# Patient Record
Sex: Male | Born: 1955 | Race: White | Hispanic: No | Marital: Married | Smoking: Former smoker
Health system: Southern US, Community
[De-identification: ages and names within clinical notes are randomized; demographics above are authoritative.]

## PROBLEM LIST (undated history)

## (undated) DIAGNOSIS — I878 Other specified disorders of veins: Secondary | ICD-10-CM

## (undated) DIAGNOSIS — I639 Cerebral infarction, unspecified: Secondary | ICD-10-CM

## (undated) DIAGNOSIS — K5792 Diverticulitis of intestine, part unspecified, without perforation or abscess without bleeding: Secondary | ICD-10-CM

## (undated) DIAGNOSIS — G8250 Quadriplegia, unspecified: Secondary | ICD-10-CM

## (undated) DIAGNOSIS — N189 Chronic kidney disease, unspecified: Secondary | ICD-10-CM

## (undated) DIAGNOSIS — K651 Peritoneal abscess: Secondary | ICD-10-CM

## (undated) DIAGNOSIS — G709 Myoneural disorder, unspecified: Secondary | ICD-10-CM

## (undated) DIAGNOSIS — C801 Malignant (primary) neoplasm, unspecified: Secondary | ICD-10-CM

## (undated) DIAGNOSIS — R112 Nausea with vomiting, unspecified: Secondary | ICD-10-CM

## (undated) DIAGNOSIS — Z9889 Other specified postprocedural states: Secondary | ICD-10-CM

## (undated) DIAGNOSIS — A0472 Enterocolitis due to Clostridium difficile, not specified as recurrent: Secondary | ICD-10-CM

## (undated) DIAGNOSIS — Z860100 Personal history of colon polyps, unspecified: Secondary | ICD-10-CM

## (undated) DIAGNOSIS — IMO0002 Reserved for concepts with insufficient information to code with codable children: Secondary | ICD-10-CM

## (undated) DIAGNOSIS — J189 Pneumonia, unspecified organism: Secondary | ICD-10-CM

## (undated) DIAGNOSIS — R7303 Prediabetes: Secondary | ICD-10-CM

## (undated) DIAGNOSIS — D509 Iron deficiency anemia, unspecified: Secondary | ICD-10-CM

## (undated) DIAGNOSIS — K9403 Colostomy malfunction: Secondary | ICD-10-CM

## (undated) DIAGNOSIS — K922 Gastrointestinal hemorrhage, unspecified: Secondary | ICD-10-CM

## (undated) DIAGNOSIS — K269 Duodenal ulcer, unspecified as acute or chronic, without hemorrhage or perforation: Secondary | ICD-10-CM

## (undated) DIAGNOSIS — I10 Essential (primary) hypertension: Secondary | ICD-10-CM

## (undated) DIAGNOSIS — T8131XA Disruption of external operation (surgical) wound, not elsewhere classified, initial encounter: Secondary | ICD-10-CM

## (undated) DIAGNOSIS — D352 Benign neoplasm of pituitary gland: Secondary | ICD-10-CM

## (undated) HISTORY — DX: Diverticulitis of intestine, part unspecified, without perforation or abscess without bleeding: K57.92

## (undated) HISTORY — DX: Benign neoplasm of pituitary gland: D35.2

## (undated) HISTORY — DX: Reserved for concepts with insufficient information to code with codable children: IMO0002

## (undated) HISTORY — DX: Cerebral infarction, unspecified: I63.9

## (undated) HISTORY — DX: Other specified disorders of veins: I87.8

## (undated) HISTORY — PX: CHOLECYSTECTOMY: SHX55

## (undated) HISTORY — DX: Iron deficiency anemia, unspecified: D50.9

## (undated) HISTORY — DX: Essential (primary) hypertension: I10

## (undated) HISTORY — DX: Personal history of colon polyps, unspecified: Z86.0100

## (undated) HISTORY — DX: Personal history of colonic polyps: Z86.010

## (undated) HISTORY — DX: Colostomy malfunction: K94.03

## (undated) HISTORY — PX: OTHER SURGICAL HISTORY: SHX169

---

## 1982-08-11 HISTORY — PX: VASECTOMY: SHX75

## 1992-08-11 DIAGNOSIS — I639 Cerebral infarction, unspecified: Secondary | ICD-10-CM

## 1992-08-11 HISTORY — DX: Cerebral infarction, unspecified: I63.9

## 1993-02-08 HISTORY — PX: TRACHEOSTOMY TUBE PLACEMENT: SHX814

## 1993-02-08 HISTORY — PX: OTHER SURGICAL HISTORY: SHX169

## 2005-08-11 HISTORY — PX: PITUITARY SURGERY: SHX203

## 2005-08-11 LAB — HM COLONOSCOPY: HM Colonoscopy: NORMAL

## 2007-12-09 ENCOUNTER — Encounter: Admission: RE | Admit: 2007-12-09 | Discharge: 2007-12-09 | Payer: Self-pay | Admitting: Family Medicine

## 2008-05-29 ENCOUNTER — Encounter: Payer: Self-pay | Admitting: Family Medicine

## 2008-10-16 ENCOUNTER — Ambulatory Visit: Payer: Self-pay | Admitting: Family Medicine

## 2008-10-16 DIAGNOSIS — I639 Cerebral infarction, unspecified: Secondary | ICD-10-CM

## 2008-10-16 DIAGNOSIS — L218 Other seborrheic dermatitis: Secondary | ICD-10-CM

## 2008-10-16 DIAGNOSIS — N319 Neuromuscular dysfunction of bladder, unspecified: Secondary | ICD-10-CM | POA: Insufficient documentation

## 2008-10-16 DIAGNOSIS — I10 Essential (primary) hypertension: Secondary | ICD-10-CM

## 2008-10-16 DIAGNOSIS — Z860100 Personal history of colon polyps, unspecified: Secondary | ICD-10-CM | POA: Insufficient documentation

## 2008-10-16 HISTORY — DX: Cerebral infarction, unspecified: I63.9

## 2009-01-15 ENCOUNTER — Ambulatory Visit: Payer: Self-pay | Admitting: Family Medicine

## 2009-01-15 DIAGNOSIS — R609 Edema, unspecified: Secondary | ICD-10-CM

## 2009-01-15 DIAGNOSIS — D497 Neoplasm of unspecified behavior of endocrine glands and other parts of nervous system: Secondary | ICD-10-CM

## 2009-01-16 LAB — CONVERTED CEMR LAB
BUN: 12 mg/dL (ref 6–23)
CO2: 29 meq/L (ref 19–32)
Calcium: 9.3 mg/dL (ref 8.4–10.5)
Chloride: 108 meq/L (ref 96–112)
Creatinine, Ser: 0.7 mg/dL (ref 0.4–1.5)
GFR calc non Af Amer: 124.76 mL/min (ref >60)
Glucose, Bld: 144 mg/dL — ABNORMAL HIGH (ref 70–99)
Potassium: 4 meq/L (ref 3.5–5.1)
Sodium: 141 meq/L (ref 135–145)

## 2009-01-18 ENCOUNTER — Encounter: Payer: Self-pay | Admitting: Family Medicine

## 2009-01-19 ENCOUNTER — Encounter: Admission: RE | Admit: 2009-01-19 | Discharge: 2009-01-19 | Payer: Self-pay | Admitting: Family Medicine

## 2009-02-07 ENCOUNTER — Encounter: Payer: Self-pay | Admitting: Family Medicine

## 2009-03-02 ENCOUNTER — Encounter: Payer: Self-pay | Admitting: Family Medicine

## 2009-04-18 ENCOUNTER — Ambulatory Visit: Payer: Self-pay | Admitting: Family Medicine

## 2009-04-19 LAB — CONVERTED CEMR LAB: Glucose, Bld: 116 mg/dL — ABNORMAL HIGH (ref 70–99)

## 2009-05-14 ENCOUNTER — Ambulatory Visit: Payer: Self-pay | Admitting: Family Medicine

## 2009-05-16 ENCOUNTER — Encounter: Payer: Self-pay | Admitting: Family Medicine

## 2009-07-31 ENCOUNTER — Telehealth: Payer: Self-pay | Admitting: Family Medicine

## 2009-08-30 ENCOUNTER — Encounter: Payer: Self-pay | Admitting: Family Medicine

## 2009-11-12 ENCOUNTER — Ambulatory Visit: Payer: Self-pay | Admitting: Family Medicine

## 2009-11-15 LAB — CONVERTED CEMR LAB
Calcium: 9.2 mg/dL (ref 8.4–10.5)
GFR calc non Af Amer: 148.6 mL/min (ref >60)
Glucose, Bld: 120 mg/dL — ABNORMAL HIGH (ref 70–99)
Potassium: 3.7 meq/L (ref 3.5–5.1)
Sodium: 139 meq/L (ref 135–145)

## 2010-01-31 ENCOUNTER — Telehealth: Payer: Self-pay | Admitting: Family Medicine

## 2010-02-01 ENCOUNTER — Ambulatory Visit: Payer: Self-pay | Admitting: Family Medicine

## 2010-02-01 LAB — CONVERTED CEMR LAB
Bilirubin Urine: NEGATIVE
Blood in Urine, dipstick: NEGATIVE
Ketones, urine, test strip: NEGATIVE
Nitrite: NEGATIVE
Specific Gravity, Urine: 1.015
pH: 7

## 2010-02-28 ENCOUNTER — Telehealth: Payer: Self-pay | Admitting: Family Medicine

## 2010-03-01 ENCOUNTER — Ambulatory Visit: Payer: Self-pay | Admitting: Family Medicine

## 2010-03-08 ENCOUNTER — Ambulatory Visit: Payer: Self-pay | Admitting: Family Medicine

## 2010-03-08 LAB — CONVERTED CEMR LAB
BUN: 18 mg/dL (ref 6–23)
Chloride: 97 meq/L (ref 96–112)
GFR calc non Af Amer: 118.36 mL/min (ref >60)
Potassium: 3.8 meq/L (ref 3.5–5.1)
Sodium: 133 meq/L — ABNORMAL LOW (ref 135–145)

## 2010-04-03 ENCOUNTER — Ambulatory Visit: Payer: Self-pay | Admitting: Family Medicine

## 2010-04-03 DIAGNOSIS — R29810 Facial weakness: Secondary | ICD-10-CM

## 2010-04-03 DIAGNOSIS — R5383 Other fatigue: Secondary | ICD-10-CM

## 2010-04-03 DIAGNOSIS — R5381 Other malaise: Secondary | ICD-10-CM

## 2010-04-04 ENCOUNTER — Encounter: Admission: RE | Admit: 2010-04-04 | Discharge: 2010-04-04 | Payer: Self-pay | Admitting: Family Medicine

## 2010-04-04 ENCOUNTER — Encounter: Payer: Self-pay | Admitting: Family Medicine

## 2010-04-04 LAB — CONVERTED CEMR LAB
AST: 21 units/L (ref 0–37)
Albumin: 3.4 g/dL — ABNORMAL LOW (ref 3.5–5.2)
Alkaline Phosphatase: 81 units/L (ref 39–117)
Basophils Absolute: 0.1 10*3/uL (ref 0.0–0.1)
Basophils Relative: 0.5 % (ref 0.0–3.0)
CO2: 27 meq/L (ref 19–32)
Calcium: 9.3 mg/dL (ref 8.4–10.5)
Chloride: 101 meq/L (ref 96–112)
Eosinophils Absolute: 0.1 10*3/uL (ref 0.0–0.7)
Glucose, Bld: 123 mg/dL — ABNORMAL HIGH (ref 70–99)
HCT: 37.9 % — ABNORMAL LOW (ref 39.0–52.0)
Hemoglobin: 12.9 g/dL — ABNORMAL LOW (ref 13.0–17.0)
Lymphocytes Relative: 9.5 % — ABNORMAL LOW (ref 12.0–46.0)
Lymphs Abs: 1.2 10*3/uL (ref 0.7–4.0)
MCHC: 33.9 g/dL (ref 30.0–36.0)
MCV: 92.1 fL (ref 78.0–100.0)
Neutro Abs: 9.9 10*3/uL — ABNORMAL HIGH (ref 1.4–7.7)
Potassium: 3.7 meq/L (ref 3.5–5.1)
RBC: 4.12 M/uL — ABNORMAL LOW (ref 4.22–5.81)
RDW: 16.1 % — ABNORMAL HIGH (ref 11.5–14.6)
Sodium: 138 meq/L (ref 135–145)
TSH: 0.88 microintl units/mL (ref 0.35–5.50)
Total Protein: 6.6 g/dL (ref 6.0–8.3)
Vitamin B-12: 524 pg/mL (ref 211–911)

## 2010-04-10 ENCOUNTER — Encounter: Payer: Self-pay | Admitting: Family Medicine

## 2010-04-12 ENCOUNTER — Telehealth: Payer: Self-pay | Admitting: Family Medicine

## 2010-05-01 ENCOUNTER — Encounter: Payer: Self-pay | Admitting: Family Medicine

## 2010-05-11 HISTORY — PX: OTHER SURGICAL HISTORY: SHX169

## 2010-05-21 ENCOUNTER — Ambulatory Visit: Payer: Self-pay | Admitting: Family Medicine

## 2010-05-21 DIAGNOSIS — J209 Acute bronchitis, unspecified: Secondary | ICD-10-CM | POA: Insufficient documentation

## 2010-06-06 ENCOUNTER — Encounter: Payer: Self-pay | Admitting: Family Medicine

## 2010-06-21 ENCOUNTER — Ambulatory Visit: Payer: Self-pay | Admitting: Family Medicine

## 2010-06-21 DIAGNOSIS — R7989 Other specified abnormal findings of blood chemistry: Secondary | ICD-10-CM

## 2010-06-26 ENCOUNTER — Telehealth: Payer: Self-pay | Admitting: Family Medicine

## 2010-07-03 ENCOUNTER — Encounter: Payer: Self-pay | Admitting: Family Medicine

## 2010-07-22 ENCOUNTER — Ambulatory Visit: Payer: Self-pay | Admitting: Family Medicine

## 2010-08-23 ENCOUNTER — Ambulatory Visit
Admission: RE | Admit: 2010-08-23 | Discharge: 2010-08-23 | Payer: Self-pay | Source: Home / Self Care | Attending: Family Medicine | Admitting: Family Medicine

## 2010-09-01 ENCOUNTER — Encounter: Payer: Self-pay | Admitting: Family Medicine

## 2010-09-07 ENCOUNTER — Encounter: Payer: Self-pay | Admitting: Family Medicine

## 2010-09-07 ENCOUNTER — Telehealth: Payer: Self-pay | Admitting: *Deleted

## 2010-09-09 ENCOUNTER — Telehealth: Payer: Self-pay | Admitting: *Deleted

## 2010-09-09 NOTE — Telephone Encounter (Signed)
error 

## 2010-09-10 NOTE — Progress Notes (Signed)
Summary: Trazadone with Testosterone concerns  Phone Note Call from Patient   Caller: Spouse Call For: Evelena Peat MD Summary of Call: Insurance and pharmacy wanted to double with Dr Caryl Never re: taking Trazadone with the new Testosterone, possible drug interaction? [facility] would like a call back as well as wife Initial call taken by: Sid Falcon LPN,  June 26, 2010 3:33 PM  Follow-up for Phone Call        Spoke with pt's wife. I have done a search for interactions and do not see any signif contraindication to him using trazodone at this time with testosterone.  They will cont with both drugs. Follow-up by: Evelena Peat MD,  June 26, 2010 5:43 PM

## 2010-09-10 NOTE — Assessment & Plan Note (Signed)
Summary: blisters on legs    KIK   Vital Signs:  Patient profile:   55 year old male Temp:     98 degrees F oral BP sitting:   120 / 80  (left arm) Cuff size:   regular  Vitals Entered By: Sid Falcon LPN (March 01, 2010 2:07 PM) CC: blisters on legs, Hypertension Management   History of Present Illness: Patient seen with some blisters on his lower legs. Has history of dependent venous stasis edema. Recently noticed some weeping and small blisters on both legs. Nonpainful. Does not elevate legs very often. Has compression hose but not consistently using. Takes Lasix currently one daily. No dyspnea, orthopnea, or PND.  No dietary changes.  Hypertension treated with lisinopril HCTZ.  pt compliant with therapy.  Hypertension History:      He complains of peripheral edema, but denies headache, chest pain, palpitations, dyspnea with exertion, orthopnea, neurologic problems, syncope, and side effects from treatment.  He notes no problems with any antihypertensive medication side effects.        Positive major cardiovascular risk factors include male age 33 years old or older and hypertension.     Allergies: 1)  ! 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 Lungs:  Normal respiratory effort, chest expands symmetrically. Lungs are clear to auscultation, no crackles or wheezes. Heart:  normal rate and regular rhythm.   Extremities:  patient has 2+ pitting edema lower legs bilaterally. He has some small clear vesicles on both legs with some mild weeping. No ulcerations. Good capillary  refill Skin:  no suspicious lesions, no petechiae, and no ulcerations.   Psych:  normally interactive, good eye contact, not anxious appearing, and not depressed appearing.     Impression & Recommendations:  Problem # 1:  EDEMA (ICD-782.3) Assessment Deteriorated increase furosemide to 40 mg one b.i.d. Add low-dose potassium. Increase elevation and compression stockings. Reassess in one week His updated medication list for this problem includes:    Lisinopril-hydrochlorothiazide 10-12.5 Mg Tabs (Lisinopril-hydrochlorothiazide) ..... Once daily    Furosemide 40 Mg Tabs (Furosemide) ..... One to two tabs by mouth as needed  Problem # 2:  HYPERTENSION (ICD-401.9) Assessment: Unchanged  His updated medication list for this problem includes:    Lisinopril-hydrochlorothiazide 10-12.5 Mg Tabs (Lisinopril-hydrochlorothiazide) ..... Once daily    Furosemide 40 Mg Tabs (Furosemide) ..... One to two tabs by mouth as needed  Complete Medication List: 1)  Lexapro 10 Mg Tabs (Escitalopram oxalate) .... Once daily 2)  Nitrofurantoin Macrocrystal 100 Mg Caps (Nitrofurantoin macrocrystal) .... Once daily 3)  Oxybutynin Chloride 5 Mg Xr24h-tab (Oxybutynin chloride) .... One tab two times a day 4)  Dyflex-g 200-200 Mg Tabs (Dyphylline-guaifenesin) 5)  Trazodone Hcl 100 Mg Tabs (Trazodone hcl) .... Once daily 6)  Lisinopril-hydrochlorothiazide 10-12.5 Mg Tabs (Lisinopril-hydrochlorothiazide) .... Once daily 7)  Adprin B 325 Mg Tabs (Aspirin buf(cacarb-mgcarb-mgo)) .... Qd 8)  Centrum Silver Ultra Mens Tabs (Multiple vitamins-minerals) .... Once daily 9)  Furosemide 40 Mg Tabs (Furosemide) .... One to two tabs by mouth as needed 10)  Triamcinolone Acetonide 0.1 % Crea (Triamcinolone acetonide) .... Apply to affected area two times a day as needed  11)  Plavix 75 Mg Tabs (Clopidogrel bisulfate) .... One by mouth once daily 12)  Potassium Chloride Crys Cr 20 Meq Cr-tabs (Potassium chloride crys cr) .... One  by mouth once daily  Hypertension Assessment/Plan:      The patient's hypertensive risk group is category B: At least one risk factor (excluding diabetes) with no target organ damage.  Today's blood pressure is 120/80.    Patient Instructions: 1)  Schedule followup in one week 2)  Elevated legs and feet frequently 3)  Continue compression stockings 4)  Increase furosemide to 40 mg twice daily Prescriptions: POTASSIUM CHLORIDE CRYS CR 20 MEQ CR-TABS (POTASSIUM CHLORIDE CRYS CR) one by mouth once daily  #30 x 3   Entered and Authorized by:   Evelena Peat MD   Signed by:   Evelena Peat MD on 03/01/2010   Method used:   Electronically to        [facility]  [phone]* (retail)       [address]       [place], [place]  [address]       Ph: [phone] or [phone]       Fax: [phone]   RxID:   [id number]

## 2010-09-10 NOTE — Assessment & Plan Note (Signed)
Summary: 6 month ov//ccm RSC BMP OK PER WIFE/NJR----PTS WIFE RSC (BMP)...   Vital Signs:  Patient profile:   55 year old male Temp:     97.5 degrees F oral BP sitting:   120 / 82  (left arm) Cuff size:   regular  Vitals Entered By: Sid Falcon LPN (May 21, 2010 10:54 AM)  History of Present Illness: Cough prod of thick discolored sputum for few days. Some blood noted tinged in sputum.  No fever yet. No dyspnea.  Some wheezing.  nonsmoker.  Pituitary tumor and gamma knife surgery next week at [facility]. Wife reports his testosterone levels were very low around 49. He has some chronic fatigue.  They would like to address starting replacement after his  gamma knife surgery.  Allergies: 1)  ! Penicillin V Potassium (Penicillin V Potassium)  Past History:  Past Surgical History: Last updated: 01/15/2009 Trac 02/1993, removed 04/1993 Stomach peg 02/1993, removed 5-6 years later 2007 pituitary tumor  Family History: Last updated: 10/16/2008 Family History of Alcoholism/Addiction Family History of Arthritis Family History of Colon CA 1st degree relative <60  brother Family History High cholesterol Family History Hypertension Family History of Stroke F 1st degree relative <60 Throat Cancer father  Social History: Last updated: 10/16/2008 Retired, disabled 1994 Quadreplegic Married Alcohol use-yes, rarely  Past Medical History: Colonic polyps, hx of Hypertension Cerebrovascular accident, 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  Otoscopic examination reveals clear canals, tympanic membranes are intact  bilaterally without bulging, retraction, inflammation or discharge. Hearing is grossly normal bilaterally. Mouth:  Oral mucosa and oropharynx without lesions or exudates.  Teeth in good repair. Neck:  No deformities, masses, or tenderness noted. Lungs:  Normal respiratory effort, chest expands symmetrically. Lungs are clear to auscultation, no crackles or wheezes. Heart:  normal rate and regular rhythm.     Impression & Recommendations:  Problem # 1:  BRONCHITIS, ACUTE (ICD-466.0) Assessment New  The following medications were removed from the medication list:    Ciprofloxacin Hcl 500 Mg Tabs (Ciprofloxacin hcl) ..... One by mouth two times a day for 7 days His updated medication list for this problem includes:    Dyflex-g 200-200 Mg Tabs (Dyphylline-guaifenesin)    Azithromycin 250 Mg Tabs (Azithromycin) .Marland Kitchen... 2 by mouth today then one by mouth once daily ofr 4 days.  Problem # 2:  Preventive Health Care (ICD-V70.0) flu vaccine recommended and given.  Complete Medication List: 1)  Lexapro 10 Mg Tabs (Escitalopram oxalate) .... Once daily 2)  Nitrofurantoin Macrocrystal 100 Mg Caps (Nitrofurantoin macrocrystal) .... Once daily 3)  Oxybutynin Chloride 5 Mg Xr24h-tab (Oxybutynin chloride) .... One tab two times a day 4)  Dyflex-g 200-200 Mg Tabs (Dyphylline-guaifenesin) 5)  Trazodone Hcl 100 Mg Tabs (Trazodone hcl) .... Once daily 6)  Lisinopril-hydrochlorothiazide 10-12.5 Mg Tabs (Lisinopril-hydrochlorothiazide) .... Once daily 7)  Adprin B 325 Mg Tabs (Aspirin buf(cacarb-mgcarb-mgo)) .... Qd 8)  Centrum Silver Ultra Mens Tabs (Multiple vitamins-minerals) .... Once daily 9)  Furosemide 40 Mg Tabs (Furosemide) .... One to two tabs by mouth as needed 10)  Triamcinolone Acetonide 0.1 % Crea (Triamcinolone acetonide) .... Apply to affected area two times a day as needed 11)  Plavix 75 Mg Tabs (Clopidogrel bisulfate) .... One by mouth once daily 12)  Potassium Chloride Crys Cr 20 Meq  Cr-tabs (Potassium chloride crys cr) .... One by mouth once daily 13)  Azithromycin 250 Mg Tabs (Azithromycin) .... 2 by mouth today then one by mouth once daily ofr 4 days.  Other Orders: Flu Vaccine 41yrs + MEDICARE PATIENTS (Z6109) Administration Flu vaccine - MCR (U0454)  Patient Instructions: 1)  Acute Bronchitis symptoms for less then 10 days are not  helped by antibiotics. Take over the counter cough medications. Call if no improvement in 5-7 days, sooner if increasing cough, fever, or new symptoms ( shortness of breath, chest pain) .  2)  Please schedule a follow-up appointment in 1 month.  Prescriptions: AZITHROMYCIN 250 MG TABS (AZITHROMYCIN) 2 by mouth today then one by mouth once daily ofr 4 days.  #6 x 0   Entered and Authorized by:   Evelena Peat MD   Signed by:   Evelena Peat MD on 05/21/2010   Method used:   Electronically to        [facility]  [phone]* (retail)       [address]       [place], [place]  [address]       Ph: [phone] or [phone]       Fax: [phone]   RxID:   [id number]     Flu Vaccine Consent Questions     Do you have a history of severe allergic reactions to this vaccine? no    Any prior history of allergic reactions to egg and/or gelatin? no    Do you have a sensitivity to the preservative Thimersol? no    Do you have a past history of Guillan-Barre Syndrome? no    Do you currently have an acute febrile illness? no    Have you ever had a severe reaction to latex? no    Vaccine information given and explained to patient? yes    Are you currently pregnant? no    Lot Number:[id number]   Exp Date:02/08/2011   Site Given  Left Deltoid IMdflu

## 2010-09-10 NOTE — Assessment & Plan Note (Signed)
Summary: 1 MONTH FOLLOW UP/[facility]   Vital Signs:  Patient profile:   55 year old male Temp:     97.7 degrees F oral BP sitting:   120 / 78  (left arm) Cuff size:   regular  Vitals Entered By: Sid Falcon LPN (June 21, 2010 10:04 AM)  History of Present Illness: Hx hypogonadism.  Hx pituitary adenoma.  Testosterone level recently 51 at [facility]. Gamma knife went well.  Here to discuss replacement therapy at this time. Severe fatigue issues now for several months with normal TSH and B12 levels.  No progressive CNS symptoms.  No headache.   BP stable .Marland Kitchen   Compliant with meds.  No side effects.   Hypertension History:      He complains of peripheral edema, but denies headache, chest pain, palpitations, syncope, and side effects from treatment.        Positive major cardiovascular risk factors include male age 40 years old or older and hypertension.     Allergies: 1)  ! 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  The patient denies anorexia, fever, weight loss, vision loss, decreased hearing, chest pain, syncope, headaches, abdominal pain, and depression.         peripheral edema unchanged.  Physical Exam  General:  Well-developed,well-nourished,in no acute distress; alert,appropriate and cooperative throughout examination Head:  Normocephalic and atraumatic without obvious abnormalities. No apparent  alopecia or balding. Eyes:  Still has some R eyelid droop c/w L. Ears:  External ear exam shows no significant lesions or deformities.  Otoscopic examination reveals clear canals, tympanic membranes are intact bilaterally without bulging, retraction, inflammation or discharge. Hearing is grossly normal bilaterally. Mouth:  Oral mucosa and oropharynx without lesions or exudates.  Teeth in good repair. Neck:  No deformities, masses, or tenderness noted. Lungs:  Normal respiratory effort, chest expands symmetrically. Lungs are clear to auscultation, no crackles or wheezes. Heart:  Normal rate and regular rhythm. S1 and S2 normal without gallop, murmur, click, rub or other extra sounds.   Impression & Recommendations:  Problem # 1:  HYPOGONADISM (ICD-257.2) discussed options for replacemtn with pt.  He has profound fatigue and is very interested in replacement. Get PSA.  Get labs from [facility] (copy of).  AFter much discussion, will start Testosterone IM.  Also reviewed other options. Will plan repeat testosterone level in about 2 months. Orders: Venipuncture ([address]109) Specimen Handling (60454) Admin of patients own med IM/SQ (09811B) TLB-PSA (Prostate Specific Antigen) (84153-PSA)  Problem # 2:  PITUITARY NEOPLASM, HX OF (ICD-V10.88)  recent gamma knife surgery at [facility] and pt has been given hope for good prognosis following his treatment.  Problem # 3:  HYPERTENSION (ICD-401.9)  His updated medication list for this problem includes:    Lisinopril-hydrochlorothiazide 10-12.5 Mg Tabs (Lisinopril-hydrochlorothiazide) ..... Once daily    Furosemide 40 Mg Tabs (Furosemide) ..... One to two tabs by mouth as needed  Complete Medication List: 1)  Lexapro 10 Mg Tabs (Escitalopram oxalate) .... Once daily 2)  Nitrofurantoin Macrocrystal  100 Mg Caps (Nitrofurantoin macrocrystal) .... Once daily 3)  Oxybutynin Chloride 5 Mg Xr24h-tab (Oxybutynin chloride) .... One tab two times a day 4)   Trazodone Hcl 100 Mg Tabs (Trazodone hcl) .... Once daily 5)  Lisinopril-hydrochlorothiazide 10-12.5 Mg Tabs (Lisinopril-hydrochlorothiazide) .... Once daily 6)  Adprin B 325 Mg Tabs (Aspirin buf(cacarb-mgcarb-mgo)) .... Qd 7)  Centrum Silver Ultra Mens Tabs (Multiple vitamins-minerals) .... Once daily 8)  Furosemide 40 Mg Tabs (Furosemide) .... One to two tabs by mouth as needed 9)  Triamcinolone Acetonide 0.1 % Crea (Triamcinolone acetonide) .... Apply to affected area two times a day as needed 10)  Plavix 75 Mg Tabs (Clopidogrel bisulfate) .... One by mouth once daily 11)  Potassium Chloride Crys Cr 20 Meq Cr-tabs (Potassium chloride crys cr) .... One by mouth once daily 12)  Testosterone Cypionate 200 Mg/ml Oil (Testosterone cypionate) .Marland Kitchen.. 1 cc im once monthly.  Hypertension Assessment/Plan:      The patient's hypertensive risk group is category B: At least one risk factor (excluding diabetes) with no target organ damage.  Today's blood pressure is 120/78.   Prescriptions: TESTOSTERONE CYPIONATE 200 MG/ML OIL (TESTOSTERONE CYPIONATE) 1 cc IM once monthly.  #10 ml x 0   Entered and Authorized by:   Evelena Peat MD   Signed by:   Evelena Peat MD on 06/21/2010   Method used:   Print then Give to Patient   RxID:   [id number]    Medication Administration  Injection # 1:    Medication: Testosterone Cypionat 200mg  ing    Diagnosis: HYPOGONADISM (ICD-257.2)    Route: IM    Site: RUOQ gluteus    Exp Date: 12/10/2011    Lot #: [id number]    Mfr: Gaylyn Rong    Patient tolerated injection without complications    Given by: Sid Falcon LPN (June 21, 2010 3:26 PM)  Orders Added: 1)  Venipuncture [33295] 2)  Specimen Handling [99000] 3)  Admin of patients own med IM/SQ [96372M] 4)  TLB-PSA (Prostate Specific Antigen) [18841-YSA] 5)  Est. Patient Level IV [63016]

## 2010-09-10 NOTE — Medication Information (Signed)
Summary: Order for Medical Supplies  Order for Medical Supplies   Imported By: Maryln Gottron 08/31/2009 14:57:27  _____________________________________________________________________  External Attachment:    Type:   Image     Comment:   External Document

## 2010-09-10 NOTE — Progress Notes (Signed)
Summary: leg injury  Phone Note Call from Patient Call back at Home Phone [phone]   Caller: wife,gloria Summary of Call: Injured leg last week, ran into bed.   left calf all red & warm to touch,  redness 5-6 inches long, darker in center, no look of infection or any break - none.  Hurts constantly like a toothache.  Tylenol.  Takes ASA daily & Plavix.   Putting leg up on.   Antibiotic Cipro for bladder infection Today is 7th day.  She did get another round if you want him to take.   Urine still cloudy.  No other discomfort.  No temp that she is aware of.  Should she ice leg?  Any other advice.   So many appts/so much with all that he doesn't want to come in.   Initial call taken by: Rudy Jew, RN,  April 12, 2010 9:30 AM  Follow-up for Phone Call        My only concern with icing is not knowing whether he could have some cellulitis.  I would continue elevation and consider refilling Cipro.  Difficult to comment further without seeing leg.  If worsens needs to be seen. Follow-up by: Evelena Peat MD,  April 12, 2010 9:43 AM  Additional Follow-up for Phone Call Additional follow up Details #1::        Phone Call Completed Additional Follow-up by: Rudy Jew, RN,  April 12, 2010 10:12 AM

## 2010-09-10 NOTE — Assessment & Plan Note (Signed)
Summary: 1 week fup//ccm   Vital Signs:  Patient profile:   55 year old male Temp:     71 degrees F oral  Vitals Entered By: Sid Falcon LPN (March 08, 2010 9:19 AM) CC: 1 week follow-up   History of Present Illness: Follow up edema lower extremities likely due to venous stasis. Increased Lasix 40 mg b.i.d. Edema somewhat improved. Elevating feet frequently. Edema was early morning. Using support hose. No new blisters. Small ulcer posterior right leg.  patient has no dyspnea or orthopnea.  Allergies: 1)  ! Penicillin V Potassium (Penicillin V Potassium)  Past History:  Past Medical History: Last updated: 01/15/2009 Colonic polyps, hx of Hypertension Cerebrovascular accident, hx of brainstem stroke 1994 Hx pituitary adenoma  Review of Systems       The patient complains of peripheral edema.  The patient denies chest pain, syncope, and abdominal pain.    Physical Exam  General:  Well-developed,well-nourished,in no acute distress; alert,appropriate and cooperative throughout examination Neck:  No deformities, masses, or tenderness noted. Lungs:  Normal respiratory effort, chest expands symmetrically. Lungs are clear to auscultation, no crackles or wheezes. Heart:  Normal rate and regular rhythm. S1 and S2 normal without gallop, murmur, click, rub or other extra sounds. Extremities:  patient still has 2+ pitting edema lower extremities. Very superficial ulcer right posterior leg with no signs of secondary infection. No other ulcerations noted. No erythema or signs of secondary infection   Impression & Recommendations:  Problem # 1:  EDEMA (ICD-782.3)  minimally improved. Continue furosemide 40 mg b.i.d. Check basic metabolic panel His updated medication list for this problem includes:    Lisinopril-hydrochlorothiazide 10-12.5 Mg Tabs (Lisinopril-hydrochlorothiazide) ..... Once daily    Furosemide 40 Mg Tabs (Furosemide) ..... One to two tabs by mouth as  needed  Orders: TLB-BMP (Basic Metabolic Panel-BMET) (80048-METABOL) Venipuncture (16109) Specimen Handling (60454)  Complete Medication List: 1)  Lexapro 10 Mg Tabs (Escitalopram oxalate) .... Once daily 2)  Nitrofurantoin Macrocrystal 100 Mg Caps (Nitrofurantoin macrocrystal) .... Once daily 3)  Oxybutynin Chloride 5 Mg Xr24h-tab (Oxybutynin chloride) .... One tab two times a day 4)  Dyflex-g 200-200 Mg Tabs (Dyphylline-guaifenesin) 5)  Trazodone Hcl 100 Mg Tabs (Trazodone hcl) .... Once daily 6)  Lisinopril-hydrochlorothiazide 10-12.5 Mg Tabs (Lisinopril-hydrochlorothiazide) .... Once daily 7)  Adprin B 325 Mg Tabs (Aspirin buf(cacarb-mgcarb-mgo)) .... Qd 8)  Centrum Silver Ultra Mens Tabs (Multiple vitamins-minerals) .... Once daily 9)  Furosemide 40 Mg Tabs (Furosemide) .... One to two tabs by mouth as needed 10)  Triamcinolone Acetonide 0.1 % Crea (Triamcinolone acetonide) .... Apply to affected area two times a day as needed 11)  Plavix 75 Mg Tabs (Clopidogrel bisulfate) .... One by mouth once daily 12)  Potassium Chloride Crys Cr 20 Meq Cr-tabs (Potassium chloride crys cr) .... One by mouth once daily  Patient Instructions: 1)  continue frequent elevation of legs and feet 2)  Continued support hose 3)  Please schedule a follow-up appointment in 2 weeks.

## 2010-09-10 NOTE — Medication Information (Signed)
Summary: Order for [facility]  Order for [facility]   Imported By: Maryln Gottron 07/10/2010 14:07:11  _____________________________________________________________________  External Attachment:    Type:   Image     Comment:   External Document

## 2010-09-10 NOTE — Assessment & Plan Note (Signed)
Summary: 6 month rov/njr   Vital Signs:  Patient profile:   55 year old male Temp:     98.6 degrees F oral BP sitting:   130 / 90  (left arm) Cuff size:   regular  Vitals Entered By: Sid Falcon LPN (November 12, 1608 9:48 AM) CC: 6 month follow-up, med refills, Hypertension Management   History of Present Illness: Followup multiple medical problems. Patient has history of CVA, neurogenic bladder, prediabetes, benign pituitary neoplasm, chronic lower extremity edema, and hypertension. He is wheelchair-bound. Very supportive family.  Needs refills of several medications. History of eczema mostly on the face and has used triamcinolone cream and needs refills. Also history of recurrent tinea cruris treated with ketoconazole cream and requesting refills of that.  Chronic insomnia with sleeping well trazodone. Depression stable on Lexapro.  Neurogenic bladder. No recent urinary tract infection. Maintained on nitrofurantoin for prophylaxis.  Positive family history colon cancer Brother died age 32. Previous colonoscopy revealed adenomatous colon polyp. Most recent colonoscopy 2007 and normal. Needs repeat next year. Last tetanus unknown. Prior Pneumovax x2 date unknown.  Hypertension History:      He complains of peripheral edema, but denies headache, chest pain, palpitations, dyspnea with exertion, visual symptoms, syncope, and side effects from treatment.        Positive major cardiovascular risk factors include male age 58 years old or older and hypertension.     Allergies: 1)  ! Penicillin V Potassium (Penicillin V Potassium)  Past History:  Past Medical History: Last updated: 01/15/2009 Colonic polyps, hx of Hypertension Cerebrovascular accident, hx of brainstem stroke 1994 Hx pituitary adenoma  Family History: Last updated: 10/16/2008 Family History of Alcoholism/Addiction Family History of Arthritis Family History of Colon CA 1st degree relative <60  brother Family  History High cholesterol Family History Hypertension Family History of Stroke F 1st degree relative <60 Throat Cancer father  Social History: Last updated: 10/16/2008 Retired, disabled 1994 Quadreplegic Married Alcohol use-yes, rarely PMH-FH-SH reviewed for relevance  Review of Systems       The patient complains of peripheral edema.  The patient denies anorexia, fever, chest pain, syncope, prolonged cough, headaches, abdominal pain, melena, and hematochezia.    Physical Exam  General:  Well-developed,well-nourished,in no acute distress; alert,appropriate and cooperative throughout examination Head:  Normocephalic and atraumatic without obvious abnormalities. No apparent alopecia or balding. Mouth:  Oral mucosa and oropharynx without lesions or exudates.  Teeth in good repair. Neck:  No deformities, masses, or tenderness noted. Lungs:  Normal respiratory effort, chest expands symmetrically. Lungs are clear to auscultation, no crackles or wheezes. Heart:  normal rate and regular rhythm.   Extremities:  moderate pitting edema. Neurologic:  alert & oriented X3 and cranial nerves II-XII intact.   Skin:  few scattered seb keratoses.  Mild seborrea face. Cervical Nodes:  No lymphadenopathy noted Psych:  normally interactive, good eye contact, not anxious appearing, and not depressed appearing.     Impression & Recommendations:  Problem # 1:  PREDIABETES (ICD-790.29) work on weight  loss.  Problem # 2:  CEREBROVASCULAR ACCIDENT, HX OF (ICD-V12.50) refilled Plavix for 1 year.  Problem # 3:  HYPERTENSION (ICD-401.9)  His updated medication list for this problem includes:    Lisinopril-hydrochlorothiazide 10-12.5 Mg Tabs (Lisinopril-hydrochlorothiazide) ..... Once daily    Furosemide 40 Mg Tabs (Furosemide) ..... One to two tabs by mouth as needed  Orders: Venipuncture (96045) TLB-BMP (Basic Metabolic Panel-BMET) (80048-METABOL)  Problem # 4:  OTHER SEBORRHEIC DERMATITIS  (ICD-690.18)  triamcinolone cream.  Problem # 5:  aNEUROGENIC BLADDER (GNF-621.30)  Problem # 6:  Preventive Health Care (ICD-V70.0) tetanus given.  Complete Medication List: 1)  Lexapro 10 Mg Tabs (Escitalopram oxalate) .... Once daily 2)  Nitrofurantoin Macrocrystal 100 Mg Caps (Nitrofurantoin macrocrystal) .... Once daily 3)  Oxybutynin Chloride 5 Mg Xr24h-tab (Oxybutynin chloride) .... One tab two times a day 4)  Dyflex-g 200-200 Mg Tabs (Dyphylline-guaifenesin) 5)  Trazodone Hcl 100 Mg Tabs (Trazodone hcl) .... Once daily 6)  Lisinopril-hydrochlorothiazide 10-12.5 Mg Tabs (Lisinopril-hydrochlorothiazide) .... Once daily 7)  Adprin B 325 Mg Tabs (Aspirin buf(cacarb-mgcarb-mgo)) .... Qd 8)  Centrum Silver Ultra Mens Tabs (Multiple vitamins-minerals) .... Once daily 9)  Furosemide 40 Mg Tabs (Furosemide) .... One to two tabs by mouth as needed 10)  Triamcinolone Acetonide 0.1 % Crea (Triamcinolone acetonide) .... Apply to affected area two times a day as needed 11)  Ketoconazole 2 % Crea (Ketoconazole) .... Apply to affected area two times a day as needed 12)  Plavix 75 Mg Tabs (Clopidogrel bisulfate) .... One by mouth once daily  Other Orders: Tdap => 50yrs IM (86578) Admin 1st Vaccine (46962)  Hypertension Assessment/Plan:      The patient's hypertensive risk group is category B: At least one risk factor (excluding diabetes) with no target organ damage.  Today's blood pressure is 130/90.    Patient Instructions: 1)  Check your  Blood Pressure regularly . If it is above:140/90  you should make an appointment. 2)  Please schedule a follow-up appointment in 6 months .  Prescriptions: PLAVIX 75 MG TABS (CLOPIDOGREL BISULFATE) one by mouth once daily  #90 x 3   Entered and Authorized by:   Evelena Peat MD   Signed by:   Evelena Peat MD on 11/12/2009   Method used:   Electronically to        [facility]  [phone]* (retail)       [address]        [place], [place]  [address]       Ph: [phone] or [phone]       Fax: [phone]   RxID:   [id number] KETOCONAZOLE 2 % CREA (KETOCONAZOLE) apply to affected area two times a day as needed  #60 gms x 1   Entered and Authorized by:   Evelena Peat MD   Signed by:   Evelena Peat MD on 11/12/2009   Method used:   Electronically to        [facility]  [phone]* (retail)       [address]       [place], [place]  [address]       Ph: [phone] or [phone]       Fax: [phone]   RxID:   [id number] TRIAMCINOLONE ACETONIDE 0.1 % CREA (TRIAMCINOLONE ACETONIDE) apply to affected area two times a day as needed  #15 gm x 1   Entered and Authorized by:   Evelena Peat MD   Signed by:   Evelena Peat MD on 11/12/2009   Method used:   Electronically to        [facility]  [phone]* (retail)       [address]       [place], [place]  [address]       Ph: [phone] or [phone]  Fax: [phone]   RxID:   [id number]      Immunizations Administered:  Tetanus Vaccine:    Vaccine Type: Tdap    Site: right deltoid    Mfr: GlaxoSmithKline    Dose: 0.5 ml    Route: IM    Given by: Sid Falcon LPN    Exp. Date: 11/03/2011    Lot #: [id number]

## 2010-09-10 NOTE — Medication Information (Signed)
Summary: Order for Parts for Power Wheelchair  Order for Parts for Power Wheelchair   Imported By: Maryln Gottron 06/10/2010 12:49:32  _____________________________________________________________________  External Attachment:    Type:   Image     Comment:   External Document

## 2010-09-10 NOTE — Consult Note (Signed)
Summary:  [facility]uro[facility]  [facility]   Imported By: Maryln Gottron 05/01/2010 15:36:24  _____________________________________________________________________  External Attachment:    Type:   Image     Comment:   External Document

## 2010-09-10 NOTE — Progress Notes (Signed)
Summary: blisters on legs  Phone Note Call from Patient   Caller: Spouse Call For: Austin Peat MD Summary of Call: patient's wife is calling because he is having small blisters which pop on his legs.  should he come in for an office visit? Initial call taken by: Kern Reap CMA Duncan Dull),  February 28, 2010 4:02 PM  Follow-up for Phone Call        yes.  Would bring in to assess. Follow-up by: Austin Peat MD,  February 28, 2010 5:52 PM  Additional Follow-up for Phone Call Additional follow up Details #1::         spoke with wife - appt made to see today per Dr. Caryl Never. KIK Additional Follow-up by: Duard Brady LPN,  March 01, 2010 8:11 AM

## 2010-09-10 NOTE — Progress Notes (Signed)
Summary: Another bladder infection, UA ordered  Phone Note Call from Patient Call back at Home Phone [phone]   Caller: Otila Kluver Call For: Evelena Peat MD Summary of Call: VM from wife reporting pt has another bladder infection.  Does Dr Caryl Never want to have them bring by another urine speciman, or just call in another antibiotic, or does he need OV?  [facility] Initial call taken by: Sid Falcon LPN,  January 31, 2010 2:00 PM  Follow-up for Phone Call        We really need to culture since he does I/O caths sec to risk of resistent infection.  Can bring specimen if necessary but best to collect here if possible. Follow-up by: Evelena Peat MD,  January 31, 2010 5:33 PM  Additional Follow-up for Phone Call Additional follow up Details #1::        Wife has sterile bottles, she will clean catch first moring urine and bring to office, scheduled in lab. Additional Follow-up by: Sid Falcon LPN,  January 31, 2010 6:10 PM

## 2010-09-10 NOTE — Assessment & Plan Note (Signed)
Summary: weakness/dm   Vital Signs:  Patient profile:   55 year old male Temp:     97.8 degrees F BP sitting:   140 / 90  History of Present Illness: Patient is seen with complaints of weakness.  On questioning he has some generalized weakness which has been progressive possibly over several months. Also concern for some localized weakness with history reported right eyelid droop and some increased weakness of right upper extremity-over past couple fo weeks.  He has right-sided weakness at baseline related to prior brainstem stroke. Wife has noticed that he had decreased grip with things like spoons recently. Also question of some right facial droop. He is eating well with no dysphagia. Patient also complained of some very transient binocular diplopia.  No headaches.  Past history significant for pituitary adenoma with last MRI scan to monitor over one year ago. History of brain stem stroke in 1994.  Patient has chronic venous stasis edema. Stopped furosemide couple weeks ago wondering if this was making him more fatigued. Is using support hose. Elevating feet 3 frequently. Other medications reviewed. Recent electrolytes unremarkable. Has not had thyroid functions or other labs recently (other than BMP).  Allergies: 1)  ! Penicillin V Potassium (Penicillin V Potassium)  Past History:  Past Surgical History: Last updated: 01/15/2009 Trac 02/1993, removed 04/1993 Stomach peg 02/1993, removed 5-6 years later 2007 pituitary tumor  Family History: Last updated: 10/16/2008 Family History of Alcoholism/Addiction Family History of Arthritis Family History of Colon CA 1st degree relative <60  brother Family History High cholesterol Family History Hypertension Family History of Stroke F 1st degree relative <60 Throat Cancer father  Social History: Last updated: 10/16/2008 Retired, disabled 1994 Quadreplegic Married Alcohol use-yes, rarely  Past Medical History: Colonic polyps, hx  of Hypertension Cerebrovascular accident, hx of brainstem stroke 1994 Hx pituitary adenoma Urine Incontinence Venous Stasis Edema PMH-FH-SH reviewed for relevance  Review of Systems       The patient complains of peripheral edema and muscle weakness.  The patient denies anorexia, fever, weight loss, weight gain, vision loss, decreased hearing, chest pain, syncope, prolonged cough, headaches, hemoptysis, abdominal pain, melena, hematochezia, severe indigestion/heartburn, and depression.    Physical Exam  General:  pt sitting in wheelchair alert and cooperative. Head:  Normocephalic and atraumatic without obvious abnormalities. No apparent alopecia or balding. Eyes:  pupils equal, pupils round, and pupils reactive to light.   Ears:  External ear exam shows no significant lesions or deformities.  Otoscopic examination reveals clear canals, tympanic membranes are intact bilaterally without bulging, retraction, inflammation or discharge. Hearing is grossly normal bilaterally. Mouth:  Oral mucosa and oropharynx without lesions or exudates.  Teeth in good repair. Neck:  No deformities, masses, or tenderness noted. Lungs:  Normal respiratory effort, chest expands symmetrically. Lungs are clear to auscultation, no crackles or wheezes. Heart:  normal rate and regular rhythm.   Extremities:  bilateral support hose. He has 1+ to 2+ pitting edema. Neurologic:  patient has right eyelid droop. Also question of some mild right facial weakness. Other cranial nerves are intact with the exception of possibly diminished gag reflex. He has bilateral upper extremity weakness with 4+ over 5+ strength. Cervical Nodes:  No lymphadenopathy noted Psych:  normally interactive, good eye contact, not anxious appearing, and not depressed appearing.     Impression & Recommendations:  Problem # 1:  PITUITARY NEOPLASM, HX OF (ICD-V10.88) repeat MRI esp in light of recent new neuro findings. Orders: Radiology Referral  (Radiology)  Problem # 2:  FACIAL WEAKNESS (ICD-781.94) Assessment: New  New finding of oculomotor involvement right eye and possibly some right facial weakness as well. Needs MRI to further assess  Orders: Radiology Referral (Radiology)  Problem # 3:  WEAKNESS (ICD-780.79) Assessment: New labs as below. Orders: TLB-BMP (Basic Metabolic Panel-BMET) (80048-METABOL) TLB-CBC Platelet - w/Differential (85025-CBCD) TLB-Hepatic/Liver Function Pnl (80076-HEPATIC) TLB-TSH (Thyroid Stimulating Hormone) (84443-TSH) TLB-Sedimentation Rate (ESR) (85652-ESR) TLB-B12, Serum-Total ONLY (16109-U04) Venipuncture (54098) Specimen Handling (11914)  Complete Medication List: 1)  Lexapro 10 Mg Tabs (Escitalopram oxalate) .... Once daily 2)  Nitrofurantoin Macrocrystal 100 Mg Caps (Nitrofurantoin macrocrystal) .... Once daily 3)  Oxybutynin Chloride 5 Mg Xr24h-tab (Oxybutynin chloride) .... One tab two times a day 4)  Dyflex-g 200-200 Mg Tabs (Dyphylline-guaifenesin) 5)  Trazodone Hcl 100 Mg Tabs (Trazodone hcl) .... Once daily 6)  Lisinopril-hydrochlorothiazide 10-12.5 Mg Tabs (Lisinopril-hydrochlorothiazide) .... Once daily 7)  Adprin B 325 Mg Tabs (Aspirin buf(cacarb-mgcarb-mgo)) .... Qd 8)  Centrum Silver Ultra Mens Tabs (Multiple vitamins-minerals) .... Once daily 9)  Furosemide 40 Mg Tabs (Furosemide) .... One to two tabs by mouth as needed 10)  Triamcinolone Acetonide 0.1 % Crea (Triamcinolone acetonide) .... Apply to affected area two times a day as needed 11)  Plavix 75 Mg Tabs (Clopidogrel bisulfate) .... One by mouth once daily 12)  Potassium Chloride Crys Cr 20 Meq Cr-tabs (Potassium chloride crys cr) .... One by mouth once daily  Patient Instructions: 1)  We will call you regarding MRI scan. 2)  Follow up promptly if any difficulty swallowing. 3)  Eat slowly.  Appended Document: Orders Update    Clinical Lists Changes  Orders: Added new Service order of UA Dipstick w/o Micro  (automated)  (81003) - Signed Added new Test order of T-Culture, Urine ([id number]) - Signed Observations: Added new observation of COMMENTS: Wynona Canes, CMA  April 04, 2010 10:38 AM  (04/04/2010 10:36) Added new observation of PH URINE: 7.0  (04/04/2010 10:36) Added new observation of SPEC GR URIN: 1.010  (04/04/2010 10:36) Added new observation of APPEARANCE U: Clear  (04/04/2010 10:36) Added new observation of UA COLOR: yellow  (04/04/2010 10:36) Added new observation of WBC DIPSTK U: trace  (04/04/2010 10:36) Added new observation of NITRITE URN: negative  (04/04/2010 10:36) Added new observation of UROBILINOGEN: 0.2  (04/04/2010 10:36) Added new observation of PROTEIN, URN: negative  (04/04/2010 10:36) Added new observation of BLOOD UR DIP: negative  (04/04/2010 10:36) Added new observation of KETONES URN: negative  (04/04/2010 10:36) Added new observation of BILIRUBIN UR: negative  (04/04/2010 10:36) Added new observation of GLUCOSE, URN: negative  (04/04/2010 10:36)      Laboratory Results   Urine Tests  Date/Time Recieved: Wynona Canes, CMA  April 04, 2010 April 04, 2010 10:38 AM   Date/Time Reported: April 04, 2010 10:38 AM   Routine Urinalysis   Color: yellow Appearance: Clear Glucose: negative   (Normal Range: Negative) Bilirubin: negative   (Normal Range: Negative) Ketone: negative   (Normal Range: Negative) Spec. Gravity: 1.010   (Normal Range: 1.003-1.035) Blood: negative   (Normal Range: Negative) pH: 7.0   (Normal Range: 5.0-8.0) Protein: negative   (Normal Range: Negative) Urobilinogen: 0.2   (Normal Range: 0-1) Nitrite: negative   (Normal Range: Negative) Leukocyte Esterace: trace   (Normal Range: Negative)    Comments: Wynona Canes, CMA  April 04, 2010 10:38 AM

## 2010-09-12 NOTE — Assessment & Plan Note (Signed)
Summary: testosterone//alp  Nurse Visit   Allergies: 1)  ! Penicillin V Potassium (Penicillin V Potassium)  Medication Administration  Injection # 1:    Medication: Testosterone Cypionat 200mg  ing    Diagnosis: HYPOGONADISM (ICD-257.2)    Route: IM    Site: RUOQ gluteus    Exp Date: 12/10/2011    Lot #: [id number]    Mfr: Gaylyn Rong    Patient tolerated injection without complications    Given by: Sid Falcon LPN (August 23, 2010 12:28 PM)

## 2010-09-12 NOTE — Assessment & Plan Note (Signed)
Summary: testosterone//alp  Nurse Visit   Allergies: 1)  ! Penicillin V Potassium (Penicillin V Potassium)  Medication Administration  Injection # 1:    Medication: Testosterone Cypionat 200mg  ing    Diagnosis: HYPOGONADISM (ICD-257.2)    Route: IM    Site: LUOQ gluteus    Lot #: [id number]    Mfr: Paddock    Patient tolerated injection without complications    Given by: Sid Falcon LPN (July 22, 2010 12:50 PM)  Orders Added: 1)  Admin of patients own med IM/SQ [id number]

## 2010-09-23 ENCOUNTER — Other Ambulatory Visit: Payer: Self-pay | Admitting: Family Medicine

## 2010-09-23 DIAGNOSIS — Z85858 Personal history of malignant neoplasm of other endocrine glands: Secondary | ICD-10-CM

## 2010-09-24 ENCOUNTER — Ambulatory Visit (INDEPENDENT_AMBULATORY_CARE_PROVIDER_SITE_OTHER): Payer: Medicare PPO | Admitting: Family Medicine

## 2010-09-24 ENCOUNTER — Encounter: Payer: Self-pay | Admitting: Family Medicine

## 2010-09-24 VITALS — BP 120/78 | Temp 97.4°F | Wt 210.0 lb

## 2010-09-24 DIAGNOSIS — E291 Testicular hypofunction: Secondary | ICD-10-CM

## 2010-09-24 MED ORDER — TESTOSTERONE CYPIONATE 200 MG/ML IM SOLN
200.0000 mg | Freq: Once | INTRAMUSCULAR | Status: AC
  Administered 2010-09-24: 200 mg via INTRAMUSCULAR

## 2010-09-24 NOTE — Progress Notes (Signed)
  Subjective:    Patient ID: Austin [name], male    DOB: 01/28/1956, 55 y.o.   MRN: [id number]  HPI  Patient seen for followup hypo-testosteronism. He has history of pituitary neoplasm.   No evidence for pan hypopituitarism. Testosterone level 51 at [facility] several months ago. Initiated testosterone 200 mg IM monthly. He has noticed some improvement in overall well-being and energy and stamina. Does notice some drop off in symptoms after about 2-3 weeks. No followup yet level yet. Denies recent chest pains. PSA has been normal.   Review of Systems  patient denies any chest pains, fever, chills, chronic cough , or any dyspnea    Objective:   Physical Exam  patient is alert cooperative and in no distress.  Oropharynx is clear Neck supple to mass Chest clear to auscultation Heart regular rhythm and rate Extremities trace edema bilaterally       Assessment & Plan:   hypogonadism. Recheck a testosterone level today.

## 2010-09-25 ENCOUNTER — Telehealth: Payer: Self-pay | Admitting: *Deleted

## 2010-09-25 DIAGNOSIS — E291 Testicular hypofunction: Secondary | ICD-10-CM

## 2010-09-25 MED ORDER — TESTOSTERONE 20.25 MG/ACT (1.62%) TD GEL: 2.0000 | Freq: Every day | TRANSDERMAL | Status: DC

## 2010-09-25 NOTE — Telephone Encounter (Signed)
Austin [name] calling back requesting more information for topical testosterone be provided so they can check with their insurance.  May send to their pharmacy and pharmacist can help with insurance benefit questions.  She is currently scheduling an injection in 2 weeks

## 2010-09-25 NOTE — Telephone Encounter (Signed)
Androgel would be recommended topical 4 sprays per pump daily

## 2010-09-30 ENCOUNTER — Telehealth: Payer: Self-pay | Admitting: Family Medicine

## 2010-09-30 NOTE — Telephone Encounter (Signed)
Noted  

## 2010-09-30 NOTE — Telephone Encounter (Signed)
Pt is no longer interested in testosterone pump spray(androgel) due to cost. He will stick with testosterone inj for now.

## 2010-10-03 ENCOUNTER — Other Ambulatory Visit: Payer: Self-pay | Admitting: Family Medicine

## 2010-10-08 ENCOUNTER — Ambulatory Visit: Payer: Medicare PPO | Admitting: Family Medicine

## 2010-10-08 DIAGNOSIS — E291 Testicular hypofunction: Secondary | ICD-10-CM

## 2010-10-08 MED ORDER — TESTOSTERONE CYPIONATE 200 MG/ML IM SOLN
200.0000 mg | INTRAMUSCULAR | Status: DC
  Administered 2010-10-08: 200 mg via INTRAMUSCULAR

## 2010-10-10 ENCOUNTER — Other Ambulatory Visit: Payer: Self-pay | Admitting: Family Medicine

## 2010-10-11 NOTE — Telephone Encounter (Signed)
Please advise 

## 2010-10-11 NOTE — Telephone Encounter (Signed)
Ok to refill for 6 months 

## 2010-10-22 ENCOUNTER — Ambulatory Visit: Payer: Medicare PPO | Admitting: Family Medicine

## 2010-10-22 DIAGNOSIS — E291 Testicular hypofunction: Secondary | ICD-10-CM

## 2010-10-22 MED ORDER — TESTOSTERONE CYPIONATE 200 MG/ML IM SOLN
200.0000 mg | INTRAMUSCULAR | Status: DC
  Administered 2010-10-22: 200 mg via INTRAMUSCULAR

## 2010-11-05 ENCOUNTER — Ambulatory Visit: Payer: Medicare PPO | Admitting: Family Medicine

## 2010-11-05 DIAGNOSIS — E291 Testicular hypofunction: Secondary | ICD-10-CM

## 2010-11-05 MED ORDER — TESTOSTERONE CYPIONATE 200 MG/ML IM SOLN
200.0000 mg | Freq: Once | INTRAMUSCULAR | Status: AC
  Administered 2010-11-05: 200 mg via INTRAMUSCULAR

## 2010-11-06 ENCOUNTER — Other Ambulatory Visit: Payer: Self-pay | Admitting: Family Medicine

## 2010-11-26 ENCOUNTER — Ambulatory Visit (INDEPENDENT_AMBULATORY_CARE_PROVIDER_SITE_OTHER): Payer: Medicare PPO | Admitting: Family Medicine

## 2010-11-26 DIAGNOSIS — E291 Testicular hypofunction: Secondary | ICD-10-CM

## 2010-11-26 MED ORDER — TESTOSTERONE CYPIONATE 200 MG/ML IM SOLN
200.0000 mg | INTRAMUSCULAR | Status: DC
  Administered 2010-11-26: 200 mg via INTRAMUSCULAR

## 2010-12-05 ENCOUNTER — Other Ambulatory Visit: Payer: Self-pay | Admitting: Internal Medicine

## 2010-12-10 ENCOUNTER — Ambulatory Visit: Payer: Medicare PPO | Admitting: Family Medicine

## 2010-12-10 DIAGNOSIS — E291 Testicular hypofunction: Secondary | ICD-10-CM

## 2010-12-10 MED ORDER — TESTOSTERONE CYPIONATE 200 MG/ML IM SOLN
200.0000 mg | INTRAMUSCULAR | Status: DC
  Administered 2010-12-10: 200 mg via INTRAMUSCULAR

## 2010-12-24 ENCOUNTER — Ambulatory Visit: Payer: Medicare PPO | Admitting: Family Medicine

## 2010-12-24 DIAGNOSIS — E291 Testicular hypofunction: Secondary | ICD-10-CM

## 2010-12-24 MED ORDER — TESTOSTERONE CYPIONATE 200 MG/ML IM SOLN: 200.0000 mg | INTRAMUSCULAR | Status: DC

## 2010-12-24 MED ORDER — TESTOSTERONE CYPIONATE 200 MG/ML IM SOLN
200.0000 mg | INTRAMUSCULAR | Status: DC
  Administered 2010-12-24: 200 mg via INTRAMUSCULAR

## 2011-01-02 ENCOUNTER — Encounter: Payer: Self-pay | Admitting: Family Medicine

## 2011-01-02 ENCOUNTER — Ambulatory Visit (INDEPENDENT_AMBULATORY_CARE_PROVIDER_SITE_OTHER): Payer: Medicare PPO | Admitting: Family Medicine

## 2011-01-02 VITALS — BP 140/70 | Temp 98.0°F

## 2011-01-02 DIAGNOSIS — R300 Dysuria: Secondary | ICD-10-CM

## 2011-01-02 LAB — POCT URINALYSIS DIPSTICK
Bilirubin, UA: NEGATIVE
Glucose, UA: NEGATIVE
Ketones, UA: NEGATIVE
Spec Grav, UA: 1.005

## 2011-01-02 MED ORDER — CIPROFLOXACIN HCL 500 MG PO TABS: 500.0000 mg | ORAL_TABLET | Freq: Two times a day (BID) | ORAL | Status: AC

## 2011-01-02 NOTE — Patient Instructions (Signed)
Follow up promptly for any fever or other problems. 

## 2011-01-02 NOTE — Progress Notes (Signed)
  Subjective:    Patient ID: Austin [name], male    DOB: [birth year], 55 y.o.   MRN: [id number]  HPI Patient seen with some dysuria. History of neurogenic bladder. In out catheter twice daily. No fever or chills. Generalized malaise and body aches. Infrequent UTIs in the past.  Wife noted some cloudy urine 2 days ago with some increased odor.  He has history of hypogonadism, hypertension, neurogenic bladder, history of pituitary tumor, and history of CVA. He has followup in June and we plan to get thyroid functions, testosterone, and  cortisol level then. Has not seen much improvement in malaise since starting testosterone replacement yet.   Review of Systems  Constitutional: Positive for fatigue. Negative for fever and chills.  Respiratory: Negative for shortness of breath.   Gastrointestinal: Negative for abdominal pain.  Genitourinary: Positive for dysuria. Negative for urgency, hematuria, flank pain and decreased urine volume.       Objective:   Physical Exam  Constitutional: He is oriented to person, place, and time. He appears well-developed and well-nourished.  Cardiovascular: Normal rate, regular rhythm and normal heart sounds.   Pulmonary/Chest: Effort normal and breath sounds normal. No respiratory distress. He has no wheezes. He has no rales.  Musculoskeletal: He exhibits edema.       Patient has trace to 1+ edema lower legs and ankles bilaterally. This is chronic  Neurological: He is alert and oriented to person, place, and time.  Psychiatric: He has a normal mood and affect.          Assessment & Plan:   #1 dysuria. Increased risks for a UTI with in and out catheter. Send urine culture. Cipro 500 mg twice daily pending culture results and followup promptly for fever or worsening symptoms #2 history of pituitary neoplasm. Hypogonadism. Check TSH, T4, cortisol and testosterone level in June

## 2011-01-04 LAB — URINE CULTURE: Colony Count: >=100000

## 2011-01-07 ENCOUNTER — Ambulatory Visit: Payer: Medicare PPO | Admitting: Family Medicine

## 2011-01-07 DIAGNOSIS — E291 Testicular hypofunction: Secondary | ICD-10-CM

## 2011-01-07 MED ORDER — TESTOSTERONE CYPIONATE 200 MG/ML IM SOLN
200.0000 mg | INTRAMUSCULAR | Status: DC
  Administered 2011-01-07: 200 mg via INTRAMUSCULAR

## 2011-01-07 NOTE — Progress Notes (Signed)
Quick Note:  Pt wife informed ______ 

## 2011-01-08 ENCOUNTER — Other Ambulatory Visit: Payer: Self-pay | Admitting: Family Medicine

## 2011-01-09 ENCOUNTER — Other Ambulatory Visit: Payer: Self-pay | Admitting: *Deleted

## 2011-01-09 NOTE — Telephone Encounter (Signed)
Rx filled electronically  °

## 2011-01-09 NOTE — Telephone Encounter (Signed)
Refill for one year 

## 2011-01-09 NOTE — Telephone Encounter (Signed)
Fax request to refill Oxybutynin 5 mg, one tab bid.  #60 with 0 refills on 12/05/10 [facility]

## 2011-01-23 ENCOUNTER — Ambulatory Visit (INDEPENDENT_AMBULATORY_CARE_PROVIDER_SITE_OTHER): Payer: Medicare PPO | Admitting: Family Medicine

## 2011-01-23 ENCOUNTER — Encounter: Payer: Self-pay | Admitting: Family Medicine

## 2011-01-23 DIAGNOSIS — Z8679 Personal history of other diseases of the circulatory system: Secondary | ICD-10-CM

## 2011-01-23 DIAGNOSIS — E291 Testicular hypofunction: Secondary | ICD-10-CM

## 2011-01-23 DIAGNOSIS — Z85858 Personal history of malignant neoplasm of other endocrine glands: Secondary | ICD-10-CM

## 2011-01-23 DIAGNOSIS — R5381 Other malaise: Secondary | ICD-10-CM

## 2011-01-23 DIAGNOSIS — I10 Essential (primary) hypertension: Secondary | ICD-10-CM

## 2011-01-23 DIAGNOSIS — R5383 Other fatigue: Secondary | ICD-10-CM

## 2011-01-23 DIAGNOSIS — N319 Neuromuscular dysfunction of bladder, unspecified: Secondary | ICD-10-CM

## 2011-01-23 LAB — TSH: TSH: 1.99 u[IU]/mL (ref 0.35–5.50)

## 2011-01-23 LAB — TESTOSTERONE: Testosterone: 394.52 ng/dL (ref 350.00–890.00)

## 2011-01-23 MED ORDER — TESTOSTERONE CYPIONATE 200 MG/ML IM SOLN
200.0000 mg | INTRAMUSCULAR | Status: DC
  Administered 2011-01-23: 200 mg via INTRAMUSCULAR

## 2011-01-23 NOTE — Progress Notes (Signed)
  Subjective:    Patient ID: Austin [name], male    DOB: January 16, 1956, 55 y.o.   MRN: [id number]  HPI Patient has history of brain stem stroke, chronic lower extremity edema, hypertension, hypogonadism, neurogenic bladder, and history of pituitary neoplasm. Recently treated with gamma knife Surgery for his pituitary neoplasm. Testosterone replacement. Still has significant fatigue. Needs further pituitary assessment today including cortisol, thyroid function, and testosterone level.  Medications reviewed. Compliant with all. Recent UTI with no cloudy urine at this point. No fever or chills. In and out catheterizations.  Has some occasional dizziness which seems to be postural. Blood pressures have been stable when checked by wife.  Also has some diffuse arthralgias which they think are related to sedentary life. Unable to ambulate. No inflammatory changes. Question of rheumatoid arthritis in mother. Does get relief with Advil. Mostly involvement of shoulders and knees  Past Medical History  Diagnosis Date  . Hx of colonic polyps   . Hypertension   . Cerebrovascular accident 1994    hx of brainstem stroke  . Pituitary adenoma   . Urinary incontinence   . Venous stasis     edema  . 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 Hypogonadism    Past Surgical History  Procedure Date  . Tracheostomy tube placement 02/1993    removed 04/1993  . Stomach peg 02/1993    removed 5-6 years later    reports that he quit smoking about 18 years ago. His smoking use included Cigarettes. He has a 11 pack-year smoking history. He does not have any smokeless tobacco history on file. He reports that he drinks alcohol. His drug history not on file. family history includes Alcohol abuse in an unspecified family member; Arthritis in an unspecified family member; Colon cancer in his brother; Hyperlipidemia in an unspecified family member; Hypertension in an unspecified family member;  Stroke in an unspecified family member; and Throat cancer in his father. Allergies  Allergen Reactions  . Penicillins     REACTION: rash     Review of Systems  Constitutional: Positive for chills and fatigue. Negative for fever, activity change, appetite change and unexpected weight change.  Respiratory: Negative for shortness of breath.   Cardiovascular: Positive for leg swelling. Negative for chest pain and palpitations.  Gastrointestinal: Negative for abdominal pain and blood in stool.  Neurological: Positive for dizziness.       Objective:   Physical Exam  Constitutional: He is oriented to person, place, and time. He appears well-developed and well-nourished.  HENT:  Right Ear: External ear normal.  Left Ear: External ear normal.  Mouth/Throat: Oropharynx is clear and moist.  Neck: Neck supple.  Cardiovascular: Normal rate and regular rhythm.  Exam reveals no gallop.   Pulmonary/Chest: Effort normal and breath sounds normal. No respiratory distress. He has no wheezes. He has no rales.  Musculoskeletal: He exhibits edema.  Lymphadenopathy:    He has no cervical adenopathy.  Neurological: He is alert and oriented to person, place, and time.  Skin: No rash noted.  Psychiatric: He has a normal mood and affect.          Assessment & Plan:  #1 history of pituitary neoplasm. Needs reassessment of lab work above #2 low testosterone. Replaced today with injection and recheck levels #3 hypertension stable #4 recent UTI improved #5 profound fatigue-assess pituitary fxn as above

## 2011-01-24 NOTE — Progress Notes (Signed)
Quick Note:  Pt wife informed, per Dr Caryl Never, cont on inj every 2 weeks ______

## 2011-02-01 ENCOUNTER — Other Ambulatory Visit: Payer: Self-pay | Admitting: Family Medicine

## 2011-02-06 ENCOUNTER — Ambulatory Visit: Payer: Medicare PPO | Admitting: Family Medicine

## 2011-02-06 DIAGNOSIS — E349 Endocrine disorder, unspecified: Secondary | ICD-10-CM

## 2011-02-06 MED ORDER — TESTOSTERONE CYPIONATE 200 MG/ML IM SOLN
200.0000 mg | Freq: Once | INTRAMUSCULAR | Status: AC
  Administered 2011-02-06: 200 mg via INTRAMUSCULAR

## 2011-02-19 ENCOUNTER — Ambulatory Visit: Payer: Medicare PPO | Admitting: Family Medicine

## 2011-02-19 DIAGNOSIS — E291 Testicular hypofunction: Secondary | ICD-10-CM

## 2011-02-19 DIAGNOSIS — Z8639 Personal history of other endocrine, nutritional and metabolic disease: Secondary | ICD-10-CM

## 2011-02-19 MED ORDER — TESTOSTERONE CYPIONATE 200 MG/ML IM SOLN
200.0000 mg | INTRAMUSCULAR | Status: DC
  Administered 2011-02-19: 200 mg via INTRAMUSCULAR

## 2011-03-04 ENCOUNTER — Ambulatory Visit: Payer: Medicare PPO | Admitting: Family Medicine

## 2011-03-04 DIAGNOSIS — E291 Testicular hypofunction: Secondary | ICD-10-CM

## 2011-03-04 DIAGNOSIS — Z8639 Personal history of other endocrine, nutritional and metabolic disease: Secondary | ICD-10-CM

## 2011-03-04 MED ORDER — TESTOSTERONE CYPIONATE 200 MG/ML IM SOLN
200.0000 mg | INTRAMUSCULAR | Status: DC
  Administered 2011-03-04: 200 mg via INTRAMUSCULAR

## 2011-03-19 ENCOUNTER — Other Ambulatory Visit: Payer: Self-pay | Admitting: *Deleted

## 2011-03-19 MED ORDER — ESCITALOPRAM OXALATE 10 MG PO TABS: 10.0000 mg | ORAL_TABLET | Freq: Every day | ORAL | Status: DC

## 2011-03-20 ENCOUNTER — Ambulatory Visit: Payer: Medicare PPO | Admitting: Family Medicine

## 2011-03-20 DIAGNOSIS — Z8639 Personal history of other endocrine, nutritional and metabolic disease: Secondary | ICD-10-CM

## 2011-03-20 DIAGNOSIS — E291 Testicular hypofunction: Secondary | ICD-10-CM

## 2011-03-20 MED ORDER — TESTOSTERONE CYPIONATE 200 MG/ML IM SOLN
200.0000 mg | INTRAMUSCULAR | Status: DC
  Administered 2011-03-20: 200 mg via INTRAMUSCULAR

## 2011-04-03 ENCOUNTER — Ambulatory Visit (INDEPENDENT_AMBULATORY_CARE_PROVIDER_SITE_OTHER): Payer: Medicare PPO | Admitting: Family Medicine

## 2011-04-03 DIAGNOSIS — E291 Testicular hypofunction: Secondary | ICD-10-CM

## 2011-04-03 MED ORDER — TESTOSTERONE CYPIONATE 200 MG/ML IM SOLN
200.0000 mg | INTRAMUSCULAR | Status: DC
  Administered 2011-04-03: 200 mg via INTRAMUSCULAR

## 2011-04-10 ENCOUNTER — Other Ambulatory Visit: Payer: Self-pay | Admitting: Family Medicine

## 2011-04-18 ENCOUNTER — Ambulatory Visit (INDEPENDENT_AMBULATORY_CARE_PROVIDER_SITE_OTHER): Payer: Medicare PPO | Admitting: Family Medicine

## 2011-04-18 DIAGNOSIS — E291 Testicular hypofunction: Secondary | ICD-10-CM

## 2011-04-18 MED ORDER — TESTOSTERONE CYPIONATE 200 MG/ML IM SOLN
100.0000 mg | Freq: Once | INTRAMUSCULAR | Status: AC
  Administered 2011-04-18: 100 mg via INTRAMUSCULAR

## 2011-05-02 ENCOUNTER — Ambulatory Visit: Payer: Medicare PPO | Admitting: Family Medicine

## 2011-05-02 DIAGNOSIS — E291 Testicular hypofunction: Secondary | ICD-10-CM

## 2011-05-02 DIAGNOSIS — Z8639 Personal history of other endocrine, nutritional and metabolic disease: Secondary | ICD-10-CM

## 2011-05-02 MED ORDER — TESTOSTERONE CYPIONATE 200 MG/ML IM SOLN
200.0000 mg | INTRAMUSCULAR | Status: DC
  Administered 2011-05-02: 200 mg via INTRAMUSCULAR

## 2011-05-16 ENCOUNTER — Ambulatory Visit (INDEPENDENT_AMBULATORY_CARE_PROVIDER_SITE_OTHER): Payer: Medicare PPO | Admitting: Family Medicine

## 2011-05-16 DIAGNOSIS — E291 Testicular hypofunction: Secondary | ICD-10-CM

## 2011-05-16 DIAGNOSIS — E349 Endocrine disorder, unspecified: Secondary | ICD-10-CM

## 2011-05-16 MED ORDER — TESTOSTERONE CYPIONATE 200 MG/ML IM SOLN: 200.0000 mg | INTRAMUSCULAR | Status: DC

## 2011-05-16 MED ORDER — TESTOSTERONE CYPIONATE 200 MG/ML IM SOLN
200.0000 mg | INTRAMUSCULAR | Status: DC
  Administered 2011-05-16: 200 mg via INTRAMUSCULAR

## 2011-05-26 ENCOUNTER — Ambulatory Visit: Payer: Medicare PPO | Admitting: Family Medicine

## 2011-05-27 ENCOUNTER — Encounter: Payer: Self-pay | Admitting: Family Medicine

## 2011-05-27 ENCOUNTER — Ambulatory Visit (INDEPENDENT_AMBULATORY_CARE_PROVIDER_SITE_OTHER): Payer: Medicare PPO | Admitting: Family Medicine

## 2011-05-27 VITALS — BP 130/80 | Temp 97.8°F | Wt 180.0 lb

## 2011-05-27 DIAGNOSIS — I10 Essential (primary) hypertension: Secondary | ICD-10-CM

## 2011-05-27 DIAGNOSIS — R609 Edema, unspecified: Secondary | ICD-10-CM

## 2011-05-27 DIAGNOSIS — N319 Neuromuscular dysfunction of bladder, unspecified: Secondary | ICD-10-CM

## 2011-05-27 DIAGNOSIS — Z85858 Personal history of malignant neoplasm of other endocrine glands: Secondary | ICD-10-CM

## 2011-05-27 DIAGNOSIS — E291 Testicular hypofunction: Secondary | ICD-10-CM

## 2011-05-27 DIAGNOSIS — Z23 Encounter for immunization: Secondary | ICD-10-CM

## 2011-05-27 MED ORDER — TESTOSTERONE CYPIONATE 200 MG/ML IM SOLN
200.0000 mg | INTRAMUSCULAR | Status: DC
  Administered 2011-05-27: 200 mg via INTRAMUSCULAR

## 2011-05-27 NOTE — Progress Notes (Signed)
Subjective:    Patient ID: Austin [name], male    DOB: [birth year], 55 y.o.   MRN: [id number]  HPI Medical followup. History of pituitary tumor and prior history of brainstem stroke. Wheelchair bound. Recent gamma knife surgery for recurrent pituitary tumor. Doing well with no headaches. Recent TSH and cortisol normal. Low testosterone on replacement. Has not seen any improvement in fatigue since starting testosterone. Complains of diffuse muscle stiffness. Very inactive. More muscle aches than arthralgias.  Lower extremity edema has improved since starting testosterone replacement and they have noted reduction in central body fat. Takes Ditropan with history of neurogenic bladder. In and out bladder catheter twice daily. Requesting reduction in Ditropan.   occasional dry mouth. No recent infectious symptoms such as fever or chills. Takes Macrobid once daily for UTI prophylaxis and this seems to be working well.  Past Medical History  Diagnosis Date  . Hx of colonic polyps   . Hypertension   . Cerebrovascular accident 1994    hx of brainstem stroke  . Pituitary adenoma   . Urinary incontinence   . Venous stasis     edema  . 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 Hypogonadism    Past Surgical History  Procedure Date  . Tracheostomy tube placement 02/1993    removed 04/1993  . Stomach peg 02/1993    removed 5-6 years later    reports that he quit smoking about 18 years ago. His smoking use included Cigarettes. He has a 11 pack-year smoking history. He does not have any smokeless tobacco history on file. He reports that he drinks alcohol. His drug history not on file. family history includes Alcohol abuse in an unspecified family member; Arthritis in an unspecified family member; Colon cancer in his brother; Hyperlipidemia in an unspecified family member; Hypertension in an unspecified family member; Stroke in an unspecified family member; and Throat cancer  in his father. Allergies  Allergen Reactions  . Penicillins     REACTION: rash      Review of Systems  Constitutional: Positive for fatigue. Negative for fever, chills and unexpected weight change.  Respiratory: Negative for cough, shortness of breath and wheezing.   Cardiovascular: Negative for chest pain, palpitations and leg swelling.  Gastrointestinal: Negative for abdominal pain.  Neurological: Negative for dizziness, syncope and headaches.  Hematological: Negative for adenopathy.  Psychiatric/Behavioral: Negative for dysphoric mood.       Objective:   Physical Exam  Constitutional: He is oriented to person, place, and time. He appears well-developed and well-nourished.  HENT:  Mouth/Throat: Oropharynx is clear and moist.  Neck: Neck supple.  Cardiovascular: Normal rate and regular rhythm.   No murmur heard. Pulmonary/Chest: Effort normal and breath sounds normal. No respiratory distress. He has no wheezes. He has no rales.  Musculoskeletal: He exhibits no edema.  Lymphadenopathy:    He has no cervical adenopathy.  Neurological: He is alert and oriented to person, place, and time.          Assessment & Plan:  #1 hypogonadism. Testosterone injection today. Recent levels have improved with replacement  #2 muscle stiffness. Likely secondary to inactivity. Omega 3 supplement 2-3 g daily. Consider physical therapy and they would like to wait at this time #3 hypertension adequately controlled continue current medicatio  #4 peripheral edema improved after testosterone replacement #5 neurogenic bladder with history of recurrent UTI.  will try discontinuing daytime dose of Ditropan and continue night time .  No recent  UTI. #6 health maintenance. Flu vaccine given.

## 2011-05-27 NOTE — Patient Instructions (Signed)
Consider omega 3, 2-3 gram per day Consider physical therapy if your stiffness persists/worsens.

## 2011-06-10 ENCOUNTER — Ambulatory Visit (INDEPENDENT_AMBULATORY_CARE_PROVIDER_SITE_OTHER): Payer: Medicare PPO | Admitting: Family Medicine

## 2011-06-10 DIAGNOSIS — E291 Testicular hypofunction: Secondary | ICD-10-CM

## 2011-06-10 DIAGNOSIS — E349 Endocrine disorder, unspecified: Secondary | ICD-10-CM

## 2011-06-10 MED ORDER — TESTOSTERONE CYPIONATE 200 MG/ML IM SOLN
200.0000 mg | INTRAMUSCULAR | Status: DC
  Administered 2011-06-10: 200 mg via INTRAMUSCULAR

## 2011-06-24 ENCOUNTER — Ambulatory Visit: Payer: Medicare PPO | Admitting: *Deleted

## 2011-07-09 ENCOUNTER — Ambulatory Visit (INDEPENDENT_AMBULATORY_CARE_PROVIDER_SITE_OTHER): Payer: Medicare PPO | Admitting: Family Medicine

## 2011-07-09 DIAGNOSIS — E291 Testicular hypofunction: Secondary | ICD-10-CM

## 2011-07-09 DIAGNOSIS — E349 Endocrine disorder, unspecified: Secondary | ICD-10-CM

## 2011-07-09 MED ORDER — TESTOSTERONE CYPIONATE 200 MG/ML IM SOLN
200.0000 mg | INTRAMUSCULAR | Status: DC
  Administered 2011-07-09: 200 mg via INTRAMUSCULAR

## 2011-07-23 ENCOUNTER — Other Ambulatory Visit: Payer: Self-pay | Admitting: Family Medicine

## 2011-07-23 ENCOUNTER — Ambulatory Visit (INDEPENDENT_AMBULATORY_CARE_PROVIDER_SITE_OTHER): Payer: Medicare PPO | Admitting: Family Medicine

## 2011-07-23 DIAGNOSIS — Z8639 Personal history of other endocrine, nutritional and metabolic disease: Secondary | ICD-10-CM

## 2011-07-23 DIAGNOSIS — Z862 Personal history of diseases of the blood and blood-forming organs and certain disorders involving the immune mechanism: Secondary | ICD-10-CM

## 2011-07-23 MED ORDER — TESTOSTERONE CYPIONATE 200 MG/ML IM SOLN
200.0000 mg | INTRAMUSCULAR | Status: DC
  Administered 2011-07-23: 200 mg via INTRAMUSCULAR

## 2011-07-31 ENCOUNTER — Telehealth (INDEPENDENT_AMBULATORY_CARE_PROVIDER_SITE_OTHER): Payer: Self-pay | Admitting: Family Medicine

## 2011-07-31 DIAGNOSIS — R300 Dysuria: Secondary | ICD-10-CM

## 2011-07-31 NOTE — Telephone Encounter (Signed)
We really need to get a culture as he has increased risk of resistant infection with his of in and out caths.

## 2011-07-31 NOTE — Telephone Encounter (Signed)
Has a bladder infection coming on and would like to start treatment. Has lower back pain, cloudy foul smelling urine. 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 He is requesting something to be called to [facility]. Thanks.

## 2011-08-01 ENCOUNTER — Other Ambulatory Visit (INDEPENDENT_AMBULATORY_CARE_PROVIDER_SITE_OTHER): Payer: Medicare PPO

## 2011-08-01 DIAGNOSIS — R300 Dysuria: Secondary | ICD-10-CM

## 2011-08-01 LAB — POCT URINALYSIS DIPSTICK
Blood, UA: NEGATIVE
Ketones, UA: NEGATIVE
Protein, UA: NEGATIVE
Spec Grav, UA: 1.01
Urobilinogen, UA: 0.2
pH, UA: 6.5

## 2011-08-01 NOTE — Telephone Encounter (Signed)
I spoke with pt wife, she was asked to bring a urine sample for urinalysis and culture Please put pt on lab schedule around 2pm. Thanks

## 2011-08-04 ENCOUNTER — Other Ambulatory Visit: Payer: Self-pay | Admitting: Family Medicine

## 2011-08-04 LAB — URINE CULTURE

## 2011-08-04 MED ORDER — CIPROFLOXACIN HCL 500 MG PO TABS: 500.0000 mg | ORAL_TABLET | Freq: Two times a day (BID) | ORAL | Status: AC

## 2011-08-19 ENCOUNTER — Other Ambulatory Visit (INDEPENDENT_AMBULATORY_CARE_PROVIDER_SITE_OTHER): Payer: Medicare Other

## 2011-08-19 DIAGNOSIS — Z125 Encounter for screening for malignant neoplasm of prostate: Secondary | ICD-10-CM

## 2011-08-19 DIAGNOSIS — R6882 Decreased libido: Secondary | ICD-10-CM

## 2011-08-19 DIAGNOSIS — E785 Hyperlipidemia, unspecified: Secondary | ICD-10-CM

## 2011-08-19 DIAGNOSIS — Z0000 Encounter for general adult medical examination without abnormal findings: Secondary | ICD-10-CM

## 2011-08-19 DIAGNOSIS — Z8639 Personal history of other endocrine, nutritional and metabolic disease: Secondary | ICD-10-CM

## 2011-08-19 LAB — TSH: TSH: 1.42 u[IU]/mL (ref 0.35–5.50)

## 2011-08-19 LAB — BASIC METABOLIC PANEL
BUN: 12 mg/dL (ref 6–23)
CO2: 27 mEq/L (ref 19–32)
Chloride: 105 mEq/L (ref 96–112)
Creatinine, Ser: 0.8 mg/dL (ref 0.4–1.5)
Potassium: 4.3 mEq/L (ref 3.5–5.1)

## 2011-08-19 LAB — LDL CHOLESTEROL, DIRECT: Direct LDL: 157.4 mg/dL

## 2011-08-19 LAB — CBC WITH DIFFERENTIAL/PLATELET
Basophils Relative: 0.7 % (ref 0.0–3.0)
Eosinophils Absolute: 0.2 10*3/uL (ref 0.0–0.7)
Eosinophils Relative: 1.3 % (ref 0.0–5.0)
HCT: 44 % (ref 39.0–52.0)
Lymphs Abs: 1.2 10*3/uL (ref 0.7–4.0)
MCHC: 33.6 g/dL (ref 30.0–36.0)
MCV: 84.8 fl (ref 78.0–100.0)
Monocytes Absolute: 0.9 10*3/uL (ref 0.1–1.0)
Platelets: 268 10*3/uL (ref 150.0–400.0)
WBC: 12.5 10*3/uL — ABNORMAL HIGH (ref 4.5–10.5)

## 2011-08-19 LAB — HEPATIC FUNCTION PANEL
ALT: 24 U/L (ref 0–53)
Total Bilirubin: 0.7 mg/dL (ref 0.3–1.2)
Total Protein: 7.5 g/dL (ref 6.0–8.3)

## 2011-08-19 LAB — LIPID PANEL
Cholesterol: 234 mg/dL — ABNORMAL HIGH (ref 0–200)
Triglycerides: 202 mg/dL — ABNORMAL HIGH (ref 0.0–149.0)

## 2011-08-19 MED ORDER — TESTOSTERONE CYPIONATE 200 MG/ML IM SOLN
200.0000 mg | INTRAMUSCULAR | Status: DC
  Administered 2011-08-19: 200 mg via INTRAMUSCULAR

## 2011-08-27 ENCOUNTER — Ambulatory Visit (INDEPENDENT_AMBULATORY_CARE_PROVIDER_SITE_OTHER): Payer: Medicare Other | Admitting: Family Medicine

## 2011-08-27 ENCOUNTER — Encounter: Payer: Self-pay | Admitting: Family Medicine

## 2011-08-27 VITALS — BP 120/80 | HR 72 | Temp 97.8°F | Resp 12 | Ht 71.0 in | Wt 190.0 lb

## 2011-08-27 DIAGNOSIS — Z0000 Encounter for general adult medical examination without abnormal findings: Secondary | ICD-10-CM

## 2011-08-27 DIAGNOSIS — E785 Hyperlipidemia, unspecified: Secondary | ICD-10-CM

## 2011-08-27 LAB — POCT URINALYSIS DIPSTICK
Blood, UA: NEGATIVE
Glucose, UA: NEGATIVE
Nitrite, UA: NEGATIVE
Protein, UA: NEGATIVE
Spec Grav, UA: 1.015
Urobilinogen, UA: 0.2
pH, UA: 6.5

## 2011-08-27 MED ORDER — ATORVASTATIN CALCIUM 20 MG PO TABS: 20.0000 mg | ORAL_TABLET | Freq: Every day | ORAL | Status: DC

## 2011-08-27 NOTE — Progress Notes (Addendum)
Subjective:    Patient ID: Austin [name], male    DOB: [birth year], 56 y.o.   MRN: [id number]  HPI  Patient for complete physical. He has history of pituitary tumor, brainstem CVA, prediabetes, chronic peripheral edema, neurogenic bladder, hypertension, and hypogonadism. Immunizations are up to date. Had prior Pneumovax. Flu vaccine earlier this year. Colonoscopy 2007. Brother had colon cancer age 30 and patient is due for repeat at this time. He apparently had adenomatous polyps at one point but last colonoscopy reportedly clear.. We do not have those reports at this time.  He has ongoing problems with fatigue. On testosterone replacement every other week. This has helped slightly. Thyroid function and cortisol levels have been normal.  Past Medical History  Diagnosis Date  . Hx of colonic polyps   . Hypertension   . Cerebrovascular accident 1994    hx of brainstem stroke  . Pituitary adenoma   . Urinary incontinence   . Venous stasis     edema  . 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 Hypogonadism    Past Surgical History  Procedure Date  . Tracheostomy tube placement 02/1993    removed 04/1993  . Stomach peg 02/1993    removed 5-6 years later    reports that he quit smoking about 18 years ago. His smoking use included Cigarettes. He has a 11 pack-year smoking history. He does not have any smokeless tobacco history on file. He reports that he drinks alcohol. His drug history not on file. family history includes Alcohol abuse in his father and unspecified family member; Arthritis in an unspecified family member; Cancer (age of onset:44) in his brother; Cancer (age of onset:58) in his mother; Cancer (age of onset:60) in his father; Colon cancer in his brother; Hyperlipidemia in an unspecified family member; Hypertension in an unspecified family member; Stroke in an unspecified family member; and Throat cancer in his father. Allergies  Allergen Reactions   . Penicillins     REACTION: rash        Review of Systems  Constitutional: Positive for fatigue. Negative for fever, activity change, appetite change and unexpected weight change.  HENT: Negative for ear pain, congestion and trouble swallowing.   Eyes: Negative for pain and visual disturbance.  Respiratory: Negative for cough, shortness of breath and wheezing.   Cardiovascular: Negative for chest pain and palpitations.  Gastrointestinal: Negative for nausea, vomiting, abdominal pain, diarrhea, constipation, blood in stool, abdominal distention and rectal pain.  Genitourinary: Negative for dysuria, hematuria and testicular pain.  Musculoskeletal: Negative for joint swelling and arthralgias.  Skin: Negative for rash.  Neurological: Negative for dizziness, syncope and headaches.  Hematological: Negative for adenopathy.  Psychiatric/Behavioral: Negative for confusion and dysphoric mood.       Objective:   Physical Exam  Constitutional: He is oriented to person, place, and time. He appears well-developed and well-nourished.       Patient is wheelchair confined  HENT:  Right Ear: External ear normal.  Left Ear: External ear normal.  Mouth/Throat: Oropharynx is clear and moist.  Eyes: Pupils are equal, round, and reactive to light.  Neck: Neck supple. No thyromegaly present.  Cardiovascular: Normal rate and regular rhythm.   Pulmonary/Chest: Effort normal and breath sounds normal. No respiratory distress. He has no wheezes. He has no rales.  Musculoskeletal: He exhibits no edema.  Lymphadenopathy:    He has no cervical adenopathy.  Neurological: He is alert and oriented to person, place, and time.  Skin: No rash noted.  Psychiatric: He has a normal mood and affect.          Assessment & Plan:  #1 health maintenance. Patient needs repeat colonoscopy with family history brother dying age 69 of colon cancer. Last colonoscopy 2007. #2 intermittent palpitations. Only one  associated episode of dizziness. These are very transient and only last a few seconds. Holter monitor if these persist or worsen #3 hyperlipidemia. Start Lipitor 20 mg daily and repeat lipid and hepatic 6-8 weeks  #4 hypogonadism. Continue testosterone replacement #5 hypertension stable at goal  #6 prediabetes. Discussed weight loss and close monitoring #7 Hx pituitary adenoma followed at [facility].  Low testosterone as above but no panhypopituitarism.  On today's visit date of 08/27/2011, the patient was evaluated for power mobility needs. 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 This prevents him from accomplishing ADLs. This cannot be resolved by use of a fitted cane or walker. Patient does not have sufficient arm function to propel a wheelchair or perform mobility related ADLs. This patient would benefit from use of specifically configured power wheelchair and can safely transfer to and from a power wheelchair and operate the controls.

## 2011-09-03 ENCOUNTER — Ambulatory Visit (INDEPENDENT_AMBULATORY_CARE_PROVIDER_SITE_OTHER): Payer: Medicare Other | Admitting: Family Medicine

## 2011-09-03 DIAGNOSIS — Z8639 Personal history of other endocrine, nutritional and metabolic disease: Secondary | ICD-10-CM

## 2011-09-03 DIAGNOSIS — Z862 Personal history of diseases of the blood and blood-forming organs and certain disorders involving the immune mechanism: Secondary | ICD-10-CM

## 2011-09-03 MED ORDER — TESTOSTERONE CYPIONATE 200 MG/ML IM SOLN
200.0000 mg | INTRAMUSCULAR | Status: DC
  Administered 2011-09-03: 200 mg via INTRAMUSCULAR

## 2011-09-17 ENCOUNTER — Ambulatory Visit (INDEPENDENT_AMBULATORY_CARE_PROVIDER_SITE_OTHER): Payer: Medicare Other | Admitting: Family Medicine

## 2011-09-17 ENCOUNTER — Other Ambulatory Visit: Payer: Self-pay | Admitting: Family Medicine

## 2011-09-17 DIAGNOSIS — E291 Testicular hypofunction: Secondary | ICD-10-CM

## 2011-09-17 DIAGNOSIS — E349 Endocrine disorder, unspecified: Secondary | ICD-10-CM

## 2011-09-17 MED ORDER — TESTOSTERONE CYPIONATE 200 MG/ML IM SOLN
200.0000 mg | INTRAMUSCULAR | Status: DC
  Administered 2011-09-17: 200 mg via INTRAMUSCULAR

## 2011-09-30 ENCOUNTER — Ambulatory Visit (INDEPENDENT_AMBULATORY_CARE_PROVIDER_SITE_OTHER): Payer: Medicare Other | Admitting: Family Medicine

## 2011-09-30 ENCOUNTER — Encounter: Payer: Self-pay | Admitting: Internal Medicine

## 2011-09-30 DIAGNOSIS — Z8639 Personal history of other endocrine, nutritional and metabolic disease: Secondary | ICD-10-CM

## 2011-09-30 MED ORDER — TESTOSTERONE CYPIONATE 200 MG/ML IM SOLN
200.0000 mg | INTRAMUSCULAR | Status: DC
  Administered 2011-09-30: 200 mg via INTRAMUSCULAR

## 2011-09-30 MED ORDER — TESTOSTERONE CYPIONATE 200 MG/ML IM SOLN: 200.0000 mg | INTRAMUSCULAR | Status: DC

## 2011-10-03 ENCOUNTER — Telehealth: Payer: Self-pay | Admitting: *Deleted

## 2011-10-03 NOTE — Telephone Encounter (Signed)
Faxed 11 pages as instructed to [facility] for new power wheelchair.  Confirmation received.  Will scan to chart (not scanning OV notes)

## 2011-10-14 ENCOUNTER — Ambulatory Visit: Payer: Medicare Other | Admitting: Internal Medicine

## 2011-10-14 ENCOUNTER — Ambulatory Visit (INDEPENDENT_AMBULATORY_CARE_PROVIDER_SITE_OTHER): Payer: Medicare Other | Admitting: Family Medicine

## 2011-10-14 DIAGNOSIS — E291 Testicular hypofunction: Secondary | ICD-10-CM

## 2011-10-14 MED ORDER — TESTOSTERONE CYPIONATE 200 MG/ML IM SOLN
200.0000 mg | Freq: Once | INTRAMUSCULAR | Status: AC
  Administered 2011-10-14: 200 mg via INTRAMUSCULAR

## 2011-10-21 ENCOUNTER — Other Ambulatory Visit (INDEPENDENT_AMBULATORY_CARE_PROVIDER_SITE_OTHER): Payer: Medicare Other

## 2011-10-21 DIAGNOSIS — E785 Hyperlipidemia, unspecified: Secondary | ICD-10-CM

## 2011-10-21 LAB — LIPID PANEL
HDL: 41.7 mg/dL (ref >39.00)
VLDL: 41.8 mg/dL — ABNORMAL HIGH (ref 0.0–40.0)

## 2011-10-21 LAB — HEPATIC FUNCTION PANEL
Alkaline Phosphatase: 85 U/L (ref 39–117)
Bilirubin, Direct: 0 mg/dL (ref 0.0–0.3)
Total Bilirubin: 0.5 mg/dL (ref 0.3–1.2)
Total Protein: 7.7 g/dL (ref 6.0–8.3)

## 2011-10-22 NOTE — Progress Notes (Signed)
Quick Note:  Pt informed and wife ______

## 2011-10-28 ENCOUNTER — Ambulatory Visit (INDEPENDENT_AMBULATORY_CARE_PROVIDER_SITE_OTHER): Payer: Medicare Other | Admitting: Family Medicine

## 2011-10-28 DIAGNOSIS — E291 Testicular hypofunction: Secondary | ICD-10-CM

## 2011-10-28 DIAGNOSIS — E349 Endocrine disorder, unspecified: Secondary | ICD-10-CM

## 2011-10-28 MED ORDER — TESTOSTERONE CYPIONATE 200 MG/ML IM SOLN
200.0000 mg | INTRAMUSCULAR | Status: DC
  Administered 2011-10-28: 200 mg via INTRAMUSCULAR

## 2011-11-04 ENCOUNTER — Encounter: Payer: Self-pay | Admitting: Internal Medicine

## 2011-11-05 ENCOUNTER — Encounter: Payer: Self-pay | Admitting: Internal Medicine

## 2011-11-05 ENCOUNTER — Ambulatory Visit (INDEPENDENT_AMBULATORY_CARE_PROVIDER_SITE_OTHER): Payer: Medicare Other | Admitting: Internal Medicine

## 2011-11-05 VITALS — BP 140/80 | HR 80

## 2011-11-05 DIAGNOSIS — Z86010 Personal history of colonic polyps: Secondary | ICD-10-CM

## 2011-11-05 DIAGNOSIS — Z800 Family history of malignant neoplasm of digestive organs: Secondary | ICD-10-CM

## 2011-11-05 DIAGNOSIS — Z1211 Encounter for screening for malignant neoplasm of colon: Secondary | ICD-10-CM

## 2011-11-05 NOTE — Progress Notes (Signed)
Subjective:    Patient ID: Austin [name], male    DOB: [birth year], 56 y.o.   MRN: [id number]  HPI 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 Caryl Never for history of colon polyps and possible surveillance colonoscopy. The patient is without symptoms today, and he is accompanied by his wife and 65-month-old grandson. He reports his last colonoscopy was in 2006, in [place]. He reports a history of adenomatous colon polyps on his initial colonoscopy, but subsequent exams have been "normal". Currently no abdominal pain, nausea, vomiting. No rectal bleeding or melena. No change in appetite or weight loss. No fevers or chills. He does remain on clopidogrel which was started years ago given his history of neurovascular accident. He is followed by neurology, but reports that his PCP manages his clopidogrel at this point.  Review of Systems As per history of present illness, otherwise negative  Patient Active Problem List  Diagnoses  . HYPOGONADISM  . HYPERTENSION  . NEUROGENIC BLADDER  . OTHER SEBORRHEIC DERMATITIS  . WEAKNESS  . FACIAL WEAKNESS  . EDEMA  . PREDIABETES  . PITUITARY NEOPLASM, HX OF  . CEREBROVASCULAR ACCIDENT, HX OF  . COLONIC POLYPS, HX OF   Past Surgical History  Procedure Date  . Tracheostomy tube placement 02/1993    removed 04/1993  . Stomach peg 02/1993    removed 5-6 years later  . Cholecystectomy   . Pituitary surgery 2007  . Gamma knife radiation surgery 05/2010   Current Outpatient Prescriptions  Medication Sig Dispense Refill  . Aspirin Buf,CaCarb-MgCarb-MgO, (ADPRIN B) 325 MG TABS Take 325 mg by mouth daily.        Marland Kitchen atorvastatin (LIPITOR) 20 MG tablet Take 1 tablet (20 mg total) by mouth daily.   90 tablet  3  . escitalopram (LEXAPRO) 10 MG tablet Take 1 tablet (10 mg total) by mouth daily.  90 tablet  3  . lisinopril-hydrochlorothiazide (PRINZIDE,ZESTORETIC) 10-12.5 MG per tablet TAKE ONE TABLET BY MOUTH BY MOUTH EVERY DAY  90 tablet  3  . Multiple Vitamins-Minerals (CENTRUM SILVER ULTRA MENS) TABS Take by mouth daily.        . nitrofurantoin (MACRODANTIN) 100 MG capsule TAKE ONE CAPSULE BY MOUTH EVERY DAY  90 capsule  3  . Omega-3 Fatty Acids (FISH OIL) 1000 MG CAPS Take 1 capsule by mouth daily.      Marland Kitchen oxybutynin (DITROPAN) 5 MG tablet TAKE ONE TABLET BY MOUTH TWICE DAILY  60 tablet  11  . PLAVIX 75 MG tablet TAKE ONE TABLET BY MOUTH EVERY DAY  90 each  2  . testosterone cypionate (DEPO-TESTOSTERONE) 200 MG/ML injection Inject 1 mL (200 mg total) into the muscle every 14 (fourteen) days.  10 mL  0  . traZODone (DESYREL) 100 MG tablet TAKE ONE TABLET BY MOUTH EVERY DAY  30 tablet  11  . triamcinolone cream (KENALOG) 0.1 % APPLY  TO AFFECTED AREA TWICE DAILY AS NEEDED  15 g  5   Current Facility-Administered Medications  Medication Dose Route Frequency Provider Last Rate Last Dose  . testosterone cypionate (DEPOTESTOTERONE CYPIONATE) injection 200 mg  200 mg Intramuscular Q28 days Kristian Covey, MD   200 mg at 10/08/10 1453  . testosterone cypionate (DEPOTESTOTERONE CYPIONATE) injection 200 mg  200 mg Intramuscular Q14 Days Kristian Covey, MD   200 mg at 10/22/10 1224  . testosterone cypionate (DEPOTESTOTERONE CYPIONATE) injection 200 mg  200 mg Intramuscular Q28 days Kristian Covey, MD   200 mg at 11/26/10 1254  . testosterone cypionate (DEPOTESTOTERONE CYPIONATE) injection 200 mg  200 mg Intramuscular Q14 Days Kristian Covey, MD   200 mg at 12/10/10 1225  . testosterone cypionate (DEPOTESTOTERONE CYPIONATE) injection 200 mg  200 mg Intramuscular Q28 days Kristian Covey, MD   200 mg at 12/24/10 1244  . testosterone cypionate (DEPOTESTOTERONE CYPIONATE) injection 200 mg   200 mg Intramuscular Q14 Days Kristian Covey, MD   200 mg at 01/07/11 1249  . testosterone cypionate (DEPOTESTOTERONE CYPIONATE) injection 200 mg  200 mg Intramuscular Q14 Days Kristian Covey, MD   200 mg at 01/23/11 0915  . testosterone cypionate (DEPOTESTOTERONE CYPIONATE) injection 200 mg  200 mg Intramuscular Q28 days Kristian Covey, MD   200 mg at 02/19/11 1249  . testosterone cypionate (DEPOTESTOTERONE CYPIONATE) injection 200 mg  200 mg Intramuscular Q14 Days Kristian Covey, MD   200 mg at 03/04/11 1259  . testosterone cypionate (DEPOTESTOTERONE CYPIONATE) injection 200 mg  200 mg Intramuscular Q28 days Kristian Covey, MD   200 mg at 03/20/11 1341  . testosterone cypionate (DEPOTESTOTERONE CYPIONATE) injection 200 mg  200 mg Intramuscular Q28 days Kristian Covey, MD   200 mg at 04/03/11 1215  . testosterone cypionate (DEPOTESTOTERONE CYPIONATE) injection 200 mg  200 mg Intramuscular Q28 days Kristian Covey, MD   200 mg at 05/02/11 1211  . testosterone cypionate (DEPOTESTOTERONE CYPIONATE) injection 200 mg  200 mg Intramuscular Q14 Days Kristian Covey, MD   200 mg at 05/16/11 1211  . testosterone cypionate (DEPOTESTOTERONE CYPIONATE) injection 200 mg  200 mg Intramuscular Q28 days Kristian Covey, MD   200 mg at 05/27/11 1045  . testosterone cypionate (DEPOTESTOTERONE CYPIONATE) injection 200 mg  200 mg Intramuscular Q14 Days Kristian Covey, MD   200 mg at 06/10/11 1225  . testosterone cypionate (DEPOTESTOTERONE CYPIONATE) injection 200 mg  200 mg Intramuscular Q14 Days Kristian Covey, MD   200 mg at 07/09/11 1220  . testosterone cypionate (DEPOTESTOTERONE CYPIONATE) injection 200 mg  200 mg Intramuscular Q14 Days Kristian Covey, MD   200 mg at 07/23/11 1218  . testosterone cypionate (DEPOTESTOTERONE CYPIONATE) injection 200 mg  200 mg Intramuscular Q28 days Kristian Covey, MD   200 mg at 08/19/11 1026  . testosterone cypionate (DEPOTESTOTERONE CYPIONATE)  injection 200 mg  200 mg Intramuscular Q28 days Kristian Covey, MD   200 mg at 09/03/11 1233  . testosterone cypionate (DEPOTESTOTERONE CYPIONATE) injection 200 mg  200 mg Intramuscular Q14 Days Kristian Covey, MD   200 mg at 09/17/11 1216  . testosterone cypionate (DEPOTESTOTERONE CYPIONATE) injection 200 mg  200 mg Intramuscular Q28 days Kristian Covey, MD   200 mg at 09/30/11 1217  . testosterone cypionate (DEPOTESTOTERONE CYPIONATE) injection 200 mg  200 mg Intramuscular Q28 days Kristian Covey, MD   200 mg at 10/28/11 1229   Allergies  Allergen Reactions  . Penicillins     REACTION: rash   Family History  Problem Relation Age of Onset  . Alcohol abuse Father   . Arthritis Mother   . Colon cancer Brother   . Arthritis Maternal Grandmother   . Hyperlipidemia Mother   . Hypertension Brother   . Stroke Paternal  Grandmother   . Throat cancer Father   . Esophageal cancer Father 60  . Alcohol abuse Father   . Uterine cancer Mother 35   History   Social History  . Marital Status: Married    Spouse Name: N/A    Number of Children: 3  . Years of Education: N/A   Occupational History  . disabled    Social History Main Topics  . Smoking status: Former Smoker -- 0.5 packs/day for 22 years    Types: Cigarettes    Quit date: 09/24/1992  . Smokeless tobacco: None  . Alcohol Use: 0.0 oz/week     rarley  . Drug Use: None  . Sexually Active: None   Other Topics Concern  . 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 Oropharynx is clear and moist. No oropharyngeal exudate. Conjunctivae are normal. Pupils are equal round and reactive to light. No scleral icterus. Neck: Neck supple. Trachea midline. Prior tracheostomy scar Cardiovascular: Normal rate, regular rhythm and intact  distal pulses. No M/R/G Pulmonary/chest: Effort normal and breath sounds normal. No wheezing, rales or rhonchi. Abdominal: Soft, nontender, nondistended. Bowel sounds active throughout. There are no masses palpable. No hepatosplenomegaly. Extremities: no clubbing, cyanosis, or edema Skin: Skin is warm and dry. No rashes noted. Psychiatric: Normal mood and affect. Behavior is normal.  CBC    Component Value Date/Time   WBC 12.5* 08/19/2011 0933   RBC 5.19 08/19/2011 0933   HGB 14.8 08/19/2011 0933   HCT 44.0 08/19/2011 0933   PLT 268.0 08/19/2011 0933   MCV 84.8 08/19/2011 0933   MCHC 33.6 08/19/2011 0933   RDW 17.8* 08/19/2011 0933   LYMPHSABS 1.2 08/19/2011 0933   MONOABS 0.9 08/19/2011 0933   EOSABS 0.2 08/19/2011 0933   BASOSABS 0.1 08/19/2011 0933   CMP     Component Value Date/Time   NA 141 08/19/2011 0933   K 4.3 08/19/2011 0933   CL 105 08/19/2011 0933   CO2 27 08/19/2011 0933   GLUCOSE 109* 08/19/2011 0933   BUN 12 08/19/2011 0933   CREATININE 0.8 08/19/2011 0933   CALCIUM 9.3 08/19/2011 0933   PROT 7.7 10/21/2011 0931   ALBUMIN 3.6 10/21/2011 0931   AST 22 10/21/2011 0931   ALT 33 10/21/2011 0931   ALKPHOS 85 10/21/2011 0931   BILITOT 0.5 10/21/2011 0931   GFRNONAA 160.69 04/03/2010 1043   Previous endoscopy Colonoscopy, date 08/21/2004 -- exam to the cecum with an excellent prep. 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 Caryl Never for history of colon polyps and possible surveillance colonoscopy  1. History of colorectal polyps -- given the patient's personal history of colon polyps, and his strong family history of colon polyps/cancer, repeat  surveillance colonoscopy is indicated at this time. His last exam was in 2006, and has surveillance interval is recommended to be every 5 years. He is asymptomatic at present, and requests that this exam be performed in June or July 2013. Given his physical limitations, prepping  is difficult, and his wife has asked that this be performed in the summer as at this time they will not be helping to care for their grandchildren. She feels a summer procedure will allow her to be more involved in helping him prep, which is very reasonable. He'll be scheduled for colonoscopy to be performed in the hospital as an outpatient in July 2013. Should symptoms arise prior this, I have asked that he notify our office.

## 2011-11-05 NOTE — Patient Instructions (Signed)
You have been scheduled for a colonoscopy on 02/24/2012 @ 8:30am at [facility]. Please arrive at 7:00am at outpatient registration.   We have sent the following medications to your pharmacy for you to pick up at your convenience: moviprep, you were given instructions today at your office visit.

## 2011-11-10 ENCOUNTER — Telehealth: Payer: Self-pay | Admitting: Internal Medicine

## 2011-11-10 ENCOUNTER — Other Ambulatory Visit: Payer: Self-pay | Admitting: Gastroenterology

## 2011-11-10 DIAGNOSIS — Z85858 Personal history of malignant neoplasm of other endocrine glands: Secondary | ICD-10-CM

## 2011-11-10 MED ORDER — PEG-KCL-NACL-NASULF-NA ASC-C 100 G PO SOLR: 1.0000 | Freq: Once | ORAL | Status: DC

## 2011-11-10 NOTE — Telephone Encounter (Signed)
sent moviprep to pt's pharmacy. Pharmacy to notify pt.

## 2011-11-11 ENCOUNTER — Ambulatory Visit (INDEPENDENT_AMBULATORY_CARE_PROVIDER_SITE_OTHER): Payer: Medicare Other | Admitting: *Deleted

## 2011-11-11 DIAGNOSIS — E291 Testicular hypofunction: Secondary | ICD-10-CM

## 2011-11-11 DIAGNOSIS — E349 Endocrine disorder, unspecified: Secondary | ICD-10-CM

## 2011-11-11 MED ORDER — TESTOSTERONE CYPIONATE 200 MG/ML IM SOLN
200.0000 mg | INTRAMUSCULAR | Status: DC
  Administered 2011-11-11: 200 mg via INTRAMUSCULAR

## 2011-11-12 ENCOUNTER — Other Ambulatory Visit: Payer: Self-pay | Admitting: Family Medicine

## 2011-11-25 ENCOUNTER — Ambulatory Visit (INDEPENDENT_AMBULATORY_CARE_PROVIDER_SITE_OTHER): Payer: Medicare Other | Admitting: Family Medicine

## 2011-11-25 DIAGNOSIS — E291 Testicular hypofunction: Secondary | ICD-10-CM

## 2011-11-25 DIAGNOSIS — E349 Endocrine disorder, unspecified: Secondary | ICD-10-CM

## 2011-11-25 MED ORDER — "NEEDLE (DISP) 18G X 1-1/2"" MISC": Status: DC

## 2011-11-25 MED ORDER — "NEEDLE (DISP) 22G X 1-1/2"" MISC": Status: DC

## 2011-11-25 MED ORDER — TESTOSTERONE CYPIONATE 200 MG/ML IM SOLN
200.0000 mg | INTRAMUSCULAR | Status: DC
  Administered 2011-11-25 (×2): 200 mg via INTRAMUSCULAR

## 2011-12-09 ENCOUNTER — Telehealth: Payer: Self-pay | Admitting: *Deleted

## 2011-12-09 MED ORDER — SYRINGE (DISPOSABLE) 3 ML MISC: Status: DC

## 2011-12-09 NOTE — Telephone Encounter (Signed)
Pt would like to speak to [place] re: pt's syringes for his testosterone.  The size they are using is the wrong size.?

## 2012-01-01 ENCOUNTER — Telehealth: Payer: Self-pay | Admitting: Family Medicine

## 2012-01-01 DIAGNOSIS — N319 Neuromuscular dysfunction of bladder, unspecified: Secondary | ICD-10-CM

## 2012-01-01 MED ORDER — CIPROFLOXACIN HCL 500 MG PO TABS: 500.0000 mg | ORAL_TABLET | Freq: Two times a day (BID) | ORAL | Status: AC

## 2012-01-01 NOTE — Telephone Encounter (Signed)
Has high risk for resistant infection with history of in and out caths. We need to get urine sample to send for culture Pending culture, start Cipro 500 mg twice daily for 7 days

## 2012-01-01 NOTE — Telephone Encounter (Signed)
Pt wife is requesting abx for bladder inf for her husband.pharm [facility]. Pt wife will bring sample if needed.

## 2012-01-01 NOTE — Telephone Encounter (Signed)
Pt wife informed Rx sent and culture ordered, they will bring urine tomorrow

## 2012-01-01 NOTE — Telephone Encounter (Signed)
Please advise 

## 2012-01-02 ENCOUNTER — Other Ambulatory Visit (INDEPENDENT_AMBULATORY_CARE_PROVIDER_SITE_OTHER): Payer: Medicare Other

## 2012-01-02 DIAGNOSIS — N319 Neuromuscular dysfunction of bladder, unspecified: Secondary | ICD-10-CM

## 2012-01-02 LAB — POCT URINALYSIS DIPSTICK: Spec Grav, UA: 1.02

## 2012-01-05 LAB — URINE CULTURE

## 2012-01-08 ENCOUNTER — Other Ambulatory Visit: Payer: Self-pay | Admitting: Family Medicine

## 2012-01-14 ENCOUNTER — Other Ambulatory Visit: Payer: Self-pay | Admitting: Family Medicine

## 2012-01-14 ENCOUNTER — Other Ambulatory Visit: Payer: Self-pay | Admitting: *Deleted

## 2012-01-14 MED ORDER — NITROFURANTOIN MACROCRYSTAL 100 MG PO CAPS: 100.0000 mg | ORAL_CAPSULE | Freq: Every day | ORAL | Status: DC

## 2012-01-20 ENCOUNTER — Telehealth: Payer: Self-pay | Admitting: Family Medicine

## 2012-01-20 DIAGNOSIS — E291 Testicular hypofunction: Secondary | ICD-10-CM

## 2012-01-20 MED ORDER — TESTOSTERONE CYPIONATE 200 MG/ML IM SOLN: 200.0000 mg | INTRAMUSCULAR | Status: DC

## 2012-01-20 NOTE — Telephone Encounter (Signed)
Pharm is unable to get testosterone 1 ml they can get 10 ml. Pt needs call into [facility]

## 2012-01-20 NOTE — Telephone Encounter (Signed)
Rx called in 

## 2012-01-23 ENCOUNTER — Other Ambulatory Visit: Payer: Self-pay | Admitting: Family Medicine

## 2012-01-28 ENCOUNTER — Ambulatory Visit: Payer: Medicare Other | Admitting: Family Medicine

## 2012-02-02 ENCOUNTER — Encounter: Payer: Self-pay | Admitting: Family Medicine

## 2012-02-02 ENCOUNTER — Ambulatory Visit (INDEPENDENT_AMBULATORY_CARE_PROVIDER_SITE_OTHER): Payer: Medicare Other | Admitting: Family Medicine

## 2012-02-02 VITALS — BP 140/90 | Temp 98.0°F

## 2012-02-02 DIAGNOSIS — E291 Testicular hypofunction: Secondary | ICD-10-CM

## 2012-02-02 DIAGNOSIS — R609 Edema, unspecified: Secondary | ICD-10-CM

## 2012-02-02 DIAGNOSIS — I10 Essential (primary) hypertension: Secondary | ICD-10-CM

## 2012-02-02 DIAGNOSIS — R600 Localized edema: Secondary | ICD-10-CM

## 2012-02-02 LAB — TESTOSTERONE: Testosterone: 574.95 ng/dL (ref 350.00–890.00)

## 2012-02-02 NOTE — Progress Notes (Signed)
  Subjective:    Patient ID: Austin [name], male    DOB: [birth year], 56 y.o.   MRN: [id number]  HPI  Medical followup. Patient has history of pituitary tumor, CVA, neurogenic bladder, low testosterone, hypertension, chronic peripheral edema. Continues to complain of fatigue. Thyroid function and cortisol normal. Recent testosterone levels were low at 203. We increased his intramuscular testosterone that time. Sleeping generally fairly well. Increased peripheral edema recently. Uses support hose in wintertime. Takes lisinopril HCTZ. Has Lasix at home but rarely takes. Denies dyspnea. No chest pain.  Past Medical History  Diagnosis Date  . Hx of colonic polyps   . Hypertension   . Cerebrovascular accident 1994    hx of brainstem stroke  . Pituitary adenoma   . Urinary incontinence   . Venous stasis     edema  . 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 Hypogonadism    Past Surgical History  Procedure Date  . Tracheostomy tube placement 02/1993    removed 04/1993  . Stomach peg 02/1993    removed 5-6 years later  . Cholecystectomy   . Pituitary surgery 2007  . Gamma knife radiation surgery 05/2010    reports that he quit smoking about 19 years ago. His smoking use included Cigarettes. He has a 11 pack-year smoking history. He does not have any smokeless tobacco history on file. He reports that he drinks alcohol. His drug history not on file. family history includes Alcohol abuse in his fathers; Arthritis in his maternal grandmother and mother; Colon cancer in his brother; Esophageal cancer (age of onset:60) in his father; Hyperlipidemia in his mother; Hypertension in his brother; Stroke in his paternal grandmother; Throat cancer in his father; and Uterine cancer (age of onset:58) in his mother. Allergies  Allergen Reactions  . Penicillins     REACTION: rash      Review of Systems  Constitutional: Positive for fatigue. Negative for appetite change and  unexpected weight change.  Eyes: Negative for visual disturbance.  Respiratory: Negative for cough and shortness of breath.   Cardiovascular: Positive for leg swelling. Negative for chest pain and palpitations.  Gastrointestinal: Negative for abdominal pain.  Neurological: Negative for dizziness and headaches.  Hematological: Negative for adenopathy. Does not bruise/bleed easily.  Psychiatric/Behavioral: Negative for dysphoric mood.       Objective:   Physical Exam  Constitutional: He appears well-developed and well-nourished.  Neck: Neck supple. No thyromegaly present.  Cardiovascular: Normal rate and regular rhythm.   Pulmonary/Chest: Effort normal and breath sounds normal. No respiratory distress. He has no wheezes. He has no rales.  Musculoskeletal: He exhibits edema.       Trace pitting edema legs bilaterally          Assessment & Plan:  #1 low testosterone. Repeat testosterone level. #2 bilateral leg edema. Suspect largely venous stasis. Recent albumin normal. Increase furosemide 40 mg once daily for 3 days along with potassium tablet and then discontinue. Support hose as tolerated. Frequent leg elevation. #3 hypertension. Marginal control today. Continue close monitoring.

## 2012-02-02 NOTE — Patient Instructions (Addendum)
Start back Lasix (furosemide) once daily for 3 days and also take one potassium pill daily.

## 2012-02-03 NOTE — Progress Notes (Signed)
Quick Note:  Pt wife informed ______ 

## 2012-02-09 ENCOUNTER — Other Ambulatory Visit: Payer: Self-pay | Admitting: Internal Medicine

## 2012-02-09 NOTE — Telephone Encounter (Signed)
R. burchette pt

## 2012-02-16 ENCOUNTER — Other Ambulatory Visit: Payer: Self-pay | Admitting: Family Medicine

## 2012-02-19 ENCOUNTER — Other Ambulatory Visit: Payer: Self-pay | Admitting: *Deleted

## 2012-02-19 MED ORDER — CLOPIDOGREL BISULFATE 75 MG PO TABS: 75.0000 mg | ORAL_TABLET | Freq: Every day | ORAL | Status: DC

## 2012-02-19 MED ORDER — ATORVASTATIN CALCIUM 20 MG PO TABS: 20.0000 mg | ORAL_TABLET | Freq: Every day | ORAL | Status: DC

## 2012-02-19 MED ORDER — LISINOPRIL-HYDROCHLOROTHIAZIDE 10-12.5 MG PO TABS: 1.0000 | ORAL_TABLET | Freq: Every day | ORAL | Status: DC

## 2012-02-19 MED ORDER — OXYBUTYNIN CHLORIDE 5 MG PO TABS: 5.0000 mg | ORAL_TABLET | Freq: Two times a day (BID) | ORAL | Status: DC

## 2012-02-19 MED ORDER — TRAZODONE HCL 100 MG PO TABS: 100.0000 mg | ORAL_TABLET | Freq: Every day | ORAL | Status: DC

## 2012-02-19 MED ORDER — NITROFURANTOIN MACROCRYSTAL 100 MG PO CAPS: 100.0000 mg | ORAL_CAPSULE | Freq: Every day | ORAL | Status: DC

## 2012-02-19 MED ORDER — ESCITALOPRAM OXALATE 10 MG PO TABS: 10.0000 mg | ORAL_TABLET | Freq: Every day | ORAL | Status: DC

## 2012-02-23 ENCOUNTER — Telehealth (INDEPENDENT_AMBULATORY_CARE_PROVIDER_SITE_OTHER): Payer: Medicare Other | Admitting: Family Medicine

## 2012-02-23 ENCOUNTER — Encounter (HOSPITAL_COMMUNITY): Payer: Self-pay | Admitting: *Deleted

## 2012-02-23 DIAGNOSIS — N319 Neuromuscular dysfunction of bladder, unspecified: Secondary | ICD-10-CM

## 2012-02-23 LAB — POCT URINALYSIS DIPSTICK
Blood, UA: NEGATIVE
Ketones, UA: NEGATIVE
Protein, UA: NEGATIVE
Spec Grav, UA: 1.015
Urobilinogen, UA: 0.2
pH, UA: 7

## 2012-02-23 NOTE — Telephone Encounter (Signed)
Pt wife will drop off urine samples to check for bladder inf.

## 2012-02-23 NOTE — H&P (Signed)
Outpatient Procedure Note  SUBJECTIVE: HPI Patient seen in GI clinic in March 2013 for discussion of screening colon.  He has hx of colon polyps and is due for surveillance.  No complaints now.  No abd pain.  Tolerated prep.  Review of Systems  As per history of present illness, otherwise negative   Past Medical History  Diagnosis Date  . Hx of colonic polyps   . Hypertension   . Pituitary adenoma   . Urinary incontinence   . Venous stasis     edema  . 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 Hypogonadism   . Cerebrovascular accident 1994    hx of brainstem stroke, residual diminished lung capacity  . Neuromuscular disorder     quadraplegic    No current facility-administered medications for this encounter.   Current Outpatient Prescriptions  Medication Sig Dispense Refill  . Aspirin Buf,CaCarb-MgCarb-MgO, (ADPRIN B) 325 MG TABS Take 325 mg by mouth daily.       Marland Kitchen atorvastatin (LIPITOR) 20 MG tablet Take 1 tablet (20 mg total) by mouth daily.  90 tablet  3  . ciprofloxacin (CIPRO) 500 MG tablet       . clopidogrel (PLAVIX) 75 MG tablet Take 1 tablet (75 mg total) by mouth daily.  90 tablet  3  . escitalopram (LEXAPRO) 10 MG tablet Take 1 tablet (10 mg total) by mouth daily.  90 tablet  3  . ketoconazole (NIZORAL) 2 % cream APPLY  TO AFFECTED AREA TWICE DAILY AS NEEDED  60 g  0  . KLOR-CON M20 20 MEQ tablet TAKE ONE TABLET BY MOUTH EVERY DAY  30 each  11  . lisinopril-hydrochlorothiazide (PRINZIDE,ZESTORETIC) 10-12.5 MG per tablet Take 1 tablet by mouth daily.  90 tablet  3  . Multiple Vitamins-Minerals (CENTRUM SILVER ULTRA MENS) TABS Take by mouth daily.        Marland Kitchen NEEDLE, DISP, 18 G (B-D BLUNT FILL NEEDLE) 18G X 1-1/2" MISC Inject Testosterone IM as directed every 14 days  100 each  3  . NEEDLE, DISP, 22 G (B-D HYPODERMIC NEEDLE 22GX1.5") 22G X 1-1/2" MISC Inject testosterone IM every 14 days as directed  100 each  3  . nitrofurantoin  (MACRODANTIN) 100 MG capsule Take 1 capsule (100 mg total) by mouth daily.  90 capsule  3  . Omega-3 Fatty Acids (FISH OIL) 1000 MG CAPS Take 1 capsule by mouth 2 (two) times daily.       Marland Kitchen oxybutynin (DITROPAN) 5 MG tablet Take 1 tablet (5 mg total) by mouth 2 (two) times daily.  180 tablet  3  . peg 3350 powder (MOVIPREP) SOLR Take 1 kit (100 g total) by mouth once.  1 kit  0  . Syringe, Disposable, (B-D SYRINGE LUER-LOK 3CC) 3 ML MISC Inject 1 ml Testosterone every 14 days  100 each  3  . testosterone cypionate (DEPO-TESTOSTERONE) 200 MG/ML injection Inject 1 mL (200 mg total) into the muscle every 14 (fourteen) days.  10 mL  0  . traZODone (DESYREL) 100 MG tablet Take 1 tablet (100 mg total) by mouth daily.  90 tablet  3  . triamcinolone cream (KENALOG) 0.1 % APPLY  TO AFFECTED AREA TWICE DAILY AS NEEDED  15 g  5    Allergies  Allergen Reactions  . Penicillins     REACTION: rash    Family History  Problem Relation Age of Onset  . Alcohol abuse Father   .  Arthritis Mother   . Colon cancer Brother   . Arthritis Maternal Grandmother   . Hyperlipidemia Mother   . Hypertension Brother   . Stroke Paternal Grandmother   . Throat cancer Father   . Esophageal cancer Father 22  . Alcohol abuse Father   . Uterine cancer Mother 36    History  Substance Use Topics  . Smoking status: Former Smoker -- 0.5 packs/day for 22 years    Types: Cigarettes    Quit date: 09/24/1992  . Smokeless tobacco: Not on file  . Alcohol Use: 0.0 oz/week     rarley    OBJECTIVE: BP 148/99  Temp 98.3 F (36.8 C) (Oral)  Resp 19  Ht 6' (1.829 m)  Wt 180 lb (81.647 kg)  BMI 24.41 kg/m2  SpO2 95% Gen: awake, alert, NAD HEENT: anicteric, op clear, healed trach scar CV: RRR, no mrg Pulm: CTA b/l Abd: soft, NT, mild distended, +BS throughout Ext: no c/c/e    ASSESSMENT AND PLAN: 56 yo with hx of colon polyps presenting for surveillance colonoscopy.  1. Surveillance colon given hx of polyps --  .The nature of the procedure, as well as the risks, benefits, and alternatives were carefully and thoroughly reviewed with the patient. Ample time for discussion and questions allowed. The patient understood, was satisfied, and agreed to proceed.

## 2012-02-23 NOTE — Telephone Encounter (Signed)
FYI, I did say OK to this.

## 2012-02-24 ENCOUNTER — Ambulatory Visit (HOSPITAL_COMMUNITY)
Admission: RE | Admit: 2012-02-24 | Discharge: 2012-02-24 | Disposition: A | Discharge status: Home / Self Care | Payer: Medicare Other | Source: Ambulatory Visit | Attending: Internal Medicine | Admitting: Internal Medicine

## 2012-02-24 ENCOUNTER — Encounter (HOSPITAL_COMMUNITY)
Admission: RE | Disposition: A | Discharge status: Home / Self Care | Payer: Self-pay | Source: Ambulatory Visit | Attending: Internal Medicine

## 2012-02-24 ENCOUNTER — Encounter (HOSPITAL_COMMUNITY): Payer: Self-pay | Admitting: Anesthesiology

## 2012-02-24 ENCOUNTER — Ambulatory Visit (HOSPITAL_COMMUNITY): Payer: Medicare Other | Admitting: Anesthesiology

## 2012-02-24 ENCOUNTER — Encounter (HOSPITAL_COMMUNITY): Payer: Self-pay | Admitting: Internal Medicine

## 2012-02-24 DIAGNOSIS — Z79899 Other long term (current) drug therapy: Secondary | ICD-10-CM | POA: Insufficient documentation

## 2012-02-24 DIAGNOSIS — Z8673 Personal history of transient ischemic attack (TIA), and cerebral infarction without residual deficits: Secondary | ICD-10-CM | POA: Insufficient documentation

## 2012-02-24 DIAGNOSIS — D126 Benign neoplasm of colon, unspecified: Secondary | ICD-10-CM | POA: Insufficient documentation

## 2012-02-24 DIAGNOSIS — Z7982 Long term (current) use of aspirin: Secondary | ICD-10-CM | POA: Insufficient documentation

## 2012-02-24 DIAGNOSIS — I10 Essential (primary) hypertension: Secondary | ICD-10-CM | POA: Insufficient documentation

## 2012-02-24 DIAGNOSIS — Z86010 Personal history of colonic polyps: Secondary | ICD-10-CM

## 2012-02-24 DIAGNOSIS — Z1211 Encounter for screening for malignant neoplasm of colon: Secondary | ICD-10-CM

## 2012-02-24 HISTORY — DX: Myoneural disorder, unspecified: G70.9

## 2012-02-24 SURGERY — COLONOSCOPY WITH PROPOFOL
Anesthesia: Monitor Anesthesia Care

## 2012-02-24 MED ORDER — PROMETHAZINE HCL 25 MG/ML IJ SOLN: 6.2500 mg | INTRAMUSCULAR | Status: DC | PRN

## 2012-02-24 MED ORDER — LACTATED RINGERS IV SOLN
INTRAVENOUS | Status: DC
  Administered 2012-02-24: 08:00:00 via INTRAVENOUS

## 2012-02-24 MED ORDER — LACTATED RINGERS IV SOLN
INTRAVENOUS | Status: DC
  Administered 2012-02-24: 1000 mL via INTRAVENOUS

## 2012-02-24 MED ORDER — PROPOFOL 10 MG/ML IV EMUL
INTRAVENOUS | Status: DC | PRN
  Administered 2012-02-24: 75 ug/kg/min via INTRAVENOUS

## 2012-02-24 SURGICAL SUPPLY — 21 items

## 2012-02-24 NOTE — Transfer of Care (Signed)
Immediate Anesthesia Transfer of Care Note  Patient: Austin [name]  Procedure(s) Performed: Procedure(s) (LRB): COLONOSCOPY WITH PROPOFOL (N/A)  Patient Location: PACU  Anesthesia Type: MAC  Level of Consciousness: awake and oriented  Airway & Oxygen Therapy: Patient Spontanous Breathing  Post-op Assessment: Report given to PACU RN and Post -op Vital signs reviewed and stable  Post vital signs: Reviewed and stable  Complications: No apparent anesthesia complications

## 2012-02-24 NOTE — Op Note (Signed)
[facility] [address] [place], [place]  [address]  COLONOSCOPY PROCEDURE REPORT  PATIENT:  Austin [name], Austin [name]  MR#:  [id number] BIRTHDATE:  Jan 09, 1956, 56 yrs. old  GENDER:  male ENDOSCOPIST:  Carie Caddy. Aryia Delira, MD REF. BY:  Evelena Peat, M.D. PROCEDURE DATE:  02/24/2012 PROCEDURE:  Colonoscopy with snare polypectomy, Colon with cold biopsy polypectomy ASA CLASS:  Class III INDICATIONS:  surveillance and high-risk screening MEDICATIONS:   MAC sedation, administered by CRNA, See Anesthesia Report.  DESCRIPTION OF PROCEDURE:   After the risks benefits and alternatives of the procedure were thoroughly explained, informed consent was obtained.  Digital rectal exam was performed and revealed moderate external hemorrhoids.   The Pentax Colonoscope [id number] endoscope was introduced through the anus and advanced to the terminal ileum which was intubated for a short distance, without limitations.  The quality of the prep was good, using MoviPrep.  The instrument was then slowly withdrawn as the colon was fully examined. <<PROCEDUREIMAGES>>  FINDINGS:  The terminal ileum appeared normal.  Five sessile polyps measuring 2 - 5 mm were found in the ascending colon. Four polyps were removed using cold biopsy forceps. One polyp was snared without cautery. Retrieval was successful. Two polyps were found in the descending colon. The largest polyp was 15 mm and snared, then cauterized with monopolar cautery. Retrieval was successful, though polyp cut with cold snare after removal to facilitate retrieval.   There was a 2 mm sessile polyp removed with cold snare.  There were multiple polyps (9 in total), all sessile and measuring 2 - 6 mm  identified and removed in the rectum and distal sigmoid colon.  Polyps were removed with cold snare and retrieved.   Retroflexed views in the rectum revealed no abnormalities. The scope was then withdrawn from the cecum and the procedure  completed.  COMPLICATIONS:  None ENDOSCOPIC IMPRESSION: 1) Normal terminal ileum 2) Five polyps in the ascending and transverse colon.  Removed and sent to pathology. 3) Two polyps in the descending colon. Removed and sent to pathology. 4) Polyps, 9,  in the rectum and distal sigmoid colon.  Removed and sent to pathology.  RECOMMENDATIONS: 1) Avoid all NSAIDS for the next 2 weeks. 2) Await pathology results 3) Timing of repeat colonoscopy will be determined by pathology findings. 4) Can resume plavix in 3 days.  Carie Caddy. Rhea Belton, MD  CC:  The Patient Evelena Peat, MD  n. eSIGNEDCarie Caddy. Saylah Ketner at 02/24/2012 09:53 AM  Brandy Hale, [id number]

## 2012-02-24 NOTE — OR Nursing (Signed)
During colonoscopy procedure, pt received treatment with hot snare and grounding pad was applied to RT posterior thigh. When pad was removed post procedure, skin appeared to be WNL, and warm dry and intact.  Site was reassessed in recovery during repositioning, and a line of slightly red, raised, intact skin was present where back of pad had been, at posterior thigh next to a scar that wife verified was present before procedure.  Dr. Rhea Belton was notified and assessed site, and instructed wife that if it becomes more red, she can put a moisturizing lotion at the area, possibly cortisone if necessary. Wife was shown site, and both wife and patient spoke to RN and MD about site.  Verbalized understanding that area was fine, just to observe the site periodically for increase in redness.  When pt left endoscopy department, site was intact and warm to the touch, slightly red and raised in about a 1.5-2 inch line.  Angelique Blonder, RN

## 2012-02-24 NOTE — Anesthesia Postprocedure Evaluation (Signed)
  Anesthesia Post-op Note  Patient: Austin [name]  Procedure(s) Performed: Procedure(s) (LRB): COLONOSCOPY WITH PROPOFOL (N/A)  Patient Location: PACU  Anesthesia Type: MAC  Level of Consciousness: awake and alert   Airway and Oxygen Therapy: Patient Spontanous Breathing  Post-op Pain: mild  Post-op Assessment: Post-op Vital signs reviewed, Patient's Cardiovascular Status Stable, Respiratory Function Stable, Patent Airway and No signs of Nausea or vomiting  Post-op Vital Signs: stable  Complications: No apparent anesthesia complications

## 2012-02-24 NOTE — Anesthesia Preprocedure Evaluation (Signed)
Anesthesia Evaluation  Patient identified by MRN, date of birth, ID band Patient awake    Reviewed: Allergy & Precautions, H&P , NPO status , Patient's Chart, lab work & pertinent test results  Airway Mallampati: II TM Distance: <3 FB Neck ROM: Full    Dental No notable dental hx.    Pulmonary neg pulmonary ROS,  breath sounds clear to auscultation  Pulmonary exam normal       Cardiovascular hypertension, Pt. on medications Rhythm:Regular Rate:Normal     Neuro/Psych Quad CVA negative neurological ROS  negative psych ROS   GI/Hepatic negative GI ROS, Neg liver ROS,   Endo/Other  negative endocrine ROS  Renal/GU negative Renal ROS  negative genitourinary   Musculoskeletal negative musculoskeletal ROS (+)   Abdominal   Peds negative pediatric ROS (+)  Hematology negative hematology ROS (+)   Anesthesia Other Findings   Reproductive/Obstetrics negative OB ROS                           Anesthesia Physical Anesthesia Plan  ASA: III  Anesthesia Plan: MAC   Post-op Pain Management:    Induction: Intravenous  Airway Management Planned: Nasal Cannula  Additional Equipment:   Intra-op Plan:   Post-operative Plan:   Informed Consent: I have reviewed the patients History and Physical, chart, labs and discussed the procedure including the risks, benefits and alternatives for the proposed anesthesia with the patient or authorized representative who has indicated his/her understanding and acceptance.   Dental advisory given  Plan Discussed with: CRNA  Anesthesia Plan Comments:         Anesthesia Quick Evaluation

## 2012-02-25 ENCOUNTER — Encounter: Payer: Self-pay | Admitting: Internal Medicine

## 2012-02-25 ENCOUNTER — Telehealth: Payer: Self-pay | Admitting: Family Medicine

## 2012-02-25 ENCOUNTER — Other Ambulatory Visit: Payer: Medicare Other

## 2012-02-25 ENCOUNTER — Other Ambulatory Visit: Payer: Self-pay | Admitting: *Deleted

## 2012-02-25 DIAGNOSIS — N319 Neuromuscular dysfunction of bladder, unspecified: Secondary | ICD-10-CM

## 2012-02-25 NOTE — Telephone Encounter (Signed)
Please call and let her know if any meds will be called in

## 2012-02-25 NOTE — Patient Instructions (Signed)
, °

## 2012-02-26 MED ORDER — CIPROFLOXACIN HCL 500 MG PO TABS: 500.0000 mg | ORAL_TABLET | Freq: Two times a day (BID) | ORAL | Status: DC

## 2012-02-26 NOTE — Telephone Encounter (Signed)
May start Cipro 500 mg twice a day for 7 days pending culture results

## 2012-02-26 NOTE — Telephone Encounter (Signed)
Quick Note:  Pt wife did bring another Urine yesterday and it is being cultured ______

## 2012-02-26 NOTE — Telephone Encounter (Signed)
Wife dropped of 2nd UA to be cultured on Wed, 7/17, culture pending

## 2012-02-26 NOTE — Telephone Encounter (Signed)
Wife informed

## 2012-02-27 ENCOUNTER — Inpatient Hospital Stay (HOSPITAL_COMMUNITY): Payer: Medicare Other

## 2012-02-27 ENCOUNTER — Emergency Department (HOSPITAL_COMMUNITY): Payer: Medicare Other

## 2012-02-27 ENCOUNTER — Inpatient Hospital Stay (HOSPITAL_COMMUNITY)
Admission: EM | Admit: 2012-02-27 | Discharge: 2012-02-29 | DRG: 919 | Disposition: A | Discharge status: Home / Self Care | Payer: Medicare Other | Attending: Internal Medicine | Admitting: Internal Medicine

## 2012-02-27 ENCOUNTER — Encounter (HOSPITAL_COMMUNITY)
Admission: EM | Disposition: A | Discharge status: Home / Self Care | Payer: Self-pay | Source: Home / Self Care | Attending: Internal Medicine

## 2012-02-27 ENCOUNTER — Encounter (HOSPITAL_COMMUNITY): Payer: Self-pay

## 2012-02-27 DIAGNOSIS — Z8679 Personal history of other diseases of the circulatory system: Secondary | ICD-10-CM

## 2012-02-27 DIAGNOSIS — N319 Neuromuscular dysfunction of bladder, unspecified: Secondary | ICD-10-CM | POA: Diagnosis present

## 2012-02-27 DIAGNOSIS — D126 Benign neoplasm of colon, unspecified: Secondary | ICD-10-CM | POA: Diagnosis present

## 2012-02-27 DIAGNOSIS — D72829 Elevated white blood cell count, unspecified: Secondary | ICD-10-CM | POA: Diagnosis present

## 2012-02-27 DIAGNOSIS — I69965 Other paralytic syndrome following unspecified cerebrovascular disease, bilateral: Secondary | ICD-10-CM

## 2012-02-27 DIAGNOSIS — K922 Gastrointestinal hemorrhage, unspecified: Secondary | ICD-10-CM

## 2012-02-27 DIAGNOSIS — I10 Essential (primary) hypertension: Secondary | ICD-10-CM | POA: Diagnosis present

## 2012-02-27 DIAGNOSIS — E119 Type 2 diabetes mellitus without complications: Secondary | ICD-10-CM | POA: Diagnosis present

## 2012-02-27 DIAGNOSIS — K625 Hemorrhage of anus and rectum: Secondary | ICD-10-CM | POA: Diagnosis present

## 2012-02-27 DIAGNOSIS — C649 Malignant neoplasm of unspecified kidney, except renal pelvis: Secondary | ICD-10-CM | POA: Diagnosis present

## 2012-02-27 DIAGNOSIS — R109 Unspecified abdominal pain: Secondary | ICD-10-CM | POA: Diagnosis present

## 2012-02-27 DIAGNOSIS — Z86010 Personal history of colonic polyps: Secondary | ICD-10-CM

## 2012-02-27 DIAGNOSIS — B964 Proteus (mirabilis) (morganii) as the cause of diseases classified elsewhere: Secondary | ICD-10-CM | POA: Diagnosis present

## 2012-02-27 DIAGNOSIS — G8250 Quadriplegia, unspecified: Secondary | ICD-10-CM | POA: Diagnosis present

## 2012-02-27 DIAGNOSIS — IMO0002 Reserved for concepts with insufficient information to code with codable children: Principal | ICD-10-CM | POA: Diagnosis present

## 2012-02-27 DIAGNOSIS — N289 Disorder of kidney and ureter, unspecified: Secondary | ICD-10-CM | POA: Diagnosis present

## 2012-02-27 DIAGNOSIS — R000 Tachycardia, unspecified: Secondary | ICD-10-CM | POA: Diagnosis present

## 2012-02-27 DIAGNOSIS — R1084 Generalized abdominal pain: Secondary | ICD-10-CM | POA: Diagnosis present

## 2012-02-27 DIAGNOSIS — R7309 Other abnormal glucose: Secondary | ICD-10-CM

## 2012-02-27 DIAGNOSIS — N390 Urinary tract infection, site not specified: Secondary | ICD-10-CM | POA: Diagnosis present

## 2012-02-27 DIAGNOSIS — D62 Acute posthemorrhagic anemia: Secondary | ICD-10-CM | POA: Diagnosis present

## 2012-02-27 DIAGNOSIS — Y838 Other surgical procedures as the cause of abnormal reaction of the patient, or of later complication, without mention of misadventure at the time of the procedure: Secondary | ICD-10-CM | POA: Diagnosis present

## 2012-02-27 DIAGNOSIS — E291 Testicular hypofunction: Secondary | ICD-10-CM | POA: Diagnosis present

## 2012-02-27 HISTORY — PX: COLONOSCOPY: SHX5424

## 2012-02-27 LAB — TYPE AND SCREEN
ABO/RH(D): O POS
Antibody Screen: NEGATIVE

## 2012-02-27 LAB — CBC WITH DIFFERENTIAL/PLATELET
Basophils Absolute: 0.1 10*3/uL (ref 0.0–0.1)
Basophils Relative: 1 % (ref 0–1)
Basophils Relative: 1 % (ref 0–1)
Eosinophils Absolute: 0.1 10*3/uL (ref 0.0–0.7)
Eosinophils Relative: 1 % (ref 0–5)
Eosinophils Relative: 1 % (ref 0–5)
HCT: 46.3 % (ref 39.0–52.0)
HCT: 44.9 % (ref 39.0–52.0)
Hemoglobin: 15.7 g/dL (ref 13.0–17.0)
Lymphocytes Relative: 10 % — ABNORMAL LOW (ref 12–46)
Lymphs Abs: 1.3 10*3/uL (ref 0.7–4.0)
MCH: 29.7 pg (ref 26.0–34.0)
MCHC: 33.9 g/dL (ref 30.0–36.0)
MCHC: 33.4 g/dL (ref 30.0–36.0)
MCV: 87.7 fL (ref 78.0–100.0)
MCV: 87.5 fL (ref 78.0–100.0)
Monocytes Absolute: 1.8 10*3/uL — ABNORMAL HIGH (ref 0.1–1.0)
Monocytes Absolute: 1.8 10*3/uL — ABNORMAL HIGH (ref 0.1–1.0)
Monocytes Relative: 11 % (ref 3–12)
Neutro Abs: 13.9 10*3/uL — ABNORMAL HIGH (ref 1.7–7.7)
Neutrophils Relative %: 81 % — ABNORMAL HIGH (ref 43–77)
Platelets: 283 10*3/uL (ref 150–400)
RBC: 5.28 MIL/uL (ref 4.22–5.81)
RDW: 16.3 % — ABNORMAL HIGH (ref 11.5–15.5)
WBC: 17.8 10*3/uL — ABNORMAL HIGH (ref 4.0–10.5)

## 2012-02-27 LAB — COMPREHENSIVE METABOLIC PANEL
Albumin: 3 g/dL — ABNORMAL LOW (ref 3.5–5.2)
Alkaline Phosphatase: 80 U/L (ref 39–117)
BUN: 11 mg/dL (ref 6–23)
Calcium: 9.2 mg/dL (ref 8.4–10.5)
Creatinine, Ser: 0.61 mg/dL (ref 0.50–1.35)
GFR calc Af Amer: >90 mL/min (ref >90)
Glucose, Bld: 162 mg/dL — ABNORMAL HIGH (ref 70–99)
Total Protein: 7.5 g/dL (ref 6.0–8.3)

## 2012-02-27 LAB — PROTIME-INR: Prothrombin Time: 13.6 seconds (ref 11.6–15.2)

## 2012-02-27 LAB — MRSA PCR SCREENING: MRSA by PCR: NEGATIVE

## 2012-02-27 LAB — TROPONIN I: Troponin I: <0.3 ng/mL (ref <0.30)

## 2012-02-27 SURGERY — COLONOSCOPY
Anesthesia: Moderate Sedation

## 2012-02-27 MED ORDER — SODIUM CHLORIDE 0.9 % IJ SOLN
3.0000 mL | Freq: Two times a day (BID) | INTRAMUSCULAR | Status: DC
  Administered 2012-02-28 – 2012-02-29 (×2): 3 mL via INTRAVENOUS

## 2012-02-27 MED ORDER — PANTOPRAZOLE SODIUM 40 MG IV SOLR
40.0000 mg | Freq: Two times a day (BID) | INTRAVENOUS | Status: DC
  Administered 2012-02-28: 40 mg via INTRAVENOUS
  Filled 2012-02-27 (×3): qty 40

## 2012-02-27 MED ORDER — INSULIN ASPART 100 UNIT/ML ~~LOC~~ SOLN: 0.0000 [IU] | Freq: Three times a day (TID) | SUBCUTANEOUS | Status: DC

## 2012-02-27 MED ORDER — OXYBUTYNIN CHLORIDE 5 MG PO TABS
5.0000 mg | ORAL_TABLET | Freq: Two times a day (BID) | ORAL | Status: DC
Start: 2012-02-27 — End: 2012-02-29
  Administered 2012-02-28 – 2012-02-29 (×3): 5 mg via ORAL
  Filled 2012-02-27 (×5): qty 1

## 2012-02-27 MED ORDER — TRAZODONE HCL 100 MG PO TABS
100.0000 mg | ORAL_TABLET | Freq: Every day | ORAL | Status: DC
  Filled 2012-02-27 (×2): qty 1

## 2012-02-27 MED ORDER — LISINOPRIL-HYDROCHLOROTHIAZIDE 10-12.5 MG PO TABS: 1.0000 | ORAL_TABLET | Freq: Every day | ORAL | Status: DC

## 2012-02-27 MED ORDER — MIDAZOLAM HCL 5 MG/5ML IJ SOLN
INTRAMUSCULAR | Status: DC | PRN
  Administered 2012-02-27: 2 mg via INTRAVENOUS
  Administered 2012-02-27: 1 mg via INTRAVENOUS
  Administered 2012-02-27: 2 mg via INTRAVENOUS

## 2012-02-27 MED ORDER — SODIUM CHLORIDE 0.9 % IV BOLUS (SEPSIS)
500.0000 mL | Freq: Once | INTRAVENOUS | Status: AC
  Administered 2012-02-27: 500 mL via INTRAVENOUS

## 2012-02-27 MED ORDER — INSULIN ASPART 100 UNIT/ML ~~LOC~~ SOLN
0.0000 [IU] | SUBCUTANEOUS | Status: DC
  Administered 2012-02-28: 2 [IU] via SUBCUTANEOUS
  Administered 2012-02-28: 1 [IU] via SUBCUTANEOUS
  Administered 2012-02-28: 3 [IU] via SUBCUTANEOUS

## 2012-02-27 MED ORDER — LISINOPRIL 10 MG PO TABS
10.0000 mg | ORAL_TABLET | Freq: Every day | ORAL | Status: DC
  Administered 2012-02-28 – 2012-02-29 (×2): 10 mg via ORAL
  Filled 2012-02-27 (×2): qty 1

## 2012-02-27 MED ORDER — FENTANYL CITRATE 0.05 MG/ML IJ SOLN
INTRAMUSCULAR | Status: DC | PRN
  Administered 2012-02-27 (×2): 25 ug via INTRAVENOUS

## 2012-02-27 MED ORDER — CIPROFLOXACIN IN D5W 400 MG/200ML IV SOLN
400.0000 mg | Freq: Once | INTRAVENOUS | Status: AC
  Administered 2012-02-28: 400 mg via INTRAVENOUS
  Filled 2012-02-27: qty 200

## 2012-02-27 MED ORDER — FENTANYL CITRATE 0.05 MG/ML IJ SOLN
INTRAMUSCULAR | Status: AC
  Filled 2012-02-27: qty 4

## 2012-02-27 MED ORDER — ACETAMINOPHEN 325 MG PO TABS: 650.0000 mg | ORAL_TABLET | Freq: Four times a day (QID) | ORAL | Status: DC | PRN

## 2012-02-27 MED ORDER — CIPROFLOXACIN HCL 500 MG PO TABS
500.0000 mg | ORAL_TABLET | Freq: Two times a day (BID) | ORAL | Status: DC
  Administered 2012-02-28 – 2012-02-29 (×3): 500 mg via ORAL
  Filled 2012-02-27 (×5): qty 1

## 2012-02-27 MED ORDER — HYDROCHLOROTHIAZIDE 12.5 MG PO CAPS
12.5000 mg | ORAL_CAPSULE | Freq: Every day | ORAL | Status: DC
  Administered 2012-02-28 – 2012-02-29 (×2): 12.5 mg via ORAL
  Filled 2012-02-27 (×2): qty 1

## 2012-02-27 MED ORDER — SODIUM CHLORIDE 0.9 % IV SOLN
INTRAVENOUS | Status: DC
  Administered 2012-02-27 – 2012-02-28 (×2): 75 mL/h via INTRAVENOUS

## 2012-02-27 MED ORDER — SODIUM CHLORIDE 0.9 % IJ SOLN
INTRAMUSCULAR | Status: DC | PRN
  Administered 2012-02-27: 23:00:00

## 2012-02-27 MED ORDER — ACETAMINOPHEN 650 MG RE SUPP: 650.0000 mg | Freq: Four times a day (QID) | RECTAL | Status: DC | PRN

## 2012-02-27 MED ORDER — MIDAZOLAM HCL 10 MG/2ML IJ SOLN
INTRAMUSCULAR | Status: AC
  Filled 2012-02-27: qty 4

## 2012-02-27 MED ORDER — ATORVASTATIN CALCIUM 20 MG PO TABS
20.0000 mg | ORAL_TABLET | Freq: Every day | ORAL | Status: DC
  Administered 2012-02-28 – 2012-02-29 (×2): 20 mg via ORAL
  Filled 2012-02-27 (×3): qty 1

## 2012-02-27 NOTE — ED Provider Notes (Signed)
History     CSN: [id number]  Arrival date & time 02/27/12  1626   First MD Initiated Contact with Patient 02/27/12 1701      Chief Complaint  Patient presents with  . Rectal Bleeding    (Consider location/radiation/quality/duration/timing/severity/associated sxs/prior treatment) HPI Comments: Pt is status post colonoscopy by Dr. Sharla Kidney last week for polyp removal. He's been doing well since then and today he was at home and noticed a large bloody bowel movement. 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 He's been complaining of some mild lower abdominal pain it started after arrival to emergency department. She got dizzy and diaphoretic when the incident happened but denies any right now. Denies any further episodes of bleeding. His wife says that he had the urge to have a bowel movement and when she looked she had a large bloody bowel movement all around his buttocks and down his legs.  Patient is a 56 y.o. Austin [name] presenting with hematochezia. The history is provided by the patient.  Rectal Bleeding  The current episode started today. The problem has been unchanged. Associated symptoms include abdominal pain. Pertinent negatives include no fever, no diarrhea, no nausea, no vomiting, no hematuria, no chest pain, no headaches, no coughing and no rash.    Past Medical History  Diagnosis Date  . Hx of colonic polyps   . Hypertension   . Pituitary adenoma   . Urinary incontinence   . Venous stasis     edema  . 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 Hypogonadism   . Cerebrovascular accident 1994    hx of brainstem stroke, residual diminished lung capacity  . Neuromuscular disorder     quadraplegic    Past Surgical History  Procedure Date  . Tracheostomy tube placement 02/1993    removed 04/1993  . Stomach peg 02/1993    removed 5-6 years later  . Cholecystectomy   . Pituitary surgery  2007, 2011  . Gamma knife radiation surgery 05/2010    for pituitary  . Vocal cord surgery     injected with collagen, then fat from stomach to improve speech s/p stroke    Family History  Problem Relation Age of Onset  . Alcohol abuse Father   . Arthritis Mother   . Colon cancer Brother   . Arthritis Maternal Grandmother   . Hyperlipidemia Mother   . Hypertension Brother   . Stroke Paternal Grandmother   . Throat cancer Father   . Esophageal cancer Father 4  . Alcohol abuse Father   . Uterine cancer Mother 48    History  Substance Use Topics  . Smoking status: Former Smoker -- 0.5 packs/day for 22 years    Types: Cigarettes    Quit date: 09/24/1992  . Smokeless tobacco: Not on file  . Alcohol Use: 0.0 oz/week     rarley      Review of Systems  Constitutional: Positive for diaphoresis. Negative for fever, chills and fatigue.  HENT: Negative for congestion, rhinorrhea and sneezing.   Eyes: Negative.   Respiratory: Negative for cough, chest tightness and shortness of breath.   Cardiovascular: Negative for chest pain and leg swelling.  Gastrointestinal: Positive for abdominal pain, blood in stool and hematochezia. Negative for nausea, vomiting and diarrhea.  Genitourinary: Negative for frequency, hematuria, flank pain and difficulty urinating.  Musculoskeletal: Negative for back pain and arthralgias.  Skin: Negative for  rash.  Neurological: Positive for dizziness. Negative for speech difficulty, weakness, numbness and headaches.    Allergies  Penicillins  Home Medications   No current outpatient prescriptions on file.  BP 150/90  Pulse 94  Temp 98.6 F (37 C) (Oral)  Resp 14  SpO2 100%  Physical Exam  Constitutional: He is oriented to person, place, and time. He appears well-developed and well-nourished.  HENT:  Head: Normocephalic and atraumatic.  Mouth/Throat: Oropharynx is clear and moist.  Eyes: Pupils are equal, round, and reactive to light.    Neck: Normal range of motion. Neck supple.  Cardiovascular: Normal rate, regular rhythm and normal heart sounds.   Pulmonary/Chest: Effort normal and breath sounds normal. No respiratory distress. He has no wheezes. He has no rales. He exhibits no tenderness.  Abdominal: Soft. Bowel sounds are normal. There is tenderness (very mild tenderness to lower abdomen). There is no rebound and no guarding.       Dark dried blood on buttocks and thighs  Musculoskeletal: Normal range of motion. He exhibits no edema.  Lymphadenopathy:    He has no cervical adenopathy.  Neurological: He is alert and oriented to person, place, and time.       Contractures of extremities  Skin: Skin is warm and dry. No rash noted.  Psychiatric: He has a normal mood and affect.    ED Course  Procedures (including critical care time)  Results for orders placed during the hospital encounter of 02/27/12  CBC WITH DIFFERENTIAL      Component Value Range   WBC 16.8 (*) 4.0 - 10.5 K/uL   RBC 5.28  4.22 - 5.81 MIL/uL   Hemoglobin 15.7  13.0 - 17.0 g/dL   HCT 16.1  09.6 - 04.5 %   MCV 87.7  Austin.0 - 100.0 fL   MCH 29.7  26.0 - 34.0 pg   MCHC 33.9  30.0 - 36.0 g/dL   RDW 40.9 (*) 81.1 - 91.4 %   Platelets 249  150 - 400 K/uL   Neutrophils Relative 81 (*) 43 - 77 %   Neutro Abs 13.5 (*) 1.7 - 7.7 K/uL   Lymphocytes Relative 8 (*) 12 - 46 %   Lymphs Abs 1.3  0.7 - 4.0 K/uL   Monocytes Relative 11  3 - 12 %   Monocytes Absolute 1.8 (*) 0.1 - 1.0 K/uL   Eosinophils Relative 1  0 - 5 %   Eosinophils Absolute 0.1  0.0 - 0.7 K/uL   Basophils Relative 1  0 - 1 %   Basophils Absolute 0.1  0.0 - 0.1 K/uL  COMPREHENSIVE METABOLIC PANEL      Component Value Range   Sodium 135  135 - 145 mEq/L   Potassium 3.8  3.5 - 5.1 mEq/L   Chloride 99  96 - 112 mEq/L   CO2 23  19 - 32 mEq/L   Glucose, Bld 162 (*) 70 - 99 mg/dL   BUN 11  6 - 23 mg/dL   Creatinine, Ser 7.82  0.50 - 1.35 mg/dL   Calcium 9.2  8.4 - 95.6 mg/dL   Total  Protein 7.5  6.0 - 8.3 g/dL   Albumin 3.0 (*) 3.5 - 5.2 g/dL   AST 16  0 - 37 U/L   ALT 24  0 - 53 U/L   Alkaline Phosphatase 80  39 - 117 U/L   Total Bilirubin 0.4  0.3 - 1.2 mg/dL   GFR calc non Af Amer >90  >  90 mL/min   GFR calc Af Amer >90  >90 mL/min  TYPE AND SCREEN      Component Value Range   ABO/RH(D) O POS     Antibody Screen NEG     Sample Expiration 03/01/2012    TROPONIN I      Component Value Range   Troponin I <0.30  <0.30 ng/mL  CBC WITH DIFFERENTIAL      Component Value Range   WBC 17.8 (*) 4.0 - 10.5 K/uL   RBC 5.13  4.22 - 5.81 MIL/uL   Hemoglobin 15.0  13.0 - 17.0 g/dL   HCT 16.1  09.6 - 04.5 %   MCV 87.5  Austin.0 - 100.0 fL   MCH 29.2  26.0 - 34.0 pg   MCHC 33.4  30.0 - 36.0 g/dL   RDW 40.9 (*) 81.1 - 91.4 %   Platelets 283  150 - 400 K/uL   Neutrophils Relative 79 (*) 43 - 77 %   Neutro Abs 13.9 (*) 1.7 - 7.7 K/uL   Lymphocytes Relative 10 (*) 12 - 46 %   Lymphs Abs 1.8  0.7 - 4.0 K/uL   Monocytes Relative 10  3 - 12 %   Monocytes Absolute 1.8 (*) 0.1 - 1.0 K/uL   Eosinophils Relative 1  0 - 5 %   Eosinophils Absolute 0.1  0.0 - 0.7 K/uL   Basophils Relative 1  0 - 1 %   Basophils Absolute 0.1  0.0 - 0.1 K/uL  APTT      Component Value Range   aPTT 34  24 - 37 seconds  PROTIME-INR      Component Value Range   Prothrombin Time 13.6  11.6 - 15.2 seconds   INR 1.02  0.00 - 1.49  ABO/RH      Component Value Range   ABO/RH(D) O POS    MRSA PCR SCREENING      Component Value Range   MRSA by PCR NEGATIVE  NEGATIVE   Dg Abd 1 View  02/27/2012  *RADIOLOGY REPORT*  Clinical Data: Rectal bleeding.  Multiple colon polyps removed on 02/24/2012.  ABDOMEN - 1 VIEW  Comparison: None.  Findings: There are no dilated loops of large or small bowel. There is air scattered throughout the nondistended colon. Phleboliths in the pelvis.  Evidence of prior cholecystectomy.  IMPRESSION: Benign-appearing abdomen.  Original Report Authenticated By: Gwynn Burly, M.D.     Date: 02/27/2012  Rate: 103  Rhythm: sinus tachycardia  QRS Axis: normal  Intervals: normal  ST/T Wave abnormalities: nonspecific ST/T changes  Conduction Disutrbances:none  Narrative Interpretation:   Old EKG Reviewed: none available     1. Rectal bleeding   2. Abdominal  pain, other specified site   3. Other abnormal glucose   4. Personal history of colonic polyps   5. Post-polypectomy bleeding       MDM  Pt with lower GI bleed.  hgb okay now.  Spoke with Dr. Rhea Belton with GI who will see pt.  Will consult hospitalist for admission.  Pt has been mildly tachycardic, BP stable.  Has had 2 further episodes of bleeding in ED.  Type and screen done.        Rolan Bucco, MD 02/28/12 [phone]

## 2012-02-27 NOTE — ED Notes (Addendum)
Pt from home, lives w/wife.  Hx: quadrepelegia d/t stroke in 1994.  Rectal bleeding began around 2 hrs ago.  Pt had colonoscopy by Dr. Elnoria Howard this past Tues w/ 15 polyps removed but otherwise no complications reported.  No current bleeding per EMS.  Pt came w/foley cath in place.  Pt is also currently being treated for UTI, having taken 1st cipro today.

## 2012-02-27 NOTE — ED Notes (Signed)
ZOX:[id number]<AV> Expected date:<BR> Expected time:<BR> Means of arrival:<BR> Comments:<BR> GI bleed

## 2012-02-27 NOTE — Op Note (Signed)
[facility] [address] [place], [place]  [address]  COLONOSCOPY PROCEDURE REPORT  PATIENT:  Austin [name], Austin [name]  MR#:  [id number] BIRTHDATE:  1956-01-06, 56 yrs. old  GENDER:  male ENDOSCOPIST:  Carie Caddy. Caci Orren, MD  PROCEDURE DATE:  02/27/2012 PROCEDURE:  Colon w/ endoscopic clipping, Colonoscopy with submucosal injection, Colonoscopy for control of bleeding ASA CLASS:  Class III INDICATIONS:  hematochezia,post-polypectomy bleeding MEDICATIONS:   Fentanyl 50 mcg IV, Versed 5 mg IV, Epinephrine 4 cc  DESCRIPTION OF PROCEDURE:   After the risks benefits and alternatives of the procedure were thoroughly explained, informed consent was obtained.  Digital rectal exam was performed and revealed blood.   The [id number]) endoscope was introduced through the anus and advanced to the ascending colon, without limitations.  The quality of the prep was none.  The instrument was then slowly withdrawn as the colon was fully examined. <<PROCEDUREIMAGES>>  FINDINGS:  Blood and clots was found ascending colon to rectum. There was solid, formed brown stool in the proximal ascending colon.   An ulcer, consistent with post-polypectomy ulceration with adherent clot was found in the descending colon. Submucosal injection was performed with 4 cc of epinephrine 1:10,000 for hemostasis. The adherent clot was then removed. There was no active bleeding after injection. 4 Endoscopic clips were placed across the ulcer with good result.  Another ulcer was found in the recto-sigmoid colon with visible vessel. There was no active bleeding or adherent clot. One endoscopic clip was placed with success.  There were additional clean-based and small post-polypectomy ulcerations in the rectosigmoid colon. Retroflexion was not performed. The scope was then withdrawn from the right colon and the procedure completed.  COMPLICATIONS:  None ENDOSCOPIC IMPRESSION: 1) Blood  and clots ascending colon to  rectum. Copious irrigation and lavage performed 2) Post polypectomy ulcer with adherent clot In the descending colon.Treated successfully with epinephrine injection and hemostatic clip placement 3) Post-polypectomy ulcer in the recto-sigmoid colon with visible vessel. Treated successfully with hemostatic clip placement . 4) Several small post-polypectomy ulcerations seen in rectum and rectosigmoid. These ulcerations were clean based without active bleeding.  RECOMMENDATIONS: 1) Cycle hemoglobin and hematocrit 2) Continue to hold aspirin and clopidogrel 3) clear liquid diet 4) If evidence of active rebleeding, would repeat colonoscopy 5) Supportive care  [name], MD  CC:  The Patient  n. eSIGNED:   Carie Caddy. Jaramie Bastos at 02/27/2012 11:34 PM  Brandy Hale, [phone]

## 2012-02-27 NOTE — H&P (Signed)
Austin [name] is an 56 y.o. male.   Austin [name]  Chief Complaint: rectal bleeding HPI: 56 yo male with quadriplegia, who recently had colonscopy this past Tuesday=>15 polyps removed per pt,  apparently was at home this afternoon when his wife notice some rectal bleeding about 3pm.  She called 911 and brought the patient to the hospital.  In the ER pt continued to have some rectal bleeding.  Pt has c/o abdominal pain RLQ, sharp pain, intermittent.  Has slight nausea.  Denies emesis, constipation, black stool, fever, chills.   Past Medical History  Diagnosis Date  . Hx of colonic polyps   . Hypertension   . Pituitary adenoma   . Urinary incontinence   . Venous stasis     edema  . 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 Hypogonadism   . Cerebrovascular accident 1994    hx of brainstem stroke, residual diminished lung capacity  . Neuromuscular disorder     quadraplegic    Past Surgical History  Procedure Date  . Tracheostomy tube placement 02/1993    removed 04/1993  . Stomach peg 02/1993    removed 5-6 years later  . Cholecystectomy   . Pituitary surgery 2007, 2011  . Gamma knife radiation surgery 05/2010    for pituitary  . Vocal cord surgery     injected with collagen, then fat from stomach to improve speech s/p stroke    Family History  Problem Relation Age of Onset  . Alcohol abuse Father   . Arthritis Mother   . Colon cancer Brother   . Arthritis Maternal Grandmother   . Hyperlipidemia Mother   . Hypertension Brother   . Stroke Paternal Grandmother   . Throat cancer Father   . Esophageal cancer Father 81  . Alcohol abuse Father   . Uterine cancer Mother 17   Social History:  reports that he quit smoking about 19 years ago. His smoking use included Cigarettes. He has a 11 pack-year smoking history. He does not have any smokeless tobacco history on file. He reports that he drinks alcohol. His drug history not on  file.  Allergies:  Allergies  Allergen Reactions  . Penicillins     REACTION: rash     (Not in a hospital admission)  Results for orders placed during the hospital encounter of 02/27/12 (from the past 48 hour(s))  CBC WITH DIFFERENTIAL     Status: Abnormal   Collection Time   02/27/12  5:55 PM      Component Value Range Comment   WBC 16.8 (*) 4.0 - 10.5 K/uL    RBC 5.28  4.22 - 5.81 MIL/uL    Hemoglobin 15.7  13.0 - 17.0 g/dL    HCT 16.1  09.6 - 04.5 %    MCV 87.7  78.0 - 100.0 fL    MCH 29.7  26.0 - 34.0 pg    MCHC 33.9  30.0 - 36.0 g/dL    RDW 40.9 (*) 81.1 - 15.5 %    Platelets 249  150 - 400 K/uL    Neutrophils Relative 81 (*) 43 - 77 %    Neutro Abs 13.5 (*) 1.7 - 7.7 K/uL    Lymphocytes Relative 8 (*) 12 - 46 %    Lymphs Abs 1.3  0.7 - 4.0 K/uL    Monocytes Relative 11  3 - 12 %    Monocytes Absolute 1.8 (*) 0.1 - 1.0 K/uL    Eosinophils  Relative 1  0 - 5 %    Eosinophils Absolute 0.1  0.0 - 0.7 K/uL    Basophils Relative 1  0 - 1 %    Basophils Absolute 0.1  0.0 - 0.1 K/uL   COMPREHENSIVE METABOLIC PANEL     Status: Abnormal   Collection Time   02/27/12  5:55 PM      Component Value Range Comment   Sodium 135  135 - 145 mEq/L    Potassium 3.8  3.5 - 5.1 mEq/L    Chloride 99  96 - 112 mEq/L    CO2 23  19 - 32 mEq/L    Glucose, Bld 162 (*) 70 - 99 mg/dL    BUN 11  6 - 23 mg/dL    Creatinine, Ser 1.61  0.50 - 1.35 mg/dL    Calcium 9.2  8.4 - 09.6 mg/dL    Total Protein 7.5  6.0 - 8.3 g/dL    Albumin 3.0 (*) 3.5 - 5.2 g/dL    AST 16  0 - 37 U/L    ALT 24  0 - 53 U/L    Alkaline Phosphatase 80  39 - 117 U/L    Total Bilirubin 0.4  0.3 - 1.2 mg/dL    GFR calc non Af Amer >90  >90 mL/min    GFR calc Af Amer >90  >90 mL/min   TROPONIN I     Status: Normal   Collection Time   02/27/12  5:55 PM      Component Value Range Comment   Troponin I <0.30  <0.30 ng/mL   TYPE AND SCREEN     Status: Normal (Preliminary result)   Collection Time   02/27/12  6:45 PM       Component Value Range Comment   ABO/RH(D) O POS      Antibody Screen PENDING      Sample Expiration 03/01/2012      Dg Abd 1 View  02/27/2012  *RADIOLOGY REPORT*  Clinical Data: Rectal bleeding.  Multiple colon polyps removed on 02/24/2012.  ABDOMEN - 1 VIEW  Comparison: None.  Findings: There are no dilated loops of large or small bowel. There is air scattered throughout the nondistended colon. Phleboliths in the pelvis.  Evidence of prior cholecystectomy.  IMPRESSION: Benign-appearing abdomen.  Original Report Authenticated By: Gwynn Burly, M.D.   Dg Abd Decub  02/27/2012  *RADIOLOGY REPORT*  Clinical Data: Rectal bleeding.  Evaluate for free air.  ABDOMEN - 1 VIEW DECUBITUS  Comparison: Abdominal radiographs 02/27/2012.  Findings: Left lateral decubitus view of the abdomen demonstrates some gas and stool in the ascending colon.  There are some small air fluid levels in the central abdomen, likely within the small bowel.  These loops of small bowel do not appear to be dilated.  No gross evidence of pneumoperitoneum is identified.  IMPRESSION: 1.  No evidence of pneumoperitoneum.  Original Report Authenticated By: Florencia Reasons, M.D.    Review of Systems  Constitutional: Negative for fever, chills, weight loss, malaise/fatigue and diaphoresis.  HENT: Negative for hearing loss, ear pain, nosebleeds, congestion, sore throat, neck pain, tinnitus and ear discharge.   Eyes: Negative for blurred vision, double vision, photophobia, pain, discharge and redness.  Respiratory: Negative for cough, hemoptysis, sputum production, shortness of breath, wheezing and stridor.   Cardiovascular: Negative for chest pain, palpitations, orthopnea, claudication, leg swelling and PND.  Gastrointestinal: Positive for nausea, abdominal pain, blood in stool and melena. Negative for heartburn, diarrhea  and constipation.  Genitourinary: Negative for dysuria, urgency, frequency, hematuria and flank pain.    Musculoskeletal: Negative for myalgias, back pain, joint pain and falls.  Skin: Negative for itching and rash.  Neurological: Negative for dizziness, tingling, tremors, sensory change, speech change, focal weakness, seizures, loss of consciousness, weakness and headaches.  Endo/Heme/Allergies: Negative for environmental allergies and polydipsia. Does not bruise/bleed easily.  Psychiatric/Behavioral: Negative for depression, suicidal ideas, hallucinations, memory loss and substance abuse. The patient is not nervous/anxious and does not have insomnia.     Blood pressure 129/82, pulse 99, temperature 98 F (36.7 C), temperature source Oral, resp. rate 18, SpO2 97.00%. Physical Exam  Constitutional: He is oriented to person, place, and time. He appears well-developed and well-nourished. No distress.  HENT:  Head: Normocephalic.  Mouth/Throat: No oropharyngeal exudate.  Eyes: Pupils are equal, round, and reactive to light.       Pink conjunctiva  Neck: Normal range of motion. Neck supple. No JVD present. No tracheal deviation present. No thyromegaly present.  Cardiovascular: Normal rate, regular rhythm and normal heart sounds.  Exam reveals no gallop and no friction rub.   No murmur heard. Respiratory: No stridor. No respiratory distress. He has no wheezes. He has no rales. He exhibits no tenderness.  GI: Soft. Bowel sounds are normal. He exhibits no distension and no mass. There is no tenderness. There is no rebound and no guarding.  Musculoskeletal: Normal range of motion. He exhibits no edema and no tenderness.  Lymphadenopathy:    He has no cervical adenopathy.  Neurological: He is alert and oriented to person, place, and time.  Skin: Skin is warm and dry. No rash noted. He is not diaphoretic. No erythema. No pallor.  Psychiatric: He has a normal mood and affect. His behavior is normal. Judgment and thought content normal.     Assessment/Plan Rectal bleeding: npo, protonix 40mg  iv bid,   gi consult,  Type and screen, cbc q6h  Abdominal pain:  CT scan abd/pelvis Colonic polyps Glucose intolerance: fsbs q4h Tachycardia: tele, check cardiac marker CVA:  Hold off on any asa, plavix.  Functional Quadriplegia: stable   Pearson Grippe 02/27/2012, 8:18 PM

## 2012-02-27 NOTE — ED Notes (Addendum)
Patient transported to XR. 

## 2012-02-27 NOTE — Consult Note (Signed)
Gastroenterology Consultation  Referring Provider: ED MD, Dr. Fredderick Phenix Primary Care Physician:  Kristian Covey, MD Primary Gastroenterologist:  Dr. Rhea Belton  Reason for Consultation:  Hematochezia, recent colonoscopy with polypectomy  HPI: Austin [name] is a 56 y.o. male with a past medical history of colon polyps status post polypectomy 3 days ago, history of brainstem stroke leaving him quadriplegic, hypogonadism, hypertension who presented to the emergency department this evening after developing hematochezia.  He developed the urge to while out with his wife this pm.  She then noted red blood in his adult diaper.  He had another episode of hematochezia at home and felt a bit light-headed, and she called 911.  He was transported to the ED via EMS and had 2 additional episodes of rectal bleeding in the ED.  Initial Hgb was stable.  VSS.  He has noted lower abd, suprapub cramping worse before defecation.  Relieved by defecation.   Past Medical History  Diagnosis Date  . Hx of colonic polyps   . Hypertension   . Pituitary adenoma   . Urinary incontinence   . Venous stasis     edema  . 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 Hypogonadism   . Cerebrovascular accident 1994    hx of brainstem stroke, residual diminished lung capacity  . Neuromuscular disorder     quadraplegic    Past Surgical History  Procedure Date  . Tracheostomy tube placement 02/1993    removed 04/1993  . Stomach peg 02/1993    removed 5-6 years later  . Cholecystectomy   . Pituitary surgery 2007, 2011  . Gamma knife radiation surgery 05/2010    for pituitary  . Vocal cord surgery     injected with collagen, then fat from stomach to improve speech s/p stroke    Prior to Admission medications   Medication Sig Start Date End Date Taking? Authorizing Provider  aspirin EC 325 MG tablet Take 325 mg by mouth daily.   Yes Historical Provider, MD  atorvastatin (LIPITOR) 20 MG  tablet Take 1 tablet (20 mg total) by mouth daily. 02/19/12 02/18/13 Yes Kristian Covey, MD  ciprofloxacin (CIPRO) 500 MG tablet Take 1 tablet (500 mg total) by mouth 2 (two) times daily. 02/26/12  Yes Kristian Covey, MD  clopidogrel (PLAVIX) 75 MG tablet Take 1 tablet (75 mg total) by mouth daily. 02/19/12  Yes Kristian Covey, MD  Cranberry Fruit Concentrate 12600 MG CAPS Take 1 capsule by mouth daily.   Yes Historical Provider, MD  escitalopram (LEXAPRO) 10 MG tablet Take 1 tablet (10 mg total) by mouth daily. 02/19/12  Yes Kristian Covey, MD  furosemide (LASIX) 40 MG tablet Take 40 mg by mouth daily as needed. fluid    Yes Historical Provider, MD  ketoconazole (NIZORAL) 2 % cream APPLY  TO AFFECTED AREA TWICE DAILY AS NEEDED 01/08/12  Yes Kristian Covey, MD  lisinopril-hydrochlorothiazide (PRINZIDE,ZESTORETIC) 10-12.5 MG per tablet Take 1 tablet by mouth daily. 02/19/12  Yes Kristian Covey, MD  Multiple Vitamins-Minerals (CENTRUM SILVER ULTRA MENS) TABS Take by mouth daily.     Yes Historical Provider, MD  NEEDLE, DISP, 18 G (B-D BLUNT FILL NEEDLE) 18G X 1-1/2" MISC Inject Testosterone IM as directed every 14 days 11/25/11  Yes Kristian Covey, MD  NEEDLE, DISP, 22 G (B-D HYPODERMIC NEEDLE 22GX1.5") 22G X 1-1/2" MISC Inject testosterone IM every 14 days as directed 11/25/11  Yes Kristian Covey, MD  nitrofurantoin (MACRODANTIN) 100 MG capsule Take 1 capsule (100 mg total) by mouth daily. 02/19/12  Yes Kristian Covey, MD  Omega-3 Fatty Acids (FISH OIL) 1000 MG CAPS Take 1 capsule by mouth 2 (two) times daily.    Yes Historical Provider, MD  oxybutynin (DITROPAN) 5 MG tablet Take 1 tablet (5 mg total) by mouth 2 (two) times daily. 02/19/12  Yes Kristian Covey, MD  potassium chloride SA (K-DUR,KLOR-CON) 20 MEQ tablet Take 20 mEq by mouth daily as needed.   Yes Historical Provider, MD  Syringe, Disposable, (B-D SYRINGE LUER-LOK 3CC) 3 ML MISC Inject 1 ml Testosterone every 14 days  12/09/11  Yes Kristian Covey, MD  testosterone cypionate (DEPO-TESTOSTERONE) 200 MG/ML injection Inject 1 mL (200 mg total) into the muscle every 14 (fourteen) days. 01/20/12  Yes Kristian Covey, MD  traZODone (DESYREL) 100 MG tablet Take 1 tablet (100 mg total) by mouth daily. 02/19/12  Yes Kristian Covey, MD  triamcinolone cream (KENALOG) 0.1 % Apply 1 application topically 2 (two) times daily. Applies to face   Yes Historical Provider, MD    Current Facility-Administered Medications  Medication Dose Route Frequency Provider Last Rate Last Dose  . pantoprazole (PROTONIX) injection 40 mg  40 mg Intravenous Q12H Massie Maroon, MD      . sodium chloride 0.9 % bolus 500 mL  500 mL Intravenous Once Rolan Bucco, MD   500 mL at 02/27/12 2001   Current Outpatient Prescriptions  Medication Sig Dispense Refill  . aspirin EC 325 MG tablet Take 325 mg by mouth daily.      Marland Kitchen atorvastatin (LIPITOR) 20 MG tablet Take 1 tablet (20 mg total) by mouth daily.  90 tablet  3  . ciprofloxacin (CIPRO) 500 MG tablet Take 1 tablet (500 mg total) by mouth 2 (two) times daily.  14 tablet  0  . clopidogrel (PLAVIX) 75 MG tablet Take 1 tablet (75 mg total) by mouth daily.  90 tablet  3  . Cranberry Fruit Concentrate 12600 MG CAPS Take 1 capsule by mouth daily.      Marland Kitchen escitalopram (LEXAPRO) 10 MG tablet Take 1 tablet (10 mg total) by mouth daily.  90 tablet  3  . furosemide (LASIX) 40 MG tablet Take 40 mg by mouth daily as needed. fluid       . ketoconazole (NIZORAL) 2 % cream APPLY  TO AFFECTED AREA TWICE DAILY AS NEEDED  60 g  0  . lisinopril-hydrochlorothiazide (PRINZIDE,ZESTORETIC) 10-12.5 MG per tablet Take 1 tablet by mouth daily.  90 tablet  3  . Multiple Vitamins-Minerals (CENTRUM SILVER ULTRA MENS) TABS Take by mouth daily.        Marland Kitchen NEEDLE, DISP, 18 G (B-D BLUNT FILL NEEDLE) 18G X 1-1/2" MISC Inject Testosterone IM as directed every 14 days  100 each  3  . NEEDLE, DISP, 22 G (B-D HYPODERMIC NEEDLE  22GX1.5") 22G X 1-1/2" MISC Inject testosterone IM every 14 days as directed  100 each  3  . nitrofurantoin (MACRODANTIN) 100 MG capsule Take 1 capsule (100 mg total) by mouth daily.  90 capsule  3  . Omega-3 Fatty Acids (FISH OIL) 1000 MG CAPS Take 1 capsule by mouth 2 (two) times daily.       Marland Kitchen oxybutynin (DITROPAN) 5 MG tablet Take 1 tablet (5 mg total) by mouth 2 (two) times daily.  180 tablet  3  . potassium chloride SA (K-DUR,KLOR-CON) 20 MEQ tablet Take 20 mEq by mouth  daily as needed.      . Syringe, Disposable, (B-D SYRINGE LUER-LOK 3CC) 3 ML MISC Inject 1 ml Testosterone every 14 days  100 each  3  . testosterone cypionate (DEPO-TESTOSTERONE) 200 MG/ML injection Inject 1 mL (200 mg total) into the muscle every 14 (fourteen) days.  10 mL  0  . traZODone (DESYREL) 100 MG tablet Take 1 tablet (100 mg total) by mouth daily.  90 tablet  3  . triamcinolone cream (KENALOG) 0.1 % Apply 1 application topically 2 (two) times daily. Applies to face        Allergies as of 02/27/2012 - Review Complete 02/27/2012  Allergen Reaction Noted  . Penicillins  10/16/2008    Family History  Problem Relation Age of Onset  . Alcohol abuse Father   . Arthritis Mother   . Colon cancer Brother   . Arthritis Maternal Grandmother   . Hyperlipidemia Mother   . Hypertension Brother   . Stroke Paternal Grandmother   . Throat cancer Father   . Esophageal cancer Father 6  . Alcohol abuse Father   . Uterine cancer Mother 61    History   Social History  . Marital Status: Married    Spouse Name: N/A    Number of Children: 3  . Years of Education: N/A   Occupational History  . disabled    Social History Main Topics  . Smoking status: Former Smoker -- 0.5 packs/day for 22 years    Types: Cigarettes    Quit date: 09/24/1992  . Smokeless tobacco: Not on file  . Alcohol Use: 0.0 oz/week     rarley  . Drug Use: Not on file  . Sexually Active: Not on file   Other Topics Concern  . Not on file    Social History Narrative   Retired, disabled 1994 Quadreplegic    Review of Systems: As per history of present illness, otherwise negative  Physical Exam: Vital signs in last 24 hours: Temp:  [98 F (36.7 C)-98.4 F (36.9 C)] 98.4 F (36.9 C) (07/19 2018) Pulse Rate:  [99-104] 104  (07/19 2018) Resp:  [18-20] 20  (07/19 2018) BP: (118-129)/(75-82) 118/75 mmHg (07/19 2018) SpO2:  [93 %-98 %] 93 % (07/19 2018)   Constitutional: awake, alert, NAD HEENT: Normocephalic and atraumatic. Oropharynx is clear and moist. No oropharyngeal exudate. Conjunctivae are normal. No scleral icterus.  Neck: Neck supple. Trachea midline. Prior tracheostomy scar  Cardiovascular: Normal rate, regular rhythm and intact distal pulses. No M/R/G  Pulmonary/chest: Effort normal and breath sounds normal. No wheezing, rales or rhonchi.  Abdominal: Soft, nontender, nondistended. Bowel sounds active throughout. There are no masses palpable. No hepatosplenomegaly.  Extremities: no clubbing, cyanosis, or edema  Skin: Skin is warm and dry. No rashes noted.  Psychiatric: Normal mood and affect. Behavior is normal.   Lab Results:  Basename 02/27/12 1959 02/27/12 1755  WBC 17.8* 16.8*  HGB 15.0 15.7  HCT 44.9 46.3  PLT 283 249   BMET  Basename 02/27/12 1755  NA 135  K 3.8  CL 99  CO2 23  GLUCOSE 162*  BUN 11  CREATININE 0.61  CALCIUM 9.2   LFT  Basename 02/27/12 1755  PROT 7.5  ALBUMIN 3.0*  AST 16  ALT 24  ALKPHOS 80  BILITOT 0.4  BILIDIR --  IBILI --   Studies/Results: Dg Abd 1 View  02/27/2012  *RADIOLOGY REPORT*  Clinical Data: Rectal bleeding.  Multiple colon polyps removed on 02/24/2012.  ABDOMEN - 1  VIEW  Comparison: None.  Findings: There are no dilated loops of large or small bowel. There is air scattered throughout the nondistended colon. Phleboliths in the pelvis.  Evidence of prior cholecystectomy.  IMPRESSION: Benign-appearing abdomen.  Original Report Authenticated By: Gwynn Burly, M.D.   Dg Abd Decub  02/27/2012  *RADIOLOGY REPORT*  Clinical Data: Rectal bleeding.  Evaluate for free air.  ABDOMEN - 1 VIEW DECUBITUS  Comparison: Abdominal radiographs 02/27/2012.  Findings: Left lateral decubitus view of the abdomen demonstrates some gas and stool in the ascending colon.  There are some small air fluid levels in the central abdomen, likely within the small bowel.  These loops of small bowel do not appear to be dilated.  No gross evidence of pneumoperitoneum is identified.  IMPRESSION: 1.  No evidence of pneumoperitoneum.  Original Report Authenticated By: Florencia Reasons, M.D.    Previous Endoscopies: Colonoscopy 02/24/2012 -- 5 polyps removed in the ascending and transverse colon. These polyps were removed with cold snare and cold forceps. 2 polyps removed in the descending colon, one polyp 15 mm removed with hot snare. The other retrieved with cold snare. 9 Polyps in the rectum and distal sigmoid removed with cold snare Pathology - adenomatous polyps and hyperplastic polyp  Impression / Plan: 56 y.o. male with a past medical history of colon polyps status post polypectomy 3 days ago, history of brainstem stroke leaving him quadriplegic, hypogonadism, hypertension who presented to the emergency department this evening after developing hematochezia.  1. Hematochezia -- the patient's presentation is consistent with post-polypectomy bleeding. The most likely lesion is the polypectomy site in the descending colon, as this polyp was removed with hot snare. His presentation is consistent with delayed post-polypectomy bleeding, which usually occurs when polyps are taken off using cautery. He is currently hemodynamically stable, x-ray is benign, and his hemoglobin is normal. It is likely that his hemoglobin will drop further, given that the bleeding is acute. We'll proceed with flexible sigmoidoscopy versus colonoscopy to try to identify the site of bleeding and provide  hemostasis. Procedure discussed with patient, wife, and daughter and they are agreeable to proceed. Procedure to be done at bedside in the ICU setting.    LOS: 0 days   Soham Hollett M  02/27/2012, 8:28 PM

## 2012-02-28 ENCOUNTER — Inpatient Hospital Stay (HOSPITAL_COMMUNITY): Payer: Medicare Other

## 2012-02-28 DIAGNOSIS — I10 Essential (primary) hypertension: Secondary | ICD-10-CM

## 2012-02-28 DIAGNOSIS — D62 Acute posthemorrhagic anemia: Secondary | ICD-10-CM | POA: Diagnosis present

## 2012-02-28 DIAGNOSIS — IMO0002 Reserved for concepts with insufficient information to code with codable children: Principal | ICD-10-CM

## 2012-02-28 LAB — URINE CULTURE: Colony Count: 75000

## 2012-02-28 LAB — CBC WITH DIFFERENTIAL/PLATELET
Basophils Absolute: 0.1 10*3/uL (ref 0.0–0.1)
Basophils Absolute: 0.1 10*3/uL (ref 0.0–0.1)
Basophils Relative: 1 % (ref 0–1)
Basophils Relative: 0 % (ref 0–1)
HCT: 34.5 % — ABNORMAL LOW (ref 39.0–52.0)
Hemoglobin: 11.5 g/dL — ABNORMAL LOW (ref 13.0–17.0)
Lymphocytes Relative: 8 % — ABNORMAL LOW (ref 12–46)
Lymphocytes Relative: 8 % — ABNORMAL LOW (ref 12–46)
MCHC: 33.5 g/dL (ref 30.0–36.0)
Monocytes Absolute: 1.8 10*3/uL — ABNORMAL HIGH (ref 0.1–1.0)
Monocytes Relative: 10 % (ref 3–12)
Neutro Abs: 15.7 10*3/uL — ABNORMAL HIGH (ref 1.7–7.7)
Neutro Abs: 15.2 10*3/uL — ABNORMAL HIGH (ref 1.7–7.7)
Neutrophils Relative %: 82 % — ABNORMAL HIGH (ref 43–77)
Neutrophils Relative %: 82 % — ABNORMAL HIGH (ref 43–77)
RDW: 16.4 % — ABNORMAL HIGH (ref 11.5–15.5)
RDW: 16.5 % — ABNORMAL HIGH (ref 11.5–15.5)
WBC: 19 10*3/uL — ABNORMAL HIGH (ref 4.0–10.5)
WBC: 18.7 10*3/uL — ABNORMAL HIGH (ref 4.0–10.5)

## 2012-02-28 LAB — CBC
Hemoglobin: 11.4 g/dL — ABNORMAL LOW (ref 13.0–17.0)
MCH: 29.2 pg (ref 26.0–34.0)
MCHC: 33 g/dL (ref 30.0–36.0)
Platelets: 252 10*3/uL (ref 150–400)

## 2012-02-28 LAB — COMPREHENSIVE METABOLIC PANEL
AST: 13 U/L (ref 0–37)
Albumin: 2.4 g/dL — ABNORMAL LOW (ref 3.5–5.2)
Alkaline Phosphatase: 62 U/L (ref 39–117)
CO2: 26 mEq/L (ref 19–32)
Chloride: 102 mEq/L (ref 96–112)
Creatinine, Ser: 0.74 mg/dL (ref 0.50–1.35)
GFR calc non Af Amer: >90 mL/min (ref >90)
Potassium: 4.2 mEq/L (ref 3.5–5.1)
Total Bilirubin: 0.3 mg/dL (ref 0.3–1.2)

## 2012-02-28 LAB — GLUCOSE, CAPILLARY
Glucose-Capillary: 221 mg/dL — ABNORMAL HIGH (ref 70–99)
Glucose-Capillary: 150 mg/dL — ABNORMAL HIGH (ref 70–99)
Glucose-Capillary: 159 mg/dL — ABNORMAL HIGH (ref 70–99)
Glucose-Capillary: 154 mg/dL — ABNORMAL HIGH (ref 70–99)
Glucose-Capillary: 178 mg/dL — ABNORMAL HIGH (ref 70–99)

## 2012-02-28 LAB — HEMOGLOBIN A1C
Hgb A1c MFr Bld: 7.3 % — ABNORMAL HIGH (ref <5.7)
Mean Plasma Glucose: 163 mg/dL — ABNORMAL HIGH (ref <117)

## 2012-02-28 MED ORDER — INSULIN ASPART 100 UNIT/ML ~~LOC~~ SOLN
0.0000 [IU] | Freq: Three times a day (TID) | SUBCUTANEOUS | Status: DC
  Administered 2012-02-28: 2 [IU] via SUBCUTANEOUS
  Administered 2012-02-28 – 2012-02-29 (×2): 1 [IU] via SUBCUTANEOUS
  Administered 2012-02-29: 3 [IU] via SUBCUTANEOUS

## 2012-02-28 MED ORDER — PANTOPRAZOLE SODIUM 40 MG PO TBEC
40.0000 mg | DELAYED_RELEASE_TABLET | Freq: Two times a day (BID) | ORAL | Status: DC
  Administered 2012-02-28 – 2012-02-29 (×2): 40 mg via ORAL
  Filled 2012-02-28 (×5): qty 1

## 2012-02-28 MED ORDER — ADULT MULTIVITAMIN W/MINERALS CH
1.0000 | ORAL_TABLET | Freq: Every day | ORAL | Status: DC
  Administered 2012-02-28 – 2012-02-29 (×2): 1 via ORAL
  Filled 2012-02-28 (×2): qty 1

## 2012-02-28 MED ORDER — TRAZODONE HCL 100 MG PO TABS
100.0000 mg | ORAL_TABLET | Freq: Every day | ORAL | Status: DC
  Administered 2012-02-29: 100 mg via ORAL
  Filled 2012-02-28 (×3): qty 1

## 2012-02-28 MED ORDER — POLYETHYLENE GLYCOL 3350 17 G PO PACK
17.0000 g | PACK | Freq: Every day | ORAL | Status: DC
  Filled 2012-02-28: qty 1

## 2012-02-28 MED ORDER — IOHEXOL 300 MG/ML  SOLN
100.0000 mL | Freq: Once | INTRAMUSCULAR | Status: AC | PRN
  Administered 2012-02-28: 100 mL via INTRAVENOUS

## 2012-02-28 NOTE — Progress Notes (Signed)
[facility] Progress Note  Subjective: No further bleeding. Patient feels better.  Less lower abd pain.  Thirsty.  Wife at bedside.  Objective:  Vital signs in last 24 hours: Temp:  [97.8 F (36.6 C)-98.6 F (37 C)] 97.8 F (36.6 C) (07/20 0800) Pulse Rate:  [76-120] 79  (07/20 1000) Resp:  [11-20] 16  (07/20 1000) BP: (98-150)/(61-90) 116/71 mmHg (07/20 1000) SpO2:  [92 %-100 %] 95 % (07/20 1000) Weight:  [180 lb 5.4 oz (81.8 kg)-182 lb 12.2 oz (82.9 kg)] 182 lb 12.2 oz (82.9 kg) (07/20 0000)   Gen: awake, alert, NAD HEENT: anicteric, op clear CV: RRR, no mrg Pulm: CTA b/l Abd: soft, NT/ND, +BS throughout Ext: no c/c/e  Intake/Output from previous day: 07/19 0701 - 07/20 0700 In: 1270 [I.V.:1250; IV Piggyback:20] Out: 541 [Urine:540] Intake/Output this shift: Total I/O In: 350 [P.O.:125; I.V.:225] Out: 85 [Urine:85]  Lab Results:  Basename 02/28/12 0756 02/28/12 0148 02/27/12 1959  WBC 18.7* 19.0* 17.8*  HGB 11.5* 11.8* 15.0  HCT 34.5* 35.2* 44.9  PLT 239 232 283   BMET  Basename 02/28/12 0148 02/27/12 1755  NA 137 135  K 4.2 3.8  CL 102 99  CO2 26 23  GLUCOSE 219* 162*  BUN 14 11  CREATININE 0.74 0.61  CALCIUM 8.2* 9.2   LFT  Basename 02/28/12 0148  PROT 5.7*  ALBUMIN 2.4*  AST 13  ALT 19  ALKPHOS 62  BILITOT 0.3  BILIDIR --  IBILI --   PT/INR  Basename 02/27/12 1959  LABPROT 13.6  INR 1.02   Hepatitis Panel No results found for this basename: HEPBSAG,HCVAB,HEPAIGM,HEPBIGM in the last 72 hours  Studies/Results: Ct Abdomen Pelvis Wo Contrast  02/27/2012  *RADIOLOGY REPORT*  Clinical Data: Abdominal pain and rectal bleeding after colonoscopy today.  CT ABDOMEN AND PELVIS WITHOUT CONTRAST  Technique:  Multidetector CT imaging of the abdomen and pelvis was performed following the standard protocol without intravenous contrast.  Comparison: None.  Findings: Mild dependent atelectasis in the lung bases.  Surgical absence of the  gallbladder.  The unenhanced appearance of the liver, spleen, pancreas, adrenal glands, and retroperitoneal lymph nodes is unremarkable.  There is no hydronephrosis in the kidneys.  There is a nodular soft tissue prominence arising from the lower pole of the left kidney which measures about 3.4 cm in diameter and has a slightly heterogeneous density.  A solid renal mass should be excluded.  Follow-up with contrast enhanced CT scan is recommended.  The inferior vena cava is decompressed suggesting hypovolemia.  The stomach and small bowel are decompressed without abnormal distension.  The colon is not abnormally distended and there is no significant wall thickening appreciated.  The transverse and descending colon and rectosigmoid colon are filled with fluid which has somewhat increased density.  This is nonspecific but hemorrhagic fluid is not excluded, given the history.  There is no evidence of any free fluid or free air in the abdomen.  Nothing to suggest colonic perforation.  There is umbilical hernia containing fat and measuring about 2.5 x 3.3 cm. There are two nodules in the right pararenal fat measuring 7-9 mm diameter.  These may represent lymph nodes.  Pelvis:  Mild prominence of the prostate gland that 2.6 x 4.3 cm. No bladder wall thickening.  No significant pelvic lymphadenopathy. No free or loculated pelvic fluid collections.  The appendix is normal.  No inflammatory changes around the sigmoid colon. Degenerative changes in the lumbar spine.  Suspicion of a solid  mass arising from the lower pole  IMPRESSION: Suspicion of a solid mass arising from the lower pole of the left kidney.  Contrast enhanced CT is recommended for further evaluation.  No evidence of colonic wall thickening, perforation, or inflammatory change.  The left colon is diffusely fluid-filled with higher density fluid which could represent hemorrhage. Flattening of the inferior vena cava suggest hypovolemia. Umbilical hernia containing  fat.  Original Report Authenticated By: Marlon Pel, M.D.   Dg Abd 1 View  02/27/2012  *RADIOLOGY REPORT*  Clinical Data: Rectal bleeding.  Multiple colon polyps removed on 02/24/2012.  ABDOMEN - 1 VIEW  Comparison: None.  Findings: There are no dilated loops of large or small bowel. There is air scattered throughout the nondistended colon. Phleboliths in the pelvis.  Evidence of prior cholecystectomy.  IMPRESSION: Benign-appearing abdomen.  Original Report Authenticated By: Gwynn Burly, M.D.   Dg Abd Decub  02/27/2012  *RADIOLOGY REPORT*  Clinical Data: Rectal bleeding.  Evaluate for free air.  ABDOMEN - 1 VIEW DECUBITUS  Comparison: Abdominal radiographs 02/27/2012.  Findings: Left lateral decubitus view of the abdomen demonstrates some gas and stool in the ascending colon.  There are some small air fluid levels in the central abdomen, likely within the small bowel.  These loops of small bowel do not appear to be dilated.  No gross evidence of pneumoperitoneum is identified.  IMPRESSION: 1.  No evidence of pneumoperitoneum.  Original Report Authenticated By: Florencia Reasons, M.D.     Assessment / Plan: 56 yo admitted with hematochezia secondary to post-polypectomy bleeding s/p repeat colon with epi injection and endo-clipping.  1.  Post-polyp bleeding -- Hgb is lower than baseline as expected, but stable over the last 2 readings.  No further bleeding or BM since colonoscopy last pm.  Next BM will have blood and clots, b/c there is retained blood in colon as seen last night.  Best I can tell the bleeding is controlled now and I do think I found the culprit polypectomy site.  For now, will advance diet to full liquids.  Monitor for rebleeding, Hgb/HCT.  If Hgb stable, I expect he can go home soon. I would hold asa for another 5 days, and Plavix for another 7.  Principal Problem:  *Post-polypectomy bleeding Active Problems:  Abdominal  pain, other specified site  Rectal bleeding   Anemia associated with acute blood loss     LOS: 1 day   Vannak Montenegro M  02/28/2012, 10:51 AM

## 2012-02-28 NOTE — Progress Notes (Addendum)
[facility] PROGRESS NOTE  Austin [name] WUJ:[id number] DOB: 12-04-1955 DOA: 02/27/2012 PCP: Kristian Covey, MD  Assessment/Plan: 1-Post-polypectomy bleeding: s/p hemostatic colonoscopy by GI; at this point no further bleeding. Will follow Hgb; hold ASA and Plavix and advance diet. protonix change to PO.  2-Abdominal  pain, other specified site: lower abdomen; ??polypectomy vs UTI. Will use PRN analgesics and continue abx's for UTI.  3-Rectal bleeding: due to polypectomy; tx as above.  4-HTN: stable and well controlled. Will continue current regimen.  5-Leukocytosis: due to UTI. Continue abx's; will follow CBC trend.  6-Proteus mirabilis UTI: continue ciprofloxacin.  7-DM: A1C 7.3; will follow carb modify diet and SSI.  8-ABLA: no need for transfusion; Will follow Hgb trend.  9-?? Kidney mass: will due CT abd and pelvis with contrast for better evaluation as recommended by radiology  DVT:SCD's.   Code Status: full Family Communication: Wife at bedside; updated, plan and future care discussed. Disposition Plan: home when medically stable   Brief narrative: 56 y/o male with PMH of DM, CVA with residual quadriplegia, HTN and colonic polyps s/p plypectomy on 02/24/12; came to the hospital due to copious rectal bleeding.  Consultants:  GI (Dr. Margretta Sidle)  Procedures:  colonoscopy  Antibiotics:  Ciprofloxacin  HPI/Subjective: AAOX3; no further bleeding. Felling better and without any CP, SOB, fever or any other complaints  Objective: Filed Vitals:   02/28/12 0600 02/28/12 0700 02/28/12 0800 02/28/12 0900  BP: 107/62 106/63 135/68 118/80  Pulse: 88 76 82 84  Temp:   97.8 F (36.6 C)   TempSrc:   Oral   Resp: 15 12 14 14   Height:      Weight:      SpO2: 94% 92% 95% 95%    Intake/Output Summary (Last 24 hours) at 02/28/12 1018 Last data filed at 02/28/12 0900  Gross per 24 hour  Intake   1420 ml  Output    626 ml  Net    794 ml     Exam:   General:  AAOX#; no acute distress.  Cardiovascular: RRR, no M/G/R  Respiratory: CTA  Abdomen: soft, no guarding; positive BS and just mild soreness on his lower abdomen.  Neurologic: no new focal motor or sensory deficit.  Data Reviewed: Basic Metabolic Panel:  Lab 02/28/12 9562 02/27/12 1755  NA 137 135  K 4.2 3.8  CL 102 99  CO2 26 23  GLUCOSE 219* 162*  BUN 14 11  CREATININE 0.74 0.61  CALCIUM 8.2* 9.2  MG -- --  PHOS -- --   Liver Function Tests:  Lab 02/28/12 0148 02/27/12 1755  AST 13 16  ALT 19 24  ALKPHOS 62 80  BILITOT 0.3 0.4  PROT 5.7* 7.5  ALBUMIN 2.4* 3.0*   CBC:  Lab 02/28/12 0756 02/28/12 0148 02/27/12 1959 02/27/12 1755  WBC 18.7* 19.0* 17.8* 16.8*  NEUTROABS 15.2* 15.7* 13.9* 13.5*  HGB 11.5* 11.8* 15.0 15.7  HCT 34.5* 35.2* 44.9 46.3  MCV 88.5 87.6 87.5 87.7  PLT 239 232 283 249   Cardiac Enzymes:  Lab 02/27/12 1755  CKTOTAL --  CKMB --  CKMBINDEX --  TROPONINI <0.30   CBG:  Lab 02/28/12 0806 02/28/12 0422 02/28/12 0040  GLUCAP 150* 173* 221*    Recent Results (from the past 240 hour(s))  URINE CULTURE     Status: Normal   Collection Time   02/25/12 10:48 AM      Component Value Range Status Comment   Culture PROTEUS MIRABILIS   Final  Colony Count 75,000 COLONIES/ML   Final    Organism ID, Bacteria PROTEUS MIRABILIS   Final   MRSA PCR SCREENING     Status: Normal   Collection Time   02/27/12  9:39 PM      Component Value Range Status Comment   MRSA by PCR NEGATIVE  NEGATIVE Final      Studies: Ct Abdomen Pelvis Wo Contrast  02/27/2012  *RADIOLOGY REPORT*  Clinical Data: Abdominal pain and rectal bleeding after colonoscopy today.  CT ABDOMEN AND PELVIS WITHOUT CONTRAST  Technique:  Multidetector CT imaging of the abdomen and pelvis was performed following the standard protocol without intravenous contrast.  Comparison: None.  Findings: Mild dependent atelectasis in the lung bases.  Surgical absence of the  gallbladder.  The unenhanced appearance of the liver, spleen, pancreas, adrenal glands, and retroperitoneal lymph nodes is unremarkable.  There is no hydronephrosis in the kidneys.  There is a nodular soft tissue prominence arising from the lower pole of the left kidney which measures about 3.4 cm in diameter and has a slightly heterogeneous density.  A solid renal mass should be excluded.  Follow-up with contrast enhanced CT scan is recommended.  The inferior vena cava is decompressed suggesting hypovolemia.  The stomach and small bowel are decompressed without abnormal distension.  The colon is not abnormally distended and there is no significant wall thickening appreciated.  The transverse and descending colon and rectosigmoid colon are filled with fluid which has somewhat increased density.  This is nonspecific but hemorrhagic fluid is not excluded, given the history.  There is no evidence of any free fluid or free air in the abdomen.  Nothing to suggest colonic perforation.  There is umbilical hernia containing fat and measuring about 2.5 x 3.3 cm. There are two nodules in the right pararenal fat measuring 7-9 mm diameter.  These may represent lymph nodes.  Pelvis:  Mild prominence of the prostate gland that 2.6 x 4.3 cm. No bladder wall thickening.  No significant pelvic lymphadenopathy. No free or loculated pelvic fluid collections.  The appendix is normal.  No inflammatory changes around the sigmoid colon. Degenerative changes in the lumbar spine.  Suspicion of a solid mass arising from the lower pole  IMPRESSION: Suspicion of a solid mass arising from the lower pole of the left kidney.  Contrast enhanced CT is recommended for further evaluation.  No evidence of colonic wall thickening, perforation, or inflammatory change.  The left colon is diffusely fluid-filled with higher density fluid which could represent hemorrhage. Flattening of the inferior vena cava suggest hypovolemia. Umbilical hernia containing  fat.  Original Report Authenticated By: Marlon Pel, M.D.   Dg Abd 1 View  02/27/2012  *RADIOLOGY REPORT*  Clinical Data: Rectal bleeding.  Multiple colon polyps removed on 02/24/2012.  ABDOMEN - 1 VIEW  Comparison: None.  Findings: There are no dilated loops of large or small bowel. There is air scattered throughout the nondistended colon. Phleboliths in the pelvis.  Evidence of prior cholecystectomy.  IMPRESSION: Benign-appearing abdomen.  Original Report Authenticated By: Gwynn Burly, M.D.   Dg Abd Decub  02/27/2012  *RADIOLOGY REPORT*  Clinical Data: Rectal bleeding.  Evaluate for free air.  ABDOMEN - 1 VIEW DECUBITUS  Comparison: Abdominal radiographs 02/27/2012.  Findings: Left lateral decubitus view of the abdomen demonstrates some gas and stool in the ascending colon.  There are some small air fluid levels in the central abdomen, likely within the small bowel.  These loops of small  bowel do not appear to be dilated.  No gross evidence of pneumoperitoneum is identified.  IMPRESSION: 1.  No evidence of pneumoperitoneum.  Original Report Authenticated By: Florencia Reasons, M.D.    Scheduled Meds:   . atorvastatin  20 mg Oral Daily  . ciprofloxacin  400 mg Intravenous Once  . ciprofloxacin  500 mg Oral BID  . lisinopril  10 mg Oral Daily   And  . hydrochlorothiazide  12.5 mg Oral Daily  . insulin aspart  0-9 Units Subcutaneous TID WC  . oxybutynin  5 mg Oral BID  . pantoprazole  40 mg Oral BID AC  . polyethylene glycol  17 g Oral Daily  . sodium chloride  500 mL Intravenous Once  . sodium chloride  3 mL Intravenous Q12H  . traZODone  100 mg Oral QHS  . DISCONTD: insulin aspart  0-9 Units Subcutaneous TID WC  . DISCONTD: insulin aspart  0-9 Units Subcutaneous Q4H  . DISCONTD: lisinopril-hydrochlorothiazide  1 tablet Oral Daily  . DISCONTD: pantoprazole (PROTONIX) IV  40 mg Intravenous Q12H  . DISCONTD: traZODone  100 mg Oral Daily   Continuous Infusions:   . sodium  chloride 75 mL/hr (02/28/12 0558)    Principal Problem:  *Post-polypectomy bleeding Active Problems:  Abdominal  pain, other specified site  Rectal bleeding    Time spent: >30 minutes    Kailen Hinkle  [facility] Pager [phone]. If 8PM-8AM, please contact night-coverage at [email or web address], password [facility] 02/28/2012, 10:18 AM  LOS: 1 day

## 2012-02-28 NOTE — Progress Notes (Signed)
We receive him from ICU at this time in no distress.  His wife is with him.  He denies any pain or discomfort, and states he hasn't had a b.m. Since yesterday.

## 2012-02-29 DIAGNOSIS — D62 Acute posthemorrhagic anemia: Secondary | ICD-10-CM

## 2012-02-29 LAB — CBC
Platelets: 241 10*3/uL (ref 150–400)
RBC: 3.56 MIL/uL — ABNORMAL LOW (ref 4.22–5.81)
RDW: 16.5 % — ABNORMAL HIGH (ref 11.5–15.5)
WBC: 16.4 10*3/uL — ABNORMAL HIGH (ref 4.0–10.5)

## 2012-02-29 LAB — HEMOGLOBIN AND HEMATOCRIT, BLOOD: Hemoglobin: 10.9 g/dL — ABNORMAL LOW (ref 13.0–17.0)

## 2012-02-29 MED ORDER — CLOPIDOGREL BISULFATE 75 MG PO TABS: 75.0000 mg | ORAL_TABLET | Freq: Every day | ORAL | Status: DC

## 2012-02-29 MED ORDER — BISACODYL 10 MG RE SUPP
10.0000 mg | Freq: Once | RECTAL | Status: AC
  Administered 2012-02-29: 10 mg via RECTAL
  Filled 2012-02-29: qty 1

## 2012-02-29 MED ORDER — ASPIRIN EC 325 MG PO TBEC: 325.0000 mg | DELAYED_RELEASE_TABLET | Freq: Every day | ORAL | Status: DC

## 2012-02-29 MED ORDER — PANTOPRAZOLE SODIUM 40 MG PO TBEC: 40.0000 mg | DELAYED_RELEASE_TABLET | Freq: Every day | ORAL | Status: DC

## 2012-02-29 MED ORDER — POLYETHYLENE GLYCOL 3350 17 G PO PACK: 17.0000 g | PACK | Freq: Every day | ORAL | Status: AC

## 2012-02-29 NOTE — Discharge Summary (Signed)
Physician Discharge Summary  Austin [name] RUE:[id number] DOB: 1955-11-11 DOA: 02/27/2012  PCP: Kristian Covey, MD  Admit date: 02/27/2012 Discharge date: 02/29/2012  Recommendations for Outpatient Follow-up:  1. Follow with PCP in 7 days (will be important to repeat a CBC in order to follow hemoglobin levels; patient will also require most likely glipizide since he's had been having CBGs in the 160-220 throughout hospitalization and his A1c is 7.3; patient will also require close followup with a urologist for further management of his left renal cell carcinoma diagnosed incidentally by CT scan of his abdomen and pelvis) 2. Patient has been instructed to follow a low residue diet and to keep himself hydrated. 3. Patient will stop aspirin and Plavix also followup with PCP in 7 days and if no further bleeding hemoglobin stable these medications can be resumed.  Discharge Diagnoses:  1-Post-polypectomy bleeding 2-Anemia associated with acute blood loss 3-Rectal bleeding 4-Left renal cell carcinoma (left kidney) 5-Hypogonadism 6-Hx of CVA 7-Quadriplegia(residual CVA deficit) 8-HTN 9-Diabetes (A1C 7.3) 10-neurogenic bladder 11-Proteus mirabilis UTI   Discharge Condition: Patient discharged in a stable and improved condition. He will follow with PCP in 7 days he wanted to have his hemoglobin checked and also to arrange further evaluation/treatment for his newly diagnosed left renal cell carcinoma. Aspirin and Plavix has been discontinue him to followup with PCP if no further bleeding and hemoglobin stable these medications can be resumed at that time.  Diet recommendation: Low residue diet.  History of present illness:  56 yo male with quadriplegia, who recently had colonscopy this past Tuesday=>15 polyps removed per pt, apparently was at home this afternoon when his wife notice some rectal bleeding about 3pm. She called 911 and brought the patient to the hospital. In the ER pt continued to  have some rectal bleeding. Pt has c/o abdominal pain RLQ, sharp pain, intermittent. Has slight nausea. Denies emesis, constipation, black stool, fever, chills.   Hospital Course:  1-Post-polypectomy bleeding: s/p hemostatic colonoscopy by GI; at this point no further significant bleeding. Hgb 10.9 at discharge. Per GI recommendations aspirin and Plavix will be held on to follow with PCP in 7 days; in no further bleeding hemoglobin remained to be stable these medications can be resumed. Patient discharged on Protonix  2-Abdominal pain, other specified site (Rai lower quadrant): ??polypectomy vs UTI. Without any abdominal pain at the moment of discharge. Patient will finish antibiotic for his UTI as instructed we'll follow with PCP for further evaluation and treatment. Patient instructed to follow a low residue diet.  3-Rectal bleeding: due to polypectomy; tx as above.   4-HTN: stable and well controlled. Will continue current regimen.   5-Leukocytosis: due to UTI. Continue abx's; will follow CBC trend during follow with PCP. Patient afebrile at discharge.   6-Proteus mirabilis UTI: continue ciprofloxacin as instructed; advised to keep himself hydrated.  7-DM: A1C 7.3; patient instructed to follow a low carbohydrate diet and will benefit of hypoglycemic therapy at this point. (Perhaps glipizide twice a day).   8-ABLA: no need for transfusion; Hgb stable and at 10.9 at discharge. No active bleeding. Continue holding ASA and Plavix.  9-Kidney mass compatible with left renal cell carcinoma: At this point next step will be outpatient followup by urology in order to determine further management and treatment; (most likely partial versus total nephrectomy).  Procedures:  Colonoscopy  Consultations:  GI   Discharge Exam: Filed Vitals:   02/28/12 2200  BP: 117/72  Pulse: 88  Temp: 97.6 F (36.4 C)  Resp:    Filed Vitals:   02/28/12 1100 02/28/12 1243 02/28/12 1432 02/28/12 2200  BP:  115/77 121/79 118/70 117/72  Pulse: 85 88 87 88  Temp:  97.9 F (36.6 C) 97.5 F (36.4 C) 97.6 F (36.4 C)  TempSrc:  Oral Oral Oral  Resp: 14 16 16    Height:  6' (1.829 m)    Weight:  81.647 kg (180 lb)    SpO2: 96% 94% 95% 97%   General: Alert, awake and oriented x3, no acute distress Cardiovascular: Regular rate and rhythm, no gallops or rubs. S1 and S2 appreciated Respiratory: Clear to auscultation bilaterally Neurology: No new focal motor or sensory deficit.  Discharge Instructions  Discharge Orders    Future Appointments: Provider: Department: Dept Phone: Center:   07/26/2012 10:00 AM Kristian Covey, MD [facility] [phone] [facility]     Future Orders Please Complete By Expires   Diet - low sodium heart healthy      Discharge instructions      Comments:   -Follow with PCP in 7 days -Keep herself hydrated -Stop taking aspirin and Plavix as instructed him to follow with PCP. -Take medications as prescribed follow discharge instructions     Medication List  As of 02/29/2012  1:30 PM   STOP taking these medications         nitrofurantoin 100 MG capsule         TAKE these medications         aspirin EC 325 MG tablet   Take 1 tablet (325 mg total) by mouth daily. Stop taking these medications and to follow with your PCP      atorvastatin 20 MG tablet   Commonly known as: LIPITOR   Take 1 tablet (20 mg total) by mouth daily.      CENTRUM SILVER ULTRA MENS Tabs   Take by mouth daily.      ciprofloxacin 500 MG tablet   Commonly known as: CIPRO   Take 1 tablet (500 mg total) by mouth 2 (two) times daily.      clopidogrel 75 MG tablet   Commonly known as: PLAVIX   Take 1 tablet (75 mg total) by mouth daily. Stop medication or to follow with your PCP      Cranberry Fruit Concentrate 12600 MG Caps   Take 1 capsule by mouth daily.      escitalopram 10 MG tablet   Commonly known as: LEXAPRO   Take 1 tablet (10 mg total) by mouth daily.      Fish Oil  1000 MG Caps   Take 1 capsule by mouth 2 (two) times daily.      furosemide 40 MG tablet   Commonly known as: LASIX   Take 40 mg by mouth daily as needed. fluid      ketoconazole 2 % cream   Commonly known as: NIZORAL   APPLY  TO AFFECTED AREA TWICE DAILY AS NEEDED      lisinopril-hydrochlorothiazide 10-12.5 MG per tablet   Commonly known as: PRINZIDE,ZESTORETIC   Take 1 tablet by mouth daily.      NEEDLE (DISP) 18 G 18G X 1-1/2" Misc   Inject Testosterone IM as directed every 14 days      NEEDLE (DISP) 22 G 22G X 1-1/2" Misc   Inject testosterone IM every 14 days as directed      oxybutynin 5 MG tablet   Commonly known as: DITROPAN   Take 1 tablet (5 mg total) by mouth  2 (two) times daily.      pantoprazole 40 MG tablet   Commonly known as: PROTONIX   Take 1 tablet (40 mg total) by mouth daily.      polyethylene glycol packet   Commonly known as: MIRALAX / GLYCOLAX   Take 17 g by mouth daily.      potassium chloride SA 20 MEQ tablet   Commonly known as: K-DUR,KLOR-CON   Take 20 mEq by mouth daily as needed.      Syringe (Disposable) 3 ML Misc   Inject 1 ml Testosterone every 14 days      testosterone cypionate 200 MG/ML injection   Commonly known as: DEPOTESTOTERONE CYPIONATE   Inject 1 mL (200 mg total) into the muscle every 14 (fourteen) days.      traZODone 100 MG tablet   Commonly known as: DESYREL   Take 1 tablet (100 mg total) by mouth daily.      triamcinolone cream 0.1 %   Commonly known as: KENALOG   Apply 1 application topically 2 (two) times daily. Applies to face           Follow-up Information    Schedule an appointment as soon as possible for a visit with Kristian Covey, MD.   Contact information:   [address] [place] [address] [phone]           The results of significant diagnostics from this hospitalization (including imaging, microbiology, ancillary and laboratory) are listed below for reference.     Significant Diagnostic Studies: Ct Abdomen Pelvis Wo Contrast  02/27/2012  *RADIOLOGY REPORT*  Clinical Data: Abdominal pain and rectal bleeding after colonoscopy today.  CT ABDOMEN AND PELVIS WITHOUT CONTRAST  Technique:  Multidetector CT imaging of the abdomen and pelvis was performed following the standard protocol without intravenous contrast.  Comparison: None.  Findings: Mild dependent atelectasis in the lung bases.  Surgical absence of the gallbladder.  The unenhanced appearance of the liver, spleen, pancreas, adrenal glands, and retroperitoneal lymph nodes is unremarkable.  There is no hydronephrosis in the kidneys.  There is a nodular soft tissue prominence arising from the lower pole of the left kidney which measures about 3.4 cm in diameter and has a slightly heterogeneous density.  A solid renal mass should be excluded.  Follow-up with contrast enhanced CT scan is recommended.  The inferior vena cava is decompressed suggesting hypovolemia.  The stomach and small bowel are decompressed without abnormal distension.  The colon is not abnormally distended and there is no significant wall thickening appreciated.  The transverse and descending colon and rectosigmoid colon are filled with fluid which has somewhat increased density.  This is nonspecific but hemorrhagic fluid is not excluded, given the history.  There is no evidence of any free fluid or free air in the abdomen.  Nothing to suggest colonic perforation.  There is umbilical hernia containing fat and measuring about 2.5 x 3.3 cm. There are two nodules in the right pararenal fat measuring 7-9 mm diameter.  These may represent lymph nodes.  Pelvis:  Mild prominence of the prostate gland that 2.6 x 4.3 cm. No bladder wall thickening.  No significant pelvic lymphadenopathy. No free or loculated pelvic fluid collections.  The appendix is normal.  No inflammatory changes around the sigmoid colon. Degenerative changes in the lumbar spine.  Suspicion  of a solid mass arising from the lower pole  IMPRESSION: Suspicion of a solid mass arising from the lower pole of the left  kidney.  Contrast enhanced CT is recommended for further evaluation.  No evidence of colonic wall thickening, perforation, or inflammatory change.  The left colon is diffusely fluid-filled with higher density fluid which could represent hemorrhage. Flattening of the inferior vena cava suggest hypovolemia. Umbilical hernia containing fat.  Original Report Authenticated By: Marlon Pel, M.D.   Dg Abd 1 View  02/27/2012  *RADIOLOGY REPORT*  Clinical Data: Rectal bleeding.  Multiple colon polyps removed on 02/24/2012.  ABDOMEN - 1 VIEW  Comparison: None.  Findings: There are no dilated loops of large or small bowel. There is air scattered throughout the nondistended colon. Phleboliths in the pelvis.  Evidence of prior cholecystectomy.  IMPRESSION: Benign-appearing abdomen.  Original Report Authenticated By: Gwynn Burly, M.D.   Ct Abdomen Pelvis W Contrast  02/28/2012  *RADIOLOGY REPORT*  Clinical Data: Left renal mass was seen on CT scan of the abdomen without contrast dated 02/27/2012  CT ABDOMEN AND PELVIS WITH CONTRAST  Technique:  Multidetector CT imaging of the abdomen and pelvis was performed following the standard protocol during bolus administration of intravenous contrast.  Contrast: OMNIPAQUE IOHEXOL 300 MG/ML  SOLN  Comparison: 02/27/2012  Findings: There is a hypervascular lobulated irregular 4.2 cm mass on the lower pole of the left kidney consistent with a renal cell carcinoma.  There are multiple blood vessels supplying this mass.  The left kidney is otherwise normal.  No evidence of renal vein invasion.  No visible adenopathy.  There is a 7 mm benign-appearing cyst in the lower pole of the right kidney.  The patient has slight linear atelectasis at the lung bases.  The liver, spleen, pancreas, and adrenal glands are normal. Gallbladder has been removed.  No  bile duct dilatation.  The bowel is normal including the terminal ileum and appendix. Foley catheter is in place in the bladder.  There is a small umbilical hernia containing only fat.  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  No visible metastatic disease at this time.  Original Report Authenticated By: Gwynn Burly, M.D.   Dg Abd Decub  02/27/2012  *RADIOLOGY REPORT*  Clinical Data: Rectal bleeding.  Evaluate for free air.  ABDOMEN - 1 VIEW DECUBITUS  Comparison: Abdominal radiographs 02/27/2012.  Findings: Left lateral decubitus view of the abdomen demonstrates some gas and stool in the ascending colon.  There are some small air fluid levels in the central abdomen, likely within the small bowel.  These loops of small bowel do not appear to be dilated.  No gross evidence of pneumoperitoneum is identified.  IMPRESSION: 1.  No evidence of pneumoperitoneum.  Original Report Authenticated By: Florencia Reasons, M.D.    Microbiology: Recent Results (from the past 240 hour(s))  URINE CULTURE     Status: Normal   Collection Time   02/25/12 10:48 AM      Component Value Range Status Comment   Culture PROTEUS MIRABILIS   Final    Colony Count 75,000 COLONIES/ML   Final    Organism ID, Bacteria PROTEUS MIRABILIS   Final   MRSA PCR SCREENING     Status: Normal   Collection Time   02/27/12  9:39 PM      Component Value Range Status Comment   MRSA by PCR NEGATIVE  NEGATIVE Final      Labs: Basic Metabolic Panel:  Lab 02/28/12 1610 02/27/12 1755  NA 137 135  K 4.2 3.8  CL  102 99  CO2 26 23  GLUCOSE 219* 162*  BUN 14 11  CREATININE 0.74 0.61  CALCIUM 8.2* 9.2  MG -- --  PHOS -- --   Liver Function Tests:  Lab 02/28/12 0148 02/27/12 1755  AST 13 16  ALT 19 24  ALKPHOS 62 80  BILITOT 0.3 0.4  PROT 5.7* 7.5  ALBUMIN 2.4* 3.0*   CBC:  Lab 02/29/12 1145 02/29/12 0022 02/28/12 1243 02/28/12 0756 02/28/12 0148 02/27/12 1959  02/27/12 1755  WBC -- 16.4* 18.4* 18.7* 19.0* 17.8* --  NEUTROABS -- -- -- 15.2* 15.7* 13.9* 13.5*  HGB 10.9* 10.5* 11.4* 11.5* 11.8* -- --  HCT 33.8* 31.3* 34.5* 34.5* 35.2* -- --  MCV -- 87.9 88.2 88.5 87.6 87.5 --  PLT -- 241 252 239 232 283 --   Cardiac Enzymes:  Lab 02/27/12 1755  CKTOTAL --  CKMB --  CKMBINDEX --  TROPONINI <0.30   CBG:  Lab 02/29/12 1150 02/29/12 0739 02/28/12 1640 02/28/12 1256 02/28/12 1137  GLUCAP 207* 136* 178* 154* 159*    Time coordinating discharge: >30 minutes  Signed: Jovonda Selner [id number]  [facility] 02/29/2012, 1:30 PM

## 2012-02-29 NOTE — Progress Notes (Signed)
At this time, his wife dresses him and he goes to their vehicle per his electronic w/c.  Instructions are given, all questions fielded by me, and receipt of inst. And rx signed by pt's. Spouse.

## 2012-02-29 NOTE — Progress Notes (Signed)
Patient seen, examined, and I agree with the above documentation, including the assessment and plan. No further bleeding.  Had enema this am with passing old blood.  No bright red blood.  Patient feels well. Hgb checked again 2 hours ago and 10.9, which is stable. Okay for d/c home from GI standpt. Resume plavix Friday.  CT with likely [facility], will place urology referral.

## 2012-02-29 NOTE — Progress Notes (Signed)
Patient ID: Austin [name], male   DOB: 1956-04-02, 56 y.o.   MRN: [id number] [facility] Progress Note  Subjective: Doing well up in wheel chair and wanting to go home- no BM's since procedure.  Ct abd/pelvis-4.2 cm renal cell Ca on  Left, no evidence for mets  Objective:  Vital signs in last 24 hours: Temp:  [97.5 F (36.4 C)-97.9 F (36.6 C)] 97.6 F (36.4 C) (07/20 2200) Pulse Rate:  [79-88] 88  (07/20 2200) Resp:  [14-16] 16  (07/20 1432) BP: (115-121)/(70-79) 117/72 mmHg (07/20 2200) SpO2:  [94 %-97 %] 97 % (07/20 2200) Weight:  [180 lb (81.647 kg)] 180 lb (81.647 kg) (07/20 1243) Last BM Date: 02/28/12 General:   Alert,  Well-developed,    in NAD Heart:  Regular rate and rhythm; no murmurs Pulm;clear Abdomen:  Soft, nontender and nondistended. Normal bowel sounds, without guarding, and without rebound.   Extremities:  Without edema. Neurologic:  Alert and  oriented x4;  grossly normal neurologically. Psych:  Alert and cooperative. Normal mood and affect.  Intake/Output from previous day: 07/20 0701 - 07/21 0700 In: 520 [P.O.:185; I.V.:335] Out: 1215 [Urine:1215] Intake/Output this shift:    Lab Results:  Basename 02/29/12 0022 02/28/12 1243 02/28/12 0756  WBC 16.4* 18.4* 18.7*  HGB 10.5* 11.4* 11.5*  HCT 31.3* 34.5* 34.5*  PLT 241 252 239   BMET  Basename 02/28/12 0148 02/27/12 1755  NA 137 135  K 4.2 3.8  CL 102 99  CO2 26 23  GLUCOSE 219* 162*  BUN 14 11  CREATININE 0.74 0.61  CALCIUM 8.2* 9.2   LFT  Basename 02/28/12 0148  PROT 5.7*  ALBUMIN 2.4*  AST 13  ALT 19  ALKPHOS 62  BILITOT 0.3  BILIDIR --  IBILI --   PT/INR  Basename 02/27/12 1959  LABPROT 13.6  INR 1.02     Assessment / Plan: #1 Complicated 56 yo male with acute post polypectomy bleed- stable s/p injection and endoclipping of descending colon polyp site on 7/20- no bleeding since Repeat hgb this afternoon Dulcolax supp this am If stable probably OK to  discharge home later today Resume Plavix on  Wednesday #2 anemia- secondary to blood loss #3 Incidental finding of a left renal cell Ca- will need urology referral ASAp as outpt- pt and wife aware   Principal Problem:  *Post-polypectomy bleeding Active Problems:  Abdominal  pain, other specified site  Rectal bleeding  Anemia associated with acute blood loss     LOS: 2 days   Lindsie Simar  02/29/2012, 9:27 AM

## 2012-03-01 ENCOUNTER — Telehealth: Payer: Self-pay | Admitting: *Deleted

## 2012-03-01 ENCOUNTER — Telehealth: Payer: Self-pay | Admitting: Oncology

## 2012-03-01 DIAGNOSIS — N2889 Other specified disorders of kidney and ureter: Secondary | ICD-10-CM

## 2012-03-01 NOTE — Telephone Encounter (Signed)
Postpolypectomy bleeding, resolved.      CT found likely RCC, needs urgent urology referral, and I guess Oncology.      Thanks      Wife expecting to hear about referrals.   Please check CBC in 10 days. Thanks      Reminder in to do CBC; referrals made.

## 2012-03-01 NOTE — Telephone Encounter (Signed)
Message copied by Florene Glen on Mon Mar 01, 2012  4:45 PM ------      Message from: Beverley Fiedler      Created: Sun Feb 29, 2012  2:00 PM       Postpolypectomy bleeding, resolved.            CT found likely RCC, needs urgent urology referral, and I guess Oncology.            Thanks            Wife expecting to hear about referrals.      Please check CBC in 10 days.  Thanks

## 2012-03-01 NOTE — Telephone Encounter (Signed)
Per Dr Rhea Belton, please refer pt to Urology and Oncology for renal cell cancer.   appt with [facility], Dr Patsi Sears, 03/03/12 at 2pm. Cancer Center will call the pt with an appt. Informed wife of the appt with Dr Patsi Sears; wife stated understanding.

## 2012-03-01 NOTE — Telephone Encounter (Signed)
Make sure pt has follow up to reassess within 1-2 weeks.

## 2012-03-01 NOTE — Telephone Encounter (Signed)
Pt wife calling to report the severe bleeding after his colonscopy, and the [place] report with spot on his kidney.  Dr Pyrtle/hospital started a new med, Protonix and wife wanted to make sure adding this med was OK.

## 2012-03-01 NOTE — Telephone Encounter (Signed)
Pt wife informed and she has urology and oncology appts yet this week, so she will schedule return OV next week sometime

## 2012-03-01 NOTE — Telephone Encounter (Signed)
S/w pt wife re appt for 7/26 @ 1:30 pm

## 2012-03-01 NOTE — Telephone Encounter (Signed)
Yes, this is OK to take.  Wanted to make sure he has follow up to reassess blood sugars and coordinate care with recent multiple issues.

## 2012-03-02 ENCOUNTER — Encounter (HOSPITAL_COMMUNITY): Payer: Self-pay | Admitting: Internal Medicine

## 2012-03-04 ENCOUNTER — Telehealth: Payer: Self-pay | Admitting: Oncology

## 2012-03-04 ENCOUNTER — Telehealth: Payer: Self-pay | Admitting: *Deleted

## 2012-03-04 NOTE — Telephone Encounter (Signed)
Received call from [facility] @ Dr. Lauro Franklin office and pt will keep appt w/FS for 7/26. Per Aram Beecham pt wife aware.

## 2012-03-04 NOTE — Telephone Encounter (Signed)
Dr Clelia Croft called back and states he will be glad to see the pt and give his advice on the renal cell carcinoma. He also stated he respects their decision to cancel. His advice would be surgical resection of the kidney, but I informed him Dr Patsi Sears stated he's a poor risk of being put to sleep per pt's wife. Spoke with wife again who also stated Dr Patsi Sears stated he will discuss this case with IR, she thinks Dr Fredia Sorrow, and see what he can offer. They are most happy with all the care pt has received and they will keep the appt with Dr Clelia Croft. Informed Melissa pt will keep the appt; also informed Dr Rhea Belton of the situation.

## 2012-03-04 NOTE — Telephone Encounter (Signed)
Dory Peru supervisor of appointments and registration at the [facility] called to inform us that pt cancelled his appt with Dr Clelia Croft. Pt saw Dr Patsi Sears yesterday and he stated he doesn't need chemo so he doesn't need to see Dr Clelia Croft.  Spoke with pt's wife and they are overwhelmed with the info right now, bu they are willing to see Dr Clelia Croft. Explained to wife that I have left a message for Dr Clelia Croft to call me back and I will call after speaking with him. lmom for Melissa not to cancel the appt yet. I did discuss the situation with Dr Rhea Belton.

## 2012-03-04 NOTE — Telephone Encounter (Signed)
Pt's wife called this morning to cx'd 7/26 new pt appt. Per wife pt has visit w/Dr. Patsi Sears @ [facility] (7/24) and they were told that pt did not need chemo right now so she wanted to cx'd appt. Pt was actually referred by Dr. Rhea Belton and both [facility] and Graciella Freer have been contacted re above. Appt has not been cancelled. Per Aram Beecham she will call me back re what to do about appt.

## 2012-03-05 ENCOUNTER — Other Ambulatory Visit: Payer: Medicare Other

## 2012-03-05 ENCOUNTER — Encounter: Payer: Self-pay | Admitting: Oncology

## 2012-03-05 ENCOUNTER — Ambulatory Visit (HOSPITAL_BASED_OUTPATIENT_CLINIC_OR_DEPARTMENT_OTHER): Payer: Medicare Other | Admitting: Oncology

## 2012-03-05 ENCOUNTER — Ambulatory Visit (HOSPITAL_BASED_OUTPATIENT_CLINIC_OR_DEPARTMENT_OTHER): Payer: Medicare Other

## 2012-03-05 VITALS — BP 116/72 | HR 87 | Temp 98.1°F | Ht 72.0 in | Wt 180.0 lb

## 2012-03-05 DIAGNOSIS — N289 Disorder of kidney and ureter, unspecified: Secondary | ICD-10-CM

## 2012-03-05 DIAGNOSIS — N2889 Other specified disorders of kidney and ureter: Secondary | ICD-10-CM

## 2012-03-05 NOTE — Progress Notes (Signed)
Patient came in today as a new patient with his wife and he has one insurance blue-medicare. I gave them a medicaid application to fill out and return to the department of social service here in [place] on [address].

## 2012-03-05 NOTE — Progress Notes (Signed)
Reason for Referral: Kidney Tumor.    HPI: Austin [name] is a 56 y.o. Male native of [place] and has been living in [place] for the last 5 years. 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 He presented to the emergency department on 7/19 after developing hematochezia. He developed the urge to while out with his wife this pm. She then noted red blood in his adult diaper. He had another episode of hematochezia at home and felt a bit light-headed, and she called 911. He was transported to the ED via EMS. He had a CT scan which showed 4.2 cm hypervascular tumor, likely renal cell cancer. No metastatic disease noted.  Patient bleeding improved, was discharged home and was seen at [facility] for evaluation.  Patient is asymptomatic today.  No hematuria or flank pain.     Past Medical History  Diagnosis Date  . Hx of colonic polyps   . Hypertension   . Pituitary adenoma   . Urinary incontinence   . Venous stasis     edema  . 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 Hypogonadism   . Cerebrovascular accident 1994    hx of brainstem stroke, residual diminished lung capacity  . Neuromuscular disorder     quadraplegic  :  Past Surgical History  Procedure Date  . Tracheostomy tube placement 02/1993    removed 04/1993  . Stomach peg 02/1993    removed 5-6 years later  . Cholecystectomy   . Pituitary surgery 2007, 2011  . Gamma knife radiation surgery 05/2010    for pituitary  . Vocal cord surgery     injected with collagen, then fat from stomach to improve speech s/p stroke  . Colonoscopy 02/27/2012    Procedure: COLONOSCOPY;  Surgeon: Beverley Fiedler, MD;  Location: [facility];  Service: Gastroenterology;  Laterality: N/A;  :  Current outpatient prescriptions:aspirin EC 325 MG tablet, Take 1 tablet (325 mg total) by mouth daily. Stop taking these medications and to follow with  your PCP, Disp: , Rfl: ;  atorvastatin (LIPITOR) 20 MG tablet, Take 1 tablet (20 mg total) by mouth daily., Disp: 90 tablet, Rfl: 3;  ciprofloxacin (CIPRO) 500 MG tablet, Take 1 tablet (500 mg total) by mouth 2 (two) times daily., Disp: 14 tablet, Rfl: 0 clopidogrel (PLAVIX) 75 MG tablet, Take 1 tablet (75 mg total) by mouth daily. Stop medication or to follow with your PCP, Disp: , Rfl: ;  Cranberry Fruit Concentrate 12600 MG CAPS, Take 1 capsule by mouth daily., Disp: , Rfl: ;  escitalopram (LEXAPRO) 10 MG tablet, Take 1 tablet (10 mg total) by mouth daily., Disp: 90 tablet, Rfl: 3;  furosemide (LASIX) 40 MG tablet, Take 40 mg by mouth daily as needed. fluid , Disp: , Rfl:  ketoconazole (NIZORAL) 2 % cream, APPLY  TO AFFECTED AREA TWICE DAILY AS NEEDED, Disp: 60 g, Rfl: 0;  lisinopril-hydrochlorothiazide (PRINZIDE,ZESTORETIC) 10-12.5 MG per tablet, Take 1 tablet by mouth daily., Disp: 90 tablet, Rfl: 3;  Multiple Vitamins-Minerals (CENTRUM SILVER ULTRA MENS) TABS, Take by mouth daily.  , Disp: , Rfl:  NEEDLE, DISP, 18 G (B-D BLUNT FILL NEEDLE) 18G X 1-1/2" MISC, Inject Testosterone IM as directed every 14 days, Disp: 100 each, Rfl: 3;  NEEDLE, DISP, 22 G (B-D HYPODERMIC NEEDLE 22GX1.5") 22G X 1-1/2" MISC, Inject testosterone IM every 14 days as directed, Disp: 100 each, Rfl: 3;  Omega-3 Fatty  Acids (FISH OIL) 1000 MG CAPS, Take 1 capsule by mouth 2 (two) times daily. , Disp: , Rfl:  oxybutynin (DITROPAN) 5 MG tablet, Take 1 tablet (5 mg total) by mouth 2 (two) times daily., Disp: 180 tablet, Rfl: 3;  pantoprazole (PROTONIX) 40 MG tablet, Take 1 tablet (40 mg total) by mouth daily., Disp: 30 tablet, Rfl: 0;  potassium chloride SA (K-DUR,KLOR-CON) 20 MEQ tablet, Take 20 mEq by mouth daily as needed., Disp: , Rfl:  Syringe, Disposable, (B-D SYRINGE LUER-LOK 3CC) 3 ML MISC, Inject 1 ml Testosterone every 14 days, Disp: 100 each, Rfl: 3;  testosterone cypionate (DEPO-TESTOSTERONE) 200 MG/ML injection, Inject 1 mL  (200 mg total) into the muscle every 14 (fourteen) days., Disp: 10 mL, Rfl: 0;  traZODone (DESYREL) 100 MG tablet, Take 1 tablet (100 mg total) by mouth daily., Disp: 90 tablet, Rfl: 3 triamcinolone cream (KENALOG) 0.1 %, Apply 1 application topically 2 (two) times daily. Applies to face, Disp: , Rfl: :    :  Allergies  Allergen Reactions  . Penicillins     REACTION: rash  :  Family History  Problem Relation Age of Onset  . Alcohol abuse Father   . Arthritis Mother   . Colon cancer Brother   . Arthritis Maternal Grandmother   . Hyperlipidemia Mother   . Hypertension Brother   . Stroke Paternal Grandmother   . Throat cancer Father   . Esophageal cancer Father 18  . Alcohol abuse Father   . Uterine cancer Mother 12  :  History   Social History  . Marital Status: Married    Spouse Name: N/A    Number of Children: 3  . Years of Education: N/A   Occupational History  . disabled    Social History Main Topics  . Smoking status: Former Smoker -- 0.5 packs/day for 22 years    Types: Cigarettes    Quit date: 09/24/1992  . Smokeless tobacco: Not on file  . Alcohol Use: 0.0 oz/week     rarley  . Drug Use: Not on file  . Sexually Active: Not on file   Other Topics Concern  . Not on file   Social History Narrative   Retired, disabled 1994 Quadreplegic  :  A comprehensive review of systems was negative.  Exam:  General appearance: alert Head: Normocephalic, without obvious abnormality, atraumatic Nose: Nares normal. Septum midline. Mucosa normal. No drainage or sinus tenderness. Throat: lips, mucosa, and tongue normal; teeth and gums normal Resp: clear to auscultation bilaterally Chest wall: no tenderness Cardio: regular rate and rhythm, S1, S2 normal, no murmur, click, rub or gallop Extremities: extremities normal, atraumatic, no cyanosis or edema  No results found for this basename: WBC:2,HGB:2,HCT:2,PLT:2 in the last 72 hours No results found for this  basename: NA:2,K:2,CL:2,CO2:2,GLUCOSE:2,BUN:2,CREATININE:2,CALCIUM:2 in the last 72 hours Lab Results  Component Value Date   WBC 16.4* 02/29/2012   HGB 10.9* 02/29/2012   HCT 33.8* 02/29/2012   MCV 87.9 02/29/2012   PLT 241 02/29/2012    Lab 02/28/12 0148  NA 137  K 4.2  CL 102  CO2 26  BUN 14  CREATININE 0.74  CALCIUM 8.2*  PROT 5.7*  BILITOT 0.3  ALKPHOS 62  ALT 19  AST 13  GLUCOSE 219*       Ct Abdomen Pelvis W Contrast  02/28/2012  *RADIOLOGY REPORT*  Clinical Data: Left renal mass was seen on CT scan of the abdomen without contrast dated 02/27/2012  CT ABDOMEN AND PELVIS WITH  CONTRAST  Technique:  Multidetector CT imaging of the abdomen and pelvis was performed following the standard protocol during bolus administration of intravenous contrast.  Contrast: OMNIPAQUE IOHEXOL 300 MG/ML  SOLN  Comparison: 02/27/2012  Findings: There is a hypervascular lobulated irregular 4.2 cm mass on the lower pole of the left kidney consistent with a renal cell carcinoma.  There are multiple blood vessels supplying this mass.  The left kidney is otherwise normal.  No evidence of renal vein invasion.  No visible adenopathy.  There is a 7 mm benign-appearing cyst in the lower pole of the right kidney.  The patient has slight linear atelectasis at the lung bases.  The liver, spleen, pancreas, and adrenal glands are normal. Gallbladder has been removed.  No bile duct dilatation.  The bowel is normal including the terminal ileum and appendix. Foley catheter is in place in the bladder.  There is a small umbilical hernia containing only fat.  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  No visible metastatic disease at this time.  Original Report Authenticated By: Gwynn Burly, M.D.    Assessment and Plan:   56 year old with 4.2 cm lobulated hypervascular mass likely renal cell carcinoma of the lower pole of the left kidney. No visible  metastatic disease at this time. I agree with Urological consultation for possible surgical resection weather a total or a partial nephrectomy.  If he is not a surgical candidate, then cryoablation would be a good alternative.  There is no role for chemotherapy or radiation therapy at this time. All of this questions answered today.  I will be happy to see him in the future as needed.

## 2012-03-10 ENCOUNTER — Encounter: Payer: Self-pay | Admitting: Family Medicine

## 2012-03-10 ENCOUNTER — Ambulatory Visit (INDEPENDENT_AMBULATORY_CARE_PROVIDER_SITE_OTHER): Payer: Medicare Other | Admitting: Family Medicine

## 2012-03-10 VITALS — BP 120/72 | Temp 98.0°F | Wt 181.0 lb

## 2012-03-10 DIAGNOSIS — I10 Essential (primary) hypertension: Secondary | ICD-10-CM

## 2012-03-10 DIAGNOSIS — R7309 Other abnormal glucose: Secondary | ICD-10-CM

## 2012-03-10 DIAGNOSIS — N2889 Other specified disorders of kidney and ureter: Secondary | ICD-10-CM

## 2012-03-10 DIAGNOSIS — D62 Acute posthemorrhagic anemia: Secondary | ICD-10-CM

## 2012-03-10 DIAGNOSIS — N289 Disorder of kidney and ureter, unspecified: Secondary | ICD-10-CM

## 2012-03-10 LAB — CBC WITH DIFFERENTIAL/PLATELET
Basophils Relative: 1.5 % (ref 0.0–3.0)
Eosinophils Absolute: 0.4 10*3/uL (ref 0.0–0.7)
Eosinophils Relative: 3.3 % (ref 0.0–5.0)
HCT: 33.5 % — ABNORMAL LOW (ref 39.0–52.0)
Hemoglobin: 10.7 g/dL — ABNORMAL LOW (ref 13.0–17.0)
Lymphs Abs: 1 10*3/uL (ref 0.7–4.0)
MCHC: 32 g/dL (ref 30.0–36.0)
MCV: 88.2 fl (ref 78.0–100.0)
Monocytes Absolute: 1.5 10*3/uL — ABNORMAL HIGH (ref 0.1–1.0)
Neutro Abs: 10.3 10*3/uL — ABNORMAL HIGH (ref 1.4–7.7)
Neutrophils Relative %: 76.4 % (ref 43.0–77.0)
RBC: 3.8 Mil/uL — ABNORMAL LOW (ref 4.22–5.81)
WBC: 13.4 10*3/uL — ABNORMAL HIGH (ref 4.5–10.5)

## 2012-03-10 NOTE — Progress Notes (Signed)
Subjective:    Patient ID: Austin [name], male    DOB: 05/02/1956, 56 y.o.   MRN: [id number]  HPI  Hospital followup. Patient had recent colonoscopy. Had about 15 polyps removed. This occured on Tuesday. By Friday he developed some hematochezia and right lower quadrant abdominal pain and slight nausea. Presented to hospital and was admitted. Hemoglobin was 10.9 at discharge and low of 10.5. Aspirin and Plavix were held and started back several days after discharge. Had no further bleeding. In process of workup he had CT scan which showed left renal mass suspicious for renal cell carcinoma. Has seen urologist. They're considering treatment options at this point.  Patient was not discharged on any iron. No orthostasis. No dyspnea. No chest pain.  Patient had A1C 7.3% during hospitalization. No symptoms of hyperglycemia. Never treated for diabetes. No home glucose monitor  Past Medical History  Diagnosis Date  . Hx of colonic polyps   . Hypertension   . Pituitary adenoma   . Urinary incontinence   . Venous stasis     edema  . 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 Hypogonadism   . Cerebrovascular accident 1994    hx of brainstem stroke, residual diminished lung capacity  . Neuromuscular disorder     quadraplegic   Past Surgical History  Procedure Date  . Tracheostomy tube placement 02/1993    removed 04/1993  . Stomach peg 02/1993    removed 5-6 years later  . Cholecystectomy   . Pituitary surgery 2007, 2011  . Gamma knife radiation surgery 05/2010    for pituitary  . Vocal cord surgery     injected with collagen, then fat from stomach to improve speech s/p stroke  . Colonoscopy 02/27/2012    Procedure: COLONOSCOPY;  Surgeon: Beverley Fiedler, MD;  Location: [facility];  Service: Gastroenterology;  Laterality: N/A;    reports that he quit smoking about 19 years ago. His smoking use included Cigarettes. He has a 11 pack-year smoking history. He does  not have any smokeless tobacco history on file. He reports that he drinks alcohol. His drug history not on file. family history includes Alcohol abuse in his fathers; Arthritis in his maternal grandmother and mother; Colon cancer in his brother; Esophageal cancer (age of onset:60) in his father; Hyperlipidemia in his mother; Hypertension in his brother; Stroke in his paternal grandmother; Throat cancer in his father; and Uterine cancer (age of onset:58) in his mother. Allergies  Allergen Reactions  . Penicillins     REACTION: rash      Review of Systems  Constitutional: Negative for fever, chills and unexpected weight change.  Respiratory: Negative for shortness of breath.   Cardiovascular: Negative for chest pain, palpitations and leg swelling.  Gastrointestinal: Negative for nausea, vomiting, abdominal pain, diarrhea and blood in stool.  Neurological: Negative for dizziness and syncope.       Objective:   Physical Exam  Constitutional: He appears well-developed and well-nourished.  Cardiovascular: Normal rate and regular rhythm.   Pulmonary/Chest: Effort normal and breath sounds normal. No respiratory distress. He has no wheezes. He has no rales.  Abdominal: Soft. There is no tenderness.  Musculoskeletal:       Trace edema legs bilaterally  Neurological: He is alert.          Assessment & Plan:  #1 recent acute blood loss secondary to bleeding from recent polypectomy. Clinically stable. Recheck CBC #2 type 2 diabetes. Recent A1c 7.3%. History  of prediabetes and based on A1C above very likely Type 2 diabetes. Home glucose monitor given with instructions to wife for use. Monitor over the next few weeks and follow up then to review and possible meds. #3 left renal mass. 4.2 cm hypervascular by CT probably renal cell cancer. Has already seen urologist. Patient and wife expressed interest in having this excised. There apparently been discussion of alternative therapy

## 2012-03-11 DIAGNOSIS — N2889 Other specified disorders of kidney and ureter: Secondary | ICD-10-CM | POA: Insufficient documentation

## 2012-03-11 NOTE — Progress Notes (Signed)
Quick Note:  Pt wife informed, iron rich diet and labs mailed to home ______

## 2012-03-16 ENCOUNTER — Other Ambulatory Visit: Payer: Self-pay | Admitting: Family Medicine

## 2012-03-16 MED ORDER — GLUCOSE BLOOD VI STRP: ORAL_STRIP | Status: DC

## 2012-03-16 NOTE — Telephone Encounter (Signed)
Pt needs freestyle test strips call into [facility]

## 2012-03-31 ENCOUNTER — Ambulatory Visit (INDEPENDENT_AMBULATORY_CARE_PROVIDER_SITE_OTHER): Payer: Medicare Other | Admitting: Family Medicine

## 2012-03-31 DIAGNOSIS — D62 Acute posthemorrhagic anemia: Secondary | ICD-10-CM

## 2012-03-31 DIAGNOSIS — N2889 Other specified disorders of kidney and ureter: Secondary | ICD-10-CM

## 2012-03-31 DIAGNOSIS — N289 Disorder of kidney and ureter, unspecified: Secondary | ICD-10-CM

## 2012-03-31 DIAGNOSIS — D649 Anemia, unspecified: Secondary | ICD-10-CM

## 2012-03-31 DIAGNOSIS — E119 Type 2 diabetes mellitus without complications: Secondary | ICD-10-CM | POA: Insufficient documentation

## 2012-03-31 MED ORDER — METFORMIN HCL 500 MG PO TABS: 500.0000 mg | ORAL_TABLET | Freq: Every day | ORAL | Status: DC

## 2012-03-31 NOTE — Patient Instructions (Signed)
Call in 2-3 weeks with blood sugar range. Our goal is < 130 fasting and < 180 2 hours postprandial

## 2012-03-31 NOTE — Progress Notes (Signed)
Subjective:    Patient ID: Austin [name], male    DOB: Jun 11, 1956, 56 y.o.   MRN: [id number]  HPI  Patient seen for followup. Recent anemia following: Polypectomy. Patient was admitted and CT scan revealed left renal mass. He has followup with urologist this Monday. Has already seen oncologist. They are not recommending any treatment at this time from oncology perspective.  Patient had anemia following recent bleed. No dizziness. No dyspnea. Iron rich diet but not taking any iron supplementation. We plan repeat CBC today.  Recent diagnosis diabetes. A1c 7.3%. Home monitor given last visit. Fairly consistent fasting blood sugars around 165-200 range. Patient currently not taking any diabetic medications. No increased thirst.  Past Medical History  Diagnosis Date  . Hx of colonic polyps   . Hypertension   . Pituitary adenoma   . Urinary incontinence   . Venous stasis     edema  . 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 Hypogonadism   . Cerebrovascular accident 1994    hx of brainstem stroke, residual diminished lung capacity  . Neuromuscular disorder     quadraplegic   Past Surgical History  Procedure Date  . Tracheostomy tube placement 02/1993    removed 04/1993  . Stomach peg 02/1993    removed 5-6 years later  . Cholecystectomy   . Pituitary surgery 2007, 2011  . Gamma knife radiation surgery 05/2010    for pituitary  . Vocal cord surgery     injected with collagen, then fat from stomach to improve speech s/p stroke  . Colonoscopy 02/27/2012    Procedure: COLONOSCOPY;  Surgeon: Beverley Fiedler, MD;  Location: [facility];  Service: Gastroenterology;  Laterality: N/A;    reports that he quit smoking about 19 years ago. His smoking use included Cigarettes. He has a 11 pack-year smoking history. He does not have any smokeless tobacco history on file. He reports that he drinks alcohol. His drug history not on file. family history includes Alcohol  abuse in his fathers; Arthritis in his maternal grandmother and mother; Colon cancer in his brother; Esophageal cancer (age of onset:60) in his father; Hyperlipidemia in his mother; Hypertension in his brother; Stroke in his paternal grandmother; Throat cancer in his father; and Uterine cancer (age of onset:58) in his mother. Allergies  Allergen Reactions  . Penicillins     REACTION: rash      Review of Systems  Constitutional: Negative for fatigue.  Eyes: Negative for visual disturbance.  Respiratory: Negative for cough, chest tightness and shortness of breath.   Cardiovascular: Positive for leg swelling (Chronic and unchanged). Negative for chest pain and palpitations.  Neurological: Negative for dizziness, syncope, weakness, light-headedness and headaches.       Objective:   Physical Exam  Constitutional: He is oriented to person, place, and time. He appears well-developed and well-nourished.  Cardiovascular: Normal rate and regular rhythm.   Pulmonary/Chest: Effort normal and breath sounds normal. No respiratory distress. He has no wheezes. He has no rales.  Musculoskeletal: He exhibits edema.       1+ edema lower legs which is chronic and unchanged  Neurological: He is alert and oriented to person, place, and time.          Assessment & Plan:  #1 anemia following recent acute GI loss from colon polyp excision. Recheck CBC #2 type 2 diabetes. Recent A1c 7.3%. Fasting blood sugars mostly around 200. Initiate metformin 500 mg once daily. He is normal  creatinine. Continue close monitoring and followup 2 months and reassess A1c then #3 left renal mass. Probable renal cell carcinoma. Patient is very interested in nephrectomy versus cryotherapy. We explained this decision obviously will have to be deferred to urologist

## 2012-04-01 LAB — CBC WITH DIFFERENTIAL/PLATELET
Basophils Absolute: 0.2 10*3/uL — ABNORMAL HIGH (ref 0.0–0.1)
Basophils Relative: 1.2 % (ref 0.0–3.0)
Eosinophils Absolute: 0.3 10*3/uL (ref 0.0–0.7)
Lymphocytes Relative: 10.1 % — ABNORMAL LOW (ref 12.0–46.0)
MCHC: 31.9 g/dL (ref 30.0–36.0)
MCV: 80.4 fl (ref 78.0–100.0)
Monocytes Absolute: 1.1 10*3/uL — ABNORMAL HIGH (ref 0.1–1.0)
Neutrophils Relative %: 78.9 % — ABNORMAL HIGH (ref 43.0–77.0)
RBC: 4.15 Mil/uL — ABNORMAL LOW (ref 4.22–5.81)
RDW: 19.8 % — ABNORMAL HIGH (ref 11.5–14.6)

## 2012-04-06 ENCOUNTER — Other Ambulatory Visit: Payer: Self-pay | Admitting: Urology

## 2012-04-08 ENCOUNTER — Encounter (HOSPITAL_COMMUNITY): Payer: Self-pay | Admitting: Pharmacy Technician

## 2012-04-16 NOTE — Pre-Procedure Instructions (Signed)
ekg 02-27-2012 epic

## 2012-04-19 ENCOUNTER — Ambulatory Visit (HOSPITAL_COMMUNITY)
Admission: RE | Admit: 2012-04-19 | Discharge: 2012-04-19 | Disposition: A | Discharge status: Home / Self Care | Payer: Medicare Other | Source: Ambulatory Visit | Attending: Urology | Admitting: Urology

## 2012-04-19 ENCOUNTER — Encounter (HOSPITAL_COMMUNITY)
Admission: RE | Admit: 2012-04-19 | Discharge: 2012-04-19 | Disposition: A | Discharge status: Home / Self Care | Payer: Medicare Other | Source: Ambulatory Visit | Attending: Urology | Admitting: Urology

## 2012-04-19 ENCOUNTER — Encounter (HOSPITAL_COMMUNITY): Payer: Self-pay

## 2012-04-19 DIAGNOSIS — N289 Disorder of kidney and ureter, unspecified: Secondary | ICD-10-CM | POA: Insufficient documentation

## 2012-04-19 DIAGNOSIS — Z01812 Encounter for preprocedural laboratory examination: Secondary | ICD-10-CM | POA: Insufficient documentation

## 2012-04-19 HISTORY — DX: Chronic kidney disease, unspecified: N18.9

## 2012-04-19 LAB — SURGICAL PCR SCREEN: Staphylococcus aureus: POSITIVE — AB

## 2012-04-19 LAB — CBC
HCT: 38.8 % — ABNORMAL LOW (ref 39.0–52.0)
Hemoglobin: 11.9 g/dL — ABNORMAL LOW (ref 13.0–17.0)
MCH: 24.1 pg — ABNORMAL LOW (ref 26.0–34.0)
MCHC: 30.7 g/dL (ref 30.0–36.0)

## 2012-04-19 LAB — BASIC METABOLIC PANEL
BUN: 9 mg/dL (ref 6–23)
CO2: 26 mEq/L (ref 19–32)
Chloride: 98 mEq/L (ref 96–112)
Glucose, Bld: 177 mg/dL — ABNORMAL HIGH (ref 70–99)
Potassium: 3.8 mEq/L (ref 3.5–5.1)

## 2012-04-19 NOTE — Patient Instructions (Addendum)
20 Austin [name]  04/19/2012   Your procedure is scheduled on:  04-23-12  Report to [facility] at  AM.0515  Call this number if you have problems the morning of surgery: [phone]   Remember:  Do not eat food:After Midnight.    No food after Wednesday night per office for bowel prep Thursday   May have clear liquids:until Midnight 04-22-12  Increase fluids with bowel prep  Clear liquids include soda, tea, black coffee, apple or grape juice, broth.  Take these medicines the morning of surgery with A SIP OF WATER: None   Do not wear jewelry, make-up or nail polish.  Do not wear lotions, powders, or perfumes. You may wear deodorant.  Do not shave 48 hours prior to surgery. Men may shave face and neck.  Do not bring valuables to the hospital.  Contacts, dentures or bridgework may not be worn into surgery.  Leave suitcase in the car. After surgery it may be brought to your room.  For patients admitted to the hospital, checkout time is 11 AM the day of discharge.   Patients discharged the day of surgery will not be allowed to drive home.  Name and phone number of your driver:   Special Instructions: CHG Shower Use Special Wash: 1/2 bottle night before surgery and 1/2 bottle morning of surgery.   Please read over the following fact sheets that you were given: MRSA Information,IS instructions given and Blood transfusions given

## 2012-04-19 NOTE — Progress Notes (Signed)
04/19/12 1300  OBSTRUCTIVE SLEEP APNEA  Score 4 or greater  Updated health history;Results sent to PCP

## 2012-04-22 ENCOUNTER — Encounter (HOSPITAL_COMMUNITY): Payer: Self-pay | Admitting: *Deleted

## 2012-04-23 ENCOUNTER — Inpatient Hospital Stay (HOSPITAL_COMMUNITY)
Admission: RE | Admit: 2012-04-23 | Discharge: 2012-04-26 | DRG: 656 | Disposition: A | Discharge status: Home / Self Care | Payer: Medicare Other | Source: Ambulatory Visit | Attending: Urology | Admitting: Urology

## 2012-04-23 ENCOUNTER — Ambulatory Visit (HOSPITAL_COMMUNITY): Payer: Medicare Other | Admitting: Anesthesiology

## 2012-04-23 ENCOUNTER — Encounter (HOSPITAL_COMMUNITY)
Admission: RE | Disposition: A | Discharge status: Home / Self Care | Payer: Self-pay | Source: Ambulatory Visit | Attending: Urology

## 2012-04-23 ENCOUNTER — Encounter (HOSPITAL_COMMUNITY): Payer: Self-pay | Admitting: Anesthesiology

## 2012-04-23 ENCOUNTER — Encounter (HOSPITAL_COMMUNITY): Payer: Self-pay | Admitting: *Deleted

## 2012-04-23 DIAGNOSIS — I2510 Atherosclerotic heart disease of native coronary artery without angina pectoris: Secondary | ICD-10-CM | POA: Diagnosis present

## 2012-04-23 DIAGNOSIS — K429 Umbilical hernia without obstruction or gangrene: Secondary | ICD-10-CM | POA: Diagnosis present

## 2012-04-23 DIAGNOSIS — Z993 Dependence on wheelchair: Secondary | ICD-10-CM

## 2012-04-23 DIAGNOSIS — Z9861 Coronary angioplasty status: Secondary | ICD-10-CM

## 2012-04-23 DIAGNOSIS — G8250 Quadriplegia, unspecified: Secondary | ICD-10-CM | POA: Diagnosis present

## 2012-04-23 DIAGNOSIS — E119 Type 2 diabetes mellitus without complications: Secondary | ICD-10-CM | POA: Diagnosis present

## 2012-04-23 DIAGNOSIS — I69965 Other paralytic syndrome following unspecified cerebrovascular disease, bilateral: Secondary | ICD-10-CM

## 2012-04-23 DIAGNOSIS — C649 Malignant neoplasm of unspecified kidney, except renal pelvis: Principal | ICD-10-CM | POA: Diagnosis present

## 2012-04-23 DIAGNOSIS — I252 Old myocardial infarction: Secondary | ICD-10-CM

## 2012-04-23 HISTORY — PX: UMBILICAL HERNIA REPAIR: SHX196

## 2012-04-23 HISTORY — PX: ROBOT ASSISTED LAPAROSCOPIC NEPHRECTOMY: SHX5140

## 2012-04-23 LAB — CBC
Hemoglobin: 11.1 g/dL — ABNORMAL LOW (ref 13.0–17.0)
MCHC: 30.9 g/dL (ref 30.0–36.0)
RDW: 18.7 % — ABNORMAL HIGH (ref 11.5–15.5)
WBC: 17.8 10*3/uL — ABNORMAL HIGH (ref 4.0–10.5)

## 2012-04-23 LAB — GLUCOSE, CAPILLARY
Glucose-Capillary: 122 mg/dL — ABNORMAL HIGH (ref 70–99)
Glucose-Capillary: 183 mg/dL — ABNORMAL HIGH (ref 70–99)

## 2012-04-23 LAB — CREATININE, SERUM: GFR calc Af Amer: >90 mL/min (ref >90)

## 2012-04-23 LAB — TYPE AND SCREEN

## 2012-04-23 SURGERY — ROBOTIC ASSISTED LAPAROSCOPIC NEPHRECTOMY
Anesthesia: General | Laterality: Left | Wound class: Clean Contaminated

## 2012-04-23 MED ORDER — ATORVASTATIN CALCIUM 20 MG PO TABS
20.0000 mg | ORAL_TABLET | Freq: Every day | ORAL | Status: DC
  Administered 2012-04-24 – 2012-04-26 (×3): 20 mg via ORAL
  Filled 2012-04-23 (×4): qty 1

## 2012-04-23 MED ORDER — NEOSTIGMINE METHYLSULFATE 1 MG/ML IJ SOLN
INTRAMUSCULAR | Status: DC | PRN
  Administered 2012-04-23: 4 mg via INTRAVENOUS

## 2012-04-23 MED ORDER — CIPROFLOXACIN IN D5W 400 MG/200ML IV SOLN
INTRAVENOUS | Status: AC
  Filled 2012-04-23: qty 200

## 2012-04-23 MED ORDER — LIDOCAINE HCL (CARDIAC) 20 MG/ML IV SOLN
INTRAVENOUS | Status: DC | PRN
  Administered 2012-04-23: 100 mg via INTRAVENOUS

## 2012-04-23 MED ORDER — OXYCODONE HCL 5 MG/5ML PO SOLN
5.0000 mg | Freq: Once | ORAL | Status: DC | PRN
  Filled 2012-04-23: qty 5

## 2012-04-23 MED ORDER — MEPERIDINE HCL 50 MG/ML IJ SOLN: 6.2500 mg | INTRAMUSCULAR | Status: DC | PRN

## 2012-04-23 MED ORDER — MIDAZOLAM HCL 5 MG/5ML IJ SOLN
INTRAMUSCULAR | Status: DC | PRN
  Administered 2012-04-23: 2 mg via INTRAVENOUS

## 2012-04-23 MED ORDER — CLINDAMYCIN PHOSPHATE 600 MG/50ML IV SOLN
600.0000 mg | Freq: Once | INTRAVENOUS | Status: AC
  Administered 2012-04-23: 600 mg via INTRAVENOUS
  Filled 2012-04-23: qty 50

## 2012-04-23 MED ORDER — PROMETHAZINE HCL 25 MG/ML IJ SOLN: 6.2500 mg | INTRAMUSCULAR | Status: DC | PRN

## 2012-04-23 MED ORDER — ACETAMINOPHEN 10 MG/ML IV SOLN
INTRAVENOUS | Status: DC | PRN
  Administered 2012-04-23: 1000 mg via INTRAVENOUS

## 2012-04-23 MED ORDER — CIPROFLOXACIN IN D5W 400 MG/200ML IV SOLN
400.0000 mg | INTRAVENOUS | Status: AC
  Administered 2012-04-23: 400 mg via INTRAVENOUS

## 2012-04-23 MED ORDER — GLYCOPYRROLATE 0.2 MG/ML IJ SOLN
INTRAMUSCULAR | Status: DC | PRN
  Administered 2012-04-23: .6 mg via INTRAVENOUS

## 2012-04-23 MED ORDER — ESCITALOPRAM OXALATE 10 MG PO TABS
10.0000 mg | ORAL_TABLET | Freq: Every day | ORAL | Status: DC
  Administered 2012-04-24 – 2012-04-26 (×3): 10 mg via ORAL
  Filled 2012-04-23 (×4): qty 1

## 2012-04-23 MED ORDER — SODIUM CHLORIDE 0.9 % IV SOLN
INTRAVENOUS | Status: DC
  Administered 2012-04-23 – 2012-04-25 (×4): via INTRAVENOUS

## 2012-04-23 MED ORDER — BUPIVACAINE HCL (PF) 0.25 % IJ SOLN
INTRAMUSCULAR | Status: DC | PRN
  Administered 2012-04-23: 28 mL

## 2012-04-23 MED ORDER — ACETAMINOPHEN 10 MG/ML IV SOLN
INTRAVENOUS | Status: AC
  Filled 2012-04-23: qty 100

## 2012-04-23 MED ORDER — DEXAMETHASONE SODIUM PHOSPHATE 10 MG/ML IJ SOLN
INTRAMUSCULAR | Status: DC | PRN
  Administered 2012-04-23: 10 mg via INTRAVENOUS

## 2012-04-23 MED ORDER — LISINOPRIL 10 MG PO TABS
10.0000 mg | ORAL_TABLET | Freq: Every day | ORAL | Status: DC
  Administered 2012-04-24 – 2012-04-26 (×3): 10 mg via ORAL
  Filled 2012-04-23 (×3): qty 1

## 2012-04-23 MED ORDER — OXYCODONE-ACETAMINOPHEN 5-325 MG PO TABS
1.0000 | ORAL_TABLET | ORAL | Status: DC | PRN
  Administered 2012-04-23 – 2012-04-24 (×3): 2 via ORAL
  Administered 2012-04-24 – 2012-04-25 (×4): 1 via ORAL
  Filled 2012-04-23: qty 1
  Filled 2012-04-23: qty 2
  Filled 2012-04-23 (×2): qty 1
  Filled 2012-04-23: qty 2
  Filled 2012-04-23: qty 1
  Filled 2012-04-23 (×2): qty 2

## 2012-04-23 MED ORDER — FERROUS SULFATE 325 (65 FE) MG PO TABS
325.0000 mg | ORAL_TABLET | Freq: Every day | ORAL | Status: DC
  Administered 2012-04-24 – 2012-04-26 (×3): 325 mg via ORAL
  Filled 2012-04-23 (×4): qty 1

## 2012-04-23 MED ORDER — BUPIVACAINE HCL (PF) 0.25 % IJ SOLN
INTRAMUSCULAR | Status: AC
  Filled 2012-04-23: qty 30

## 2012-04-23 MED ORDER — CISATRACURIUM BESYLATE (PF) 10 MG/5ML IV SOLN
INTRAVENOUS | Status: DC | PRN
  Administered 2012-04-23: 2 mg via INTRAVENOUS
  Administered 2012-04-23: 4 mg via INTRAVENOUS
  Administered 2012-04-23: 16 mg via INTRAVENOUS
  Administered 2012-04-23: 2 mg via INTRAVENOUS

## 2012-04-23 MED ORDER — PROPOFOL 10 MG/ML IV BOLUS
INTRAVENOUS | Status: DC | PRN
  Administered 2012-04-23: 180 mg via INTRAVENOUS

## 2012-04-23 MED ORDER — STERILE WATER FOR IRRIGATION IR SOLN
Status: DC | PRN
  Administered 2012-04-23: 3000 mL

## 2012-04-23 MED ORDER — SENNOSIDES-DOCUSATE SODIUM 8.6-50 MG PO TABS
2.0000 | ORAL_TABLET | Freq: Every day | ORAL | Status: DC
  Administered 2012-04-23 – 2012-04-25 (×3): 2 via ORAL
  Filled 2012-04-23 (×4): qty 2

## 2012-04-23 MED ORDER — OXYCODONE HCL 5 MG PO TABS: 5.0000 mg | ORAL_TABLET | Freq: Once | ORAL | Status: DC | PRN

## 2012-04-23 MED ORDER — SUFENTANIL CITRATE 50 MCG/ML IV SOLN
INTRAVENOUS | Status: DC | PRN
  Administered 2012-04-23: 15 ug via INTRAVENOUS
  Administered 2012-04-23 (×5): 10 ug via INTRAVENOUS
  Administered 2012-04-23: 15 ug via INTRAVENOUS

## 2012-04-23 MED ORDER — HYDROMORPHONE HCL PF 1 MG/ML IJ SOLN
INTRAMUSCULAR | Status: AC
  Filled 2012-04-23: qty 1

## 2012-04-23 MED ORDER — ACETAMINOPHEN 10 MG/ML IV SOLN: 1000.0000 mg | Freq: Once | INTRAVENOUS | Status: DC | PRN

## 2012-04-23 MED ORDER — LISINOPRIL-HYDROCHLOROTHIAZIDE 10-12.5 MG PO TABS: 1.0000 | ORAL_TABLET | Freq: Every day | ORAL | Status: DC

## 2012-04-23 MED ORDER — HYDROMORPHONE HCL PF 1 MG/ML IJ SOLN
0.5000 mg | INTRAMUSCULAR | Status: DC | PRN
  Administered 2012-04-23: 0.5 mg via INTRAVENOUS
  Administered 2012-04-23: 1 mg via INTRAVENOUS
  Filled 2012-04-23: qty 1

## 2012-04-23 MED ORDER — HYDROCHLOROTHIAZIDE 12.5 MG PO CAPS
12.5000 mg | ORAL_CAPSULE | Freq: Every day | ORAL | Status: DC
  Administered 2012-04-24 – 2012-04-26 (×3): 12.5 mg via ORAL
  Filled 2012-04-23 (×3): qty 1

## 2012-04-23 MED ORDER — METFORMIN HCL 500 MG PO TABS
500.0000 mg | ORAL_TABLET | Freq: Every day | ORAL | Status: DC
  Administered 2012-04-23 – 2012-04-25 (×3): 500 mg via ORAL
  Filled 2012-04-23 (×4): qty 1

## 2012-04-23 MED ORDER — OXYBUTYNIN CHLORIDE 5 MG PO TABS
5.0000 mg | ORAL_TABLET | Freq: Two times a day (BID) | ORAL | Status: DC
  Administered 2012-04-23 – 2012-04-26 (×7): 5 mg via ORAL
  Filled 2012-04-23 (×8): qty 1

## 2012-04-23 MED ORDER — HYDROMORPHONE HCL PF 1 MG/ML IJ SOLN: 0.2500 mg | INTRAMUSCULAR | Status: DC | PRN

## 2012-04-23 MED ORDER — TRAZODONE HCL 100 MG PO TABS
100.0000 mg | ORAL_TABLET | Freq: Every day | ORAL | Status: DC
  Administered 2012-04-23 – 2012-04-25 (×3): 100 mg via ORAL
  Filled 2012-04-23 (×4): qty 1

## 2012-04-23 MED ORDER — MANNITOL 25 % IV SOLN
25.0000 g | Freq: Once | INTRAVENOUS | Status: DC
  Filled 2012-04-23: qty 100

## 2012-04-23 MED ORDER — METFORMIN HCL 500 MG PO TABS
500.0000 mg | ORAL_TABLET | Freq: Every day | ORAL | Status: DC
  Filled 2012-04-23: qty 1

## 2012-04-23 MED ORDER — LACTATED RINGERS IV SOLN
INTRAVENOUS | Status: DC | PRN
  Administered 2012-04-23 (×2): via INTRAVENOUS

## 2012-04-23 MED ORDER — LACTATED RINGERS IR SOLN
Status: DC | PRN
  Administered 2012-04-23: 1000 mL

## 2012-04-23 MED ORDER — ONDANSETRON HCL 4 MG/2ML IJ SOLN
INTRAMUSCULAR | Status: DC | PRN
  Administered 2012-04-23: 4 mg via INTRAVENOUS

## 2012-04-23 MED ORDER — LACTATED RINGERS IV SOLN
INTRAVENOUS | Status: DC
  Administered 2012-04-23: 14:00:00 via INTRAVENOUS

## 2012-04-23 MED ORDER — ENOXAPARIN SODIUM 30 MG/0.3ML ~~LOC~~ SOLN
30.0000 mg | SUBCUTANEOUS | Status: DC
  Administered 2012-04-23 – 2012-04-25 (×3): 30 mg via SUBCUTANEOUS
  Filled 2012-04-23 (×4): qty 0.3

## 2012-04-23 SURGICAL SUPPLY — 69 items
APPLICATOR SURGIFLO ENDO (HEMOSTASIS) IMPLANT
CANNULA SEAL DVNC (CANNULA) IMPLANT
CANNULA SEALS DA VINCI (CANNULA)
CHLORAPREP W/TINT 26ML (MISCELLANEOUS) ×3 IMPLANT
CLIP LIGATING HEM O LOK PURPLE (MISCELLANEOUS) ×6 IMPLANT
CLIP LIGATING HEMO LOK XL GOLD (MISCELLANEOUS) ×3 IMPLANT
CLIP LIGATING HEMO O LOK GREEN (MISCELLANEOUS) ×3 IMPLANT
CLOTH BEACON ORANGE TIMEOUT ST (SAFETY) ×3 IMPLANT
CORDS BIPOLAR (ELECTRODE) ×3 IMPLANT
COVER SURGICAL LIGHT HANDLE (MISCELLANEOUS) ×3 IMPLANT
COVER TIP SHEARS 8 DVNC (MISCELLANEOUS) ×2 IMPLANT
COVER TIP SHEARS 8MM DA VINCI (MISCELLANEOUS) ×1
CUTTER FLEX LINEAR 45M (STAPLE) ×3 IMPLANT
DECANTER SPIKE VIAL GLASS SM (MISCELLANEOUS) ×3 IMPLANT
DERMABOND ADVANCED (GAUZE/BANDAGES/DRESSINGS) ×2
DERMABOND ADVANCED .7 DNX12 (GAUZE/BANDAGES/DRESSINGS) ×4 IMPLANT
DRAIN CHANNEL 15F RND FF 3/16 (WOUND CARE) ×3 IMPLANT
DRAPE INCISE IOBAN 66X45 STRL (DRAPES) ×3 IMPLANT
DRAPE LAPAROSCOPIC ABDOMINAL (DRAPES) ×3 IMPLANT
DRAPE LG THREE QUARTER DISP (DRAPES) ×6 IMPLANT
DRAPE TABLE BACK 44X90 PK DISP (DRAPES) ×3 IMPLANT
DRAPE WARM FLUID 44X44 (DRAPE) ×3 IMPLANT
DRESSING SURGICEL FIBRLLR 1X2 (HEMOSTASIS) IMPLANT
DRSG SURGICEL FIBRILLAR 1X2 (HEMOSTASIS)
ELECT REM PT RETURN 9FT ADLT (ELECTROSURGICAL) ×3
ELECTRODE REM PT RTRN 9FT ADLT (ELECTROSURGICAL) ×2 IMPLANT
EVACUATOR SILICONE 100CC (DRAIN) ×3 IMPLANT
GAUZE VASELINE 3X9 (GAUZE/BANDAGES/DRESSINGS) IMPLANT
GLOVE BIO SURGEON STRL SZ 6.5 (GLOVE) ×3 IMPLANT
GLOVE BIOGEL M STRL SZ7.5 (GLOVE) ×6 IMPLANT
GOWN STRL NON-REIN LRG LVL3 (GOWN DISPOSABLE) ×9 IMPLANT
GOWN STRL REIN XL XLG (GOWN DISPOSABLE) ×3 IMPLANT
HEMOSTAT SURGICEL 4X8 (HEMOSTASIS) IMPLANT
KIT ACCESSORY DA VINCI DISP (KITS) ×1
KIT ACCESSORY DVNC DISP (KITS) ×2 IMPLANT
KIT BASIN OR (CUSTOM PROCEDURE TRAY) ×3 IMPLANT
LOOP VESSEL MAXI BLUE (MISCELLANEOUS) ×3 IMPLANT
NEEDLE INSUFFLATION 14GA 120MM (NEEDLE) ×3 IMPLANT
NS IRRIG 1000ML POUR BTL (IV SOLUTION) IMPLANT
PENCIL BUTTON HOLSTER BLD 10FT (ELECTRODE) ×3 IMPLANT
POSITIONER SURGICAL ARM (MISCELLANEOUS) IMPLANT
POUCH ENDO CATCH II 15MM (MISCELLANEOUS) ×3 IMPLANT
POUCH SPECIMEN RETRIEVAL 10MM (ENDOMECHANICALS) ×3 IMPLANT
RELOAD 45 VASCULAR/THIN (ENDOMECHANICALS) ×9 IMPLANT
SET TUBE IRRIG SUCTION NO TIP (IRRIGATION / IRRIGATOR) ×3 IMPLANT
SOLUTION ANTI FOG 6CC (MISCELLANEOUS) ×3 IMPLANT
SOLUTION ELECTROLUBE (MISCELLANEOUS) ×3 IMPLANT
SPONGE LAP 18X18 X RAY DECT (DISPOSABLE) ×3 IMPLANT
SPONGE LAP 4X18 X RAY DECT (DISPOSABLE) ×3 IMPLANT
SURGIFLO W/THROMBIN 8M KIT (HEMOSTASIS) IMPLANT
SUT ETHILON 3 0 PS 1 (SUTURE) ×3 IMPLANT
SUT MNCRL AB 4-0 PS2 18 (SUTURE) ×6 IMPLANT
SUT PDS AB 1 TP1 54 (SUTURE) ×6 IMPLANT
SUT PDS AB 1 TP1 96 (SUTURE) ×6 IMPLANT
SUT V-LOC BARB 180 2/0GR6 GS22 (SUTURE) ×6
SUT V-LOC BARB 180 2/0GR9 GS23 (SUTURE)
SUT VIC AB 0 CT1 27 (SUTURE) ×1
SUT VIC AB 0 CT1 27XBRD ANTBC (SUTURE) ×2 IMPLANT
SUT VICRYL 0 UR6 27IN ABS (SUTURE) ×6 IMPLANT
SUTURE V-LC BRB 180 2/0GR6GS22 (SUTURE) ×4 IMPLANT
SUTURE V-LC BRB 180 2/0GR9GS23 (SUTURE) IMPLANT
SYR BULB IRRIGATION 50ML (SYRINGE) IMPLANT
TOWEL OR NON WOVEN STRL DISP B (DISPOSABLE) ×3 IMPLANT
TRAY FOLEY CATH 14FRSI W/METER (CATHETERS) ×3 IMPLANT
TRAY LAP CHOLE (CUSTOM PROCEDURE TRAY) ×3 IMPLANT
TROCAR ENDOPATH XCEL 12X100 BL (ENDOMECHANICALS) ×3 IMPLANT
TROCAR XCEL 12X100 BLDLESS (ENDOMECHANICALS) ×3 IMPLANT
TUBING INSUFFLATION 10FT LAP (TUBING) ×3 IMPLANT
WATER STERILE IRR 1500ML POUR (IV SOLUTION) ×3 IMPLANT

## 2012-04-23 NOTE — Addendum Note (Signed)
Addendum  created 04/23/12 1436 by Florene Route, CRNA   Modules edited:Anesthesia Medication Administration

## 2012-04-23 NOTE — Anesthesia Procedure Notes (Signed)
Procedure Name: Intubation Date/Time: 04/23/2012 7:48 AM Performed by: Leroy Libman L Patient Re-evaluated:Patient Re-evaluated prior to inductionOxygen Delivery Method: Circle system utilized Preoxygenation: Pre-oxygenation with 100% oxygen Intubation Type: IV induction Ventilation: Mask ventilation without difficulty and Oral airway inserted - appropriate to patient size Laryngoscope Size: Miller and 3 Grade View: Grade I Tube type: Oral Tube size: 7.5 mm Number of attempts: 1 Airway Equipment and Method: Stylet Placement Confirmation: ETT inserted through vocal cords under direct vision,  breath sounds checked- equal and bilateral and positive ETCO2 Secured at: 23 cm Tube secured with: Tape Dental Injury: Teeth and Oropharynx as per pre-operative assessment  Comments: No tracheal stenosis noted.  Tube passed without resistence

## 2012-04-23 NOTE — H&P (Signed)
Austin [name] is an 56 y.o. male.   Chief Complaint: pre-op Left Partial Nephrectomy HPI:  1 - Left Renal Mass - 4.2 cm left renal mass. STaging imaging and labs with local disease only. Pt has discussed with several urologists as well as interventional radiologist treatemnt strategeies and he has elected robotic partial v. Radical nephrectomy.  PMH sig for stroke in 1994, manages bladder with condom cath, in wheelchair at baseline. No CV disease.   Trach / PEG in distant past, now without for many years.   Past Medical History  Diagnosis Date  . Hx of colonic polyps   . Hypertension   . Pituitary adenoma   . Urinary incontinence   . Venous stasis     edema  . 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 Hypogonadism   . Neuromuscular disorder     quadraplegic  . Chronic kidney disease     Left Renal Mass  . Sleep apnea     stopbang=4  . Cerebrovascular accident 34    hx of brainstem stroke, residual diminished lung capacity    Past Surgical History  Procedure Date  . Tracheostomy tube placement 02/1993    removed 04/1993  . Stomach peg 02/1993    removed 5-6 years later  . Cholecystectomy   . Pituitary surgery 2007, 2011  . Gamma knife radiation surgery 05/2010    for pituitary  . Colonoscopy 02/27/2012    Procedure: COLONOSCOPY;  Surgeon: Beverley Fiedler, MD;  Location: [facility];  Service: Gastroenterology;  Laterality: N/A;  . Vocal cord surgery     injected with collagen, then fat from stomach to improve speech s/p stroke  . Vasectomy 1984  . Robotic partial nephrectomy 04-23-12    left   Left Renal Mass    Family History  Problem Relation Age of Onset  . Alcohol abuse Father   . Arthritis Mother   . Colon cancer Brother   . Arthritis Maternal Grandmother   . Hyperlipidemia Mother   . Hypertension Brother   . Stroke Paternal Grandmother   . Throat cancer Father   . Esophageal cancer Father 35  . Alcohol abuse Father   .  Uterine cancer Mother 50   Social History:  reports that he quit smoking about 19 years ago. His smoking use included Cigarettes. He has a 11.5 pack-year smoking history. He has never used smokeless tobacco. He reports that he does not drink alcohol or use illicit drugs.  Allergies:  Allergies  Allergen Reactions  . Penicillins     REACTION: rash    Medications Prior to Admission  Medication Sig Dispense Refill  . aspirin 325 MG tablet Take 325 mg by mouth every morning.       Marland Kitchen atorvastatin (LIPITOR) 20 MG tablet Take 20 mg by mouth daily before breakfast.      . Cranberry-Vitamin C-Vitamin E 4200-20-3 MG-MG-UNIT CAPS Take 1 capsule by mouth daily.      Marland Kitchen escitalopram (LEXAPRO) 10 MG tablet Take 10 mg by mouth daily before breakfast.      . fish oil-omega-3 fatty acids 1000 MG capsule Take 2 g by mouth daily.      Marland Kitchen ketoconazole (NIZORAL) 2 % cream Apply 1 application topically as needed. Rash      . lisinopril-hydrochlorothiazide (PRINZIDE,ZESTORETIC) 10-12.5 MG per tablet Take 1 tablet by mouth daily before breakfast.      . metFORMIN (GLUCOPHAGE) 500 MG tablet Take 500 mg by mouth  daily with lunch.      . Multiple Vitamins-Minerals (CENTRUM SILVER PO) Take 1 tablet by mouth every morning.       . nitrofurantoin, macrocrystal-monohydrate, (MACROBID) 100 MG capsule Take 100 mg by mouth every evening.       Marland Kitchen oxybutynin (DITROPAN) 5 MG tablet Take 5 mg by mouth 2 (two) times daily.      Marland Kitchen testosterone cypionate (DEPOTESTOTERONE CYPIONATE) 200 MG/ML injection Inject 200 mg into the muscle every 14 (fourteen) days. tuesday      . traZODone (DESYREL) 100 MG tablet Take 100 mg by mouth at bedtime.      . triamcinolone cream (KENALOG) 0.1 % Apply 1 application topically as needed. Dry skin on face      . clopidogrel (PLAVIX) 75 MG tablet Take 75 mg by mouth at bedtime.      . ferrous sulfate 325 (65 FE) MG tablet Take 325 mg by mouth daily with breakfast.      . metFORMIN (GLUCOPHAGE) 500 MG  tablet Take 1 tablet (500 mg total) by mouth daily with breakfast.  30 tablet  6    Results for orders placed during the hospital encounter of 04/23/12 (from the past 48 hour(s))  GLUCOSE, CAPILLARY     Status: Abnormal   Collection Time   04/23/12  6:07 AM      Component Value Range Comment   Glucose-Capillary 122 (*) 70 - 99 mg/dL    Comment 1 Documented in Chart      No results found.  Review of Systems  Constitutional: Negative.   HENT: Negative.   Eyes: Negative.   Respiratory: Negative.   Cardiovascular: Negative.   Gastrointestinal: Negative.   Genitourinary: Negative.   Musculoskeletal: Negative.   Skin: Negative.   Neurological: Negative.   Endo/Heme/Allergies: Negative.   Psychiatric/Behavioral: Negative.     Blood pressure 131/59, pulse 65, temperature 98.1 F (36.7 C), temperature source Oral, resp. rate 20, SpO2 98.00%. Physical Exam  Constitutional: He appears well-developed and well-nourished.       In wheelchair as per baseline  HENT:  Head: Normocephalic and atraumatic.  Eyes: Pupils are equal, round, and reactive to light.  Neck: Normal range of motion. Neck supple.  Cardiovascular: Normal rate.   Respiratory: Effort normal.  GI: Soft. Bowel sounds are normal.       Small umbil hernia  Genitourinary: Penis normal.  Musculoskeletal: Normal range of motion.  Neurological: He is alert.  Skin: Skin is warm and dry.  Psychiatric: He has a normal mood and affect. His behavior is normal. Judgment and thought content normal.    CT - 4.2 cm left mass, 1 artery, 1 vein anatomy  Assessment/Plan 1 - Left Renal Mass - we have previously discussed risks and benefits of surgery in detail and he wishes to proceed. He understands that we may convert to radical nephrectomy or even open approach based on intra-operative findings. He also understands that with his h/o stroke he may have a longer post-op course than most patients.   Cristalle Rohm 04/23/2012, 6:24  AM

## 2012-04-23 NOTE — Brief Op Note (Signed)
04/23/2012  11:37 AM  PATIENT:  Austin [name]  56 y.o. male  PRE-OPERATIVE DIAGNOSIS:  LEFT RENAL MASS  POST-OPERATIVE DIAGNOSIS:  LEFT RENAL MASS  PROCEDURE:  Procedure(s) (LRB) with comments: ROBOTIC ASSISTED LAPAROSCOPIC NEPHRECTOMY (Left) - radical HERNIA REPAIR UMBILICAL ADULT ()  SURGEON:  Surgeon(s) and Role:    * Sebastian Ache, MD - Primary  PHYSICIAN ASSISTANT:   ASSISTANTS: Lujean Rave, PA   ANESTHESIA:   general  EBL:  Total I/O In: 2000 [I.V.:2000] Out: 550 [Urine:150; Blood:400]  BLOOD ADMINISTERED:none  DRAINS: (LLQ) Jackson-Pratt drain(s) with closed bulb suction in the LLQ   LOCAL MEDICATIONS USED:  MARCAINE     SPECIMEN:  Source of Specimen:  Left Kidney  DISPOSITION OF SPECIMEN:  PATHOLOGY  COUNTS:  YES  TOURNIQUET:  * No tourniquets in log *  DICTATION: .Other Dictation: Dictation Number [id number]  PLAN OF CARE: Admit to inpatient   PATIENT DISPOSITION:  PACU - hemodynamically stable.   Delay start of Pharmacological VTE agent (>24hrs) due to surgical blood loss or risk of bleeding: yes

## 2012-04-23 NOTE — Preoperative (Signed)
Beta Blockers   Reason not to administer Beta Blockers:Not Applicable 

## 2012-04-23 NOTE — Anesthesia Postprocedure Evaluation (Signed)
Anesthesia Post Note  Patient: Austin [name]  Procedure(s) Performed: Procedure(s) (LRB): ROBOTIC ASSISTED LAPAROSCOPIC NEPHRECTOMY (Left) HERNIA REPAIR UMBILICAL ADULT ()  Anesthesia type: General  Patient location: PACU  Post pain: Pain level controlled  Post assessment: Post-op Vital signs reviewed  Last Vitals: BP 165/84  Pulse 84  Temp 36.3 C (Oral)  Resp 9  SpO2 97%  Post vital signs: Reviewed  Level of consciousness: sedated  Complications: No apparent anesthesia complications

## 2012-04-23 NOTE — Anesthesia Preprocedure Evaluation (Addendum)
Anesthesia Evaluation  Patient identified by MRN, date of birth, ID band Patient awake    Reviewed: Allergy & Precautions, H&P , NPO status , Patient's Chart, lab work & pertinent test results  Airway Mallampati: II TM Distance: >3 FB Neck ROM: Full    Dental  (+) Edentulous Upper, Edentulous Lower and Dental Advisory Given   Pulmonary sleep apnea ,    Pulmonary exam normal       Cardiovascular hypertension, Pt. on medications - CAD, - Past MI, - Cardiac Stents, - Peripheral Vascular Disease and - CHF Rhythm:Regular Rate:Normal     Neuro/Psych Hx of pituitary adenoma s/p transphenoidal resection. Thalamic stroke with residual quadriparesis. Full sensation, partial motor in all 4 ext.  Neuromuscular disease CVA, Residual Symptoms    GI/Hepatic negative GI ROS, Neg liver ROS,   Endo/Other  diabetes, Type 2, Oral Hypoglycemic Agents  Renal/GU Renal disease     Musculoskeletal negative musculoskeletal ROS (+)   Abdominal   Peds  Hematology negative hematology ROS (+)   Anesthesia Other Findings   Reproductive/Obstetrics                        Anesthesia Physical Anesthesia Plan  ASA: III  Anesthesia Plan: General   Post-op Pain Management:    Induction: Intravenous  Airway Management Planned: Oral ETT  Additional Equipment:   Intra-op Plan:   Post-operative Plan: Extubation in OR  Informed Consent: I have reviewed the patients History and Physical, chart, labs and discussed the procedure including the risks, benefits and alternatives for the proposed anesthesia with the patient or authorized representative who has indicated his/her understanding and acceptance.   Dental advisory given  Plan Discussed with: CRNA and Surgeon  Anesthesia Plan Comments:         Anesthesia Quick Evaluation

## 2012-04-23 NOTE — Addendum Note (Signed)
Addendum  created 04/23/12 1436 by Torrence Branagan L Burnard Enis, CRNA   Modules edited:Anesthesia Medication Administration    

## 2012-04-23 NOTE — Transfer of Care (Signed)
Immediate Anesthesia Transfer of Care Note  Patient: Austin [name]  Procedure(s) Performed: Procedure(s) (LRB) with comments: ROBOTIC ASSISTED LAPAROSCOPIC NEPHRECTOMY (Left) - radical HERNIA REPAIR UMBILICAL ADULT ()  Patient Location: PACU  Anesthesia Type: General  Level of Consciousness: awake and oriented  Airway & Oxygen Therapy: Patient Spontanous Breathing and Patient connected to face mask oxygen  Post-op Assessment: Report given to PACU RN and Post -op Vital signs reviewed and stable  Post vital signs: Reviewed and stable  Complications: No apparent anesthesia complications

## 2012-04-24 LAB — BASIC METABOLIC PANEL
BUN: 13 mg/dL (ref 6–23)
CO2: 26 mEq/L (ref 19–32)
Chloride: 98 mEq/L (ref 96–112)
Creatinine, Ser: 1.1 mg/dL (ref 0.50–1.35)
GFR calc Af Amer: 84 mL/min — ABNORMAL LOW (ref >90)
Glucose, Bld: 165 mg/dL — ABNORMAL HIGH (ref 70–99)

## 2012-04-24 NOTE — Progress Notes (Signed)
  Subjective: Patient reports : Feels good.  Not much pain.  Objective: Vital signs in last 24 hours: Temp:  [97.3 F (36.3 C)-99.1 F (37.3 C)] 98.4 F (36.9 C) (09/14 0400) Pulse Rate:  [74-103] 85  (09/14 0600) Resp:  [6-21] 9  (09/14 0600) BP: (100-167)/(56-97) 122/66 mmHg (09/14 0600) SpO2:  [95 %-100 %] 97 % (09/14 0600) Weight:  [185 lb 10 oz (84.2 kg)-189 lb 13.1 oz (86.1 kg)] 185 lb 10 oz (84.2 kg) (09/14 0000)  Intake/Output from previous day: 09/13 0701 - 09/14 0700 In: 3041.7 [I.V.:2981.7] Out: 1520 [Urine:1010; Drains:110; Blood:400] Intake/Output this shift:    Physical Exam:  General:Alert and oriented GI: Abdomen: soft non distended.  Minimal sero sanguineous drainage around JP drain.  Dressing changed. Foley draining clear urine.    Lab Results:  Basename 04/24/12 0328 04/23/12 1412  HGB 9.9* 11.1*  HCT 32.3* 35.9*   BMET  Basename 04/24/12 0328 04/23/12 1412  NA 133* --  K 4.1 --  CL 98 --  CO2 26 --  GLUCOSE 165* --  BUN 13 --  CREATININE 1.10 0.86  CALCIUM 8.3* --   No results found for this basename: LABPT:3,INR:3 in the last 72 hours No results found for this basename: LABURIN:1 in the last 72 hours Results for orders placed during the hospital encounter of 04/23/12  MRSA PCR SCREENING     Status: Normal   Collection Time   04/23/12  1:48 PM      Component Value Range Status Comment   MRSA by PCR NEGATIVE  NEGATIVE Final     Studies/Results: No results found.  Assessment/Plan:  S/P left robotic nephrectomy  Plan:  Recheck H&H in AM  Advance diet   LOS: 1 day   Carlee Tesfaye-HENRY 04/24/2012, 7:44 AM

## 2012-04-24 NOTE — Progress Notes (Signed)
Pt is alert x4 v/s stable. Pt is a q 2 turn. While repositioning the pt a large amount of bloody drainage from his jp drain saturated the  pad that was underneath the pt. The dressing was marked and reinforced. Md Nesi called and made aware no new orders at this time. Will continue to monitor.

## 2012-04-24 NOTE — Op Note (Signed)
NAME:  Austin [name], Austin [name] NO.:  [id number]  MEDICAL RECORD NO.:  [id number]  LOCATION:  1241                         FACILITY:  [facility]  PHYSICIAN:  Sebastian Ache, MD     DATE OF BIRTH:  [birth year]  DATE OF PROCEDURE:  04/23/2012 DATE OF DISCHARGE:                              OPERATIVE REPORT   DIAGNOSES: 1. Left renal mass. 2. Umbilical hernia.  PROCEDURES: 1. Robotic-assisted laparoscopic left radical nephrectomy. 2. Umbilical hernia repair.  ASSISTANT:  Pecola Leisure, PA.  FINDINGS: 1. Single artery, single vein, renovascular anatomy. 2. Dense desmoplastic reaction surrounding the lower pole of the     kidney and area presumed tumor, worrisome for T3 disease. 3. Small umbilical hernia.  The hernia neck approximately 1.5 cm     diameter.  ESTIMATED BLOOD LOSS:  50 mL.  COMPLICATIONS:  None.  DRAINS:  Jackson-Pratt drain with suction, the left lower quadrant.  SPECIMENS:  Left radical nephrectomy to permanent pathology.  INDICATION:  Austin [name] is a pleasant 56 year old gentlemaneman with unfortunate history of a stroke and cardioplegia.  He was found on axial imaging to have an enhancing solid left renal mass, worrisome for malignancy.  The mass did appear clinically localized.  Chest imaging was unremarkable and his liver function tests were within normal limits. Options were discussed with the patient's family for management including ablated therapies versus surgical approaches, nephron sparing, and non-nephron sparing.  After serious discussion with the family, they wished for attempt at left robotic partial nephrectomy.  I had explained to them that the low threshold for radical nephrectomy based on intraoperative findings as the tumor was at least T1b.  Informed consent was obtained and placed in medical record.  PROCEDURE IN DETAIL:  The patient being Ibhan Markunas verified, procedure being left robotic partial versus radical nephrectomy and  this confirmed.  The procedure was carried out.  Time-out was performed. Intravenous antibiotics administered.  General endotracheal anesthesia was introduced.  The patient was placed into a left side flank position and going approximately 20 degrees stable flexion.  Axillary roll in the kidney bump, Allen armrest were used.  A beanbag device was used to help fixate the patient.  The patient's legs were stapled straight and further fashioned on the operating table using 4-inch silk tape.  The abdomen was prepped and draped after appropriate shaving using rasping gluten x2.  Next, a high-flow, low pressure pneumoperitoneum was then obtained using Veress technique in the left lower quadrant after passing with aspiration and drop test.  Next, a 12 mm robotic camera port was placed in the left lower quadrant approximately midway between the umbilicus and xiphoid and approximately 6 cm lateral to the midline, that was followed with peritoneal cavity revealed no additional injury and no significant adhesions.  The patient's umbilical hernia was noted and there appeared to be simple with no bowel contents within this. Additional ports were then placed as follows:  The left subcostal 8 mm robotic port, left bilateral 8 mm robotic port approximately 5 fingerbreadths superomedial to anterior iliac spine.  A left paramedian inferior parotid port approximately 8 cm inferior to the level of the camera.  A 12 mm cyst  port in the midline between the superior robotic port and a 12 mm port  in the midline inferior to the camera port, that was passed for leak checks.  Initial dissection was towards the right retroperitoneum.  Incision was made lateral to the descending colon from the area of the splenic flexure towards the area of the internal ring. The colon was carefully swept medially.  The area of the lower pole of the kidney was identified and placed on gentle lateral traction using the robotic  lateral arm.  Dissection was then proceeded medially towards the area of the ureter, which was identified and also placed a unilateral retraction.  Dissection was then proceeded and strangled toward the area of the renal hilum.  Notably, during resection of the lower pole, the perirenal fat did appear quite desmoplastic and edematous, possibly worrisome for tumor.  However, at this point, we elected to proceed with partial nephrectomy depending on findings as we dissected towards the tumor.  Attention was then continued directed towards identification of renal hilum.  Single artery, single vein and renovascular anatomy was found.  Vessel loops were applied to each of these structures independently and these were set aside.  Attention was then directed to identification of the area of tumor.  Dissection was carried down to perirenal fat, directly on to the anterolateral surface of the kidney and dissection proceeded inferiorly towards the area of the tumor.  The tumor parenchymal border was deep seen, however, the fat surrounding this area appeared even more desmoplastic with many small tumor vessels feeding the area and this was quite worrisome for possibly a T3 disease.  Given this finding, it was felt that given the patient had normal contralateral kidney, that radical nephrectomy will provide the optimal oncologic control.  As such, the previously applied vessel loops were removed.  The ureter was clipped with Hem-o-Lok clips and divided.  The renal artery was controlled with a single extra large Hem- o-Lok clip followed by vascular load stapler distal to this.  The vein was controlled using a vascular load stapler x2.  Lateral attachment and superior attachment of the kidney were gently taken down robotically using a cautery and blunt dissection.  The specimen was then placed into a large endoscopic retrieval bag.  The bag was not sufficiently large to contain this specimen, therefore  it was left in its orientation.  Robot was then docked and the system was freed by extending to the inferior mesenteric port site superiorly and inferiorly, which did encompass the area of umbilical hernia.  System was then retrieved in entirety as well as the retrieval bag.  This was inspected and intact.  All sponge and needle counts were correct.  A Jackson-Pratt drain was brought through the left lateral most port, area of the peritoneal cavity fashioned and placed with a single stitch.  The 12 mm camera port site and superior assist port site were closed with fascia using figure-of-eight Vicryl and UR needle.  Upon retrieval did encompass again the previously noted umbilical hernia site and this was going to be adherently repaired during closure to provide maximal fascial apposition that was apposed using figure-of-eight PDS x8.  This brought all fascial edges into to a tension-free apposition.  There was redundant skin overlying the previous umbilical hernia.  This was carefully trimmed.  The skin was reapproximated 1st with running Vicryl and followed by 3-0 silk and Monocryl.  All incision sites were then closed as well as skin using Monocryl followed by  Dermabond.  All incision sites were infiltrated with dilute Marcaine and procedure was then terminated.  The patient was tolerated the procedure well.  There were no immediate periprocedural complications.  The patient was taken to the postanesthesia care unit in stable condition          ______________________________ Sebastian Ache, MD     TM/MEDQ  D:  04/23/2012  T:  04/24/2012  Job:  [id number]

## 2012-04-25 LAB — CBC
HCT: 33.7 % — ABNORMAL LOW (ref 39.0–52.0)
Hemoglobin: 10 g/dL — ABNORMAL LOW (ref 13.0–17.0)
MCH: 23.9 pg — ABNORMAL LOW (ref 26.0–34.0)
MCV: 80.4 fL (ref 78.0–100.0)
RBC: 4.19 MIL/uL — ABNORMAL LOW (ref 4.22–5.81)

## 2012-04-25 LAB — BASIC METABOLIC PANEL
BUN: 17 mg/dL (ref 6–23)
CO2: 25 mEq/L (ref 19–32)
Calcium: 8.6 mg/dL (ref 8.4–10.5)
Creatinine, Ser: 1.08 mg/dL (ref 0.50–1.35)
Glucose, Bld: 146 mg/dL — ABNORMAL HIGH (ref 70–99)

## 2012-04-25 MED ORDER — BISACODYL 10 MG RE SUPP
10.0000 mg | Freq: Once | RECTAL | Status: AC
  Administered 2012-04-25: 10 mg via RECTAL
  Filled 2012-04-25: qty 1

## 2012-04-25 NOTE — Progress Notes (Signed)
  Subjective: Patient reports No pain.  Tolerates diet well.  No BM's yet.  Objective: Vital signs in last 24 hours: Temp:  [97.8 F (36.6 C)-98.6 F (37 C)] 98.4 F (36.9 C) (09/15 0800) Pulse Rate:  [63-102] 70  (09/15 0600) Resp:  [5-18] 10  (09/15 0600) BP: (98-133)/(50-82) 103/59 mmHg (09/15 0600) SpO2:  [93 %-98 %] 96 % (09/15 0600)  Intake/Output from previous day: 09/14 0701 - 09/15 0700 In: 825 [I.V.:825] Out: 2090 [Urine:2060; Drains:30] Intake/Output this shift:    Physical Exam:  General:In good spirits Lungs - Normal respiratory effort, chest expands symmetrically.  Abdomen - Soft, non-tender & non-distended Wounds clean and dry.   JP drainage: 30 ml today. Foley draining clear urine.  Lab Results:  Basename 04/24/12 0328 04/23/12 1412  HGB 9.9* 11.1*  HCT 32.3* 35.9*   BMET  Basename 04/24/12 0328 04/23/12 1412  NA 133* --  K 4.1 --  CL 98 --  CO2 26 --  GLUCOSE 165* --  BUN 13 --  CREATININE 1.10 0.86  CALCIUM 8.3* --   No results found for this basename: LABPT:3,INR:3 in the last 72 hours No results found for this basename: LABURIN:1 in the last 72 hours Results for orders placed during the hospital encounter of 04/23/12  MRSA PCR SCREENING     Status: Normal   Collection Time   04/23/12  1:48 PM      Component Value Range Status Comment   MRSA by PCR NEGATIVE  NEGATIVE Final      Assessment/Plan:  S/P robotic assisted left radical nephrectomy  PLAN:  Remove Foley  OOB  CBC Wife would feel more comfortable taking him home if he gets out of bed and has BM.  LOS: 2 days   Austin [name] 04/25/2012, 9:10 AM

## 2012-04-25 NOTE — Progress Notes (Signed)
Spoke to on call Dr Annabell Howells regarding discontinuation of foley catheter. Patient requested for foley to be d/c'd in am. Order given and with remove foley catheter at 0500

## 2012-04-26 ENCOUNTER — Encounter (HOSPITAL_COMMUNITY): Payer: Self-pay | Admitting: Urology

## 2012-04-26 MED ORDER — OXYCODONE-ACETAMINOPHEN 5-325 MG PO TABS: 1.0000 | ORAL_TABLET | ORAL | Status: AC | PRN

## 2012-04-26 MED ORDER — SENNA-DOCUSATE SODIUM 8.6-50 MG PO TABS: 1.0000 | ORAL_TABLET | Freq: Two times a day (BID) | ORAL | Status: DC

## 2012-04-26 NOTE — Discharge Summary (Signed)
Physician Discharge Summary  Patient ID: Austin [name] MRN: [id number] DOB/AGE: 08-31-1955 56 y.o.  Admit date: 04/23/2012 Discharge date: 04/26/2012  Admission Diagnoses: left renal neoplasm  Discharge Diagnoses: left renal neoplasm Active Problems:  * No active hospital problems. *    Discharged Condition: good  Hospital Course:   Pt underwent left robotic radical nephrecotmy and umbilical hernia repair on 04/23/2012 without acute complicaitons. 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 His hgb remained stable, his JP drain output trended down to slight, and his renal function remained acceptable. By 9/16, the day of discharge, he was tolerating a regular diet, pain was controlled by PO meds, and he was at his baseline activity and felt suitable for discharge.  Consults: None  Significant Diagnostic Studies: labs: hgb 10.0 9/16. Cr 1.08 9/16  Treatments: surgery: left radical robotic nephrectomy 04/23/12  Discharge Exam: Blood pressure 98/56, pulse 62, temperature 98.3 F (36.8 C), temperature source Oral, resp. rate 12, height 6' (1.829 m), weight 85 kg (187 lb 6.3 oz), SpO2 97.00%. General appearance: alert, cooperative and stigamata of quadraplegia, at baseline Head: Normocephalic, without obvious abnormality, atraumatic Eyes: conjunctivae/corneas clear. PERRL, EOM's intact. Fundi benign. Throat: lips, mucosa, and tongue normal; teeth and gums normal Back: symmetric, no curvature. ROM normal. No CVA tenderness. Resp: clear to auscultation bilaterally and normal percussion bilaterally Chest wall: no tenderness Cardio: regular rate and rhythm, S1, S2 normal, no murmur, click, rub or gallop GI: soft, non-tender; bowel sounds normal; no masses,  no organomegaly Male genitalia: normal Extremities: extremities normal, atraumatic, no cyanosis or edema Skin: Skin color, texture, turgor normal. No rashes or lesions Lymph  nodes: Cervical, supraclavicular, and axillary nodes normal. Neurologic: Grossly normal Incision/Wound: lower midline incision c/d/i with mild (expected bruising) fascia palpably intact. Left LLQ JP site with scant serous drainage, removed by MD and dressed with dry dressing.  Disposition: 01-Home or Self Care  Discharge Orders    Future Appointments: Provider: Department: Dept Phone: Center:   05/31/2012 9:30 AM Kristian Covey, MD [facility] [phone] [facility]   07/26/2012 10:00 AM Kristian Covey, MD [facility] [phone] [facility]       Medication List     As of 04/26/2012  7:15 AM    TAKE these medications         aspirin 325 MG tablet   Take 325 mg by mouth every morning.      atorvastatin 20 MG tablet   Commonly known as: LIPITOR   Take 20 mg by mouth daily before breakfast.      CENTRUM SILVER PO   Take 1 tablet by mouth every morning.      clopidogrel 75 MG tablet   Commonly known as: PLAVIX   Take 75 mg by mouth at bedtime.      Cranberry-Vitamin C-Vitamin E 4200-20-3 MG-MG-UNIT Caps   Take 1 capsule by mouth daily.      escitalopram 10 MG tablet   Commonly known as: LEXAPRO   Take 10 mg by mouth daily before breakfast.      ferrous sulfate 325 (65 FE) MG tablet   Take 325 mg by mouth daily with breakfast.      fish oil-omega-3 fatty acids 1000 MG capsule   Take 2 g by mouth daily.      ketoconazole 2 % cream   Commonly known as: NIZORAL   Apply 1 application topically as needed. Rash      lisinopril-hydrochlorothiazide 10-12.5 MG per tablet  Commonly known as: PRINZIDE,ZESTORETIC   Take 1 tablet by mouth daily before breakfast.      metFORMIN 500 MG tablet   Commonly known as: GLUCOPHAGE   Take 1 tablet (500 mg total) by mouth daily with breakfast.      metFORMIN 500 MG tablet   Commonly known as: GLUCOPHAGE   Take 500 mg by mouth daily with lunch.      nitrofurantoin (macrocrystal-monohydrate) 100 MG capsule    Commonly known as: MACROBID   Take 100 mg by mouth every evening.      oxybutynin 5 MG tablet   Commonly known as: DITROPAN   Take 5 mg by mouth 2 (two) times daily.      oxyCODONE-acetaminophen 5-325 MG per tablet   Commonly known as: PERCOCET/ROXICET   Take 1 tablet by mouth every 4 (four) hours as needed for pain.      sennosides-docusate sodium 8.6-50 MG tablet   Commonly known as: SENOKOT-S   Take 1 tablet by mouth 2 (two) times daily.      testosterone cypionate 200 MG/ML injection   Commonly known as: DEPOTESTOTERONE CYPIONATE   Inject 200 mg into the muscle every 14 (fourteen) days. tuesday      traZODone 100 MG tablet   Commonly known as: DESYREL   Take 100 mg by mouth at bedtime.      triamcinolone cream 0.1 %   Commonly known as: KENALOG   Apply 1 application topically as needed. Dry skin on face           Follow-up Information    Follow up with Sebastian Ache, MD. (at 3:30 pm 05/06/12 with Dr. Berneice Heinrich)    Contact information:   [address] [facility] [place] [address] [phone]          Signed: Sebastian Ache 04/26/2012, 7:15 AM

## 2012-04-26 NOTE — Progress Notes (Signed)
[id number]/Somalia Segler, RN, BSN, CCM: CHART REVIEWED AND UPDATED. NO DISCHARGE NEEDS PRESENT AT THIS TIME. CASE MANAGEMENT [phone]  

## 2012-05-14 ENCOUNTER — Telehealth: Payer: Self-pay | Admitting: Family Medicine

## 2012-05-14 NOTE — Telephone Encounter (Signed)
Pt stopped taking plavix on 04-11-2012. Pt  had removal of left kidney due to cancer. Pt had to stopped taking med prior to surgery and does not work to start med again. Pt believe that risks out weigh benefits. Please call gloria

## 2012-05-17 NOTE — Telephone Encounter (Signed)
Discussed with wife risk and benefits of antiplatelet agents such as Plavix. 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 He is however high risk for thrombosis because of his sedentary nature. He is currently taking aspirin concomitantly and remain doing so. We'll discuss Plavix issues further at followup in 2 weeks

## 2012-05-31 ENCOUNTER — Ambulatory Visit (INDEPENDENT_AMBULATORY_CARE_PROVIDER_SITE_OTHER): Payer: Medicare Other | Admitting: Family Medicine

## 2012-05-31 ENCOUNTER — Encounter: Payer: Self-pay | Admitting: Family Medicine

## 2012-05-31 VITALS — BP 140/88 | Temp 97.8°F | Wt 188.0 lb

## 2012-05-31 DIAGNOSIS — I10 Essential (primary) hypertension: Secondary | ICD-10-CM

## 2012-05-31 DIAGNOSIS — E119 Type 2 diabetes mellitus without complications: Secondary | ICD-10-CM

## 2012-05-31 DIAGNOSIS — D649 Anemia, unspecified: Secondary | ICD-10-CM

## 2012-05-31 DIAGNOSIS — Z23 Encounter for immunization: Secondary | ICD-10-CM

## 2012-05-31 LAB — CBC WITH DIFFERENTIAL/PLATELET
Eosinophils Absolute: 0.1 10*3/uL (ref 0.0–0.7)
HCT: 34.7 % — ABNORMAL LOW (ref 39.0–52.0)
Lymphs Abs: 1 10*3/uL (ref 0.7–4.0)
MCHC: 32 g/dL (ref 30.0–36.0)
MCV: 76 fl — ABNORMAL LOW (ref 78.0–100.0)
Monocytes Absolute: 1.2 10*3/uL — ABNORMAL HIGH (ref 0.1–1.0)
Neutrophils Relative %: 83.1 % — ABNORMAL HIGH (ref 43.0–77.0)
Platelets: 294 10*3/uL (ref 150.0–400.0)

## 2012-05-31 LAB — HEMOGLOBIN A1C: Hgb A1c MFr Bld: 7.1 % — ABNORMAL HIGH (ref 4.6–6.5)

## 2012-05-31 NOTE — Progress Notes (Signed)
Subjective:    Patient ID: Austin [name], male    DOB: 24-Mar-1956, 56 y.o.   MRN: [id number]  HPI  Medical followup. Patient had recent surgery for renal cell carcinoma. He had nephrectomy and that went well. This was performed laparoscopically and also had umbilical hernia repair.  Discharge hemoglobin 10.0. No dizziness. Type 2 diabetes treated with metformin. Most recent A1c 7.3%. Recent basic metabolic panel per urology normal with normal creatinine. Patient remains on lisinopril HCTZ for hypertension. Blood pressures been stable by home readings. No recent chest pains.  Patient has pituitary tumor followed at [facility]. He is followed there next month. Repeat MRI has been scheduled. He has normal thyroid function but hypogonadism. On testosterone replacement with testosterone level in June normal range.  Past Medical History  Diagnosis Date  . Hx of colonic polyps   . Hypertension   . Pituitary adenoma   . Urinary incontinence   . Venous stasis     edema  . 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 Hypogonadism   . Neuromuscular disorder     quadraplegic  . Chronic kidney disease     Left Renal Mass  . Sleep apnea     stopbang=4  . Cerebrovascular accident 33    hx of brainstem stroke, residual diminished lung capacity   Past Surgical History  Procedure Date  . Tracheostomy tube placement 02/1993    removed 04/1993  . Stomach peg 02/1993    removed 5-6 years later  . Cholecystectomy   . Pituitary surgery 2007, 2011  . Gamma knife radiation surgery 05/2010    for pituitary  . Colonoscopy 02/27/2012    Procedure: COLONOSCOPY;  Surgeon: Beverley Fiedler, MD;  Location: [facility];  Service: Gastroenterology;  Laterality: N/A;  . Vocal cord surgery     injected with collagen, then fat from stomach to improve speech s/p stroke  . Vasectomy 1984  . Robotic partial nephrectomy 04-23-12    left   Left Renal Mass  . Robot assisted laparoscopic  nephrectomy 04/23/2012    Procedure: ROBOTIC ASSISTED LAPAROSCOPIC NEPHRECTOMY;  Surgeon: Sebastian Ache, MD;  Location: [facility];  Service: Urology;  Laterality: Left;  radical  . Umbilical hernia repair 04/23/2012    Procedure: HERNIA REPAIR UMBILICAL ADULT;  Surgeon: Sebastian Ache, MD;  Location: [facility];  Service: Urology;;    reports that he quit smoking about 19 years ago. His smoking use included Cigarettes. He has a 11.5 pack-year smoking history. He has never used smokeless tobacco. He reports that he does not drink alcohol or use illicit drugs. family history includes Alcohol abuse in his fathers; Arthritis in his maternal grandmother and mother; Colon cancer in his brother; Esophageal cancer (age of onset:60) in his father; Hyperlipidemia in his mother; Hypertension in his brother; Stroke in his paternal grandmother; Throat cancer in his father; and Uterine cancer (age of onset:58) in his mother. Allergies  Allergen Reactions  . Penicillins     REACTION: rash     Review of Systems  Constitutional: Negative for fatigue.  Eyes: Negative for visual disturbance.  Respiratory: Negative for cough, chest tightness and shortness of breath.   Cardiovascular: Negative for chest pain, palpitations and leg swelling.  Neurological: Negative for dizziness, syncope, weakness, light-headedness and headaches.       Objective:   Physical Exam  Constitutional: He is oriented to person, place, and time. He appears well-developed and well-nourished.  Cardiovascular: Normal rate and regular  rhythm.   Pulmonary/Chest: Effort normal and breath sounds normal. No respiratory distress. He has no wheezes. He has no rales.  Musculoskeletal:       Support hose on.  Neurological: He is alert and oriented to person, place, and time.          Assessment & Plan:  #1 recent nephrectomy for renal cell carcinoma. Patient did well. Discharge hemoglobin 10.0. Recheck CBC. #2 type 2 diabetes. Patient on  metformin. Recheck A1c  #3 hypertension. Borderline elevation here but stable by home readings. Continue close monitoring  #4 health maintenance. Flu vaccine given. #5 Anemia postoperatively.  Recheck CBC.

## 2012-06-01 NOTE — Progress Notes (Signed)
Quick Note:  Pt wife informed ______ 

## 2012-06-12 ENCOUNTER — Other Ambulatory Visit: Payer: Self-pay | Admitting: Family Medicine

## 2012-06-14 NOTE — Telephone Encounter (Signed)
Refill for 6 months. 

## 2012-06-14 NOTE — Telephone Encounter (Signed)
Last testosterone lab check was 02-02-12, #574.95.  Pt is taking 1 ml every 14 days

## 2012-06-15 ENCOUNTER — Other Ambulatory Visit: Payer: Self-pay | Admitting: Family Medicine

## 2012-06-24 ENCOUNTER — Telehealth: Payer: Self-pay | Admitting: Family Medicine

## 2012-06-24 ENCOUNTER — Other Ambulatory Visit (INDEPENDENT_AMBULATORY_CARE_PROVIDER_SITE_OTHER): Payer: Medicare Other

## 2012-06-24 DIAGNOSIS — N319 Neuromuscular dysfunction of bladder, unspecified: Secondary | ICD-10-CM

## 2012-06-24 LAB — POCT URINALYSIS DIPSTICK
Blood, UA: NEGATIVE
Ketones, UA: NEGATIVE
Nitrite, UA: POSITIVE
Protein, UA: NEGATIVE
pH, UA: 6.5

## 2012-06-24 NOTE — Telephone Encounter (Signed)
Pt wife Malachi Bonds informed

## 2012-06-24 NOTE — Telephone Encounter (Signed)
Caller: Gloria/Spouse; Patient Name: Austin [name]; PCP: Evelena Peat [facility]); Best Callback Phone Number: [phone] Onset: 06/24/12  Wife calling in states that urine is cloudy, with a foul odor, no blood noted and would like to know if she can drop urine sample off at office for testing.  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   Unable to verify fever as patient does not register on a thermometer when temp is elevated. Patient is unable to feel discomfort/symptoms of UTI due to being Quadraplegic. Urgent symptom of "Urine color change not related to consuming food or food dyes and not previously evaluated" per Urinary Symptoms - Male.  OFFICE NOTE: PLEASE FOLLOW UP AS TO WHETHER OK TO DROP OFF URINE SAMPLE OR IF OFFICE VISIT IS REQUIRED.

## 2012-06-24 NOTE — Telephone Encounter (Signed)
Yes and set up urine culture

## 2012-06-25 ENCOUNTER — Telehealth: Payer: Self-pay | Admitting: Family Medicine

## 2012-06-25 MED ORDER — CIPROFLOXACIN HCL 500 MG PO TABS: 500.0000 mg | ORAL_TABLET | Freq: Two times a day (BID) | ORAL | Status: DC

## 2012-06-25 NOTE — Telephone Encounter (Signed)
Wife innformed Cipro called in

## 2012-06-25 NOTE — Telephone Encounter (Signed)
Pt wife is calling requesting ua result

## 2012-06-25 NOTE — Telephone Encounter (Signed)
Cipro 500 mg po bid for 7 days. 

## 2012-06-28 NOTE — Progress Notes (Signed)
Quick Note:  Pt wife Austin [name] reports foul smell and cloudy urine has subsided, he has only been on Cipro 2 days. She will keep a close eye on urine and call back if necessary. ______

## 2012-07-26 ENCOUNTER — Encounter: Payer: Self-pay | Admitting: Family Medicine

## 2012-07-26 ENCOUNTER — Ambulatory Visit (INDEPENDENT_AMBULATORY_CARE_PROVIDER_SITE_OTHER): Payer: Medicare Other | Admitting: Family Medicine

## 2012-07-26 VITALS — BP 150/92 | Temp 98.0°F

## 2012-07-26 DIAGNOSIS — I10 Essential (primary) hypertension: Secondary | ICD-10-CM

## 2012-07-26 DIAGNOSIS — R5383 Other fatigue: Secondary | ICD-10-CM

## 2012-07-26 DIAGNOSIS — R5381 Other malaise: Secondary | ICD-10-CM

## 2012-07-26 DIAGNOSIS — Z85858 Personal history of malignant neoplasm of other endocrine glands: Secondary | ICD-10-CM

## 2012-07-26 DIAGNOSIS — E119 Type 2 diabetes mellitus without complications: Secondary | ICD-10-CM

## 2012-07-26 NOTE — Progress Notes (Signed)
Subjective:    Patient ID: Austin [name], male    DOB: Mar 28, 1956, 56 y.o.   MRN: [id number]  HPI  Medical followup. Patient has history of type 2 diabetes which was recently diagnosed, history of pituitary neoplasm, history of cerebellar CVA, hypogonadism, left renal cell carcinoma with recent nephrectomy, Recent followup at [facility] with repeat MRI which showed decrease in tumor size. Recommended followup interval 2.5 years. He has some chronic fatigue. Previous TSH normal. Has been on testosterone replacement but has not seen any increase in energy with this. CBGs stable. No recent symptoms of hyperglycemia. Appetite is stable. No recent new urinary symptoms. No fever or chills. Neurogenic bladder related to prior stroke and does in and out catheterizations periodically. Recent A1c 7.1% area treated with metformin.  Past Medical History  Diagnosis Date  . Hx of colonic polyps   . Hypertension   . Pituitary adenoma   . Urinary incontinence   . Venous stasis     edema  . 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 Hypogonadism   . Neuromuscular disorder     quadraplegic  . Chronic kidney disease     Left Renal Mass  . Sleep apnea     stopbang=4  . Cerebrovascular accident 66    hx of brainstem stroke, residual diminished lung capacity   Past Surgical History  Procedure Date  . Tracheostomy tube placement 02/1993    removed 04/1993  . Stomach peg 02/1993    removed 5-6 years later  . Cholecystectomy   . Pituitary surgery 2007, 2011  . Gamma knife radiation surgery 05/2010    for pituitary  . Colonoscopy 02/27/2012    Procedure: COLONOSCOPY;  Surgeon: Beverley Fiedler, MD;  Location: [facility];  Service: Gastroenterology;  Laterality: N/A;  . Vocal cord surgery     injected with collagen, then fat from stomach to improve speech s/p stroke  . Vasectomy 1984  . Robotic partial nephrectomy 04-23-12    left   Left Renal Mass  . Robot  assisted laparoscopic nephrectomy 04/23/2012    Procedure: ROBOTIC ASSISTED LAPAROSCOPIC NEPHRECTOMY;  Surgeon: Sebastian Ache, MD;  Location: [facility];  Service: Urology;  Laterality: Left;  radical  . Umbilical hernia repair 04/23/2012    Procedure: HERNIA REPAIR UMBILICAL ADULT;  Surgeon: Sebastian Ache, MD;  Location: [facility];  Service: Urology;;    reports that he quit smoking about 19 years ago. His smoking use included Cigarettes. He has a 11.5 pack-year smoking history. He has never used smokeless tobacco. He reports that he does not drink alcohol or use illicit drugs. family history includes Alcohol abuse in his fathers; Arthritis in his maternal grandmother and mother; Colon cancer in his brother; Esophageal cancer (age of onset:60) in his father; Hyperlipidemia in his mother; Hypertension in his brother; Stroke in his paternal grandmother; Throat cancer in his father; and Uterine cancer (age of onset:58) in his mother. Allergies  Allergen Reactions  . Penicillins     REACTION: rash      Review of Systems  Constitutional: Positive for fatigue.  Eyes: Negative for visual disturbance.  Respiratory: Negative for cough, chest tightness and shortness of breath.   Cardiovascular: Positive for leg swelling (Mild and unchanged). Negative for chest pain and palpitations.  Neurological: Negative for dizziness, syncope, weakness, light-headedness and headaches.       Objective:   Physical Exam  Constitutional: He is oriented to person, place, and time. He appears  well-developed and well-nourished.  Cardiovascular: Normal rate and regular rhythm.   Pulmonary/Chest: Effort normal and breath sounds normal. No respiratory distress. He has no wheezes. He has no rales.  Musculoskeletal:       Trace leg edema bilaterally. No ulcers  Neurological: He is alert and oriented to person, place, and time.          Assessment & Plan:  #1 type 2 diabetes. History of severe contrast recent A1c 7.1%.  Recheck at followup in 3 months  #2 history of pituitary tumor. Recommended followup interval 2.5 years #3 hypertension. Slightly elevated today. Continue close monitoring. Be in touch if consistently greater than 140/90 #4 fatigue. Likely multifactorial. Repeat TSH and testosterone at followup. We did not get labs today as our computer system was down

## 2012-08-16 ENCOUNTER — Other Ambulatory Visit: Payer: Self-pay | Admitting: Family Medicine

## 2012-09-08 ENCOUNTER — Telehealth: Payer: Self-pay | Admitting: Family Medicine

## 2012-09-08 DIAGNOSIS — R8290 Unspecified abnormal findings in urine: Secondary | ICD-10-CM

## 2012-09-08 NOTE — Telephone Encounter (Signed)
Patient Information:  Caller Name: Austin [name]  Phone: [phone]  Patient: Austin, [name]  Gender: Male  DOB: 07/23/1956  Age: 57 Years  PCP: Evelena Peat [facility])  Office Follow Up:  Does the office need to follow up with this patient?: Yes  Instructions For The Office: Please call  Wife/Austin [name] regarding urine specimen- Would like to bring in urine specimen on 09/10/12. Austin [name] has had cloudy urine with odor x one week. Also, asking if 3 month follow up can be scheduled for early April. [name] has appointment with urologist Dr. Berneice Heinrich in late March.   Symptoms  Reason For Call & Symptoms: Austin [name]/ Wife states Austin [name] is a quadraplegic. States she does in and out catheterization in AM and PM. States urine is cloudy an has had odor x one week. No fever. No bleeding noted. Afebrile. Asking if she can bring in a urine specimen - prefers to bring specimen to office on Friday  -09/10/12 if possible. Also, states Biil was  scheduled for a 3 month follow up wtih Dr. Caryl Never in March. States Austin [name] is scheduled to see his Urologist- Dr. Berneice Heinrich in late March. Asking if would be okay to  schedule 3 month follow up with Dr. Caryl Never in early April ?  Reviewed Health History In EMR: Yes  Reviewed Medications In EMR: Yes  Reviewed Allergies In EMR: Yes  Reviewed Surgeries / Procedures: Yes  Date of Onset of Symptoms: 09/01/2012  Treatments Tried: Cranberry capsules  Treatments Tried Worked: No  Guideline(s) Used:  Urination Pain - Male  No Protocol Available - Sick Adult  Disposition Per Guideline:   Discuss with PCP and Callback by Nurse Today  Reason For Disposition Reached:   Nursing judgment  Advice Given:  N/A

## 2012-09-09 ENCOUNTER — Other Ambulatory Visit: Payer: Medicare Other

## 2012-09-09 NOTE — Telephone Encounter (Signed)
Send for urine cx.  OK to see in early April or whenever that want to be seen.

## 2012-09-09 NOTE — Telephone Encounter (Signed)
Wife informed on VM OK to bring in urine for culture.

## 2012-09-10 ENCOUNTER — Other Ambulatory Visit: Payer: Medicare Other

## 2012-09-10 DIAGNOSIS — R8290 Unspecified abnormal findings in urine: Secondary | ICD-10-CM

## 2012-09-13 LAB — URINE CULTURE

## 2012-09-14 ENCOUNTER — Telehealth: Payer: Self-pay | Admitting: Family Medicine

## 2012-09-14 NOTE — Telephone Encounter (Signed)
Pt would like to know results of UA done Fri. 09/10/12. Pt is still having symptoms. Pls call asap.

## 2012-09-15 MED ORDER — CIPROFLOXACIN HCL 500 MG PO TABS: 500.0000 mg | ORAL_TABLET | Freq: Two times a day (BID) | ORAL | Status: DC

## 2012-09-15 NOTE — Telephone Encounter (Signed)
I sent script e-scribe and spoke with pt. 

## 2012-09-15 NOTE — Telephone Encounter (Signed)
This grew Pseudomonas bacteria. Call in Cipro 500 mg bid for 10 days. Follow up with Dr. Caryl Never.

## 2012-10-28 ENCOUNTER — Ambulatory Visit (HOSPITAL_COMMUNITY)
Admission: RE | Admit: 2012-10-28 | Discharge: 2012-10-28 | Disposition: A | Discharge status: Home / Self Care | Payer: Medicare Other | Source: Ambulatory Visit | Attending: Urology | Admitting: Urology

## 2012-10-28 ENCOUNTER — Other Ambulatory Visit (HOSPITAL_COMMUNITY): Payer: Self-pay | Admitting: Urology

## 2012-10-28 DIAGNOSIS — C649 Malignant neoplasm of unspecified kidney, except renal pelvis: Secondary | ICD-10-CM

## 2012-10-28 DIAGNOSIS — R918 Other nonspecific abnormal finding of lung field: Secondary | ICD-10-CM | POA: Insufficient documentation

## 2012-11-09 ENCOUNTER — Ambulatory Visit (INDEPENDENT_AMBULATORY_CARE_PROVIDER_SITE_OTHER): Payer: Medicare Other | Admitting: Family Medicine

## 2012-11-09 VITALS — Temp 98.4°F

## 2012-11-09 DIAGNOSIS — E119 Type 2 diabetes mellitus without complications: Secondary | ICD-10-CM

## 2012-11-09 DIAGNOSIS — N319 Neuromuscular dysfunction of bladder, unspecified: Secondary | ICD-10-CM

## 2012-11-09 DIAGNOSIS — E291 Testicular hypofunction: Secondary | ICD-10-CM

## 2012-11-09 DIAGNOSIS — C649 Malignant neoplasm of unspecified kidney, except renal pelvis: Secondary | ICD-10-CM

## 2012-11-09 DIAGNOSIS — E785 Hyperlipidemia, unspecified: Secondary | ICD-10-CM

## 2012-11-09 DIAGNOSIS — C642 Malignant neoplasm of left kidney, except renal pelvis: Secondary | ICD-10-CM

## 2012-11-09 DIAGNOSIS — I10 Essential (primary) hypertension: Secondary | ICD-10-CM

## 2012-11-09 DIAGNOSIS — R300 Dysuria: Secondary | ICD-10-CM

## 2012-11-09 LAB — HEPATIC FUNCTION PANEL
Albumin: 3.3 g/dL — ABNORMAL LOW (ref 3.5–5.2)
Total Protein: 7.6 g/dL (ref 6.0–8.3)

## 2012-11-09 LAB — BASIC METABOLIC PANEL
BUN: 17 mg/dL (ref 6–23)
CO2: 25 mEq/L (ref 19–32)
Calcium: 9.2 mg/dL (ref 8.4–10.5)
Creatinine, Ser: 1.2 mg/dL (ref 0.4–1.5)
Glucose, Bld: 137 mg/dL — ABNORMAL HIGH (ref 70–99)

## 2012-11-09 LAB — POCT URINALYSIS DIPSTICK
Bilirubin, UA: NEGATIVE
Blood, UA: NEGATIVE
Glucose, UA: NEGATIVE
Spec Grav, UA: <=1.005
pH, UA: 7.5

## 2012-11-09 LAB — LIPID PANEL
Cholesterol: 163 mg/dL (ref 0–200)
HDL: 33.8 mg/dL — ABNORMAL LOW (ref >39.00)
Triglycerides: 301 mg/dL — ABNORMAL HIGH (ref 0.0–149.0)

## 2012-11-09 LAB — TESTOSTERONE: Testosterone: 348.52 ng/dL — ABNORMAL LOW (ref 350.00–890.00)

## 2012-11-09 MED ORDER — LISINOPRIL-HYDROCHLOROTHIAZIDE 20-12.5 MG PO TABS: 1.0000 | ORAL_TABLET | Freq: Every day | ORAL | Status: DC

## 2012-11-09 MED ORDER — SULFAMETHOXAZOLE-TRIMETHOPRIM 800-160 MG PO TABS: 1.0000 | ORAL_TABLET | Freq: Two times a day (BID) | ORAL | Status: DC

## 2012-11-09 NOTE — Progress Notes (Signed)
Subjective:    Patient ID: Austin [name], male    DOB: 1955-10-15, 57 y.o.   MRN: [id number]  HPI  Patient seen problems including type 2 diabetes, hypertension, history of renal cell carcinoma, history of pituitary neoplasm, history of brain stem CVA, hypogonadism. He has neurogenic bladder.  Patient recent chest x-ray and CT scan per urology. Chest x-ray was normal. He recently had some cloudy urine and chills but no documented fever. He still goes in and out catheterizations. Wife is concerned he may have recurrent UTI. The areolar he is likely colonized from 15 years of doing in and out catheterizations  Hypertension treated with lisinopril HCTZ. Compliant with therapy. Not monitoring blood pressures regularly. No regular headaches. No dizziness.  Type 2 diabetes. Last A1c 7.1%. Patient takes metformin  Past Medical History  Diagnosis Date  . Hx of colonic polyps   . Hypertension   . Pituitary adenoma   . Urinary incontinence   . Venous stasis     edema  . 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 Hypogonadism   . Neuromuscular disorder     quadraplegic  . Chronic kidney disease     Left Renal Mass  . Sleep apnea     stopbang=4  . Cerebrovascular accident 67    hx of brainstem stroke, residual diminished lung capacity   Past Surgical History  Procedure Laterality Date  . Tracheostomy tube placement  02/1993    removed 04/1993  . Stomach peg  02/1993    removed 5-6 years later  . Cholecystectomy    . Pituitary surgery  2007, 2011  . Gamma knife radiation surgery  05/2010    for pituitary  . Colonoscopy  02/27/2012    Procedure: COLONOSCOPY;  Surgeon: Beverley Fiedler, MD;  Location: [facility];  Service: Gastroenterology;  Laterality: N/A;  . Vocal cord surgery      injected with collagen, then fat from stomach to improve speech s/p stroke  . Vasectomy  1984  . Robotic partial nephrectomy  04-23-12    left   Left Renal Mass  . Robot  assisted laparoscopic nephrectomy  04/23/2012    Procedure: ROBOTIC ASSISTED LAPAROSCOPIC NEPHRECTOMY;  Surgeon: Sebastian Ache, MD;  Location: [facility];  Service: Urology;  Laterality: Left;  radical  . Umbilical hernia repair  04/23/2012    Procedure: HERNIA REPAIR UMBILICAL ADULT;  Surgeon: Sebastian Ache, MD;  Location: [facility];  Service: Urology;;    reports that he quit smoking about 20 years ago. His smoking use included Cigarettes. He has a 11.5 pack-year smoking history. He has never used smokeless tobacco. He reports that he does not drink alcohol or use illicit drugs. family history includes Alcohol abuse in his fathers; Arthritis in his maternal grandmother and mother; Colon cancer in his brother; Esophageal cancer (age of onset: 21) in his father; Hyperlipidemia in his mother; Hypertension in his brother; Stroke in his paternal grandmother; Throat cancer in his father; and Uterine cancer (age of onset: 84) in his mother. Allergies  Allergen Reactions  . Penicillins     REACTION: rash      Review of Systems  Constitutional: Positive for chills. Negative for fatigue and unexpected weight change.  Eyes: Negative for visual disturbance.  Respiratory: Negative for cough, chest tightness and shortness of breath.   Cardiovascular: Negative for chest pain, palpitations and leg swelling.  Gastrointestinal: Negative for abdominal pain.  Genitourinary: Positive for dysuria.  Neurological: Negative for dizziness,  syncope, weakness, light-headedness and headaches.       Objective:   Physical Exam  Constitutional: He is oriented to person, place, and time. He appears well-developed and well-nourished.  Neck: Neck supple. No thyromegaly present.  Cardiovascular: Normal rate and regular rhythm.   Pulmonary/Chest: Effort normal and breath sounds normal. No respiratory distress. He has no wheezes. He has no rales.  Musculoskeletal:  Trace edema legs bilaterally which is chronic  Neurological:  He is alert and oriented to person, place, and time.  Psychiatric: He has a normal mood and affect. His behavior is normal.          Assessment & Plan:  #1 hypertension. Suboptimal control. Titrate lisinopril 20/12.5 one daily. Reassess blood pressure 2 months. Check basic metabolic panel #2 type 2 diabetes. History of good control. Recheck A1c #3 hyperlipidemia. Recheck lipid and hepatic panel #4 hypogonadism. Check repeat testosterone level. #5 history of renal cell carcinoma. Followed closely by urology. Recent chest x-ray normal. CT scan results pending #6 recurrent dysuria. History of neurogenic bladder. Likely colonized. We explained rationale for not treating unless clear fever or chills. Recent symptoms as above. Urine culture sent. Septra DS one twice a day for 7 days if he develops any fever

## 2012-11-10 ENCOUNTER — Other Ambulatory Visit: Payer: Self-pay | Admitting: *Deleted

## 2012-11-10 MED ORDER — METFORMIN HCL 500 MG PO TABS: 500.0000 mg | ORAL_TABLET | Freq: Two times a day (BID) | ORAL | Status: DC

## 2012-11-10 NOTE — Progress Notes (Signed)
Quick Note:  Pt wife Malachi Bonds informed ______

## 2012-11-11 ENCOUNTER — Telehealth: Payer: Self-pay | Admitting: Family Medicine

## 2012-11-11 MED ORDER — METFORMIN HCL 500 MG PO TABS: 500.0000 mg | ORAL_TABLET | Freq: Two times a day (BID) | ORAL | Status: DC

## 2012-11-11 NOTE — Telephone Encounter (Signed)
I sent 30 day supply (#60) to [facility] and a 90 day (#180) to [facility]

## 2012-11-11 NOTE — Telephone Encounter (Signed)
Pt would like the RX for metFORMIN (GLUCOPHAGE) 500 MG tablet sent to [facility]. Only one month supply was sent to [facility] on 11/10/12.  [facility] called pt and told them they only fill 90 day supply. Pt has cancelled that order and would like to pick up rx later today at pharm. 30 or 90 day at [facility] is OK, doesn't matter.

## 2012-11-12 ENCOUNTER — Other Ambulatory Visit: Payer: Self-pay | Admitting: *Deleted

## 2012-11-12 LAB — URINE CULTURE: Colony Count: >=100000

## 2012-11-12 MED ORDER — CIPROFLOXACIN HCL 500 MG PO TABS: 500.0000 mg | ORAL_TABLET | Freq: Two times a day (BID) | ORAL | Status: DC

## 2012-11-12 NOTE — Progress Notes (Signed)
Quick Note:  Pt wife informed, Rx sent ______

## 2012-12-07 ENCOUNTER — Other Ambulatory Visit: Payer: Self-pay | Admitting: *Deleted

## 2012-12-07 MED ORDER — LISINOPRIL-HYDROCHLOROTHIAZIDE 20-12.5 MG PO TABS: 1.0000 | ORAL_TABLET | Freq: Every day | ORAL | Status: DC

## 2012-12-07 MED ORDER — TRAZODONE HCL 100 MG PO TABS: 100.0000 mg | ORAL_TABLET | Freq: Every day | ORAL | Status: DC

## 2012-12-07 MED ORDER — ATORVASTATIN CALCIUM 20 MG PO TABS: 20.0000 mg | ORAL_TABLET | Freq: Every day | ORAL | Status: DC

## 2012-12-07 MED ORDER — OXYBUTYNIN CHLORIDE 5 MG PO TABS: 5.0000 mg | ORAL_TABLET | Freq: Two times a day (BID) | ORAL | Status: DC

## 2012-12-07 MED ORDER — CLOPIDOGREL BISULFATE 75 MG PO TABS: 75.0000 mg | ORAL_TABLET | Freq: Every day | ORAL | Status: DC

## 2012-12-07 MED ORDER — ESCITALOPRAM OXALATE 10 MG PO TABS: 10.0000 mg | ORAL_TABLET | Freq: Every day | ORAL | Status: DC

## 2013-01-10 ENCOUNTER — Ambulatory Visit (INDEPENDENT_AMBULATORY_CARE_PROVIDER_SITE_OTHER): Payer: Medicare Other | Admitting: Family Medicine

## 2013-01-10 ENCOUNTER — Encounter: Payer: Self-pay | Admitting: Family Medicine

## 2013-01-10 VITALS — BP 160/90 | HR 88 | Temp 97.9°F | Resp 20

## 2013-01-10 DIAGNOSIS — E119 Type 2 diabetes mellitus without complications: Secondary | ICD-10-CM

## 2013-01-10 DIAGNOSIS — L853 Xerosis cutis: Secondary | ICD-10-CM

## 2013-01-10 DIAGNOSIS — L738 Other specified follicular disorders: Secondary | ICD-10-CM

## 2013-01-10 DIAGNOSIS — I10 Essential (primary) hypertension: Secondary | ICD-10-CM

## 2013-01-10 NOTE — Progress Notes (Signed)
Subjective:    Patient ID: Austin [name], male    DOB: [birth year], 57 y.o.   MRN: [id number]  HPI  Patient here for medical followup. Complex past medical history. Past history of cerebellar CVA and pituitary tumor. He has history of type 2 diabetes, hypertension, renal cell carcinoma, neurogenic bladder, low testosterone. Recent A1c 7.6%. He has made dietary modifications. We increased metformin to 500 mg twice a day. Tolerating well. No symptoms of hyperglycemia  Hypertension which is been elevated recently. We titrated his lisinopril HCTZ. Blood pressure very well controlled by home readings.  Wife notices he's had recently some dry skin. Increased malaise. Recent testosterone levels borderline Low.  Past Medical History  Diagnosis Date  . Hx of colonic polyps   . Hypertension   . Pituitary adenoma   . Urinary incontinence   . Venous stasis     edema  . 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 Hypogonadism   . Neuromuscular disorder     quadraplegic  . Chronic kidney disease     Left Renal Mass  . Sleep apnea     stopbang=4  . Cerebrovascular accident 42    hx of brainstem stroke, residual diminished lung capacity   Past Surgical History  Procedure Laterality Date  . Tracheostomy tube placement  02/1993    removed 04/1993  . Stomach peg  02/1993    removed 5-6 years later  . Cholecystectomy    . Pituitary surgery  2007, 2011  . Gamma knife radiation surgery  05/2010    for pituitary  . Colonoscopy  02/27/2012    Procedure: COLONOSCOPY;  Surgeon: Beverley Fiedler, MD;  Location: [facility];  Service: Gastroenterology;  Laterality: N/A;  . Vocal cord surgery      injected with collagen, then fat from stomach to improve speech s/p stroke  . Vasectomy  1984  . Robotic partial nephrectomy  04-23-12    left   Left Renal Mass  . Robot assisted laparoscopic nephrectomy  04/23/2012    Procedure: ROBOTIC ASSISTED LAPAROSCOPIC NEPHRECTOMY;   Surgeon: Sebastian Ache, MD;  Location: [facility];  Service: Urology;  Laterality: Left;  radical  . Umbilical hernia repair  04/23/2012    Procedure: HERNIA REPAIR UMBILICAL ADULT;  Surgeon: Sebastian Ache, MD;  Location: [facility];  Service: Urology;;    reports that he quit smoking about 20 years ago. His smoking use included Cigarettes. He has a 11.5 pack-year smoking history. He has never used smokeless tobacco. He reports that he does not drink alcohol or use illicit drugs. family history includes Alcohol abuse in his fathers; Arthritis in his maternal grandmother and mother; Colon cancer in his brother; Esophageal cancer (age of onset: 62) in his father; Hyperlipidemia in his mother; Hypertension in his brother; Stroke in his paternal grandmother; Throat cancer in his father; and Uterine cancer (age of onset: 29) in his mother. Allergies  Allergen Reactions  . Penicillins     REACTION: rash  '    Review of Systems  Constitutional: Positive for fatigue. Negative for fever and chills.  Respiratory: Negative for cough and shortness of breath.   Cardiovascular: Negative for chest pain.       Objective:   Physical Exam  Constitutional: He appears well-developed and well-nourished.  HENT:  Mouth/Throat: Oropharynx is clear and moist.  Cardiovascular: Normal rate and regular rhythm.   Pulmonary/Chest: Effort normal and breath sounds normal. No respiratory distress. He has no wheezes. He  has no rales.  Musculoskeletal: He exhibits edema.          Assessment & Plan    #1 type 2 diabetes. Recent A1c 7.6%. We increased metformin. Future labs order with A1c #2 hypertension. Possible whitecoat syndrome. Elevation here today but consistently controlled by home readings. This also confirmed by visiting nurse. Wife states his blood pressure readings are consistently less than 130/80. #3 Dry skin.  Needs TSH at follow up.  Try moisturizers in meantime.

## 2013-02-10 ENCOUNTER — Other Ambulatory Visit: Payer: Self-pay

## 2013-02-10 MED ORDER — KETOCONAZOLE 2 % EX CREA: 1.0000 "application " | TOPICAL_CREAM | CUTANEOUS | Status: DC | PRN

## 2013-02-24 ENCOUNTER — Other Ambulatory Visit (INDEPENDENT_AMBULATORY_CARE_PROVIDER_SITE_OTHER): Payer: Medicare Other

## 2013-02-24 DIAGNOSIS — L853 Xerosis cutis: Secondary | ICD-10-CM

## 2013-02-24 DIAGNOSIS — E119 Type 2 diabetes mellitus without complications: Secondary | ICD-10-CM

## 2013-02-24 DIAGNOSIS — I10 Essential (primary) hypertension: Secondary | ICD-10-CM

## 2013-02-24 DIAGNOSIS — L738 Other specified follicular disorders: Secondary | ICD-10-CM

## 2013-02-24 LAB — HEMOGLOBIN A1C: Hgb A1c MFr Bld: 6.6 % — ABNORMAL HIGH (ref 4.6–6.5)

## 2013-02-25 ENCOUNTER — Telehealth: Payer: Self-pay

## 2013-02-25 LAB — BASIC METABOLIC PANEL
Chloride: 97 mEq/L (ref 96–112)
GFR: 65.37 mL/min (ref >60.00)
Potassium: 4.2 mEq/L (ref 3.5–5.1)
Sodium: 130 mEq/L — ABNORMAL LOW (ref 135–145)

## 2013-02-25 LAB — TSH: TSH: 1.68 u[IU]/mL (ref 0.35–5.50)

## 2013-02-25 MED ORDER — NITROFURANTOIN MONOHYD MACRO 100 MG PO CAPS: 100.0000 mg | ORAL_CAPSULE | Freq: Every evening | ORAL | Status: DC

## 2013-02-25 NOTE — Telephone Encounter (Signed)
Refilled sent to [facility].

## 2013-02-25 NOTE — Telephone Encounter (Signed)
Confirm this is Nitrofurantoin.  If so, OK to refill for 6 months.

## 2013-02-25 NOTE — Telephone Encounter (Signed)
Nitrofur Mac cap 100 mg Take 1 by mouth daily  01/10/13 last visit  [facility]

## 2013-03-02 ENCOUNTER — Other Ambulatory Visit: Payer: Self-pay | Admitting: Family Medicine

## 2013-03-02 NOTE — Telephone Encounter (Signed)
Refill OK

## 2013-04-13 ENCOUNTER — Ambulatory Visit (INDEPENDENT_AMBULATORY_CARE_PROVIDER_SITE_OTHER): Payer: Medicare Other | Admitting: Family Medicine

## 2013-04-13 ENCOUNTER — Encounter: Payer: Self-pay | Admitting: Family Medicine

## 2013-04-13 VITALS — BP 140/72 | HR 85 | Temp 98.2°F

## 2013-04-13 DIAGNOSIS — Z23 Encounter for immunization: Secondary | ICD-10-CM

## 2013-04-13 DIAGNOSIS — R5381 Other malaise: Secondary | ICD-10-CM

## 2013-04-13 DIAGNOSIS — I10 Essential (primary) hypertension: Secondary | ICD-10-CM

## 2013-04-13 DIAGNOSIS — R002 Palpitations: Secondary | ICD-10-CM

## 2013-04-13 DIAGNOSIS — R5383 Other fatigue: Secondary | ICD-10-CM

## 2013-04-13 DIAGNOSIS — E119 Type 2 diabetes mellitus without complications: Secondary | ICD-10-CM

## 2013-04-13 MED ORDER — FREESTYLE LANCETS MISC: Status: DC

## 2013-04-13 NOTE — Progress Notes (Signed)
Subjective:    Patient ID: Austin [name], male    DOB: 05-20-1956, 57 y.o.   MRN: [id number]  HPI Patient seen for medical followup He has multiple chronic problems including history of CVA, pituitary tumor, renal cell carcinoma, type 2 diabetes, neurogenic bladder, hypogonadism, hypertension, chronic lower extremity edema  Type 2 diabetes. Recent A1c 6.6% which was improved. No symptoms of polyuria or polydipsia. Compliant with diet. Currently treated with metformin. He is on ACE inhibitor with lisinopril so urine microalbumin is not being screening.  Patient continues to complain of some generalized fatigue. Very nonspecific symptoms. He is on testosterone replacement which has helped only minimally. Recent TSH was normal. Recent electrolytes revealed slightly low sodium but no other electrolyte abnormalities.  New complaint of some intermittent palpitations for the past couple months. He only recently mentioned this to his wife. He states most days he has episodes of palpitations and possible tachyarrhythmia. Can last anywhere from minutes to one hour. Symptoms followed by dizziness. Never syncope. No chest pains. These do not occur at any particular time of day. No clear exacerbating factors.  Lab Results  Component Value Date   HGBA1C 6.6* 02/24/2013     Past Medical History  Diagnosis Date  . Hx of colonic polyps   . Hypertension   . Pituitary adenoma   . Urinary incontinence   . Venous stasis     edema  . 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 Hypogonadism   . Neuromuscular disorder     quadraplegic  . Chronic kidney disease     Left Renal Mass  . Sleep apnea     stopbang=4  . Cerebrovascular accident 7    hx of brainstem stroke, residual diminished lung capacity   Past Surgical History  Procedure Laterality Date  . Tracheostomy tube placement  02/1993    removed 04/1993  . Stomach peg  02/1993    removed 5-6 years later  .  Cholecystectomy    . Pituitary surgery  2007, 2011  . Gamma knife radiation surgery  05/2010    for pituitary  . Colonoscopy  02/27/2012    Procedure: COLONOSCOPY;  Surgeon: Beverley Fiedler, MD;  Location: [facility];  Service: Gastroenterology;  Laterality: N/A;  . Vocal cord surgery      injected with collagen, then fat from stomach to improve speech s/p stroke  . Vasectomy  1984  . Robotic partial nephrectomy  04-23-12    left   Left Renal Mass  . Robot assisted laparoscopic nephrectomy  04/23/2012    Procedure: ROBOTIC ASSISTED LAPAROSCOPIC NEPHRECTOMY;  Surgeon: Sebastian Ache, MD;  Location: [facility];  Service: Urology;  Laterality: Left;  radical  . Umbilical hernia repair  04/23/2012    Procedure: HERNIA REPAIR UMBILICAL ADULT;  Surgeon: Sebastian Ache, MD;  Location: [facility];  Service: Urology;;    reports that he quit smoking about 20 years ago. His smoking use included Cigarettes. He has a 11.5 pack-year smoking history. He has never used smokeless tobacco. He reports that he does not drink alcohol or use illicit drugs. family history includes Alcohol abuse in his father and father; Arthritis in his maternal grandmother and mother; Colon cancer in his brother; Esophageal cancer (age of onset: 38) in his father; Hyperlipidemia in his mother; Hypertension in his brother; Stroke in his paternal grandmother; Throat cancer in his father; Uterine cancer (age of onset: 59) in his mother. Allergies  Allergen Reactions  . Penicillins  REACTION: rash      Review of Systems  Constitutional: Positive for fatigue. Negative for fever, diaphoresis, appetite change and unexpected weight change.  HENT: Negative for trouble swallowing.   Eyes: Negative for visual disturbance.  Respiratory: Negative for cough and shortness of breath.   Cardiovascular: Positive for palpitations. Negative for chest pain.  Gastrointestinal: Negative for nausea, vomiting and abdominal pain.  Endocrine: Negative for  polydipsia and polyuria.  Neurological: Positive for dizziness. Negative for seizures and syncope.  Psychiatric/Behavioral: Negative for confusion.       Objective:   Physical Exam  Constitutional: He is oriented to person, place, and time. He appears well-developed and well-nourished.  Neck: Neck supple. No thyromegaly present.  Cardiovascular: Normal rate and regular rhythm.  Exam reveals no gallop.   Pulmonary/Chest: Effort normal and breath sounds normal. No respiratory distress. He has no wheezes. He has no rales.  Musculoskeletal: He exhibits edema.  Trace edema legs bilaterally which is chronic  Neurological: He is alert and oriented to person, place, and time.  Psychiatric: He has a normal mood and affect. His behavior is normal.          Assessment & Plan:  #1 type 2 diabetes. Adequately controlled. Continue regular eye exams. Recheck A1c in 3 months #2 fatigue. Likely multifactorial. He is on testosterone replacement. Recent TSH normal. Suspect to a large degree this is related to his sedentary lifestyle. #3 recent tachycardia palpitations. Sometimes associated with dizziness but never syncope. Given frequency of the events set up event monitor. #4 hypertension. Adequately controlled by home readings. #5 health maintenance. Flu vaccine given. Wife states he had previous Pneumovax estimated 9 years ago

## 2013-04-18 ENCOUNTER — Encounter: Payer: Self-pay | Admitting: *Deleted

## 2013-04-18 ENCOUNTER — Encounter (INDEPENDENT_AMBULATORY_CARE_PROVIDER_SITE_OTHER): Payer: Medicare Other

## 2013-04-18 DIAGNOSIS — R42 Dizziness and giddiness: Secondary | ICD-10-CM

## 2013-04-18 DIAGNOSIS — R002 Palpitations: Secondary | ICD-10-CM

## 2013-04-18 NOTE — Progress Notes (Signed)
Patient ID: Austin [name], male   DOB: 04/05/1956, 57 y.o.   MRN: [id number] E-Cardio verite 30 day cardiac event monitor applied to patient.

## 2013-05-10 ENCOUNTER — Telehealth: Payer: Self-pay | Admitting: Family Medicine

## 2013-05-10 NOTE — Telephone Encounter (Signed)
Pt needs rx to have his wheel chair repair fax to [facility] [phone]

## 2013-05-10 NOTE — Telephone Encounter (Signed)
OK to send in .  I guess we would just need to write on prescription pad and send in.

## 2013-05-11 NOTE — Telephone Encounter (Signed)
Faxed over to [facility]

## 2013-05-18 ENCOUNTER — Ambulatory Visit (HOSPITAL_COMMUNITY)
Admission: RE | Admit: 2013-05-18 | Discharge: 2013-05-18 | Disposition: A | Discharge status: Home / Self Care | Payer: Medicare Other | Source: Ambulatory Visit | Attending: Urology | Admitting: Urology

## 2013-05-18 ENCOUNTER — Other Ambulatory Visit (HOSPITAL_COMMUNITY): Payer: Self-pay | Admitting: Urology

## 2013-05-18 DIAGNOSIS — J984 Other disorders of lung: Secondary | ICD-10-CM | POA: Insufficient documentation

## 2013-05-18 DIAGNOSIS — C649 Malignant neoplasm of unspecified kidney, except renal pelvis: Secondary | ICD-10-CM

## 2013-06-13 ENCOUNTER — Ambulatory Visit (INDEPENDENT_AMBULATORY_CARE_PROVIDER_SITE_OTHER): Payer: Medicare Other | Admitting: Family Medicine

## 2013-06-13 ENCOUNTER — Encounter: Payer: Self-pay | Admitting: Family Medicine

## 2013-06-13 VITALS — BP 132/70 | HR 89 | Temp 97.5°F

## 2013-06-13 DIAGNOSIS — R05 Cough: Secondary | ICD-10-CM

## 2013-06-13 DIAGNOSIS — R509 Fever, unspecified: Secondary | ICD-10-CM

## 2013-06-13 MED ORDER — LEVOFLOXACIN 500 MG PO TABS: 500.0000 mg | ORAL_TABLET | Freq: Every day | ORAL | Status: DC

## 2013-06-13 NOTE — Progress Notes (Signed)
Subjective:    Patient ID: Austin [name], male    DOB: 01-22-1956, 57 y.o.   MRN: [id number]  HPI Acute visit for febrile illness. Temperature this morning 102.4. He's had some mild sore throat. Mild nasal congestion. Cough with onset couple days ago. Wife relates last week he was eating an apple and had some mild choking. Fever started this morning. Reduced with over-the-counter analgesics. Denies any chills. No nausea or vomiting. No dysuria. Nonsmoker. This morning he seemed to have some mild dyspnea but none at this time. No chest pains. No abdominal pain No skin rashes.  Does in and out caths.  Urine not cloudy.   No headache.  Does have some localized right maxillary congestion.  Past Medical History  Diagnosis Date  . Hx of colonic polyps   . Hypertension   . Pituitary adenoma   . Urinary incontinence   . Venous stasis     edema  . 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 Hypogonadism   . Neuromuscular disorder     quadraplegic  . Chronic kidney disease     Left Renal Mass  . Sleep apnea     stopbang=4  . Cerebrovascular accident 37    hx of brainstem stroke, residual diminished lung capacity   Past Surgical History  Procedure Laterality Date  . Tracheostomy tube placement  02/1993    removed 04/1993  . Stomach peg  02/1993    removed 5-6 years later  . Cholecystectomy    . Pituitary surgery  2007, 2011  . Gamma knife radiation surgery  05/2010    for pituitary  . Colonoscopy  02/27/2012    Procedure: COLONOSCOPY;  Surgeon: Beverley Fiedler, MD;  Location: [facility];  Service: Gastroenterology;  Laterality: N/A;  . Vocal cord surgery      injected with collagen, then fat from stomach to improve speech s/p stroke  . Vasectomy  1984  . Robotic partial nephrectomy  04-23-12    left   Left Renal Mass  . Robot assisted laparoscopic nephrectomy  04/23/2012    Procedure: ROBOTIC ASSISTED LAPAROSCOPIC NEPHRECTOMY;  Surgeon: Sebastian Ache, MD;   Location: [facility];  Service: Urology;  Laterality: Left;  radical  . Umbilical hernia repair  04/23/2012    Procedure: HERNIA REPAIR UMBILICAL ADULT;  Surgeon: Sebastian Ache, MD;  Location: [facility];  Service: Urology;;    reports that he quit smoking about 20 years ago. His smoking use included Cigarettes. He has a 11.5 pack-year smoking history. He has never used smokeless tobacco. He reports that he does not drink alcohol or use illicit drugs. family history includes Alcohol abuse in his father and father; Arthritis in his maternal grandmother and mother; Colon cancer in his brother; Esophageal cancer (age of onset: 35) in his father; Hyperlipidemia in his mother; Hypertension in his brother; Stroke in his paternal grandmother; Throat cancer in his father; Uterine cancer (age of onset: 31) in his mother. Allergies  Allergen Reactions  . Penicillins     REACTION: rash      Review of Systems  Constitutional: Positive for fever, chills and fatigue.  HENT: Positive for sore throat.   Respiratory: Positive for cough. Negative for wheezing.   Cardiovascular: Negative for chest pain and leg swelling.  Gastrointestinal: Negative for abdominal pain.  Skin: Negative for rash.  Neurological: Negative for dizziness and headaches.  Hematological: Negative for adenopathy.       Objective:   Physical Exam  Constitutional: He is oriented to person, place, and time. He appears well-developed and well-nourished.  HENT:  Right Ear: External ear normal.  Left Ear: External ear normal.  Mouth/Throat: Oropharynx is clear and moist.  Neck: Neck supple.  Cardiovascular: Normal rate.   Pulmonary/Chest:  No wheezes. Faint crackles right base only. No tachypnea. No signs of respiratory distress. Respiratory rate is normal. No retractions  Neurological: He is alert and oriented to person, place, and time.          Assessment & Plan:  Acute fever with cough. Clinically, suspect possible early right  lower lobe pneumonia. No signs respiratory distress. Pulse oximetry 98%. Start Levaquin 500 milligrams once daily for 10 days. Followup immediately for any increased dyspnea, vomiting, or worsening symptoms. He has scheduled follow up soon.

## 2013-06-13 NOTE — Patient Instructions (Signed)
Follow up if fever not resolving in 2-3 days Follow up sooner for increased shortness of breath, vomiting, confusion or any other new acute problems.

## 2013-06-29 ENCOUNTER — Telehealth: Payer: Self-pay | Admitting: Family Medicine

## 2013-06-29 NOTE — Telephone Encounter (Signed)
Patient Information:  Caller Name: Malachi Bonds  Phone: [phone]  Patient: Austin [name], Austin [name]  Gender: Male  DOB: 12-17-1955  Age: 57 Years  PCP: Evelena Peat [facility]  Office Follow Up:  Does the office need to follow up with this patient?: No  Instructions For The Office: N/A  RN Note:  Quadriplegic. Not currently using humidifier. Reviewed nostril blowing technique and use of saline nasal spray. Hydrate and humidify.  Declined to schedule appointment now; has follow up appointment for 07/12/13.  Will moniter symptoms and call for appointment if decides to be seen sooner or if symtpoms worsen.  Symptoms  Reason For Call & Symptoms: Ongoing cold symptoms with clear to white rhinorrhea and intermittent, mild left nostril epistaxis.  Last nosebleed 06/25/13.  BP 132/76. Finished 10 days of Levaquin for pneumonia.  Continues to have mild productive cough with minimal yellow phlem. FBS not tested.   Reviewed Health History In EMR: Yes  Reviewed Medications In EMR: Yes  Reviewed Allergies In EMR: Yes  Reviewed Surgeries / Procedures: Yes  Date of Onset of Symptoms: 06/11/2013  Treatments Tried: Robitussin,Tylenol Sinus  Treatments Tried Worked: Yes  Guideline(s) Used:  Colds  Cough  Disposition Per Guideline:   See Today or Tomorrow in Office  Reason For Disposition Reached:   Sinus congestion (pressure, fullness) present > 10 days  Advice Given:  For a Runny Nose With Profuse Discharge:   Nasal mucus and discharge helps to wash viruses and bacteria out of the nose and sinuses.  If the skin around your nostrils gets irritated, apply a tiny amount of petroleum ointment to the nasal openings once or twice a day.  For a Stuffy Nose - Use Nasal Washes:  Introduction: Saline (salt water) nasal irrigation (nasal wash) is an effective and simple home remedy for treating stuffy nose and sinus congestion. The nose can be irrigated by pouring, spraying, or squirting salt water  into the nose and then letting it run back out.  How it Helps: The salt water rinses out excess mucus, washes out any irritants (dust, allergens) that might be present, and moistens the nasal cavity.  Humidifier:  If the air in your home is dry, use a cool-mist humidifier  Expected Course:   Nasal discharge 7-14 days  Cough up to 2-3 weeks.  Call Back If:  Difficulty breathing occurs  Nasal discharge lasts more than 10 days  Cough lasts more than 3 weeks  You become worse  Patient Refused Recommendation:  Patient Will Follow Up With Office Later  Declined to schedule appointment within 24 hours.

## 2013-07-04 ENCOUNTER — Other Ambulatory Visit: Payer: Self-pay

## 2013-07-04 MED ORDER — NITROFURANTOIN MONOHYD MACRO 100 MG PO CAPS: 100.0000 mg | ORAL_CAPSULE | Freq: Every evening | ORAL | Status: DC

## 2013-07-12 ENCOUNTER — Encounter: Payer: Self-pay | Admitting: Family Medicine

## 2013-07-12 ENCOUNTER — Ambulatory Visit (INDEPENDENT_AMBULATORY_CARE_PROVIDER_SITE_OTHER): Payer: Medicare Other | Admitting: Family Medicine

## 2013-07-12 VITALS — BP 132/76 | HR 79 | Temp 97.7°F

## 2013-07-12 DIAGNOSIS — J329 Chronic sinusitis, unspecified: Secondary | ICD-10-CM

## 2013-07-12 DIAGNOSIS — R5383 Other fatigue: Secondary | ICD-10-CM

## 2013-07-12 DIAGNOSIS — R5381 Other malaise: Secondary | ICD-10-CM

## 2013-07-12 DIAGNOSIS — E871 Hypo-osmolality and hyponatremia: Secondary | ICD-10-CM

## 2013-07-12 DIAGNOSIS — E119 Type 2 diabetes mellitus without complications: Secondary | ICD-10-CM

## 2013-07-12 LAB — HM DIABETES FOOT EXAM: HM Diabetic Foot Exam: NORMAL

## 2013-07-12 LAB — HM DIABETES EYE EXAM

## 2013-07-12 MED ORDER — LEVOFLOXACIN 500 MG PO TABS: 500.0000 mg | ORAL_TABLET | Freq: Every day | ORAL | Status: DC

## 2013-07-12 NOTE — Progress Notes (Signed)
Pre visit review using our clinic review tool, if applicable. No additional management support is needed unless otherwise documented below in the visit note. 

## 2013-07-12 NOTE — Progress Notes (Signed)
Subjective:    Patient ID: Austin [name], male    DOB: [birth year], 57 y.o.   MRN: [id number]  HPI Patient has multiple chronic problems including history of type 2 diabetes, hypertension, history of renal cell cancer, history of brainstem stroke, history of pituitary neoplasm. He seen today with chief complaint of pervasive fatigue since last visit. He was seen in early November and treated for possible early community-acquired pneumonia and acute sinusitis symptoms. We treated with Levaquin 500 mg daily for 10 days and he did feel better briefly afterwards.  According to his wife, his had some thick, purulent nasal secretions especially from right nostril the past few weeks. Occasional bloody nasal discharge. No headaches. No fevers or chills. Occasional dry cough.  He has somewhat chronic pervasive fatigue. He has hypogonadism and has been on testosterone replacement. He's had history of mild hyponatremia and takes lisinopril HCTZ. Appetite is fair. No chest pains. No recent weight loss.  Type 2 diabetes. Last A1c 6.6%. Fasting blood sugars stable.  Past Medical History  Diagnosis Date  . Hx of colonic polyps   . Hypertension   . Pituitary adenoma   . Urinary incontinence   . Venous stasis     edema  . 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 Hypogonadism   . Neuromuscular disorder     quadraplegic  . Chronic kidney disease     Left Renal Mass  . Sleep apnea     stopbang=4  . Cerebrovascular accident 70    hx of brainstem stroke, residual diminished lung capacity   Past Surgical History  Procedure Laterality Date  . Tracheostomy tube placement  02/1993    removed 04/1993  . Stomach peg  02/1993    removed 5-6 years later  . Cholecystectomy    . Pituitary surgery  2007, 2011  . Gamma knife radiation surgery  05/2010    for pituitary  . Colonoscopy  02/27/2012    Procedure: COLONOSCOPY;  Surgeon: Beverley Fiedler, MD;  Location: [facility];   Service: Gastroenterology;  Laterality: N/A;  . Vocal cord surgery      injected with collagen, then fat from stomach to improve speech s/p stroke  . Vasectomy  1984  . Robotic partial nephrectomy  04-23-12    left   Left Renal Mass  . Robot assisted laparoscopic nephrectomy  04/23/2012    Procedure: ROBOTIC ASSISTED LAPAROSCOPIC NEPHRECTOMY;  Surgeon: Sebastian Ache, MD;  Location: [facility];  Service: Urology;  Laterality: Left;  radical  . Umbilical hernia repair  04/23/2012    Procedure: HERNIA REPAIR UMBILICAL ADULT;  Surgeon: Sebastian Ache, MD;  Location: [facility];  Service: Urology;;    reports that he quit smoking about 20 years ago. His smoking use included Cigarettes. He has a 11.5 pack-year smoking history. He has never used smokeless tobacco. He reports that he does not drink alcohol or use illicit drugs. family history includes Alcohol abuse in his father and father; Arthritis in his maternal grandmother and mother; Colon cancer in his brother; Esophageal cancer (age of onset: 31) in his father; Hyperlipidemia in his mother; Hypertension in his brother; Stroke in his paternal grandmother; Throat cancer in his father; Uterine cancer (age of onset: 73) in his mother. Allergies  Allergen Reactions  . Penicillins     REACTION: rash      Review of Systems  Constitutional: Positive for fatigue. Negative for fever, chills and unexpected weight change.  HENT: Positive  for congestion and sinus pressure. Negative for sore throat.   Respiratory: Positive for cough. Negative for shortness of breath and wheezing.   Cardiovascular: Negative for chest pain.  Gastrointestinal: Negative for nausea, vomiting and abdominal pain.  Neurological: Negative for dizziness and headaches.       Objective:   Physical Exam  Constitutional: He is oriented to person, place, and time. He appears well-developed and well-nourished.  HENT:  Right Ear: External ear normal.  Left Ear: External ear normal.    Mouth/Throat: Oropharynx is clear and moist.  Thick yellow mucous right naris  Neck: Neck supple.  Cardiovascular: Normal rate.   Pulmonary/Chest: Effort normal and breath sounds normal. No respiratory distress. He has no wheezes. He has no rales.  Lymphadenopathy:    He has no cervical adenopathy.  Neurological: He is alert and oriented to person, place, and time.          Assessment & Plan:  #1 probable acute/recurrent right maxillary and frontal sinusitis. Repeat course of Levaquin 500 mg once daily for 10 days. Consider CT of sinuses if not resolving after this treatment. Suggested over-the-counter Mucinex and try saline nasal irrigation #2 fatigue probably related to #1. He has history of mild hyponatremia. Recheck basic metabolic panel. #3 type 2 diabetes. History of good control. Recheck A1c

## 2013-07-13 LAB — BASIC METABOLIC PANEL
Calcium: 8.9 mg/dL (ref 8.4–10.5)
Chloride: 99 mEq/L (ref 96–112)
GFR: 77.75 mL/min (ref >60.00)
Glucose, Bld: 137 mg/dL — ABNORMAL HIGH (ref 70–99)
Sodium: 131 mEq/L — ABNORMAL LOW (ref 135–145)

## 2013-07-13 LAB — HEMOGLOBIN A1C: Hgb A1c MFr Bld: 6.7 % — ABNORMAL HIGH (ref 4.6–6.5)

## 2013-10-05 ENCOUNTER — Telehealth: Payer: Self-pay | Admitting: Family Medicine

## 2013-10-05 MED ORDER — METFORMIN HCL 500 MG PO TABS: 500.0000 mg | ORAL_TABLET | Freq: Two times a day (BID) | ORAL | Status: DC

## 2013-10-05 NOTE — Telephone Encounter (Signed)
Rx sent to pharmacy   

## 2013-10-05 NOTE — Telephone Encounter (Signed)
[facility] requesting script for metFORMIN (GLUCOPHAGE) 500 MG tablet

## 2013-10-06 ENCOUNTER — Ambulatory Visit: Payer: Medicare Other | Admitting: Family Medicine

## 2013-10-06 ENCOUNTER — Telehealth: Payer: Self-pay | Admitting: Family Medicine

## 2013-10-06 NOTE — Telephone Encounter (Signed)
[facility] requesting refills of the following:  traZODone (DESYREL) 100 MG tablet lisinopril-hydrochlorothiazide (ZESTORETIC) 20-12.5 MG per tablet atorvastatin (LIPITOR) 20 MG tablet

## 2013-10-07 MED ORDER — ATORVASTATIN CALCIUM 20 MG PO TABS
20.0000 mg | ORAL_TABLET | Freq: Every day | ORAL | Status: DC
Start: 2013-10-07 — End: 2013-10-17

## 2013-10-07 MED ORDER — LISINOPRIL-HYDROCHLOROTHIAZIDE 20-12.5 MG PO TABS: 1.0000 | ORAL_TABLET | Freq: Every day | ORAL | Status: DC

## 2013-10-07 MED ORDER — TRAZODONE HCL 100 MG PO TABS: 100.0000 mg | ORAL_TABLET | Freq: Every day | ORAL | Status: DC

## 2013-10-07 NOTE — Telephone Encounter (Signed)
All Rx's sent to [facility]

## 2013-10-17 ENCOUNTER — Ambulatory Visit (INDEPENDENT_AMBULATORY_CARE_PROVIDER_SITE_OTHER): Payer: Commercial Managed Care - HMO | Admitting: Family Medicine

## 2013-10-17 ENCOUNTER — Encounter: Payer: Self-pay | Admitting: Family Medicine

## 2013-10-17 VITALS — BP 140/70 | HR 95

## 2013-10-17 DIAGNOSIS — I10 Essential (primary) hypertension: Secondary | ICD-10-CM

## 2013-10-17 DIAGNOSIS — M7072 Other bursitis of hip, left hip: Secondary | ICD-10-CM

## 2013-10-17 DIAGNOSIS — C649 Malignant neoplasm of unspecified kidney, except renal pelvis: Secondary | ICD-10-CM

## 2013-10-17 DIAGNOSIS — E785 Hyperlipidemia, unspecified: Secondary | ICD-10-CM | POA: Insufficient documentation

## 2013-10-17 DIAGNOSIS — M76899 Other specified enthesopathies of unspecified lower limb, excluding foot: Secondary | ICD-10-CM

## 2013-10-17 DIAGNOSIS — E119 Type 2 diabetes mellitus without complications: Secondary | ICD-10-CM

## 2013-10-17 DIAGNOSIS — N319 Neuromuscular dysfunction of bladder, unspecified: Secondary | ICD-10-CM

## 2013-10-17 DIAGNOSIS — E291 Testicular hypofunction: Secondary | ICD-10-CM

## 2013-10-17 MED ORDER — ATORVASTATIN CALCIUM 20 MG PO TABS: 20.0000 mg | ORAL_TABLET | Freq: Every day | ORAL | Status: DC

## 2013-10-17 MED ORDER — ESCITALOPRAM OXALATE 10 MG PO TABS: 10.0000 mg | ORAL_TABLET | Freq: Every day | ORAL | Status: DC

## 2013-10-17 NOTE — Progress Notes (Signed)
Pre visit review using our clinic review tool, if applicable. No additional management support is needed unless otherwise documented below in the visit note. 

## 2013-10-17 NOTE — Progress Notes (Signed)
Subjective:    Patient ID: Austin [name], male    DOB: 1956-03-28, 58 y.o.   MRN: [id number]  HPI Patient here for medical followup. He has history of brainstem stroke several years ago, neurogenic bladder, type 2 diabetes, history of renal cell carcinoma, hyperlipidemia, history of colon polyps, history of pituitary tumor neoplasm, hypogonadism, hypertension. He is accompanied by his wife who is his primary caregiver. She needs refills of several medications. Medications reviewed.  They have specific questions about stopping a couple of medications. Takes oxybutynin but has frequent dry mouth and fatigue complaints. Also has frequent constipation issues. Wife does in and out catheter twice daily and is using condom-type catheter during the day. They would like to try discontinuing oxybutynin.  Type 2 diabetes. Last A1c 6.7%. Blood sugars run 120 fasting in stable. Remains on metformin. Renal function has been stable. History of renal cell carcinoma. Needs urology followup. No recent gross hematuria  Left lateral hip pain. No injury. Location is lateral bursa region. Did not try icing. Several years ago had steroid injection in this region which helped.  Hypogonadism on testosterone replacement. No recent PSA. Also no recent CBC or hepatic function.  Past Medical History  Diagnosis Date  . Hx of colonic polyps   . Hypertension   . Pituitary adenoma   . Urinary incontinence   . Venous stasis     edema  . 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 Hypogonadism   . Neuromuscular disorder     quadraplegic  . Chronic kidney disease     Left Renal Mass  . Sleep apnea     stopbang=4  . Cerebrovascular accident 52    hx of brainstem stroke, residual diminished lung capacity   Past Surgical History  Procedure Laterality Date  . Tracheostomy tube placement  02/1993    removed 04/1993  . Stomach peg  02/1993    removed 5-6 years later  . Cholecystectomy     . Pituitary surgery  2007, 2011  . Gamma knife radiation surgery  05/2010    for pituitary  . Colonoscopy  02/27/2012    Procedure: COLONOSCOPY;  Surgeon: Jerene Bears, MD;  Location: [facility];  Service: Gastroenterology;  Laterality: N/A;  . Vocal cord surgery      injected with collagen, then fat from stomach to improve speech s/p stroke  . Vasectomy  1984  . Robotic partial nephrectomy  04-23-12    left   Left Renal Mass  . Robot assisted laparoscopic nephrectomy  04/23/2012    Procedure: ROBOTIC ASSISTED LAPAROSCOPIC NEPHRECTOMY;  Surgeon: Alexis Frock, MD;  Location: [facility];  Service: Urology;  Laterality: Left;  radical  . Umbilical hernia repair  04/23/2012    Procedure: HERNIA REPAIR UMBILICAL ADULT;  Surgeon: Alexis Frock, MD;  Location: [facility];  Service: Urology;;    reports that he quit smoking about 21 years ago. His smoking use included Cigarettes. He has a 11.5 pack-year smoking history. He has never used smokeless tobacco. He reports that he does not drink alcohol or use illicit drugs. family history includes Alcohol abuse in his father and father; Arthritis in his maternal grandmother and mother; Colon cancer in his brother; Esophageal cancer (age of onset: 67) in his father; Hyperlipidemia in his mother; Hypertension in his brother; Stroke in his paternal grandmother; Throat cancer in his father; Uterine cancer (age of onset: 11) in his mother. Allergies  Allergen Reactions  . Penicillins  REACTION: rash      Review of Systems  Constitutional: Positive for fatigue.  Eyes: Negative for visual disturbance.  Respiratory: Negative for cough, chest tightness and shortness of breath.   Cardiovascular: Positive for leg swelling (Chronic and unchanged). Negative for chest pain and palpitations.  Gastrointestinal: Negative for abdominal pain.  Endocrine: Negative for polydipsia and polyuria.  Neurological: Negative for dizziness, syncope, weakness, light-headedness  and headaches.  Psychiatric/Behavioral: Negative for confusion.       Objective:   Physical Exam  Constitutional: He is oriented to person, place, and time. He appears well-developed and well-nourished.  Neck: Neck supple.  Cardiovascular: Normal rate.   Pulmonary/Chest: Effort normal and breath sounds normal. No respiratory distress. He has no wheezes. He has no rales.  Musculoskeletal: He exhibits edema.  Trace pitting edema legs bilaterally He has some tenderness over the left lateral hip region bursa region  Neurological: He is alert and oriented to person, place, and time. No cranial nerve deficit.          Assessment & Plan:  #1 type 2 diabetes. History of good control. Recheck A1c #2 left lateral hip pain. Suspect bursitis. We've recommended followup icing. Consider corticosteroid injection not improving with that  #3 hyperlipidemia. Check lipid and hepatic panel #4 hypogonadism on testosterone replacement. Check PSA, testosterone level, CBC, hepatic panel #5 hypertension which is stable #6 history of renal cell carcinoma. Requesting referral back to urologist for his routine followup #7 history of depression which is stable. Refill Lexapro for one year. He has had history of recurrent depression and we've recommended chronic therapy #8 intermittent constipation. Hopefully will improve if he is able to do without oxybutynin

## 2013-10-17 NOTE — Patient Instructions (Signed)
Ice left lateral hip 15-20 minutes 2-3 times daily Consider steroid injection if no better in 2-3 weeks with icing. Stop nitrofurantion and oxybutynin for now.

## 2013-10-18 ENCOUNTER — Telehealth: Payer: Self-pay | Admitting: Family Medicine

## 2013-10-18 LAB — CBC WITH DIFFERENTIAL/PLATELET
BASOS ABS: 0.1 10*3/uL (ref 0.0–0.1)
Basophils Relative: 0.6 % (ref 0.0–3.0)
Eosinophils Absolute: 0.3 10*3/uL (ref 0.0–0.7)
Eosinophils Relative: 1.8 % (ref 0.0–5.0)
HCT: 38.6 % — ABNORMAL LOW (ref 39.0–52.0)
Hemoglobin: 12.7 g/dL — ABNORMAL LOW (ref 13.0–17.0)
LYMPHS PCT: 8.7 % — AB (ref 12.0–46.0)
Lymphs Abs: 1.3 10*3/uL (ref 0.7–4.0)
MCHC: 33 g/dL (ref 30.0–36.0)
MCV: 88.5 fl (ref 78.0–100.0)
MONOS PCT: 7.3 % (ref 3.0–12.0)
Monocytes Absolute: 1.1 10*3/uL — ABNORMAL HIGH (ref 0.1–1.0)
NEUTROS PCT: 81.6 % — AB (ref 43.0–77.0)
Neutro Abs: 11.8 10*3/uL — ABNORMAL HIGH (ref 1.4–7.7)
Platelets: 315 10*3/uL (ref 150.0–400.0)
RBC: 4.36 Mil/uL (ref 4.22–5.81)
RDW: 16.5 % — ABNORMAL HIGH (ref 11.5–14.6)
WBC: 14.5 10*3/uL — ABNORMAL HIGH (ref 4.5–10.5)

## 2013-10-18 LAB — HEPATIC FUNCTION PANEL
ALK PHOS: 78 U/L (ref 39–117)
ALT: 25 U/L (ref 0–53)
AST: 18 U/L (ref 0–37)
Albumin: 3.4 g/dL — ABNORMAL LOW (ref 3.5–5.2)
BILIRUBIN DIRECT: 0.1 mg/dL (ref 0.0–0.3)
Total Bilirubin: 0.5 mg/dL (ref 0.3–1.2)
Total Protein: 6.8 g/dL (ref 6.0–8.3)

## 2013-10-18 LAB — BASIC METABOLIC PANEL
BUN: 17 mg/dL (ref 6–23)
CHLORIDE: 102 meq/L (ref 96–112)
CO2: 24 mEq/L (ref 19–32)
Calcium: 9.6 mg/dL (ref 8.4–10.5)
Creatinine, Ser: 1.1 mg/dL (ref 0.4–1.5)
GFR: 76.82 mL/min (ref >60.00)
GLUCOSE: 150 mg/dL — AB (ref 70–99)
Potassium: 4 mEq/L (ref 3.5–5.1)
Sodium: 137 mEq/L (ref 135–145)

## 2013-10-18 LAB — LIPID PANEL
CHOLESTEROL: 156 mg/dL (ref 0–200)
HDL: 41.3 mg/dL (ref >39.00)
LDL CALC: 51 mg/dL (ref 0–99)
Total CHOL/HDL Ratio: 4
Triglycerides: 317 mg/dL — ABNORMAL HIGH (ref 0.0–149.0)
VLDL: 63.4 mg/dL — AB (ref 0.0–40.0)

## 2013-10-18 LAB — HEMOGLOBIN A1C: Hgb A1c MFr Bld: 6.7 % — ABNORMAL HIGH (ref 4.6–6.5)

## 2013-10-18 LAB — PSA: PSA: 1.54 ng/mL (ref 0.10–4.00)

## 2013-10-18 LAB — TESTOSTERONE: Testosterone: 885.64 ng/dL (ref 350.00–890.00)

## 2013-10-18 NOTE — Telephone Encounter (Signed)
Relevant patient education assigned to patient using Emmi. ° °

## 2013-10-19 ENCOUNTER — Telehealth: Payer: Self-pay

## 2013-10-19 NOTE — Telephone Encounter (Signed)
Relevant patient education assigned to patient using Emmi. ° °

## 2013-10-31 ENCOUNTER — Ambulatory Visit: Payer: Commercial Managed Care - HMO

## 2013-10-31 ENCOUNTER — Ambulatory Visit: Payer: Commercial Managed Care - HMO | Admitting: Radiation Oncology

## 2013-11-18 ENCOUNTER — Other Ambulatory Visit (HOSPITAL_COMMUNITY): Payer: Self-pay | Admitting: Urology

## 2013-11-18 ENCOUNTER — Ambulatory Visit (HOSPITAL_COMMUNITY)
Admission: RE | Admit: 2013-11-18 | Discharge: 2013-11-18 | Disposition: A | Discharge status: Home / Self Care | Payer: Medicare HMO | Source: Ambulatory Visit | Attending: Urology | Admitting: Urology

## 2013-11-18 DIAGNOSIS — Z905 Acquired absence of kidney: Secondary | ICD-10-CM

## 2013-11-18 DIAGNOSIS — Z85528 Personal history of other malignant neoplasm of kidney: Secondary | ICD-10-CM | POA: Insufficient documentation

## 2013-11-21 ENCOUNTER — Other Ambulatory Visit (HOSPITAL_COMMUNITY): Payer: Self-pay | Admitting: Urology

## 2013-11-21 DIAGNOSIS — R1900 Intra-abdominal and pelvic swelling, mass and lump, unspecified site: Secondary | ICD-10-CM

## 2013-11-24 ENCOUNTER — Encounter (HOSPITAL_COMMUNITY): Payer: Self-pay | Admitting: Pharmacy Technician

## 2013-11-28 ENCOUNTER — Other Ambulatory Visit: Payer: Self-pay | Admitting: Radiology

## 2013-11-30 ENCOUNTER — Encounter (HOSPITAL_COMMUNITY): Payer: Self-pay

## 2013-11-30 ENCOUNTER — Ambulatory Visit (HOSPITAL_COMMUNITY)
Admission: RE | Admit: 2013-11-30 | Discharge: 2013-11-30 | Disposition: A | Discharge status: Home / Self Care | Payer: Medicare HMO | Source: Ambulatory Visit | Attending: Urology | Admitting: Urology

## 2013-11-30 DIAGNOSIS — I129 Hypertensive chronic kidney disease with stage 1 through stage 4 chronic kidney disease, or unspecified chronic kidney disease: Secondary | ICD-10-CM | POA: Insufficient documentation

## 2013-11-30 DIAGNOSIS — C786 Secondary malignant neoplasm of retroperitoneum and peritoneum: Secondary | ICD-10-CM | POA: Insufficient documentation

## 2013-11-30 DIAGNOSIS — C649 Malignant neoplasm of unspecified kidney, except renal pelvis: Secondary | ICD-10-CM | POA: Insufficient documentation

## 2013-11-30 DIAGNOSIS — Z862 Personal history of diseases of the blood and blood-forming organs and certain disorders involving the immune mechanism: Secondary | ICD-10-CM | POA: Insufficient documentation

## 2013-11-30 DIAGNOSIS — Z79899 Other long term (current) drug therapy: Secondary | ICD-10-CM | POA: Insufficient documentation

## 2013-11-30 DIAGNOSIS — Z8639 Personal history of other endocrine, nutritional and metabolic disease: Secondary | ICD-10-CM | POA: Insufficient documentation

## 2013-11-30 DIAGNOSIS — I69998 Other sequelae following unspecified cerebrovascular disease: Secondary | ICD-10-CM | POA: Insufficient documentation

## 2013-11-30 DIAGNOSIS — Z905 Acquired absence of kidney: Secondary | ICD-10-CM | POA: Insufficient documentation

## 2013-11-30 DIAGNOSIS — I69922 Dysarthria following unspecified cerebrovascular disease: Secondary | ICD-10-CM | POA: Insufficient documentation

## 2013-11-30 DIAGNOSIS — Z860100 Personal history of colon polyps, unspecified: Secondary | ICD-10-CM | POA: Insufficient documentation

## 2013-11-30 DIAGNOSIS — R1900 Intra-abdominal and pelvic swelling, mass and lump, unspecified site: Secondary | ICD-10-CM

## 2013-11-30 DIAGNOSIS — G8250 Quadriplegia, unspecified: Secondary | ICD-10-CM | POA: Insufficient documentation

## 2013-11-30 DIAGNOSIS — Z87891 Personal history of nicotine dependence: Secondary | ICD-10-CM | POA: Insufficient documentation

## 2013-11-30 DIAGNOSIS — N189 Chronic kidney disease, unspecified: Secondary | ICD-10-CM | POA: Insufficient documentation

## 2013-11-30 LAB — PROTIME-INR
INR: 1 (ref 0.00–1.49)
PROTHROMBIN TIME: 13 s (ref 11.6–15.2)

## 2013-11-30 LAB — CBC
HCT: 38 % — ABNORMAL LOW (ref 39.0–52.0)
Hemoglobin: 12.6 g/dL — ABNORMAL LOW (ref 13.0–17.0)
MCH: 28.6 pg (ref 26.0–34.0)
MCHC: 33.2 g/dL (ref 30.0–36.0)
MCV: 86.2 fL (ref 78.0–100.0)
PLATELETS: 225 10*3/uL (ref 150–400)
RBC: 4.41 MIL/uL (ref 4.22–5.81)
RDW: 15.8 % — AB (ref 11.5–15.5)
WBC: 11.6 10*3/uL — ABNORMAL HIGH (ref 4.0–10.5)

## 2013-11-30 LAB — APTT: APTT: 35 s (ref 24–37)

## 2013-11-30 LAB — GLUCOSE, CAPILLARY: Glucose-Capillary: 100 mg/dL — ABNORMAL HIGH (ref 70–99)

## 2013-11-30 MED ORDER — MIDAZOLAM HCL 2 MG/2ML IJ SOLN
INTRAMUSCULAR | Status: AC
  Filled 2013-11-30: qty 4

## 2013-11-30 MED ORDER — SODIUM CHLORIDE 0.9 % IV SOLN
INTRAVENOUS | Status: DC
  Administered 2013-11-30: 10:00:00 via INTRAVENOUS

## 2013-11-30 MED ORDER — FENTANYL CITRATE 0.05 MG/ML IJ SOLN
INTRAMUSCULAR | Status: AC | PRN
  Administered 2013-11-30: 50 ug via INTRAVENOUS

## 2013-11-30 MED ORDER — MIDAZOLAM HCL 2 MG/2ML IJ SOLN
INTRAMUSCULAR | Status: AC | PRN
  Administered 2013-11-30 (×2): 1 mg via INTRAVENOUS

## 2013-11-30 MED ORDER — FENTANYL CITRATE 0.05 MG/ML IJ SOLN
INTRAMUSCULAR | Status: AC
  Filled 2013-11-30: qty 4

## 2013-11-30 NOTE — H&P (Signed)
Austin [name] is an 58 y.o. male.   Chief Complaint: "I'm having a biopsy done" HPI: Patient with history quadriplegia, CVA 1994, left nephrectomy for [place] 2013; now with findings of right RP/omental masses and left adrenal mass on recent CT. He presents today for CT guided biopsy of the right RP mass.   Past Medical History  Diagnosis Date  . Hx of colonic polyps   . Hypertension   . Pituitary adenoma   . Urinary incontinence   . Venous stasis     edema  . 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 Hypogonadism   . Neuromuscular disorder     quadraplegic  . Chronic kidney disease     Left Renal Mass  . Sleep apnea     stopbang=4  . Cerebrovascular accident 38    hx of brainstem stroke, residual diminished lung capacity    Past Surgical History  Procedure Laterality Date  . Tracheostomy tube placement  02/1993    removed 04/1993  . Stomach peg  02/1993    removed 5-6 years later  . Cholecystectomy    . Pituitary surgery  2007, 2011  . Gamma knife radiation surgery  05/2010    for pituitary  . Colonoscopy  02/27/2012    Procedure: COLONOSCOPY;  Surgeon: Jerene Bears, MD;  Location: [facility];  Service: Gastroenterology;  Laterality: N/A;  . Vocal cord surgery      injected with collagen, then fat from stomach to improve speech s/p stroke  . Vasectomy  1984  . Robotic partial nephrectomy  04-23-12    left   Left Renal Mass  . Robot assisted laparoscopic nephrectomy  04/23/2012    Procedure: ROBOTIC ASSISTED LAPAROSCOPIC NEPHRECTOMY;  Surgeon: Alexis Frock, MD;  Location: [facility];  Service: Urology;  Laterality: Left;  radical  . Umbilical hernia repair  04/23/2012    Procedure: HERNIA REPAIR UMBILICAL ADULT;  Surgeon: Alexis Frock, MD;  Location: [facility];  Service: Urology;;    Family History  Problem Relation Age of Onset  . Alcohol abuse Father   . Arthritis Mother   . Colon cancer Brother   . Arthritis Maternal Grandmother   .  Hyperlipidemia Mother   . Hypertension Brother   . Stroke Paternal Grandmother   . Throat cancer Father   . Esophageal cancer Father 5  . Alcohol abuse Father   . Uterine cancer Mother 56   Social History:  reports that he quit smoking about 21 years ago. His smoking use included Cigarettes. He has a 11.5 pack-year smoking history. He has never used smokeless tobacco. He reports that he does not drink alcohol or use illicit drugs.  Allergies:  Allergies  Allergen Reactions  . Penicillins     REACTION: rash    Current outpatient prescriptions:atorvastatin (LIPITOR) 20 MG tablet, Take 20 mg by mouth every morning., Disp: , Rfl: ;  CRANBERRY PO, Take 1 tablet by mouth 2 (two) times daily., Disp: , Rfl: ;  escitalopram (LEXAPRO) 10 MG tablet, Take 10 mg by mouth every morning., Disp: , Rfl: ;  lisinopril-hydrochlorothiazide (PRINZIDE,ZESTORETIC) 20-12.5 MG per tablet, Take 1 tablet by mouth every morning., Disp: , Rfl:  metFORMIN (GLUCOPHAGE) 500 MG tablet, Take 500 mg by mouth 2 (two) times daily with a meal., Disp: , Rfl: ;  Multiple Vitamin (MULTIVITAMIN WITH MINERALS) TABS tablet, Take 1 tablet by mouth daily., Disp: , Rfl: ;  traZODone (DESYREL) 100 MG tablet, Take 100 mg by  mouth at bedtime., Disp: , Rfl: ;  clopidogrel (PLAVIX) 75 MG tablet, Take 75 mg by mouth at bedtime., Disp: , Rfl:  ketoconazole (NIZORAL) 2 % cream, Apply 1 application topically daily as needed for irritation., Disp: , Rfl: ;  testosterone enanthate (DELATESTRYL) 200 MG/ML injection, Inject 200 mg into the muscle every 21 ( twenty-one) days. For IM use only, Disp: , Rfl: ;  triamcinolone cream (KENALOG) 0.1 %, Apply 1 application topically daily as needed., Disp: , Rfl:  Current facility-administered medications:0.9 %  sodium chloride infusion, , Intravenous, Continuous, Ascencion Dike, PA-C  No results found for this or any previous visit (from the past 48 hour(s)). No results found.  Review of Systems   Constitutional: Negative for fever and chills.  Respiratory: Negative for hemoptysis and shortness of breath.        Occ cough  Cardiovascular: Negative for chest pain.  Gastrointestinal: Negative for vomiting, abdominal pain and blood in stool.       Occ nausea  Musculoskeletal: Positive for back pain.  Neurological: Negative for headaches.       Quadriplegia  Endo/Heme/Allergies: Bruises/bleeds easily.    Blood pressure 141/115, pulse 78, temperature 98.5 F (36.9 C), temperature source Oral, resp. rate 18, SpO2 98.00%. Physical Exam  Constitutional: He is oriented to person, place, and time. He appears well-developed and well-nourished.  Cardiovascular: Normal rate and regular rhythm.   Respiratory: Effort normal.  Sl dim BS bases  GI: Soft. Bowel sounds are normal. There is no tenderness.  Musculoskeletal: He exhibits no edema.  Neurological: He is alert and oriented to person, place, and time.  Quadripleigic; speech dysarthric secondary to CVA     Assessment/Plan Patient with history quadriplegia, CVA 1994, left nephrectomy for [place] 2013; now with findings of right RP/omental masses and left adrenal mass on recent CT. He presents today for CT guided biopsy of the right RP mass. Details/risks of procedure d/w pt/wife with their understanding and consent.  Austin [name] Austin [name] 11/30/2013, 9:37 AM

## 2013-11-30 NOTE — Procedures (Signed)
Interventional Radiology Procedure Note  Procedure: CT biopsy right abdominal nodule Complications: Mild post biopsy perilesional hemorrhage Recommendations: - bedrest x 3 hrs - path pending  Signed,  Criselda Peaches, MD [facility]

## 2013-11-30 NOTE — Discharge Instructions (Signed)
Conscious Sedation, Adult, Care After Refer to this sheet in the next few weeks. These instructions provide you with information on caring for yourself after your procedure. Your health care provider may also give you more specific instructions. Your treatment has been planned according to current medical practices, but problems sometimes occur. Call your health care provider if you have any problems or questions after your procedure. WHAT TO EXPECT AFTER THE PROCEDURE  After your procedure:  You may feel sleepy, clumsy, and have poor balance for several hours.  Vomiting may occur if you eat too soon after the procedure. HOME CARE INSTRUCTIONS  Do not participate in any activities where you could become injured for at least 24 hours. Do not:  Drive.  Swim.  Ride a bicycle.  Operate heavy machinery.  Cook.  Use power tools.  Climb ladders.  Work from a high place.  Do not make important decisions or sign legal documents until you are improved.  If you vomit, drink water, juice, or soup when you can drink without vomiting. Make sure you have little or no nausea before eating solid foods.  Only take over-the-counter or prescription medicines for pain, discomfort, or fever as directed by your health care provider.  Make sure you and your family fully understand everything about the medicines given to you, including what side effects may occur.  You should not drink alcohol, take sleeping pills, or take medicines that cause drowsiness for at least 24 hours.  If you smoke, do not smoke without supervision.  If you are feeling better, you may resume normal activities 24 hours after you were sedated.  Keep all appointments with your health care provider. SEEK MEDICAL CARE IF:  Your skin is pale or bluish in color.  You continue to feel nauseous or vomit.  Your pain is getting worse and is not helped by medicine.  You have bleeding or swelling.  You are still sleepy or  feeling clumsy after 24 hours. SEEK IMMEDIATE MEDICAL CARE IF:  You develop a rash.  You have difficulty breathing.  You develop any type of allergic problem.  You have a fever. MAKE SURE YOU:  Understand these instructions.  Will watch your condition.  Will get help right away if you are not doing well or get worse. Document Released: 05/18/2013 Document Reviewed: 03/04/2013 [facility] Patient Information 2014 [place], [place]. Biopsy Care After Refer to this sheet in the next few weeks. These instructions provide you with information on caring for yourself after your procedure. Your caregiver may also give you more specific instructions. Your treatment has been planned according to current medical practices, but problems sometimes occur. Call your caregiver if you have any problems or questions after your procedure. If you had a fine needle biopsy, you may have soreness at the biopsy site for 1 to 2 days. If you had an open biopsy, you may have soreness at the biopsy site for 3 to 4 days. HOME CARE INSTRUCTIONS   You may resume normal diet and activities as directed.  Change bandages (dressings) as directed. If your wound was closed with a skin glue (adhesive), it will wear off and begin to peel in 7 days.  Only take over-the-counter or prescription medicines for pain, discomfort, or fever as directed by your caregiver.  Ask your caregiver when you can bathe and get your wound wet. SEEK IMMEDIATE MEDICAL CARE IF:   You have increased bleeding (more than a small spot) from the biopsy site.  You notice  redness, swelling, or increasing pain at the biopsy site.  You have pus coming from the biopsy site.  You have a fever.  You notice a bad smell coming from the biopsy site or dressing.  You have a rash, have difficulty breathing, or have any allergic problems. MAKE SURE YOU:   Understand these instructions.  Will watch your condition.  Will get help right away if you are  not doing well or get worse. Document Released: 02/14/2005 Document Revised: 10/20/2011 Document Reviewed: 01/23/2011 [facility] Patient Information 2014 [place].

## 2013-12-08 ENCOUNTER — Telehealth: Payer: Self-pay | Admitting: Family Medicine

## 2013-12-08 ENCOUNTER — Telehealth: Payer: Self-pay | Admitting: Oncology

## 2013-12-08 NOTE — Telephone Encounter (Signed)
Received a call from [facility]: [facility] pt need a [facility] referral faxed referral to [employer] awaiting authorization for 12-08-2013 visit  Fax# [phone] Address: [address] [place], [place] [address]  [phone]

## 2013-12-08 NOTE — Telephone Encounter (Signed)
S/W PATIENT WIFE AND GVE NP APPT FOR 05/06 @ 1:30 W/DR. SHADAD.  REFERRING DR. MANNY DX- NEW METS RENAL CELL CARCINOMA

## 2013-12-08 NOTE — Telephone Encounter (Signed)
C/D 12/08/13 for appt. 12/14/13

## 2013-12-09 ENCOUNTER — Other Ambulatory Visit: Payer: Self-pay | Admitting: Oncology

## 2013-12-09 DIAGNOSIS — C649 Malignant neoplasm of unspecified kidney, except renal pelvis: Secondary | ICD-10-CM

## 2013-12-14 ENCOUNTER — Ambulatory Visit (HOSPITAL_BASED_OUTPATIENT_CLINIC_OR_DEPARTMENT_OTHER): Payer: Commercial Managed Care - HMO | Admitting: Oncology

## 2013-12-14 ENCOUNTER — Encounter: Payer: Self-pay | Admitting: Oncology

## 2013-12-14 ENCOUNTER — Telehealth: Payer: Self-pay | Admitting: Oncology

## 2013-12-14 VITALS — BP 152/72 | HR 71 | Temp 97.5°F | Resp 18 | Ht 72.0 in

## 2013-12-14 DIAGNOSIS — I69965 Other paralytic syndrome following unspecified cerebrovascular disease, bilateral: Secondary | ICD-10-CM

## 2013-12-14 DIAGNOSIS — C786 Secondary malignant neoplasm of retroperitoneum and peritoneum: Secondary | ICD-10-CM

## 2013-12-14 DIAGNOSIS — G8250 Quadriplegia, unspecified: Secondary | ICD-10-CM

## 2013-12-14 DIAGNOSIS — C649 Malignant neoplasm of unspecified kidney, except renal pelvis: Secondary | ICD-10-CM

## 2013-12-14 MED ORDER — PAZOPANIB HCL 200 MG PO TABS: 800.0000 mg | ORAL_TABLET | Freq: Every day | ORAL | Status: DC

## 2013-12-14 NOTE — Progress Notes (Signed)
Hematology and Oncology Follow Up Visit  Austin [name] [id number] 05-Jan-1956 58 y.o. 12/14/2013 2:23 PM [name], MDBurchette, [name], MD   Principle Diagnosis: 58 year old gentleman with a renal cell carcinoma diagnosed in 2013 he presented with a 4.2 cm mass in the lower pole of the left kidney. Now he has stage IV disease.   Prior Therapy: He is status post robotic-assisted laparoscopic nephrectomy on the left done on 04/23/2012. The pathology revealed renal cell carcinoma with a grade 3/4 with the pathological staging of T3a.   Current therapy: Under evaluation for systemic therapy.  Interim History: 58 year old man I saw in consultation in July of 2013 for the new diagnosis of renal cell carcinoma. He had presented with left kidney mass and hematuria and subsequently underwent a nephrectomy and July of 2013. The pathology revealed a T3a disease without any evidence of metastasis. He remained disease-free until recently where a CT scan of the abdomen and pelvis by Dr. Tresa Moore showed enlarging retroperitoneal nodule as well as omental implants. Also a new left adrenal on dual suspicious for metastatic disease. He subsequently underwent a biopsy on 11/30/2013 which confirmed the presence of metastatic renal cell carcinoma. Clinically, he is asymptomatic at this time. He is not reporting any abdominal pain or discomfort. Has not reported any flank pain or hematuria. Has not reported chest pain or difficulty breathing. Has not reported any deterioration in his health status. 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  Medications: I have reviewed the patient's current medications.  Current Outpatient Prescriptions  Medication Sig Dispense Refill  . atorvastatin (LIPITOR) 20 MG tablet Take 20 mg by mouth every morning.      . clopidogrel (PLAVIX) 75 MG tablet Take 75 mg by mouth at bedtime.      Marland Kitchen CRANBERRY PO Take 1  tablet by mouth 2 (two) times daily.      Marland Kitchen escitalopram (LEXAPRO) 10 MG tablet Take 10 mg by mouth every morning.      Marland Kitchen ketoconazole (NIZORAL) 2 % cream Apply 1 application topically daily as needed for irritation.      Marland Kitchen lisinopril-hydrochlorothiazide (PRINZIDE,ZESTORETIC) 20-12.5 MG per tablet Take 1 tablet by mouth every morning.      . metFORMIN (GLUCOPHAGE) 500 MG tablet Take 500 mg by mouth 2 (two) times daily with a meal.      . Multiple Vitamin (MULTIVITAMIN WITH MINERALS) TABS tablet Take 1 tablet by mouth daily.      . pazopanib (VOTRIENT) 200 MG tablet Take 4 tablets (800 mg total) by mouth daily. Take on an empty stomach.  120 tablet  0  . testosterone enanthate (DELATESTRYL) 200 MG/ML injection Inject 200 mg into the muscle every 21 ( twenty-one) days. For IM use only      . traZODone (DESYREL) 100 MG tablet Take 100 mg by mouth at bedtime.      . triamcinolone cream (KENALOG) 0.1 % Apply 1 application topically daily as needed.       No current facility-administered medications for this visit.     Allergies:  Allergies  Allergen Reactions  . Penicillins     REACTION: rash    Past Medical History, Surgical history, Social history, and Family History were reviewed and updated.  Review of Systems: Constitutional:  Negative for fever, chills, night sweats, anorexia, weight loss, pain. Cardiovascular: no chest pain or dyspnea on exertion Respiratory: no cough, shortness of breath, or wheezing Neurological: no  TIA or stroke symptoms Dermatological: negative ENT: negative Skin: Negative. Gastrointestinal: no abdominal pain, change in bowel habits, or black or bloody stools Genito-Urinary: no dysuria, trouble voiding, or hematuria Hematological and Lymphatic: negative Breast: negative Musculoskeletal: negative Remaining ROS negative. Physical Exam: Blood pressure 152/72, pulse 71, temperature 97.5 F (36.4 C), temperature source Oral, resp. rate 18, height 6' (1.829  m), weight 0 lb (0 kg), SpO2 99.00%. ECOG: 2 General appearance: alert and cooperative Head: Normocephalic, without obvious abnormality, atraumatic Neck: no adenopathy, no carotid bruit, no JVD, supple, symmetrical, trachea midline and thyroid not enlarged, symmetric, no tenderness/mass/nodules Lymph nodes: Cervical, supraclavicular, and axillary nodes normal. Heart:regular rate and rhythm, S1, S2 normal, no murmur, click, rub or gallop Lung:chest clear, no wheezing, rales, normal symmetric air entry Abdomin: soft, non-tender, without masses or organomegaly EXT:no erythema, induration, or nodules   Lab Results: Lab Results  Component Value Date   WBC 11.6* 11/30/2013   HGB 12.6* 11/30/2013   HCT 38.0* 11/30/2013   MCV 86.2 11/30/2013   PLT 225 11/30/2013     Chemistry      Component Value Date/Time   NA 137 10/17/2013 1706   K 4.0 10/17/2013 1706   CL 102 10/17/2013 1706   CO2 24 10/17/2013 1706   BUN 17 10/17/2013 1706   CREATININE 1.1 10/17/2013 1706      Component Value Date/Time   CALCIUM 9.6 10/17/2013 1706   ALKPHOS 78 10/17/2013 1706   AST 18 10/17/2013 1706   ALT 25 10/17/2013 1706   BILITOT 0.5 10/17/2013 1706       Impression and Plan:  58 year old gentleman with the following issues:  1. Renal cell carcinoma diagnosed with a left renal mass in July of 2013 and after a nephrectomy developed peritoneal and retroperitoneal metastasis. This have been biopsy proven to be stage IV renal cell carcinoma done in April 2015. The natural course of this disease was discussed today as well as the treatment options and goals. He understands that there is no curative option at this time and we are dealing with a palliative goal at this moment. I see no oral for surgical resection at this time but certainly no radiation therapy. Systemic therapy options were discussed including immunotherapy with high-dose IL-2 which I believe he is not a candidate for. Tyrosine kinase inhibitors would be a reasonable  option for him. The risks and benefits of a medication such as Votrient. Side effects of this medication that includes nausea, fatigue, hand foot syndrome, hypertension, hepatotoxicity, thromboembolic phenomenon among other complications. The benefit would include a lady his disease and disease control for an extended period of time. Informed consent was obtained today and written information was given to the patient. I plan to repeat imaging studies in about 2 months to assess response. He will followup in 1 month for assessment of complications.  2. 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 His voice is very low but he understands fully the ramification of his diagnosis and prognosis.   Wyatt Portela, MD 5/6/20152:23 PM

## 2013-12-14 NOTE — Progress Notes (Signed)
Votrient prescription given to Austin [name] in managed care for preauthorization.

## 2013-12-14 NOTE — Telephone Encounter (Signed)
Gave pt appt for alb and MD for june 2015

## 2013-12-21 ENCOUNTER — Encounter: Payer: Self-pay | Admitting: Oncology

## 2013-12-21 NOTE — Progress Notes (Signed)
[employer] approved votrient from 12/21/13-12/22/14

## 2013-12-30 ENCOUNTER — Encounter: Payer: Self-pay | Admitting: Oncology

## 2013-12-30 NOTE — Progress Notes (Signed)
Wife --Peter Congo had left message and will be bringing me Commitment to Access app for patient and we need to fill out.

## 2013-12-30 NOTE — Progress Notes (Signed)
Wife bought in application for asst with Votrient and I will get to dr and faxed.

## 2014-01-03 ENCOUNTER — Encounter: Payer: Self-pay | Admitting: Oncology

## 2014-01-03 NOTE — Progress Notes (Signed)
Commitment to access called to get me set up as an adovacate. They will be contacting the patient about being approved for Votrient.

## 2014-01-04 ENCOUNTER — Encounter: Payer: Self-pay | Admitting: Oncology

## 2014-01-04 NOTE — Progress Notes (Signed)
Per Commitment to Access the patient has been approved for asst with Votrient thru 12/29/14 pat id# [id number]. Sending confirmation to billing and medical records.

## 2014-01-05 ENCOUNTER — Telehealth: Payer: Self-pay | Admitting: Family Medicine

## 2014-01-05 MED ORDER — CLOPIDOGREL BISULFATE 75 MG PO TABS: 75.0000 mg | ORAL_TABLET | Freq: Every day | ORAL | Status: DC

## 2014-01-05 NOTE — Telephone Encounter (Signed)
[facility] (MAIL [facility] - [place], [place] is requesting re-fill on clopidogrel (PLAVIX) 75 MG tablet

## 2014-01-05 NOTE — Telephone Encounter (Signed)
Rx sent to pharmacy   

## 2014-01-10 ENCOUNTER — Encounter: Payer: Self-pay | Admitting: Internal Medicine

## 2014-01-13 ENCOUNTER — Telehealth: Payer: Self-pay | Admitting: Family Medicine

## 2014-01-13 ENCOUNTER — Telehealth: Payer: Self-pay | Admitting: Oncology

## 2014-01-13 ENCOUNTER — Other Ambulatory Visit (HOSPITAL_BASED_OUTPATIENT_CLINIC_OR_DEPARTMENT_OTHER): Payer: Commercial Managed Care - HMO

## 2014-01-13 ENCOUNTER — Encounter: Payer: Self-pay | Admitting: Oncology

## 2014-01-13 ENCOUNTER — Ambulatory Visit (HOSPITAL_BASED_OUTPATIENT_CLINIC_OR_DEPARTMENT_OTHER): Payer: Commercial Managed Care - HMO | Admitting: Oncology

## 2014-01-13 VITALS — BP 188/89 | HR 66 | Temp 97.1°F | Resp 20 | Ht 72.0 in

## 2014-01-13 DIAGNOSIS — C649 Malignant neoplasm of unspecified kidney, except renal pelvis: Secondary | ICD-10-CM

## 2014-01-13 DIAGNOSIS — C786 Secondary malignant neoplasm of retroperitoneum and peritoneum: Secondary | ICD-10-CM

## 2014-01-13 DIAGNOSIS — R609 Edema, unspecified: Secondary | ICD-10-CM

## 2014-01-13 LAB — COMPREHENSIVE METABOLIC PANEL (CC13)
ALT: 46 U/L (ref 0–55)
ANION GAP: 14 meq/L — AB (ref 3–11)
AST: 37 U/L — ABNORMAL HIGH (ref 5–34)
Albumin: 2.7 g/dL — ABNORMAL LOW (ref 3.5–5.0)
Alkaline Phosphatase: 86 U/L (ref 40–150)
BILIRUBIN TOTAL: 0.49 mg/dL (ref 0.20–1.20)
BUN: 16.6 mg/dL (ref 7.0–26.0)
CALCIUM: 9.4 mg/dL (ref 8.4–10.4)
CHLORIDE: 101 meq/L (ref 98–109)
CO2: 19 meq/L — AB (ref 22–29)
Creatinine: 1.1 mg/dL (ref 0.7–1.3)
Glucose: 92 mg/dl (ref 70–140)
Potassium: 4.2 mEq/L (ref 3.5–5.1)
SODIUM: 134 meq/L — AB (ref 136–145)
Total Protein: 7.1 g/dL (ref 6.4–8.3)

## 2014-01-13 LAB — CBC WITH DIFFERENTIAL/PLATELET
BASO%: 1 % (ref 0.0–2.0)
Basophils Absolute: 0.1 10*3/uL (ref 0.0–0.1)
EOS%: 3.4 % (ref 0.0–7.0)
Eosinophils Absolute: 0.4 10*3/uL (ref 0.0–0.5)
HCT: 41.5 % (ref 38.4–49.9)
HGB: 14 g/dL (ref 13.0–17.1)
LYMPH#: 0.8 10*3/uL — AB (ref 0.9–3.3)
LYMPH%: 6 % — ABNORMAL LOW (ref 14.0–49.0)
MCH: 29.3 pg (ref 27.2–33.4)
MCHC: 33.6 g/dL (ref 32.0–36.0)
MCV: 87.2 fL (ref 79.3–98.0)
MONO#: 0.9 10*3/uL (ref 0.1–0.9)
MONO%: 7.2 % (ref 0.0–14.0)
NEUT#: 10.6 10*3/uL — ABNORMAL HIGH (ref 1.5–6.5)
NEUT%: 82.4 % — ABNORMAL HIGH (ref 39.0–75.0)
Platelets: 177 10*3/uL (ref 140–400)
RBC: 4.76 10*6/uL (ref 4.20–5.82)
RDW: 15.9 % — ABNORMAL HIGH (ref 11.0–14.6)
WBC: 12.9 10*3/uL — AB (ref 4.0–10.3)

## 2014-01-13 MED ORDER — CLOPIDOGREL BISULFATE 75 MG PO TABS: 75.0000 mg | ORAL_TABLET | Freq: Every day | ORAL | Status: DC

## 2014-01-13 NOTE — Telephone Encounter (Signed)
Rx sent to [facility]  

## 2014-01-13 NOTE — Progress Notes (Signed)
Hematology and Oncology Follow Up Visit  Austin [name] [id number] Aug 30, 1955 58 y.o. 01/13/2014 10:54 AM Austin [name], MDBurchette, Austin Sierras, MD   Principle Diagnosis: 58 year old gentleman with a renal cell carcinoma diagnosed in 2013 he presented with a 4.2 cm mass in the lower pole of the left kidney. Now he has stage IV disease.   Prior Therapy: He is status [name] robotic-assisted laparoscopic nephrectomy on the left done on 04/23/2012. The pathology revealed renal cell carcinoma with a grade 3/4 with the pathological staging of T3a.   Current therapy: Votrient 800 mg daily started around 12/27/2013.  Interim History: Austin [name] presents today for a followup visit. Since his last visit, he started Votrient with you minor symptoms. He is reporting grade 1 fatigue and slight edema periorbital and upper extremity. He also noted some slight decrease in his urine output but otherwise no other complications. He is not reporting any abdominal pain or discomfort. Has not reported any flank pain or hematuria. Has not reported chest pain or difficulty breathing. Has not reported any deterioration in his health status. 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 He has not reported any hospitalization or illnesses.  Medications: I have reviewed the patient's current medications.  Current Outpatient Prescriptions  Medication Sig Dispense Refill  . atorvastatin (LIPITOR) 20 MG tablet Take 20 mg by mouth every morning.      . clopidogrel (PLAVIX) 75 MG tablet Take 1 tablet (75 mg total) by mouth at bedtime.  90 tablet  2  . CRANBERRY PO Take 1 tablet by mouth 2 (two) times daily.      Marland Kitchen escitalopram (LEXAPRO) 10 MG tablet Take 10 mg by mouth every morning.      Marland Kitchen ketoconazole (NIZORAL) 2 % cream Apply 1 application topically daily as needed for irritation.      Marland Kitchen lisinopril-hydrochlorothiazide (PRINZIDE,ZESTORETIC) 20-12.5 MG per  tablet Take 1 tablet by mouth every morning.      . metFORMIN (GLUCOPHAGE) 500 MG tablet Take 500 mg by mouth 2 (two) times daily with a meal.      . Multiple Vitamin (MULTIVITAMIN WITH MINERALS) TABS tablet Take 1 tablet by mouth daily.      . pazopanib (VOTRIENT) 200 MG tablet Take 4 tablets (800 mg total) by mouth daily. Take on an empty stomach.  120 tablet  0  . testosterone enanthate (DELATESTRYL) 200 MG/ML injection Inject 200 mg into the muscle every 21 ( twenty-one) days. For IM use only      . traZODone (DESYREL) 100 MG tablet Take 100 mg by mouth at bedtime.      . triamcinolone cream (KENALOG) 0.1 % Apply 1 application topically daily as needed.       No current facility-administered medications for this visit.     Allergies:  Allergies  Allergen Reactions  . Penicillins     REACTION: rash    Past Medical History, Surgical history, Social history, and Family History were reviewed and updated.  Review of Systems: Constitutional:  Negative for fever, chills, night sweats, anorexia, weight loss, pain. Cardiovascular: no chest pain or dyspnea on exertion Respiratory: no cough, shortness of breath, or wheezing Gastrointestinal: no abdominal pain, change in bowel habits, or black or bloody stools Genito-Urinary: no dysuria, trouble voiding, or hematuria  Remaining ROS negative. Physical Exam: Blood pressure 188/89, pulse 66, temperature 97.1 F (36.2 C), temperature source Oral, resp. rate 20, height 6' (1.829 m), weight 0  lb (0 kg). ECOG: 2 General appearance: alert Head: Normocephalic, without obvious abnormality, atraumatic slight periorbital edema. Neck: no adenopathy Lymph nodes: Cervical, supraclavicular, and axillary nodes normal. Heart:regular rate and rhythm, S1, S2 normal, no murmur, click, rub or gallop Lung:chest clear, no wheezing, rales, no dullness to percussion the Abdomin: soft, non-tender, without masses or organomegaly EXT:no erythema, induration, or  nodules. Trace right upper extremity edema.   Lab Results: Lab Results  Component Value Date   WBC 12.9* 01/13/2014   HGB 14.0 01/13/2014   HCT 41.5 01/13/2014   MCV 87.2 01/13/2014   PLT 177 01/13/2014     Chemistry      Component Value Date/Time   NA 134* 01/13/2014 0947   NA 137 10/17/2013 1706   K 4.2 01/13/2014 0947   K 4.0 10/17/2013 1706   CL 102 10/17/2013 1706   CO2 19* 01/13/2014 0947   CO2 24 10/17/2013 1706   BUN 16.6 01/13/2014 0947   BUN 17 10/17/2013 1706   CREATININE 1.1 01/13/2014 0947   CREATININE 1.1 10/17/2013 1706      Component Value Date/Time   CALCIUM 9.4 01/13/2014 0947   CALCIUM 9.6 10/17/2013 1706   ALKPHOS 86 01/13/2014 0947   ALKPHOS 78 10/17/2013 1706   AST 37* 01/13/2014 0947   AST 18 10/17/2013 1706   ALT 46 01/13/2014 0947   ALT 25 10/17/2013 1706   BILITOT 0.49 01/13/2014 0947   BILITOT 0.5 10/17/2013 1706       Impression and Plan:  58 year old gentleman with the following issues:  1. Renal cell carcinoma diagnosed with a left renal mass in July of 2013 and after a nephrectomy developed peritoneal and retroperitoneal metastasis. He has been on Votrient for about 3 weeks with very manageable toxicities. I plan on repeating imaging studies before the next visit which will be planned in July of 2015.  2. Fatigue and periorbital edema: This is most likely related to Votrient we'll continue to monitor for the time being.  3. Decreased oral intake: His creatinine today is 1.1 which is unchanged from previous results. I recommended that he continues hydration for the time being.  4. 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 His voice is very low but he understands fully the ramification of his diagnosis and prognosis.   Wyatt Portela, MD 6/5/201510:54 AM

## 2014-01-13 NOTE — Telephone Encounter (Signed)
Pt's needs new rx for clopidogrel (PLAVIX) 75 MG tablet, pt has switched pharmacies and need file updated. [facility].

## 2014-01-13 NOTE — Telephone Encounter (Signed)
gv pt/relative appt schedule for july. [facility] will call w/ct appt

## 2014-01-23 ENCOUNTER — Telehealth: Payer: Self-pay | Admitting: Family Medicine

## 2014-01-23 ENCOUNTER — Telehealth: Payer: Self-pay | Admitting: *Deleted

## 2014-01-23 NOTE — Telephone Encounter (Signed)
Pt is okay with coming in on Wednesday and appointment has been made.

## 2014-01-23 NOTE — Telephone Encounter (Signed)
Patient Information:  Caller Name: Austin [name]  Phone: [phone]  Patient: Austin [name], Austin [name]  Gender: Male  DOB: 1955-12-23  Age: 58 Years  PCP: Carolann Littler (Family Practice)  Office Follow Up:  Does the office need to follow up with this patient?: Yes  Instructions For The Office: Pt needs to see provider and refuses to see any other provider but Dr Sinclair Ship, Pt is having problems with high blood pressure and possible UTI.  Pleas f/u with pt wife Austin [name] for appt, thank you.   Symptoms  Reason For Call & Symptoms: Pt's wife/Gloria calling to advise increased blood pressure 179/103, and possible UTI.  Reviewed Health History In EMR: Yes  Reviewed Medications In EMR: Yes  Reviewed Allergies In EMR: Yes  Reviewed Surgeries / Procedures: Yes  Date of Onset of Symptoms: 01/23/2014  Guideline(s) Used:  High Blood Pressure  Disposition Per Guideline:   See Today in Office  Reason For Disposition Reached:   Patient wants to be seen  Advice Given:  Call Back If:  You become worse.  Patient Will Follow Care Advice:  YES

## 2014-01-23 NOTE — Telephone Encounter (Signed)
THIS WEEKEND PT.'S BLOOD PRESSURE READINGS HAVE BEEN 208/108 AND  201/106. THIS MORNING B/P READINGS ARE 158/95 AND 179/103. PT. TO SEE HIS PRIMARY CARE PHYSICIAN ON Wednesday. VERBAL ORDER AND READ BACK TO [place]- PT. TO HALF VOTRIENT DOSE TO 400MG  A DAY. CALL THIS OFFICE WITH AN UPDATE ON PT.'S CONDITION AFTER HE HAS SEEN HIS PRIMARY CARE PHYSICIAN. NOTIFIED PT.'S WIFE OF THE ABOVE INSTRUCTIONS. PT.'S WIFE VOICES UNDERSTANDING BY TEACH BACK METHOD.

## 2014-01-24 ENCOUNTER — Encounter: Payer: Self-pay | Admitting: Oncology

## 2014-01-24 NOTE — Progress Notes (Signed)
Recd forms form [facility] to access and I mailed to the patient to sign and return to them for asst with Votrient.

## 2014-01-25 ENCOUNTER — Ambulatory Visit (INDEPENDENT_AMBULATORY_CARE_PROVIDER_SITE_OTHER): Payer: Commercial Managed Care - HMO | Admitting: Family Medicine

## 2014-01-25 ENCOUNTER — Telehealth: Payer: Self-pay | Admitting: Medical Oncology

## 2014-01-25 VITALS — BP 150/92 | HR 72 | Temp 98.3°F

## 2014-01-25 DIAGNOSIS — R300 Dysuria: Secondary | ICD-10-CM

## 2014-01-25 DIAGNOSIS — I10 Essential (primary) hypertension: Secondary | ICD-10-CM

## 2014-01-25 DIAGNOSIS — C649 Malignant neoplasm of unspecified kidney, except renal pelvis: Secondary | ICD-10-CM

## 2014-01-25 NOTE — Telephone Encounter (Signed)
Take 400 mg daily.

## 2014-01-25 NOTE — Telephone Encounter (Signed)
Spouse called stating that patient saw Dr. [name], his PCP, and he placed patient on Cipro for bladder infection as well as doubled his B/P medication d/t elevated B/P.  Per spouse, patient requesting to stay on the 1/2 dosage of Votrient..  MD inboxed for review regarding request to continue on decreased dosage of Votrient 400mg /day.  F/u 07/17 appt with MD

## 2014-01-26 LAB — POCT URINALYSIS DIPSTICK
Bilirubin, UA: NEGATIVE
Glucose, UA: NEGATIVE
Ketones, UA: NEGATIVE
NITRITE UA: POSITIVE
Spec Grav, UA: 1.01
Urobilinogen, UA: 0.2
pH, UA: 6

## 2014-01-26 NOTE — Addendum Note (Signed)
Addended by: Marian Sorrow on: 01/26/2014 08:05 AM   Modules accepted: Orders

## 2014-01-26 NOTE — Progress Notes (Signed)
Subjective:    Patient ID: Austin [name], male    DOB: 12-Jul-1956, 58 y.o.   MRN: [id number]  HPI History of renal cell cancer with recent dx of metastases and placed on Votrient.  Has developed increased fatigue, periorbital edema, some skin breakdown (buttocks), and elevated blood pressure since then.  On lisinopril HCTZ 20-12.5 mg once daily and stays fairly well hydrated.  Recent BP up to 208/108 and several readings over 536 systolic.  Also some dark colored and "foul smelling" urine past few days.  No fever.  Family aware he is likely colonized with  Hx of I/O caths but they are concerned as he has had these symptoms in past with UTI.  No nausea or vomiting.  Past Medical History  Diagnosis Date  . Hx of colonic polyps   . Hypertension   . Pituitary adenoma   . Urinary incontinence   . Venous stasis     edema  . 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 Hypogonadism   . Neuromuscular disorder     quadraplegic  . Chronic kidney disease     Left Renal Mass  . Sleep apnea     stopbang=4  . Cerebrovascular accident 21    hx of brainstem stroke, residual diminished lung capacity   Past Surgical History  Procedure Laterality Date  . Tracheostomy tube placement  02/1993    removed 04/1993  . Stomach peg  02/1993    removed 5-6 years later  . Cholecystectomy    . Pituitary surgery  2007, 2011  . Gamma knife radiation surgery  05/2010    for pituitary  . Colonoscopy  02/27/2012    Procedure: COLONOSCOPY;  Surgeon: Jerene Bears, MD;  Location: [facility];  Service: Gastroenterology;  Laterality: N/A;  . Vocal cord surgery      injected with collagen, then fat from stomach to improve speech s/p stroke  . Vasectomy  1984  . Robotic partial nephrectomy  04-23-12    left   Left Renal Mass  . Robot assisted laparoscopic nephrectomy  04/23/2012    Procedure: ROBOTIC ASSISTED LAPAROSCOPIC NEPHRECTOMY;  Surgeon: Alexis Frock, MD;  Location: [facility];   Service: Urology;  Laterality: Left;  radical  . Umbilical hernia repair  04/23/2012    Procedure: HERNIA REPAIR UMBILICAL ADULT;  Surgeon: Alexis Frock, MD;  Location: [facility];  Service: Urology;;    reports that he quit smoking about 21 years ago. His smoking use included Cigarettes. He has a 11.5 pack-year smoking history. He has never used smokeless tobacco. He reports that he does not drink alcohol or use illicit drugs. family history includes Alcohol abuse in his father and father; Arthritis in his maternal grandmother and mother; Colon cancer in his brother; Esophageal cancer (age of onset: 39) in his father; Hyperlipidemia in his mother; Hypertension in his brother; Stroke in his paternal grandmother; Throat cancer in his father; Uterine cancer (age of onset: 42) in his mother. Allergies  Allergen Reactions  . Penicillins     REACTION: rash      Review of Systems  Constitutional: Positive for fatigue. Negative for fever and chills.  Respiratory: Negative for shortness of breath.   Cardiovascular: Negative for chest pain and palpitations.  Gastrointestinal: Negative for nausea, vomiting and abdominal pain.  Genitourinary: Negative for dysuria.  Neurological: Negative for headaches.  Psychiatric/Behavioral: Negative for confusion.       Objective:   Physical Exam  Constitutional: He  is oriented to person, place, and time. He appears well-developed and well-nourished.  Cardiovascular: Normal rate.   Pulmonary/Chest: Effort normal and breath sounds normal. No respiratory distress. He has no wheezes. He has no rales.  Musculoskeletal:  Chronic mild LE edema.  Neurological: He is alert and oriented to person, place, and time.          Assessment & Plan:  #1 hypertension.  Poorly controlled ?secondary to  Votrient.  Increase lisinopril HCTZ 20/12.5 mg to one bid.  Stay hydrated.  Touch base by next week. #2 dysuria.  ?UTI.  Cipro 500 mg po bid for 7 days and urine cx.

## 2014-01-26 NOTE — Telephone Encounter (Signed)
Per MD, informed patient's spouse ok for him to take Votrient 400mg  daily. Spouse gave verbal understanding. States patient is feeling better this morning. Encouraged her to contact office with any questions or concerns.

## 2014-02-02 ENCOUNTER — Telehealth: Payer: Self-pay | Admitting: *Deleted

## 2014-02-02 NOTE — Telephone Encounter (Signed)
Wife calling to say patient had to stop the votrient, d/t side effects. Nausea, mouth is sore, fingers and toes tingling, unable to sit up for ore than an hour and then must sleep for an hour, states he is having skin breakdown. Per dr Alen Blew, hold votrient. Keep regularly scheduled visit 02/24/14 with dr Alen Blew. Wife verbalizes understanding.

## 2014-02-06 ENCOUNTER — Encounter: Payer: Self-pay | Admitting: *Deleted

## 2014-02-06 NOTE — Progress Notes (Signed)
[name] Social Work  Clinical Social Work was referred by patient/family to review and complete healthcare advance directives.  Clinical Social Worker met with patient and spouse in [place] office.  The patient designated spouse Austin [name] as their primary healthcare agent and daughter Austin [name] as their secondary agent.  Patient also completed healthcare living will. Austin [name] does not desire that his life be prolonged by life prolonging measures in the scenarios stated in his living will.  Patient and spouse shared their understanding of patient's current medical situation.  Patient expressed, "I'm not scared to die", but wishes to spend quality time with his family.    CSW and patient/family also discussed caregiving at home.  Patient's spouse serves as his primary caregiver- she expressed concerns that patient may need more assistance that she will not be able to provide.  CSW encouraged patient to apply for medicaid to utilize Personal Care Services(PCS) benefits.  The patient's desire is to remain in home as long as possible.  Clinical Social Worker notarized documents and made copies for patient/family. Clinical Social Worker will send documents to medical records to be scanned into patient's chart. Clinical Social Worker encouraged patient/family to contact with any additional questions or concerns.  Austin [name], MSW, [place] Worker [facility] [phone]

## 2014-02-15 ENCOUNTER — Ambulatory Visit (INDEPENDENT_AMBULATORY_CARE_PROVIDER_SITE_OTHER): Payer: Commercial Managed Care - HMO | Admitting: Family Medicine

## 2014-02-15 ENCOUNTER — Encounter: Payer: Self-pay | Admitting: Family Medicine

## 2014-02-15 VITALS — BP 132/80 | HR 90 | Temp 98.0°F

## 2014-02-15 DIAGNOSIS — I10 Essential (primary) hypertension: Secondary | ICD-10-CM

## 2014-02-15 MED ORDER — LISINOPRIL-HYDROCHLOROTHIAZIDE 20-12.5 MG PO TABS: ORAL_TABLET | ORAL | Status: DC

## 2014-02-15 NOTE — Progress Notes (Signed)
Pre visit review using our clinic review tool, if applicable. No additional management support is needed unless otherwise documented below in the visit note. 

## 2014-02-15 NOTE — Progress Notes (Signed)
Subjective:    Patient ID: Austin [name], male    DOB: 26-Sep-1955, 58 y.o.   MRN: [id number]  Hypertension Pertinent negatives include no chest pain, headaches, palpitations or shortness of breath.   Patient is seen in followup recent elevated blood pressure. We increased his lisinopril HCTZ to one twice daily. Blood pressure improved by home readings. Wife has gotten around 245 systolic. No dizziness. No headaches.  He had been on Votrient and felt very poorly with fatigue and some skin breakdown and they stopped this couple weeks ago and is seeing improvement since then. Overall feels much improved. He plans to see oncologist next week. He has CT chest abdomen and pelvis pending along with some pending blood work next week.  Past Medical History  Diagnosis Date  . Hx of colonic polyps   . Hypertension   . Pituitary adenoma   . Urinary incontinence   . Venous stasis     edema  . 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 Hypogonadism   . Neuromuscular disorder     quadraplegic  . Chronic kidney disease     Left Renal Mass  . Sleep apnea     stopbang=4  . Cerebrovascular accident 43    hx of brainstem stroke, residual diminished lung capacity   Past Surgical History  Procedure Laterality Date  . Tracheostomy tube placement  02/1993    removed 04/1993  . Stomach peg  02/1993    removed 5-6 years later  . Cholecystectomy    . Pituitary surgery  2007, 2011  . Gamma knife radiation surgery  05/2010    for pituitary  . Colonoscopy  02/27/2012    Procedure: COLONOSCOPY;  Surgeon: Jerene Bears, MD;  Location: [facility];  Service: Gastroenterology;  Laterality: N/A;  . Vocal cord surgery      injected with collagen, then fat from stomach to improve speech s/p stroke  . Vasectomy  1984  . Robotic partial nephrectomy  04-23-12    left   Left Renal Mass  . Robot assisted laparoscopic nephrectomy  04/23/2012    Procedure: ROBOTIC ASSISTED LAPAROSCOPIC  NEPHRECTOMY;  Surgeon: Alexis Frock, MD;  Location: [facility];  Service: Urology;  Laterality: Left;  radical  . Umbilical hernia repair  04/23/2012    Procedure: HERNIA REPAIR UMBILICAL ADULT;  Surgeon: Alexis Frock, MD;  Location: [facility];  Service: Urology;;    reports that he quit smoking about 21 years ago. His smoking use included Cigarettes. He has a 11.5 pack-year smoking history. He has never used smokeless tobacco. He reports that he does not drink alcohol or use illicit drugs. family history includes Alcohol abuse in his father and father; Arthritis in his maternal grandmother and mother; Colon cancer in his brother; Esophageal cancer (age of onset: 38) in his father; Hyperlipidemia in his mother; Hypertension in his brother; Stroke in his paternal grandmother; Throat cancer in his father; Uterine cancer (age of onset: 60) in his mother. Allergies  Allergen Reactions  . Penicillins     REACTION: rash      Review of Systems  Eyes: Negative for visual disturbance.  Respiratory: Negative for cough, chest tightness and shortness of breath.   Cardiovascular: Negative for chest pain, palpitations and leg swelling.  Neurological: Negative for dizziness, syncope, weakness, light-headedness and headaches.       Objective:   Physical Exam  Constitutional: He appears well-developed and well-nourished. No distress.  Cardiovascular: Normal rate and  regular rhythm.   Pulmonary/Chest: Effort normal and breath sounds normal. No respiratory distress. He has no wheezes. He has no rales.          Assessment & Plan:  Hypertension. Improved. Refill lisinopril HCTZ for one year. He has followup labs scheduled already for next week with compressive metabolic panel so no labs done today. Reassess 3 months

## 2014-02-21 ENCOUNTER — Ambulatory Visit (HOSPITAL_COMMUNITY)
Admission: RE | Admit: 2014-02-21 | Discharge: 2014-02-21 | Disposition: A | Discharge status: Home / Self Care | Payer: Medicare PPO | Source: Ambulatory Visit | Attending: Oncology | Admitting: Oncology

## 2014-02-21 ENCOUNTER — Other Ambulatory Visit (HOSPITAL_BASED_OUTPATIENT_CLINIC_OR_DEPARTMENT_OTHER): Payer: Commercial Managed Care - HMO

## 2014-02-21 ENCOUNTER — Other Ambulatory Visit: Payer: Self-pay | Admitting: Oncology

## 2014-02-21 ENCOUNTER — Encounter (HOSPITAL_COMMUNITY): Payer: Self-pay

## 2014-02-21 DIAGNOSIS — C786 Secondary malignant neoplasm of retroperitoneum and peritoneum: Secondary | ICD-10-CM | POA: Diagnosis not present

## 2014-02-21 DIAGNOSIS — J9819 Other pulmonary collapse: Secondary | ICD-10-CM | POA: Diagnosis not present

## 2014-02-21 DIAGNOSIS — E278 Other specified disorders of adrenal gland: Secondary | ICD-10-CM | POA: Diagnosis not present

## 2014-02-21 DIAGNOSIS — K5710 Diverticulosis of small intestine without perforation or abscess without bleeding: Secondary | ICD-10-CM | POA: Insufficient documentation

## 2014-02-21 DIAGNOSIS — Z905 Acquired absence of kidney: Secondary | ICD-10-CM | POA: Diagnosis not present

## 2014-02-21 DIAGNOSIS — N289 Disorder of kidney and ureter, unspecified: Secondary | ICD-10-CM | POA: Diagnosis not present

## 2014-02-21 DIAGNOSIS — C649 Malignant neoplasm of unspecified kidney, except renal pelvis: Secondary | ICD-10-CM

## 2014-02-21 DIAGNOSIS — K7689 Other specified diseases of liver: Secondary | ICD-10-CM | POA: Insufficient documentation

## 2014-02-21 DIAGNOSIS — J438 Other emphysema: Secondary | ICD-10-CM | POA: Insufficient documentation

## 2014-02-21 DIAGNOSIS — N400 Enlarged prostate without lower urinary tract symptoms: Secondary | ICD-10-CM | POA: Diagnosis not present

## 2014-02-21 DIAGNOSIS — I700 Atherosclerosis of aorta: Secondary | ICD-10-CM | POA: Insufficient documentation

## 2014-02-21 HISTORY — DX: Malignant (primary) neoplasm, unspecified: C80.1

## 2014-02-21 LAB — COMPREHENSIVE METABOLIC PANEL (CC13)
ALBUMIN: 3 g/dL — AB (ref 3.5–5.0)
ALT: 29 U/L (ref 0–55)
AST: 17 U/L (ref 5–34)
Alkaline Phosphatase: 89 U/L (ref 40–150)
Anion Gap: 10 mEq/L (ref 3–11)
BUN: 16.8 mg/dL (ref 7.0–26.0)
CALCIUM: 9.8 mg/dL (ref 8.4–10.4)
CHLORIDE: 99 meq/L (ref 98–109)
CO2: 23 meq/L (ref 22–29)
Creatinine: 1 mg/dL (ref 0.7–1.3)
Glucose: 145 mg/dl — ABNORMAL HIGH (ref 70–140)
Potassium: 4.4 mEq/L (ref 3.5–5.1)
SODIUM: 132 meq/L — AB (ref 136–145)
Total Bilirubin: 0.33 mg/dL (ref 0.20–1.20)
Total Protein: 7.8 g/dL (ref 6.4–8.3)

## 2014-02-21 LAB — CBC WITH DIFFERENTIAL/PLATELET
BASO%: 0.5 % (ref 0.0–2.0)
Basophils Absolute: 0.1 10*3/uL (ref 0.0–0.1)
EOS%: 3.4 % (ref 0.0–7.0)
Eosinophils Absolute: 0.4 10*3/uL (ref 0.0–0.5)
HCT: 34.1 % — ABNORMAL LOW (ref 38.4–49.9)
HEMOGLOBIN: 11.5 g/dL — AB (ref 13.0–17.1)
LYMPH%: 10.6 % — ABNORMAL LOW (ref 14.0–49.0)
MCH: 30.2 pg (ref 27.2–33.4)
MCHC: 33.7 g/dL (ref 32.0–36.0)
MCV: 89.5 fL (ref 79.3–98.0)
MONO#: 1 10*3/uL — ABNORMAL HIGH (ref 0.1–0.9)
MONO%: 8.1 % (ref 0.0–14.0)
NEUT#: 9.9 10*3/uL — ABNORMAL HIGH (ref 1.5–6.5)
NEUT%: 77.4 % — AB (ref 39.0–75.0)
Platelets: 237 10*3/uL (ref 140–400)
RBC: 3.81 10*6/uL — ABNORMAL LOW (ref 4.20–5.82)
RDW: 18.5 % — AB (ref 11.0–14.6)
WBC: 12.8 10*3/uL — ABNORMAL HIGH (ref 4.0–10.3)
lymph#: 1.4 10*3/uL (ref 0.9–3.3)

## 2014-02-21 MED ORDER — IOHEXOL 300 MG/ML  SOLN
100.0000 mL | Freq: Once | INTRAMUSCULAR | Status: AC | PRN
  Administered 2014-02-21: 100 mL via INTRAVENOUS

## 2014-02-24 ENCOUNTER — Encounter: Payer: Self-pay | Admitting: Oncology

## 2014-02-24 ENCOUNTER — Ambulatory Visit (HOSPITAL_BASED_OUTPATIENT_CLINIC_OR_DEPARTMENT_OTHER): Payer: Commercial Managed Care - HMO | Admitting: Oncology

## 2014-02-24 ENCOUNTER — Telehealth: Payer: Self-pay | Admitting: Oncology

## 2014-02-24 VITALS — BP 138/74 | HR 92 | Temp 97.5°F | Resp 18 | Ht 72.0 in

## 2014-02-24 DIAGNOSIS — R609 Edema, unspecified: Secondary | ICD-10-CM

## 2014-02-24 DIAGNOSIS — R5381 Other malaise: Secondary | ICD-10-CM

## 2014-02-24 DIAGNOSIS — R5383 Other fatigue: Secondary | ICD-10-CM

## 2014-02-24 DIAGNOSIS — C649 Malignant neoplasm of unspecified kidney, except renal pelvis: Secondary | ICD-10-CM

## 2014-02-24 NOTE — Progress Notes (Signed)
Hematology and Oncology Follow Up Visit  Austin [name] [id number] 10-14-1955 58 y.o. 02/24/2014 9:16 AM Austin [name], MDBurchette, Austin Sierras, MD   Principle Diagnosis: 58 year old gentleman with a renal cell carcinoma diagnosed in 2013 he presented with a 4.2 cm mass in the lower pole of the left kidney. Now he has stage IV disease.   Prior Therapy: He is status [name] robotic-assisted laparoscopic nephrectomy on the left done on 04/23/2012. The pathology revealed renal cell carcinoma with a grade 3/4 with the pathological staging of T3a.   Current therapy: Votrient 800 mg daily started around 12/27/2013. This was reduced to 400 mg subsequently and have been discontinued since 02/03/2014 due to poor tolerance.  Interim History: Austin [name] presents today for a followup visit. Since his last visit, he had more complications from the Votrient and required dose the reduction to 400 mg. He continues to tolerated poorly even with a reduced dose. He has more fatigue and tiredness. He had some skin changes and intractable diarrhea. He also developed hypertension and subsequently stopped the medication altogether. Since his discontinuation, he reported symptoms subsided. His fatigue is improved and his diarrhea have subsided.Marland Kitchen He is reporting grade 1 fatigue which is improving. He also noted some slight decrease in his urine output but otherwise no other complications. He is not reporting any abdominal pain or discomfort. Has not reported any flank pain or hematuria. Has not reported chest pain or difficulty breathing. Has not reported any deterioration in his health status. 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 He has not reported any hospitalization or illnesses. He has not reported any arthralgias or myalgias. As that report any lymphadenopathy or petechiae. Is not reporting any bleeding complications. Rest of review of systems  unremarkable.  Medications: I have reviewed the patient's current medications.  Current Outpatient Prescriptions  Medication Sig Dispense Refill  . atorvastatin (LIPITOR) 20 MG tablet Take 20 mg by mouth every morning.      . clopidogrel (PLAVIX) 75 MG tablet Take 1 tablet (75 mg total) by mouth at bedtime.  90 tablet  2  . CRANBERRY PO Take 1 tablet by mouth 2 (two) times daily.      Marland Kitchen escitalopram (LEXAPRO) 10 MG tablet Take 10 mg by mouth every morning.      Marland Kitchen ketoconazole (NIZORAL) 2 % cream Apply 1 application topically daily as needed for irritation.      Marland Kitchen lisinopril-hydrochlorothiazide (PRINZIDE,ZESTORETIC) 20-12.5 MG per tablet 2 tablets daily. One po bid      . metFORMIN (GLUCOPHAGE) 500 MG tablet Take 500 mg by mouth 2 (two) times daily with a meal.      . Multiple Vitamin (MULTIVITAMIN WITH MINERALS) TABS tablet Take 1 tablet by mouth daily.      Marland Kitchen testosterone enanthate (DELATESTRYL) 200 MG/ML injection Inject 200 mg into the muscle every 21 ( twenty-one) days. For IM use only      . traZODone (DESYREL) 100 MG tablet Take 100 mg by mouth at bedtime.      . triamcinolone cream (KENALOG) 0.1 % Apply 1 application topically daily as needed.      . pazopanib (VOTRIENT) 200 MG tablet Take 4 tablets (800 mg total) by mouth daily. Take on an empty stomach.  120 tablet  0   No current facility-administered medications for this visit.     Allergies:  Allergies  Allergen Reactions  . Penicillins     REACTION: rash  Past Medical History, Surgical history, Social history, and Family History were reviewed and updated.    Remaining ROS negative. Physical Exam: Blood pressure 138/74, pulse 92, temperature 97.5 F (36.4 C), temperature source Oral, resp. rate 18, height 6' (1.829 m), weight 0 lb (0 kg). ECOG: 2 General appearance: alert Head: Normocephalic, without obvious abnormality. Periorbital edema resolved.  Neck: no adenopathy Lymph nodes: Cervical, supraclavicular, and  axillary nodes normal. Heart:regular rate and rhythm, S1, S2 normal, no murmur, click, rub or gallop Lung:chest clear, no wheezing, rales, no dullness to percussion the Abdomin: soft, non-tender, without masses or organomegaly EXT:no erythema, induration, or nodules. Trace right upper extremity edema.   Lab Results: Lab Results  Component Value Date   WBC 12.8* 02/21/2014   HGB 11.5* 02/21/2014   HCT 34.1* 02/21/2014   MCV 89.5 02/21/2014   PLT 237 02/21/2014     Chemistry      Component Value Date/Time   NA 132* 02/21/2014 0940   NA 137 10/17/2013 1706   K 4.4 02/21/2014 0940   K 4.0 10/17/2013 1706   CL 102 10/17/2013 1706   CO2 23 02/21/2014 0940   CO2 24 10/17/2013 1706   BUN 16.8 02/21/2014 0940   BUN 17 10/17/2013 1706   CREATININE 1.0 02/21/2014 0940   CREATININE 1.1 10/17/2013 1706      Component Value Date/Time   CALCIUM 9.8 02/21/2014 0940   CALCIUM 9.6 10/17/2013 1706   ALKPHOS 89 02/21/2014 0940   ALKPHOS 78 10/17/2013 1706   AST 17 02/21/2014 0940   AST 18 10/17/2013 1706   ALT 29 02/21/2014 0940   ALT 25 10/17/2013 1706   BILITOT 0.33 02/21/2014 0940   BILITOT 0.5 10/17/2013 1706     EXAM:  CT CHEST WITH CONTRAST  CT ABDOMEN WITHOUT AND WITH CONTRAST  TECHNIQUE:  Multidetector CT imaging of the abdomen was performed without  intravenous contrast. Multidetector CT imaging of the chest and  abdomen was then performed during bolus administration of  intravenous contrast.  CONTRAST: 1109mL OMNIPAQUE IOHEXOL 300 MG/ML SOLN  COMPARISON: 11/18/2013  FINDINGS:  CT CHEST FINDINGS  Soft tissue / Mediastinum: There is no axillary lymphadenopathy. No  mediastinal or hilar lymphadenopathy. Heart size is normal. No  pericardial effusion.  Lungs / Pleura: Lung windows show emphysema. Respiratory motion  obscures fine parenchymal detail. Within the motion artifact, no  pulmonary parenchymal nodule or mass is evident. There is some  subsegmental atelectasis or linear scar in the right middle  lobe and  both lower lobes. No focal airspace consolidation. No evidence for  pulmonary edema. Debris in the right mainstem bronchus presumably  represents mucous although inhaled foreign body is not excluded.  Bones: Bone windows reveal no worrisome lytic or sclerotic osseous  lesions.  CT ABDOMEN FINDINGS  Liver: No focal abnormality.  Spleen: 2.0 cm heterogeneous lesion in the anterior spleen has  increased slightly in size in the interval.  Stomach: Stomach is unremarkable.  Pancreas: There is mild edema/ inflammation associated with the  uncinate process of the pancreas and pancreatic head. There are some  scattered areas of edema/inflammation along the body and tail of  pancreas is well. No dilatation of the main pancreatic duct.  Gallbladder/Biliary: The gallbladder is surgically absent. No intra  or extrahepatic biliary duct dilatation.  Kidneys/Adrenals: Right adrenal gland is normal. Left adrenal gland  enhancing nodule measures 12 mm on today's study, not substantially  changed in the interval. 8 mm low-density lesion in the lower  pole  the right kidney is stable. Left kidney is surgically absent. The  wispy soft tissue in the left nephrectomy bed is stable.  Bowel Loops: Small duodenum diverticulum noted in the third portion  of the duodenum. No small bowel obstruction. Terminal ileum is  normal. Appendix is normal. No evidence for colonic mass.  Nodes: No retroperitoneal lymphadenopathy. 9 mm nodule adjacent to  the inferior liver on image 102 is stable. The 2.1 cm omental nodule  seen previously now shows central necrosis and measures 1.8 cm.  Previous study showed a 3.4 x 2.3 cm lesion in the right paracolic  gutter. This has become necrotic centrally and has decreased in  size, measuring 2.4 x 1.7 cm on the current study.  Vasculature: Atherosclerotic calcification is noted in the wall of  the abdominal aorta without aneurysm.  Pelvic Genitourinary: Bladder is  unremarkable. Mild prostatic  enlargement is evident.  Bones/Musculoskeletal: Bone windows reveal no worrisome lytic or  sclerotic osseous lesions.  Body Wall: No evidence for abdominal wall hernia.  Other: No intraperitoneal free fluid.  IMPRESSION:  1. Interval decrease in size of the omental and right peritoneal  nodules. A small nodule adjacent inferior tip of the right liver is  stable.  2. Slight interval decrease in size of left adrenal nodule.  3. Heterogeneous splenic lesion has increased slightly in size in  the interval. Close continued attention to this region is  recommended.  4. Interval development of subtle peripancreatic edema/inflammation.  A component of pancreatitis is suspected. The majority of these  changes are centered around the uncinate process and head of  pancreas, immediately adjacent to the duodenum which shows a  diverticulum in this same region. While these changes could be  related to duodenal diverticulitis or duodenitis, given that the  edema/inflammation seen adjacent to the distal body of pancreas and  pancreatic tail, the edema/ inflammation is felt to be of pancreatic  etiology.  5. Stable appearance of a tiny right lower pole renal lesion.    Impression and Plan:  58 year old gentleman with the following issues:  1. Renal cell carcinoma diagnosed with a left renal mass in July of 2013 and after a nephrectomy developed peritoneal and retroperitoneal metastasis. He was started on Votrient in May of 2015 but was discontinued due to poor tolerance. Even at 50% of the dose he could not take the medication. His CT scan on 02/21/2014 was discussed today and showed interval response to therapy. Options of treatments were discussed today with the patient and his wife. I recommended may be restarting Votrient a 25% of the dose versus switching him to a different medication such as Sutent. The other option would be also observation and surveillance. After  discussing the risks and benefits of all these approaches patient elected to proceed with supportive care only. He understands he has limited life expectancy but he prefers the quality of life associated with taking no medication. I will certainly support his decision and I will continue to follow him clinically every 3-4 months.  2. Fatigue and periorbital edema: This is most likely related to Votrient which is improved at this time since stopping the medication.  3. Decreased oral intake: His creatinine is normal at 1.0.  4. Hypertension: His lisinopril was increased by his primary care physician. His blood pressure is excellent at 138/74. His blood pressure will likely be better since he is off Votrient and might require titrating his blood pressure medication down.  [facility], MD 7/17/20159:16 AM

## 2014-02-24 NOTE — Telephone Encounter (Signed)
gv adn printed appt sched adn avs for pt for OCT... °

## 2014-03-06 ENCOUNTER — Other Ambulatory Visit: Payer: Self-pay | Admitting: Family Medicine

## 2014-05-25 ENCOUNTER — Telehealth: Payer: Self-pay | Admitting: Oncology

## 2014-05-25 ENCOUNTER — Other Ambulatory Visit (HOSPITAL_BASED_OUTPATIENT_CLINIC_OR_DEPARTMENT_OTHER): Payer: Commercial Managed Care - HMO

## 2014-05-25 ENCOUNTER — Ambulatory Visit (HOSPITAL_BASED_OUTPATIENT_CLINIC_OR_DEPARTMENT_OTHER): Payer: Commercial Managed Care - HMO | Admitting: Oncology

## 2014-05-25 VITALS — BP 140/61 | HR 67 | Temp 97.7°F | Resp 18 | Ht 72.0 in

## 2014-05-25 DIAGNOSIS — C649 Malignant neoplasm of unspecified kidney, except renal pelvis: Secondary | ICD-10-CM

## 2014-05-25 DIAGNOSIS — C642 Malignant neoplasm of left kidney, except renal pelvis: Secondary | ICD-10-CM

## 2014-05-25 DIAGNOSIS — C786 Secondary malignant neoplasm of retroperitoneum and peritoneum: Secondary | ICD-10-CM

## 2014-05-25 LAB — COMPREHENSIVE METABOLIC PANEL (CC13)
ALBUMIN: 3.1 g/dL — AB (ref 3.5–5.0)
ALK PHOS: 86 U/L (ref 40–150)
ALT: 28 U/L (ref 0–55)
AST: 16 U/L (ref 5–34)
Anion Gap: 8 mEq/L (ref 3–11)
BUN: 24.6 mg/dL (ref 7.0–26.0)
CO2: 21 mEq/L — ABNORMAL LOW (ref 22–29)
CREATININE: 1.3 mg/dL (ref 0.7–1.3)
Calcium: 9.5 mg/dL (ref 8.4–10.4)
Chloride: 104 mEq/L (ref 98–109)
GLUCOSE: 113 mg/dL (ref 70–140)
POTASSIUM: 5.3 meq/L — AB (ref 3.5–5.1)
Sodium: 134 mEq/L — ABNORMAL LOW (ref 136–145)
Total Bilirubin: 0.23 mg/dL (ref 0.20–1.20)
Total Protein: 7.1 g/dL (ref 6.4–8.3)

## 2014-05-25 LAB — CBC WITH DIFFERENTIAL/PLATELET
BASO%: 0.4 % (ref 0.0–2.0)
BASOS ABS: 0.1 10*3/uL (ref 0.0–0.1)
EOS%: 1.5 % (ref 0.0–7.0)
Eosinophils Absolute: 0.2 10*3/uL (ref 0.0–0.5)
HEMATOCRIT: 34.8 % — AB (ref 38.4–49.9)
HGB: 11.3 g/dL — ABNORMAL LOW (ref 13.0–17.1)
LYMPH#: 1.3 10*3/uL (ref 0.9–3.3)
LYMPH%: 11.5 % — AB (ref 14.0–49.0)
MCH: 29.6 pg (ref 27.2–33.4)
MCHC: 32.5 g/dL (ref 32.0–36.0)
MCV: 91.1 fL (ref 79.3–98.0)
MONO#: 0.9 10*3/uL (ref 0.1–0.9)
MONO%: 8.3 % (ref 0.0–14.0)
NEUT#: 8.8 10*3/uL — ABNORMAL HIGH (ref 1.5–6.5)
NEUT%: 78.3 % — AB (ref 39.0–75.0)
Platelets: 218 10*3/uL (ref 140–400)
RBC: 3.82 10*6/uL — ABNORMAL LOW (ref 4.20–5.82)
RDW: 15.4 % — ABNORMAL HIGH (ref 11.0–14.6)
WBC: 11.3 10*3/uL — ABNORMAL HIGH (ref 4.0–10.3)
nRBC: 0 % (ref 0–0)

## 2014-05-25 NOTE — Telephone Encounter (Signed)
Gave avs & appt d/t. No new imaging orders per Dr. Christiana Fuchs

## 2014-05-25 NOTE — Progress Notes (Signed)
Hematology and Oncology Follow Up Visit  Austin [name] [id number] 05-Jan-1956 58 y.o. 05/25/2014 3:42 PM Austin [name], MDBurchette, Austin Sierras, MD   Principle Diagnosis: 58 year old gentleman with a renal cell carcinoma diagnosed in 2013 he presented with a 4.2 cm mass in the lower pole of the left kidney. Now he has stage IV disease.   Prior Therapy: He is status [name] robotic-assisted laparoscopic nephrectomy on the left done on 04/23/2012. The pathology revealed renal cell carcinoma with a grade 3/4 with the pathological staging of T3a.   Current therapy: Votrient 800 mg daily started around 12/27/2013. This was reduced to 400 mg subsequently and have been discontinued since 02/03/2014 due to poor tolerance.  Interim History: Austin [name] presents today for a followup visit. Since his last visit, he continued to be off Votrient. He has some fatigue and tiredness. Which is much improved off the medication. He does report occasional bone pain in response to add to your medication. He is not reporting any abdominal pain or discomfort. Has not reported any flank pain or hematuria. Has not reported chest pain or difficulty breathing. Has not reported any deterioration in his health status. 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 He has not reported any hospitalization or illnesses. He has not reported any arthralgias or myalgias. As that report any lymphadenopathy or petechiae. Is not reporting any bleeding complications. Rest of review of systems unremarkable.  Medications: I have reviewed the patient's current medications.  Current Outpatient Prescriptions  Medication Sig Dispense Refill  . atorvastatin (LIPITOR) 20 MG tablet Take 20 mg by mouth every morning.      . clopidogrel (PLAVIX) 75 MG tablet Take 1 tablet (75 mg total) by mouth at bedtime.  90 tablet  2  . CRANBERRY PO Take 1 tablet by mouth 2 (two) times daily.       Marland Kitchen escitalopram (LEXAPRO) 10 MG tablet Take 10 mg by mouth every morning.      Marland Kitchen ketoconazole (NIZORAL) 2 % cream Apply 1 application topically daily as needed for irritation.      Marland Kitchen ketoconazole (NIZORAL) 2 % cream APPLY  AS NEEDED TO RASH  60 g  1  . lisinopril-hydrochlorothiazide (PRINZIDE,ZESTORETIC) 20-12.5 MG per tablet 2 tablets daily. One po bid      . metFORMIN (GLUCOPHAGE) 500 MG tablet Take 500 mg by mouth 2 (two) times daily with a meal.      . Multiple Vitamin (MULTIVITAMIN WITH MINERALS) TABS tablet Take 1 tablet by mouth daily.      Marland Kitchen testosterone enanthate (DELATESTRYL) 200 MG/ML injection Inject 200 mg into the muscle every 21 ( twenty-one) days. For IM use only      . traZODone (DESYREL) 100 MG tablet Take 100 mg by mouth at bedtime.      . triamcinolone cream (KENALOG) 0.1 % Apply 1 application topically daily as needed.       No current facility-administered medications for this visit.     Allergies:  Allergies  Allergen Reactions  . Penicillins     REACTION: rash    Past Medical History, Surgical history, Social history, and Family History were reviewed and updated.    Remaining ROS negative. Physical Exam: Blood pressure 140/61, pulse 67, temperature 97.7 F (36.5 C), temperature source Oral, resp. rate 18, height 6' (1.829 m), weight 0 lb (0 kg). ECOG: 2 General appearance: alert Head: Normocephalic, without obvious abnormality. Periorbital edema resolved.  Neck:  no adenopathy Lymph nodes: Cervical, supraclavicular, and axillary nodes normal. Heart:regular rate and rhythm, S1, S2 normal, no murmur, click, rub or gallop Lung:chest clear, no wheezing, rales, no dullness to percussion the Abdomin: soft, non-tender, without masses or organomegaly EXT:no erythema, induration, or nodules. Trace right upper extremity edema.   Lab Results: Lab Results  Component Value Date   WBC 11.3* 05/25/2014   HGB 11.3* 05/25/2014   HCT 34.8* 05/25/2014   MCV 91.1  05/25/2014   PLT 218 05/25/2014     Chemistry      Component Value Date/Time   NA 134* 05/25/2014 1457   NA 137 10/17/2013 1706   K 5.3* 05/25/2014 1457   K 4.0 10/17/2013 1706   CL 102 10/17/2013 1706   CO2 21* 05/25/2014 1457   CO2 24 10/17/2013 1706   BUN 24.6 05/25/2014 1457   BUN 17 10/17/2013 1706   CREATININE 1.3 05/25/2014 1457   CREATININE 1.1 10/17/2013 1706      Component Value Date/Time   CALCIUM 9.5 05/25/2014 1457   CALCIUM 9.6 10/17/2013 1706   ALKPHOS 86 05/25/2014 1457   ALKPHOS 78 10/17/2013 1706   AST 16 05/25/2014 1457   AST 18 10/17/2013 1706   ALT 28 05/25/2014 1457   ALT 25 10/17/2013 1706   BILITOT 0.23 05/25/2014 1457   BILITOT 0.5 10/17/2013 1706      Impression and Plan:  58 year old gentleman with the following issues:  1. Renal cell carcinoma diagnosed with a left renal mass in July of 2013 and after a nephrectomy developed peritoneal and retroperitoneal metastasis. He was started on Votrient in May of 2015 but was discontinued due to poor tolerance. CT scan on 02/21/2014  showed interval response to therapy. Options of treatments were discussed today with the patient and his wife. He continues to prefer supportive care only and declined restarting his medication. The plan is to repeat CT scan as needed if symptoms arise. He will continue with supportive care only address symptoms as they arise. We also discussed the role of hospice which will refer her him to once he develops further decline.  2. Fatigue and periorbital edema: This is most likely related to Votrient which is improved.  3. Decreased oral intake: His creatinine is normal at 1.0.  4. Hypertension: Blood pressure control at this time.  5. Followup: 3 months or sooner if needed to.   Zola Button, MD 10/15/20153:42 PM

## 2014-06-01 ENCOUNTER — Encounter: Payer: Self-pay | Admitting: Family Medicine

## 2014-06-01 ENCOUNTER — Ambulatory Visit (INDEPENDENT_AMBULATORY_CARE_PROVIDER_SITE_OTHER): Payer: Commercial Managed Care - HMO | Admitting: Family Medicine

## 2014-06-01 VITALS — BP 130/70 | HR 76 | Temp 97.7°F

## 2014-06-01 DIAGNOSIS — D443 Neoplasm of uncertain behavior of pituitary gland: Secondary | ICD-10-CM

## 2014-06-01 DIAGNOSIS — I10 Essential (primary) hypertension: Secondary | ICD-10-CM

## 2014-06-01 DIAGNOSIS — R7989 Other specified abnormal findings of blood chemistry: Secondary | ICD-10-CM

## 2014-06-01 DIAGNOSIS — Z23 Encounter for immunization: Secondary | ICD-10-CM

## 2014-06-01 DIAGNOSIS — E119 Type 2 diabetes mellitus without complications: Secondary | ICD-10-CM

## 2014-06-01 DIAGNOSIS — D497 Neoplasm of unspecified behavior of endocrine glands and other parts of nervous system: Secondary | ICD-10-CM

## 2014-06-01 DIAGNOSIS — E291 Testicular hypofunction: Secondary | ICD-10-CM

## 2014-06-01 LAB — TESTOSTERONE: Testosterone: 771.54 ng/dL (ref 300.00–890.00)

## 2014-06-01 LAB — TSH: TSH: 1.44 u[IU]/mL (ref 0.35–4.50)

## 2014-06-01 LAB — HEMOGLOBIN A1C: HEMOGLOBIN A1C: 6.3 % (ref 4.6–6.5)

## 2014-06-01 MED ORDER — TESTOSTERONE ENANTHATE 200 MG/ML IM SOLN: 200.0000 mg | INTRAMUSCULAR | Status: DC

## 2014-06-01 NOTE — Progress Notes (Signed)
Pre visit review using our clinic review tool, if applicable. No additional management support is needed unless otherwise documented below in the visit note. 

## 2014-06-02 NOTE — Progress Notes (Signed)
Subjective:    Patient ID: Austin [name], male    DOB: 12-16-1955, 58 y.o.   MRN: [id number]  HPI Patient here for medical followup. He has chronic problems of type 2 diabetes, hypertension, hyperlipidemia, low testosterone, history of pituitary tumor, metastatic renal cell carcinoma, and history of CVA. He is followed by oncology and has decided against any further treatments for his renal cell carcinoma. He is coping fairly well. Very supportive wife. Appetite is fair. Weight is stable.  Type 2 diabetes. Not monitoring regularly. History of good control. No symptoms of hypoglycemia.  He takes metformin only at this point.  Low testosterone on every three-week intramuscular replacement. Has history of pituitary tumor but no history of panhypopituitarism. Needs refills of testosterone today.  Hypertension treated with lisinopril HCTZ. Blood pressure stable. Needs flu vaccine.  Past Medical History  Diagnosis Date  . Hx of colonic polyps   . Hypertension   . Pituitary adenoma   . Urinary incontinence   . Venous stasis     edema  . 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 Hypogonadism   . Neuromuscular disorder     quadraplegic  . Chronic kidney disease     Left Renal Mass  . Sleep apnea     stopbang=4  . Cerebrovascular accident 1994    hx of brainstem stroke, residual diminished lung capacity  . 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 Diabetes mellitus without complication    Past Surgical History  Procedure Laterality Date  . Tracheostomy tube placement  02/1993    removed 04/1993  . Stomach peg  02/1993    removed 5-6 years later  . Cholecystectomy    . Pituitary surgery  2007, 2011  . Gamma knife radiation surgery  05/2010    for pituitary  . Colonoscopy  02/27/2012    Procedure: COLONOSCOPY;  Surgeon: Jerene Bears, MD;  Location: [facility];  Service: Gastroenterology;  Laterality: N/A;  .  Vocal cord surgery      injected with collagen, then fat from stomach to improve speech s/p stroke  . Vasectomy  1984  . Robotic partial nephrectomy  04-23-12    left   Left Renal Mass  . Robot assisted laparoscopic nephrectomy  04/23/2012    Procedure: ROBOTIC ASSISTED LAPAROSCOPIC NEPHRECTOMY;  Surgeon: Alexis Frock, MD;  Location: [facility];  Service: Urology;  Laterality: Left;  radical  . Umbilical hernia repair  04/23/2012    Procedure: HERNIA REPAIR UMBILICAL ADULT;  Surgeon: Alexis Frock, MD;  Location: [facility];  Service: Urology;;    reports that he quit smoking about 21 years ago. His smoking use included Cigarettes. He has a 11.5 pack-year smoking history. He has never used smokeless tobacco. He reports that he does not drink alcohol or use illicit drugs. family history includes Alcohol abuse in his father and father; Arthritis in his maternal grandmother and mother; Colon cancer in his brother; Esophageal cancer (age of onset: 38) in his father; Hyperlipidemia in his mother; Hypertension in his brother; Stroke in his paternal grandmother; Throat cancer in his father; Uterine cancer (age of onset: 45) in his mother. Allergies  Allergen Reactions  . Penicillins     REACTION: rash      Review of Systems  Constitutional: Positive for fatigue.  HENT: Negative for trouble swallowing.   Eyes: Negative for visual disturbance.  Respiratory: Negative for cough, chest tightness and  shortness of breath.   Cardiovascular: Negative for chest pain, palpitations and leg swelling.  Gastrointestinal: Negative for abdominal pain.  Endocrine: Negative for polydipsia and polyuria.  Neurological: Negative for dizziness, syncope, light-headedness and headaches.  Psychiatric/Behavioral: Negative for confusion.       Objective:   Physical Exam  Constitutional: He is oriented to person, place, and time. He appears well-developed and well-nourished.  HENT:  Right Ear: External ear normal.  Left  Ear: External ear normal.  Mouth/Throat: Oropharynx is clear and moist.  Neck: Neck supple.  Cardiovascular: Normal rate and regular rhythm.   Pulmonary/Chest: Effort normal and breath sounds normal. No respiratory distress. He has no wheezes. He has no rales.  Musculoskeletal: He exhibits no edema.  Neurological: He is alert and oriented to person, place, and time.  Skin: No rash noted.          Assessment & Plan:  #1 type 2 diabetes. History of good control. Recheck A1c #2 low testosterone. Recheck levels. Refills given #3 history of pituitary tumor. Screening TSH  #4 hypertension which is stable and at goal. Continue current medication #5 health maintenance. Flu vaccine given. He has received previous Pneumovax.

## 2014-06-27 ENCOUNTER — Other Ambulatory Visit (INDEPENDENT_AMBULATORY_CARE_PROVIDER_SITE_OTHER): Payer: Commercial Managed Care - HMO

## 2014-06-27 DIAGNOSIS — R1030 Lower abdominal pain, unspecified: Secondary | ICD-10-CM

## 2014-06-27 LAB — POCT URINALYSIS DIPSTICK
Bilirubin, UA: NEGATIVE
Blood, UA: NEGATIVE
Glucose, UA: NEGATIVE
Ketones, UA: NEGATIVE
Leukocytes, UA: NEGATIVE
Nitrite, UA: POSITIVE
PROTEIN UA: NEGATIVE
SPEC GRAV UA: 1.015
Urobilinogen, UA: 0.2
pH, UA: 5.5

## 2014-06-28 ENCOUNTER — Other Ambulatory Visit: Payer: Self-pay | Admitting: Family Medicine

## 2014-06-28 DIAGNOSIS — R109 Unspecified abdominal pain: Secondary | ICD-10-CM

## 2014-06-29 ENCOUNTER — Other Ambulatory Visit: Payer: Commercial Managed Care - HMO

## 2014-06-29 DIAGNOSIS — R109 Unspecified abdominal pain: Secondary | ICD-10-CM

## 2014-07-02 LAB — URINE CULTURE

## 2014-07-03 ENCOUNTER — Other Ambulatory Visit: Payer: Self-pay

## 2014-07-03 MED ORDER — CIPROFLOXACIN HCL 500 MG PO TABS: 500.0000 mg | ORAL_TABLET | Freq: Two times a day (BID) | ORAL | Status: DC

## 2014-07-10 ENCOUNTER — Other Ambulatory Visit: Payer: Self-pay | Admitting: Family Medicine

## 2014-07-27 ENCOUNTER — Other Ambulatory Visit: Payer: Self-pay | Admitting: Family Medicine

## 2014-08-14 ENCOUNTER — Telehealth: Payer: Self-pay | Admitting: *Deleted

## 2014-08-14 NOTE — Telephone Encounter (Signed)
Spouse Lukah Goswami called and left message on voice mail.  Retrieved at 5:00 pm.  "Austin [name] is scheduled to see you on 08-24-2014.  He is not feeling well.  Can he have scans scheduled so we can see what's going on?"  Please call [phone].  Called [place] for more information.  "Problems off and on since first of December but getting worse.  He C/O being cold.  Despite lots of blankets he says he's cold internally.  Does not have a fever.  He is a quadriplegic and temperature = 96.7 and 97.  He aches and hurts everywhere.  Takes 3 Advil or tylenol with a little relief.  Eats and drinks well.  Bowels are erratic with no movement and then three to four in a day.  Didn't want to wait until he's seen and try to get something scheduled so I'm letting you know in advance."  Will notify providers.

## 2014-08-15 ENCOUNTER — Other Ambulatory Visit: Payer: Self-pay | Admitting: Oncology

## 2014-08-15 ENCOUNTER — Telehealth: Payer: Self-pay | Admitting: *Deleted

## 2014-08-15 DIAGNOSIS — C649 Malignant neoplasm of unspecified kidney, except renal pelvis: Secondary | ICD-10-CM

## 2014-08-15 NOTE — Telephone Encounter (Signed)
Called and spoke with wife and let her know that Dr. Alen Blew is ordering scans before his next visit.  Wife is aware of new schedule with labs and CT scan on 1/08/12/14 before visit on 08/24/14.  She appreciated the phone call.

## 2014-08-22 ENCOUNTER — Ambulatory Visit (HOSPITAL_COMMUNITY)
Admission: RE | Admit: 2014-08-22 | Discharge: 2014-08-22 | Disposition: A | Discharge status: Home / Self Care | Payer: PPO | Source: Ambulatory Visit | Attending: Oncology | Admitting: Oncology

## 2014-08-22 ENCOUNTER — Other Ambulatory Visit (HOSPITAL_BASED_OUTPATIENT_CLINIC_OR_DEPARTMENT_OTHER): Payer: PPO

## 2014-08-22 DIAGNOSIS — C642 Malignant neoplasm of left kidney, except renal pelvis: Secondary | ICD-10-CM

## 2014-08-22 DIAGNOSIS — C649 Malignant neoplasm of unspecified kidney, except renal pelvis: Secondary | ICD-10-CM | POA: Diagnosis present

## 2014-08-22 DIAGNOSIS — D72829 Elevated white blood cell count, unspecified: Secondary | ICD-10-CM | POA: Insufficient documentation

## 2014-08-22 DIAGNOSIS — C786 Secondary malignant neoplasm of retroperitoneum and peritoneum: Secondary | ICD-10-CM

## 2014-08-22 LAB — COMPREHENSIVE METABOLIC PANEL (CC13)
ALT: 20 U/L (ref 0–55)
ANION GAP: 12 meq/L — AB (ref 3–11)
AST: 15 U/L (ref 5–34)
Albumin: 3.4 g/dL — ABNORMAL LOW (ref 3.5–5.0)
Alkaline Phosphatase: 98 U/L (ref 40–150)
BUN: 19.1 mg/dL (ref 7.0–26.0)
CHLORIDE: 99 meq/L (ref 98–109)
CO2: 23 meq/L (ref 22–29)
CREATININE: 1.3 mg/dL (ref 0.7–1.3)
Calcium: 9 mg/dL (ref 8.4–10.4)
EGFR: 62 mL/min/{1.73_m2} — ABNORMAL LOW (ref >90)
GLUCOSE: 98 mg/dL (ref 70–140)
Potassium: 4.5 mEq/L (ref 3.5–5.1)
Sodium: 134 mEq/L — ABNORMAL LOW (ref 136–145)
Total Bilirubin: 0.31 mg/dL (ref 0.20–1.20)
Total Protein: 7.7 g/dL (ref 6.4–8.3)

## 2014-08-22 LAB — CBC WITH DIFFERENTIAL/PLATELET
BASO%: 0.5 % (ref 0.0–2.0)
Basophils Absolute: 0.1 10*3/uL (ref 0.0–0.1)
EOS%: 2.6 % (ref 0.0–7.0)
Eosinophils Absolute: 0.4 10*3/uL (ref 0.0–0.5)
HCT: 35.9 % — ABNORMAL LOW (ref 38.4–49.9)
HGB: 11.9 g/dL — ABNORMAL LOW (ref 13.0–17.1)
LYMPH%: 12.2 % — AB (ref 14.0–49.0)
MCH: 29.6 pg (ref 27.2–33.4)
MCHC: 33.1 g/dL (ref 32.0–36.0)
MCV: 89.3 fL (ref 79.3–98.0)
MONO#: 1.1 10*3/uL — AB (ref 0.1–0.9)
MONO%: 7.4 % (ref 0.0–14.0)
NEUT#: 11.6 10*3/uL — ABNORMAL HIGH (ref 1.5–6.5)
NEUT%: 77.3 % — AB (ref 39.0–75.0)
PLATELETS: 274 10*3/uL (ref 140–400)
RBC: 4.02 10*6/uL — AB (ref 4.20–5.82)
RDW: 17.3 % — ABNORMAL HIGH (ref 11.0–14.6)
WBC: 15 10*3/uL — ABNORMAL HIGH (ref 4.0–10.3)
lymph#: 1.8 10*3/uL (ref 0.9–3.3)

## 2014-08-22 MED ORDER — IOHEXOL 300 MG/ML  SOLN
100.0000 mL | Freq: Once | INTRAMUSCULAR | Status: AC | PRN
  Administered 2014-08-22: 100 mL via INTRAVENOUS

## 2014-08-24 ENCOUNTER — Other Ambulatory Visit: Payer: Commercial Managed Care - HMO

## 2014-08-24 ENCOUNTER — Telehealth: Payer: Self-pay | Admitting: Oncology

## 2014-08-24 ENCOUNTER — Ambulatory Visit (HOSPITAL_BASED_OUTPATIENT_CLINIC_OR_DEPARTMENT_OTHER): Payer: PPO | Admitting: Oncology

## 2014-08-24 VITALS — BP 131/70 | HR 77 | Temp 97.7°F | Resp 18 | Ht 72.0 in

## 2014-08-24 DIAGNOSIS — C649 Malignant neoplasm of unspecified kidney, except renal pelvis: Secondary | ICD-10-CM

## 2014-08-24 DIAGNOSIS — C786 Secondary malignant neoplasm of retroperitoneum and peritoneum: Secondary | ICD-10-CM

## 2014-08-24 DIAGNOSIS — C642 Malignant neoplasm of left kidney, except renal pelvis: Secondary | ICD-10-CM

## 2014-08-24 NOTE — Progress Notes (Signed)
Hematology and Oncology Follow Up Visit  Austin [name] [id number] July 30, 1956 59 y.o. 08/24/2014 9:58 AM [name], MDBurchette, [name], MD   Principle Diagnosis: 59 year old gentleman with a renal cell carcinoma diagnosed in 2013 he presented with a 4.2 cm mass in the lower pole of the left kidney. Now he has stage IV disease.   Prior Therapy: He is status post robotic-assisted laparoscopic nephrectomy on the left done on 04/23/2012. The pathology revealed renal cell carcinoma with a grade 3/4 with the pathological staging of T3a.   Current therapy: Votrient 800 mg daily started around 12/27/2013. This was reduced to 400 mg subsequently and have been discontinued since 02/03/2014 due to poor tolerance.  Interim History: Austin [name] presents today for a followup visit. Since his last visit, he is reporting more decline. He reports more fatigue and tiredness. He also has generalized weakness in his upper extremities. His appetite is reasonable but have declined as well. He is not reporting any abdominal pain or discomfort. He is reporting slight distention and early satiety. Has not reported any flank pain or hematuria. Has not reported chest pain or difficulty breathing. Has not reported any deterioration in his health status. 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 He has not reported any hospitalization or illnesses. He has not reported any arthralgias or myalgias. As that report any lymphadenopathy or petechiae. Is not reporting any bleeding complications. Rest of review of systems unremarkable.  Medications: I have reviewed the patient's current medications.  Current Outpatient Prescriptions  Medication Sig Dispense Refill  . atorvastatin (LIPITOR) 20 MG tablet Take 20 mg by mouth every morning.    . ciprofloxacin (CIPRO) 500 MG tablet Take 1 tablet (500 mg total) by mouth 2 (two) times daily. 14 tablet 0  .  clopidogrel (PLAVIX) 75 MG tablet TAKE 1 TABLET AT BEDTIME 90 tablet 2  . CRANBERRY PO Take 1 tablet by mouth 2 (two) times daily.    Marland Kitchen escitalopram (LEXAPRO) 10 MG tablet Take 10 mg by mouth every morning.    Marland Kitchen ketoconazole (NIZORAL) 2 % cream Apply 1 application topically daily as needed for irritation.    Marland Kitchen ketoconazole (NIZORAL) 2 % cream APPLY  AS NEEDED TO RASH 60 g 1  . ketoconazole (NIZORAL) 2 % cream APPLY  AS NEEDED TO RASH 60 g 2  . lisinopril-hydrochlorothiazide (PRINZIDE,ZESTORETIC) 20-12.5 MG per tablet 2 tablets daily. One po bid    . metFORMIN (GLUCOPHAGE) 500 MG tablet Take 500 mg by mouth 2 (two) times daily with a meal.    . Multiple Vitamin (MULTIVITAMIN WITH MINERALS) TABS tablet Take 1 tablet by mouth daily.    Marland Kitchen testosterone enanthate (DELATESTRYL) 200 MG/ML injection Inject 1 mL (200 mg total) into the muscle every 21 ( twenty-one) days. For IM use only 15 mL 5  . traZODone (DESYREL) 100 MG tablet Take 100 mg by mouth at bedtime.    . triamcinolone cream (KENALOG) 0.1 % Apply 1 application topically daily as needed.     No current facility-administered medications for this visit.     Allergies:  Allergies  Allergen Reactions  . Penicillins     REACTION: rash    Past Medical History, Surgical history, Social history, and Family History were reviewed and updated.    Physical Exam: Blood pressure 131/70, pulse 77, temperature 97.7 F (36.5 C), temperature source Oral, resp. rate 18, height 6' (1.829 m), weight 0 lb (0 kg),  SpO2 100 %. ECOG: 2 General appearance: alert awake not in any distress today. Head: Normocephalic, without obvious abnormality. Periorbital edema minimal at this time. Neck: no adenopathy Lymph nodes: Cervical, supraclavicular, and axillary nodes normal. Heart:regular rate and rhythm, S1, S2 normal, no murmur, click, rub or gallop Lung:chest clear, no wheezing, rales, no dullness to percussion the Abdomin: soft, non-tender, without masses  or organomegaly EXT:no erythema, induration, or nodules. Trace right upper extremity edema.   Lab Results: Lab Results  Component Value Date   WBC 15.0* 08/22/2014   HGB 11.9* 08/22/2014   HCT 35.9* 08/22/2014   MCV 89.3 08/22/2014   PLT 274 08/22/2014     Chemistry      Component Value Date/Time   NA 134* 08/22/2014 1547   NA 137 10/17/2013 1706   K 4.5 08/22/2014 1547   K 4.0 10/17/2013 1706   CL 102 10/17/2013 1706   CO2 23 08/22/2014 1547   CO2 24 10/17/2013 1706   BUN 19.1 08/22/2014 1547   BUN 17 10/17/2013 1706   CREATININE 1.3 08/22/2014 1547   CREATININE 1.1 10/17/2013 1706      Component Value Date/Time   CALCIUM 9.0 08/22/2014 1547   CALCIUM 9.6 10/17/2013 1706   ALKPHOS 98 08/22/2014 1547   ALKPHOS 78 10/17/2013 1706   AST 15 08/22/2014 1547   AST 18 10/17/2013 1706   ALT 20 08/22/2014 1547   ALT 25 10/17/2013 1706   BILITOT 0.31 08/22/2014 1547   BILITOT 0.5 10/17/2013 1706      EXAM: CT CHEST, ABDOMEN, AND PELVIS WITH CONTRAST  TECHNIQUE: Multidetector CT imaging of the chest, abdomen and pelvis was performed following the standard protocol during bolus administration of intravenous contrast.  CONTRAST: 135mL OMNIPAQUE IOHEXOL 300 MG/ML SOLN  COMPARISON: 02/21/2014.  FINDINGS: CT CHEST FINDINGS  Mediastinum/Nodes: No pathologically enlarged mediastinal, hilar or axillary lymph nodes. Atherosclerotic calcification of the arterial vasculature. Heart size normal. No pericardial effusion.  Lungs/Pleura: Image quality is degraded by respiratory motion. Mild scattered pulmonary parenchymal scarring in the lower hemithoraces bilaterally. Lungs are otherwise grossly clear. No pleural fluid. Airway is unremarkable.  Musculoskeletal: No worrisome lytic or sclerotic lesions. Degenerative changes are seen in the spine.  CT ABDOMEN AND PELVIS FINDINGS  Hepatobiliary: Liver is unremarkable. Cholecystectomy. No biliary ductal  dilatation.  Pancreas: Negative.  Spleen: A heterogeneous low-attenuation mass in the spleen measures 3.2 cm (previously 2.0 cm).  Adrenals/Urinary Tract: Right adrenal gland is unremarkable. Hyper attenuating left adrenal nodule measures 12 mm, stable. Low-attenuation lesion in the inferior pole of the right kidney measure 7 mm, stable. Right kidney is otherwise unremarkable. Postoperative changes of left nephrectomy with fat necrosis in the surgical bed, stable. Right ureter is decompressed. Bladder is grossly unremarkable.  Stomach/Bowel: Stomach is unremarkable. Duodenal diverticulum is incidentally noted. Small bowel, appendix and colon are otherwise unremarkable.  Vascular/Lymphatic: Atherosclerotic calcification of the arterial vasculature without abdominal aortic aneurysm. No pathologically enlarged lymph nodes.  Reproductive: Prostate is normal in size.  Other: An omental mass in the supraumbilical midline measures 4.1 x 3.6 cm (image 82), previously 1.8 x 1.8 cm. A peritoneal nodule in the lateral right abdomen, below the level of the kidneys, measures 2.9 x 3.7 cm (image 87), previously 1.7 x 2.4 cm. Small nodules are seen along the inferior margin of the liver and in the right perinephric region, measuring up to 10 mm, stable (images 59 and 73). Midline periumbilical hernia contains a knuckle of small bowel. No free fluid.  Musculoskeletal: No worrisome lytic or sclerotic lesions. Degenerative changes are seen in the spine.  IMPRESSION: 1. Progressive omental/peritoneal metastatic disease. 2. Enlarging splenic lesion, worrisome for metastatic disease. 3. Stable left adrenal nodule. 4. No evidence of metastatic disease in the chest.    Impression and Plan:  59 year old gentleman with the following issues:  1. Renal cell carcinoma diagnosed with a left renal mass in July of 2013 and after a nephrectomy developed peritoneal and retroperitoneal  metastasis. He was started on Votrient in May of 2015 but was discontinued due to poor tolerance. CT scan on 08/22/2014 was discussed today and predictably showed progressive disease predominantly peritoneum and omentum.  Patient have refused further systemic therapy and currently on supportive care only. I suspect his decline in his performance status is likely related to his disease progression. Despite that, his continue his reasonable quality of life and performance status and does not need hospice at this time.  2. Fatigue and functional decline: Likely related to his malignancy.  3. Decreased oral intake: This improved slightly but I continue to encourage him to use nutritional supplements.  4. Hypertension: Blood pressure control at this time.  5. Followup: 4 months or sooner if needed to.   [facility], MD 1/14/20169:58 AM

## 2014-08-24 NOTE — Telephone Encounter (Signed)
Gave avs & cal for May.

## 2014-09-18 ENCOUNTER — Other Ambulatory Visit: Payer: Self-pay | Admitting: Family Medicine

## 2014-09-18 ENCOUNTER — Telehealth: Payer: Self-pay | Admitting: Family Medicine

## 2014-09-18 DIAGNOSIS — R300 Dysuria: Secondary | ICD-10-CM

## 2014-09-18 NOTE — Telephone Encounter (Signed)
OK to check UA and culture for any fever or other concerns.

## 2014-09-18 NOTE — Telephone Encounter (Signed)
Pt wife is aware and Lab is ordered.

## 2014-09-18 NOTE — Telephone Encounter (Signed)
Pt wife thinks her husband has another bladder infection and would like to bring urine sample. Please advise

## 2014-09-19 ENCOUNTER — Other Ambulatory Visit (INDEPENDENT_AMBULATORY_CARE_PROVIDER_SITE_OTHER): Payer: PPO

## 2014-09-19 DIAGNOSIS — R300 Dysuria: Secondary | ICD-10-CM

## 2014-09-19 LAB — POCT URINALYSIS DIPSTICK
Bilirubin, UA: NEGATIVE
Glucose, UA: NEGATIVE
KETONES UA: NEGATIVE
Nitrite, UA: NEGATIVE
PH UA: 5.5
Protein, UA: NEGATIVE
RBC UA: NEGATIVE
SPEC GRAV UA: 1.01
UROBILINOGEN UA: 0.2

## 2014-09-22 ENCOUNTER — Other Ambulatory Visit: Payer: Self-pay

## 2014-09-22 LAB — URINE CULTURE

## 2014-09-22 MED ORDER — CIPROFLOXACIN HCL 500 MG PO TABS: 500.0000 mg | ORAL_TABLET | Freq: Two times a day (BID) | ORAL | Status: DC

## 2014-11-06 ENCOUNTER — Encounter: Payer: Self-pay | Admitting: Oncology

## 2014-11-06 NOTE — Progress Notes (Signed)
The wife Peter Congo left a message. I called back and I confirmed the correct insurance. She said she got bill and it said the old insurance?? I advised her to make sure to ck wth billing on what they have in system, as to [facility] has it correct.

## 2014-11-21 ENCOUNTER — Ambulatory Visit (INDEPENDENT_AMBULATORY_CARE_PROVIDER_SITE_OTHER): Payer: PPO | Admitting: Family Medicine

## 2014-11-21 ENCOUNTER — Encounter: Payer: Self-pay | Admitting: Family Medicine

## 2014-11-21 VITALS — BP 130/70 | HR 77 | Temp 98.0°F

## 2014-11-21 DIAGNOSIS — E785 Hyperlipidemia, unspecified: Secondary | ICD-10-CM | POA: Diagnosis not present

## 2014-11-21 DIAGNOSIS — I10 Essential (primary) hypertension: Secondary | ICD-10-CM | POA: Diagnosis not present

## 2014-11-21 DIAGNOSIS — E119 Type 2 diabetes mellitus without complications: Secondary | ICD-10-CM

## 2014-11-21 DIAGNOSIS — R531 Weakness: Secondary | ICD-10-CM

## 2014-11-21 LAB — LIPID PANEL
Cholesterol: 170 mg/dL (ref 0–200)
HDL: 37.7 mg/dL — ABNORMAL LOW (ref >39.00)
Total CHOL/HDL Ratio: 5

## 2014-11-21 LAB — LDL CHOLESTEROL, DIRECT: LDL DIRECT: 80 mg/dL

## 2014-11-21 LAB — HEMOGLOBIN A1C: HEMOGLOBIN A1C: 6.6 % — AB (ref 4.6–6.5)

## 2014-11-21 NOTE — Patient Instructions (Signed)
We will call you about referral for OT and PT consult.

## 2014-11-21 NOTE — Progress Notes (Signed)
Pre visit review using our clinic review tool, if applicable. No additional management support is needed unless otherwise documented below in the visit note.  Patient is not able to weigh.

## 2014-11-21 NOTE — Progress Notes (Signed)
Subjective:    Patient ID: Austin [name], male    DOB: 1955-11-04, 59 y.o.   MRN: [id number]  HPI Patient seen for routine medical follow-up. He has metastatic renal cell carcinoma and has decided against any further treatment. His other chronic problems include history of pituitary tumor, previous brainstem CVA, hypertension, type 2 diabetes, low testosterone, neurogenic bladder.  He's had some decreased appetite which has gone on for several months. Wife states that he generally only eats about one good meal per day. He's had some occasional dry cough recently but does have some persistent postnasal drip symptoms. He's been reluctant to take regular antihistamines.  Type 2 diabetes. Generally well-controlled. They've not been monitoring blood sugars. No polyuria or polydipsia. Remains on metformin.  Hyperlipidemia treated with atorvastatin. Compliant with medications. Blood pressure has been stable with lisinopril HCTZ  Wife is having increasing difficulties with transfers. She does not have any equipment to help her with this. He is in the past been able to bear some weight but this is becoming more more difficult. She would like to consider physical therapy and occupational therapy consults.  Past Medical History  Diagnosis Date  . Hx of colonic polyps   . Hypertension   . Pituitary adenoma   . Urinary incontinence   . Venous stasis     edema  . 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 Hypogonadism   . Neuromuscular disorder     quadraplegic  . Chronic kidney disease     Left Renal Mass  . Sleep apnea     stopbang=4  . Cerebrovascular accident 1994    hx of brainstem stroke, residual diminished lung capacity  . 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 Diabetes mellitus without complication    Past Surgical History  Procedure Laterality Date  . Tracheostomy tube placement  02/1993    removed 04/1993  .  Stomach peg  02/1993    removed 5-6 years later  . Cholecystectomy    . Pituitary surgery  2007, 2011  . Gamma knife radiation surgery  05/2010    for pituitary  . Colonoscopy  02/27/2012    Procedure: COLONOSCOPY;  Surgeon: Jerene Bears, MD;  Location: [facility];  Service: Gastroenterology;  Laterality: N/A;  . Vocal cord surgery      injected with collagen, then fat from stomach to improve speech s/p stroke  . Vasectomy  1984  . Robotic partial nephrectomy  04-23-12    left   Left Renal Mass  . Robot assisted laparoscopic nephrectomy  04/23/2012    Procedure: ROBOTIC ASSISTED LAPAROSCOPIC NEPHRECTOMY;  Surgeon: Alexis Frock, MD;  Location: [facility];  Service: Urology;  Laterality: Left;  radical  . Umbilical hernia repair  04/23/2012    Procedure: HERNIA REPAIR UMBILICAL ADULT;  Surgeon: Alexis Frock, MD;  Location: [facility];  Service: Urology;;    reports that he quit smoking about 22 years ago. His smoking use included Cigarettes. He has a 11.5 pack-year smoking history. He has never used smokeless tobacco. He reports that he does not drink alcohol or use illicit drugs. family history includes Alcohol abuse in his father and father; Arthritis in his maternal grandmother and mother; Colon cancer in his brother; Esophageal cancer (age of onset: 97) in his father; Hyperlipidemia in his mother; Hypertension in his brother; Stroke in his paternal grandmother; Throat cancer in his father; Uterine cancer (age  of onset: 2) in his mother. Allergies  Allergen Reactions  . Penicillins     REACTION: rash        Review of Systems  Constitutional: Positive for appetite change and fatigue. Negative for fever and chills.  Eyes: Negative for visual disturbance.  Respiratory: Negative for cough, chest tightness and shortness of breath.   Cardiovascular: Negative for chest pain, palpitations and leg swelling.  Gastrointestinal: Negative for nausea and vomiting.  Neurological: Positive for  weakness. Negative for dizziness, syncope, light-headedness and headaches.  Psychiatric/Behavioral: Negative for confusion.       Objective:   Physical Exam  Constitutional: He is oriented to person, place, and time. He appears well-developed and well-nourished. No distress.  HENT:  Mouth/Throat: Oropharynx is clear and moist.  Neck: Neck supple. No JVD present.  Cardiovascular: Normal rate and regular rhythm.   Pulmonary/Chest: Effort normal and breath sounds normal. No respiratory distress. He has no wheezes. He has no rales.  Musculoskeletal: He exhibits edema.  Neurological: He is alert and oriented to person, place, and time.          Assessment & Plan:  #1 type 2 diabetes. History of good control. Recheck A1c #2 hypertension adequately controlled and stable #3 generalized weakness. Set up home consultation for PT and OT. Wife needing increased assistance with transfers #4 hyperlipidemia. Recheck lipid panel. She gets regular CBC and comprehensive metabolic panel through the cancer center so no hepatic panel ordered today. #5 low testosterone.  Pt and wife discussed possible d/c testosterone.  We discussed basically this is a quality of life issue whether to continue.  Since he has significant fatigue issues he is not sure he wishes to stop yet.  Levels were normal last Fall.

## 2014-11-23 ENCOUNTER — Other Ambulatory Visit: Payer: Self-pay

## 2014-11-23 ENCOUNTER — Telehealth: Payer: Self-pay | Admitting: Family Medicine

## 2014-11-23 DIAGNOSIS — C649 Malignant neoplasm of unspecified kidney, except renal pelvis: Secondary | ICD-10-CM

## 2014-11-23 MED ORDER — CLOPIDOGREL BISULFATE 75 MG PO TABS: 75.0000 mg | ORAL_TABLET | Freq: Every day | ORAL | Status: DC

## 2014-11-23 MED ORDER — ESCITALOPRAM OXALATE 10 MG PO TABS: 10.0000 mg | ORAL_TABLET | Freq: Every morning | ORAL | Status: DC

## 2014-11-23 MED ORDER — LISINOPRIL-HYDROCHLOROTHIAZIDE 20-12.5 MG PO TABS: 1.0000 | ORAL_TABLET | Freq: Two times a day (BID) | ORAL | Status: DC

## 2014-11-23 MED ORDER — TRAZODONE HCL 100 MG PO TABS: 100.0000 mg | ORAL_TABLET | Freq: Every day | ORAL | Status: DC

## 2014-11-23 MED ORDER — METFORMIN HCL 500 MG PO TABS: 500.0000 mg | ORAL_TABLET | Freq: Two times a day (BID) | ORAL | Status: DC

## 2014-11-23 MED ORDER — ATORVASTATIN CALCIUM 20 MG PO TABS: 20.0000 mg | ORAL_TABLET | Freq: Every morning | ORAL | Status: DC

## 2014-11-23 NOTE — Telephone Encounter (Signed)
Can we find company that can provide PT and OT help?  They really need assistance of OT.  Don't understand "they can't staff OT in his area".Marland Kitchen

## 2014-11-23 NOTE — Telephone Encounter (Signed)
Melissa states they received a referral for PT and OT and they can't staff OT in his area.  They can staff PT only, would like to know if that is okay?  She said she is in Epic and you can message her back the answer.

## 2014-11-24 ENCOUNTER — Other Ambulatory Visit: Payer: Self-pay | Admitting: Family Medicine

## 2014-11-24 DIAGNOSIS — R531 Weakness: Secondary | ICD-10-CM

## 2014-11-24 NOTE — Telephone Encounter (Signed)
Place another order to [facility] or [place]

## 2014-12-15 ENCOUNTER — Telehealth: Payer: Self-pay | Admitting: Family Medicine

## 2014-12-15 NOTE — Telephone Encounter (Signed)
OK 

## 2014-12-15 NOTE — Telephone Encounter (Signed)
Pt needs order fax to [facility] [phone] for wheel chair repairs

## 2014-12-18 NOTE — Telephone Encounter (Signed)
Faxed order to [facility]

## 2014-12-21 ENCOUNTER — Telehealth: Payer: Self-pay | Admitting: Oncology

## 2014-12-21 ENCOUNTER — Ambulatory Visit (HOSPITAL_BASED_OUTPATIENT_CLINIC_OR_DEPARTMENT_OTHER): Payer: PPO | Admitting: Oncology

## 2014-12-21 ENCOUNTER — Other Ambulatory Visit (HOSPITAL_BASED_OUTPATIENT_CLINIC_OR_DEPARTMENT_OTHER): Payer: PPO

## 2014-12-21 VITALS — BP 148/76 | HR 73 | Temp 98.0°F | Resp 16 | Ht 72.0 in

## 2014-12-21 DIAGNOSIS — C649 Malignant neoplasm of unspecified kidney, except renal pelvis: Secondary | ICD-10-CM

## 2014-12-21 DIAGNOSIS — C786 Secondary malignant neoplasm of retroperitoneum and peritoneum: Secondary | ICD-10-CM | POA: Diagnosis not present

## 2014-12-21 DIAGNOSIS — C642 Malignant neoplasm of left kidney, except renal pelvis: Secondary | ICD-10-CM

## 2014-12-21 LAB — CBC WITH DIFFERENTIAL/PLATELET
BASO%: 0.8 % (ref 0.0–2.0)
BASOS ABS: 0.1 10*3/uL (ref 0.0–0.1)
EOS%: 2.6 % (ref 0.0–7.0)
Eosinophils Absolute: 0.4 10*3/uL (ref 0.0–0.5)
HEMATOCRIT: 37.6 % — AB (ref 38.4–49.9)
HEMOGLOBIN: 12.3 g/dL — AB (ref 13.0–17.1)
LYMPH#: 1.2 10*3/uL (ref 0.9–3.3)
LYMPH%: 8.4 % — AB (ref 14.0–49.0)
MCH: 28.8 pg (ref 27.2–33.4)
MCHC: 32.7 g/dL (ref 32.0–36.0)
MCV: 87.9 fL (ref 79.3–98.0)
MONO#: 1.1 10*3/uL — AB (ref 0.1–0.9)
MONO%: 8.1 % (ref 0.0–14.0)
NEUT#: 11 10*3/uL — ABNORMAL HIGH (ref 1.5–6.5)
NEUT%: 80.1 % — ABNORMAL HIGH (ref 39.0–75.0)
Platelets: 211 10*3/uL (ref 140–400)
RBC: 4.27 10*6/uL (ref 4.20–5.82)
RDW: 17.1 % — ABNORMAL HIGH (ref 11.0–14.6)
WBC: 13.7 10*3/uL — AB (ref 4.0–10.3)

## 2014-12-21 LAB — COMPREHENSIVE METABOLIC PANEL (CC13)
ALT: 17 U/L (ref 0–55)
ANION GAP: 13 meq/L — AB (ref 3–11)
AST: 12 U/L (ref 5–34)
Albumin: 3.1 g/dL — ABNORMAL LOW (ref 3.5–5.0)
Alkaline Phosphatase: 97 U/L (ref 40–150)
BILIRUBIN TOTAL: 0.32 mg/dL (ref 0.20–1.20)
BUN: 22.8 mg/dL (ref 7.0–26.0)
CALCIUM: 9.4 mg/dL (ref 8.4–10.4)
CHLORIDE: 101 meq/L (ref 98–109)
CO2: 23 meq/L (ref 22–29)
CREATININE: 1.3 mg/dL (ref 0.7–1.3)
EGFR: 60 mL/min/{1.73_m2} — ABNORMAL LOW (ref >90)
Glucose: 116 mg/dl (ref 70–140)
Potassium: 4.7 mEq/L (ref 3.5–5.1)
Sodium: 137 mEq/L (ref 136–145)
Total Protein: 7.4 g/dL (ref 6.4–8.3)

## 2014-12-21 NOTE — Progress Notes (Signed)
Hematology and Oncology Follow Up Visit  Austin [name] [id number] [birth year] 59 y.o. 12/21/2014 9:54 AM Austin [name], MDBurchette, [name], MD   Principle Diagnosis: 59 year old gentleman with a renal cell carcinoma diagnosed in 2013 he presented with a 4.2 cm mass in the lower pole of the left kidney. Now he has stage IV disease.   Prior Therapy: He is status [name] robotic-assisted laparoscopic nephrectomy on the left done on 04/23/2012. The pathology revealed renal cell carcinoma with a grade 3/4 with the pathological staging of T3a.   Current therapy: Votrient 800 mg daily started around 12/27/2013. This was reduced to 400 mg subsequently and have been discontinued since 02/03/2014 due to poor tolerance.  Interim History: Austin [name] presents today for a followup visit. Since his last visit, he has been relatively stable. He has reported that lower extremity edema and takes diuretics as needed. He reports more fatigue and tiredness has not dramatically changed since the last visit. He has reported ductal constant pain in his abdomen but for the last few days have subsided. He reported that Tylenol and Advil have helped his pain and he takes needed at this time. His appetite is reasonable. He is reporting slight distention and early satiety. Has not reported any flank pain or hematuria. Has not reported chest pain or difficulty breathing. Has not reported any deterioration in his health status. 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 He has not reported any hospitalization or illnesses. He has not reported any arthralgias or myalgias. As that report any lymphadenopathy or petechiae. Is not reporting any bleeding complications. Rest of review of systems unremarkable.  Medications: I have reviewed the patient's current medications.  Current Outpatient Prescriptions  Medication Sig Dispense Refill  . atorvastatin (LIPITOR)  20 MG tablet Take 1 tablet (20 mg total) by mouth every morning. 90 tablet 2  . clopidogrel (PLAVIX) 75 MG tablet Take 1 tablet (75 mg total) by mouth at bedtime. 90 tablet 2  . CRANBERRY PO Take 1 tablet by mouth 2 (two) times daily.    Marland Kitchen escitalopram (LEXAPRO) 10 MG tablet Take 1 tablet (10 mg total) by mouth every morning. 90 tablet 2  . ketoconazole (NIZORAL) 2 % cream APPLY  AS NEEDED TO RASH 60 g 2  . lisinopril-hydrochlorothiazide (PRINZIDE,ZESTORETIC) 20-12.5 MG per tablet Take 1 tablet by mouth 2 (two) times daily. 180 tablet 2  . metFORMIN (GLUCOPHAGE) 500 MG tablet Take 1 tablet (500 mg total) by mouth 2 (two) times daily with a meal. 180 tablet 2  . Multiple Vitamin (MULTIVITAMIN WITH MINERALS) TABS tablet Take 1 tablet by mouth daily.    Marland Kitchen testosterone enanthate (DELATESTRYL) 200 MG/ML injection Inject 1 mL (200 mg total) into the muscle every 21 ( twenty-one) days. For IM use only 15 mL 5  . traZODone (DESYREL) 100 MG tablet Take 1 tablet (100 mg total) by mouth at bedtime. 90 tablet 2  . triamcinolone cream (KENALOG) 0.1 % Apply 1 application topically daily as needed.     No current facility-administered medications for this visit.     Allergies:  Allergies  Allergen Reactions  . Penicillins     REACTION: rash    Past Medical History, Surgical history, Social history, and Family History were reviewed and updated.    Physical Exam: Blood pressure 148/76, pulse 73, temperature 98 F (36.7 C), temperature source Oral, resp. rate 16, height 6' (1.829 m), SpO2 100 %. ECOG:  2 General appearance: alert awake not in any distress today. Head: Normocephalic, without obvious abnormality.  Neck: no adenopathy Lymph nodes: Cervical, supraclavicular, and axillary nodes normal. Heart:regular rate and rhythm, S1, S2 normal, no murmur, click, rub or gallop Lung:chest clear, no wheezing, rales, no dullness to percussion the Abdomin: soft, non-tender, without masses or  organomegaly EXT: Bilateral lower showed edema noted.    Lab Results: Lab Results  Component Value Date   WBC 13.7* 12/21/2014   HGB 12.3* 12/21/2014   HCT 37.6* 12/21/2014   MCV 87.9 12/21/2014   PLT 211 12/21/2014     Chemistry      Component Value Date/Time   NA 134* 08/22/2014 1547   NA 137 10/17/2013 1706   K 4.5 08/22/2014 1547   K 4.0 10/17/2013 1706   CL 102 10/17/2013 1706   CO2 23 08/22/2014 1547   CO2 24 10/17/2013 1706   BUN 19.1 08/22/2014 1547   BUN 17 10/17/2013 1706   CREATININE 1.3 08/22/2014 1547   CREATININE 1.1 10/17/2013 1706      Component Value Date/Time   CALCIUM 9.0 08/22/2014 1547   CALCIUM 9.6 10/17/2013 1706   ALKPHOS 98 08/22/2014 1547   ALKPHOS 78 10/17/2013 1706   AST 15 08/22/2014 1547   AST 18 10/17/2013 1706   ALT 20 08/22/2014 1547   ALT 25 10/17/2013 1706   BILITOT 0.31 08/22/2014 1547   BILITOT 0.5 10/17/2013 1706         Impression and Plan:  59 year old gentleman with the following issues:  1. Renal cell carcinoma diagnosed with a left renal mass in July of 2013 and after a nephrectomy developed peritoneal and retroperitoneal metastasis. He was started on Votrient in May of 2015 but was discontinued due to poor tolerance. CT scan on 08/22/2014 was discussed today and predictably showed progressive disease predominantly peritoneum and omentum.  Patient have refused further systemic therapy and currently on supportive care only. He seems to be rather stable at this time without any major changes in his health status. He declined hospice at this time as his condition have been stable. Him and his wife understand unrealistic about his prognosis and willing to have hospice if needed to.  The plan is to continue with the current approach and obtain imaging studies as needed.   2. Fatigue and functional decline: Likely related to his malignancy. seems to be stable.  3. abdominal pain: Could be related to his malignancy but  seems to have improved since last time.   4. Hypertension: Blood pressure control at this time.  5. Followup: 4 months or sooner if needed to.   [facility], MD 5/12/20169:54 AM

## 2014-12-21 NOTE — Telephone Encounter (Signed)
Pt confirmed labs/ov per 05/12 POF, gave pt AVS and Calendar.... KJ °

## 2015-01-11 ENCOUNTER — Ambulatory Visit (INDEPENDENT_AMBULATORY_CARE_PROVIDER_SITE_OTHER): Payer: PPO | Admitting: Family Medicine

## 2015-01-11 ENCOUNTER — Encounter: Payer: Self-pay | Admitting: Family Medicine

## 2015-01-11 VITALS — BP 130/70 | HR 87 | Temp 98.1°F

## 2015-01-11 DIAGNOSIS — E119 Type 2 diabetes mellitus without complications: Secondary | ICD-10-CM | POA: Diagnosis not present

## 2015-01-11 DIAGNOSIS — R300 Dysuria: Secondary | ICD-10-CM

## 2015-01-11 DIAGNOSIS — J029 Acute pharyngitis, unspecified: Secondary | ICD-10-CM

## 2015-01-11 LAB — POCT URINALYSIS DIPSTICK
Bilirubin, UA: NEGATIVE
GLUCOSE UA: NEGATIVE
Ketones, UA: NEGATIVE
Nitrite, UA: NEGATIVE
PH UA: 7
Spec Grav, UA: 1.015
Urobilinogen, UA: 0.2

## 2015-01-11 LAB — POCT RAPID STREP A (OFFICE): RAPID STREP A SCREEN: NEGATIVE

## 2015-01-11 MED ORDER — LEVOFLOXACIN 500 MG PO TABS: 500.0000 mg | ORAL_TABLET | Freq: Every day | ORAL | Status: DC

## 2015-01-11 NOTE — Progress Notes (Signed)
Subjective:    Patient ID: Austin [name], male    DOB: 1955/12/31, 59 y.o.   MRN: [id number]  HPI  patient here with symptoms of cough, rhinorrhea, fatigue, low-grade fever and body aches. has also some sore throat and intermittent headache. Grandson last week had strep pharyngitis and they keep him each day. He's not had any rash. No nausea or vomiting. Cough mostly nonproductive.  history complicated by the fact that  he has history of stroke , pituitary tumor , metastatic renal cell carcinoma.   He has neurogenic bladder and history of recurrent UTI. Wife is concerned because his urine is cloudy , foul odor , and increased urine frequency over baseline.  Type 2 diabetes which is been stable recently. A1c 6.6% in April.  Past Medical History  Diagnosis Date  . Hx of colonic polyps   . Hypertension   . Pituitary adenoma   . Urinary incontinence   . Venous stasis     edema  . 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 Hypogonadism   . Neuromuscular disorder     quadraplegic  . Chronic kidney disease     Left Renal Mass  . Sleep apnea     stopbang=4  . Cerebrovascular accident 1994    hx of brainstem stroke, residual diminished lung capacity  . 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 Diabetes mellitus without complication    Past Surgical History  Procedure Laterality Date  . Tracheostomy tube placement  02/1993    removed 04/1993  . Stomach peg  02/1993    removed 5-6 years later  . Cholecystectomy    . Pituitary surgery  2007, 2011  . Gamma knife radiation surgery  05/2010    for pituitary  . Colonoscopy  02/27/2012    Procedure: COLONOSCOPY;  Surgeon: Jerene Bears, MD;  Location: [facility];  Service: Gastroenterology;  Laterality: N/A;  . Vocal cord surgery      injected with collagen, then fat from stomach to improve speech s/p stroke  . Vasectomy  1984  . Robotic partial nephrectomy  04-23-12     left   Left Renal Mass  . Robot assisted laparoscopic nephrectomy  04/23/2012    Procedure: ROBOTIC ASSISTED LAPAROSCOPIC NEPHRECTOMY;  Surgeon: Alexis Frock, MD;  Location: [facility];  Service: Urology;  Laterality: Left;  radical  . Umbilical hernia repair  04/23/2012    Procedure: HERNIA REPAIR UMBILICAL ADULT;  Surgeon: Alexis Frock, MD;  Location: [facility];  Service: Urology;;    reports that he quit smoking about 22 years ago. His smoking use included Cigarettes. He has a 11.5 pack-year smoking history. He has never used smokeless tobacco. He reports that he does not drink alcohol or use illicit drugs. family history includes Alcohol abuse in his father and father; Arthritis in his maternal grandmother and mother; Colon cancer in his brother; Esophageal cancer (age of onset: 33) in his father; Hyperlipidemia in his mother; Hypertension in his brother; Stroke in his paternal grandmother; Throat cancer in his father; Uterine cancer (age of onset: 34) in his mother. Allergies  Allergen Reactions  . Penicillins     REACTION: rash      Review of Systems  Constitutional: Positive for fever, chills and fatigue.  HENT: Positive for congestion and sore throat.   Respiratory: Positive for cough.   Gastrointestinal: Negative for abdominal pain.  Genitourinary: Positive for dysuria  and frequency. Negative for hematuria.  Musculoskeletal: Positive for myalgias.  Neurological: Positive for headaches.  Psychiatric/Behavioral: Negative for confusion.       Objective:   Physical Exam  Constitutional: He appears well-developed and well-nourished. No distress.  HENT:  Right Ear: External ear normal.  Left Ear: External ear normal.  Mouth/Throat: Oropharynx is clear and moist.  Neck: Neck supple.  Cardiovascular: Normal rate and regular rhythm.   Pulmonary/Chest: Effort normal and breath sounds normal. No respiratory distress. He has no wheezes. He has no rales.  Lymphadenopathy:    He has  no cervical adenopathy.  Skin: No rash noted.          Assessment & Plan:   #1 upper respiratory symptoms. Suspect viral. Recent exposure to strep. Check rapid strep screen. Rapid strep negative  #2 dysuria. High-risk colonization.  Recent low-grade fever which may be related to #1 but cannot rule out urinary tract infection. Urine culture sent. Levaquin 500 milligrams daily for 7 days #3 type 2 diabetes. Stable.  Continue current medications

## 2015-01-11 NOTE — Progress Notes (Signed)
Pre visit review using our clinic review tool, if applicable. No additional management support is needed unless otherwise documented below in the visit note. 

## 2015-01-14 LAB — URINE CULTURE: Colony Count: >=100000

## 2015-01-22 ENCOUNTER — Telehealth: Payer: Self-pay | Admitting: Family Medicine

## 2015-01-22 MED ORDER — CIPROFLOXACIN HCL 500 MG PO TABS: 500.0000 mg | ORAL_TABLET | Freq: Two times a day (BID) | ORAL | Status: DC

## 2015-01-22 NOTE — Telephone Encounter (Signed)
Pt seen 6/2 and was advised to cb if not better. Pt still has sore throat, productive cough  AND pt not 100% sure the bladder inf is completely gone either.  [email or web address]

## 2015-01-22 NOTE — Telephone Encounter (Signed)
Rx sent and patient is aware.

## 2015-01-22 NOTE — Telephone Encounter (Signed)
Cipro 500 mg po bid for 7 days.

## 2015-02-06 ENCOUNTER — Telehealth: Payer: Self-pay | Admitting: Family Medicine

## 2015-02-06 NOTE — Telephone Encounter (Signed)
Pt call to say that they need a rx for wheel chair repair.   [facility] ;  [employer] [phone]

## 2015-02-07 NOTE — Telephone Encounter (Signed)
Faxed Rx to [facility]

## 2015-02-21 ENCOUNTER — Encounter: Payer: Self-pay | Admitting: Family Medicine

## 2015-02-21 ENCOUNTER — Ambulatory Visit (INDEPENDENT_AMBULATORY_CARE_PROVIDER_SITE_OTHER): Payer: PPO | Admitting: Family Medicine

## 2015-02-21 VITALS — BP 130/80 | HR 78 | Temp 97.8°F

## 2015-02-21 DIAGNOSIS — R0600 Dyspnea, unspecified: Secondary | ICD-10-CM

## 2015-02-21 DIAGNOSIS — I10 Essential (primary) hypertension: Secondary | ICD-10-CM

## 2015-02-21 DIAGNOSIS — E119 Type 2 diabetes mellitus without complications: Secondary | ICD-10-CM

## 2015-02-21 NOTE — Progress Notes (Signed)
Pre visit review using our clinic review tool, if applicable. No additional management support is needed unless otherwise documented below in the visit note. 

## 2015-02-21 NOTE — Progress Notes (Signed)
   Subjective:    Patient ID: Austin [name], male    DOB: 17-Feb-1956, 59 y.o.   MRN: [id number]  HPI Multiple chronic problems including history of type 2 diabetes, hyperlipidemia, renal cell carcinoma, history of brainstem CVA, history of pituitary tumor. He has metastatic renal cell and is seen today with some dyspnea over the past several weeks. Occasional cough. No hemoptysis. No fevers or chills. No chest pains. He had CT chest back in January which showed no metastatic disease in the chest region. No pleuritic pain.  Patient was on chemotherapy briefly but had adverse effects and they have decided not to pursue any further chemotherapy at this time. He does have scheduled follow-up with oncology  Type 2 diabetes. Well controlled. A1c 6.6% April. Not monitoring sugars regularly. No hypoglycemia  Hypertension. Treated with lisinopril HCTZ. Blood pressure stable. No recent headaches. He has some mild chronic peripheral edema   Review of Systems  Constitutional: Negative for fatigue.  Eyes: Negative for visual disturbance.  Respiratory: Positive for cough and shortness of breath. Negative for chest tightness.   Cardiovascular: Negative for chest pain, palpitations and leg swelling.  Endocrine: Negative for polydipsia and polyuria.  Neurological: Negative for dizziness, syncope, weakness, light-headedness and headaches.       Objective:   Physical Exam  Constitutional: He is oriented to person, place, and time. He appears well-developed and well-nourished.  Neck: No JVD present.  Cardiovascular: Normal rate and regular rhythm.   Pulmonary/Chest: Effort normal and breath sounds normal. No respiratory distress. He has no wheezes. He has no rales.  Musculoskeletal: He exhibits edema.  Only trace edema legs bilaterally which is chronic and unchanged  Neurological: He is alert and oriented to person, place, and time.          Assessment & Plan:  #1 dyspnea. Pulse oximetry 97%.  Lung exam unremarkable. We discussed the fact he could have metastatic disease in the lung this point they're not interested in pursuing chest x-ray or further evaluation. They wish to observe. He does not have any tachycardia, pleuritic pain, hypoxia, etc to suggest likely PE. #2 type 2 diabetes. History of good control. Recheck A1c in follow-up in 3 months #3 hypertension stable and at goal. Continue current blood pressure medication

## 2015-03-06 ENCOUNTER — Other Ambulatory Visit: Payer: Self-pay | Admitting: Family Medicine

## 2015-03-21 ENCOUNTER — Other Ambulatory Visit (INDEPENDENT_AMBULATORY_CARE_PROVIDER_SITE_OTHER): Payer: PPO

## 2015-03-21 DIAGNOSIS — R300 Dysuria: Secondary | ICD-10-CM | POA: Diagnosis not present

## 2015-03-21 LAB — POCT URINALYSIS DIPSTICK
BILIRUBIN UA: NEGATIVE
GLUCOSE UA: NEGATIVE
Ketones, UA: NEGATIVE
NITRITE UA: NEGATIVE
PROTEIN UA: NEGATIVE
SPEC GRAV UA: 1.015
UROBILINOGEN UA: 0.2
pH, UA: 6

## 2015-03-24 LAB — URINE CULTURE

## 2015-03-26 ENCOUNTER — Other Ambulatory Visit: Payer: Self-pay

## 2015-03-26 MED ORDER — SULFAMETHOXAZOLE-TRIMETHOPRIM 800-160 MG PO TABS: 1.0000 | ORAL_TABLET | Freq: Two times a day (BID) | ORAL | Status: DC

## 2015-04-06 ENCOUNTER — Telehealth: Payer: Self-pay | Admitting: *Deleted

## 2015-04-06 NOTE — Telephone Encounter (Signed)
Received emergency page stating that pt had chest pain, and shortness of breath.  Spoke with wife and was informed that pt did not have any chest pain, occasional shortness of breath but nothing new and md aware of this.  Main concern from pt and wife was that pt has office visit with Dr. Alen Blew on 04/25/15;  However, pt wanted to know if he should have CT scans before visit to make sure nothing else going on the chest, or any other areas of the body.  Informed wife that message will be relayed to Dr. Alen Blew.  Pt will be contacted with further instructions from md.  Wife voiced understanding. Gloria's   Phone     [phone].

## 2015-04-09 ENCOUNTER — Encounter: Payer: Self-pay | Admitting: *Deleted

## 2015-04-10 ENCOUNTER — Other Ambulatory Visit: Payer: Self-pay | Admitting: Oncology

## 2015-04-10 ENCOUNTER — Telehealth: Payer: Self-pay | Admitting: *Deleted

## 2015-04-10 DIAGNOSIS — C649 Malignant neoplasm of unspecified kidney, except renal pelvis: Secondary | ICD-10-CM

## 2015-04-10 NOTE — Telephone Encounter (Signed)
Called wife Peter Congo, dr Alen Blew has ordered a CT  Scan Chest, abd and pelvis  before next visit. Central scheduling will be calling with a date and time.

## 2015-04-23 ENCOUNTER — Other Ambulatory Visit: Payer: Self-pay | Admitting: Oncology

## 2015-04-23 ENCOUNTER — Encounter (HOSPITAL_COMMUNITY): Payer: Self-pay

## 2015-04-23 ENCOUNTER — Ambulatory Visit (HOSPITAL_COMMUNITY)
Admission: RE | Admit: 2015-04-23 | Discharge: 2015-04-23 | Disposition: A | Discharge status: Home / Self Care | Payer: PPO | Source: Ambulatory Visit | Attending: Oncology | Admitting: Oncology

## 2015-04-23 ENCOUNTER — Other Ambulatory Visit (HOSPITAL_BASED_OUTPATIENT_CLINIC_OR_DEPARTMENT_OTHER): Payer: PPO

## 2015-04-23 DIAGNOSIS — N62 Hypertrophy of breast: Secondary | ICD-10-CM | POA: Insufficient documentation

## 2015-04-23 DIAGNOSIS — C649 Malignant neoplasm of unspecified kidney, except renal pelvis: Secondary | ICD-10-CM

## 2015-04-23 DIAGNOSIS — C642 Malignant neoplasm of left kidney, except renal pelvis: Secondary | ICD-10-CM

## 2015-04-23 DIAGNOSIS — C786 Secondary malignant neoplasm of retroperitoneum and peritoneum: Secondary | ICD-10-CM | POA: Insufficient documentation

## 2015-04-23 DIAGNOSIS — J439 Emphysema, unspecified: Secondary | ICD-10-CM | POA: Insufficient documentation

## 2015-04-23 DIAGNOSIS — I2510 Atherosclerotic heart disease of native coronary artery without angina pectoris: Secondary | ICD-10-CM | POA: Insufficient documentation

## 2015-04-23 DIAGNOSIS — Z905 Acquired absence of kidney: Secondary | ICD-10-CM | POA: Diagnosis not present

## 2015-04-23 LAB — COMPREHENSIVE METABOLIC PANEL (CC13)
ALBUMIN: 3.4 g/dL — AB (ref 3.5–5.0)
ALK PHOS: 103 U/L (ref 40–150)
ALT: 20 U/L (ref 0–55)
AST: 13 U/L (ref 5–34)
Anion Gap: 12 mEq/L — ABNORMAL HIGH (ref 3–11)
BILIRUBIN TOTAL: 0.24 mg/dL (ref 0.20–1.20)
BUN: 30.6 mg/dL — ABNORMAL HIGH (ref 7.0–26.0)
CO2: 22 meq/L (ref 22–29)
CREATININE: 1.5 mg/dL — AB (ref 0.7–1.3)
Calcium: 10 mg/dL (ref 8.4–10.4)
Chloride: 103 mEq/L (ref 98–109)
EGFR: 50 mL/min/{1.73_m2} — ABNORMAL LOW (ref >90)
GLUCOSE: 98 mg/dL (ref 70–140)
Potassium: 4.9 mEq/L (ref 3.5–5.1)
SODIUM: 137 meq/L (ref 136–145)
Total Protein: 7.9 g/dL (ref 6.4–8.3)

## 2015-04-23 LAB — CBC WITH DIFFERENTIAL/PLATELET
BASO%: 0.6 % (ref 0.0–2.0)
BASOS ABS: 0.1 10*3/uL (ref 0.0–0.1)
EOS%: 5 % (ref 0.0–7.0)
Eosinophils Absolute: 0.6 10*3/uL — ABNORMAL HIGH (ref 0.0–0.5)
HCT: 34.8 % — ABNORMAL LOW (ref 38.4–49.9)
HGB: 11.3 g/dL — ABNORMAL LOW (ref 13.0–17.1)
LYMPH%: 10.7 % — AB (ref 14.0–49.0)
MCH: 29 pg (ref 27.2–33.4)
MCHC: 32.5 g/dL (ref 32.0–36.0)
MCV: 89.2 fL (ref 79.3–98.0)
MONO#: 1 10*3/uL — ABNORMAL HIGH (ref 0.1–0.9)
MONO%: 8 % (ref 0.0–14.0)
NEUT#: 9.6 10*3/uL — ABNORMAL HIGH (ref 1.5–6.5)
NEUT%: 75.7 % — ABNORMAL HIGH (ref 39.0–75.0)
Platelets: 248 10*3/uL (ref 140–400)
RBC: 3.9 10*6/uL — AB (ref 4.20–5.82)
RDW: 16.6 % — ABNORMAL HIGH (ref 11.0–14.6)
WBC: 12.7 10*3/uL — ABNORMAL HIGH (ref 4.0–10.3)
lymph#: 1.4 10*3/uL (ref 0.9–3.3)

## 2015-04-23 MED ORDER — IOHEXOL 300 MG/ML  SOLN
100.0000 mL | Freq: Once | INTRAMUSCULAR | Status: AC | PRN
  Administered 2015-04-23: 100 mL via INTRAVENOUS

## 2015-04-25 ENCOUNTER — Ambulatory Visit (HOSPITAL_BASED_OUTPATIENT_CLINIC_OR_DEPARTMENT_OTHER): Payer: PPO | Admitting: Oncology

## 2015-04-25 ENCOUNTER — Telehealth: Payer: Self-pay | Admitting: Oncology

## 2015-04-25 ENCOUNTER — Other Ambulatory Visit: Payer: PPO

## 2015-04-25 VITALS — BP 130/70 | HR 79 | Temp 98.2°F | Resp 17 | Ht 72.0 in

## 2015-04-25 DIAGNOSIS — C786 Secondary malignant neoplasm of retroperitoneum and peritoneum: Secondary | ICD-10-CM

## 2015-04-25 DIAGNOSIS — C642 Malignant neoplasm of left kidney, except renal pelvis: Secondary | ICD-10-CM

## 2015-04-25 DIAGNOSIS — R109 Unspecified abdominal pain: Secondary | ICD-10-CM | POA: Diagnosis not present

## 2015-04-25 DIAGNOSIS — I10 Essential (primary) hypertension: Secondary | ICD-10-CM

## 2015-04-25 DIAGNOSIS — C649 Malignant neoplasm of unspecified kidney, except renal pelvis: Secondary | ICD-10-CM

## 2015-04-25 NOTE — Telephone Encounter (Signed)
Gave adn printed appt sched and avs for pt for Jan 2016 per pt request

## 2015-04-25 NOTE — Progress Notes (Signed)
Hematology and Oncology Follow Up Visit  Austin [name] [id number] [birth year] 59 y.o. 04/25/2015 9:46 AM Austin [name], MDBurchette, [name], MD   Principle Diagnosis: 59 year old gentleman with a renal cell carcinoma diagnosed in 2013 he presented with a 4.2 cm mass in the lower pole of the left kidney. Now he has stage IV disease.   Prior Therapy: He is status [name] robotic-assisted laparoscopic nephrectomy on the left done on 04/23/2012. The pathology revealed renal cell carcinoma with a grade 3/4 with the pathological staging of T3a.   Current therapy: Votrient 800 mg daily started around 12/27/2013. This was reduced to 400 mg subsequently and have been discontinued since 02/03/2014 due to poor tolerance.  Interim History: Austin [name] today for a followup visit. Since his last visit, he reports slight changes in his health. He reports intermittent abdominal and chest wall pain that requires Advil at times.. He has reported that lower extremity edema and takes diuretics as needed. He reports more fatigue and tiredness has not dramatically changed since the last visit. His appetite is reasonable. He is reporting slight distention and early satiety. Has not reported any flank pain or hematuria.   Has not reported chest pain or difficulty breathing. Has not reported any deterioration in his health status. 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 He has not reported any hospitalization or illnesses. He has not reported any arthralgias or myalgias. As that report any lymphadenopathy or petechiae. Is not reporting any bleeding complications. Rest of review of systems unremarkable.  Medications: I have reviewed the patient's current medications.  Current Outpatient Prescriptions  Medication Sig Dispense Refill  . atorvastatin (LIPITOR) 20 MG tablet Take 1 tablet (20 mg total) by mouth every morning. 90 tablet 2  .  clopidogrel (PLAVIX) 75 MG tablet Take 1 tablet (75 mg total) by mouth at bedtime. 90 tablet 2  . CRANBERRY PO Take 1 tablet by mouth 2 (two) times daily.    Marland Kitchen escitalopram (LEXAPRO) 10 MG tablet Take 1 tablet (10 mg total) by mouth every morning. 90 tablet 2  . ketoconazole (NIZORAL) 2 % cream APPLY  AS NEEDED TO RASH 60 g 2  . lisinopril-hydrochlorothiazide (PRINZIDE,ZESTORETIC) 20-12.5 MG per tablet Take 1 tablet by mouth 2 (two) times daily. 180 tablet 2  . metFORMIN (GLUCOPHAGE) 500 MG tablet Take 1 tablet (500 mg total) by mouth 2 (two) times daily with a meal. 180 tablet 2  . Multiple Vitamin (MULTIVITAMIN WITH MINERALS) TABS tablet Take 1 tablet by mouth daily.    Marland Kitchen sulfamethoxazole-trimethoprim (BACTRIM DS,SEPTRA DS) 800-160 MG per tablet Take 1 tablet by mouth 2 (two) times daily. 14 tablet 0  . testosterone enanthate (DELATESTRYL) 200 MG/ML injection Inject 1 mL (200 mg total) into the muscle every 21 ( twenty-one) days. For IM use only 15 mL 5  . traZODone (DESYREL) 100 MG tablet Take 1 tablet (100 mg total) by mouth at bedtime. 90 tablet 2  . triamcinolone cream (KENALOG) 0.1 % APPLYTO AFFECTED AREA TWICE DAILY AS NEEDED 15 g 2   No current facility-administered medications for this visit.     Allergies:  Allergies  Allergen Reactions  . Penicillins Rash    Past Medical History, Surgical history, Social history, and Family History were reviewed and updated.    Physical Exam: Blood pressure 130/70, pulse 79, temperature 98.2 F (36.8 C), temperature source Oral, resp. rate 17, height 6' (1.829 m), SpO2 98 %.  ECOG: 2 General appearance: alert cooperative gentleman in a wheelchair without any distress. Head: Normocephalic, without obvious abnormality.  Neck: no adenopathy Lymph nodes: Cervical, supraclavicular, and axillary nodes normal. Heart:regular rate and rhythm, S1, S2 normal, no murmur, click, rub or gallop Lung:chest clear, no wheezing, rales, no dullness to  percussion the Abdomin: soft, non-tender, without masses or organomegaly EXT: Bilateral lower showed edema noted.    Lab Results: Lab Results  Component Value Date   WBC 12.7* 04/23/2015   HGB 11.3* 04/23/2015   HCT 34.8* 04/23/2015   MCV 89.2 04/23/2015   PLT 248 04/23/2015     Chemistry      Component Value Date/Time   NA 137 04/23/2015 0959   NA 137 10/17/2013 1706   K 4.9 04/23/2015 0959   K 4.0 10/17/2013 1706   CL 102 10/17/2013 1706   CO2 22 04/23/2015 0959   CO2 24 10/17/2013 1706   BUN 30.6* 04/23/2015 0959   BUN 17 10/17/2013 1706   CREATININE 1.5* 04/23/2015 0959   CREATININE 1.1 10/17/2013 1706      Component Value Date/Time   CALCIUM 10.0 04/23/2015 0959   CALCIUM 9.6 10/17/2013 1706   ALKPHOS 103 04/23/2015 0959   ALKPHOS 78 10/17/2013 1706   AST 13 04/23/2015 0959   AST 18 10/17/2013 1706   ALT 20 04/23/2015 0959   ALT 25 10/17/2013 1706   BILITOT 0.24 04/23/2015 0959   BILITOT 0.5 10/17/2013 1706     EXAM: CT CHEST WITH CONTRAST  CT ABDOMEN AND PELVIS WITH AND WITHOUT CONTRAST  TECHNIQUE: Multidetector CT imaging of the chest was performed during intravenous contrast administration. Multidetector CT imaging of the abdomen and pelvis was performed following the standard protocol before and during bolus administration of intravenous contrast.  CONTRAST: 170m OMNIPAQUE IOHEXOL 300 MG/ML SOLN  COMPARISON: 08/23/2014  FINDINGS: CT CHEST FINDINGS  Mediastinum/Nodes: No supraclavicular adenopathy. Mild bilateral gynecomastia. Aortic and branch vessel atherosclerosis. Normal heart size, without pericardial effusion. Multivessel coronary artery atherosclerosis. Right coronary and left anterior descending atherosclerosis. No central pulmonary embolism, on this non-dedicated study. No mediastinal or hilar adenopathy.  Lungs/Pleura: No pleural fluid. Mild degradation secondary to motion and patient arm position.  Mild centrilobular  emphysema. Possible 6 mm left lower lobe pulmonary nodule on image 78 of series 6. This is at the site of motion degradation. Also image 17 of series 3. This is likely new or enlarged since the prior exam.  Musculoskeletal: No acute osseous abnormality.  CT ABDOMEN PELVIS FINDINGS  Hepatobiliary: Motion degradation continuing into the abdomen. Normal liver. Cholecystectomy, without biliary ductal dilatation.  Pancreas: Normal, without mass or ductal dilatation.  Spleen: Anterior splenic lesion measures 4.0 cm on image 93 versus 3.2 cm on the prior.  Adrenals/Urinary Tract: Minimal right adrenal nodularity likely similar. Left adrenal nodularity which measures 1.4 cm on image 109 of series 4. This is similar to the prior exam (when remeasured). More inferiorly, nodularity within the left adrenal measures 1.3 cm on image 1 and 15. This may be slightly increased.  Status [name] left nephrectomy, with fat necrosis in the operative bed. No locally recurrent disease identified. An exophytic lower pole right renal lesion posteriorly is too small to characterize at 8 mm, including on image 132 of series 4. Favored to represent a hemorrhagic cyst. There is also a too more central right lower lobe too small to characterize lesion. Normal right ureter and urinary bladder.  Stomach/Bowel: Normal stomach, without wall thickening. Normal colon, appendix, and  terminal ileum. Normal small bowel caliber. Small bowel is again positioned within an area of ventral abdominal pelvic wall laxity.  Vascular/Lymphatic: Aortic and branch vessel atherosclerosis. No abdominopelvic adenopathy.  Reproductive: Normal prostate. No significant free fluid.  Other: Heterogeneous left paracentral omental mass measures 4.4 x 4.8 cm on image 139. Compare 4.1 x 3.6 cm on the prior exam.  A nodule along the right paracolic gutter measures 4.3 x 3.3 cm on image 150. Compare 3.7 x 2.9 cm on the  prior.  Musculoskeletal: Mild osteopenia. Scattered bone islands.  IMPRESSION: 1. Mild progression of peritoneal metastasis. 2. Enlargement of a splenic lesion, also suspicious for progressive metastasis. 3. Left nephrectomy without locally recurrent disease. 4. Left adrenal nodularity similar to minimally increased. Recommend attention on follow-up. 5. Motion degraded AP exam. Apparent nodularity at the left lung base could be due to volume averaging of atelectasis. Otherwise, no metastatic disease identified within the chest. 6. Emphysema and coronary artery atherosclerosis. 7. Gynecomastia.      Impression and Plan:  59 year old gentleman with the following issues:  1. Renal cell carcinoma diagnosed with a left renal mass in July of 2013 and after a nephrectomy developed peritoneal and retroperitoneal metastasis. He was started on Votrient in May of 2015 but was discontinued due to poor tolerance.   CT scan on 04/23/2015 was discussed today and predictably showed progressive disease predominantly peritoneum and omentum. His disease has not dramatically changed and we see no wide spread of his cancer at this time.  Patient have refused further systemic therapy and currently on supportive care only. He seems to be rather stable at this time without any major changes in his health status. He declined hospice at this time as his condition have been stable.   2. Fatigue and functional decline: Likely related to his malignancy. Not dramatically changed at this time.  3. Abdominal pain: Could be related to his malignancy . I offered him a stronger pain medication and he declined. He feels that his pain is fairly manageable with Tylenol and Advil.  4. Hypertension: Blood pressure control at this time.  5. Followup: 3 months or sooner if needed to.   [facility], MD 9/14/20169:46 AM

## 2015-05-11 ENCOUNTER — Telehealth: Payer: Self-pay | Admitting: Family Medicine

## 2015-05-11 NOTE — Telephone Encounter (Signed)
[place] per [place] for both to get the flu shot on 10.12.2016

## 2015-05-11 NOTE — Telephone Encounter (Signed)
Pt's wife is calling in today asking if she can get her flu shot during her husbands appt on 10/12 at 9:30. She states that Dr. [name] advised that they both can get this done together during his appointment instead of coming back in. Please advise. Thanks

## 2015-05-21 ENCOUNTER — Ambulatory Visit: Payer: PPO | Admitting: Family Medicine

## 2015-05-23 ENCOUNTER — Ambulatory Visit (INDEPENDENT_AMBULATORY_CARE_PROVIDER_SITE_OTHER): Payer: PPO | Admitting: Family Medicine

## 2015-05-23 ENCOUNTER — Encounter: Payer: Self-pay | Admitting: Family Medicine

## 2015-05-23 VITALS — BP 100/70 | HR 78 | Temp 97.9°F

## 2015-05-23 DIAGNOSIS — R5382 Chronic fatigue, unspecified: Secondary | ICD-10-CM

## 2015-05-23 DIAGNOSIS — E119 Type 2 diabetes mellitus without complications: Secondary | ICD-10-CM

## 2015-05-23 DIAGNOSIS — Z23 Encounter for immunization: Secondary | ICD-10-CM | POA: Diagnosis not present

## 2015-05-23 DIAGNOSIS — R7989 Other specified abnormal findings of blood chemistry: Secondary | ICD-10-CM

## 2015-05-23 DIAGNOSIS — I10 Essential (primary) hypertension: Secondary | ICD-10-CM

## 2015-05-23 DIAGNOSIS — D497 Neoplasm of unspecified behavior of endocrine glands and other parts of nervous system: Secondary | ICD-10-CM | POA: Diagnosis not present

## 2015-05-23 DIAGNOSIS — E291 Testicular hypofunction: Secondary | ICD-10-CM

## 2015-05-23 DIAGNOSIS — R5383 Other fatigue: Secondary | ICD-10-CM | POA: Insufficient documentation

## 2015-05-23 LAB — TSH: TSH: 3.4 u[IU]/mL (ref 0.35–4.50)

## 2015-05-23 LAB — HEMOGLOBIN A1C: HEMOGLOBIN A1C: 6.3 % (ref 4.6–6.5)

## 2015-05-23 LAB — TESTOSTERONE: Testosterone: 125.38 ng/dL — ABNORMAL LOW (ref 300.00–890.00)

## 2015-05-23 NOTE — Progress Notes (Signed)
Pre visit review using our clinic review tool, if applicable. No additional management support is needed unless otherwise documented below in the visit note. 

## 2015-05-23 NOTE — Progress Notes (Signed)
Subjective:    Patient ID: Austin [name], male    DOB: May 07, 1956, 59 y.o.   MRN: [id number]  HPI Medical follow-up. Metastatic renal cell cancer, type 2 diabetes, hyperlipidemia, history of pituitary tumor, history of brain stem stroke. Followed regularly by oncology. He had recent CT abdomen pelvis and chest with mild progression of disease. He has decided against any further treatment for his metastatic renal cell carcinoma  Type 2 diabetes. Not monitoring regularly. No recent hypoglycemia symptoms. Diabetes is generally been well controlled. He complains of excessive fatigue. Appetite generally is very poor. He remains on testosterone replacement. Previous testosterone levels of been good.  Denies any fevers or chills. He has neurogenic bladder and gets in and out caths  Past Medical History  Diagnosis Date  . Hx of colonic polyps   . Hypertension   . Pituitary adenoma ([place])   . Urinary incontinence   . Venous stasis     edema  . Pituitary macroadenoma ([place])     progression into right cavernous sinus  . Hypogonadism   . Neuromuscular disorder ([place])     quadraplegic  . Chronic kidney disease     Left Renal Mass  . Sleep apnea     stopbang=4  . Cerebrovascular accident ([place]) 1994    hx of brainstem stroke, residual diminished lung capacity  . 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 Diabetes mellitus without complication [facility])    Past Surgical History  Procedure Laterality Date  . Tracheostomy tube placement  02/1993    removed 04/1993  . Stomach peg  02/1993    removed 5-6 years later  . Cholecystectomy    . Pituitary surgery  2007, 2011  . Gamma knife radiation surgery  05/2010    for pituitary  . Colonoscopy  02/27/2012    Procedure: COLONOSCOPY;  Surgeon: Jerene Bears, MD;  Location: [facility];  Service: Gastroenterology;  Laterality: N/A;  . Vocal cord surgery      injected with collagen, then fat from stomach to  improve speech s/p stroke  . Vasectomy  1984  . Robotic partial nephrectomy  04-23-12    left   Left Renal Mass  . Robot assisted laparoscopic nephrectomy  04/23/2012    Procedure: ROBOTIC ASSISTED LAPAROSCOPIC NEPHRECTOMY;  Surgeon: Alexis Frock, MD;  Location: [facility];  Service: Urology;  Laterality: Left;  radical  . Umbilical hernia repair  04/23/2012    Procedure: HERNIA REPAIR UMBILICAL ADULT;  Surgeon: Alexis Frock, MD;  Location: [facility];  Service: Urology;;    reports that he quit smoking about 22 years ago. His smoking use included Cigarettes. He has a 11.5 pack-year smoking history. He has never used smokeless tobacco. He reports that he does not drink alcohol or use illicit drugs. family history includes Alcohol abuse in his father and father; Arthritis in his maternal grandmother and mother; Colon cancer in his brother; Esophageal cancer (age of onset: 61) in his father; Hyperlipidemia in his mother; Hypertension in his brother; Stroke in his paternal grandmother; Throat cancer in his father; Uterine cancer (age of onset: 53) in his mother. Allergies  Allergen Reactions  . Penicillins Rash       Review of Systems  Constitutional: Positive for appetite change and fatigue.  Eyes: Negative for visual disturbance.  Respiratory: Negative for cough, chest tightness and shortness of breath.   Cardiovascular: Negative for chest pain, palpitations and leg swelling.  Gastrointestinal: Negative  for abdominal pain.  Neurological: Negative for dizziness, syncope, light-headedness and headaches.       Objective:   Physical Exam  Constitutional: He appears well-developed and well-nourished.  Neck: Neck supple. No JVD present.  Cardiovascular: Normal rate and regular rhythm.   Pulmonary/Chest: Effort normal and breath sounds normal. No respiratory distress. He has no wheezes. He has no rales.  Musculoskeletal: He exhibits no edema.  Neurological: He is alert.  Psychiatric: He has a  normal mood and affect. His behavior is normal.          Assessment & Plan:  #1 type 2 diabetes. History of good control. Recheck A1c #2 hyperlipidemia. Lipids were checked last April and stable. Continue Lipitor #3 fatigue. Likely multifactorial. Difficult to sort out how much this is related to his metastatic renal cancer. Check TSH with history of pituitary tumor #4 low testosterone. Recheck testosterone level #5 metastatic renal cell carcinoma with recent scans as above. Followed closely by oncology #6 health maintenance. Flu vaccine given

## 2015-06-05 ENCOUNTER — Telehealth: Payer: Self-pay | Admitting: Family Medicine

## 2015-06-05 MED ORDER — TESTOSTERONE ENANTHATE 200 MG/ML IM SOLN: 200.0000 mg | INTRAMUSCULAR | Status: DC

## 2015-06-05 NOTE — Telephone Encounter (Signed)
I informed the pts wife of the message below.  She stated he needs a refill on this as she thinks she only has enough for one more injection. I called in a refill to the [facility].

## 2015-06-05 NOTE — Telephone Encounter (Signed)
Because of his fatigue and very low levels, I would increase this to every 2 weeks.

## 2015-06-05 NOTE — Telephone Encounter (Signed)
Pt wife call to follow up. She wants to know if pt should be taking his testosterone injections every 2 weeks or if he should stay at every 3 weeks since he testosteron.

## 2015-06-06 ENCOUNTER — Telehealth: Payer: Self-pay | Admitting: Family Medicine

## 2015-06-06 NOTE — Telephone Encounter (Signed)
Error

## 2015-06-12 ENCOUNTER — Telehealth: Payer: Self-pay | Admitting: Family Medicine

## 2015-06-12 DIAGNOSIS — N390 Urinary tract infection, site not specified: Secondary | ICD-10-CM

## 2015-06-12 NOTE — Telephone Encounter (Addendum)
Pt has a history of uti and wife would like to bring specimen in. Can I sch lab only. Pt wife is aware md out of office this wife. Pt is confine to wheelchair

## 2015-06-12 NOTE — Telephone Encounter (Signed)
Pt wife will bring sample in tomorrow morning

## 2015-06-12 NOTE — Telephone Encounter (Signed)
Yes, that will be fine, per Dr. Sarajane Jews.

## 2015-06-13 ENCOUNTER — Other Ambulatory Visit: Payer: Self-pay | Admitting: Family Medicine

## 2015-06-13 ENCOUNTER — Other Ambulatory Visit (INDEPENDENT_AMBULATORY_CARE_PROVIDER_SITE_OTHER): Payer: PPO

## 2015-06-13 DIAGNOSIS — R8290 Unspecified abnormal findings in urine: Secondary | ICD-10-CM | POA: Diagnosis not present

## 2015-06-13 DIAGNOSIS — N390 Urinary tract infection, site not specified: Secondary | ICD-10-CM

## 2015-06-13 LAB — POCT URINALYSIS DIPSTICK
Bilirubin, UA: NEGATIVE
Glucose, UA: NEGATIVE
KETONES UA: NEGATIVE
Nitrite, UA: POSITIVE
PH UA: 5.5
PROTEIN UA: NEGATIVE
SPEC GRAV UA: 1.01
UROBILINOGEN UA: 0.2

## 2015-06-13 MED ORDER — CIPROFLOXACIN HCL 500 MG PO TABS: 500.0000 mg | ORAL_TABLET | Freq: Two times a day (BID) | ORAL | Status: DC

## 2015-06-13 NOTE — Addendum Note (Signed)
Addended by: Alysia Penna A on: 06/13/2015 10:34 AM   Modules accepted: Orders

## 2015-06-13 NOTE — Telephone Encounter (Signed)
Please advise for urine results?

## 2015-06-13 NOTE — Telephone Encounter (Signed)
Pt's wife is aware that RX has been sent into Pharmacy.

## 2015-06-13 NOTE — Telephone Encounter (Signed)
Call in Cipro 500 mg bid for 10 days

## 2015-06-16 LAB — URINE CULTURE

## 2015-07-07 ENCOUNTER — Encounter: Payer: Self-pay | Admitting: Family

## 2015-07-07 ENCOUNTER — Ambulatory Visit (INDEPENDENT_AMBULATORY_CARE_PROVIDER_SITE_OTHER): Payer: PPO | Admitting: Family

## 2015-07-07 VITALS — BP 122/68 | HR 111 | Temp 98.8°F | Ht 72.0 in

## 2015-07-07 DIAGNOSIS — B029 Zoster without complications: Secondary | ICD-10-CM

## 2015-07-07 DIAGNOSIS — J45909 Unspecified asthma, uncomplicated: Secondary | ICD-10-CM

## 2015-07-07 MED ORDER — VALACYCLOVIR HCL 1 G PO TABS: 1000.0000 mg | ORAL_TABLET | Freq: Three times a day (TID) | ORAL | Status: DC

## 2015-07-07 MED ORDER — ALBUTEROL SULFATE HFA 108 (90 BASE) MCG/ACT IN AERS: 2.0000 | INHALATION_SPRAY | Freq: Four times a day (QID) | RESPIRATORY_TRACT | Status: DC | PRN

## 2015-07-07 NOTE — Progress Notes (Signed)
Pre visit review using our clinic review tool, if applicable. No additional management support is needed unless otherwise documented below in the visit note. 

## 2015-07-07 NOTE — Patient Instructions (Signed)
Start albuterol 2 puffs every 6 hours as needed.   Start Valtrex for shingles. Call if symptoms worsen, or if not improved in 3 days.    Shingles Shingles, which is also known as herpes zoster, is an infection that causes a painful skin rash and fluid-filled blisters. Shingles is not related to genital herpes, which is a sexually transmitted infection.   Shingles only develops in people who:  Have had chickenpox.  Have received the chickenpox vaccine. (This is rare.) CAUSES Shingles is caused by varicella-zoster virus (VZV). This is the same virus that causes chickenpox. After exposure to VZV, the virus stays in the body in an inactive (dormant) state. Shingles develops if the virus reactivates. This can happen many years after the initial exposure to VZV. It is not known what causes this virus to reactivate. RISK FACTORS People who have had chickenpox or received the chickenpox vaccine are at risk for shingles. Infection is more common in people who:  Are older than age 82.  Have a weakened defense (immune) system, such as those with HIV, AIDS, or cancer.  Are taking medicines that weaken the immune system, such as transplant medicines.  Are under great stress. SYMPTOMS Early symptoms of this condition include itching, tingling, and pain in an area on your skin. Pain may be described as burning, stabbing, or throbbing. A few days or weeks after symptoms start, a painful red rash appears, usually on one side of the body in a bandlike or beltlike pattern. The rash eventually turns into fluid-filled blisters that break open, scab over, and dry up in about 2-3 weeks. At any time during the infection, you may also develop:  A fever.  Chills.  A headache.  An upset stomach. DIAGNOSIS This condition is diagnosed with a skin exam. Sometimes, skin or fluid samples are taken from the blisters before a diagnosis is made. These samples are examined under a microscope or sent to a lab for  testing. TREATMENT There is no specific cure for this condition. Your health care provider will probably prescribe medicines to help you manage pain, recover more quickly, and avoid long-term problems. Medicines may include:  Antiviral drugs.  Anti-inflammatory drugs.  Pain medicines. If the area involved is on your face, you may be referred to a specialist, such as an eye doctor (ophthalmologist) or an ear, nose, and throat (ENT) doctor to help you avoid eye problems, chronic pain, or disability. HOME CARE INSTRUCTIONS Medicines  Take medicines only as directed by your health care provider.  Apply an anti-itch or numbing cream to the affected area as directed by your health care provider. Blister and Rash Care  Take a cool bath or apply cool compresses to the area of the rash or blisters as directed by your health care provider. This may help with pain and itching.  Keep your rash covered with a loose bandage (dressing). Wear loose-fitting clothing to help ease the pain of material rubbing against the rash.  Keep your rash and blisters clean with mild soap and cool water or as directed by your health care provider.  Check your rash every day for signs of infection. These include redness, swelling, and pain that lasts or increases.  Do not pick your blisters.  Do not scratch your rash. General Instructions  Rest as directed by your health care provider.  Keep all follow-up visits as directed by your health care provider. This is important.  Until your blisters scab over, your infection can cause chickenpox in  people who have never had it or been vaccinated against it. To prevent this from happening, avoid contact with other people, especially:  Babies.  Pregnant women.  Children who have eczema.  Elderly people who have transplants.  People who have chronic illnesses, such as leukemia or AIDS. SEEK MEDICAL CARE IF:  Your pain is not relieved with prescribed  medicines.  Your pain does not get better after the rash heals.  Your rash looks infected. Signs of infection include redness, swelling, and pain that lasts or increases. SEEK IMMEDIATE MEDICAL CARE IF:  The rash is on your face or nose.  You have facial pain, pain around your eye area, or loss of feeling on one side of your face.  You have ear pain or you have ringing in your ear.  You have loss of taste.  Your condition gets worse.   This information is not intended to replace advice given to you by your health care provider. Make sure you discuss any questions you have with your health care provider.   Document Released: 07/28/2005 Document Revised: 08/18/2014 Document Reviewed: 06/08/2014 Elsevier Interactive Patient Education [employer].

## 2015-07-07 NOTE — Progress Notes (Signed)
 Subjective:    Patient ID: Austin [name], male    DOB: 12/01/1955, 59 y.o.   MRN: [id number]  HPI   Austin [name] is a 59 yr old male with prior hx of CVA, and stage IV renal cell carcinoma (not currently on chemotherapy due to intolerance) who presents today with chief complaint of blisters on the right side of his face.  Wife first noticed that he had a blister on his right cheek above his beard last night. This morning, he developed blisters in front of his right ear. Blisters are painful.  He reports that this week he has also had some cold symptoms this week.  Last night developed rash.  + aching and chills.  Also reports that he has had some mild chest congestion/shortness of breath.   Review of Systems See HPI  Past Medical History  Diagnosis Date  . Hx of colonic polyps   . Hypertension   . Pituitary adenoma (HCC)   . Urinary incontinence   . Venous stasis     edema  . 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 Hypogonadism   . Neuromuscular disorder (HCC)     quadraplegic  . Chronic kidney disease     Left Renal Mass  . Sleep apnea     stopbang=4  . Cerebrovascular accident (HCC) 1994    hx of brainstem stroke, residual diminished lung capacity  . 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 Diabetes mellitus without complication (HCC)     Social History   Social History  . Marital Status: Married    Spouse Name: N/A  . Number of Children: 3  . Years of Education: N/A   Occupational History  . disabled    Social History Main Topics  . Smoking status: Former Smoker -- 0.50 packs/day for 23 years    Types: Cigarettes    Quit date: 09/24/1992  . Smokeless tobacco: Never Used  . Alcohol Use: No     Comment: rarley  . Drug Use: No  . Sexual Activity: Not on file   Other Topics Concern  . Not on file   Social History Narrative   Retired, disabled 1994 Quadreplegic    Past Surgical  History  Procedure Laterality Date  . Tracheostomy tube placement  02/1993    removed 04/1993  . Stomach peg  02/1993    removed 5-6 years later  . Cholecystectomy    . Pituitary surgery  2007, 2011  . Gamma knife radiation surgery  05/2010    for pituitary  . Colonoscopy  02/27/2012    Procedure: COLONOSCOPY;  Surgeon: Jay M Pyrtle, MD;  Location: [facility];  Service: Gastroenterology;  Laterality: N/A;  . Vocal cord surgery      injected with collagen, then fat from stomach to improve speech s/p stroke  . Vasectomy  1984  . Robotic partial nephrectomy  04-23-12    left   Left Renal Mass  . Robot assisted laparoscopic nephrectomy  04/23/2012    Procedure: ROBOTIC ASSISTED LAPAROSCOPIC NEPHRECTOMY;  Surgeon: Theodore Manny, MD;  Location: [facility];  Service: Urology;  Laterality: Left;  radical  . Umbilical hernia repair  04/23/2012    Procedure: HERNIA REPAIR UMBILICAL ADULT;  Surgeon: Theodore Manny, MD;  Location: [facility];  Service: Urology;;    Family History  Problem Relation Age of Onset  . Alcohol abuse Father   .   Arthritis Mother   . Colon cancer Brother   . Arthritis Maternal Grandmother   . Hyperlipidemia Mother   . Hypertension Brother   . Stroke Paternal Grandmother   . Throat cancer Father   . Esophageal cancer Father 29  . Alcohol abuse Father   . Uterine cancer Mother 67    Allergies  Allergen Reactions  . Penicillins Rash    Current Outpatient Prescriptions on File Prior to Visit  Medication Sig Dispense Refill  . atorvastatin (LIPITOR) 20 MG tablet Take 1 tablet (20 mg total) by mouth every morning. 90 tablet 2  . clopidogrel (PLAVIX) 75 MG tablet Take 1 tablet (75 mg total) by mouth at bedtime. 90 tablet 2  . CRANBERRY PO Take 1 tablet by mouth 2 (two) times daily.    Marland Kitchen escitalopram (LEXAPRO) 10 MG tablet Take 1 tablet (10 mg total) by mouth every morning. 90 tablet 2  . ketoconazole (NIZORAL) 2 % cream APPLY  AS NEEDED TO RASH 60 g 2  .  lisinopril-hydrochlorothiazide (PRINZIDE,ZESTORETIC) 20-12.5 MG per tablet Take 1 tablet by mouth 2 (two) times daily. 180 tablet 2  . metFORMIN (GLUCOPHAGE) 500 MG tablet Take 1 tablet (500 mg total) by mouth 2 (two) times daily with a meal. 180 tablet 2  . Multiple Vitamin (MULTIVITAMIN WITH MINERALS) TABS tablet Take 1 tablet by mouth daily.    Marland Kitchen testosterone enanthate (DELATESTRYL) 200 MG/ML injection Inject 1 mL (200 mg total) into the muscle every 14 (fourteen) days. For IM use only 15 mL 1  . traZODone (DESYREL) 100 MG tablet Take 1 tablet (100 mg total) by mouth at bedtime. 90 tablet 2  . triamcinolone cream (KENALOG) 0.1 % APPLYTO AFFECTED AREA TWICE DAILY AS NEEDED 15 g 2   No current facility-administered medications on file prior to visit.    BP 122/68 mmHg  Pulse 111  Temp(Src) 98.8 F (37.1 C) (Oral)  Ht 6' (1.829 m)  Wt   SpO2 96%       Objective:   Physical Exam  Constitutional: He is oriented to person, place, and time. He appears well-developed and well-nourished. No distress.  HENT:  Head: Normocephalic and atraumatic.  Cardiovascular: Normal rate and regular rhythm.   No murmur heard. Pulmonary/Chest: Effort normal. No respiratory distress. He has no rales.  Soft expiratory wheeze is noted.   Musculoskeletal: He exhibits no edema.  Neurological: He is alert and oriented to person, place, and time.  Skin:  Blisters noted anterior to right ear on cheek. Some blisters noted along top of beard line on right extending to the right lip.   Psychiatric: He has a normal mood and affect. His behavior is normal. Thought content normal.          Assessment & Plan:  Herpes Zoster-  Will rx with valtrex.   Discussed with patient, wife and pt's mother the progression of herpes zoster.  He feels that his pain is well controlled currently, they have been alternating tylenol with motrin.  I have advised them to avoid motrin due to pt's hx of renal insufficiency/solitary  kidney.  Asthma- mild wheezing today.  No sign of bacterial infection. Rx provided for albuterol MDI (I called in rx for a spacer). I have advised them to call if increased sob, fever, cough or if no improvement with albuterol. They verbalize understanding.   I have advised them to arrange follow up with Dr. [name] in 1 week.

## 2015-07-09 ENCOUNTER — Telehealth: Payer: Self-pay | Admitting: *Deleted

## 2015-07-09 ENCOUNTER — Telehealth: Payer: Self-pay | Admitting: Family Medicine

## 2015-07-09 NOTE — Telephone Encounter (Signed)
Patient's wife said the patient was prescribed an albuterol inhaler but the spacer for the inhaler costs $45 and they didn't pick that up from the pharmacy because of the cost. They were wondering if we had any samples.

## 2015-07-09 NOTE — Telephone Encounter (Signed)
[facility] seen at [facility] -------------------------------------------------------------------------------------------------------------------------------------------------------------------------------------------- PLEASE NOTE: All timestamps contained within this report are represented as [place] Standard Time. CONFIDENTIALTY NOTICE: This fax transmission is intended only for the addressee. It contains information that is legally privileged, confidential or otherwise protected from use or disclosure. If you are not the intended recipient, you are strictly prohibited from reviewing, disclosing, copying using or disseminating any of this information or taking any action in reliance on or regarding this information. If you have received this fax in error, please notify us immediately by telephone so that we can arrange for its return to [place]. Phone: [phone], Toll-Free: [phone], Fax: [phone] Page: 1 of 2 Call Id: [id number] [facility] - Client [place] Patient Name: Austin [name] Gender: Male DOB: 10-26-1955 Age: 59 Y 9 M 12 D Return Phone Number: [phone] (Primary) Address: City/State/Zip: [place] Client [facility] - Client Client Site [facility] Night Physician Carolann Littler Contact Type Call Call Type Triage / Clinical Caller Name Peter Congo Relationship To Patient Spouse Return Phone Number [phone] (Primary) Chief Complaint BREATHING - fast, heavy or wheezing Initial Comment Caller States her husband is coughing and having difficulty breathing, has blisters on the right side of his face and mouth, temp of 99.8 PreDisposition InappropriateToAsk Nurse Assessment Nurse: Wayne Sever, RN, Tillie Rung Date/Time (Eastern Time): 07/07/2015 8:45:38 AM Confirm and document reason for call. If symptomatic, describe symptoms. ---Wife states she is having  blisters on the right side of his face. He is achy per wife. Fever is 99.8 (ear). He is quadriplegic. Has the patient traveled out of the country within the last 30 days? ---Not Applicable Does the patient have any new or worsening symptoms? ---Yes Will a triage be completed? ---Yes Related visit to physician within the last 2 weeks? ---No Does the PT have any chronic conditions? (i.e. diabetes, asthma, etc.) ---Yes List chronic conditions. ---Kidney Cancer(not on chemo), Quadriplegic, Brain Stem Stroke at 34 Is this a behavioral health call? ---No Guidelines Guideline Title Affirmed Question Affirmed Notes Nurse Date/Time (Eastern Time) Shingles [1] Shingles rash (matches SYMPTOMS) AND [2] weak immune system (e.g., HIV positive, cancer chemotherapy, chronic steroid treatment, splenectomy) AND [3] NOT taking antiviral medication Billee Cashing 07/07/2015 8:47:40 AM PLEASE NOTE: All timestamps contained within this report are represented as [place] Standard Time. CONFIDENTIALTY NOTICE: This fax transmission is intended only for the addressee. It contains information that is legally privileged, confidential or otherwise protected from use or disclosure. If you are not the intended recipient, you are strictly prohibited from reviewing, disclosing, copying using or disseminating any of this information or taking any action in reliance on or regarding this information. If you have received this fax in error, please notify us immediately by telephone so that we can arrange for its return to [place]. Phone: [phone], Toll-Free: [phone], Fax: [phone] Page: 2 of 2 Call Id: [id number] [place]. Time Eilene Ghazi Time) Disposition Final User 07/07/2015 8:42:56 AM Send To Call Back Waiting For Nurse Antonietta Barcelona 07/07/2015 8:43:07 AM Send to Urgent Queue Antonietta Barcelona 07/07/2015 8:48:48 AM See Physician within 4 Hours (or PCP triage) Yes Wayne Sever, RN, Mable Paris Understands:  Yes Disagree/Comply: Comply Care Advice Given Per Guideline SEE PHYSICIAN WITHIN 4 HOURS (or PCP triage): * IF OFFICE WILL BE OPEN: You need to be seen within the next 3 or 4 hours. Call your doctor's office now or as soon as it opens. CALL BACK IF: * You become  worse. CARE ADVICE given per Shingles (Adult) guideline. After Care Instructions Given Call Event Type User Date / Time Description Referrals [facility]

## 2015-07-10 NOTE — Telephone Encounter (Signed)
Yes-if we have any samples.  I do not currently.  I do have a coupon for a free trial.

## 2015-07-10 NOTE — Telephone Encounter (Signed)
Generally, places like [employer] and [employer] have been very competitive for cost.

## 2015-07-10 NOTE — Telephone Encounter (Signed)
Pt is aware of annotations.  

## 2015-07-10 NOTE — Telephone Encounter (Signed)
Spoke with patients wife and they bought the inhaler. They want to know where they can get a spacer for less than 45 dollars. Any advice?

## 2015-07-10 NOTE — Telephone Encounter (Signed)
Okay to provide pt with sample of Proair Respiclick?

## 2015-07-16 ENCOUNTER — Encounter: Payer: Self-pay | Admitting: Family Medicine

## 2015-07-16 ENCOUNTER — Ambulatory Visit (INDEPENDENT_AMBULATORY_CARE_PROVIDER_SITE_OTHER): Payer: PPO | Admitting: Family Medicine

## 2015-07-16 VITALS — BP 120/90 | HR 80 | Temp 98.2°F | Resp 16

## 2015-07-16 DIAGNOSIS — B029 Zoster without complications: Secondary | ICD-10-CM | POA: Diagnosis not present

## 2015-07-16 NOTE — Patient Instructions (Signed)

## 2015-07-16 NOTE — Progress Notes (Signed)
Subjective:    Patient ID: Austin [name], male    DOB: 11-05-1955, 59 y.o.   MRN: [id number]  HPI Follow-up regarding shingles. He was seen [facility] with involvement right side of face. His pain was initially 6 out of 10 and currently 2-3 out of 10. He was treated with Valtrex. Lesions are healing nicely. He's not had any fevers or chills. No headaches. No hearing loss to suggest Ramsay Hunt syndrome  He has metastatic renal cell carcinoma and is elected not have any further treatment secondary to previous side effects.  Past Medical History  Diagnosis Date  . Hx of colonic polyps   . Hypertension   . Pituitary adenoma ([place])   . Urinary incontinence   . Venous stasis     edema  . Pituitary macroadenoma ([place])     progression into right cavernous sinus  . Hypogonadism   . Neuromuscular disorder ([place])     quadraplegic  . Chronic kidney disease     Left Renal Mass  . Sleep apnea     stopbang=4  . Cerebrovascular accident ([place]) 1994    hx of brainstem stroke, residual diminished lung capacity  . 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 Diabetes mellitus without complication [facility])    Past Surgical History  Procedure Laterality Date  . Tracheostomy tube placement  02/1993    removed 04/1993  . Stomach peg  02/1993    removed 5-6 years later  . Cholecystectomy    . Pituitary surgery  2007, 2011  . Gamma knife radiation surgery  05/2010    for pituitary  . Colonoscopy  02/27/2012    Procedure: COLONOSCOPY;  Surgeon: Jerene Bears, MD;  Location[facility];  Service: Gastroenterology;  Laterality: N/A;  . Vocal cord surgery      injected with collagen, then fat from stomach to improve speech s/p stroke  . Vasectomy  1984  . Robotic partial nephrectomy  04-23-12    left   Left Renal Mass  . Robot assisted laparoscopic nephrectomy  04/23/2012    Procedure: ROBOTIC ASSISTED LAPAROSCOPIC NEPHRECTOMY;  Surgeon:  Alexis Frock, MD;  Location: [facility];  Service: Urology;  Laterality: Left;  radical  . Umbilical hernia repair  04/23/2012    Procedure: HERNIA REPAIR UMBILICAL ADULT;  Surgeon: Alexis Frock, MD;  Location: [facility];  Service: Urology;;    reports that he quit smoking about 22 years ago. His smoking use included Cigarettes. He has a 11.5 pack-year smoking history. He has never used smokeless tobacco. He reports that he does not drink alcohol or use illicit drugs. family history includes Alcohol abuse in his father and father; Arthritis in his maternal grandmother and mother; Colon cancer in his brother; Esophageal cancer (age of onset: 58) in his father; Hyperlipidemia in his mother; Hypertension in his brother; Stroke in his paternal grandmother; Throat cancer in his father; Uterine cancer (age of onset: 73) in his mother. Allergies  Allergen Reactions  . Penicillins Rash      Review of Systems  Constitutional: Negative for fever and chills.  Neurological: Negative for headaches.       Objective:   Physical Exam  Constitutional: He is oriented to person, place, and time. He appears well-developed and well-nourished.  Cardiovascular: Normal rate and regular rhythm.   Pulmonary/Chest: Effort normal and breath sounds normal. No respiratory distress. He has no wheezes. He has no rales.  Neurological:  He is alert and oriented to person, place, and time. No cranial nerve deficit.  Skin: Rash noted.  Couple of escars right side of face and right external ear. Lesions appear to be healing well. He does not have any involvement of right external canal other than along the pinna          Assessment & Plan:  Recent shingles right side of face. Minimal pain. Healing well following Valtrex. No further intervention at this point.

## 2015-07-16 NOTE — Progress Notes (Signed)
Pre visit review using our clinic review tool, if applicable. No additional management support is needed unless otherwise documented below in the visit note. 

## 2015-07-24 ENCOUNTER — Other Ambulatory Visit: Payer: Self-pay | Admitting: Family Medicine

## 2015-08-21 ENCOUNTER — Other Ambulatory Visit: Payer: Self-pay | Admitting: Family Medicine

## 2015-08-21 ENCOUNTER — Other Ambulatory Visit: Payer: Self-pay | Admitting: *Deleted

## 2015-08-21 DIAGNOSIS — C649 Malignant neoplasm of unspecified kidney, except renal pelvis: Secondary | ICD-10-CM

## 2015-08-22 ENCOUNTER — Other Ambulatory Visit (HOSPITAL_BASED_OUTPATIENT_CLINIC_OR_DEPARTMENT_OTHER): Payer: PPO

## 2015-08-22 ENCOUNTER — Ambulatory Visit (HOSPITAL_BASED_OUTPATIENT_CLINIC_OR_DEPARTMENT_OTHER): Payer: PPO | Admitting: Oncology

## 2015-08-22 ENCOUNTER — Telehealth: Payer: Self-pay | Admitting: Oncology

## 2015-08-22 VITALS — BP 121/82 | HR 83 | Temp 98.4°F | Resp 18

## 2015-08-22 DIAGNOSIS — C649 Malignant neoplasm of unspecified kidney, except renal pelvis: Secondary | ICD-10-CM

## 2015-08-22 DIAGNOSIS — I10 Essential (primary) hypertension: Secondary | ICD-10-CM | POA: Diagnosis not present

## 2015-08-22 DIAGNOSIS — C642 Malignant neoplasm of left kidney, except renal pelvis: Secondary | ICD-10-CM | POA: Diagnosis not present

## 2015-08-22 DIAGNOSIS — C786 Secondary malignant neoplasm of retroperitoneum and peritoneum: Secondary | ICD-10-CM

## 2015-08-22 DIAGNOSIS — C342 Malignant neoplasm of middle lobe, bronchus or lung: Secondary | ICD-10-CM

## 2015-08-22 LAB — CBC WITH DIFFERENTIAL/PLATELET
BASO%: 0.5 % (ref 0.0–2.0)
Basophils Absolute: 0.1 10*3/uL (ref 0.0–0.1)
EOS%: 4.2 % (ref 0.0–7.0)
Eosinophils Absolute: 0.5 10*3/uL (ref 0.0–0.5)
HEMATOCRIT: 36.2 % — AB (ref 38.4–49.9)
HGB: 11.5 g/dL — ABNORMAL LOW (ref 13.0–17.1)
LYMPH%: 13.1 % — AB (ref 14.0–49.0)
MCH: 27.9 pg (ref 27.2–33.4)
MCHC: 31.8 g/dL — AB (ref 32.0–36.0)
MCV: 87.9 fL (ref 79.3–98.0)
MONO#: 1 10*3/uL — ABNORMAL HIGH (ref 0.1–0.9)
MONO%: 9.5 % (ref 0.0–14.0)
NEUT#: 7.9 10*3/uL — ABNORMAL HIGH (ref 1.5–6.5)
NEUT%: 72.7 % (ref 39.0–75.0)
PLATELETS: 258 10*3/uL (ref 140–400)
RBC: 4.12 10*6/uL — ABNORMAL LOW (ref 4.20–5.82)
RDW: 17.7 % — ABNORMAL HIGH (ref 11.0–14.6)
WBC: 10.9 10*3/uL — ABNORMAL HIGH (ref 4.0–10.3)
lymph#: 1.4 10*3/uL (ref 0.9–3.3)

## 2015-08-22 LAB — COMPREHENSIVE METABOLIC PANEL
ALBUMIN: 3 g/dL — AB (ref 3.5–5.0)
ALK PHOS: 93 U/L (ref 40–150)
ALT: 16 U/L (ref 0–55)
ANION GAP: 11 meq/L (ref 3–11)
AST: 12 U/L (ref 5–34)
BUN: 22.1 mg/dL (ref 7.0–26.0)
CALCIUM: 9.3 mg/dL (ref 8.4–10.4)
CHLORIDE: 107 meq/L (ref 98–109)
CO2: 20 mEq/L — ABNORMAL LOW (ref 22–29)
CREATININE: 1.5 mg/dL — AB (ref 0.7–1.3)
EGFR: 50 mL/min/{1.73_m2} — ABNORMAL LOW (ref >90)
Glucose: 99 mg/dl (ref 70–140)
POTASSIUM: 4.4 meq/L (ref 3.5–5.1)
Sodium: 137 mEq/L (ref 136–145)
Total Bilirubin: 0.32 mg/dL (ref 0.20–1.20)
Total Protein: 7.6 g/dL (ref 6.4–8.3)

## 2015-08-22 NOTE — Telephone Encounter (Signed)
Gave patient wife avs report and appointments for May. [facility] will call re pet - wife aware.

## 2015-08-22 NOTE — Progress Notes (Signed)
Hematology and Oncology Follow Up Visit  Austin [name] [id number] 04-16-1956 60 y.o. 08/22/2015 10:17 AM Austin [name], MDBurchette, [name], MD   Principle Diagnosis: 60 year old gentleman with a renal cell carcinoma diagnosed in 2013 he presented with a 4.2 cm mass in the lower pole of the left kidney. Now he has stage IV disease.   Prior Therapy: He is status [name] robotic-assisted laparoscopic nephrectomy on the left done on 04/23/2012. The pathology revealed renal cell carcinoma with a grade 3/4 with the pathological staging of T3a.   Current therapy: Votrient 800 mg daily started around 12/27/2013. This was reduced to 400 mg subsequently and have been discontinued since 02/03/2014 due to poor tolerance.  Interim History: Austin [name] presents today for a followup visit. Since his last visit, he reports no major changes in his health. He reports intermittent abdominal and chest wall pain on the right side. He describes the pain as rather dull and constant and occasionally takes Advil or Motrin for it.  He reports more fatigue and tiredness has not dramatically changed since the last visit. His appetite is reasonable. He does not report any major changes in his appetite and no changes in his quality of life.  Has not reported chest pain or difficulty breathing. Has not reported any deterioration in his health status. 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 He has not reported any hospitalization or illnesses. He has not reported any arthralgias or myalgias. As that report any lymphadenopathy or petechiae. Is not reporting any bleeding complications. Rest of review of systems unremarkable.  Medications: I have reviewed the patient's current medications.  Current Outpatient Prescriptions  Medication Sig Dispense Refill  . albuterol (PROVENTIL HFA;VENTOLIN HFA) 108 (90 BASE) MCG/ACT inhaler Inhale 2 puffs into the lungs  every 6 (six) hours as needed for wheezing or shortness of breath. 1 Inhaler 0  . atorvastatin (LIPITOR) 20 MG tablet Take 1 tablet (20 mg total) by mouth every morning. 90 tablet 2  . clopidogrel (PLAVIX) 75 MG tablet Take 1 tablet (75 mg total) by mouth at bedtime. 90 tablet 2  . CRANBERRY PO Take 1 tablet by mouth 2 (two) times daily.    Marland Kitchen escitalopram (LEXAPRO) 10 MG tablet Take 1 tablet (10 mg total) by mouth every morning. 90 tablet 2  . ketoconazole (NIZORAL) 2 % cream APPLY AS NEEDED TO RASH 60 g 1  . lisinopril-hydrochlorothiazide (PRINZIDE,ZESTORETIC) 20-12.5 MG per tablet Take 1 tablet by mouth 2 (two) times daily. 180 tablet 2  . metFORMIN (GLUCOPHAGE) 500 MG tablet Take 1 tablet (500 mg total) by mouth 2 (two) times daily with a meal. 180 tablet 2  . Multiple Vitamin (MULTIVITAMIN WITH MINERALS) TABS tablet Take 1 tablet by mouth daily.    Marland Kitchen testosterone enanthate (DELATESTRYL) 200 MG/ML injection Inject 1 mL (200 mg total) into the muscle every 14 (fourteen) days. For IM use only 15 mL 1  . traZODone (DESYREL) 100 MG tablet Take 1 tablet (100 mg total) by mouth at bedtime. 90 tablet 2  . triamcinolone cream (KENALOG) 0.1 % APPLYTO AFFECTED AREA TWICE DAILY AS NEEDED 15 g 2   No current facility-administered medications for this visit.     Allergies:  Allergies  Allergen Reactions  . Penicillins Rash    Past Medical History, Surgical history, Social history, and Family History were reviewed and updated.    Physical Exam: Blood pressure 121/82, pulse 83, temperature 98.4 F (  36.9 C), temperature source Oral, resp. rate 18, SpO2 99 %. ECOG: 2 General appearance: alert awake gentleman in a wheelchair without any distress. Head: Normocephalic, without obvious abnormality. No oral ulcers or lesions. Neck: no adenopathy Lymph nodes: Cervical, supraclavicular, and axillary nodes normal. Heart:regular rate and rhythm, S1, S2 normal, no murmur, click, rub or gallop Lung:chest  clear, no wheezing, rales, no dullness to percussion the Abdomin: soft, non-tender, without masses or organomegaly no shifting dullness or ascites. EXT: Bilateral lower showed edema noted.    Lab Results: Lab Results  Component Value Date   WBC 10.9* 08/22/2015   HGB 11.5* 08/22/2015   HCT 36.2* 08/22/2015   MCV 87.9 08/22/2015   PLT 258 08/22/2015     Chemistry      Component Value Date/Time   NA 137 08/22/2015 0930   NA 137 10/17/2013 1706   K 4.4 08/22/2015 0930   K 4.0 10/17/2013 1706   CL 102 10/17/2013 1706   CO2 20* 08/22/2015 0930   CO2 24 10/17/2013 1706   BUN 22.1 08/22/2015 0930   BUN 17 10/17/2013 1706   CREATININE 1.5* 08/22/2015 0930   CREATININE 1.1 10/17/2013 1706      Component Value Date/Time   CALCIUM 9.3 08/22/2015 0930   CALCIUM 9.6 10/17/2013 1706   ALKPHOS 93 08/22/2015 0930   ALKPHOS 78 10/17/2013 1706   AST 12 08/22/2015 0930   AST 18 10/17/2013 1706   ALT 16 08/22/2015 0930   ALT 25 10/17/2013 1706   BILITOT 0.32 08/22/2015 0930   BILITOT 0.5 10/17/2013 1706         Impression and Plan:  60 year old gentleman with the following issues:  1. Renal cell carcinoma diagnosed with a left renal mass in July of 2013 and after a nephrectomy developed peritoneal and retroperitoneal metastasis. He was started on Votrient in May of 2015 but was discontinued due to poor tolerance.   CT scan on 04/23/2015 showed progressive disease predominantly peritoneum and omentum. His disease has not dramatically changed and we see no wide spread of his cancer at this time.  His clinical status have not changed dramatically and I see no reason to change our management. He refused systemic therapy in the past and the plan is to continue with supportive care moving forward. He is interested in repeating imaging studies in the future to prevent any complications related to cancer progression. We'll obtain a PET scan in setting of a CT scan because of his poor  tolerance to oral contrast.  2. Fatigue and functional decline: Likely related to his malignancy. Relatively stable.  3. Abdominal pain: Could be related to his malignancy . He takes Tylenol and Advil with relief at this time.  4. Hypertension: Blood pressure control at this time.  5. Followup: 4 months and repeat imaging studies.  SEGBTD,VVOHY, MD 1/11/201710:17 AM

## 2015-08-28 ENCOUNTER — Encounter: Payer: Self-pay | Admitting: Oncology

## 2015-08-28 NOTE — Progress Notes (Signed)
I placed dream request form on desk for dr. Alen Blew to sign.

## 2015-08-28 NOTE — Progress Notes (Signed)
I called and left wife a message the dream form is ready for pick up at front with ms wilma

## 2015-09-04 ENCOUNTER — Other Ambulatory Visit: Payer: Self-pay | Admitting: Family Medicine

## 2015-09-12 ENCOUNTER — Other Ambulatory Visit: Payer: Self-pay | Admitting: Family Medicine

## 2015-09-18 ENCOUNTER — Telehealth: Payer: Self-pay | Admitting: Family Medicine

## 2015-09-18 NOTE — Telephone Encounter (Signed)
Wife call to say they need a script sent to have a wheel chair repaired for pt . Joy stick does not work all the time   [employer] number [phone]

## 2015-09-19 NOTE — Telephone Encounter (Signed)
Order/RX faxed to agency

## 2015-09-19 NOTE — Telephone Encounter (Signed)
written

## 2015-09-24 ENCOUNTER — Telehealth: Payer: Self-pay

## 2015-09-24 NOTE — Telephone Encounter (Signed)
written

## 2015-09-24 NOTE — Telephone Encounter (Signed)
Faxed new written RX.

## 2015-09-24 NOTE — Telephone Encounter (Signed)
RX written to fix pts joy stick on his wheel chair was incorrect. The area that is broken is beside the joy stick. They need a RX stating pt needs "wheelchair repair" Please rewrite and will fax to them at [phone].

## 2015-10-02 DIAGNOSIS — I10 Essential (primary) hypertension: Secondary | ICD-10-CM | POA: Diagnosis not present

## 2015-10-02 DIAGNOSIS — I6789 Other cerebrovascular disease: Secondary | ICD-10-CM | POA: Diagnosis not present

## 2015-10-03 ENCOUNTER — Other Ambulatory Visit: Payer: Self-pay | Admitting: Family Medicine

## 2015-10-29 ENCOUNTER — Telehealth: Payer: Self-pay | Admitting: Family Medicine

## 2015-10-29 NOTE — Telephone Encounter (Signed)
Pt scheduled  

## 2015-10-29 NOTE — Telephone Encounter (Signed)
Pt wife call to say she think pt has a bladder infection and would like to know if she can bring in a urine sample

## 2015-10-29 NOTE — Telephone Encounter (Signed)
Pt needs an office visit. Cannot treat over the phone. What if its not an UTI then he would need to come in anyway to evaluate.

## 2015-10-31 ENCOUNTER — Ambulatory Visit (INDEPENDENT_AMBULATORY_CARE_PROVIDER_SITE_OTHER): Payer: PPO | Admitting: Family Medicine

## 2015-10-31 ENCOUNTER — Encounter: Payer: Self-pay | Admitting: Family Medicine

## 2015-10-31 VITALS — BP 140/80 | HR 74 | Temp 98.4°F

## 2015-10-31 DIAGNOSIS — R8299 Other abnormal findings in urine: Secondary | ICD-10-CM

## 2015-10-31 DIAGNOSIS — R300 Dysuria: Secondary | ICD-10-CM | POA: Diagnosis not present

## 2015-10-31 DIAGNOSIS — R8290 Unspecified abnormal findings in urine: Secondary | ICD-10-CM

## 2015-10-31 LAB — POCT URINALYSIS DIPSTICK
Bilirubin, UA: NEGATIVE
GLUCOSE UA: NEGATIVE
KETONES UA: NEGATIVE
Nitrite, UA: POSITIVE
SPEC GRAV UA: 1.02
UROBILINOGEN UA: 0.2
pH, UA: 5.5

## 2015-10-31 MED ORDER — CIPROFLOXACIN HCL 500 MG PO TABS: 500.0000 mg | ORAL_TABLET | Freq: Two times a day (BID) | ORAL | Status: DC

## 2015-10-31 NOTE — Progress Notes (Signed)
Subjective:    Patient ID: Austin [name], male    DOB: [birth year], 60 y.o.   MRN: [id number]  HPI  Patient seen with dysuria  He has remote history of brainstem stroke and has chronic neurogenic bladder.  Wife does in an out cath couple times daily and has done this for years.  They've just recently noticed some cloudy urine with increased odor and yesterday had some increased sweats. Temperature not taken but wife fell sure he had some low-grade fever. No nausea or vomiting. Keeping down fluids well. No confusion. History of multiple UTIs in the past  Past Medical History  Diagnosis Date  . Hx of colonic polyps   . Hypertension   . Pituitary adenoma ([place])   . Urinary incontinence   . Venous stasis     edema  . 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 Hypogonadism   . Neuromuscular disorder ([place])     quadraplegic  . Chronic kidney disease     Left Renal Mass  . Sleep apnea     stopbang=4  . Cerebrovascular accident ([place]) 1994    hx of brainstem stroke, residual diminished lung capacity  . 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 Diabetes mellitus without complication [facility])    Past Surgical History  Procedure Laterality Date  . Tracheostomy tube placement  02/1993    removed 04/1993  . Stomach peg  02/1993    removed 5-6 years later  . Cholecystectomy    . Pituitary surgery  2007, 2011  . Gamma knife radiation surgery  05/2010    for pituitary  . Colonoscopy  02/27/2012    Procedure: COLONOSCOPY;  Surgeon: Jerene Bears, MD;  Location: [facility];  Service: Gastroenterology;  Laterality: N/A;  . Vocal cord surgery      injected with collagen, then fat from stomach to improve speech s/p stroke  . Vasectomy  1984  . Robotic partial nephrectomy  04-23-12    left   Left Renal Mass  . Robot assisted laparoscopic nephrectomy  04/23/2012    Procedure: ROBOTIC ASSISTED LAPAROSCOPIC NEPHRECTOMY;   Surgeon: Alexis Frock, MD;  Location: [facility];  Service: Urology;  Laterality: Left;  radical  . Umbilical hernia repair  04/23/2012    Procedure: HERNIA REPAIR UMBILICAL ADULT;  Surgeon: Alexis Frock, MD;  Location: [facility];  Service: Urology;;    reports that he quit smoking about 23 years ago. His smoking use included Cigarettes. He has a 11.5 pack-year smoking history. He has never used smokeless tobacco. He reports that he does not drink alcohol or use illicit drugs. family history includes Alcohol abuse in his father and father; Arthritis in his maternal grandmother and mother; Colon cancer in his brother; Esophageal cancer (age of onset: 29) in his father; Hyperlipidemia in his mother; Hypertension in his brother; Stroke in his paternal grandmother; Throat cancer in his father; Uterine cancer (age of onset: 62) in his mother. Allergies  Allergen Reactions  . Penicillins Rash       Review of Systems  Constitutional: Positive for fever, chills and fatigue.  Respiratory: Negative for cough.   Gastrointestinal: Negative for nausea and vomiting.  Genitourinary: Positive for dysuria.  Psychiatric/Behavioral: Negative for confusion.       Objective:   Physical Exam  Constitutional: He is oriented to person, place, and time. He appears well-developed and well-nourished.  HENT:  Mouth/Throat: Oropharynx  is clear and moist.  Cardiovascular: Normal rate and regular rhythm.   Pulmonary/Chest: Effort normal and breath sounds normal. No respiratory distress. He has no wheezes. He has no rales.  Neurological: He is alert and oriented to person, place, and time.          Assessment & Plan:   Dysuria. Suspect UTI. Patient and wife are aware that he is likely chronically colonized with chronic neurogenic bladder and in/out caths but with recent change in urinary symptoms along with presumed subjective fever we'll culture urine and start Cipro 500 mg twice a day for 10 days pending culture  results.

## 2015-10-31 NOTE — Patient Instructions (Signed)

## 2015-11-03 LAB — URINE CULTURE: Colony Count: >=100000

## 2015-11-21 ENCOUNTER — Ambulatory Visit (INDEPENDENT_AMBULATORY_CARE_PROVIDER_SITE_OTHER): Payer: PPO | Admitting: Family Medicine

## 2015-11-21 VITALS — BP 130/88 | HR 88 | Temp 98.5°F

## 2015-11-21 DIAGNOSIS — E785 Hyperlipidemia, unspecified: Secondary | ICD-10-CM

## 2015-11-21 DIAGNOSIS — E119 Type 2 diabetes mellitus without complications: Secondary | ICD-10-CM

## 2015-11-21 DIAGNOSIS — R7989 Other specified abnormal findings of blood chemistry: Secondary | ICD-10-CM

## 2015-11-21 DIAGNOSIS — E291 Testicular hypofunction: Secondary | ICD-10-CM | POA: Diagnosis not present

## 2015-11-21 LAB — TESTOSTERONE: Testosterone: 622.04 ng/dL (ref 300.00–890.00)

## 2015-11-21 LAB — LIPID PANEL
CHOL/HDL RATIO: 4
Cholesterol: 141 mg/dL (ref 0–200)
HDL: 35.1 mg/dL — ABNORMAL LOW (ref >39.00)
NONHDL: 106.14
TRIGLYCERIDES: 328 mg/dL — AB (ref 0.0–149.0)
VLDL: 65.6 mg/dL — ABNORMAL HIGH (ref 0.0–40.0)

## 2015-11-21 LAB — LDL CHOLESTEROL, DIRECT: LDL DIRECT: 52 mg/dL

## 2015-11-21 LAB — HEMOGLOBIN A1C: HEMOGLOBIN A1C: 6.5 % (ref 4.6–6.5)

## 2015-11-21 NOTE — Progress Notes (Signed)
Pre visit review using our clinic review tool, if applicable. No additional management support is needed unless otherwise documented below in the visit note. 

## 2015-11-21 NOTE — Progress Notes (Signed)
Subjective:    Patient ID: Austin [name], male    DOB: 1956-05-10, 60 y.o.   MRN: [id number]  HPI Patient seen for medical follow-up Multiple chronic problems including type 2 diabetes, metastatic renal cell carcinoma, pituitary tumor, chronic neurogenic bladder, hyperlipidemia, hypertension, history of CVA.  Recent CBC and comprehensive metabolic panel per oncology. He has history of chronic mild anemia. Most recent testosterone level was low which was very surprising since this has been fairly stable previously. He takes 200 mg of Depo testosterone every 2 weeks. He has some chronic fatigue which is unchanged. Hyperlipidemia treated with Lipitor.  Recent UTI fully cleared after antibiotics. Denies any recent chest pains. Medications reviewed. Compliant with all.  Past Medical History  Diagnosis Date  . Hx of colonic polyps   . Hypertension   . Pituitary adenoma ([place])   . Urinary incontinence   . Venous stasis     edema  . 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 Hypogonadism   . Neuromuscular disorder ([place])     quadraplegic  . Chronic kidney disease     Left Renal Mass  . Sleep apnea     stopbang=4  . Cerebrovascular accident ([place]) 1994    hx of brainstem stroke, residual diminished lung capacity  . 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 Diabetes mellitus without complication [facility])    Past Surgical History  Procedure Laterality Date  . Tracheostomy tube placement  02/1993    removed 04/1993  . Stomach peg  02/1993    removed 5-6 years later  . Cholecystectomy    . Pituitary surgery  2007, 2011  . Gamma knife radiation surgery  05/2010    for pituitary  . Colonoscopy  02/27/2012    Procedure: COLONOSCOPY;  Surgeon: Jerene Bears, MD;  Location: [facility];  Service: Gastroenterology;  Laterality: N/A;  . Vocal cord surgery      injected with collagen, then fat from stomach to improve  speech s/p stroke  . Vasectomy  1984  . Robotic partial nephrectomy  04-23-12    left   Left Renal Mass  . Robot assisted laparoscopic nephrectomy  04/23/2012    Procedure: ROBOTIC ASSISTED LAPAROSCOPIC NEPHRECTOMY;  Surgeon: Alexis Frock, MD;  Location: [facility];  Service: Urology;  Laterality: Left;  radical  . Umbilical hernia repair  04/23/2012    Procedure: HERNIA REPAIR UMBILICAL ADULT;  Surgeon: Alexis Frock, MD;  Location: [facility];  Service: Urology;;    reports that he quit smoking about 23 years ago. His smoking use included Cigarettes. He has a 11.5 pack-year smoking history. He has never used smokeless tobacco. He reports that he does not drink alcohol or use illicit drugs. family history includes Alcohol abuse in his father and father; Arthritis in his maternal grandmother and mother; Colon cancer in his brother; Esophageal cancer (age of onset: 60) in his father; Hyperlipidemia in his mother; Hypertension in his brother; Stroke in his paternal grandmother; Throat cancer in his father; Uterine cancer (age of onset: 46) in his mother. Allergies  Allergen Reactions  . Penicillins Rash      Review of Systems  Constitutional: Positive for fatigue.  Eyes: Negative for visual disturbance.  Respiratory: Positive for cough. Negative for chest tightness and shortness of breath.   Cardiovascular: Negative for chest pain, palpitations and leg swelling.  Gastrointestinal: Negative for nausea, vomiting, abdominal pain, diarrhea and  blood in stool.  Endocrine: Negative for polydipsia and polyuria.  Neurological: Negative for dizziness, syncope, weakness, light-headedness and headaches.  Psychiatric/Behavioral: Negative for confusion.       Objective:   Physical Exam  Constitutional: He is oriented to person, place, and time. He appears well-developed and well-nourished.  HENT:  Mouth/Throat: Oropharynx is clear and moist.  Cardiovascular: Normal rate and regular rhythm.     Pulmonary/Chest: Effort normal and breath sounds normal. No respiratory distress. He has no wheezes. He has no rales.  Musculoskeletal: He exhibits no edema.  Neurological: He is alert and oriented to person, place, and time.          Assessment & Plan:  #1 type 2 diabetes. History of good control. Recheck A1c.  #2 hyperlipidemia. Recheck lipid panel. Had recent hepatic function which was essentially normal. Continue Lipitor  #3 low testosterone. Recheck levels. Adjust medication if still low

## 2015-11-30 ENCOUNTER — Other Ambulatory Visit: Payer: Self-pay | Admitting: Family Medicine

## 2015-12-25 ENCOUNTER — Other Ambulatory Visit (HOSPITAL_BASED_OUTPATIENT_CLINIC_OR_DEPARTMENT_OTHER): Payer: PPO

## 2015-12-25 ENCOUNTER — Ambulatory Visit (HOSPITAL_COMMUNITY)
Admission: RE | Admit: 2015-12-25 | Discharge: 2015-12-25 | Disposition: A | Discharge status: Home / Self Care | Payer: PPO | Source: Ambulatory Visit | Attending: Oncology | Admitting: Oncology

## 2015-12-25 DIAGNOSIS — E279 Disorder of adrenal gland, unspecified: Secondary | ICD-10-CM | POA: Diagnosis not present

## 2015-12-25 DIAGNOSIS — C649 Malignant neoplasm of unspecified kidney, except renal pelvis: Secondary | ICD-10-CM

## 2015-12-25 DIAGNOSIS — C342 Malignant neoplasm of middle lobe, bronchus or lung: Secondary | ICD-10-CM | POA: Diagnosis not present

## 2015-12-25 DIAGNOSIS — D7389 Other diseases of spleen: Secondary | ICD-10-CM | POA: Insufficient documentation

## 2015-12-25 DIAGNOSIS — C786 Secondary malignant neoplasm of retroperitoneum and peritoneum: Secondary | ICD-10-CM | POA: Insufficient documentation

## 2015-12-25 DIAGNOSIS — R918 Other nonspecific abnormal finding of lung field: Secondary | ICD-10-CM | POA: Insufficient documentation

## 2015-12-25 DIAGNOSIS — C642 Malignant neoplasm of left kidney, except renal pelvis: Secondary | ICD-10-CM | POA: Diagnosis not present

## 2015-12-25 LAB — CBC WITH DIFFERENTIAL/PLATELET
BASO%: 0.6 % (ref 0.0–2.0)
BASOS ABS: 0.1 10*3/uL (ref 0.0–0.1)
EOS ABS: 0.6 10*3/uL — AB (ref 0.0–0.5)
EOS%: 4.5 % (ref 0.0–7.0)
HCT: 36.4 % — ABNORMAL LOW (ref 38.4–49.9)
HGB: 11.9 g/dL — ABNORMAL LOW (ref 13.0–17.1)
LYMPH%: 8.3 % — AB (ref 14.0–49.0)
MCH: 27.4 pg (ref 27.2–33.4)
MCHC: 32.7 g/dL (ref 32.0–36.0)
MCV: 83.9 fL (ref 79.3–98.0)
MONO#: 1 10*3/uL — AB (ref 0.1–0.9)
MONO%: 7.3 % (ref 0.0–14.0)
NEUT#: 11.2 10*3/uL — ABNORMAL HIGH (ref 1.5–6.5)
NEUT%: 79.3 % — AB (ref 39.0–75.0)
PLATELETS: 252 10*3/uL (ref 140–400)
RBC: 4.34 10*6/uL (ref 4.20–5.82)
RDW: 17.8 % — ABNORMAL HIGH (ref 11.0–14.6)
WBC: 14.2 10*3/uL — ABNORMAL HIGH (ref 4.0–10.3)
lymph#: 1.2 10*3/uL (ref 0.9–3.3)

## 2015-12-25 LAB — COMPREHENSIVE METABOLIC PANEL
ALT: 13 U/L (ref 0–55)
AST: 12 U/L (ref 5–34)
Albumin: 3.3 g/dL — ABNORMAL LOW (ref 3.5–5.0)
Alkaline Phosphatase: 76 U/L (ref 40–150)
Anion Gap: 7 mEq/L (ref 3–11)
BUN: 26.4 mg/dL — AB (ref 7.0–26.0)
CALCIUM: 9.6 mg/dL (ref 8.4–10.4)
CHLORIDE: 108 meq/L (ref 98–109)
CO2: 20 meq/L — AB (ref 22–29)
Creatinine: 1.4 mg/dL — ABNORMAL HIGH (ref 0.7–1.3)
EGFR: 54 mL/min/{1.73_m2} — ABNORMAL LOW (ref >90)
Glucose: 92 mg/dl (ref 70–140)
Sodium: 136 mEq/L (ref 136–145)
Total Bilirubin: <0.3 mg/dL (ref 0.20–1.20)
Total Protein: 8 g/dL (ref 6.4–8.3)

## 2015-12-25 LAB — GLUCOSE, CAPILLARY: GLUCOSE-CAPILLARY: 98 mg/dL (ref 65–99)

## 2015-12-25 MED ORDER — FLUDEOXYGLUCOSE F - 18 (FDG) INJECTION
9.2700 | Freq: Once | INTRAVENOUS | Status: AC | PRN
  Administered 2015-12-25: 9.27 via INTRAVENOUS

## 2015-12-27 ENCOUNTER — Ambulatory Visit (HOSPITAL_BASED_OUTPATIENT_CLINIC_OR_DEPARTMENT_OTHER): Payer: PPO | Admitting: Oncology

## 2015-12-27 ENCOUNTER — Telehealth: Payer: Self-pay | Admitting: Oncology

## 2015-12-27 VITALS — BP 138/71 | HR 78 | Temp 97.8°F | Resp 18 | Ht 72.0 in

## 2015-12-27 DIAGNOSIS — C642 Malignant neoplasm of left kidney, except renal pelvis: Secondary | ICD-10-CM

## 2015-12-27 DIAGNOSIS — R109 Unspecified abdominal pain: Secondary | ICD-10-CM | POA: Diagnosis not present

## 2015-12-27 DIAGNOSIS — C7889 Secondary malignant neoplasm of other digestive organs: Secondary | ICD-10-CM | POA: Diagnosis not present

## 2015-12-27 DIAGNOSIS — C649 Malignant neoplasm of unspecified kidney, except renal pelvis: Secondary | ICD-10-CM

## 2015-12-27 DIAGNOSIS — R5383 Other fatigue: Secondary | ICD-10-CM | POA: Diagnosis not present

## 2015-12-27 MED ORDER — TRAMADOL HCL 50 MG PO TABS: 50.0000 mg | ORAL_TABLET | Freq: Four times a day (QID) | ORAL | Status: DC | PRN

## 2015-12-27 NOTE — Telephone Encounter (Signed)
Gave and printed appt sched and avs for pt for Aug °

## 2015-12-27 NOTE — Progress Notes (Signed)
Hematology and Oncology Follow Up Visit  Austin [name] [id number] [birth year] 60 y.o. 12/27/2015 10:10 AM Austin [name], MDBurchette, Austin Sierras, MD   Principle Diagnosis: 60 year old man with a renal cell carcinoma diagnosed in 2013 he presented with a 4.2 cm mass in the lower pole of the left kidney. Now he has stage IV disease. He has peritoneal involvement as well as splenic metastasis. 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 The pathology revealed renal cell carcinoma with a grade 3/4 with the pathological staging of T3a.   Current therapy: Votrient 800 mg daily started around 12/27/2013. This was reduced to 400 mg subsequently and have been discontinued since 02/03/2014 due to poor tolerance.  Interim History: Austin [name] presents today for a followup visit. Since his last visit, he reports further decline in his health. He reports slight increase abdominal pain and decrease in his appetite and energy. His wife is having difficulties and assisting him in transfer because of increased pain. His pain is predominantly in the shoulder as well as the abdomen. He is still quite dependent on his wife and activities of daily living and does use a motorized scooter for mobility.  He had any headaches, blurry vision, syncope or seizures. He does not report any fevers or chills or sweats. Has not reported chest pain or difficulty breathing. Has not reported any nausea, vomiting or early satiety. Has not reported any hematochezia or melena. He has not reported any arthralgias or myalgias. As that report any lymphadenopathy or petechiae. Is not reporting any bleeding complications. Rest of review of systems unremarkable.  Medications: I have reviewed the patient's current medications.  Current Outpatient Prescriptions  Medication Sig Dispense Refill  . albuterol  (PROVENTIL HFA;VENTOLIN HFA) 108 (90 BASE) MCG/ACT inhaler Inhale 2 puffs into the lungs every 6 (six) hours as needed for wheezing or shortness of breath. 1 Inhaler 0  . atorvastatin (LIPITOR) 20 MG tablet TAKE ONE TABLET BY MOUTH ONCE DAILY IN THE MORNING 90 tablet 0  . clopidogrel (PLAVIX) 75 MG tablet TAKE ONE TABLET BY MOUTH AT BEDTIME 90 tablet 1  . CRANBERRY PO Take 1 tablet by mouth 2 (two) times daily.    Marland Kitchen escitalopram (LEXAPRO) 10 MG tablet TAKE ONE TABLET BY MOUTH ONCE DAILY IN THE MORNING 90 tablet 1  . ketoconazole (NIZORAL) 2 % cream APPLY AS NEEDED TO RASH 60 g 1  . lisinopril-hydrochlorothiazide (PRINZIDE,ZESTORETIC) 20-12.5 MG tablet TAKE ONE TABLET BY MOUTH TWICE DAILY 180 tablet 1  . metFORMIN (GLUCOPHAGE) 500 MG tablet TAKE ONE TABLET BY MOUTH TWICE DAILY WITH A MEAL 180 tablet 1  . Multiple Vitamin (MULTIVITAMIN WITH MINERALS) TABS tablet Take 1 tablet by mouth daily.    Marland Kitchen testosterone enanthate (DELATESTRYL) 200 MG/ML injection Inject 1 mL (200 mg total) into the muscle every 14 (fourteen) days. For IM use only 15 mL 1  . traZODone (DESYREL) 100 MG tablet TAKE ONE TABLET BY MOUTH AT BEDTIME 90 tablet 1  . triamcinolone cream (KENALOG) 0.1 % APPLYTO AFFECTED AREA TWICE DAILY AS NEEDED 15 g 2  . traMADol (ULTRAM) 50 MG tablet Take 1 tablet (50 mg total) by mouth every 6 (six) hours as needed. 60 tablet 0   No current facility-administered medications for this visit.     Allergies:  Allergies  Allergen Reactions  . Penicillins Rash    Past Medical  History, Surgical history, Social history, and Family History were reviewed and updated.    Physical Exam: Blood pressure 138/71, pulse 78, temperature 97.8 F (36.6 C), temperature source Oral, resp. rate 18, height 6' (1.829 m), SpO2 100 %. ECOG: 2 General appearance: Alert, awake gentleman appeared without distress. Head: Normocephalic, without obvious abnormality. No oral ulcers or lesions. Neck: no  adenopathy Lymph nodes: Cervical, supraclavicular, and axillary nodes normal. Heart:regular rate and rhythm, S1, S2 normal, no murmur, click, rub or gallop Lung:chest clear, no wheezing, rales, no dullness to percussion the Abdomin: soft, non-tender, without masses or organomegaly no shifting dullness or ascites. No rebound or guarding. EXT: Bilateral lower showed edema noted.    Lab Results: Lab Results  Component Value Date   WBC 14.2* 12/25/2015   HGB 11.9* 12/25/2015   HCT 36.4* 12/25/2015   MCV 83.9 12/25/2015   PLT 252 12/25/2015     Chemistry      Component Value Date/Time   NA 136 12/25/2015 0935   NA 137 10/17/2013 1706   K 5.5 No visable hemolysis* 12/25/2015 0935   K 4.0 10/17/2013 1706   CL 102 10/17/2013 1706   CO2 20* 12/25/2015 0935   CO2 24 10/17/2013 1706   BUN 26.4* 12/25/2015 0935   BUN 17 10/17/2013 1706   CREATININE 1.4* 12/25/2015 0935   CREATININE 1.1 10/17/2013 1706      Component Value Date/Time   CALCIUM 9.6 12/25/2015 0935   CALCIUM 9.6 10/17/2013 1706   ALKPHOS 76 12/25/2015 0935   ALKPHOS 78 10/17/2013 1706   AST 12 12/25/2015 0935   AST 18 10/17/2013 1706   ALT 13 12/25/2015 0935   ALT 25 10/17/2013 1706   BILITOT <0.30 12/25/2015 0935   BILITOT 0.5 10/17/2013 1706      EXAM: CT CHEST WITH CONTRAST  CT ABDOMEN AND PELVIS WITH AND WITHOUT CONTRAST  TECHNIQUE: Multidetector CT imaging of the chest was performed during intravenous contrast administration. Multidetector CT imaging of the abdomen and pelvis was performed following the standard protocol before and during bolus administration of intravenous contrast.  CONTRAST: 135m OMNIPAQUE IOHEXOL 300 MG/ML SOLN  COMPARISON: 08/23/2014  FINDINGS: CT CHEST FINDINGS  Mediastinum/Nodes: No supraclavicular adenopathy. Mild bilateral gynecomastia. Aortic and branch vessel atherosclerosis. Normal heart size, without pericardial effusion. Multivessel coronary  artery atherosclerosis. Right coronary and left anterior descending atherosclerosis. No central pulmonary embolism, on this non-dedicated study. No mediastinal or hilar adenopathy.  Lungs/Pleura: No pleural fluid. Mild degradation secondary to motion and patient arm position.  Mild centrilobular emphysema. Possible 6 mm left lower lobe pulmonary nodule on image 78 of series 6. This is at the site of motion degradation. Also image 17 of series 3. This is likely new or enlarged since the prior exam.  Musculoskeletal: No acute osseous abnormality.  CT ABDOMEN PELVIS FINDINGS  Hepatobiliary: Motion degradation continuing into the abdomen. Normal liver. Cholecystectomy, without biliary ductal dilatation.  Pancreas: Normal, without mass or ductal dilatation.  Spleen: Anterior splenic lesion measures 4.0 cm on image 93 versus 3.2 cm on the prior.  Adrenals/Urinary Tract: Minimal right adrenal nodularity likely similar. Left adrenal nodularity which measures 1.4 cm on image 109 of series 4. This is similar to the prior exam (when remeasured). More inferiorly, nodularity within the left adrenal measures 1.3 cm on image 1 and 15. This may be slightly increased.  Status [name] left nephrectomy, with fat necrosis in the operative bed. No locally recurrent disease identified. An exophytic lower pole right renal lesion  posteriorly is too small to characterize at 8 mm, including on image 132 of series 4. Favored to represent a hemorrhagic cyst. There is also a too more central right lower lobe too small to characterize lesion. Normal right ureter and urinary bladder.  Stomach/Bowel: Normal stomach, without wall thickening. Normal colon, appendix, and terminal ileum. Normal small bowel caliber. Small bowel is again positioned within an area of ventral abdominal pelvic wall laxity.  Vascular/Lymphatic: Aortic and branch vessel atherosclerosis. No abdominopelvic  adenopathy.  Reproductive: Normal prostate. No significant free fluid.  Other: Heterogeneous left paracentral omental mass measures 4.4 x 4.8 cm on image 139. Compare 4.1 x 3.6 cm on the prior exam.  A nodule along the right paracolic gutter measures 4.3 x 3.3 cm on image 150. Compare 3.7 x 2.9 cm on the prior.  Musculoskeletal: Mild osteopenia. Scattered bone islands.  IMPRESSION: 1. Mild progression of peritoneal metastasis. 2. Enlargement of a splenic lesion, also suspicious for progressive metastasis. 3. Left nephrectomy without locally recurrent disease. 4. Left adrenal nodularity similar to minimally increased. Recommend attention on follow-up. 5. Motion degraded AP exam. Apparent nodularity at the left lung base could be due to volume averaging of atelectasis. Otherwise, no metastatic disease identified within the chest. 6. Emphysema and coronary artery atherosclerosis. 7. Gynecomastia.    Impression and Plan:  60 year old gentleman with the following issues:  1. Renal cell carcinoma diagnosed with a left renal mass in July of 2013 and after a nephrectomy developed peritoneal and retroperitoneal metastasis. He was started on Votrient in May of 2015 but was discontinued due to poor tolerance.   He has been on observation and surveillance since that time. PET CT scan obtained on 12/25/2015 was personally reviewed and discussed with the patient and his wife. His disease has progressed but not rapidly without any organ compromise. I see no findings that suggest local intervention with surgery or radiation would benefit his symptoms. The plan is to continue with observation and surveillance and treat him symptomatically.  2. Fatigue and functional decline: Likely related to his malignancy. Relatively stable.  3. Abdominal pain: Related to mild progression of his peritoneal metastasis. He is willing to try stronger pain medication such as tramadol which I have prescribed  to him. He declined narcotic analgesia at this time.  4. Prognosis: Clearly he has an incurable malignancy that will continue to progress and ultimately lead to his death. I have offered him hospice care at this time and he and his wife are considering it.  5. Followup: 3 months to check on his clinical status.Zola Button, MD 5/18/201710:10 AM

## 2016-01-08 ENCOUNTER — Telehealth: Payer: Self-pay | Admitting: Family Medicine

## 2016-01-08 DIAGNOSIS — R509 Fever, unspecified: Secondary | ICD-10-CM

## 2016-01-08 NOTE — Telephone Encounter (Signed)
Spoke with wife. Over the weekend pt has had a low grade fever, hematuria, and pus on the end of his cathter. I have entered an order for culture. She will bring specimen tomorrow.

## 2016-01-08 NOTE — Telephone Encounter (Signed)
Does this pt need to come in?

## 2016-01-08 NOTE — Telephone Encounter (Signed)
Pt has been urinating a lot with cloudy urine ?bladder inf . [facility] on [address]. Please advise

## 2016-01-08 NOTE — Telephone Encounter (Signed)
He will likely always be colonized because of neurogenic bladder. If he is having a fever or severe symptoms obtain urine culture

## 2016-01-09 ENCOUNTER — Other Ambulatory Visit (INDEPENDENT_AMBULATORY_CARE_PROVIDER_SITE_OTHER): Payer: PPO

## 2016-01-09 DIAGNOSIS — R509 Fever, unspecified: Secondary | ICD-10-CM | POA: Diagnosis not present

## 2016-01-11 MED ORDER — CIPROFLOXACIN HCL 500 MG PO TABS: 500.0000 mg | ORAL_TABLET | Freq: Two times a day (BID) | ORAL | Status: DC

## 2016-01-12 LAB — URINE CULTURE: Colony Count: >=100000

## 2016-01-28 ENCOUNTER — Telehealth: Payer: Self-pay | Admitting: Family Medicine

## 2016-01-28 ENCOUNTER — Other Ambulatory Visit (INDEPENDENT_AMBULATORY_CARE_PROVIDER_SITE_OTHER): Payer: PPO

## 2016-01-28 DIAGNOSIS — R8299 Other abnormal findings in urine: Secondary | ICD-10-CM | POA: Diagnosis not present

## 2016-01-28 DIAGNOSIS — R8290 Unspecified abnormal findings in urine: Secondary | ICD-10-CM

## 2016-01-28 MED ORDER — CIPROFLOXACIN HCL 500 MG PO TABS: 500.0000 mg | ORAL_TABLET | Freq: Two times a day (BID) | ORAL | Status: DC

## 2016-01-28 NOTE — Telephone Encounter (Signed)
OK to send culture.

## 2016-01-28 NOTE — Telephone Encounter (Signed)
Wife states pt is having poss UTI symptoms with spasms. Cloudy urine, whitish drainage with catheter. Wife has a specimen if she can bring in.  Last UTI was in March. Pt has a lot going on. Wife states the cipro helped last time.  Wife would like to know something ASAP  [facility]

## 2016-01-28 NOTE — Telephone Encounter (Signed)
Wife is aware to drop off urine for culture. Cipro sent in for patient to start until we get back results. Wife is aware of this.

## 2016-01-28 NOTE — Telephone Encounter (Signed)
Please advise 

## 2016-01-31 LAB — URINE CULTURE

## 2016-02-11 ENCOUNTER — Other Ambulatory Visit: Payer: Self-pay | Admitting: Family Medicine

## 2016-02-11 NOTE — Telephone Encounter (Signed)
Refill for 6 months. 

## 2016-02-15 ENCOUNTER — Encounter: Payer: Self-pay | Admitting: Family Medicine

## 2016-02-15 ENCOUNTER — Ambulatory Visit (INDEPENDENT_AMBULATORY_CARE_PROVIDER_SITE_OTHER): Payer: PPO | Admitting: Family Medicine

## 2016-02-15 VITALS — BP 130/80 | HR 83 | Temp 97.9°F

## 2016-02-15 DIAGNOSIS — E119 Type 2 diabetes mellitus without complications: Secondary | ICD-10-CM | POA: Diagnosis not present

## 2016-02-15 DIAGNOSIS — R300 Dysuria: Secondary | ICD-10-CM | POA: Diagnosis not present

## 2016-02-15 DIAGNOSIS — N319 Neuromuscular dysfunction of bladder, unspecified: Secondary | ICD-10-CM | POA: Diagnosis not present

## 2016-02-15 DIAGNOSIS — N390 Urinary tract infection, site not specified: Secondary | ICD-10-CM

## 2016-02-15 DIAGNOSIS — C649 Malignant neoplasm of unspecified kidney, except renal pelvis: Secondary | ICD-10-CM

## 2016-02-15 MED ORDER — TRIMETHOPRIM 100 MG PO TABS: 100.0000 mg | ORAL_TABLET | Freq: Every day | ORAL | Status: DC

## 2016-02-15 MED ORDER — SULFAMETHOXAZOLE-TRIMETHOPRIM 800-160 MG PO TABS: 1.0000 | ORAL_TABLET | Freq: Two times a day (BID) | ORAL | Status: DC

## 2016-02-15 NOTE — Progress Notes (Signed)
Subjective:    Patient ID: Austin [name], male    DOB: Nov 06, 1955, 60 y.o.   MRN: [id number]  HPI Patient here to discuss recurrent UTI issues Long history of recurrent UTIs related to neurogenic bladder. He wears external catheter and does in an out catheterizations twice daily. Wife is diligent to follow sterile precautions. They are aware that he is chronically colonized-very likely. With infections, usually has fever, chills, along with cloudy urine and increased odor. He has grown out various species over the past year including Serratia, Providencia, and Enterobacter. He took [place] for several months for prevention but most recent infections have been resistant both to Keflex and Macrobid.  He has multiple chronic problems including type 2 diabetes, metastatic renal cell carcinoma, history of pituitary tumor, low testosterone, hyperlipidemia, hypertension, chronic leg edema, history of CVA, and hypothyroidism. Medications reviewed. Compliant with all. Blood sugars have been stable. Last A1c in April 6.5%.Marland Kitchen    Recent PET scan did not reveal any significant progression of his metastatic renal disease  Past Medical History  Diagnosis Date  . Hx of colonic polyps   . Hypertension   . Pituitary adenoma ([place])   . Urinary incontinence   . Venous stasis     edema  . 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 Hypogonadism   . Neuromuscular disorder ([place])     quadraplegic  . Chronic kidney disease     Left Renal Mass  . Sleep apnea     stopbang=4  . Cerebrovascular accident ([place]) 1994    hx of brainstem stroke, residual diminished lung capacity  . 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 Diabetes mellitus without complication [facility])    Past Surgical History  Procedure Laterality Date  . Tracheostomy tube placement  02/1993    removed 04/1993  . Stomach peg  02/1993    removed 5-6 years later  .  Cholecystectomy    . Pituitary surgery  2007, 2011  . Gamma knife radiation surgery  05/2010    for pituitary  . Colonoscopy  02/27/2012    Procedure: COLONOSCOPY;  Surgeon: Jerene Bears, MD;  Location: [facility];  Service: Gastroenterology;  Laterality: N/A;  . Vocal cord surgery      injected with collagen, then fat from stomach to improve speech s/p stroke  . Vasectomy  1984  . Robotic partial nephrectomy  04-23-12    left   Left Renal Mass  . Robot assisted laparoscopic nephrectomy  04/23/2012    Procedure: ROBOTIC ASSISTED LAPAROSCOPIC NEPHRECTOMY;  Surgeon: Alexis Frock, MD;  Location: [facility];  Service: Urology;  Laterality: Left;  radical  . Umbilical hernia repair  04/23/2012    Procedure: HERNIA REPAIR UMBILICAL ADULT;  Surgeon: Alexis Frock, MD;  Location: [facility];  Service: Urology;;    reports that he quit smoking about 23 years ago. His smoking use included Cigarettes. He has a 11.5 pack-year smoking history. He has never used smokeless tobacco. He reports that he does not drink alcohol or use illicit drugs. family history includes Alcohol abuse in his father and father; Arthritis in his maternal grandmother and mother; Colon cancer in his brother; Esophageal cancer (age of onset: 21) in his father; Hyperlipidemia in his mother; Hypertension in his brother; Stroke in his paternal grandmother; Throat cancer in his father; Uterine cancer (age of onset: 9) in his mother. Allergies  Allergen Reactions  .  Penicillins Rash      Review of Systems  Constitutional: Positive for chills and fatigue. Negative for fever.  Respiratory: Negative for cough.   Cardiovascular: Negative for chest pain.  Gastrointestinal: Negative for nausea, vomiting and abdominal pain.  Genitourinary: Positive for dysuria. Negative for hematuria and flank pain.  Neurological: Negative for dizziness.  Psychiatric/Behavioral: Negative for confusion.       Objective:   Physical Exam  Constitutional:  He is oriented to person, place, and time. He appears well-developed and well-nourished.  Neck: Neck supple.  Cardiovascular: Normal rate and regular rhythm.   Pulmonary/Chest: Effort normal and breath sounds normal. No respiratory distress. He has no wheezes. He has no rales.  Abdominal: Soft. There is no tenderness.  Musculoskeletal: He exhibits edema.  Trace edema legs bilaterally which is chronic  Neurological: He is alert and oriented to person, place, and time.  Psychiatric: He has a normal mood and affect. His behavior is normal.          Assessment & Plan:  #1 recurrent UTIs with history of neurogenic bladder. High risk with neurogenic bladder and in an out catheterizations. We've discussed at some length in the past that he is colonized. However, he has had intermittent fever which has correlated with episodes of infection. Current symptoms started a couple days ago. He is currently afebrile. Urine culture sent. Start Septra DS 1 twice a day for 10 days pending culture results. Wrote prescription for trimethoprim 100 mg 1 daily to start after current infection treated. Previously took [place] but most recent infections have been resistant to both the Macrobid and Keflex.  #2 type 2 diabetes. History of good control. Continue current regimen  #3 metastatic renal cell carcinoma. Patient and wife have decided against any further treatments.  He does not currently have any significant bone pain  [name] MD [facility]

## 2016-02-15 NOTE — Patient Instructions (Signed)
Finish out the [employer] DS for 10 days and then start with once daily Trimethoprim

## 2016-02-15 NOTE — Progress Notes (Signed)
Pre visit review using our clinic review tool, if applicable. No additional management support is needed unless otherwise documented below in the visit note. 

## 2016-02-18 ENCOUNTER — Other Ambulatory Visit: Payer: Self-pay | Admitting: Family Medicine

## 2016-02-19 LAB — URINE CULTURE

## 2016-02-27 ENCOUNTER — Other Ambulatory Visit: Payer: Self-pay | Admitting: Family Medicine

## 2016-02-29 ENCOUNTER — Telehealth: Payer: Self-pay | Admitting: Family Medicine

## 2016-02-29 NOTE — Telephone Encounter (Signed)
written

## 2016-02-29 NOTE — Telephone Encounter (Signed)
Fax prescription for patient.

## 2016-02-29 NOTE — Telephone Encounter (Signed)
Pt need rx for wheel chair repairs  please fax [facility] [phone]

## 2016-02-29 NOTE — Telephone Encounter (Signed)
Can you write this on a script pad. Thanks.

## 2016-03-12 ENCOUNTER — Other Ambulatory Visit: Payer: Self-pay | Admitting: Family Medicine

## 2016-03-27 ENCOUNTER — Other Ambulatory Visit (HOSPITAL_BASED_OUTPATIENT_CLINIC_OR_DEPARTMENT_OTHER): Payer: PPO

## 2016-03-27 ENCOUNTER — Telehealth: Payer: Self-pay | Admitting: Oncology

## 2016-03-27 ENCOUNTER — Ambulatory Visit (HOSPITAL_BASED_OUTPATIENT_CLINIC_OR_DEPARTMENT_OTHER): Payer: PPO | Admitting: Oncology

## 2016-03-27 VITALS — BP 127/85 | HR 76 | Temp 97.6°F | Resp 16

## 2016-03-27 DIAGNOSIS — C642 Malignant neoplasm of left kidney, except renal pelvis: Secondary | ICD-10-CM | POA: Diagnosis not present

## 2016-03-27 DIAGNOSIS — C649 Malignant neoplasm of unspecified kidney, except renal pelvis: Secondary | ICD-10-CM

## 2016-03-27 DIAGNOSIS — C7989 Secondary malignant neoplasm of other specified sites: Secondary | ICD-10-CM

## 2016-03-27 DIAGNOSIS — R5383 Other fatigue: Secondary | ICD-10-CM | POA: Diagnosis not present

## 2016-03-27 DIAGNOSIS — G893 Neoplasm related pain (acute) (chronic): Secondary | ICD-10-CM

## 2016-03-27 DIAGNOSIS — C786 Secondary malignant neoplasm of retroperitoneum and peritoneum: Secondary | ICD-10-CM

## 2016-03-27 LAB — COMPREHENSIVE METABOLIC PANEL WITH GFR
ALT: 32 U/L (ref 0–55)
AST: 19 U/L (ref 5–34)
Albumin: 3.1 g/dL — ABNORMAL LOW (ref 3.5–5.0)
Alkaline Phosphatase: 92 U/L (ref 40–150)
Anion Gap: 10 meq/L (ref 3–11)
BUN: 31 mg/dL — ABNORMAL HIGH (ref 7.0–26.0)
CO2: 18 meq/L — ABNORMAL LOW (ref 22–29)
Calcium: 10.1 mg/dL (ref 8.4–10.4)
Chloride: 108 meq/L (ref 98–109)
Creatinine: 1.6 mg/dL — ABNORMAL HIGH (ref 0.7–1.3)
EGFR: 46 ml/min/1.73 m2 — ABNORMAL LOW
Glucose: 88 mg/dL (ref 70–140)
Potassium: 5.3 meq/L — ABNORMAL HIGH (ref 3.5–5.1)
Sodium: 136 meq/L (ref 136–145)
Total Bilirubin: <0.3 mg/dL (ref 0.20–1.20)
Total Protein: 8 g/dL (ref 6.4–8.3)

## 2016-03-27 LAB — CBC WITH DIFFERENTIAL/PLATELET
BASO%: 0.9 % (ref 0.0–2.0)
Basophils Absolute: 0.1 10e3/uL (ref 0.0–0.1)
EOS%: 3.7 % (ref 0.0–7.0)
Eosinophils Absolute: 0.4 10e3/uL (ref 0.0–0.5)
HCT: 38.7 % (ref 38.4–49.9)
HGB: 12.5 g/dL — ABNORMAL LOW (ref 13.0–17.1)
LYMPH%: 9.5 % — ABNORMAL LOW (ref 14.0–49.0)
MCH: 27.3 pg (ref 27.2–33.4)
MCHC: 32.3 g/dL (ref 32.0–36.0)
MCV: 84.5 fL (ref 79.3–98.0)
MONO#: 1.1 10e3/uL — ABNORMAL HIGH (ref 0.1–0.9)
MONO%: 9.2 % (ref 0.0–14.0)
NEUT#: 9.3 10e3/uL — ABNORMAL HIGH (ref 1.5–6.5)
NEUT%: 76.7 % — ABNORMAL HIGH (ref 39.0–75.0)
Platelets: 276 10e3/uL (ref 140–400)
RBC: 4.59 10e6/uL (ref 4.20–5.82)
RDW: 18.3 % — ABNORMAL HIGH (ref 11.0–14.6)
WBC: 12.2 10e3/uL — ABNORMAL HIGH (ref 4.0–10.3)
lymph#: 1.2 10e3/uL (ref 0.9–3.3)

## 2016-03-27 NOTE — Progress Notes (Signed)
Hematology and Oncology Follow Up Visit  Austin [name] [id number] 1956/08/05 60 y.o. 03/27/2016 10:10 AM [name], MDBurchette, [name], MD   Principle Diagnosis: 60 year old man with a renal cell carcinoma diagnosed in 2013 he presented with a 4.2 cm mass in the lower pole of the left kidney. Now he has stage IV disease. He has peritoneal involvement as well as splenic metastasis. 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 He is status post robotic-assisted laparoscopic nephrectomy on the left done on 04/23/2012. The pathology revealed renal cell carcinoma with a grade 3/4 with the pathological staging of T3a.   2. Votrient 800 mg daily started around 12/27/2013. This was reduced to 400 mg subsequently and have been discontinued since 02/03/2014 due to poor tolerance.  Current therapy: Observation and surveillance.  Interim History: [name] presents today for a followup visit with his wife. Since his last visit, he reports no major changes at this time. He reports his pain level have been manageable with ultram which she takes about once a day. He reports his appetite about the same and have not changed dramatically. He does report quite a bit of fatigue which have not changed. He still relies on his wife for most activities of daily living. He denied any falls or syncope. His pain is predominantly in the shoulder and the epigastric area.   He had any headaches, blurry vision, syncope or seizures. He does not report any fevers or chills or sweats. Has not reported chest pain or difficulty breathing. Has not reported any nausea, vomiting or early satiety. Has not reported any hematochezia or melena. He has not reported any arthralgias or myalgias. As that report any lymphadenopathy or petechiae. Is not reporting any bleeding complications. Rest of review of systems unremarkable.  Medications: I have reviewed the patient's current  medications.  Current Outpatient Prescriptions  Medication Sig Dispense Refill  . albuterol (PROVENTIL HFA;VENTOLIN HFA) 108 (90 BASE) MCG/ACT inhaler Inhale 2 puffs into the lungs every 6 (six) hours as needed for wheezing or shortness of breath. 1 Inhaler 0  . atorvastatin (LIPITOR) 20 MG tablet TAKE ONE TABLET BY MOUTH ONCE DAILY IN THE MORNING 90 tablet 1  . clopidogrel (PLAVIX) 75 MG tablet TAKE ONE TABLET BY MOUTH AT BEDTIME 90 tablet 1  . CRANBERRY PO Take 1 tablet by mouth 2 (two) times daily.    Marland Kitchen escitalopram (LEXAPRO) 10 MG tablet TAKE ONE TABLET BY MOUTH ONCE DAILY IN THE MORNING. 90 tablet 1  . ketoconazole (NIZORAL) 2 % cream APPLY AS NEEDED TO RASH 60 g 1  . lisinopril-hydrochlorothiazide (PRINZIDE,ZESTORETIC) 20-12.5 MG tablet TAKE ONE TABLET BY MOUTH TWICE DAILY 180 tablet 2  . metFORMIN (GLUCOPHAGE) 500 MG tablet TAKE ONE TABLET BY MOUTH TWICE DAILY WITH A MEAL 180 tablet 2  . Multiple Vitamin (MULTIVITAMIN WITH MINERALS) TABS tablet Take 1 tablet by mouth daily.    Marland Kitchen testosterone enanthate (DELATESTRYL) 200 MG/ML injection INJECT 1 ML INTRAMUSCULARLY EVERY 2 WEEKS 15 mL 5  . traMADol (ULTRAM) 50 MG tablet Take 1 tablet (50 mg total) by mouth every 6 (six) hours as needed. 60 tablet 0  . traZODone (DESYREL) 100 MG tablet TAKE ONE TABLET BY MOUTH AT BEDTIME 90 tablet 1  . triamcinolone cream (KENALOG) 0.1 % APPLYTO AFFECTED AREA TWICE DAILY AS NEEDED 15 g 2  . trimethoprim (TRIMPEX) 100 MG tablet Take 1 tablet (100 mg total) by mouth  daily. 30 tablet 5   No current facility-administered medications for this visit.      Allergies:  Allergies  Allergen Reactions  . Penicillins Rash    Past Medical History, Surgical history, Social history, and Family History were reviewed and updated.    Physical Exam: Blood pressure 127/85, pulse 76, temperature 97.6 F (36.4 C), temperature source Oral, resp. rate 16, SpO2 99 %. ECOG: 2 General appearance: Pleasant-appearing  gentleman without distress. He was awake and alert. Head: Normocephalic, without obvious abnormality. No oral thrush noted. Neck: no adenopathy or thyroid masses. Lymph nodes: Cervical, supraclavicular, and axillary nodes normal. Heart:regular rate and rhythm, S1, S2 normal, no murmur, click, rub or gallop Lung:chest clear, no wheezing, rales, no dullness to percussion.  Abdomin: soft, non-tender, without masses or organomegaly no rebound or guarding. EXT: No edema noted.   Lab Results: Lab Results  Component Value Date   WBC 12.2 (H) 03/27/2016   HGB 12.5 (L) 03/27/2016   HCT 38.7 03/27/2016   MCV 84.5 03/27/2016   PLT 276 03/27/2016     Chemistry      Component Value Date/Time   NA 136 12/25/2015 0935   K 5.5 No visable hemolysis (H) 12/25/2015 0935   CL 102 10/17/2013 1706   CO2 20 (L) 12/25/2015 0935   BUN 26.4 (H) 12/25/2015 0935   CREATININE 1.4 (H) 12/25/2015 0935      Component Value Date/Time   CALCIUM 9.6 12/25/2015 0935   ALKPHOS 76 12/25/2015 0935   AST 12 12/25/2015 0935   ALT 13 12/25/2015 0935   BILITOT <0.30 12/25/2015 0935       Impression and Plan:  60 year old gentleman with the following issues:  1. Renal cell carcinoma diagnosed with a left renal mass in July of 2013 and after a nephrectomy developed peritoneal and retroperitoneal metastasis. He was started on Votrient in May of 2015 but was discontinued due to poor tolerance.   He has been on observation and surveillance since that time. PET CT scan obtained on 12/25/2015 showed that his disease has progressed but not rapidly without any organ compromise.   His laboratory data and clinical status remain stable without any need for intervention. The plan is to continue with supportive measures at this time based on his wishes.  2. Fatigue and functional decline: Likely related to his malignancy. No major changes since his last visit.  3. Abdominal pain: Related to mild progression of his  peritoneal metastasis. Manageable with Ultram which she takes very infrequently. He does not need any stronger pain medication at this time.  4. Prognosis: Clearly he has an incurable malignancy that will continue to progress and ultimately lead to his death. I have offered him hospice care previously and we will consider it as needed in the future.  5. Followup: In December 2017.  Paticia Moster, MD 8/17/201710:10 AM

## 2016-03-27 NOTE — Telephone Encounter (Signed)
Gave patient/relative ave report and appointment for December

## 2016-04-09 ENCOUNTER — Other Ambulatory Visit: Payer: Self-pay | Admitting: Family Medicine

## 2016-04-11 NOTE — Telephone Encounter (Signed)
Refill for one year 

## 2016-05-22 ENCOUNTER — Ambulatory Visit: Payer: PPO | Admitting: Family Medicine

## 2016-06-18 ENCOUNTER — Encounter: Payer: Self-pay | Admitting: Family Medicine

## 2016-06-18 ENCOUNTER — Ambulatory Visit (INDEPENDENT_AMBULATORY_CARE_PROVIDER_SITE_OTHER): Payer: PPO | Admitting: Family Medicine

## 2016-06-18 VITALS — BP 110/80 | HR 76 | Temp 97.8°F

## 2016-06-18 DIAGNOSIS — C649 Malignant neoplasm of unspecified kidney, except renal pelvis: Secondary | ICD-10-CM

## 2016-06-18 DIAGNOSIS — E119 Type 2 diabetes mellitus without complications: Secondary | ICD-10-CM

## 2016-06-18 DIAGNOSIS — R7989 Other specified abnormal findings of blood chemistry: Secondary | ICD-10-CM

## 2016-06-18 DIAGNOSIS — E349 Endocrine disorder, unspecified: Secondary | ICD-10-CM

## 2016-06-18 DIAGNOSIS — Z23 Encounter for immunization: Secondary | ICD-10-CM

## 2016-06-18 DIAGNOSIS — I10 Essential (primary) hypertension: Secondary | ICD-10-CM

## 2016-06-18 DIAGNOSIS — E785 Hyperlipidemia, unspecified: Secondary | ICD-10-CM

## 2016-06-18 NOTE — Progress Notes (Signed)
Subjective:     Patient ID: Austin [name], male   DOB: 1955/11/04, 60 y.o.   MRN: [id number]  HPI Patient seen for medical follow-up.  He has history of hypertension, pituitary tumor, type 2 diabetes, metastatic renal cell carcinoma, history of brainstem CVA, hyperlipidemia, low testosterone, neurogenic bladder with history of recurrent UTI  He is doing well with trimethoprim prophylaxis for UTIs over the past couple months.  Type 2 diabetes. History of good control. Not monitoring blood sugars regularly. He does in an out caths.  No recent fevers or chills In general, has had some decreased appetite over the past several months and increased general weakness. They've decided against any further chemotherapy regarding his renal cell carcinoma. He's had some occasional upper back pain which they think may be related to increased general weakness.  Hypertension treated with lisinopril HCTZ. No dizziness. No chest pains. He has history of some recurrent depression and anxiety which is stable on Lexapro. Hyperlipidemia treated with atorvastatin. No myalgias. Also maintained on Plavix.  Very infrequent use of tramadol Needs flu vaccine.   Past Medical History:  Diagnosis Date  . Cerebrovascular accident ([place]) 1994   hx of brainstem stroke, residual diminished lung capacity  . Chronic kidney disease    Left Renal Mass  . Diabetes mellitus without complication ([place])   . Hx of colonic polyps   . Hypertension   . Hypogonadism   . 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 Pituitary adenoma ([place])   . 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 Sleep apnea    stopbang=4  . Urinary incontinence   . Venous stasis    edema   Past Surgical History:  Procedure Laterality Date  . CHOLECYSTECTOMY    . COLONOSCOPY  02/27/2012   Procedure: COLONOSCOPY;  Surgeon: Jerene Bears, MD;   [facility];  Service: Gastroenterology;  Laterality: N/A;  . gamma knife radiation surgery  05/2010   for pituitary  . PITUITARY SURGERY  2007, 2011  . ROBOT ASSISTED LAPAROSCOPIC NEPHRECTOMY  04/23/2012   Procedure: ROBOTIC ASSISTED LAPAROSCOPIC NEPHRECTOMY;  Surgeon: Alexis Frock, MD;  Location: [facility];  Service: Urology;  Laterality: Left;  radical  . Robotic Partial Nephrectomy  04-23-12   left   Left Renal Mass  . stomach peg  02/1993   removed 5-6 years later  . TRACHEOSTOMY TUBE PLACEMENT  02/1993   removed 04/1993  . UMBILICAL HERNIA REPAIR  04/23/2012   Procedure: HERNIA REPAIR UMBILICAL ADULT;  Surgeon: Alexis Frock, MD;  Location: [facility];  Service: Urology;;  . [name]  . vocal cord surgery     injected with collagen, then fat from stomach to improve speech s/p stroke    reports that he quit smoking about 23 years ago. His smoking use included Cigarettes. He has a 11.50 pack-year smoking history. He has never used smokeless tobacco. He reports that he does not drink alcohol or use drugs. family history includes Alcohol abuse in his father and father; Arthritis in his maternal grandmother and mother; Colon cancer in his brother; Esophageal cancer (age of onset: 65) in his father; Hyperlipidemia in his mother; Hypertension in his brother; Stroke in his paternal grandmother; Throat cancer in his father; Uterine cancer (age of onset: 42) in his mother. Allergies  Allergen Reactions  . Penicillins Rash     Review  of Systems  Constitutional: Positive for appetite change and fatigue. Negative for chills and fever.  Eyes: Negative for visual disturbance.  Respiratory: Negative for cough, chest tightness and shortness of breath.   Cardiovascular: Negative for chest pain, palpitations and leg swelling.  Gastrointestinal: Negative for abdominal pain.  Neurological: Negative for dizziness, syncope, weakness, light-headedness and headaches.  Psychiatric/Behavioral:  Negative for dysphoric mood and sleep disturbance.       Objective:   Physical Exam  Constitutional: He appears well-developed and well-nourished.  HENT:  Mouth/Throat: Oropharynx is clear and moist.  Neck: Neck supple.  Cardiovascular: Normal rate and regular rhythm.   Pulmonary/Chest: Effort normal and breath sounds normal. No respiratory distress. He has no wheezes. He has no rales.  Musculoskeletal: He exhibits no edema.  Lymphadenopathy:    He has no cervical adenopathy.  Neurological: He is alert.       Assessment:     #1 type 2 diabetes with history of good control  #2 hypertension stable and at goal  #3 low testosterone on intramuscular replacement  #4 history of recurrent depression/anxiety stable  #5 metastatic renal cell carcinoma with patient deciding against further chemotherapy  #6 hyperlipidemia    Plan:     -Flu vaccine given. -We planned hemoglobin A1c today but they were in a hurry and could not wait. They'll return in 3 months for that -Discussed healthy options for calorie supplementation -Avoid regular use of tramadol with SSRI chronic use (Lexapro)  [name] MD [facility] at [facility]

## 2016-07-09 ENCOUNTER — Telehealth (INDEPENDENT_AMBULATORY_CARE_PROVIDER_SITE_OTHER): Payer: PPO | Admitting: Family Medicine

## 2016-07-09 ENCOUNTER — Other Ambulatory Visit (INDEPENDENT_AMBULATORY_CARE_PROVIDER_SITE_OTHER): Payer: PPO

## 2016-07-09 DIAGNOSIS — R8290 Unspecified abnormal findings in urine: Secondary | ICD-10-CM

## 2016-07-09 LAB — POCT URINALYSIS DIPSTICK
Bilirubin, UA: NEGATIVE
Glucose, UA: NEGATIVE
KETONES UA: NEGATIVE
Nitrite, UA: POSITIVE
SPEC GRAV UA: 1.01
Urobilinogen, UA: 0.2
pH, UA: 5

## 2016-07-09 MED ORDER — CIPROFLOXACIN HCL 500 MG PO TABS: 500.0000 mg | ORAL_TABLET | Freq: Two times a day (BID) | ORAL | 0 refills | Status: DC

## 2016-07-09 NOTE — Telephone Encounter (Signed)
Send urine culture and start Cipro 500 mg twice a day for 7 days

## 2016-07-09 NOTE — Telephone Encounter (Signed)
Wife is aware to drop off sample. Please advise on medication to start if necessary.

## 2016-07-09 NOTE — Telephone Encounter (Signed)
Pt wife thinks her husband has another bladder infection and would like to drop off urine sample. Please advise

## 2016-07-09 NOTE — Addendum Note (Signed)
Addended by: Elio Forget on: 07/09/2016 04:36 PM   Modules accepted: Orders

## 2016-07-09 NOTE — Telephone Encounter (Signed)
UA results are in now. Would you like a culture?

## 2016-07-09 NOTE — Telephone Encounter (Signed)
I don't see urine dip results yet.

## 2016-07-09 NOTE — Telephone Encounter (Signed)
Wife is aware that script has been sent to pharmacy.

## 2016-07-11 ENCOUNTER — Other Ambulatory Visit (HOSPITAL_BASED_OUTPATIENT_CLINIC_OR_DEPARTMENT_OTHER): Payer: PPO

## 2016-07-11 ENCOUNTER — Ambulatory Visit (HOSPITAL_BASED_OUTPATIENT_CLINIC_OR_DEPARTMENT_OTHER): Payer: PPO | Admitting: Oncology

## 2016-07-11 VITALS — BP 100/43 | HR 66 | Temp 97.5°F | Resp 17 | Ht 72.0 in

## 2016-07-11 DIAGNOSIS — C642 Malignant neoplasm of left kidney, except renal pelvis: Secondary | ICD-10-CM | POA: Diagnosis not present

## 2016-07-11 DIAGNOSIS — C7989 Secondary malignant neoplasm of other specified sites: Secondary | ICD-10-CM | POA: Diagnosis not present

## 2016-07-11 DIAGNOSIS — I10 Essential (primary) hypertension: Secondary | ICD-10-CM

## 2016-07-11 DIAGNOSIS — C786 Secondary malignant neoplasm of retroperitoneum and peritoneum: Secondary | ICD-10-CM | POA: Diagnosis not present

## 2016-07-11 DIAGNOSIS — C649 Malignant neoplasm of unspecified kidney, except renal pelvis: Secondary | ICD-10-CM

## 2016-07-11 DIAGNOSIS — R5383 Other fatigue: Secondary | ICD-10-CM

## 2016-07-11 LAB — CBC WITH DIFFERENTIAL/PLATELET
BASO%: 1.1 % (ref 0.0–2.0)
Basophils Absolute: 0.1 10*3/uL (ref 0.0–0.1)
EOS%: 4 % (ref 0.0–7.0)
Eosinophils Absolute: 0.5 10*3/uL (ref 0.0–0.5)
HCT: 40.1 % (ref 38.4–49.9)
HEMOGLOBIN: 12.8 g/dL — AB (ref 13.0–17.1)
LYMPH%: 7.4 % — ABNORMAL LOW (ref 14.0–49.0)
MCH: 27.7 pg (ref 27.2–33.4)
MCHC: 31.8 g/dL — ABNORMAL LOW (ref 32.0–36.0)
MCV: 87.3 fL (ref 79.3–98.0)
MONO#: 1 10*3/uL — ABNORMAL HIGH (ref 0.1–0.9)
MONO%: 8.4 % (ref 0.0–14.0)
NEUT%: 79.1 % — ABNORMAL HIGH (ref 39.0–75.0)
NEUTROS ABS: 9.5 10*3/uL — AB (ref 1.5–6.5)
Platelets: 258 10*3/uL (ref 140–400)
RBC: 4.6 10*6/uL (ref 4.20–5.82)
RDW: 16.9 % — AB (ref 11.0–14.6)
WBC: 12.1 10*3/uL — AB (ref 4.0–10.3)
lymph#: 0.9 10*3/uL (ref 0.9–3.3)

## 2016-07-11 LAB — COMPREHENSIVE METABOLIC PANEL
ALBUMIN: 3 g/dL — AB (ref 3.5–5.0)
ALK PHOS: 103 U/L (ref 40–150)
ALT: 25 U/L (ref 0–55)
AST: 16 U/L (ref 5–34)
Anion Gap: 10 mEq/L (ref 3–11)
BUN: 32.8 mg/dL — AB (ref 7.0–26.0)
CO2: 16 meq/L — AB (ref 22–29)
Calcium: 9.3 mg/dL (ref 8.4–10.4)
Chloride: 110 mEq/L — ABNORMAL HIGH (ref 98–109)
Creatinine: 1.8 mg/dL — ABNORMAL HIGH (ref 0.7–1.3)
EGFR: 41 mL/min/{1.73_m2} — ABNORMAL LOW (ref >90)
GLUCOSE: 92 mg/dL (ref 70–140)
POTASSIUM: 5.7 meq/L — AB (ref 3.5–5.1)
SODIUM: 136 meq/L (ref 136–145)
TOTAL PROTEIN: 7.7 g/dL (ref 6.4–8.3)
Total Bilirubin: 0.24 mg/dL (ref 0.20–1.20)

## 2016-07-11 NOTE — Progress Notes (Signed)
Hematology and Oncology Follow Up Visit  Austin [name] [id number] 04/08/1956 60 y.o. 07/11/2016 9:38 AM Austin [name], MDBurchette, [name], MD   Principle Diagnosis: 60 year old man with a renal cell carcinoma diagnosed in 2013 he presented with a 4.2 cm mass in the lower pole of the left kidney. Now he has stage IV disease. He has peritoneal involvement as well as splenic metastasis. 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 He is status [name] robotic-assisted laparoscopic nephrectomy on the left done on 04/23/2012. The pathology revealed renal cell carcinoma with a grade 3/4 with the pathological staging of T3a.   2. Votrient 800 mg daily started around 12/27/2013. This was reduced to 400 mg subsequently and have been discontinued since 02/03/2014 due to poor tolerance.  Current therapy: Observation and surveillance.  Interim History: Austin [name] presents today for a followup visit with his wife. Since his last visit, he reports no new complaints. His by mouth intake still marginal and potentially have lost more weight. He reports his pain level is unchanged and continues to be manageable with ultram.  He takes Ultram once a day and rarely more than that. He does report some mild fatigue which is not dramatically changed from previously. He does not report any dizziness or lightheadedness. Has not reported any fall or syncope.   He had any headaches, blurry vision, or seizures. He does not report any fevers or chills or sweats. Has not reported chest pain or difficulty breathing. Has not reported any nausea, vomiting or early satiety. Has not reported any hematochezia or melena. He has not reported any arthralgias or myalgias. As that report any lymphadenopathy or petechiae. Is not reporting any bleeding complications. Rest of review of systems unremarkable.  Medications: I have reviewed the patient's current medications.  Current  Outpatient Prescriptions  Medication Sig Dispense Refill  . albuterol (PROVENTIL HFA;VENTOLIN HFA) 108 (90 BASE) MCG/ACT inhaler Inhale 2 puffs into the lungs every 6 (six) hours as needed for wheezing or shortness of breath. 1 Inhaler 0  . atorvastatin (LIPITOR) 20 MG tablet TAKE ONE TABLET BY MOUTH ONCE DAILY IN THE MORNING 90 tablet 1  . ciprofloxacin (CIPRO) 500 MG tablet Take 1 tablet (500 mg total) by mouth 2 (two) times daily. 14 tablet 0  . clopidogrel (PLAVIX) 75 MG tablet TAKE ONE TABLET BY MOUTH AT BEDTIME 90 tablet 1  . CRANBERRY PO Take 1 tablet by mouth 2 (two) times daily.    Marland Kitchen escitalopram (LEXAPRO) 10 MG tablet TAKE ONE TABLET BY MOUTH ONCE DAILY IN THE MORNING. 90 tablet 1  . ketoconazole (NIZORAL) 2 % cream APPLY AS NEEDED TO RASH 60 g 1  . lisinopril-hydrochlorothiazide (PRINZIDE,ZESTORETIC) 20-12.5 MG tablet TAKE ONE TABLET BY MOUTH TWICE DAILY 180 tablet 2  . metFORMIN (GLUCOPHAGE) 500 MG tablet TAKE ONE TABLET BY MOUTH TWICE DAILY WITH A MEAL 180 tablet 2  . Multiple Vitamin (MULTIVITAMIN WITH MINERALS) TABS tablet Take 1 tablet by mouth daily.    Marland Kitchen testosterone enanthate (DELATESTRYL) 200 MG/ML injection INJECT 1 ML INTRAMUSCULARLY EVERY 2 WEEKS 15 mL 5  . traMADol (ULTRAM) 50 MG tablet Take 1 tablet (50 mg total) by mouth every 6 (six) hours as needed. 60 tablet 0  . traZODone (DESYREL) 100 MG tablet TAKE ONE TABLET BY MOUTH AT BEDTIME 90 tablet 1  . triamcinolone cream (KENALOG) 0.1 % APPLYTO AFFECTED AREA TWICE DAILY AS NEEDED 15 g 2  .  trimethoprim (TRIMPEX) 100 MG tablet Take 1 tablet (100 mg total) by mouth daily. 30 tablet 5   No current facility-administered medications for this visit.      Allergies:  Allergies  Allergen Reactions  . Penicillins Rash    Past Medical History, Surgical history, Social history, and Family History were reviewed and updated.    Physical Exam: Blood pressure (!) 100/43, pulse 66, temperature 97.5 F (36.4 C), temperature  source Oral, resp. rate 17, height 6' (1.829 m), SpO2 100 %. ECOG: 2 General appearance: Alert, awake gentleman appeared without distress. Head: Normocephalic, without obvious abnormality. No oral ulcers or lesions. Neck: no adenopathy or thyroid masses. Lymph nodes: Cervical, supraclavicular, and axillary nodes normal. Heart:regular rate and rhythm, S1, S2 normal, no murmur, click, rub or gallop Lung:chest clear, no wheezing, rales, no dullness to percussion.  Abdomin: soft, non-tender, without masses or organomegaly no shifting dullness or ascites.. EXT: No edema noted.   Lab Results: Lab Results  Component Value Date   WBC 12.1 (H) 07/11/2016   HGB 12.8 (L) 07/11/2016   HCT 40.1 07/11/2016   MCV 87.3 07/11/2016   PLT 258 07/11/2016     Chemistry      Component Value Date/Time   NA 136 03/27/2016 0933   K 5.3 (H) 03/27/2016 0933   CL 102 10/17/2013 1706   CO2 18 (L) 03/27/2016 0933   BUN 31.0 (H) 03/27/2016 0933   CREATININE 1.6 (H) 03/27/2016 0933      Component Value Date/Time   CALCIUM 10.1 03/27/2016 0933   ALKPHOS 92 03/27/2016 0933   AST 19 03/27/2016 0933   ALT 32 03/27/2016 0933   BILITOT <0.30 03/27/2016 0933       Impression and Plan:  60 year old gentleman with the following issues:  1. Renal cell carcinoma diagnosed with a left renal mass in July of 2013 and after a nephrectomy developed peritoneal and retroperitoneal metastasis. He was started on Votrient in May of 2015 but was discontinued due to poor tolerance.   He has been on observation and surveillance since that time. PET CT scan obtained on 12/25/2015 showed that his disease has progressed but not rapidly without any organ compromise.   He continues to decline systemic therapy at this time and the plan is to continue with periodic monitoring. We will repeat imaging studies in March 2018 for surveillance purposes.  2. Fatigue and functional decline: Likely related to his malignancy. No major  changes since his last visit. Could be related to hypotension and adjusting his blood pressure medication might improve his symptoms.  3. Abdominal pain: Related to mild progression of his peritoneal metastasis. Manageable with Ultram which she takes very infrequently. He does not need any stronger pain medication at this time.  4. Hypertension: Likely related to weight loss and he might not need hypertension medication. I have instructed him to contact his primary care physician to adjust his blood pressure medication unlikely discontinue it altogether.  5. Prognosis: He has an incurable malignancy that is progressing rather slowly at this time. He will likely require hospice enrollment. Develops rapid progression of disease.  6. Followup: In March 2018.  LKGMWN,UUVOZ, MD 12/1/20179:38 AM

## 2016-07-12 LAB — URINE CULTURE

## 2016-07-21 ENCOUNTER — Telehealth: Payer: Self-pay | Admitting: Family Medicine

## 2016-07-21 ENCOUNTER — Other Ambulatory Visit: Payer: Self-pay | Admitting: Oncology

## 2016-07-21 NOTE — Telephone Encounter (Signed)
done

## 2016-07-21 NOTE — Telephone Encounter (Signed)
Can you write this on a prescription pad? Thanks.

## 2016-07-21 NOTE — Telephone Encounter (Signed)
Information faxed

## 2016-07-21 NOTE — Telephone Encounter (Signed)
Pt's spouse called in stating that an Rx is needed for pt to get his wheelchair repaired.  Can fax to [facility] [phone].

## 2016-07-23 ENCOUNTER — Other Ambulatory Visit: Payer: Self-pay | Admitting: Oncology

## 2016-07-24 ENCOUNTER — Other Ambulatory Visit: Payer: Self-pay | Admitting: *Deleted

## 2016-07-24 ENCOUNTER — Other Ambulatory Visit: Payer: Self-pay | Admitting: Oncology

## 2016-07-24 ENCOUNTER — Telehealth: Payer: Self-pay | Admitting: *Deleted

## 2016-07-24 MED ORDER — TRAMADOL HCL 50 MG PO TABS: 50.0000 mg | ORAL_TABLET | Freq: Four times a day (QID) | ORAL | 0 refills | Status: DC | PRN

## 2016-07-24 NOTE — Telephone Encounter (Signed)
Script for tramadol called in to [facility]. Wife gloria notified.

## 2016-08-18 ENCOUNTER — Other Ambulatory Visit: Payer: Self-pay | Admitting: Family Medicine

## 2016-08-20 ENCOUNTER — Other Ambulatory Visit: Payer: Self-pay | Admitting: Family Medicine

## 2016-08-23 ENCOUNTER — Other Ambulatory Visit: Payer: Self-pay | Admitting: Family Medicine

## 2016-08-27 ENCOUNTER — Other Ambulatory Visit: Payer: Self-pay | Admitting: Family Medicine

## 2016-08-27 ENCOUNTER — Ambulatory Visit: Payer: PPO | Admitting: Family Medicine

## 2016-08-29 ENCOUNTER — Ambulatory Visit (INDEPENDENT_AMBULATORY_CARE_PROVIDER_SITE_OTHER): Payer: PPO | Admitting: Family Medicine

## 2016-08-29 ENCOUNTER — Encounter: Payer: Self-pay | Admitting: Family Medicine

## 2016-08-29 VITALS — BP 140/80 | HR 77 | Temp 97.7°F

## 2016-08-29 DIAGNOSIS — E785 Hyperlipidemia, unspecified: Secondary | ICD-10-CM

## 2016-08-29 DIAGNOSIS — I10 Essential (primary) hypertension: Secondary | ICD-10-CM | POA: Diagnosis not present

## 2016-08-29 DIAGNOSIS — E119 Type 2 diabetes mellitus without complications: Secondary | ICD-10-CM | POA: Diagnosis not present

## 2016-08-29 DIAGNOSIS — C649 Malignant neoplasm of unspecified kidney, except renal pelvis: Secondary | ICD-10-CM | POA: Diagnosis not present

## 2016-08-29 LAB — POCT GLYCOSYLATED HEMOGLOBIN (HGB A1C): Hemoglobin A1C: 6

## 2016-08-29 NOTE — Patient Instructions (Signed)
Reduce the Metformin to once daily.

## 2016-08-29 NOTE — Progress Notes (Signed)
Subjective:     Patient ID: Austin [name], male   DOB: 1956/06/07, 61 y.o.   MRN: [id number]  HPI Patient seen for routine medical follow-up. He has metastatic renal cell carcinoma, type 2 diabetes, hypertension, history of pituitary tumor, low testosterone, neurogenic bladder, history of recurrent UTI, history of brainstem CVA. He's had some gradual progressive decline. He was treated initially with Votrient for his renal cell carcinoma but could not tolerate secondary side effects. He is currently just being monitored. He has known history of metastases- peritoneal and retroperitoneal. He's having some increased shoulder and abdominal wall pain likely related to metastases. He takes tramadol usually only one per day.  Blood sugar not monitored regularly. A1c has been well controlled. We've been unable to weigh him but he is likely losing some weight. His appetite is somewhat diminished.  He has dyslipidemia and remains on Lipitor.  Past Medical History:  Diagnosis Date  . Cerebrovascular accident ([place]) 1994   hx of brainstem stroke, residual diminished lung capacity  . Chronic kidney disease    Left Renal Mass  . Diabetes mellitus without complication ([place])   . Hx of colonic polyps   . Hypertension   . Hypogonadism   . 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 Pituitary adenoma ([place])   . 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 Sleep apnea    stopbang=4  . Urinary incontinence   . Venous stasis    edema   Past Surgical History:  Procedure Laterality Date  . CHOLECYSTECTOMY    . COLONOSCOPY  02/27/2012   Procedure: COLONOSCOPY;  Surgeon: Jerene Bears, MD;  [facility];  Service: Gastroenterology;  Laterality: N/A;  . gamma knife radiation surgery  05/2010   for pituitary  . PITUITARY SURGERY  2007, 2011  . ROBOT ASSISTED LAPAROSCOPIC NEPHRECTOMY   04/23/2012   Procedure: ROBOTIC ASSISTED LAPAROSCOPIC NEPHRECTOMY;  Surgeon: Alexis Frock, MD;  [facility];  Service: Urology;  Laterality: Left;  radical  . Robotic Partial Nephrectomy  04-23-12   left   Left Renal Mass  . stomach peg  02/1993   removed 5-6 years later  . TRACHEOSTOMY TUBE PLACEMENT  02/1993   removed 04/1993  . UMBILICAL HERNIA REPAIR  04/23/2012   Procedure: HERNIA REPAIR UMBILICAL ADULT;  Surgeon: Alexis Frock, MD;  [facility];  Service: Urology;;  . [name]  . vocal cord surgery     injected with collagen, then fat from stomach to improve speech s/p stroke    reports that he quit smoking about 23 years ago. His smoking use included Cigarettes. He has a 11.50 pack-year smoking history. He has never used smokeless tobacco. He reports that he does not drink alcohol or use drugs. family history includes Alcohol abuse in his father and father; Arthritis in his maternal grandmother and mother; Colon cancer in his brother; Esophageal cancer (age of onset: 26) in his father; Hyperlipidemia in his mother; Hypertension in his brother; Stroke in his paternal grandmother; Throat cancer in his father; Uterine cancer (age of onset: 104) in his mother. Allergies  Allergen Reactions  . Penicillins Rash     Review of Systems  Constitutional: Positive for fatigue.  Eyes: Negative for visual disturbance.  Respiratory: Negative for cough, chest tightness and shortness of breath.   Cardiovascular: Negative for chest pain, palpitations and leg  swelling.  Neurological: Negative for dizziness, syncope, weakness, light-headedness and headaches.       Objective:   Physical Exam  Constitutional: He appears well-developed and well-nourished. No distress.  Cardiovascular: Normal rate and regular rhythm.   Pulmonary/Chest: Effort normal and breath sounds normal. No respiratory distress. He has no wheezes. He has no rales.  Musculoskeletal: He exhibits no edema.   Neurological: He is alert.       Assessment:     #1 type 2 diabetes. History of good control. We are not aiming for tight control because of his metastatic renal cell carcinoma  #2 history of hypertension. Currently stable. Repeat blood pressure today right arm seated 120/70. Might be able to discontinue lisinopril altogether soon especially with weight loss  #3 dyslipidemia on Lipitor  #4 metastatic renal cell carcinoma. Patient has elected not to pursue further treatments. Being monitored per oncology    Plan:     -We discussed long-term goals of care. Will likely be hospice candidate eventually in the near future -We discussed reducing medications over time especially as his appetite and intake diminish -We discussed pain control. He is encouraged if he needs to take more than 1 tramadol per day for better pain control. -Recheck hemoglobin A1c  [name] MD [facility] at [facility]

## 2016-08-29 NOTE — Progress Notes (Signed)
Pre visit review using our clinic review tool, if applicable. No additional management support is needed unless otherwise documented below in the visit note. 

## 2016-09-04 ENCOUNTER — Telehealth: Payer: Self-pay | Admitting: Family Medicine

## 2016-09-04 NOTE — Telephone Encounter (Signed)
Pt was seen on 1-19 and trying to get  new wheelchair from [facility] . Pt wife would like [facility] to return her call tomorrow

## 2016-09-08 ENCOUNTER — Encounter: Payer: Self-pay | Admitting: Family Medicine

## 2016-09-08 ENCOUNTER — Other Ambulatory Visit: Payer: Self-pay

## 2016-09-08 NOTE — Telephone Encounter (Signed)
Faxed information.

## 2016-09-08 NOTE — Telephone Encounter (Signed)
done

## 2016-09-08 NOTE — Telephone Encounter (Signed)
Pt needs a few RX's for orders. 1. RX for PT/OT with DX code (evaluation for wheel chair 2. RX for the Actually wheel chair with DX code. I will fax both of these along with OV note from last week to [phone] [facility]. Looks like his CVA code needs to be updated in chart otherwise I would have provided it.

## 2016-09-15 ENCOUNTER — Telehealth: Payer: Self-pay | Admitting: Family Medicine

## 2016-09-15 DIAGNOSIS — R8290 Unspecified abnormal findings in urine: Secondary | ICD-10-CM

## 2016-09-15 NOTE — Telephone Encounter (Signed)
Wife states pt has another UTI. Would like to know if OK to bring in a specimen?

## 2016-09-15 NOTE — Telephone Encounter (Signed)
OK and send culture.

## 2016-09-15 NOTE — Telephone Encounter (Signed)
Wife is aware OK to bring in urine. Orders entered. She will bring in the specimen in the morning. FYI.

## 2016-09-16 ENCOUNTER — Other Ambulatory Visit: Payer: Self-pay

## 2016-09-16 ENCOUNTER — Other Ambulatory Visit (INDEPENDENT_AMBULATORY_CARE_PROVIDER_SITE_OTHER): Payer: PPO

## 2016-09-16 DIAGNOSIS — R8290 Unspecified abnormal findings in urine: Secondary | ICD-10-CM | POA: Diagnosis not present

## 2016-09-16 LAB — POC URINALSYSI DIPSTICK (AUTOMATED)
Bilirubin, UA: NEGATIVE
Glucose, UA: NEGATIVE
KETONES UA: NEGATIVE
Nitrite, UA: POSITIVE
PH UA: 5.5
SPEC GRAV UA: 1.02
UROBILINOGEN UA: 0.2

## 2016-09-16 MED ORDER — CIPROFLOXACIN HCL 500 MG PO TABS: 500.0000 mg | ORAL_TABLET | Freq: Two times a day (BID) | ORAL | 0 refills | Status: DC

## 2016-09-19 ENCOUNTER — Ambulatory Visit: Payer: PPO | Admitting: Family Medicine

## 2016-09-19 LAB — URINE CULTURE

## 2016-09-21 ENCOUNTER — Other Ambulatory Visit: Payer: Self-pay | Admitting: Family Medicine

## 2016-09-27 ENCOUNTER — Emergency Department (HOSPITAL_COMMUNITY): Payer: PPO

## 2016-09-27 ENCOUNTER — Inpatient Hospital Stay (HOSPITAL_COMMUNITY)
Admission: EM | Admit: 2016-09-27 | Discharge: 2016-10-09 | DRG: 391 | Disposition: A | Discharge status: Home / Self Care | Payer: PPO | Attending: General Surgery | Admitting: General Surgery

## 2016-09-27 ENCOUNTER — Encounter (HOSPITAL_COMMUNITY): Payer: Self-pay | Admitting: Emergency Medicine

## 2016-09-27 DIAGNOSIS — Z85528 Personal history of other malignant neoplasm of kidney: Secondary | ICD-10-CM

## 2016-09-27 DIAGNOSIS — D352 Benign neoplasm of pituitary gland: Secondary | ICD-10-CM | POA: Diagnosis present

## 2016-09-27 DIAGNOSIS — B9689 Other specified bacterial agents as the cause of diseases classified elsewhere: Secondary | ICD-10-CM | POA: Diagnosis not present

## 2016-09-27 DIAGNOSIS — I313 Pericardial effusion (noninflammatory): Secondary | ICD-10-CM | POA: Diagnosis present

## 2016-09-27 DIAGNOSIS — G4730 Sleep apnea, unspecified: Secondary | ICD-10-CM | POA: Diagnosis present

## 2016-09-27 DIAGNOSIS — R188 Other ascites: Secondary | ICD-10-CM | POA: Diagnosis not present

## 2016-09-27 DIAGNOSIS — A419 Sepsis, unspecified organism: Secondary | ICD-10-CM | POA: Diagnosis not present

## 2016-09-27 DIAGNOSIS — Z7902 Long term (current) use of antithrombotics/antiplatelets: Secondary | ICD-10-CM

## 2016-09-27 DIAGNOSIS — Z87891 Personal history of nicotine dependence: Secondary | ICD-10-CM

## 2016-09-27 DIAGNOSIS — J9811 Atelectasis: Secondary | ICD-10-CM | POA: Diagnosis present

## 2016-09-27 DIAGNOSIS — K5720 Diverticulitis of large intestine with perforation and abscess without bleeding: Principal | ICD-10-CM | POA: Diagnosis present

## 2016-09-27 DIAGNOSIS — I129 Hypertensive chronic kidney disease with stage 1 through stage 4 chronic kidney disease, or unspecified chronic kidney disease: Secondary | ICD-10-CM | POA: Diagnosis present

## 2016-09-27 DIAGNOSIS — C786 Secondary malignant neoplasm of retroperitoneum and peritoneum: Secondary | ICD-10-CM | POA: Diagnosis not present

## 2016-09-27 DIAGNOSIS — R490 Dysphonia: Secondary | ICD-10-CM | POA: Diagnosis not present

## 2016-09-27 DIAGNOSIS — N3090 Cystitis, unspecified without hematuria: Secondary | ICD-10-CM | POA: Diagnosis present

## 2016-09-27 DIAGNOSIS — K5732 Diverticulitis of large intestine without perforation or abscess without bleeding: Secondary | ICD-10-CM | POA: Diagnosis not present

## 2016-09-27 DIAGNOSIS — J90 Pleural effusion, not elsewhere classified: Secondary | ICD-10-CM | POA: Diagnosis not present

## 2016-09-27 DIAGNOSIS — A0472 Enterocolitis due to Clostridium difficile, not specified as recurrent: Secondary | ICD-10-CM

## 2016-09-27 DIAGNOSIS — E1122 Type 2 diabetes mellitus with diabetic chronic kidney disease: Secondary | ICD-10-CM | POA: Diagnosis present

## 2016-09-27 DIAGNOSIS — Z7984 Long term (current) use of oral hypoglycemic drugs: Secondary | ICD-10-CM

## 2016-09-27 DIAGNOSIS — R52 Pain, unspecified: Secondary | ICD-10-CM

## 2016-09-27 DIAGNOSIS — E291 Testicular hypofunction: Secondary | ICD-10-CM | POA: Diagnosis present

## 2016-09-27 DIAGNOSIS — Z800 Family history of malignant neoplasm of digestive organs: Secondary | ICD-10-CM

## 2016-09-27 DIAGNOSIS — R911 Solitary pulmonary nodule: Secondary | ICD-10-CM | POA: Diagnosis present

## 2016-09-27 DIAGNOSIS — Z86010 Personal history of colonic polyps: Secondary | ICD-10-CM

## 2016-09-27 DIAGNOSIS — N189 Chronic kidney disease, unspecified: Secondary | ICD-10-CM | POA: Diagnosis not present

## 2016-09-27 DIAGNOSIS — Z79899 Other long term (current) drug therapy: Secondary | ICD-10-CM

## 2016-09-27 DIAGNOSIS — Z9049 Acquired absence of other specified parts of digestive tract: Secondary | ICD-10-CM | POA: Diagnosis not present

## 2016-09-27 DIAGNOSIS — Z823 Family history of stroke: Secondary | ICD-10-CM

## 2016-09-27 DIAGNOSIS — Z880 Allergy status to penicillin: Secondary | ICD-10-CM

## 2016-09-27 DIAGNOSIS — Z8673 Personal history of transient ischemic attack (TIA), and cerebral infarction without residual deficits: Secondary | ICD-10-CM

## 2016-09-27 DIAGNOSIS — G8250 Quadriplegia, unspecified: Secondary | ICD-10-CM

## 2016-09-27 DIAGNOSIS — K651 Peritoneal abscess: Secondary | ICD-10-CM | POA: Diagnosis not present

## 2016-09-27 DIAGNOSIS — Z8249 Family history of ischemic heart disease and other diseases of the circulatory system: Secondary | ICD-10-CM

## 2016-09-27 DIAGNOSIS — Z808 Family history of malignant neoplasm of other organs or systems: Secondary | ICD-10-CM

## 2016-09-27 DIAGNOSIS — N179 Acute kidney failure, unspecified: Secondary | ICD-10-CM | POA: Diagnosis not present

## 2016-09-27 DIAGNOSIS — N39498 Other specified urinary incontinence: Secondary | ICD-10-CM | POA: Diagnosis not present

## 2016-09-27 DIAGNOSIS — IMO0002 Reserved for concepts with insufficient information to code with codable children: Secondary | ICD-10-CM

## 2016-09-27 DIAGNOSIS — I639 Cerebral infarction, unspecified: Secondary | ICD-10-CM | POA: Diagnosis present

## 2016-09-27 DIAGNOSIS — Z905 Acquired absence of kidney: Secondary | ICD-10-CM

## 2016-09-27 DIAGNOSIS — E119 Type 2 diabetes mellitus without complications: Secondary | ICD-10-CM | POA: Diagnosis not present

## 2016-09-27 DIAGNOSIS — C649 Malignant neoplasm of unspecified kidney, except renal pelvis: Secondary | ICD-10-CM | POA: Diagnosis present

## 2016-09-27 LAB — COMPREHENSIVE METABOLIC PANEL
ALBUMIN: 3.5 g/dL (ref 3.5–5.0)
ALK PHOS: 70 U/L (ref 38–126)
ALT: 27 U/L (ref 17–63)
AST: 20 U/L (ref 15–41)
Anion gap: 9 (ref 5–15)
BUN: 31 mg/dL — ABNORMAL HIGH (ref 6–20)
CALCIUM: 9.2 mg/dL (ref 8.9–10.3)
CO2: 17 mmol/L — AB (ref 22–32)
CREATININE: 1.92 mg/dL — AB (ref 0.61–1.24)
Chloride: 113 mmol/L — ABNORMAL HIGH (ref 101–111)
GFR calc Af Amer: 41 mL/min — ABNORMAL LOW (ref >60)
GFR calc non Af Amer: 36 mL/min — ABNORMAL LOW (ref >60)
GLUCOSE: 119 mg/dL — AB (ref 65–99)
Potassium: 4.8 mmol/L (ref 3.5–5.1)
SODIUM: 139 mmol/L (ref 135–145)
Total Bilirubin: 0.6 mg/dL (ref 0.3–1.2)
Total Protein: 7.9 g/dL (ref 6.5–8.1)

## 2016-09-27 LAB — I-STAT CG4 LACTIC ACID, ED
LACTIC ACID, VENOUS: 1.45 mmol/L (ref 0.5–1.9)
Lactic Acid, Venous: 1.53 mmol/L (ref 0.5–1.9)

## 2016-09-27 LAB — CBC
HCT: 42 % (ref 39.0–52.0)
Hemoglobin: 13.7 g/dL (ref 13.0–17.0)
MCH: 28.1 pg (ref 26.0–34.0)
MCHC: 32.6 g/dL (ref 30.0–36.0)
MCV: 86.2 fL (ref 78.0–100.0)
PLATELETS: 302 10*3/uL (ref 150–400)
RBC: 4.87 MIL/uL (ref 4.22–5.81)
RDW: 16.3 % — AB (ref 11.5–15.5)
WBC: 26.2 10*3/uL — ABNORMAL HIGH (ref 4.0–10.5)

## 2016-09-27 LAB — URINALYSIS, ROUTINE W REFLEX MICROSCOPIC
BILIRUBIN URINE: NEGATIVE
Glucose, UA: NEGATIVE mg/dL
HGB URINE DIPSTICK: NEGATIVE
Ketones, ur: NEGATIVE mg/dL
Leukocytes, UA: NEGATIVE
Nitrite: NEGATIVE
PROTEIN: NEGATIVE mg/dL
Specific Gravity, Urine: 1.017 (ref 1.005–1.030)
pH: 5 (ref 5.0–8.0)

## 2016-09-27 LAB — LIPASE, BLOOD: Lipase: 22 U/L (ref 11–51)

## 2016-09-27 MED ORDER — CIPROFLOXACIN IN D5W 400 MG/200ML IV SOLN
400.0000 mg | Freq: Once | INTRAVENOUS | Status: AC
Start: 2016-09-27 — End: 2016-09-27
  Administered 2016-09-27: 400 mg via INTRAVENOUS
  Filled 2016-09-27: qty 200

## 2016-09-27 MED ORDER — FENTANYL CITRATE (PF) 100 MCG/2ML IJ SOLN
50.0000 ug | Freq: Once | INTRAMUSCULAR | Status: AC
  Administered 2016-09-27: 50 ug via INTRAVENOUS
  Filled 2016-09-27: qty 2

## 2016-09-27 MED ORDER — IOPAMIDOL (ISOVUE-300) INJECTION 61%
INTRAVENOUS | Status: AC
  Filled 2016-09-27: qty 100

## 2016-09-27 MED ORDER — SODIUM CHLORIDE 0.9 % IJ SOLN
INTRAMUSCULAR | Status: AC
  Filled 2016-09-27: qty 50

## 2016-09-27 MED ORDER — IOPAMIDOL (ISOVUE-300) INJECTION 61%
INTRAVENOUS | Status: AC
  Administered 2016-09-27: 30 mL via ORAL
  Filled 2016-09-27: qty 30

## 2016-09-27 MED ORDER — SODIUM CHLORIDE 0.9 % IV BOLUS (SEPSIS)
1000.0000 mL | Freq: Once | INTRAVENOUS | Status: AC
  Administered 2016-09-27: 1000 mL via INTRAVENOUS

## 2016-09-27 MED ORDER — ONDANSETRON HCL 4 MG/2ML IJ SOLN
4.0000 mg | Freq: Once | INTRAMUSCULAR | Status: AC
  Administered 2016-09-27: 4 mg via INTRAVENOUS
  Filled 2016-09-27: qty 2

## 2016-09-27 MED ORDER — METRONIDAZOLE IN NACL 5-0.79 MG/ML-% IV SOLN
500.0000 mg | Freq: Once | INTRAVENOUS | Status: AC
  Administered 2016-09-27: 500 mg via INTRAVENOUS
  Filled 2016-09-27: qty 100

## 2016-09-27 NOTE — ED Provider Notes (Signed)
[facility] DEPT Provider Note   CSN: [id number] Arrival date & time: 09/27/16  1735     History   Chief Complaint Chief Complaint  Patient presents with  . Abdominal Pain    HPI KERMAN PFOST is a 61 y.o. male.  The history is provided by the patient and the spouse.  Abdominal Pain   This is a new problem. The current episode started 12 to 24 hours ago. The problem occurs constantly. The problem has been gradually worsening. The pain is associated with an unknown factor. The pain is located in the LLQ. The pain is moderate. Associated symptoms include fever (100.9 at home.), nausea and vomiting (dry heaves). Pertinent negatives include melena, dysuria, frequency, headaches and myalgias. The symptoms are aggravated by certain positions and palpation. Nothing relieves the symptoms.    Past Medical History:  Diagnosis Date  . Cerebrovascular accident ([place]) 1994   hx of brainstem stroke, residual diminished lung capacity  . Chronic kidney disease    Left Renal Mass  . Diabetes mellitus without complication ([place])   . Hx of colonic polyps   . Hypertension   . Hypogonadism   . 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 Pituitary adenoma ([place])   . 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 Sleep apnea    stopbang=4  . Urinary incontinence   . Venous stasis    edema    Patient Active Problem List   Diagnosis Date Noted  . Fatigue 05/23/2015  . Type 2 diabetes mellitus, controlled ([place]) 06/01/2014  . Hyperlipidemia 10/17/2013  . Renal carcinoma ([place]) 11/09/2012  . Type 2 diabetes mellitus ([place]) 03/31/2012  . Left renal mass 03/11/2012  . Anemia associated with acute blood loss 02/28/2012  . Abdominal pain, other specified site 02/27/2012  . Rectal bleeding 02/27/2012  . Post-polypectomy bleeding 02/27/2012  . Colon cancer screening 02/24/2012  .  Low testosterone 06/21/2010  . WEAKNESS 04/03/2010  . FACIAL WEAKNESS 04/03/2010  . EDEMA 01/15/2009  . Pituitary tumor 01/15/2009  . Essential hypertension 10/16/2008  . Neurogenic bladder 10/16/2008  . OTHER SEBORRHEIC DERMATITIS 10/16/2008  . Brainstem stroke ([place]) 10/16/2008  . COLONIC POLYPS, HX OF 10/16/2008    Past Surgical History:  Procedure Laterality Date  . CHOLECYSTECTOMY    . COLONOSCOPY  02/27/2012   Procedure: COLONOSCOPY;  Surgeon: Jerene Bears, MD;  Location: [facility];  Service: Gastroenterology;  Laterality: N/A;  . gamma knife radiation surgery  05/2010   for pituitary  . PITUITARY SURGERY  2007, 2011  . ROBOT ASSISTED LAPAROSCOPIC NEPHRECTOMY  04/23/2012   Procedure: ROBOTIC ASSISTED LAPAROSCOPIC NEPHRECTOMY;  Surgeon: Alexis Frock, MD;  Location: [facility]S;  Service: Urology;  Laterality: Left;  radical  . Robotic Partial Nephrectomy  04-23-12   left   Left Renal Mass  . stomach peg  02/1993   removed 5-6 years later  . TRACHEOSTOMY TUBE PLACEMENT  02/1993   removed 04/1993  . UMBILICAL HERNIA REPAIR  04/23/2012   Procedure: HERNIA REPAIR UMBILICAL ADULT;  Surgeon: Alexis Frock, MD;  Location: [facility];  Service: Urology;;  . [name]  . vocal cord surgery     injected with collagen, then fat from stomach to improve speech s/p stroke       Home Medications    Prior to Admission medications   Medication Sig Start Date End  Date Taking? Authorizing Provider  albuterol (PROVENTIL HFA;VENTOLIN HFA) 108 (90 BASE) MCG/ACT inhaler Inhale 2 puffs into the lungs every 6 (six) hours as needed for wheezing or shortness of breath. 07/07/15  Yes Debbrah Alar, NP  atorvastatin (LIPITOR) 20 MG tablet Take 20 mg by mouth daily.   Yes Historical Provider, MD  clopidogrel (PLAVIX) 75 MG tablet Take 75 mg by mouth at bedtime.   Yes Historical Provider, MD  escitalopram (LEXAPRO) 10 MG tablet Take 10 mg by mouth daily.   Yes Historical Provider, MD    ketoconazole (NIZORAL) 2 % cream Apply 1 application topically 2 (two) times daily as needed for irritation.   Yes Historical Provider, MD  lisinopril-hydrochlorothiazide (PRINZIDE,ZESTORETIC) 20-12.5 MG tablet Take 1 tablet by mouth daily.   Yes Historical Provider, MD  metFORMIN (GLUCOPHAGE) 500 MG tablet Take 500 mg by mouth at bedtime.    Yes Historical Provider, MD  Multiple Vitamin (MULTIVITAMIN WITH MINERALS) TABS tablet Take 1 tablet by mouth daily.   Yes Historical Provider, MD  testosterone enanthate (DELATESTRYL) 200 MG/ML injection Inject 200 mg into the muscle every 14 (fourteen) days. For IM use only   Yes Historical Provider, MD  traMADol (ULTRAM) 50 MG tablet Take 50 mg by mouth every 6 (six) hours as needed for moderate pain.   Yes Historical Provider, MD  traZODone (DESYREL) 100 MG tablet Take 100 mg by mouth at bedtime as needed for sleep.   Yes Historical Provider, MD  triamcinolone cream (KENALOG) 0.1 % Apply 1 application topically 2 (two) times daily as needed (for rash).   Yes Historical Provider, MD  trimethoprim (TRIMPEX) 100 MG tablet Take 100 mg by mouth daily.   Yes Historical Provider, MD    Family History Family History  Problem Relation Age of Onset  . Alcohol abuse Father   . Throat cancer Father   . Esophageal cancer Father 35  . Arthritis Mother   . Hyperlipidemia Mother   . Uterine cancer Mother 75  . Colon cancer Brother   . Arthritis Maternal Grandmother   . Hypertension Brother   . Stroke Paternal Grandmother     Social History Social History  Substance Use Topics  . Smoking status: Former Smoker    Packs/day: 0.50    Years: 23.00    Types: Cigarettes    Quit date: 09/24/1992  . Smokeless tobacco: Never Used  . Alcohol use No     Comment: rarley     Allergies   Penicillins   Review of Systems Review of Systems  Constitutional: Positive for fever (100.9 at home.).  Gastrointestinal: Positive for abdominal pain, nausea and vomiting  (dry heaves). Negative for melena.  Genitourinary: Negative for dysuria and frequency.  Musculoskeletal: Negative for myalgias.  Neurological: Negative for headaches.  Ten systems are reviewed and are negative for acute change except as noted in the HPI    Physical Exam Updated Vital Signs BP 116/70   Pulse 105   Temp 101.4 F (38.6 C) (Rectal)   Resp 21   Ht 6' (1.829 m)   SpO2 94%   Physical Exam  Constitutional: He is oriented to person, place, and time. He appears well-developed and well-nourished. No distress.  HENT:  Head: Normocephalic and atraumatic.  Nose: Nose normal.  Eyes: Conjunctivae and EOM are normal. Pupils are equal, round, and reactive to light. Right eye exhibits no discharge. Left eye exhibits no discharge. No scleral icterus.  Neck: Normal range of motion. Neck supple.  Cardiovascular: Normal  rate and regular rhythm.  Exam reveals no gallop and no friction rub.   No murmur heard. Pulmonary/Chest: Effort normal and breath sounds normal. No stridor. No respiratory distress. He has no rales.  Abdominal: Soft. He exhibits no distension. There is tenderness in the left lower quadrant. There is rebound and guarding. There is no rigidity and no CVA tenderness.  Musculoskeletal: He exhibits no edema or tenderness.  Neurological: He is alert and oriented to person, place, and time.  Soft spoken. Baseline quadriplegia   Skin: Skin is warm and dry. No rash noted. He is not diaphoretic. No erythema.  Psychiatric: He has a normal mood and affect.  Vitals reviewed.    ED Treatments / Results  Labs (all labs ordered are listed, but only abnormal results are displayed) Labs Reviewed  COMPREHENSIVE METABOLIC PANEL - Abnormal; Notable for the following:       Result Value   Chloride 113 (*)    CO2 17 (*)    Glucose, Bld 119 (*)    BUN 31 (*)    Creatinine, Ser 1.92 (*)    GFR calc non Af Amer 36 (*)    GFR calc Af Amer 41 (*)    All other components within  normal limits  CBC - Abnormal; Notable for the following:    WBC 26.2 (*)    RDW 16.3 (*)    All other components within normal limits  CULTURE, BLOOD (ROUTINE X 2)  CULTURE, BLOOD (ROUTINE X 2)  LIPASE, BLOOD  URINALYSIS, ROUTINE W REFLEX MICROSCOPIC  I-STAT CG4 LACTIC ACID, ED  I-STAT CG4 LACTIC ACID, ED    EKG  EKG Interpretation  Date/Time:  Saturday September 27 2016 20:34:01 EST Ventricular Rate:  109 PR Interval:    QRS Duration: 104 QT Interval:  319 QTC Calculation: 430 R Axis:   131 Text Interpretation:  Sinus tachycardia Probable right ventricular hypertrophy Otherwise no significant change Confirmed by [facility] MD, Eliyahu Bille ([id number]) on 09/27/2016 9:09:35 PM       Radiology Ct Abdomen Pelvis Wo Contrast  Result Date: 09/27/2016 CLINICAL DATA:  61 year old male with left lower quadrant abdominal pain since this morning. Nausea. History of renal cell carcinoma status post left nephrectomy. EXAM: CT ABDOMEN AND PELVIS WITHOUT CONTRAST TECHNIQUE: Multidetector CT imaging of the abdomen and pelvis was performed following the standard protocol without IV contrast. COMPARISON:  Multiple priors, most recently PET-CT 12/25/2015. FINDINGS: Lower chest: 8 mm pulmonary nodule in the left lower lobe (image 35 of series 7), similar to the prior PET-CT. Extensive scarring in the visualize lung bases. Aortic atherosclerosis. Hepatobiliary: No definite cystic or solid hepatic lesions are noted. Status post cholecystectomy. Pancreas: No definite pancreatic mass or peripancreatic inflammatory changes are noted on today's noncontrast CT examination. Spleen: 4.4 cm subtle low-attenuation lesion in the periphery of the spleen is similar to several prior examinations, potentially a metastatic lesion. Adrenals/Urinary Tract: Status post left nephrectomy. Areas of fat necrosis are again noted in the nephrectomy bed. 9 mm high attenuation lesion extending exophytically from the lower pole of the right  kidney is similar to prior studies, incompletely characterized on today's noncontrast CT examination, but likely to represent a small proteinaceous/hemorrhagic cysts. Right adrenal gland is unremarkable in appearance although there is a small adjacent peritoneal implant. Enlarging lesion in the left adrenal gland which currently measures 2.1 x 2.3 cm, concerning for metastatic lesion. No hydroureteronephrosis on the right side. Urinary bladder is unremarkable in appearance. Stomach/Bowel: Unenhanced appearance of the stomach  is normal. There is no pathologic dilatation of small bowel or colon. Numerous colonic diverticulae are noted. Notably, in the proximal sigmoid colon there is extensive inflammation around several diverticulae where there is also some adjacent extraluminal gas, concerning for an acute diverticulitis with frank perforation. This is best appreciated on axial images 87 and 88 of series 2. In addition, there is a small amount of extraluminal fluid best appreciated on axial image 73 of series 2 and coronal image 59 of series 4 where this measures 1.5 x 2.3 x 3.6 cm, likely to reflect a developing diverticular abscess. Normal appendix. Vascular/Lymphatic: Aortic atherosclerosis, without evidence of aneurysm in the abdominal or pelvic vasculature. No lymphadenopathy noted in the abdomen or pelvis. Reproductive: Prostate gland and seminal vesicles are unremarkable in appearance. Other: Several small locules of extraluminal gas are noted throughout the peritoneal cavity. Multiple soft tissue masses are noted throughout the peritoneal cavity. The largest of these is located centrally and has increased in size compared to the prior examinations measuring up to 6.0 x 8.7 cm (axial image 52 of series 2) on today's examination versus 4.8 x 5.3 cm on prior PET-CT 12/25/2015. Another large lesion is also noted in the right pericolic gutter measuring up to 5.6 x 4.8 cm (axial image 53 of series 2). Several other  smaller soft tissue lesions are also noted elsewhere throughout the peritoneal cavity, compatible with progressive peritoneal disease. No significant volume of ascites. Musculoskeletal: There are no aggressive appearing lytic or blastic lesions noted in the visualized portions of the skeleton. IMPRESSION: 1. Acute perforated diverticulitis of the proximal sigmoid colon. Small volume of pneumoperitoneum noted. Small volume of extra luminal fluid adjacent to the site of perforation likely reflects a developing diverticular abscess. Surgical consultation is strongly recommended. 2. Progression of metastatic disease, including worsening intraperitoneal metastatic lesions and enlarging left adrenal lesion, as detailed above. Splenic lesion appears very similar to prior examinations. 3. Aortic atherosclerosis. 4. Additional incidental findings, as above. Critical Value/emergent results were called by telephone at the time of interpretation on 09/27/2016 at 9:30 pm to Dr. Addison Lank, who verbally acknowledged these results. Electronically Signed   By: Vinnie Langton M.D.   On: 09/27/2016 21:31    Procedures Procedures (including critical care time)  Medications Ordered in ED Medications  iopamidol (ISOVUE-300) 61 % injection (not administered)  sodium chloride 0.9 % injection (not administered)  fentaNYL (SUBLIMAZE) injection 50 mcg (not administered)  fentaNYL (SUBLIMAZE) injection 50 mcg (50 mcg Intravenous Given 09/27/16 1843)  ondansetron (ZOFRAN) injection 4 mg (4 mg Intravenous Given 09/27/16 1843)  sodium chloride 0.9 % bolus 1,000 mL (0 mLs Intravenous Stopped 09/27/16 2017)  iopamidol (ISOVUE-300) 61 % injection (30 mLs Oral Contrast Given 09/27/16 2055)  metroNIDAZOLE (FLAGYL) IVPB 500 mg (0 mg Intravenous Stopped 09/27/16 2120)  ciprofloxacin (CIPRO) IVPB 400 mg (0 mg Intravenous Stopped 09/27/16 2121)     Initial Impression / Assessment and Plan / ED Course  I have reviewed the triage vital  signs and the nursing notes.  Pertinent labs & imaging results that were available during my care of the patient were reviewed by me and considered in my medical decision making (see chart for details).  Clinical Course as of Sep 28 2219  Sat Sep 27, 2016  1830 Presentation concerning for diverticulitis or other intra-abd source. Will check UA to rule out UTI. Will obtain septic work up as well as CT of abd.  [PC]  1944 UA w/o evidence if UTI. Code  sepsis initiated due to fever, tachycardia, and leukocytosis with likely intra-abd source. Started on empiric ABX. Lactic acid less than 2, systolic blood pressures greater than 90; patient is not meet septic shock criteria does not require 30 mL/kg of IV fluids.  [PC]  2215 CT confirmed diverticulitis status complicated by abscess formation and perforation. Consulted surgery who will evaluate the patient in the emergency department. Patient updated on findings.   Sepsis - Repeat Assessment  Heart:   Improved Tachycardia  Lungs:    CTA  Capillary Refill:   <2 sec  Peripheral Pulse:   Radial pulse palpable  Skin:     Normal Color and Dry   [PC]    Clinical Course User Index [PC] Fatima Blank, MD    Admitted by EGS.  Final Clinical Impressions(s) / ED Diagnoses   Final diagnoses:  Diverticulitis of large intestine with perforation and abscess without bleeding  Sepsis, due to unspecified organism [facility])      Fatima Blank, MD 09/28/16 [phone]

## 2016-09-27 NOTE — ED Notes (Signed)
Bed: LX72 Expected date:  Expected time:  Means of arrival:  Comments: Triage

## 2016-09-27 NOTE — H&P (Addendum)
Austin [name] is an 61 y.o. male.   Chief Complaint: abdominal pain.   HPI:  Pt is a 61 yo M who presents to the ED with around 12-16 hours of LLQ pain.  I am consulted by Dr. Leonette Monarch to see him for acute perforated diverticulitis.  He had breakfast normally, but then wanted to take some of his tramadol and go back to bed which was very unusual for him.  He complained to his wife of sharp LLQ pain.  He tried having a BM but this did not help the pain.  His wife called his doctor when this pain did not abate and worsened.    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  His wife and daughter have been assisting him with his care and he has not had a decubitus ulcer.  Complicating this is recurrent metastatic renal cell cancer.  He had a left radical nephrectomy in 2013. Peritoneal metastases were noted in 2015.  He tried systemic therapy, but did not tolerate it well.    He has had a colonoscopy in the past.  He had multiple polyps removed.  His brother had colon cancer and died at age 44.  He had a massive GI bleed and a CT was done as part of his care.  This is where the kidney tumor was discovered.  There is no note of diverticulosis on this scope.  His mother developed diverticulitis recently at age 31.    Past Medical History:  Diagnosis Date  . Cerebrovascular accident ([place]) 1994   hx of brainstem stroke, residual diminished lung capacity  . Chronic kidney disease    Left Renal Mass  . Diabetes mellitus without complication ([place])   . Hx of colonic polyps   . Hypertension   . Hypogonadism   . 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 Pituitary adenoma ([place])   . 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 Sleep apnea    stopbang=4  . Urinary incontinence   . Venous stasis    edema    Past Surgical History:  Procedure Laterality Date  .  CHOLECYSTECTOMY    . COLONOSCOPY  02/27/2012   Procedure: COLONOSCOPY;  Surgeon: Jerene Bears, MD;  Location: [facility];  Service: Gastroenterology;  Laterality: N/A;  . gamma knife radiation surgery  05/2010   for pituitary  . PITUITARY SURGERY  2007, 2011  . ROBOT ASSISTED LAPAROSCOPIC NEPHRECTOMY  04/23/2012   Procedure: ROBOTIC ASSISTED LAPAROSCOPIC NEPHRECTOMY;  Surgeon: Alexis Frock, MD;  Location: [facility];  Service: Urology;  Laterality: Left;  radical  . Robotic Partial Nephrectomy  04-23-12   left   Left Renal Mass  . stomach peg  02/1993   removed 5-6 years later  . TRACHEOSTOMY TUBE PLACEMENT  02/1993   removed 04/1993  . UMBILICAL HERNIA REPAIR  04/23/2012   Procedure: HERNIA REPAIR UMBILICAL ADULT;  Surgeon: Alexis Frock, MD;  Location: [facility];  Service: Urology;;  . [name]  . vocal cord surgery     injected with collagen, then fat from stomach to improve speech s/p stroke    Family History  Problem Relation Age of Onset  . Alcohol abuse Father   . Throat cancer Father   . Esophageal cancer Father 9  . Arthritis Mother   . Hyperlipidemia Mother   .  Uterine cancer Mother 37  . Colon cancer Brother   . Arthritis Maternal Grandmother   . Hypertension Brother   . Stroke Paternal Grandmother    Social History:  reports that he quit smoking about 24 years ago. His smoking use included Cigarettes. He has a 11.50 pack-year smoking history. He has never used smokeless tobacco. He reports that he does not drink alcohol or use drugs.  Allergies:  Allergies  Allergen Reactions  . Penicillins Rash and Other (See Comments)    Has patient had a PCN reaction causing immediate rash, facial/tongue/throat swelling, SOB or lightheadedness with hypotension: No Has patient had a PCN reaction causing severe rash involving mucus membranes or skin necrosis: No Has patient had a PCN reaction that required hospitalization No Has patient had a PCN reaction occurring within the  last 10 years: No If all of the above answers are "NO", then may proceed with Cephalosporin use.    Meds: Outpatient Medications   albuterol (PROVENTIL HFA;VENTOLIN HFA) 108 (90 BASE) MCG/ACT inhaler    atorvastatin (LIPITOR) 20 MG tablet    clopidogrel (PLAVIX) 75 MG tablet    escitalopram (LEXAPRO) 10 MG tablet    ketoconazole (NIZORAL) 2 % cream    lisinopril-hydrochlorothiazide (PRINZIDE,ZESTORETIC) 20-12.5 MG tablet    metFORMIN (GLUCOPHAGE) 500 MG tablet    Multiple Vitamin (MULTIVITAMIN WITH MINERALS) TABS tablet    testosterone enanthate (DELATESTRYL) 200 MG/ML injection    traMADol (ULTRAM) 50 MG tablet    traZODone (DESYREL) 100 MG tablet    triamcinolone cream (KENALOG) 0.1 %    trimethoprim (TRIMPEX) 100 MG tablet      Results for orders placed or performed during the hospital encounter of 09/27/16 (from the past 48 hour(s))  Lipase, blood     Status: None   Collection Time: 09/27/16  6:23 PM  Result Value Ref Range   Lipase 22 11 - 51 U/L  Comprehensive metabolic panel     Status: Abnormal   Collection Time: 09/27/16  6:23 PM  Result Value Ref Range   Sodium 139 135 - 145 mmol/L   Potassium 4.8 3.5 - 5.1 mmol/L   Chloride 113 (H) 101 - 111 mmol/L   CO2 17 (L) 22 - 32 mmol/L   Glucose, Bld 119 (H) 65 - 99 mg/dL   BUN 31 (H) 6 - 20 mg/dL   Creatinine, Ser 1.92 (H) 0.61 - 1.24 mg/dL   Calcium 9.2 8.9 - 10.3 mg/dL   Total Protein 7.9 6.5 - 8.1 g/dL   Albumin 3.5 3.5 - 5.0 g/dL   AST 20 15 - 41 U/L   ALT 27 17 - 63 U/L   Alkaline Phosphatase 70 38 - 126 U/L   Total Bilirubin 0.6 0.3 - 1.2 mg/dL   GFR calc non Af Amer 36 (L) >60 mL/min   GFR calc Af Amer 41 (L) >60 mL/min    Comment: (NOTE) The eGFR has been calculated using the CKD EPI equation. This calculation has not been validated in all clinical situations. eGFR's persistently <60 mL/min signify possible Chronic Kidney Disease.    Anion gap 9 5 - 15  CBC     Status: Abnormal   Collection Time:  09/27/16  6:23 PM  Result Value Ref Range   WBC 26.2 (H) 4.0 - 10.5 K/uL   RBC 4.87 4.22 - 5.81 MIL/uL   Hemoglobin 13.7 13.0 - 17.0 g/dL   HCT 42.0 39.0 - 52.0 %   MCV 86.2 78.0 - 100.0 fL  MCH 28.1 26.0 - 34.0 pg   MCHC 32.6 30.0 - 36.0 g/dL   RDW 16.3 (H) 11.5 - 15.5 %   Platelets 302 150 - 400 K/uL  I-Stat CG4 Lactic Acid, ED     Status: None   Collection Time: 09/27/16  6:32 PM  Result Value Ref Range   Lactic Acid, Venous 1.45 0.5 - 1.9 mmol/L  Urinalysis, Routine w reflex microscopic     Status: None   Collection Time: 09/27/16  6:40 PM  Result Value Ref Range   Color, Urine YELLOW YELLOW   APPearance CLEAR CLEAR   Specific Gravity, Urine 1.017 1.005 - 1.030   pH 5.0 5.0 - 8.0   Glucose, UA NEGATIVE NEGATIVE mg/dL   Hgb urine dipstick NEGATIVE NEGATIVE   Bilirubin Urine NEGATIVE NEGATIVE   Ketones, ur NEGATIVE NEGATIVE mg/dL   Protein, ur NEGATIVE NEGATIVE mg/dL   Nitrite NEGATIVE NEGATIVE   Leukocytes, UA NEGATIVE NEGATIVE   Ct Abdomen Pelvis Wo Contrast  Result Date: 09/27/2016 CLINICAL DATA:  61 year old male with left lower quadrant abdominal pain since this morning. Nausea. History of renal cell carcinoma status post left nephrectomy. EXAM: CT ABDOMEN AND PELVIS WITHOUT CONTRAST TECHNIQUE: Multidetector CT imaging of the abdomen and pelvis was performed following the standard protocol without IV contrast. COMPARISON:  Multiple priors, most recently PET-CT 12/25/2015. FINDINGS: Lower chest: 8 mm pulmonary nodule in the left lower lobe (image 35 of series 7), similar to the prior PET-CT. Extensive scarring in the visualize lung bases. Aortic atherosclerosis. Hepatobiliary: No definite cystic or solid hepatic lesions are noted. Status post cholecystectomy. Pancreas: No definite pancreatic mass or peripancreatic inflammatory changes are noted on today's noncontrast CT examination. Spleen: 4.4 cm subtle low-attenuation lesion in the periphery of the spleen is similar to  several prior examinations, potentially a metastatic lesion. Adrenals/Urinary Tract: Status post left nephrectomy. Areas of fat necrosis are again noted in the nephrectomy bed. 9 mm high attenuation lesion extending exophytically from the lower pole of the right kidney is similar to prior studies, incompletely characterized on today's noncontrast CT examination, but likely to represent a small proteinaceous/hemorrhagic cysts. Right adrenal gland is unremarkable in appearance although there is a small adjacent peritoneal implant. Enlarging lesion in the left adrenal gland which currently measures 2.1 x 2.3 cm, concerning for metastatic lesion. No hydroureteronephrosis on the right side. Urinary bladder is unremarkable in appearance. Stomach/Bowel: Unenhanced appearance of the stomach is normal. There is no pathologic dilatation of small bowel or colon. Numerous colonic diverticulae are noted. Notably, in the proximal sigmoid colon there is extensive inflammation around several diverticulae where there is also some adjacent extraluminal gas, concerning for an acute diverticulitis with frank perforation. This is best appreciated on axial images 87 and 88 of series 2. In addition, there is a small amount of extraluminal fluid best appreciated on axial image 73 of series 2 and coronal image 59 of series 4 where this measures 1.5 x 2.3 x 3.6 cm, likely to reflect a developing diverticular abscess. Normal appendix. Vascular/Lymphatic: Aortic atherosclerosis, without evidence of aneurysm in the abdominal or pelvic vasculature. No lymphadenopathy noted in the abdomen or pelvis. Reproductive: Prostate gland and seminal vesicles are unremarkable in appearance. Other: Several small locules of extraluminal gas are noted throughout the peritoneal cavity. Multiple soft tissue masses are noted throughout the peritoneal cavity. The largest of these is located centrally and has increased in size compared to the prior examinations  measuring up to 6.0 x 8.7 cm (axial  image 52 of series 2) on today's examination versus 4.8 x 5.3 cm on prior PET-CT 12/25/2015. Another large lesion is also noted in the right pericolic gutter measuring up to 5.6 x 4.8 cm (axial image 53 of series 2). Several other smaller soft tissue lesions are also noted elsewhere throughout the peritoneal cavity, compatible with progressive peritoneal disease. No significant volume of ascites. Musculoskeletal: There are no aggressive appearing lytic or blastic lesions noted in the visualized portions of the skeleton. IMPRESSION: 1. Acute perforated diverticulitis of the proximal sigmoid colon. Small volume of pneumoperitoneum noted. Small volume of extra luminal fluid adjacent to the site of perforation likely reflects a developing diverticular abscess. Surgical consultation is strongly recommended. 2. Progression of metastatic disease, including worsening intraperitoneal metastatic lesions and enlarging left adrenal lesion, as detailed above. Splenic lesion appears very similar to prior examinations. 3. Aortic atherosclerosis. 4. Additional incidental findings, as above. Critical Value/emergent results were called by telephone at the time of interpretation on 09/27/2016 at 9:30 pm to Dr. Addison Lank, who verbally acknowledged these results. Electronically Signed   By: Vinnie Langton M.D.   On: 09/27/2016 21:31    Review of Systems  Constitutional: Positive for fever.  HENT: Negative.   Eyes: Negative.   Respiratory: Negative.   Cardiovascular: Negative.   Gastrointestinal: Positive for abdominal pain.  Genitourinary: Negative.   Skin: Negative.   Neurological: Positive for weakness.       Quadriplegia but sensation intact  Endo/Heme/Allergies: Negative.   Psychiatric/Behavioral: Negative.     Blood pressure 129/76, pulse 103, temperature 99.9 F (37.7 C), temperature source Rectal, resp. rate 15, height 6' (1.829 m), SpO2 93 %. Physical Exam    Constitutional: He is oriented to person, place, and time. He appears well-developed and well-nourished. He appears distressed.  HENT:  Head: Normocephalic and atraumatic.  Right Ear: External ear normal.  Left Ear: External ear normal.  Very soft voice  Eyes: Conjunctivae are normal. Pupils are equal, round, and reactive to light. Right eye exhibits no discharge. Left eye exhibits no discharge. No scleral icterus.  Neck: Neck supple.  Cardiovascular: Normal rate, regular rhythm and intact distal pulses.   Respiratory: Effort normal. No respiratory distress. He exhibits no tenderness.  GI: Soft. He exhibits no distension. There is tenderness (LLQ, no rebound, guarding, or referred pain.  ). There is guarding (voluntary guarding LLQ only). There is no rebound.  Musculoskeletal: Normal range of motion. He exhibits no edema or tenderness.  Neurological: He is alert and oriented to person, place, and time.  Skin: Skin is warm and dry. No rash noted. He is not diaphoretic. No erythema. No pallor.  Psychiatric: He has a normal mood and affect. His behavior is normal. Judgment and thought content normal.     Assessment/Plan Sigmoid diverticulitis with small abscess and perforation.  Tenderness is localized with small abscess and scant amount of free air.  Will try non operative therapy with bowel rest and IV antibiotics.    Reviewed that we may need to do surgery with ostomy if he worsens.  Also discussed that he may seem to get better acutely, but may then worsen later.  In that case, he may need a repeat CT and drain.  His kidney cancer compounds this problem.  There is an 8.7 cm met directly under the midline fascia.  This is progressive and stage IV, but he has been eating normally taking only tramadol for pain.  I discussed that the peritoneal disease may be  more extensive and widespread than what we see on the scan.  We may operate and not be able to get to the sigmoid well.    I discussed  the role of ostomy in the treatment of diverticulitis.    Will hold plavix.    Stark Klein, MD 09/27/2016, 11:02 PM

## 2016-09-27 NOTE — ED Notes (Signed)
Consulting physician/ surgeon  at bedside.

## 2016-09-27 NOTE — ED Triage Notes (Signed)
Per pt/wife-states LLQ pain that started this am-states nausea-states history of kidney cancer-PCP advised him to come to ED

## 2016-09-27 NOTE — ED Notes (Signed)
ED Provider at bedside. 

## 2016-09-28 DIAGNOSIS — G8250 Quadriplegia, unspecified: Secondary | ICD-10-CM

## 2016-09-28 LAB — BASIC METABOLIC PANEL
ANION GAP: 6 (ref 5–15)
BUN: 29 mg/dL — ABNORMAL HIGH (ref 6–20)
CALCIUM: 8.3 mg/dL — AB (ref 8.9–10.3)
CO2: 19 mmol/L — AB (ref 22–32)
CREATININE: 1.64 mg/dL — AB (ref 0.61–1.24)
Chloride: 114 mmol/L — ABNORMAL HIGH (ref 101–111)
GFR calc Af Amer: 50 mL/min — ABNORMAL LOW (ref >60)
GFR, EST NON AFRICAN AMERICAN: 43 mL/min — AB (ref >60)
GLUCOSE: 107 mg/dL — AB (ref 65–99)
Potassium: 5 mmol/L (ref 3.5–5.1)
Sodium: 139 mmol/L (ref 135–145)

## 2016-09-28 LAB — CBC
HCT: 35.5 % — ABNORMAL LOW (ref 39.0–52.0)
Hemoglobin: 11.5 g/dL — ABNORMAL LOW (ref 13.0–17.0)
MCH: 28 pg (ref 26.0–34.0)
MCHC: 32.4 g/dL (ref 30.0–36.0)
MCV: 86.4 fL (ref 78.0–100.0)
Platelets: 269 10*3/uL (ref 150–400)
RBC: 4.11 MIL/uL — ABNORMAL LOW (ref 4.22–5.81)
RDW: 16.3 % — ABNORMAL HIGH (ref 11.5–15.5)
WBC: 24.5 10*3/uL — ABNORMAL HIGH (ref 4.0–10.5)

## 2016-09-28 LAB — MAGNESIUM: MAGNESIUM: 1.8 mg/dL (ref 1.7–2.4)

## 2016-09-28 LAB — HIV ANTIBODY (ROUTINE TESTING W REFLEX): HIV SCREEN 4TH GENERATION: NONREACTIVE

## 2016-09-28 LAB — MRSA PCR SCREENING: MRSA by PCR: NEGATIVE

## 2016-09-28 MED ORDER — ACETAMINOPHEN 325 MG PO TABS
650.0000 mg | ORAL_TABLET | Freq: Four times a day (QID) | ORAL | Status: DC | PRN
  Administered 2016-10-06 – 2016-10-08 (×4): 650 mg via ORAL
  Filled 2016-09-28 (×4): qty 2

## 2016-09-28 MED ORDER — ORAL CARE MOUTH RINSE
15.0000 mL | Freq: Two times a day (BID) | OROMUCOSAL | Status: DC
  Administered 2016-09-29 – 2016-10-09 (×9): 15 mL via OROMUCOSAL

## 2016-09-28 MED ORDER — MEROPENEM 1 G IV SOLR
1.0000 g | Freq: Once | INTRAVENOUS | Status: AC
  Administered 2016-09-28: 1 g via INTRAVENOUS
  Filled 2016-09-28: qty 1

## 2016-09-28 MED ORDER — DIPHENHYDRAMINE HCL 50 MG/ML IJ SOLN: 12.5000 mg | Freq: Four times a day (QID) | INTRAMUSCULAR | Status: DC | PRN

## 2016-09-28 MED ORDER — SODIUM CHLORIDE 0.9 % IV SOLN
INTRAVENOUS | Status: AC
  Administered 2016-09-29: 01:00:00 via INTRAVENOUS

## 2016-09-28 MED ORDER — SODIUM CHLORIDE 0.9 % IV SOLN
1.0000 g | Freq: Two times a day (BID) | INTRAVENOUS | Status: DC
  Administered 2016-09-28 – 2016-09-29 (×3): 1 g via INTRAVENOUS
  Filled 2016-09-28 (×3): qty 1

## 2016-09-28 MED ORDER — SODIUM CHLORIDE 0.9 % IV SOLN
INTRAVENOUS | Status: AC
  Administered 2016-09-28: 01:00:00 via INTRAVENOUS

## 2016-09-28 MED ORDER — FENTANYL CITRATE (PF) 100 MCG/2ML IJ SOLN
25.0000 ug | INTRAMUSCULAR | Status: DC | PRN
  Administered 2016-09-28 (×3): 50 ug via INTRAVENOUS
  Administered 2016-09-28: 25 ug via INTRAVENOUS
  Administered 2016-09-28 – 2016-09-30 (×7): 50 ug via INTRAVENOUS
  Administered 2016-10-01 – 2016-10-02 (×3): 25 ug via INTRAVENOUS
  Administered 2016-10-03 – 2016-10-04 (×3): 50 ug via INTRAVENOUS
  Administered 2016-10-05 – 2016-10-06 (×3): 25 ug via INTRAVENOUS
  Filled 2016-09-28 (×20): qty 2

## 2016-09-28 MED ORDER — KETOCONAZOLE 2 % EX CREA
1.0000 "application " | TOPICAL_CREAM | Freq: Two times a day (BID) | CUTANEOUS | Status: DC | PRN
  Filled 2016-09-28: qty 15

## 2016-09-28 MED ORDER — TRIMETHOPRIM 100 MG PO TABS
100.0000 mg | ORAL_TABLET | Freq: Every day | ORAL | Status: DC
  Administered 2016-09-28 – 2016-10-09 (×12): 100 mg via ORAL
  Filled 2016-09-28 (×12): qty 1

## 2016-09-28 MED ORDER — ALBUTEROL SULFATE (2.5 MG/3ML) 0.083% IN NEBU
2.5000 mg | INHALATION_SOLUTION | Freq: Four times a day (QID) | RESPIRATORY_TRACT | Status: DC | PRN
  Administered 2016-10-05: 2.5 mg via RESPIRATORY_TRACT
  Filled 2016-09-28: qty 3

## 2016-09-28 MED ORDER — DIPHENHYDRAMINE HCL 12.5 MG/5ML PO ELIX: 12.5000 mg | ORAL_SOLUTION | Freq: Four times a day (QID) | ORAL | Status: DC | PRN

## 2016-09-28 MED ORDER — ONDANSETRON HCL 4 MG/2ML IJ SOLN: 4.0000 mg | Freq: Four times a day (QID) | INTRAMUSCULAR | Status: DC | PRN

## 2016-09-28 MED ORDER — METHOCARBAMOL 500 MG PO TABS: 500.0000 mg | ORAL_TABLET | Freq: Four times a day (QID) | ORAL | Status: DC | PRN

## 2016-09-28 MED ORDER — ONDANSETRON 4 MG PO TBDP: 4.0000 mg | ORAL_TABLET | Freq: Four times a day (QID) | ORAL | Status: DC | PRN

## 2016-09-28 MED ORDER — TRIAMCINOLONE ACETONIDE 0.1 % EX CREA
1.0000 "application " | TOPICAL_CREAM | Freq: Two times a day (BID) | CUTANEOUS | Status: DC | PRN
  Filled 2016-09-28: qty 15

## 2016-09-28 MED ORDER — ESCITALOPRAM OXALATE 20 MG PO TABS
10.0000 mg | ORAL_TABLET | Freq: Every day | ORAL | Status: DC
  Administered 2016-09-28 – 2016-10-09 (×12): 10 mg via ORAL
  Filled 2016-09-28 (×12): qty 1

## 2016-09-28 MED ORDER — PANTOPRAZOLE SODIUM 40 MG IV SOLR
40.0000 mg | Freq: Every day | INTRAVENOUS | Status: DC
  Administered 2016-09-28 (×2): 40 mg via INTRAVENOUS
  Filled 2016-09-28 (×2): qty 40

## 2016-09-28 MED ORDER — ACETAMINOPHEN 650 MG RE SUPP: 650.0000 mg | Freq: Four times a day (QID) | RECTAL | Status: DC | PRN

## 2016-09-28 MED ORDER — ALBUTEROL SULFATE HFA 108 (90 BASE) MCG/ACT IN AERS: 2.0000 | INHALATION_SPRAY | Freq: Four times a day (QID) | RESPIRATORY_TRACT | Status: DC | PRN

## 2016-09-28 MED ORDER — TRAZODONE HCL 50 MG PO TABS: 100.0000 mg | ORAL_TABLET | Freq: Every evening | ORAL | Status: DC | PRN

## 2016-09-28 NOTE — Progress Notes (Addendum)
Subjective: Pt states pain is a little better this AM.  Denies n/v.  Objective: Vital signs in last 24 hours: Temp:  [97.7 F (36.5 C)-101.4 F (38.6 C)] 99.3 F (37.4 C) (02/18 0800) Pulse Rate:  [89-140] 92 (02/18 0730) Resp:  [12-21] 14 (02/18 0730) BP: (104-146)/(52-84) 115/53 (02/18 0700) SpO2:  [92 %-96 %] 95 % (02/18 0730) Weight:  [78.8 kg (173 lb 11.6 oz)] 78.8 kg (173 lb 11.6 oz) (02/18 0200) Last BM Date:  (PTA pt uncertain)  Intake/Output from previous day: 02/17 0701 - 02/18 0700 In: 507.5 [I.V.:407.5; IV Piggyback:100] Out: 250 [Urine:250] Intake/Output this shift: Total I/O In: 525 [I.V.:525] Out: -   General appearance: alert, cooperative and no distress Resp: shallow breaths, comfortable Cardio: regular rate and rhythm GI: soft, minimal distended, LLQ tenderness. Neurologic: quadriplegic, very faint voice.    Lab Results:   Recent Labs  09/27/16 1823 09/28/16 0333  WBC 26.2* 24.5*  HGB 13.7 11.5*  HCT 42.0 35.5*  PLT 302 269   BMET  Recent Labs  09/27/16 1823 09/28/16 0333  NA 139 139  K 4.8 5.0  CL 113* 114*  CO2 17* 19*  GLUCOSE 119* 107*  BUN 31* 29*  CREATININE 1.92* 1.64*  CALCIUM 9.2 8.3*   PT/INR No results for input(s): LABPROT, INR in the last 72 hours. ABG No results for input(s): PHART, HCO3 in the last 72 hours.  Invalid input(s): PCO2, PO2  Studies/Results: Ct Abdomen Pelvis Wo Contrast  Result Date: 09/27/2016 CLINICAL DATA:  61 year old male with left lower quadrant abdominal pain since this morning. Nausea. History of renal cell carcinoma status post left nephrectomy. EXAM: CT ABDOMEN AND PELVIS WITHOUT CONTRAST TECHNIQUE: Multidetector CT imaging of the abdomen and pelvis was performed following the standard protocol without IV contrast. COMPARISON:  Multiple priors, most recently PET-CT 12/25/2015. FINDINGS: Lower chest: 8 mm pulmonary nodule in the left lower lobe (image 35 of series 7), similar to the prior  PET-CT. Extensive scarring in the visualize lung bases. Aortic atherosclerosis. Hepatobiliary: No definite cystic or solid hepatic lesions are noted. Status post cholecystectomy. Pancreas: No definite pancreatic mass or peripancreatic inflammatory changes are noted on today's noncontrast CT examination. Spleen: 4.4 cm subtle low-attenuation lesion in the periphery of the spleen is similar to several prior examinations, potentially a metastatic lesion. Adrenals/Urinary Tract: Status post left nephrectomy. Areas of fat necrosis are again noted in the nephrectomy bed. 9 mm high attenuation lesion extending exophytically from the lower pole of the right kidney is similar to prior studies, incompletely characterized on today's noncontrast CT examination, but likely to represent a small proteinaceous/hemorrhagic cysts. Right adrenal gland is unremarkable in appearance although there is a small adjacent peritoneal implant. Enlarging lesion in the left adrenal gland which currently measures 2.1 x 2.3 cm, concerning for metastatic lesion. No hydroureteronephrosis on the right side. Urinary bladder is unremarkable in appearance. Stomach/Bowel: Unenhanced appearance of the stomach is normal. There is no pathologic dilatation of small bowel or colon. Numerous colonic diverticulae are noted. Notably, in the proximal sigmoid colon there is extensive inflammation around several diverticulae where there is also some adjacent extraluminal gas, concerning for an acute diverticulitis with frank perforation. This is best appreciated on axial images 87 and 88 of series 2. In addition, there is a small amount of extraluminal fluid best appreciated on axial image 73 of series 2 and coronal image 59 of series 4 where this measures 1.5 x 2.3 x 3.6 cm, likely to reflect a developing  diverticular abscess. Normal appendix. Vascular/Lymphatic: Aortic atherosclerosis, without evidence of aneurysm in the abdominal or pelvic vasculature. No  lymphadenopathy noted in the abdomen or pelvis. Reproductive: Prostate gland and seminal vesicles are unremarkable in appearance. Other: Several small locules of extraluminal gas are noted throughout the peritoneal cavity. Multiple soft tissue masses are noted throughout the peritoneal cavity. The largest of these is located centrally and has increased in size compared to the prior examinations measuring up to 6.0 x 8.7 cm (axial image 52 of series 2) on today's examination versus 4.8 x 5.3 cm on prior PET-CT 12/25/2015. Another large lesion is also noted in the right pericolic gutter measuring up to 5.6 x 4.8 cm (axial image 53 of series 2). Several other smaller soft tissue lesions are also noted elsewhere throughout the peritoneal cavity, compatible with progressive peritoneal disease. No significant volume of ascites. Musculoskeletal: There are no aggressive appearing lytic or blastic lesions noted in the visualized portions of the skeleton. IMPRESSION: 1. Acute perforated diverticulitis of the proximal sigmoid colon. Small volume of pneumoperitoneum noted. Small volume of extra luminal fluid adjacent to the site of perforation likely reflects a developing diverticular abscess. Surgical consultation is strongly recommended. 2. Progression of metastatic disease, including worsening intraperitoneal metastatic lesions and enlarging left adrenal lesion, as detailed above. Splenic lesion appears very similar to prior examinations. 3. Aortic atherosclerosis. 4. Additional incidental findings, as above. Critical Value/emergent results were called by telephone at the time of interpretation on 09/27/2016 at 9:30 pm to Dr. Addison Lank, who verbally acknowledged these results. Electronically Signed   By: Vinnie Langton M.D.   On: 09/27/2016 21:31    Anti-infectives: Anti-infectives    Start     Dose/Rate Route Frequency Ordered Stop   09/28/16 1000  trimethoprim (TRIMPEX) tablet 100 mg     100 mg Oral Daily  09/28/16 0046     09/28/16 1000  meropenem (MERREM) 1 g in sodium chloride 0.9 % 100 mL IVPB     1 g 200 mL/hr over 30 Minutes Intravenous Every 12 hours 09/28/16 0314     09/28/16 0100  meropenem (MERREM) 1 g in sodium chloride 0.9 % 100 mL IVPB     1 g 200 mL/hr over 30 Minutes Intravenous  Once 09/28/16 0058 09/28/16 0151   09/27/16 1945  metroNIDAZOLE (FLAGYL) IVPB 500 mg     500 mg 100 mL/hr over 60 Minutes Intravenous  Once 09/27/16 1944 09/27/16 2120   09/27/16 1945  ciprofloxacin (CIPRO) IVPB 400 mg     400 mg 200 mL/hr over 60 Minutes Intravenous  Once 09/27/16 1944 09/27/16 2121      Assessment/Plan:  Sigmoid diverticulitis with small abscess and perforation 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   Recurrent metastatic renal cell cancer.    Bowel rest,  IV fluids IV antibiotics. Some improvement in WBCs and symptoms, though minimal. High risk for needing operative intervention. Acute kidney injury improved. Holding plavix which is for h/o CVA.    LOS: 1 day    [facility] 09/28/2016

## 2016-09-28 NOTE — Progress Notes (Signed)
Facilities called and a soft touch call bell obtained. Patient given bell and educated on how to use. Patient tested bell and call bell within reach.

## 2016-09-28 NOTE — Progress Notes (Signed)
Pt and family concerned about spreading of pain across lower abdomen. Surgeon on call paged and stated to continue plan as no other new symptoms or change in vital signs. Patient states pain under control just worry about new area of pain. RN to continue to monitor.

## 2016-09-28 NOTE — Progress Notes (Signed)
Pharmacy Antibiotic Note  Austin [name] is a 61 y.o. male admitted on 09/27/2016 with Intra-abdominal infection .  Pharmacy has been consulted for meropenem dosing.  Plan: Meropenem 1gm iv q12hr  Height: 6' (182.9 cm) Weight: 173 lb 11.6 oz (78.8 kg) IBW/kg (Calculated) : 77.6  Temp (24hrs), Avg:99.7 F (37.6 C), Min:97.7 F (36.5 C), Max:101.4 F (38.6 C)   Recent Labs Lab 09/27/16 1823 09/27/16 1832 09/27/16 2332  WBC 26.2*  --   --   CREATININE 1.92*  --   --   LATICACIDVEN  --  1.45 1.53    Estimated Creatinine Clearance: 43.8 mL/min (by C-G formula based on SCr of 1.92 mg/dL (H)).    Allergies  Allergen Reactions  . Penicillins Rash and Other (See Comments)    Has patient had a PCN reaction causing immediate rash, facial/tongue/throat swelling, SOB or lightheadedness with hypotension: No Has patient had a PCN reaction causing severe rash involving mucus membranes or skin necrosis: No Has patient had a PCN reaction that required hospitalization No Has patient had a PCN reaction occurring within the last 10 years: No If all of the above answers are "NO", then may proceed with Cephalosporin use.    Antimicrobials this admission: Ciprofloxacin 2/17 x1 Flagyl 2/17 x1 Meropenem 2/18 >>   Dose adjustments this admission: -  Microbiology results: pending  Thank you for allowing pharmacy to be a part of this patient's care.  Nani Skillern Crowford 09/28/2016 3:14 AM

## 2016-09-29 MED ORDER — MAGIC MOUTHWASH
15.0000 mL | Freq: Four times a day (QID) | ORAL | Status: DC | PRN
  Filled 2016-09-29 (×2): qty 15

## 2016-09-29 MED ORDER — PANTOPRAZOLE SODIUM 40 MG PO TBEC
40.0000 mg | DELAYED_RELEASE_TABLET | Freq: Every day | ORAL | Status: DC
  Administered 2016-09-30 – 2016-10-08 (×9): 40 mg via ORAL
  Filled 2016-09-29 (×9): qty 1

## 2016-09-29 MED ORDER — ENOXAPARIN SODIUM 40 MG/0.4ML ~~LOC~~ SOLN
40.0000 mg | SUBCUTANEOUS | Status: DC
  Administered 2016-09-29 – 2016-10-02 (×4): 40 mg via SUBCUTANEOUS
  Filled 2016-09-29 (×4): qty 0.4

## 2016-09-29 MED ORDER — METHOCARBAMOL 1000 MG/10ML IJ SOLN
1000.0000 mg | Freq: Four times a day (QID) | INTRAMUSCULAR | Status: DC | PRN
  Filled 2016-09-29: qty 10

## 2016-09-29 MED ORDER — LIP MEDEX EX OINT
1.0000 "application " | TOPICAL_OINTMENT | Freq: Two times a day (BID) | CUTANEOUS | Status: DC
  Administered 2016-09-29 – 2016-10-09 (×13): 1 via TOPICAL
  Filled 2016-09-29 (×2): qty 7

## 2016-09-29 MED ORDER — SODIUM CHLORIDE 0.9 % IV SOLN
INTRAVENOUS | Status: DC
  Administered 2016-09-29 (×2): via INTRAVENOUS

## 2016-09-29 MED ORDER — SODIUM CHLORIDE 0.9 % IV SOLN
1.0000 g | Freq: Three times a day (TID) | INTRAVENOUS | Status: DC
  Administered 2016-09-29 – 2016-10-09 (×28): 1 g via INTRAVENOUS
  Filled 2016-09-29 (×32): qty 1

## 2016-09-29 MED ORDER — METOPROLOL TARTRATE 5 MG/5ML IV SOLN: 5.0000 mg | Freq: Four times a day (QID) | INTRAVENOUS | Status: DC | PRN

## 2016-09-29 MED ORDER — PROCHLORPERAZINE EDISYLATE 5 MG/ML IJ SOLN: 5.0000 mg | INTRAMUSCULAR | Status: DC | PRN

## 2016-09-29 MED ORDER — KETOROLAC TROMETHAMINE 15 MG/ML IJ SOLN: 15.0000 mg | Freq: Four times a day (QID) | INTRAMUSCULAR | Status: AC | PRN

## 2016-09-29 MED ORDER — PHENOL 1.4 % MT LIQD
2.0000 | OROMUCOSAL | Status: DC | PRN
  Filled 2016-09-29: qty 177

## 2016-09-29 MED ORDER — HYDRALAZINE HCL 20 MG/ML IJ SOLN
5.0000 mg | Freq: Four times a day (QID) | INTRAMUSCULAR | Status: DC | PRN
  Administered 2016-09-30: 20 mg via INTRAVENOUS
  Filled 2016-09-29: qty 1

## 2016-09-29 MED ORDER — ALUM & MAG HYDROXIDE-SIMETH 200-200-20 MG/5ML PO SUSP: 30.0000 mL | Freq: Four times a day (QID) | ORAL | Status: DC | PRN

## 2016-09-29 MED ORDER — MENTHOL 3 MG MT LOZG: 1.0000 | LOZENGE | OROMUCOSAL | Status: DC | PRN

## 2016-09-29 MED ORDER — LACTATED RINGERS IV BOLUS (SEPSIS): 1000.0000 mL | Freq: Three times a day (TID) | INTRAVENOUS | Status: AC | PRN

## 2016-09-29 NOTE — Evaluation (Signed)
Physical Therapy Evaluation Patient Details Name: Austin [name] MRN: [id number] DOB: 09/03/1955 Today's Date: 09/29/2016   History of Present Illness  Pt is a 61 year old male with PMHx of brainstem CVA with residual quadriplegia and recurrent metastatic renal cell cancer.  He had a left radical nephrectomy in 2013. Peritoneal metastases were noted in 2015.  He tried systemic therapy, but did not tolerate it well.  Pt admitted with Sigmoid diverticulitis with small abscess and perforation with current plan for nonoperative management at this time  Clinical Impression  Pt admitted with above diagnosis. Pt currently with functional limitations due to the deficits listed below (see PT Problem List).  Pt will benefit from skilled PT to increase their independence and safety with mobility to allow discharge to the venue listed below.  Pt was able to assist with stand pivot transfers prior to arrival and uses electric wheelchair at baseline for mobility.  Spouse reports pt typically in wheelchair most of the day and able to adjust his positioning frequently to prevent skin breakdown.  Pt will likely progress back to baseline as spouse assisted pt with transferring today.  Per spouse, pt slightly weaker today and typically able to self assist more.   Spouse is very involved in pt's care and appears very knowledgeable with assisting pt with mobility.  Will check back on pt during acute stay.     Follow Up Recommendations No PT follow up;Supervision/Assistance - 24 hour    Equipment Recommendations  None recommended by PT    Recommendations for Other Services       Precautions / Restrictions Precautions Precautions: Fall Precaution Comments: quadriplegia, intact sensation      Mobility  Bed Mobility Overal bed mobility: Needs Assistance Bed Mobility: Supine to Sit;Sit to Supine     Supine to sit: Total assist Sit to supine: Total assist   General bed mobility comments: assist for upper  and lower body, spouse performed supine to sit -first moves LEs over EOB and then assisted trunk, spouse assisted trunk back to bed and therapist brought LEs onto bed  Transfers Overall transfer level: Needs assistance Equipment used: None Transfers: Sit to/from Stand Sit to Stand: Mod assist;Max assist         General transfer comment: spouse demonstrated technique used at home, performed x4, with pt able to assist more with each attempt, pt requires feet placement, pt typically able to assist with bear hug however lines limiting today per pt, pt able to stand and hold <30 sec on last transfer  Ambulation/Gait             General Gait Details: nonambulatory  Stairs            Wheelchair Mobility    Modified Rankin (Stroke Patients Only)       Balance Overall balance assessment: Needs assistance Sitting-balance support: Feet supported;Bilateral upper extremity supported Sitting balance-Leahy Scale: Zero                                       Pertinent Vitals/Pain Pain Assessment: Faces Faces Pain Scale: Hurts little more Pain Location: bil shoulders and scapula Pain Descriptors / Indicators: Sore;Aching Pain Intervention(s): Limited activity within patient's tolerance;Monitored during session;Repositioned    Home Living Family/patient expects to be discharged to:: Private residence Living Arrangements: Spouse/significant other;Children Available Help at Discharge: Family Type of Home: House  Home Equipment: Grab bars - tub/shower;Wheelchair - power Additional Comments: hx of CVA at least 23 years ago, home set up adequately for pt needs per spouse    Prior Function Level of Independence: Needs assistance   Gait / Transfers Assistance Needed: spouse assists pt to standing, pt able to weight bear and assist, can perform stand pivot for transfers with min-mod assist, sometimes uses disc spinning device under pt's feet for pivoting (in  mornings)           Hand Dominance        Extremity/Trunk Assessment   Upper Extremity Assessment Upper Extremity Assessment: RUE deficits/detail;LUE deficits/detail RUE Deficits / Details: pt with residual deficits from CVA, R side with flexion contractures LUE Deficits / Details: pt with residual deficits from CVA    Lower Extremity Assessment Lower Extremity Assessment: RLE deficits/detail;LLE deficits/detail RLE Deficits / Details: pt with bilateral residual deficits from CVA and no active movement observed, some clonus observed with positioning LE flexion for transfers       Communication   Communication: Expressive difficulties  Cognition Arousal/Alertness: Awake/alert Behavior During Therapy: WFL for tasks assessed/performed Overall Cognitive Status: Within Functional Limits for tasks assessed                      General Comments General comments (skin integrity, edema, etc.): spouse reports only pressure injury to sacrum was when pt was on chemo drug, improved after pt stopped treatment    Exercises     Assessment/Plan    PT Assessment Patient needs continued PT services  PT Problem List Decreased strength;Decreased mobility;Decreased activity tolerance       PT Treatment Interventions DME instruction;Therapeutic exercise;Functional mobility training;Therapeutic activities;Patient/family education    PT Goals (Current goals can be found in the Care Plan section)  Acute Rehab PT Goals PT Goal Formulation: With patient/family Time For Goal Achievement: 10/13/16 Potential to Achieve Goals: Good    Frequency Min 2X/week   Barriers to discharge        Co-evaluation               End of Session Equipment Utilized During Treatment: Oxygen Activity Tolerance: Patient tolerated treatment well Patient left: in bed;with call bell/phone within reach;with family/visitor present;with bed alarm set   PT Visit Diagnosis: Other abnormalities of  gait and mobility (R26.89)         Time: 6967-8938 PT Time Calculation (min) (ACUTE ONLY): 30 min   Charges:   PT Evaluation $PT Eval Moderate Complexity: 1 Procedure     PT G Codes:         Shai Rasmussen,KATHrine E 09/29/2016, 1:01 PM Carmelia Bake, PT, DPT 09/29/2016 Pager: [phone]

## 2016-09-29 NOTE — Progress Notes (Addendum)
Subjective: Pt CVA, paraplegia, dysphonia, contracture left hand and arm, decrease use of his right.  incontinent and wet currently.  He understands but has very limited speech ability.   He still has some discomfort, more LLQ but seems to be better.  Very congested cough, but he can clear it with directed effort.  Nurse will get him IS and will have to help him with it.  Objective: Vital signs in last 24 hours: Temp:  [97.4 F (36.3 C)-98.8 F (37.1 C)] 97.4 F (36.3 C) (02/19 0800) Pulse Rate:  [73-98] 81 (02/19 0800) Resp:  [6-19] 15 (02/19 0800) BP: (118-153)/(48-80) 143/80 (02/19 0800) SpO2:  [89 %-98 %] 97 % (02/19 0800) Last BM Date:  (PTA pt uncertain) 6789 IV 381 urine Afebrile, VSS WBC is still up 24.5 H/H is dow some with hydration Creatinine is better, K+ 5.0  Intake/Output from previous day: 02/18 0701 - 02/19 0700 In: 2500 [I.V.:2300; IV Piggyback:200] Out: 975 [Urine:975] Intake/Output this shift: No intake/output data recorded.  General appearance: alert, cooperative and no distress Neck: no adenopathy, no carotid bruit, no JVD, supple, symmetrical, trachea midline, thyroid not enlarged, symmetric, no tenderness/mass/nodules and trach site open Resp: bilateral ronchi, he can clear with cough  GI: soft, not distended, still tender LLQ.  few BS.  Lab Results:   Recent Labs  09/27/16 1823 09/28/16 0333  WBC 26.2* 24.5*  HGB 13.7 11.5*  HCT 42.0 35.5*  PLT 302 269    BMET  Recent Labs  09/27/16 1823 09/28/16 0333  NA 139 139  K 4.8 5.0  CL 113* 114*  CO2 17* 19*  GLUCOSE 119* 107*  BUN 31* 29*  CREATININE 1.92* 1.64*  CALCIUM 9.2 8.3*   PT/INR No results for input(s): LABPROT, INR in the last 72 hours.   Recent Labs Lab 09/27/16 1823  AST 20  ALT 27  ALKPHOS 70  BILITOT 0.6  PROT 7.9  ALBUMIN 3.5     Lipase     Component Value Date/Time   LIPASE 22 09/27/2016 1823     Studies/Results: Ct Abdomen Pelvis Wo  Contrast  Result Date: 09/27/2016 CLINICAL DATA:  61 year old male with left lower quadrant abdominal pain since this morning. Nausea. History of renal cell carcinoma status post left nephrectomy. EXAM: CT ABDOMEN AND PELVIS WITHOUT CONTRAST TECHNIQUE: Multidetector CT imaging of the abdomen and pelvis was performed following the standard protocol without IV contrast. COMPARISON:  Multiple priors, most recently PET-CT 12/25/2015. FINDINGS: Lower chest: 8 mm pulmonary nodule in the left lower lobe (image 35 of series 7), similar to the prior PET-CT. Extensive scarring in the visualize lung bases. Aortic atherosclerosis. Hepatobiliary: No definite cystic or solid hepatic lesions are noted. Status post cholecystectomy. Pancreas: No definite pancreatic mass or peripancreatic inflammatory changes are noted on today's noncontrast CT examination. Spleen: 4.4 cm subtle low-attenuation lesion in the periphery of the spleen is similar to several prior examinations, potentially a metastatic lesion. Adrenals/Urinary Tract: Status post left nephrectomy. Areas of fat necrosis are again noted in the nephrectomy bed. 9 mm high attenuation lesion extending exophytically from the lower pole of the right kidney is similar to prior studies, incompletely characterized on today's noncontrast CT examination, but likely to represent a small proteinaceous/hemorrhagic cysts. Right adrenal gland is unremarkable in appearance although there is a small adjacent peritoneal implant. Enlarging lesion in the left adrenal gland which currently measures 2.1 x 2.3 cm, concerning for metastatic lesion. No hydroureteronephrosis on the right side. Urinary bladder is unremarkable  in appearance. Stomach/Bowel: Unenhanced appearance of the stomach is normal. There is no pathologic dilatation of small bowel or colon. Numerous colonic diverticulae are noted. Notably, in the proximal sigmoid colon there is extensive inflammation around several diverticulae  where there is also some adjacent extraluminal gas, concerning for an acute diverticulitis with frank perforation. This is best appreciated on axial images 87 and 88 of series 2. In addition, there is a small amount of extraluminal fluid best appreciated on axial image 73 of series 2 and coronal image 59 of series 4 where this measures 1.5 x 2.3 x 3.6 cm, likely to reflect a developing diverticular abscess. Normal appendix. Vascular/Lymphatic: Aortic atherosclerosis, without evidence of aneurysm in the abdominal or pelvic vasculature. No lymphadenopathy noted in the abdomen or pelvis. Reproductive: Prostate gland and seminal vesicles are unremarkable in appearance. Other: Several small locules of extraluminal gas are noted throughout the peritoneal cavity. Multiple soft tissue masses are noted throughout the peritoneal cavity. The largest of these is located centrally and has increased in size compared to the prior examinations measuring up to 6.0 x 8.7 cm (axial image 52 of series 2) on today's examination versus 4.8 x 5.3 cm on prior PET-CT 12/25/2015. Another large lesion is also noted in the right pericolic gutter measuring up to 5.6 x 4.8 cm (axial image 53 of series 2). Several other smaller soft tissue lesions are also noted elsewhere throughout the peritoneal cavity, compatible with progressive peritoneal disease. No significant volume of ascites. Musculoskeletal: There are no aggressive appearing lytic or blastic lesions noted in the visualized portions of the skeleton. IMPRESSION: 1. Acute perforated diverticulitis of the proximal sigmoid colon. Small volume of pneumoperitoneum noted. Small volume of extra luminal fluid adjacent to the site of perforation likely reflects a developing diverticular abscess. Surgical consultation is strongly recommended. 2. Progression of metastatic disease, including worsening intraperitoneal metastatic lesions and enlarging left adrenal lesion, as detailed above. Splenic  lesion appears very similar to prior examinations. 3. Aortic atherosclerosis. 4. Additional incidental findings, as above. Critical Value/emergent results were called by telephone at the time of interpretation on 09/27/2016 at 9:30 pm to Dr. Addison Lank, who verbally acknowledged these results. Electronically Signed   By: Vinnie Langton M.D.   On: 09/27/2016 21:31   Prior to Admission medications   Medication Sig Start Date End Date Taking? Authorizing Provider  albuterol (PROVENTIL HFA;VENTOLIN HFA) 108 (90 BASE) MCG/ACT inhaler Inhale 2 puffs into the lungs every 6 (six) hours as needed for wheezing or shortness of breath. 07/07/15  Yes Debbrah Alar, NP  atorvastatin (LIPITOR) 20 MG tablet Take 20 mg by mouth daily.   Yes Historical Provider, MD  clopidogrel (PLAVIX) 75 MG tablet Take 75 mg by mouth at bedtime.   Yes Historical Provider, MD  escitalopram (LEXAPRO) 10 MG tablet Take 10 mg by mouth daily.   Yes Historical Provider, MD  ketoconazole (NIZORAL) 2 % cream Apply 1 application topically 2 (two) times daily as needed for irritation.   Yes Historical Provider, MD  lisinopril-hydrochlorothiazide (PRINZIDE,ZESTORETIC) 20-12.5 MG tablet Take 1 tablet by mouth daily.   Yes Historical Provider, MD  metFORMIN (GLUCOPHAGE) 500 MG tablet Take 500 mg by mouth at bedtime.    Yes Historical Provider, MD  Multiple Vitamin (MULTIVITAMIN WITH MINERALS) TABS tablet Take 1 tablet by mouth daily.   Yes Historical Provider, MD  testosterone enanthate (DELATESTRYL) 200 MG/ML injection Inject 200 mg into the muscle every 14 (fourteen) days. For IM use only   Yes  Historical Provider, MD  traMADol (ULTRAM) 50 MG tablet Take 50 mg by mouth every 6 (six) hours as needed for moderate pain.   Yes Historical Provider, MD  traZODone (DESYREL) 100 MG tablet Take 100 mg by mouth at bedtime as needed for sleep.   Yes Historical Provider, MD  triamcinolone cream (KENALOG) 0.1 % Apply 1 application topically 2 (two)  times daily as needed (for rash).   Yes Historical Provider, MD  trimethoprim (TRIMPEX) 100 MG tablet Take 100 mg by mouth daily.   Yes Historical Provider, MD     Medications: . escitalopram  10 mg Oral Daily  . lip balm  1 application Topical BID  . mouth rinse  15 mL Mouth Rinse BID  . meropenem (MERREM) IV  1 g Intravenous Q12H  . pantoprazole  40 mg Oral Q1200  . trimethoprim  100 mg Oral Daily   . sodium chloride 100 mL/hr at 09/29/16 0037  . sodium chloride      Assessment/Plan Sigmoid diverticulitis with perforation 09/27/16  On Merrem  Paraplegic from CVA 25 years ago - chronic Plavix Rx - on hold Recurrent Metastatic renal cell cancer/s/p left radical nephrectomy 2013/peritoneal metastasis 2015 Renal Insuffiencey  AODM Pituitary macroadenoma Sleep apnea  Hypertension Hypogonadism FEN: IV fluids/NPO ID:  Meropenem 09/27/16  =>> day 3 DVT:  SCD   Plan:  Continue antibiotics, get PT and OT involved and keep him as active and paraplegia/contractures/ chronic debilitation will allow.       LOS: 2 days    [name],Austin 09/29/2016 [phone]  Agree with above.  Wife and daughter, Nira Conn, in room with patient.  He has two other children, but I think that they live in [place].  He was treated with antibiotics for a bladder infection by Dr. [name] from 2/6 - 2/13.  Followed by Dr. Dahlia Byes for his renal cell ca.  I spoke to Dr. Alen Blew about the patient.  He has a slow growing recurrence. Dr. Tresa Moore did his original left nephrectomy in 2013, but has not seen him in years. He has also had a pituitary tumor treated in 1994. His PCP is Dr. Girtha Hake.  Maybe better, but not sure. His speech pattern is a little hard to follow.  Alphonsa Overall, MD, [facility] Surgery Pager: [phone] Office phone:  [phone]

## 2016-09-29 NOTE — Progress Notes (Addendum)
Date:  September 29, 2016 Chart reviewed for concurrent status and case management needs.  [id number] greater than 24, abd. Infection, pmh-cva, paraplegic, left arm and hand shrinkage.  Lives at home with wife and children. Will continue to follow patient progress. Discharge Planning: following for needs Expected discharge date: [id number] Velva Harman, BSN, [place], [place]

## 2016-09-29 NOTE — Progress Notes (Signed)
Pharmacy Antibiotic Note  Austin [name] is a 61 y.o. male admitted on 09/27/2016 with Intra-abdominal infection .  Pharmacy has been consulted for meropenem dosing.  Since SCr has improved and CrCl now > 80m/min, will adjust dosing interval.  Plan:  Increase meropenem to 1g IV q8h  Continue to follow renal function, cultures and clinical course  Height: 6' (182.9 cm) Weight: 173 lb 11.6 oz (78.8 kg) IBW/kg (Calculated) : 77.6  Temp (24hrs), Avg:98.1 F (36.7 C), Min:97.4 F (36.3 C), Max:98.8 F (37.1 C)   Recent Labs Lab 09/27/16 1823 09/27/16 1832 09/27/16 2332 09/28/16 0333  WBC 26.2*  --   --  24.5*  CREATININE 1.92*  --   --  1.64*  LATICACIDVEN  --  1.45 1.53  --     Estimated Creatinine Clearance: 51.3 mL/min (by C-G formula based on SCr of 1.64 mg/dL (H)).    Allergies  Allergen Reactions  . Penicillins Rash and Other (See Comments)    Has patient had a PCN reaction causing immediate rash, facial/tongue/throat swelling, SOB or lightheadedness with hypotension: No Has patient had a PCN reaction causing severe rash involving mucus membranes or skin necrosis: No Has patient had a PCN reaction that required hospitalization No Has patient had a PCN reaction occurring within the last 10 years: No If all of the above answers are "NO", then may proceed with Cephalosporin use.    Antimicrobials this admission:  2/17 Cipro/Flagyl x 1 2/18 Meropenem >>  Dose adjustments this admission:  2/19 increase meropenem to 1g q8h for improved SCr  Microbiology results:  2/17 BCx: sent 2/18 MRSA PCR: neg  Thank you for allowing pharmacy to be a part of this patient's care.  EPeggyann Juba PharmD, BCPS Pager: [phone]/19/2018 10:07 AM

## 2016-09-29 NOTE — Progress Notes (Signed)
PHARMACY CONSULT: Lovenox for VTE prophylaxis   Wt: 78.8 kg BMI:  23.5 Scr:  1.64 CrCl >30 ml/hr  H/H: 11.5/35.5 Pltc: 269  A/P:  Since BMI < 30 and CrCl > 30 ml/min, patient is appropriate for standard dose prophylaxis dosing of '40mg'$  SQ q24h  No further dosing adjustments needed, Pharmacy will sign off  Peggyann Juba, PharmD, BCPS Pager: [phone]  09/29/2016 9:14 AM

## 2016-09-30 ENCOUNTER — Telehealth: Payer: Self-pay | Admitting: Family Medicine

## 2016-09-30 DIAGNOSIS — C649 Malignant neoplasm of unspecified kidney, except renal pelvis: Secondary | ICD-10-CM

## 2016-09-30 DIAGNOSIS — C786 Secondary malignant neoplasm of retroperitoneum and peritoneum: Secondary | ICD-10-CM

## 2016-09-30 DIAGNOSIS — K5720 Diverticulitis of large intestine with perforation and abscess without bleeding: Principal | ICD-10-CM

## 2016-09-30 LAB — CBC
HCT: 33 % — ABNORMAL LOW (ref 39.0–52.0)
Hemoglobin: 10.4 g/dL — ABNORMAL LOW (ref 13.0–17.0)
MCH: 27.7 pg (ref 26.0–34.0)
MCHC: 31.5 g/dL (ref 30.0–36.0)
MCV: 87.8 fL (ref 78.0–100.0)
PLATELETS: 245 10*3/uL (ref 150–400)
RBC: 3.76 MIL/uL — ABNORMAL LOW (ref 4.22–5.81)
RDW: 16.4 % — AB (ref 11.5–15.5)
WBC: 14.4 10*3/uL — ABNORMAL HIGH (ref 4.0–10.5)

## 2016-09-30 LAB — BASIC METABOLIC PANEL
Anion gap: 7 (ref 5–15)
BUN: 27 mg/dL — AB (ref 6–20)
CALCIUM: 8.1 mg/dL — AB (ref 8.9–10.3)
CO2: 17 mmol/L — ABNORMAL LOW (ref 22–32)
Chloride: 119 mmol/L — ABNORMAL HIGH (ref 101–111)
Creatinine, Ser: 1.33 mg/dL — ABNORMAL HIGH (ref 0.61–1.24)
GFR calc Af Amer: >60 mL/min (ref >60)
GFR, EST NON AFRICAN AMERICAN: 56 mL/min — AB (ref >60)
GLUCOSE: 69 mg/dL (ref 65–99)
Potassium: 4 mmol/L (ref 3.5–5.1)
Sodium: 143 mmol/L (ref 135–145)

## 2016-09-30 LAB — GLUCOSE, CAPILLARY
Glucose-Capillary: 65 mg/dL (ref 65–99)
Glucose-Capillary: 118 mg/dL — ABNORMAL HIGH (ref 65–99)

## 2016-09-30 MED ORDER — KCL IN DEXTROSE-NACL 20-5-0.45 MEQ/L-%-% IV SOLN
INTRAVENOUS | Status: DC
  Administered 2016-09-30 – 2016-10-02 (×7): via INTRAVENOUS
  Administered 2016-10-03 (×3): 125 mL/h via INTRAVENOUS
  Administered 2016-10-04: 22:00:00 via INTRAVENOUS
  Administered 2016-10-04: 1000 mL via INTRAVENOUS
  Administered 2016-10-06: 14:00:00 via INTRAVENOUS
  Administered 2016-10-07: 50 mL/h via INTRAVENOUS
  Administered 2016-10-08 – 2016-10-09 (×3): via INTRAVENOUS
  Filled 2016-09-30 (×19): qty 1000

## 2016-09-30 MED ORDER — DEXTROSE 50 % IV SOLN
INTRAVENOUS | Status: AC
  Filled 2016-09-30: qty 50

## 2016-09-30 MED ORDER — DEXTROSE 50 % IV SOLN
25.0000 mL | Freq: Once | INTRAVENOUS | Status: AC
  Administered 2016-09-30: 25 mL via INTRAVENOUS

## 2016-09-30 NOTE — Telephone Encounter (Signed)
Pts wife state that the pt is in the hospital and will not be in the hospital from 7-10 days they have him on a bowel rest before they do a CT.  His surgeon is Dr. Lucia Gaskins and Dr. Alen Blew state the the cyst will not effect the surgery.  Dr. [name] can call and speak with the wife if you would like pt is at [place].   Pt has jury duty in April and would like to have a detail note stating that he is not able to take part in it.

## 2016-09-30 NOTE — Progress Notes (Signed)
OT Cancellation Note  Patient Details Name: Austin [name] MRN: [id number] DOB: 22-Sep-1955   Cancelled Treatment:    Reason Eval/Treat Not Completed: Other (comment). Spoke to pt/wife. Wife feels she will be able to manage SPT at home.  She performs ADLs for pt and places fraction pan in w/c for toileting.  I do not expect to complete eval at this time, but left my name on the board for nursing to page me, if wife feels she would benefit from my assistance.    Demitria Hay 09/30/2016, 3:48 PM  Lesle Chris, OTR/L [phone] 09/30/2016

## 2016-09-30 NOTE — Telephone Encounter (Signed)
Next step would be to get pt scheduled for a hospital follow up. Can you please set this up. See annotations below. Thank you.

## 2016-09-30 NOTE — Progress Notes (Signed)
PCP - Dr. Girtha Hake Oncology - Dr. Dahlia Byes  Subjective: Pt CVA, paraplegia, dysphonia, contracture left hand and arm, decrease use of his right.  incontinent and wet currently.  He understands but has very limited speech ability.   Still with LLQ pain - maybe more focal.  Not nauseated, feels like he needs to have a BM Wife and daughter, Nira Conn, in room.  Objective: Vital signs in last 24 hours: Temp:  [97.3 F (36.3 C)-98.7 F (37.1 C)] 98.1 F (36.7 C) (02/20 0741) Pulse Rate:  [62-85] 85 (02/20 1000) Resp:  [0-30] 19 (02/20 1000) BP: (122-170)/(43-81) 130/64 (02/20 1000) SpO2:  [89 %-96 %] 94 % (02/20 1000) Last BM Date:  (UTA, pt unsure)  Intake/Output from previous day: 02/19 0701 - 02/20 0700 In: 2488.3 [I.V.:2288.3; IV Piggyback:200] Out: 600 [Urine:600] Intake/Output this shift: Total I/O In: 390 [I.V.:390] Out: -   General appearance: alert, cooperative and no distress.  Wearing his glasses today. Neck: no adenopathy, no carotid bruit, no JVD, supple, symmetrical, trachea midline, thyroid not enlarged, symmetric, no tenderness/mass/nodules and trach site open Resp: bilateral ronchi, he can pull about 700 cc on IS GI: soft, not distended, Tender LLQ.  few BS.  Lab Results:   Recent Labs  09/28/16 0333 09/30/16 0344  WBC 24.5* 14.4*  HGB 11.5* 10.4*  HCT 35.5* 33.0*  PLT 269 245    BMET  Recent Labs  09/28/16 0333 09/30/16 0344  NA 139 143  K 5.0 4.0  CL 114* 119*  CO2 19* 17*  GLUCOSE 107* 69  BUN 29* 27*  CREATININE 1.64* 1.33*  CALCIUM 8.3* 8.1*   PT/INR No results for input(s): LABPROT, INR in the last 72 hours.   Recent Labs Lab 09/27/16 1823  AST 20  ALT 27  ALKPHOS 70  BILITOT 0.6  PROT 7.9  ALBUMIN 3.5     Lipase     Component Value Date/Time   LIPASE 22 09/27/2016 1823     Studies/Results: No results found. Prior to Admission medications   Medication Sig Start Date End Date Taking? Authorizing Provider   albuterol (PROVENTIL HFA;VENTOLIN HFA) 108 (90 BASE) MCG/ACT inhaler Inhale 2 puffs into the lungs every 6 (six) hours as needed for wheezing or shortness of breath. 07/07/15  Yes Debbrah Alar, NP  atorvastatin (LIPITOR) 20 MG tablet Take 20 mg by mouth daily.   Yes Historical Provider, MD  clopidogrel (PLAVIX) 75 MG tablet Take 75 mg by mouth at bedtime.   Yes Historical Provider, MD  escitalopram (LEXAPRO) 10 MG tablet Take 10 mg by mouth daily.   Yes Historical Provider, MD  ketoconazole (NIZORAL) 2 % cream Apply 1 application topically 2 (two) times daily as needed for irritation.   Yes Historical Provider, MD  lisinopril-hydrochlorothiazide (PRINZIDE,ZESTORETIC) 20-12.5 MG tablet Take 1 tablet by mouth daily.   Yes Historical Provider, MD  metFORMIN (GLUCOPHAGE) 500 MG tablet Take 500 mg by mouth at bedtime.    Yes Historical Provider, MD  Multiple Vitamin (MULTIVITAMIN WITH MINERALS) TABS tablet Take 1 tablet by mouth daily.   Yes Historical Provider, MD  testosterone enanthate (DELATESTRYL) 200 MG/ML injection Inject 200 mg into the muscle every 14 (fourteen) days. For IM use only   Yes Historical Provider, MD  traMADol (ULTRAM) 50 MG tablet Take 50 mg by mouth every 6 (six) hours as needed for moderate pain.   Yes Historical Provider, MD  traZODone (DESYREL) 100 MG tablet Take 100 mg by mouth at bedtime as needed  for sleep.   Yes Historical Provider, MD  triamcinolone cream (KENALOG) 0.1 % Apply 1 application topically 2 (two) times daily as needed (for rash).   Yes Historical Provider, MD  trimethoprim (TRIMPEX) 100 MG tablet Take 100 mg by mouth daily.   Yes Historical Provider, MD     Medications: . dextrose      . enoxaparin (LOVENOX) injection  40 mg Subcutaneous Q24H  . escitalopram  10 mg Oral Daily  . lip balm  1 application Topical BID  . mouth rinse  15 mL Mouth Rinse BID  . meropenem (MERREM) IV  1 g Intravenous Q8H  . pantoprazole  40 mg Oral Q1200  . trimethoprim   100 mg Oral Daily   . sodium chloride 100 mL/hr at 09/30/16 1000    Assessment/Plan Sigmoid diverticulitis with perforation 09/27/16  On Merrem - 2/17 >>>  WBC - 14,400 - 09/30/2016  Paraplegic from CVA 25 years ago - chronic Plavix Rx - on hold Recurrent Metastatic renal cell cancer/s/p left radical nephrectomy 2013/peritoneal metastasis 2015 Renal Insuffiencey   Creat - 1.33 - 09/30/2106 AODM Pituitary macroadenoma Sleep apnea  Hypertension Hypogonadism FEN: IV fluids/NPO ID:  Meropenem 09/27/16  =>> day 3 DVT:  SCD   Plan:  Continue antibiotics,   PT and OT involved and keep him as active and paraplegia/contractures/ chronic debilitation will allow.    To transfer out of step down     LOS: 3 days   Alphonsa Overall, MD, [facility] Surgery Pager: [phone] Office phone:  [phone]

## 2016-09-30 NOTE — Progress Notes (Signed)
IP PROGRESS NOTE  Subjective:   Austin [name] is known to me with history of renal cell carcinoma with progressive metastasis since September 2013. He was treated with multiagent briefly and therapy discontinued in 2015 for tolerance. Despite being off treatment, his disease has progressed rather slowly but certainly has over the years.  He was hospitalized with diverticulitis and perforation and abscess formation. He is currently being treated with conservative measures and attempt to avoid surgery.  Clinically, he feels reasonably fair although he still have abdominal pain. He denies any other symptoms. He denies any chest pain or palpitation. He denied any fevers.  Objective:  Vital signs in last 24 hours: Temp:  [97.3 F (36.3 C)-98.7 F (37.1 C)] 98.3 F (36.8 C) (02/20 0332) Pulse Rate:  [62-97] 73 (02/20 0600) Resp:  [0-30] 19 (02/20 0600) BP: (122-170)/(43-71) 170/71 (02/20 0809) SpO2:  [86 %-96 %] 89 % (02/20 0600) Weight change:  Last BM Date:  (UTA, pt unsure)  Intake/Output from previous day: 02/19 0701 - 02/20 0700 In: 2488.3 [I.V.:2288.3; IV Piggyback:200] Out: 600 [Urine:600] Alert, awake gentleman appeared without distress this morning. Mouth: mucous membranes moist, pharynx normal without lesions Resp: clear to auscultation bilaterally Cardio: regular rate and rhythm, S1, S2 normal, no murmur, click, rub or gallop GI: Soft, with tender on palpation. Good bowel sounds without rebound. Extremities: extremities normal, atraumatic, no cyanosis or edema  Portacath/PICC-without erythema  Lab Results:  Recent Labs  09/28/16 0333 09/30/16 0344  WBC 24.5* 14.4*  HGB 11.5* 10.4*  HCT 35.5* 33.0*  PLT 269 245    BMET  Recent Labs  09/28/16 0333 09/30/16 0344  NA 139 143  K 5.0 4.0  CL 114* 119*  CO2 19* 17*  GLUCOSE 107* 69  BUN 29* 27*  CREATININE 1.64* 1.33*  CALCIUM 8.3* 8.1*    Studies/Results:  IMPRESSION: 1. Acute perforated diverticulitis  of the proximal sigmoid colon. Small volume of pneumoperitoneum noted. Small volume of extra luminal fluid adjacent to the site of perforation likely reflects a developing diverticular abscess. Surgical consultation is strongly recommended. 2. Progression of metastatic disease, including worsening intraperitoneal metastatic lesions and enlarging left adrenal lesion, as detailed above. Splenic lesion appears very similar to prior examinations. 3. Aortic atherosclerosis. 4. Additional incidental findings, as above. Critical Value/emergent results were called by telephone at the time of interpretation on 09/27/2016 at 9:30 pm to Dr. Addison Lank, who verbally acknowledged these results.   Medications: I have reviewed the patient's current medications.  Assessment/Plan:  61 year old gentleman with the following issues:  1. Renal cell carcinoma that has been progressive since 2015. He has opted not to receive treatment and despite that, his disease have progressed slowly and had not impacted his overall quality of life.  Despite the slow progression of disease, he still has an incurable malignancy that will eventually will become problematic and I'll and ultimately lead to his death. But on this rate, this will not happen in the immediate future.   2. Diverticulitis with perforation: Currently receiving intravenous antibiotics and attempt to avoid primary surgical therapy.  This is a certainly a difficult decision because undergoing such a major operation will probably affect his quality of life moving forward with slow and potentially complicated recovery. On the other hand, without operation he might not recover from this episode.  The decision to proceed with surgery will be a complicated one for the patient and his family. Especially in the setting of an incurable malignancy that is not immediately  life threatening.  It would be also completely reasonable if he is to forego surgery  altogether and proceed with hospice if his condition worsens.  This case was discussed with Dr. Lucia Gaskins and I'm happy to help in any way possible in the future.   LOS: 3 days   Austin [name] 09/30/2016, 8:18 AM

## 2016-09-30 NOTE — Telephone Encounter (Signed)
Pt has been admitted to the hospital this weekend for perforated bowel fever 101.5.  Wife would to have a call back to see what is the next steps to take.

## 2016-09-30 NOTE — Telephone Encounter (Signed)
FYI--please review.

## 2016-09-30 NOTE — Telephone Encounter (Signed)
Yes.   We may be looking at [facility] referral soon (with his metastatic renal cancer) but I would get input of oncologist first.  If he can come in office follow up is first step.

## 2016-09-30 NOTE — Telephone Encounter (Signed)
Please review. Should we just get him in for a hospital follow up?

## 2016-09-30 NOTE — Progress Notes (Signed)
Pt stated he felt shaky. Checked BS and it was 65. Placed hypoglycemia protocol order and administered 25 ml of D50%. Will recheck BS in 15 min. Will continue to monitor pt closely.

## 2016-09-30 NOTE — Care Management Note (Signed)
Case Management Note  Patient Details  Name: Austin [name] MRN: [id number] Date of Birth: 15-Feb-1956  Subjective/Objective:       Perforated diverticulitis, hx of cva x 25 years ago, renal cell ca          Action/Plan: Home self care Expected Discharge Date:                  Expected Discharge Plan:  Home/Self Care  In-House Referral:     Discharge planning Services     Post Acute Care Choice:    Choice offered to:     DME Arranged:    DME Agency:     HH Arranged:    HH Agency:     Status of Service:  In process, will continue to follow  If discussed at Long Length of Stay Meetings, dates discussed:    Additional Comments:  Leeroy Cha, RN 09/30/2016, 10:49 AM

## 2016-10-01 LAB — CBC WITH DIFFERENTIAL/PLATELET
BASOS PCT: 0 %
Basophils Absolute: 0 10*3/uL (ref 0.0–0.1)
Eosinophils Absolute: 0.3 10*3/uL (ref 0.0–0.7)
Eosinophils Relative: 3 %
HEMATOCRIT: 31.4 % — AB (ref 39.0–52.0)
HEMOGLOBIN: 10.1 g/dL — AB (ref 13.0–17.0)
LYMPHS PCT: 7 %
Lymphs Abs: 0.7 10*3/uL (ref 0.7–4.0)
MCH: 27.1 pg (ref 26.0–34.0)
MCHC: 32.2 g/dL (ref 30.0–36.0)
MCV: 84.2 fL (ref 78.0–100.0)
MONO ABS: 1.4 10*3/uL — AB (ref 0.1–1.0)
MONOS PCT: 14 %
NEUTROS PCT: 76 %
Neutro Abs: 7.6 10*3/uL (ref 1.7–7.7)
Platelets: 265 10*3/uL (ref 150–400)
RBC: 3.73 MIL/uL — ABNORMAL LOW (ref 4.22–5.81)
RDW: 16.1 % — ABNORMAL HIGH (ref 11.5–15.5)
WBC: 9.9 10*3/uL (ref 4.0–10.5)

## 2016-10-01 LAB — BASIC METABOLIC PANEL
ANION GAP: 4 — AB (ref 5–15)
BUN: 23 mg/dL — ABNORMAL HIGH (ref 6–20)
CHLORIDE: 118 mmol/L — AB (ref 101–111)
CO2: 19 mmol/L — AB (ref 22–32)
CREATININE: 1.17 mg/dL (ref 0.61–1.24)
Calcium: 7.9 mg/dL — ABNORMAL LOW (ref 8.9–10.3)
GFR calc non Af Amer: >60 mL/min (ref >60)
GLUCOSE: 126 mg/dL — AB (ref 65–99)
Potassium: 3.9 mmol/L (ref 3.5–5.1)
Sodium: 141 mmol/L (ref 135–145)

## 2016-10-01 NOTE — Progress Notes (Signed)
PCP - Dr. Girtha Hake Oncology - Dr. Dahlia Byes  Subjective: Pt CVA, paraplegia, dysphonia, contracture left hand and arm, decrease use of his right.   He understands but has very limited speech ability.   Still with LLQ pain, but better. Not nauseated.  Objective: Vital signs in last 24 hours: Temp:  [97.9 F (36.6 C)-99.2 F (37.3 C)] 99.1 F (37.3 C) (02/21 0513) Pulse Rate:  [64-85] 64 (02/21 0513) Resp:  [16-24] 16 (02/21 0513) BP: (129-170)/(46-81) 143/66 (02/21 0513) SpO2:  [92 %-98 %] 92 % (02/21 0513) Last BM Date: 09/30/16  Intake/Output from previous day: 02/20 0701 - 02/21 0700 In: 3104.6 [I.V.:2804.6; IV Piggyback:300] Out: 1100 [Urine:1100] Intake/Output this shift: No intake/output data recorded.  General appearance: alert, cooperative and no distress.  Wearing his glasses today. Neck: no adenopathy, no carotid bruit, no JVD, supple, symmetrical, trachea midline, thyroid not enlarged, symmetric, no tenderness/mass/nodules and trach site open Resp: Lungs are clear and symmetric GI: soft, not distended, Less Tender LLQ.  Active BS.  Lab Results:   Recent Labs  09/30/16 0344 10/01/16 0528  WBC 14.4* 9.9  HGB 10.4* 10.1*  HCT 33.0* 31.4*  PLT 245 265    BMET  Recent Labs  09/30/16 0344 10/01/16 0528  NA 143 141  K 4.0 3.9  CL 119* 118*  CO2 17* 19*  GLUCOSE 69 126*  BUN 27* 23*  CREATININE 1.33* 1.17  CALCIUM 8.1* 7.9*   PT/INR No results for input(s): LABPROT, INR in the last 72 hours.   Recent Labs Lab 09/27/16 1823  AST 20  ALT 27  ALKPHOS 70  BILITOT 0.6  PROT 7.9  ALBUMIN 3.5     Lipase     Component Value Date/Time   LIPASE 22 09/27/2016 1823     Studies/Results: No results found. Prior to Admission medications   Medication Sig Start Date End Date Taking? Authorizing Provider  albuterol (PROVENTIL HFA;VENTOLIN HFA) 108 (90 BASE) MCG/ACT inhaler Inhale 2 puffs into the lungs every 6 (six) hours as needed for wheezing  or shortness of breath. 07/07/15  Yes Debbrah Alar, NP  atorvastatin (LIPITOR) 20 MG tablet Take 20 mg by mouth daily.   Yes Historical Provider, MD  clopidogrel (PLAVIX) 75 MG tablet Take 75 mg by mouth at bedtime.   Yes Historical Provider, MD  escitalopram (LEXAPRO) 10 MG tablet Take 10 mg by mouth daily.   Yes Historical Provider, MD  ketoconazole (NIZORAL) 2 % cream Apply 1 application topically 2 (two) times daily as needed for irritation.   Yes Historical Provider, MD  lisinopril-hydrochlorothiazide (PRINZIDE,ZESTORETIC) 20-12.5 MG tablet Take 1 tablet by mouth daily.   Yes Historical Provider, MD  metFORMIN (GLUCOPHAGE) 500 MG tablet Take 500 mg by mouth at bedtime.    Yes Historical Provider, MD  Multiple Vitamin (MULTIVITAMIN WITH MINERALS) TABS tablet Take 1 tablet by mouth daily.   Yes Historical Provider, MD  testosterone enanthate (DELATESTRYL) 200 MG/ML injection Inject 200 mg into the muscle every 14 (fourteen) days. For IM use only   Yes Historical Provider, MD  traMADol (ULTRAM) 50 MG tablet Take 50 mg by mouth every 6 (six) hours as needed for moderate pain.   Yes Historical Provider, MD  traZODone (DESYREL) 100 MG tablet Take 100 mg by mouth at bedtime as needed for sleep.   Yes Historical Provider, MD  triamcinolone cream (KENALOG) 0.1 % Apply 1 application topically 2 (two) times daily as needed (for rash).   Yes Historical Provider, MD  trimethoprim (TRIMPEX) 100 MG tablet Take 100 mg by mouth daily.   Yes Historical Provider, MD     Medications: . enoxaparin (LOVENOX) injection  40 mg Subcutaneous Q24H  . escitalopram  10 mg Oral Daily  . lip balm  1 application Topical BID  . mouth rinse  15 mL Mouth Rinse BID  . meropenem (MERREM) IV  1 g Intravenous Q8H  . pantoprazole  40 mg Oral Q1200  . trimethoprim  100 mg Oral Daily   . 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    Now on [place]     LOS: 4 days   Alphonsa Overall, MD, [facility] Surgery Pager: [phone] Office phone:  [phone]

## 2016-10-01 NOTE — Telephone Encounter (Signed)
I spoke with wife and she brought me up to date.

## 2016-10-01 NOTE — Progress Notes (Signed)
Chaplain stopped in to visit with patient and his spouse.  Spouse shared with Chaplain regarding the patient's medical history.  Chaplain provided space and atmosphere for patient's spouse to share freely.  They are both grateful for the care they are receiving here at [facility].  They have strong spiritual support from their church ministry team.  Chaplain prayed with the patient and his spouse.  They welcomed prayer.     Chaplain provided spiritual support and prayer.  Lovely family.  [place] Resident   10/01/16 1028  Clinical Encounter Type  Visited With Patient and family together  Visit Type Initial;Spiritual support  Advance Directives (For Healthcare)  Does Patient Have a Medical Advance Directive? No  Mental Health Advance Directives  Does Patient Have a Mental Health Advance Directive? No

## 2016-10-01 NOTE — Care Management Important Message (Signed)
Important Message  Patient Details  Name: Overlea DUCRE MRN: [id number] Date of Birth: 29-Sep-1955   Medicare Important Message Given:  Yes    Kerin Salen 10/01/2016, 11:03 AMImportant Message  Patient Details  Name: AJAI HARVILLE MRN: [id number] Date of Birth: 04-Nov-1955   Medicare Important Message Given:  Yes    Kerin Salen 10/01/2016, 11:03 AM

## 2016-10-02 ENCOUNTER — Inpatient Hospital Stay (HOSPITAL_COMMUNITY): Payer: PPO

## 2016-10-02 ENCOUNTER — Encounter (HOSPITAL_COMMUNITY): Payer: Self-pay | Admitting: Radiology

## 2016-10-02 LAB — BASIC METABOLIC PANEL
Anion gap: 2 — ABNORMAL LOW (ref 5–15)
BUN: 18 mg/dL (ref 6–20)
CHLORIDE: 117 mmol/L — AB (ref 101–111)
CO2: 20 mmol/L — AB (ref 22–32)
CREATININE: 1.11 mg/dL (ref 0.61–1.24)
Calcium: 8.1 mg/dL — ABNORMAL LOW (ref 8.9–10.3)
GFR calc Af Amer: >60 mL/min (ref >60)
GFR calc non Af Amer: >60 mL/min (ref >60)
Glucose, Bld: 121 mg/dL — ABNORMAL HIGH (ref 65–99)
POTASSIUM: 4.4 mmol/L (ref 3.5–5.1)
Sodium: 139 mmol/L (ref 135–145)

## 2016-10-02 LAB — CBC WITH DIFFERENTIAL/PLATELET
Basophils Absolute: 0.1 10*3/uL (ref 0.0–0.1)
Basophils Relative: 1 %
Eosinophils Absolute: 0.5 10*3/uL (ref 0.0–0.7)
Eosinophils Relative: 5 %
HEMATOCRIT: 33.2 % — AB (ref 39.0–52.0)
HEMOGLOBIN: 10.9 g/dL — AB (ref 13.0–17.0)
LYMPHS PCT: 11 %
Lymphs Abs: 1.2 10*3/uL (ref 0.7–4.0)
MCH: 28 pg (ref 26.0–34.0)
MCHC: 32.8 g/dL (ref 30.0–36.0)
MCV: 85.3 fL (ref 78.0–100.0)
MONOS PCT: 12 %
Monocytes Absolute: 1.3 10*3/uL — ABNORMAL HIGH (ref 0.1–1.0)
NEUTROS ABS: 7.6 10*3/uL (ref 1.7–7.7)
NEUTROS PCT: 71 %
Platelets: 250 10*3/uL (ref 150–400)
RBC: 3.89 MIL/uL — AB (ref 4.22–5.81)
RDW: 16.2 % — ABNORMAL HIGH (ref 11.5–15.5)
WBC: 10.7 10*3/uL — ABNORMAL HIGH (ref 4.0–10.5)

## 2016-10-02 MED ORDER — SODIUM CHLORIDE 0.9 % IJ SOLN
INTRAMUSCULAR | Status: AC
  Filled 2016-10-02: qty 50

## 2016-10-02 MED ORDER — LIDOCAINE-EPINEPHRINE-TETRACAINE (LET) TOPICAL GEL: 3.0000 mL | Freq: Once | TOPICAL | Status: DC

## 2016-10-02 MED ORDER — IOPAMIDOL (ISOVUE-300) INJECTION 61%
100.0000 mL | Freq: Once | INTRAVENOUS | Status: AC | PRN
  Administered 2016-10-02: 100 mL via INTRAVENOUS

## 2016-10-02 MED ORDER — ENOXAPARIN SODIUM 40 MG/0.4ML ~~LOC~~ SOLN
40.0000 mg | SUBCUTANEOUS | Status: DC
  Administered 2016-10-04 – 2016-10-09 (×6): 40 mg via SUBCUTANEOUS
  Filled 2016-10-02 (×6): qty 0.4

## 2016-10-02 MED ORDER — IOPAMIDOL (ISOVUE-300) INJECTION 61%
INTRAVENOUS | Status: AC
  Filled 2016-10-02: qty 100

## 2016-10-02 MED ORDER — IOPAMIDOL (ISOVUE-300) INJECTION 61%
INTRAVENOUS | Status: AC
  Administered 2016-10-02: 15 mL
  Filled 2016-10-02: qty 30

## 2016-10-02 MED ORDER — IOPAMIDOL (ISOVUE-300) INJECTION 61%: 15.0000 mL | Freq: Once | INTRAVENOUS | Status: DC | PRN

## 2016-10-02 NOTE — Progress Notes (Signed)
PT Cancellation Note  Patient Details Name: Austin [name] MRN: [id number] DOB: May 20, 1956   Cancelled Treatment:     spoke with RN this morning and gave her my pager number.  ABD CT today would like to see pt after.  Checked on twice, pt having a hard time drinking all the contrast and having issues of loose BM's.  Was not till after 2pm pt was able to receive CT. Will check back another day as schedule permits.   Rica Koyanagi  PTA [facility]  Acute  Rehab Pager      [phone]

## 2016-10-02 NOTE — Progress Notes (Signed)
Pharmacy Antibiotic Note  Austin [name] is a 61 y.o. male admitted on 09/27/2016 with Intra-abdominal infection .  Pharmacy has been consulted for meropenem dosing.   Plan:  Continue meropenem to 1g IV q8h for CrCl > 74ms/min  Continue to follow renal function, cultures and clinical course  Height: 6' (182.9 cm) Weight: 173 lb 11.6 oz (78.8 kg) IBW/kg (Calculated) : 77.6  Temp (24hrs), Avg:97.9 F (36.6 C), Min:96.8 F (36 C), Max:98.5 F (36.9 C)   Recent Labs Lab 09/27/16 1823 09/27/16 1832 09/27/16 2332 09/28/16 0333 09/30/16 0344 10/01/16 0528 10/02/16 0500  WBC 26.2*  --   --  24.5* 14.4* 9.9 10.7*  CREATININE 1.92*  --   --  1.64* 1.33* 1.17 1.11  LATICACIDVEN  --  1.45 1.53  --   --   --   --     Estimated Creatinine Clearance: 75.7 mL/min (by C-G formula based on SCr of 1.11 mg/dL).    Allergies  Allergen Reactions  . Penicillins Rash and Other (See Comments)    Has patient had a PCN reaction causing immediate rash, facial/tongue/throat swelling, SOB or lightheadedness with hypotension: No Has patient had a PCN reaction causing severe rash involving mucus membranes or skin necrosis: No Has patient had a PCN reaction that required hospitalization No Has patient had a PCN reaction occurring within the last 10 years: No If all of the above answers are "NO", then may proceed with Cephalosporin use.    Antimicrobials this admission:  2/17 Cipro/Flagyl x 1 2/18 Meropenem >>  Dose adjustments this admission:  2/19 increase meropenem to 1g q8h for improved SCr  Microbiology results:  2/17 BCx: ngtd 2/18 MRSA PCR: neg  Thank you for allowing pharmacy to be a part of this patient's care.  EDolly RiasRPh 10/02/2016, 8:46 AM Pager [phone]

## 2016-10-02 NOTE — Progress Notes (Signed)
Initial Nutrition Assessment  DOCUMENTATION CODES:   Not applicable  INTERVENTION:   RD will order Premier Protein BID chocolate when diet advanced  NUTRITION DIAGNOSIS:   Inadequate oral intake related to inability to eat as evidenced by NPO status.  GOAL:   Patient will meet greater than or equal to 90% of their needs  MONITOR:   Diet advancement, Labs, Weight trends, Skin  REASON FOR ASSESSMENT:   LOS, NPO/Clear Liquid Diet    ASSESSMENT:   61 y/o male history of renal cell carcinoma with progressive metastasis since September 2013. He was treated with multiagent briefly and therapy discontinued in 2015 for tolerance. Admitted on 09/27/2016 with Intra-abdominal infection secondary to Sigmoid diverticulitis with perforation. Pt is paraplegic from CVA 25 years ago   Met with pt and wife in room today. Pt with history of CVA and unable to speak well. Pt's wife reports that pt is a good eater and was eating well up until a few days pta. Pt has h/o renal cancer and nephrectomy in 2013 which caused him  to loose down to 175lbs from 190lbs. Pt is currently weight stable per pt's wife. Pt currently NPO but reports that he is hungry and does have an appetite. Pt scheduled to have CT scan later today. Per pt's wife, there is a large renal mass that is preventing pt from having surgery at this time. Pt has had slow growing renal masses since 2013. Pt likes Premier Protein, RD will order when diet advanced.    Medications reviewed and include: lovenox, protonix, trimethoprim, fentanyl   Labs reviewed: Cl 117(H), C02 20(L), Ca 8.1(L) Wbc- 10.7(H)  Nutrition-Focused physical exam completed. Findings are no fat depletion, no muscle depletion, and mild edema.   Diet Order:  Diet NPO time specified Except for: Ice Chips  Skin:  Reviewed, no issues  Last BM:  2/21  Height:   Ht Readings from Last 1 Encounters:  09/28/16 6' (1.829 m)    Weight:   Wt Readings from Last 1  Encounters:  09/28/16 173 lb 11.6 oz (78.8 kg)    Ideal Body Weight:  76.9 kg (adjusted for paraplegia )  BMI:  Body mass index is 23.56 kg/m.  Estimated Nutritional Needs:   Kcal:  2000-2300kcal/day   Protein:  87-102g/day   Fluid:  >2L/day   EDUCATION NEEDS:   No education needs identified at this time  Koleen Distance, RD, LDN Pager #[phone]

## 2016-10-02 NOTE — Progress Notes (Signed)
Subjective: He has severe course breath sounds. I try to get him to cough and clear secretions but was not successful. He continues to have pain in his left lower quadrant on palpation.  Objective: Vital signs in last 24 hours: Temp:  [96.8 F (36 C)-98.5 F (36.9 C)] 96.8 F (36 C) (02/22 0503) Pulse Rate:  [59-62] 59 (02/22 0503) Resp:  [18] 18 (02/22 0503) BP: (155-157)/(63-75) 155/70 (02/22 0503) SpO2:  [98 %-99 %] 99 % (02/22 0503) Last BM Date: 10/01/16 3300 IV Urine 876 BM x 2 yesterday Afebrile, VSS WBC is up slightly BMP OK   Intake/Output from previous day: 02/21 0701 - 02/22 0700 In: 3300 [I.V.:3000; IV Piggyback:300] Out: 876 [Urine:876] Intake/Output this shift: No intake/output data recorded.  General appearance: alert, cooperative and no distress Resp: Coarse upper air way breath sounds, he has a hard time clearing this. GI: Continues to be tender in the left lower quadrant.  Lab Results:   Recent Labs  10/01/16 0528 10/02/16 0500  WBC 9.9 10.7*  HGB 10.1* 10.9*  HCT 31.4* 33.2*  PLT 265 250    BMET  Recent Labs  10/01/16 0528 10/02/16 0500  NA 141 139  K 3.9 4.4  CL 118* 117*  CO2 19* 20*  GLUCOSE 126* 121*  BUN 23* 18  CREATININE 1.17 1.11  CALCIUM 7.9* 8.1*   PT/INR No results for input(s): LABPROT, INR in the last 72 hours.   Recent Labs Lab 09/27/16 1823  AST 20  ALT 27  ALKPHOS 70  BILITOT 0.6  PROT 7.9  ALBUMIN 3.5     Lipase     Component Value Date/Time   LIPASE 22 09/27/2016 1823     Studies/Results: No results found.  Medications: . enoxaparin (LOVENOX) injection  40 mg Subcutaneous Q24H  . escitalopram  10 mg Oral Daily  . lip balm  1 application Topical BID  . mouth rinse  15 mL Mouth Rinse BID  . meropenem (MERREM) IV  1 g Intravenous Q8H  . pantoprazole  40 mg Oral Q1200  . trimethoprim  100 mg Oral Daily   . 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  CT scan this AM:  Persistent wall thickening of the sigmoid colon compatible with diverticulitis interval dispersion of pneumoperitoneum. No evidence for bowel obstruction. Increase size of collection associated with perforation containing fluid and air measuring 20 x 34 x 35 mm (AP x ML x CC) likely representing abscess.  Peritoneal carcinomatosis with multiple implants and small volume of ascites. The largest implants are present in the supraumbilical anterior abdomen and right lateral abdomen, stable from prior CT. There additional implants in the right pericolic gutter, inferior to the right lobe of the liver, and there are tiny nodular foci within the left pericolic gutter.  Plan:  Dr. Lucia Gaskins has reviewed and would like for IR to evaluate for a drain.  If we can get him by without any surgical intervention.  Continue antibiotics.  If they cannot safely drain, perhaps aspirate and culture.    LOS: 5 days    JENNINGS,WILLARD 10/02/2016 [phone]  Agree with above. On CT scan has focal abscess in LLQ 3.5 x 3.4 cm.  It appears amendable to either perc drain or aspiration.  Will ask IR to see.  Some loose stools. Some discomfort with urination (the abscess is not near the bladder) Wife and daughter in room and I showed them the CT scan.  Alphonsa Overall, MD, [facility] Surgery Pager: [phone] Office phone:  [phone]

## 2016-10-03 ENCOUNTER — Inpatient Hospital Stay (HOSPITAL_COMMUNITY): Payer: PPO

## 2016-10-03 LAB — CULTURE, BLOOD (ROUTINE X 2)
CULTURE: NO GROWTH
Culture: NO GROWTH

## 2016-10-03 LAB — PROTIME-INR
INR: 1.32
Prothrombin Time: 16.5 seconds — ABNORMAL HIGH (ref 11.4–15.2)

## 2016-10-03 MED ORDER — MIDAZOLAM HCL 2 MG/2ML IJ SOLN
INTRAMUSCULAR | Status: AC
  Filled 2016-10-03: qty 6

## 2016-10-03 MED ORDER — FLUMAZENIL 0.5 MG/5ML IV SOLN
INTRAVENOUS | Status: AC
  Filled 2016-10-03: qty 5

## 2016-10-03 MED ORDER — FENTANYL CITRATE (PF) 100 MCG/2ML IJ SOLN
INTRAMUSCULAR | Status: AC
  Filled 2016-10-03: qty 6

## 2016-10-03 MED ORDER — MIDAZOLAM HCL 2 MG/2ML IJ SOLN
INTRAMUSCULAR | Status: AC | PRN
  Administered 2016-10-03 (×2): 1 mg via INTRAVENOUS

## 2016-10-03 MED ORDER — FENTANYL CITRATE (PF) 100 MCG/2ML IJ SOLN
INTRAMUSCULAR | Status: AC | PRN
  Administered 2016-10-03 (×2): 50 ug via INTRAVENOUS

## 2016-10-03 MED ORDER — NALOXONE HCL 0.4 MG/ML IJ SOLN
INTRAMUSCULAR | Status: AC
  Filled 2016-10-03: qty 1

## 2016-10-03 NOTE — Consult Note (Signed)
Chief Complaint: Patient was seen in consultation today for abdominal pain  Referring Physician(s):  Dr. Alphonsa Overall  Supervising Physician: Markus Daft  Patient Status: [facility] - In-pt  History of Present Illness: Austin [name] is a 61 y.o. 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  Patient was found to have sigmoid diverticulitis with small abscess and perforation.  Patient has been managed conservatively with antibiotics.   Repeat CT Abd/Pelvis yesterday showed: -Increase size of collection associated with perforation containing fluid and air measuring 20 x 34 x 35 mm (AP x ML x CC) likely representing abscess.  IR consulted for aspiration and possible drain placement into fluid collection.  Dr. Anselm Pancoast reviewed imaging; safe window present for aspiration, drain placement based on size of collection.   Past Medical History:  Diagnosis Date  . Cerebrovascular accident ([place]) 1994   hx of brainstem stroke, residual diminished lung capacity  . Chronic kidney disease    Left Renal Mass  . Diabetes mellitus without complication ([place])   . Hx of colonic polyps   . Hypertension   . Hypogonadism   . 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 Neuromuscular disorder ([place])    quadraplegic  . Pituitary adenoma ([place])   . 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 Sleep apnea    stopbang=4  . Urinary incontinence   . Venous stasis    edema    Past Surgical History:  Procedure Laterality Date  . CHOLECYSTECTOMY    . COLONOSCOPY  02/27/2012   Procedure: COLONOSCOPY;  Surgeon: Jerene Bears, MD;  Location: [facility] ENDOSCOPY;  Service: Gastroenterology;  Laterality: N/A;  . gamma knife radiation surgery  05/2010   for  pituitary  . PITUITARY SURGERY  2007, 2011  . ROBOT ASSISTED LAPAROSCOPIC NEPHRECTOMY  04/23/2012   Procedure: ROBOTIC ASSISTED LAPAROSCOPIC NEPHRECTOMY;  Surgeon: Alexis Frock, MD;  Location: [facility] ORS;  Service: Urology;  Laterality: Left;  radical  . Robotic Partial Nephrectomy  04-23-12   left   Left Renal Mass  . stomach peg  02/1993   removed 5-6 years later  . TRACHEOSTOMY TUBE PLACEMENT  02/1993   removed 04/1993  . UMBILICAL HERNIA REPAIR  04/23/2012   Procedure: HERNIA REPAIR UMBILICAL ADULT;  Surgeon: Alexis Frock, MD;  Location: [facility] ORS;  Service: Urology;;  . [name]  . vocal cord surgery     injected with collagen, then fat from stomach to improve speech s/p stroke    Allergies: Penicillins  Medications: Prior to Admission medications   Medication Sig Start Date End Date Taking? Authorizing Provider  albuterol (PROVENTIL HFA;VENTOLIN HFA) 108 (90 BASE) MCG/ACT inhaler Inhale 2 puffs into the lungs every 6 (six) hours as needed for wheezing or shortness of breath. 07/07/15  Yes Debbrah Alar, NP  atorvastatin (LIPITOR) 20 MG tablet Take 20 mg by mouth daily.   Yes Historical Provider, MD  clopidogrel (PLAVIX) 75 MG tablet Take 75 mg by mouth at bedtime.   Yes Historical Provider, MD  escitalopram (LEXAPRO) 10 MG tablet Take 10 mg by mouth daily.   Yes Historical Provider, MD  ketoconazole (NIZORAL) 2 % cream Apply 1 application topically 2 (two) times daily as needed for irritation.  Yes Historical Provider, MD  lisinopril-hydrochlorothiazide (PRINZIDE,ZESTORETIC) 20-12.5 MG tablet Take 1 tablet by mouth daily.   Yes Historical Provider, MD  metFORMIN (GLUCOPHAGE) 500 MG tablet Take 500 mg by mouth at bedtime.    Yes Historical Provider, MD  Multiple Vitamin (MULTIVITAMIN WITH MINERALS) TABS tablet Take 1 tablet by mouth daily.   Yes Historical Provider, MD  testosterone enanthate (DELATESTRYL) 200 MG/ML injection Inject 200 mg into the muscle every 14  (fourteen) days. For IM use only   Yes Historical Provider, MD  traMADol (ULTRAM) 50 MG tablet Take 50 mg by mouth every 6 (six) hours as needed for moderate pain.   Yes Historical Provider, MD  traZODone (DESYREL) 100 MG tablet Take 100 mg by mouth at bedtime as needed for sleep.   Yes Historical Provider, MD  triamcinolone cream (KENALOG) 0.1 % Apply 1 application topically 2 (two) times daily as needed (for rash).   Yes Historical Provider, MD  trimethoprim (TRIMPEX) 100 MG tablet Take 100 mg by mouth daily.   Yes Historical Provider, MD     Family History  Problem Relation Age of Onset  . Alcohol abuse Father   . Throat cancer Father   . Esophageal cancer Father 28  . Arthritis Mother   . Hyperlipidemia Mother   . Uterine cancer Mother 84  . Colon cancer Brother   . Arthritis Maternal Grandmother   . Hypertension Brother   . Stroke Paternal Grandmother     Social History   Social History  . Marital status: Married    Spouse name: N/A  . Number of children: 3  . Years of education: N/A   Occupational History  . disabled Unemployed   Social History Main Topics  . Smoking status: Former Smoker    Packs/day: 0.50    Years: 23.00    Types: Cigarettes    Quit date: 09/24/1992  . Smokeless tobacco: Never Used  . Alcohol use No     Comment: rarley  . Drug use: No  . Sexual activity: Not Asked   Other Topics Concern  . None   Social History Narrative   Retired, disabled [place]  Constitutional: Negative for fatigue and fever.  Respiratory: Negative for cough and shortness of breath.   Cardiovascular: Negative for chest pain.  Gastrointestinal: Positive for abdominal pain and constipation.  Psychiatric/Behavioral: Negative for behavioral problems and confusion.    Vital Signs: BP (!) 143/76 (BP Location: Right Arm)   Pulse 68   Temp 98.3 F (36.8 C) (Oral)   Resp 16   Ht 6' (1.829 m)   Wt 173 lb 11.6 oz (78.8 kg)   SpO2 98%    BMI 23.56 kg/m   Physical Exam  Constitutional: He is oriented to person, place, and time. He appears well-developed.  Able to communicate with limited speech and gestures.   Cardiovascular: Normal rate, regular rhythm and normal heart sounds.   Pulmonary/Chest: Effort normal and breath sounds normal. No respiratory distress.  Neurological: He is alert and oriented to person, place, and time.  quadraplegia  Skin: Skin is warm and dry.  Psychiatric: He has a normal mood and affect. His behavior is normal. Judgment and thought content normal.  Nursing note and vitals reviewed.   Mallampati Score:  MD Evaluation Airway: WNL Heart: WNL Abdomen: WNL Chest/ Lungs: WNL ASA  Classification: 3 Mallampati/Airway Score: Two  Imaging: Ct Abdomen Pelvis Wo Contrast  Result Date: 09/27/2016 CLINICAL DATA:  62 year old  male with left lower quadrant abdominal pain since this morning. Nausea. History of renal cell carcinoma status post left nephrectomy. EXAM: CT ABDOMEN AND PELVIS WITHOUT CONTRAST TECHNIQUE: Multidetector CT imaging of the abdomen and pelvis was performed following the standard protocol without IV contrast. COMPARISON:  Multiple priors, most recently PET-CT 12/25/2015. FINDINGS: Lower chest: 8 mm pulmonary nodule in the left lower lobe (image 35 of series 7), similar to the prior PET-CT. Extensive scarring in the visualize lung bases. Aortic atherosclerosis. Hepatobiliary: No definite cystic or solid hepatic lesions are noted. Status post cholecystectomy. Pancreas: No definite pancreatic mass or peripancreatic inflammatory changes are noted on today's noncontrast CT examination. Spleen: 4.4 cm subtle low-attenuation lesion in the periphery of the spleen is similar to several prior examinations, potentially a metastatic lesion. Adrenals/Urinary Tract: Status post left nephrectomy. Areas of fat necrosis are again noted in the nephrectomy bed. 9 mm high attenuation lesion extending  exophytically from the lower pole of the right kidney is similar to prior studies, incompletely characterized on today's noncontrast CT examination, but likely to represent a small proteinaceous/hemorrhagic cysts. Right adrenal gland is unremarkable in appearance although there is a small adjacent peritoneal implant. Enlarging lesion in the left adrenal gland which currently measures 2.1 x 2.3 cm, concerning for metastatic lesion. No hydroureteronephrosis on the right side. Urinary bladder is unremarkable in appearance. Stomach/Bowel: Unenhanced appearance of the stomach is normal. There is no pathologic dilatation of small bowel or colon. Numerous colonic diverticulae are noted. Notably, in the proximal sigmoid colon there is extensive inflammation around several diverticulae where there is also some adjacent extraluminal gas, concerning for an acute diverticulitis with frank perforation. This is best appreciated on axial images 87 and 88 of series 2. In addition, there is a small amount of extraluminal fluid best appreciated on axial image 73 of series 2 and coronal image 59 of series 4 where this measures 1.5 x 2.3 x 3.6 cm, likely to reflect a developing diverticular abscess. Normal appendix. Vascular/Lymphatic: Aortic atherosclerosis, without evidence of aneurysm in the abdominal or pelvic vasculature. No lymphadenopathy noted in the abdomen or pelvis. Reproductive: Prostate gland and seminal vesicles are unremarkable in appearance. Other: Several small locules of extraluminal gas are noted throughout the peritoneal cavity. Multiple soft tissue masses are noted throughout the peritoneal cavity. The largest of these is located centrally and has increased in size compared to the prior examinations measuring up to 6.0 x 8.7 cm (axial image 52 of series 2) on today's examination versus 4.8 x 5.3 cm on prior PET-CT 12/25/2015. Another large lesion is also noted in the right pericolic gutter measuring up to 5.6 x 4.8  cm (axial image 53 of series 2). Several other smaller soft tissue lesions are also noted elsewhere throughout the peritoneal cavity, compatible with progressive peritoneal disease. No significant volume of ascites. Musculoskeletal: There are no aggressive appearing lytic or blastic lesions noted in the visualized portions of the skeleton. IMPRESSION: 1. Acute perforated diverticulitis of the proximal sigmoid colon. Small volume of pneumoperitoneum noted. Small volume of extra luminal fluid adjacent to the site of perforation likely reflects a developing diverticular abscess. Surgical consultation is strongly recommended. 2. Progression of metastatic disease, including worsening intraperitoneal metastatic lesions and enlarging left adrenal lesion, as detailed above. Splenic lesion appears very similar to prior examinations. 3. Aortic atherosclerosis. 4. Additional incidental findings, as above. Critical Value/emergent results were called by telephone at the time of interpretation on 09/27/2016 at 9:30 pm to Dr. Addison Lank, who  verbally acknowledged these results. Electronically Signed   By: Vinnie Langton M.D.   On: 09/27/2016 21:31   Ct Abdomen Pelvis W Contrast  Result Date: 10/02/2016 CLINICAL DATA:  61 y/o M; diverticulitis of the colon with perforation. Metastatic renal cancer. EXAM: CT ABDOMEN AND PELVIS WITH CONTRAST TECHNIQUE: Multidetector CT imaging of the abdomen and pelvis was performed using the standard protocol following bolus administration of intravenous contrast. CONTRAST:  132m ISOVUE-300 IOPAMIDOL (ISOVUE-300) INJECTION 61% COMPARISON:  09/27/2016 CT abdomen and pelvis. FINDINGS: Lower chest: Stable 8 mm pulmonary nodule in left lower lobe. Stable small right greater than left pleural effusions. Hepatobiliary: No focal liver abnormality is seen. Status post cholecystectomy. No biliary dilatation. Pancreas: Unremarkable. No pancreatic ductal dilatation or surrounding inflammatory changes.  Spleen: Stable 4.4 cm low-attenuation lesion within the anterior spleen. Adrenals/Urinary Tract: Left adrenal nodule measuring 20 x 23 mm is stable. Stable 8 mm right adrenal nodule. Status post left nephrectomy. Fat necrosis in the nephrectomy bed is stable. 9 mm high attenuation indeterminate no hydronephrosis. Normal bladder. Nodule arising from lower pole of right kidney is stable. Stomach/Bowel: Persistent wall thickening of the sigmoid colon compatible with diverticulitis interval dispersion of pneumoperitoneum. No evidence for bowel obstruction. Increase size of collection associated with perforation containing fluid and air measuring 20 x 34 x 35 mm (AP x ML x CC) likely representing abscess. Vascular/Lymphatic: Aortic atherosclerosis. No enlarged abdominal or pelvic lymph nodes. Reproductive: Prostate is unremarkable. Other: Peritoneal carcinomatosis with multiple implants and small volume of ascites. The largest implants are present in the supraumbilical anterior abdomen and right lateral abdomen, stable from prior CT. There additional implants in the right pericolic gutter, inferior to the right lobe of the liver, and there are tiny nodular foci within the left pericolic gutter. Musculoskeletal: No acute osseous abnormality. Mild-to-moderate multilevel degenerative changes of the thoracic and lumbar spine. IMPRESSION: 1. Increase in size of pericolonic abscess associated with perforated sigmoid diverticulitis. Persistent sigmoid colon inflammatory changes. 2. Interval dispersion of pneumoperitoneum. 3. Stable metastatic disease with peritoneal implants, bilateral adrenal nodules, and splenic mass. 4. Stable left lower lobe pulmonary nodule. 5. 9 mm high attenuation indeterminate nodule of right kidney, favored to represent hemorrhagic cyst on prior studies. Electronically Signed   By: LKristine GarbeM.D.   On: 10/02/2016 15:31    Labs:  CBC:  Recent Labs  09/28/16 0333 09/30/16 0344  10/01/16 0528 10/02/16 0500  WBC 24.5* 14.4* 9.9 10.7*  HGB 11.5* 10.4* 10.1* 10.9*  HCT 35.5* 33.0* 31.4* 33.2*  PLT 269 245 265 250    COAGS:  Recent Labs  10/02/16 2353  INR 1.32    BMP:  Recent Labs  09/28/16 0333 09/30/16 0344 10/01/16 0528 10/02/16 0500  NA 139 143 141 139  K 5.0 4.0 3.9 4.4  CL 114* 119* 118* 117*  CO2 19* 17* 19* 20*  GLUCOSE 107* 69 126* 121*  BUN 29* 27* 23* 18  CALCIUM 8.3* 8.1* 7.9* 8.1*  CREATININE 1.64* 1.33* 1.17 1.11  GFRNONAA 43* 56* >60 >60  GFRAA 50* >60 >60 >60    LIVER FUNCTION TESTS:  Recent Labs  12/25/15 0935 03/27/16 0933 07/11/16 0907 09/27/16 1823  BILITOT <0.30 <0.30 0.24 0.6  AST '12 19 16 20  ' ALT 13 32 25 27  ALKPHOS 76 92 103 70  PROT 8.0 8.0 7.7 7.9  ALBUMIN 3.3* 3.1* 3.0* 3.5    TUMOR MARKERS: No results for input(s): AFPTM, CEA, CA199, CHROMGRNA in the last 8760 hours.  Assessment and Plan: 1.  Abdominal fluid collection Patient with abdominal fluid collection which has increased in size since admission despite antibiotic therapy.  IR consulted for aspiration and possible drainage of collection.  Case reviewed by Dr. Anselm Pancoast who feels patient is appropriate for procedure.  Patient has been NPO.  Lovenox was held overnight.  His INR is 1.32. Anticipate procedure today.  Risks and Benefits discussed with the patient and patient's wife including bleeding, infection, damage to adjacent structures, bowel perforation/fistula connection, and sepsis. All of the patient's and patient's wife's questions were answered, patient and wife are agreeable to proceed. Consent signed and in chart.   Thank you for this interesting consult.  I greatly enjoyed meeting Austin [name] and look forward to participating in their care.  A copy of this report was sent to the requesting provider on this date.  Electronically Signed: Docia Barrier 10/03/2016, 8:27 AM   I spent a total of 40 Minutes    in face to  face in clinical consultation, greater than 50% of which was counseling/coordinating care for abdominal fluid collection, likely abscess

## 2016-10-03 NOTE — Procedures (Signed)
Interventional Radiology Procedure Note  Procedure: CT guided aspiration of abscess.  Sample sent to lab.    Complications: None  Recommendations:  - Follow up culture - Do not submerge for 7 days - Routine wound care   Signed,  Dulcy Fanny. Earleen Newport, DO

## 2016-10-03 NOTE — Progress Notes (Signed)
PT Cancellation Note  Patient Details Name: GILL DELROSSI MRN: [id number] DOB: 07-12-1956   Cancelled Treatment:     CT aspiration procedure      Rica Koyanagi  PTA [facility]  Acute  Rehab Pager      [phone]

## 2016-10-03 NOTE — Progress Notes (Signed)
Subjective: Not complaining of pain in the left lower quadrant this a.m. is much as he was yesterday. Seems very comfortable.  Objective: Vital signs in last 24 hours: Temp:  [98 F (36.7 C)-98.6 F (37 C)] 98.3 F (36.8 C) (02/23 0524) Pulse Rate:  [57-68] 68 (02/23 0524) Resp:  [16-18] 16 (02/23 0524) BP: (131-157)/(69-80) 143/76 (02/23 0524) SpO2:  [97 %-99 %] 98 % (02/23 0524) Last BM Date: 10/02/16 Nothing by mouth 1000 IV 2400 urine BM 3 aFebrile vital signs are stable. No labs this a.m. CT scan yesterday shows increased size of collection associated perforation containing fluid and air measuring 20 x 34 x 35 mm likely representing abscess. He also has peritoneal carcinomatosis with multiple implants small volume ascites. The largest implants are in the supraumbilical anterior abdomen the right lateral abdomen this is stable. There additional implants in the right pericolic gutter inferior to the right lobe of the liver and tiny nodular foci in the left  Pericolic Gutter. Intake/Output from previous day: 02/22 0701 - 02/23 0700 In: 1000 [I.V.:1000] Out: 2400 [Urine:2400] Intake/Output this shift: No intake/output data recorded.  General appearance: alert, cooperative and no distress Resp: clear to auscultation bilaterally GI: Soft, few bowel sounds, positive BM. Less pain left lower quadrant this a.m. on exam.  Lab Results:   Recent Labs  10/01/16 0528 10/02/16 0500  WBC 9.9 10.7*  HGB 10.1* 10.9*  HCT 31.4* 33.2*  PLT 265 250    BMET  Recent Labs  10/01/16 0528 10/02/16 0500  NA 141 139  K 3.9 4.4  CL 118* 117*  CO2 19* 20*  GLUCOSE 126* 121*  BUN 23* 18  CREATININE 1.17 1.11  CALCIUM 7.9* 8.1*   PT/INR  Recent Labs  10/02/16 2353  LABPROT 16.5*  INR 1.32     Recent Labs Lab 09/27/16 1823  AST 20  ALT 27  ALKPHOS 70  BILITOT 0.6  PROT 7.9  ALBUMIN 3.5     Lipase     Component Value Date/Time   LIPASE 22 09/27/2016 1823      Studies/Results: Ct Abdomen Pelvis W Contrast  Result Date: 10/02/2016 CLINICAL DATA:  61 y/o M; diverticulitis of the colon with perforation. Metastatic renal cancer. EXAM: CT ABDOMEN AND PELVIS WITH CONTRAST TECHNIQUE: Multidetector CT imaging of the abdomen and pelvis was performed using the standard protocol following bolus administration of intravenous contrast. CONTRAST:  149m ISOVUE-300 IOPAMIDOL (ISOVUE-300) INJECTION 61% COMPARISON:  09/27/2016 CT abdomen and pelvis. FINDINGS: Lower chest: Stable 8 mm pulmonary nodule in left lower lobe. Stable small right greater than left pleural effusions. Hepatobiliary: No focal liver abnormality is seen. Status post cholecystectomy. No biliary dilatation. Pancreas: Unremarkable. No pancreatic ductal dilatation or surrounding inflammatory changes. Spleen: Stable 4.4 cm low-attenuation lesion within the anterior spleen. Adrenals/Urinary Tract: Left adrenal nodule measuring 20 x 23 mm is stable. Stable 8 mm right adrenal nodule. Status post left nephrectomy. Fat necrosis in the nephrectomy bed is stable. 9 mm high attenuation indeterminate no hydronephrosis. Normal bladder. Nodule arising from lower pole of right kidney is stable. Stomach/Bowel: Persistent wall thickening of the sigmoid colon compatible with diverticulitis interval dispersion of pneumoperitoneum. No evidence for bowel obstruction. Increase size of collection associated with perforation containing fluid and air measuring 20 x 34 x 35 mm (AP x ML x CC) likely representing abscess. Vascular/Lymphatic: Aortic atherosclerosis. No enlarged abdominal or pelvic lymph nodes. Reproductive: Prostate is unremarkable. Other: Peritoneal carcinomatosis with multiple implants and small volume of ascites. The  largest implants are present in the supraumbilical anterior abdomen and right lateral abdomen, stable from prior CT. There additional implants in the right pericolic gutter, inferior to the right lobe of  the liver, and there are tiny nodular foci within the left pericolic gutter. Musculoskeletal: No acute osseous abnormality. Mild-to-moderate multilevel degenerative changes of the thoracic and lumbar spine. IMPRESSION: 1. Increase in size of pericolonic abscess associated with perforated sigmoid diverticulitis. Persistent sigmoid colon inflammatory changes. 2. Interval dispersion of pneumoperitoneum. 3. Stable metastatic disease with peritoneal implants, bilateral adrenal nodules, and splenic mass. 4. Stable left lower lobe pulmonary nodule. 5. 9 mm high attenuation indeterminate nodule of right kidney, favored to represent hemorrhagic cyst on prior studies. Electronically Signed   By: Kristine Garbe M.D.   On: 10/02/2016 15:31    Medications: . [START ON 10/04/2016] enoxaparin (LOVENOX) injection  40 mg Subcutaneous Q24H  . escitalopram  10 mg Oral Daily  . lip balm  1 application Topical BID  . mouth rinse  15 mL Mouth Rinse BID  . meropenem (MERREM) IV  1 g Intravenous Q8H  . pantoprazole  40 mg Oral Q1200  . 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 This is day 6 without by mouth intake will check a prealbumin with labs tomorrow. If we do not start feeding him soon may consider TNA.  LOS: 6 days    JENNINGS,WILLARD 10/03/2016 [phone]  Agree with above. In IR right now.  He  ended up having an aspiration of the air/fluid in the LLQ.  Wife was not in room.  He said he was doing well.  Will start sips from the floor with plans to start clear liquid tray in AM.  Alphonsa Overall, MD, [facility] Surgery Pager: [phone] Office phone:  [phone]

## 2016-10-04 LAB — CBC WITH DIFFERENTIAL/PLATELET
Basophils Absolute: 0.1 10*3/uL (ref 0.0–0.1)
Basophils Relative: 1 %
Eosinophils Absolute: 0.5 10*3/uL (ref 0.0–0.7)
Eosinophils Relative: 4 %
HEMATOCRIT: 33.6 % — AB (ref 39.0–52.0)
Hemoglobin: 10.8 g/dL — ABNORMAL LOW (ref 13.0–17.0)
LYMPHS ABS: 0.9 10*3/uL (ref 0.7–4.0)
LYMPHS PCT: 9 %
MCH: 27.3 pg (ref 26.0–34.0)
MCHC: 32.1 g/dL (ref 30.0–36.0)
MCV: 84.8 fL (ref 78.0–100.0)
MONO ABS: 1.1 10*3/uL — AB (ref 0.1–1.0)
MONOS PCT: 10 %
NEUTROS ABS: 8.1 10*3/uL — AB (ref 1.7–7.7)
Neutrophils Relative %: 76 %
Platelets: 255 10*3/uL (ref 150–400)
RBC: 3.96 MIL/uL — ABNORMAL LOW (ref 4.22–5.81)
RDW: 16.1 % — AB (ref 11.5–15.5)
WBC: 10.5 10*3/uL (ref 4.0–10.5)

## 2016-10-04 LAB — BASIC METABOLIC PANEL
Anion gap: 4 — ABNORMAL LOW (ref 5–15)
BUN: 10 mg/dL (ref 6–20)
CALCIUM: 8 mg/dL — AB (ref 8.9–10.3)
CO2: 21 mmol/L — AB (ref 22–32)
Chloride: 114 mmol/L — ABNORMAL HIGH (ref 101–111)
Creatinine, Ser: 1.07 mg/dL (ref 0.61–1.24)
GFR calc Af Amer: >60 mL/min (ref >60)
GFR calc non Af Amer: >60 mL/min (ref >60)
GLUCOSE: 102 mg/dL — AB (ref 65–99)
Potassium: 4.2 mmol/L (ref 3.5–5.1)
Sodium: 139 mmol/L (ref 135–145)

## 2016-10-04 NOTE — Progress Notes (Signed)
Subjective: Pain is better.  S/p aspiration of LLQ fluid collection yesterday.    Objective: Vital signs in last 24 hours: Temp:  [98.1 F (36.7 C)-98.6 F (37 C)] 98.2 F (36.8 C) (02/24 0600) Pulse Rate:  [60-68] 68 (02/24 0600) Resp:  [10-16] 16 (02/24 0600) BP: (134-157)/(71-92) 137/80 (02/24 0600) SpO2:  [96 %-100 %] 98 % (02/24 0600) Last BM Date: 10/03/16   Intake/Output from previous day: 02/23 0701 - 02/24 0700 In: 1815.8 [I.V.:1615.8; IV Piggyback:200] Out: 2900 [Urine:2900] Intake/Output this shift: No intake/output data recorded.  General appearance: alert, cooperative and no distress Resp: Coarse upper air way breath sounds, he has a hard time clearing this. GI: Mildly tender in the left lower quadrant.  Lab Results:   Recent Labs  10/02/16 0500 10/04/16 0453  WBC 10.7* 10.5  HGB 10.9* 10.8*  HCT 33.2* 33.6*  PLT 250 255    BMET  Recent Labs  10/02/16 0500 10/04/16 0453  NA 139 139  K 4.4 4.2  CL 117* 114*  CO2 20* 21*  GLUCOSE 121* 102*  BUN 18 10  CREATININE 1.11 1.07  CALCIUM 8.1* 8.0*   PT/INR  Recent Labs  10/02/16 2353  LABPROT 16.5*  INR 1.32     Recent Labs Lab 09/27/16 1823  AST 20  ALT 27  ALKPHOS 70  BILITOT 0.6  PROT 7.9  ALBUMIN 3.5     Lipase     Component Value Date/Time   LIPASE 22 09/27/2016 1823     Studies/Results: Ct Abdomen Pelvis W Contrast  Result Date: 10/02/2016 CLINICAL DATA:  61 y/o M; diverticulitis of the colon with perforation. Metastatic renal cancer. EXAM: CT ABDOMEN AND PELVIS WITH CONTRAST TECHNIQUE: Multidetector CT imaging of the abdomen and pelvis was performed using the standard protocol following bolus administration of intravenous contrast. CONTRAST:  170m ISOVUE-300 IOPAMIDOL (ISOVUE-300) INJECTION 61% COMPARISON:  09/27/2016 CT abdomen and pelvis. FINDINGS: Lower chest: Stable 8 mm pulmonary nodule in left lower lobe. Stable small right greater than left pleural effusions.  Hepatobiliary: No focal liver abnormality is seen. Status post cholecystectomy. No biliary dilatation. Pancreas: Unremarkable. No pancreatic ductal dilatation or surrounding inflammatory changes. Spleen: Stable 4.4 cm low-attenuation lesion within the anterior spleen. Adrenals/Urinary Tract: Left adrenal nodule measuring 20 x 23 mm is stable. Stable 8 mm right adrenal nodule. Status post left nephrectomy. Fat necrosis in the nephrectomy bed is stable. 9 mm high attenuation indeterminate no hydronephrosis. Normal bladder. Nodule arising from lower pole of right kidney is stable. Stomach/Bowel: Persistent wall thickening of the sigmoid colon compatible with diverticulitis interval dispersion of pneumoperitoneum. No evidence for bowel obstruction. Increase size of collection associated with perforation containing fluid and air measuring 20 x 34 x 35 mm (AP x ML x CC) likely representing abscess. Vascular/Lymphatic: Aortic atherosclerosis. No enlarged abdominal or pelvic lymph nodes. Reproductive: Prostate is unremarkable. Other: Peritoneal carcinomatosis with multiple implants and small volume of ascites. The largest implants are present in the supraumbilical anterior abdomen and right lateral abdomen, stable from prior CT. There additional implants in the right pericolic gutter, inferior to the right lobe of the liver, and there are tiny nodular foci within the left pericolic gutter. Musculoskeletal: No acute osseous abnormality. Mild-to-moderate multilevel degenerative changes of the thoracic and lumbar spine. IMPRESSION: 1. Increase in size of pericolonic abscess associated with perforated sigmoid diverticulitis. Persistent sigmoid colon inflammatory changes. 2. Interval dispersion of pneumoperitoneum. 3. Stable metastatic disease with peritoneal implants, bilateral adrenal nodules, and splenic mass. 4. Stable  left lower lobe pulmonary nodule. 5. 9 mm high attenuation indeterminate nodule of right kidney, favored to  represent hemorrhagic cyst on prior studies. Electronically Signed   By: Kristine Garbe M.D.   On: 10/02/2016 15:31    Medications: . enoxaparin (LOVENOX) injection  40 mg Subcutaneous Q24H  . escitalopram  10 mg Oral Daily  . lip balm  1 application Topical BID  . mouth rinse  15 mL Mouth Rinse BID  . meropenem (MERREM) IV  1 g Intravenous Q8H  . pantoprazole  40 mg Oral Q1200  . trimethoprim  100 mg Oral Daily   . 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 8/54/6270

## 2016-10-05 ENCOUNTER — Inpatient Hospital Stay (HOSPITAL_COMMUNITY): Payer: PPO | Admitting: Certified Registered"

## 2016-10-05 NOTE — Progress Notes (Signed)
  Subjective: Pain is better.  S/p aspiration of LLQ fluid collection fri.  Having good bowel function  Objective: Vital signs in last 24 hours: Temp:  [98.2 F (36.8 C)-98.8 F (37.1 C)] 98.2 F (36.8 C) (02/25 0519) Pulse Rate:  [73-75] 73 (02/25 0519) Resp:  [16] 16 (02/25 0519) BP: (142-153)/(73-83) 142/73 (02/25 0519) SpO2:  [96 %-98 %] 96 % (02/25 0519) Last BM Date: 10/03/16   Intake/Output from previous day: 02/24 0701 - 02/25 0700 In: -  Out: 2850 [Urine:2850] Intake/Output this shift: No intake/output data recorded.  General appearance: alert, cooperative and no distress Resp: Coarse upper air way breath sounds, he has a hard time clearing this. GI: Mildly tender in the left lower quadrant.  Lab Results:   Recent Labs  10/04/16 0453  WBC 10.5  HGB 10.8*  HCT 33.6*  PLT 255    BMET  Recent Labs  10/04/16 0453  NA 139  K 4.2  CL 114*  CO2 21*  GLUCOSE 102*  BUN 10  CREATININE 1.07  CALCIUM 8.0*   PT/INR  Recent Labs  10/02/16 2353  LABPROT 16.5*  INR 1.32    No results for input(s): AST, ALT, ALKPHOS, BILITOT, PROT, ALBUMIN in the last 168 hours.   Lipase     Component Value Date/Time   LIPASE 22 09/27/2016 1823     Studies/Results: No results found.  Medications: . enoxaparin (LOVENOX) injection  40 mg Subcutaneous Q24H  . escitalopram  10 mg Oral Daily  . lip balm  1 application Topical BID  . mouth rinse  15 mL Mouth Rinse BID  . meropenem (MERREM) IV  1 g Intravenous Q8H  . pantoprazole  40 mg Oral Q1200  . trimethoprim  100 mg Oral Daily   . 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 01/21/2448

## 2016-10-05 NOTE — Progress Notes (Signed)
Dear Doctor:  This patient has been identified as a candidate for PICC for the following reason (s): poor veins/poor circulatory system (CHF, COPD, emphysema, diabetes, steroid use, IV drug abuse, etc.) If you agree, please write an order for the indicated device. For any questions contact the Vascular Access Team at [phone] if no answer, please leave a message.  Thank you for supporting the early vascular access assessment program. 

## 2016-10-05 NOTE — Progress Notes (Signed)
Pharmacy Antibiotic Note  Austin [name] is a 61 y.o. male admitted on 09/27/2016 with Intra-abdominal infection .  Pharmacy has been consulted for meropenem dosing.   Plan:  Continue meropenem to 1g IV q8h for CrCl > 23ms/min  Continue to follow renal function, cultures and clinical course  Height: 6' (182.9 cm) Weight: 173 lb 11.6 oz (78.8 kg) IBW/kg (Calculated) : 77.6  Temp (24hrs), Avg:98.5 F (36.9 C), Min:98.2 F (36.8 C), Max:98.8 F (37.1 C)   Recent Labs Lab 09/30/16 0344 10/01/16 0528 10/02/16 0500 10/04/16 0453  WBC 14.4* 9.9 10.7* 10.5  CREATININE 1.33* 1.17 1.11 1.07    Estimated Creatinine Clearance: 78.6 mL/min (by C-G formula based on SCr of 1.07 mg/dL).    Allergies  Allergen Reactions  . Penicillins Rash and Other (See Comments)    Has patient had a PCN reaction causing immediate rash, facial/tongue/throat swelling, SOB or lightheadedness with hypotension: No Has patient had a PCN reaction causing severe rash involving mucus membranes or skin necrosis: No Has patient had a PCN reaction that required hospitalization No Has patient had a PCN reaction occurring within the last 10 years: No If all of the above answers are "NO", then may proceed with Cephalosporin use.    Antimicrobials this admission:  2/17 Cipro/Flagyl x 1 2/18 Meropenem >>  Dose adjustments this admission:  2/19 increase meropenem to 1g q8h for improved SCr  Microbiology results:  2/6 Urine Cx: Serratia, res to Amox/clav, Cefaz   2/17 BCx: NGF 2/18 MRSA PCR: neg 2/23 surgical wound culture: NGTD  Thank you for allowing pharmacy to be a part of this patient's care.  NRoyetta Asal PharmD, BCPS Pager [phone]/25/2018 10:56 AM

## 2016-10-06 LAB — CBC
HEMATOCRIT: 31.4 % — AB (ref 39.0–52.0)
Hemoglobin: 10.2 g/dL — ABNORMAL LOW (ref 13.0–17.0)
MCH: 27.5 pg (ref 26.0–34.0)
MCHC: 32.5 g/dL (ref 30.0–36.0)
MCV: 84.6 fL (ref 78.0–100.0)
PLATELETS: 264 10*3/uL (ref 150–400)
RBC: 3.71 MIL/uL — AB (ref 4.22–5.81)
RDW: 16.1 % — ABNORMAL HIGH (ref 11.5–15.5)
WBC: 9.2 10*3/uL (ref 4.0–10.5)

## 2016-10-06 LAB — C DIFFICILE QUICK SCREEN W PCR REFLEX
C DIFFICILE (CDIFF) TOXIN: NEGATIVE
C Diff antigen: POSITIVE — AB

## 2016-10-06 LAB — CLOSTRIDIUM DIFFICILE BY PCR: Toxigenic C. Difficile by PCR: POSITIVE — AB

## 2016-10-06 MED ORDER — SACCHAROMYCES BOULARDII 250 MG PO CAPS
250.0000 mg | ORAL_CAPSULE | Freq: Two times a day (BID) | ORAL | Status: DC
  Administered 2016-10-06 – 2016-10-09 (×7): 250 mg via ORAL
  Filled 2016-10-06 (×7): qty 1

## 2016-10-06 MED ORDER — VANCOMYCIN 50 MG/ML ORAL SOLUTION
125.0000 mg | Freq: Four times a day (QID) | ORAL | Status: DC
  Administered 2016-10-06 – 2016-10-09 (×11): 125 mg via ORAL
  Filled 2016-10-06 (×12): qty 2.5

## 2016-10-06 MED ORDER — PREMIER PROTEIN SHAKE
11.0000 [oz_av] | Freq: Two times a day (BID) | ORAL | Status: DC
  Administered 2016-10-06 – 2016-10-09 (×3): 11 [oz_av] via ORAL

## 2016-10-06 NOTE — Progress Notes (Signed)
PT Cancellation Note  Patient Details Name: Austin [name] MRN: [id number] DOB: 1955/11/21   Cancelled Treatment:     spouse stated pt is still having loose stools so staying in bed.  Pt did get OOB to his wc with family "the other day" and did great with his transfer.  Will continue to follow.     Rica Koyanagi  PTA [facility]  Acute  Rehab Pager      [phone]

## 2016-10-06 NOTE — Progress Notes (Signed)
[facility]  Austin [name] is a new pt with [facility] this hospital admission  [facility] will provide [facility] services for Austin [name].  [facility] hospital team will follow pt until DC home to support home care needs.  If patient discharges after hours, please call [phone].   Larry Sierras 10/06/2016, 5:53 PM

## 2016-10-06 NOTE — Progress Notes (Signed)
Nutrition Follow-up  INTERVENTION:   Provide Premier Protein BID, each supplement provides 160kcal and 30g protein.  RD to continue to monitor for needs  NUTRITION DIAGNOSIS:   Inadequate oral intake related to inability to eat as evidenced by NPO status.  Now on soft diet.  GOAL:   Patient will meet greater than or equal to 90% of their needs  Progressing.  MONITOR:   PO intake, Supplement acceptance, Labs, Weight trends, I & O's  ASSESSMENT:   61 y/o male history of renal cell carcinoma with progressive metastasis since September 2013. He was treated with multiagent briefly and therapy discontinued in 2015 for tolerance. Admitted on 09/27/2016 with Intra-abdominal infection secondary to Sigmoid diverticulitis with perforation. Pt is paraplegic from CVA 25 years ago  Patient now on soft diet. Consuming 0-100% of meals. Premier Protein was ordered per pt request.   Labs reviewed. Medications: Protonix tablet daily, Florastor capsule BID, D5 and .45% NaCl w/ KCl infusion at 50 ml/hr  Diet Order:  DIET SOFT Room service appropriate? Yes; Fluid consistency: Thin  Skin:  Reviewed, no issues  Last BM:  2/26  Height:   Ht Readings from Last 1 Encounters:  09/28/16 6' (1.829 m)    Weight:   Wt Readings from Last 1 Encounters:  09/28/16 173 lb 11.6 oz (78.8 kg)    Ideal Body Weight:  76.9 kg (adjusted for paraplegia )  BMI:  Body mass index is 23.56 kg/m.  Estimated Nutritional Needs:   Kcal:  2000-2300kcal/day   Protein:  87-102g/day   Fluid:  >2L/day   EDUCATION NEEDS:   No education needs identified at this time  Clayton Bibles, MS, RD, LDN Pager: [phone] After Hours Pager: [phone]

## 2016-10-06 NOTE — Progress Notes (Signed)
  Subjective: He looks good this a.m. He denies pain is left lower quadrant this a.m. His wife is present and says he is not eating and reports he is having stools and loose diarrhea with voiding and each bowel movement.  Wife is wondering what the plan with antibiotics will be now, and projected course of his hospitalization. If he needs ongoing IV fluids we may need to add PICC. Objective: Vital signs in last 24 hours: Temp:  [98.7 F (37.1 C)-99.1 F (37.3 C)] 98.7 F (37.1 C) (02/26 0557) Pulse Rate:  [76-82] 82 (02/26 0557) Resp:  [16-17] 17 (02/26 0557) BP: (129-157)/(61-86) 156/61 (02/26 0557) SpO2:  [93 %-98 %] 93 % (02/26 0557) Last BM Date: 10/05/16 100 PO recorded 1700 IV Urine 1525 BM x 1  Afebrile, VSS WBC 9.2 - H/H stable CT scan 10/02/16 Intake/Output from previous day: 02/25 0701 - 02/26 0700 In: 1747.5 [P.O.:100; I.V.:1547.5; IV Piggyback:100] Out: 1525 [Urine:1525] Intake/Output this shift: No intake/output data recorded.  General appearance: alert, cooperative and no distress Resp: clear to auscultation bilaterally and course upper airway BS GI: soft, non-tender; bowel sounds normal; no masses,  no organomegaly Some swelling in his hands and arm,  Issues with IV sites,  Lab Results:   Recent Labs  10/04/16 0453 10/06/16 0414  WBC 10.5 9.2  HGB 10.8* 10.2*  HCT 33.6* 31.4*  PLT 255 264    BMET  Recent Labs  10/04/16 0453  NA 139  K 4.2  CL 114*  CO2 21*  GLUCOSE 102*  BUN 10  CREATININE 1.07  CALCIUM 8.0*   PT/INR No results for input(s): LABPROT, INR in the last 72 hours.  No results for input(s): AST, ALT, ALKPHOS, BILITOT, PROT, ALBUMIN in the last 168 hours.   Lipase     Component Value Date/Time   LIPASE 22 09/27/2016 1823     Studies/Results: No results found.  Medications: . enoxaparin (LOVENOX) injection  40 mg Subcutaneous Q24H  . escitalopram  10 mg Oral Daily  . lip balm  1 application Topical BID  . mouth  rinse  15 mL Mouth Rinse BID  . meropenem (MERREM) IV  1 g Intravenous Q8H  . pantoprazole  40 mg Oral Q1200  . trimethoprim  100 mg Oral Daily   . 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  Culture from aspiration on Friday still pending.      LOS: 9 days    Alec Jaros 10/06/2016 [phone]

## 2016-10-06 NOTE — Progress Notes (Signed)
C diff is positive and I have started oral Vancomycin.  Started on probiotic this AM also.

## 2016-10-06 NOTE — Care Management Important Message (Signed)
Important Message  Patient Details IM Letter given to Susanne/Case Manager to present to Patient Name: Austin [name] MRN: [id number] Date of Birth: Feb 19, 1956   Medicare Important Message Given:  Yes    Kerin Salen 10/06/2016, 12:29 [place] Message  Patient Details  Name: Austin [name] MRN: [id number] Date of Birth: 18-Sep-1955   Medicare Important Message Given:  Yes    Kerin Salen 10/06/2016, 12:29 PM

## 2016-10-07 LAB — URINALYSIS, ROUTINE W REFLEX MICROSCOPIC
BILIRUBIN URINE: NEGATIVE
Glucose, UA: 50 mg/dL — AB
KETONES UR: NEGATIVE mg/dL
LEUKOCYTES UA: NEGATIVE
Nitrite: NEGATIVE
Protein, ur: NEGATIVE mg/dL
SQUAMOUS EPITHELIAL / LPF: NONE SEEN
Specific Gravity, Urine: 1.01 (ref 1.005–1.030)
pH: 5 (ref 5.0–8.0)

## 2016-10-07 NOTE — Progress Notes (Signed)
  Subjective: No pain this a.m. Patient notes that diarrhea is somewhat better and he is taking by mouth's better.  Objective: Vital signs in last 24 hours: Temp:  [98.7 F (37.1 C)-99.6 F (37.6 C)] 98.7 F (37.1 C) (02/27 0657) Pulse Rate:  [66-73] 71 (02/27 0657) Resp:  [16] 16 (02/27 0657) BP: (144-155)/(60-71) 154/68 (02/27 0657) SpO2:  [95 %-96 %] 96 % (02/27 0657) Last BM Date: 10/06/16 PO 515 1500 IV 1870 urine BM x 5 Afebrile, VSS No labs this AM No films Intake/Output from previous day: 02/26 0701 - 02/27 0700 In: 2015 [P.O.:515; I.V.:1200; IV Piggyback:300] Out: 1870 [Urine:1870] Intake/Output this shift: No intake/output data recorded.  General appearance: alert, cooperative and no distress Resp: clear to auscultation bilaterally GI: soft, non-tender; bowel sounds normal; no masses,  no organomegaly  Lab Results:   Recent Labs  10/06/16 0414  WBC 9.2  HGB 10.2*  HCT 31.4*  PLT 264    BMET No results for input(s): NA, K, CL, CO2, GLUCOSE, BUN, CREATININE, CALCIUM in the last 72 hours. PT/INR No results for input(s): LABPROT, INR in the last 72 hours.  No results for input(s): AST, ALT, ALKPHOS, BILITOT, PROT, ALBUMIN in the last 168 hours.   Lipase     Component Value Date/Time   LIPASE 22 09/27/2016 1823     Studies/Results: No results found.  Medications: . enoxaparin (LOVENOX) injection  40 mg Subcutaneous Q24H  . escitalopram  10 mg Oral Daily  . lip balm  1 application Topical BID  . mouth rinse  15 mL Mouth Rinse BID  . meropenem (MERREM) IV  1 g Intravenous Q8H  . pantoprazole  40 mg Oral Q1200  . protein supplement shake  11 oz Oral BID BM  . saccharomyces boulardii  250 mg Oral BID  . trimethoprim  100 mg Oral Daily  . vancomycin  125 mg Oral QID    Assessment/Plan Sigmoid diverticulitis with perforation 09/27/16 On Merrem - IR aspiration 10/03/16 C diff colitis - Oral vancomycin started 09/26/16  Paraplegic  from CVA 25 years ago - chronic Plavix Rx - on hold Recurrent Metastatic renal cell cancer/s/p left radical nephrectomy 2013/peritoneal metastasis 2015 UTI - Serratia  Renal Insuffiencey - improved  AODM Pituitary macroadenoma Sleep apnea Hypertension Hypogonadism FEN: IV fluids/soft diet ID: Meropenem 09/27/16 =>>day 11 Trimethoprim 2/18 =>> day 10  Vancomycin PO - 10/06/16 =>> day 2/14 DVT: SCD/Lovenox   Plan: Recheck UA and labs in the a.m. I will ask [place] switching over to by mouth antibiotic for his diverticulitis and discontinuing trimethoprim. He was started on PO vancomycin yesterday afternoon for his C. difficile colitis. Hopefully PT and the staff can get him up in the chair more today. Plan repeat CT tomorrow.   LOS: 10 days    Gabriellia Rempel 10/07/2016 [phone]

## 2016-10-08 ENCOUNTER — Other Ambulatory Visit: Payer: PPO

## 2016-10-08 ENCOUNTER — Inpatient Hospital Stay (HOSPITAL_COMMUNITY): Payer: PPO

## 2016-10-08 LAB — COMPREHENSIVE METABOLIC PANEL
ALBUMIN: 2.3 g/dL — AB (ref 3.5–5.0)
ALK PHOS: 50 U/L (ref 38–126)
ALT: 12 U/L — AB (ref 17–63)
ANION GAP: 5 (ref 5–15)
AST: 13 U/L — ABNORMAL LOW (ref 15–41)
BUN: 13 mg/dL (ref 6–20)
CALCIUM: 8.2 mg/dL — AB (ref 8.9–10.3)
CHLORIDE: 110 mmol/L (ref 101–111)
CO2: 25 mmol/L (ref 22–32)
CREATININE: 1.2 mg/dL (ref 0.61–1.24)
GFR calc non Af Amer: >60 mL/min (ref >60)
GLUCOSE: 98 mg/dL (ref 65–99)
Potassium: 4 mmol/L (ref 3.5–5.1)
SODIUM: 140 mmol/L (ref 135–145)
Total Bilirubin: 0.5 mg/dL (ref 0.3–1.2)
Total Protein: 5.7 g/dL — ABNORMAL LOW (ref 6.5–8.1)

## 2016-10-08 LAB — CBC
HCT: 31.4 % — ABNORMAL LOW (ref 39.0–52.0)
HEMOGLOBIN: 10.2 g/dL — AB (ref 13.0–17.0)
MCH: 27.5 pg (ref 26.0–34.0)
MCHC: 32.5 g/dL (ref 30.0–36.0)
MCV: 84.6 fL (ref 78.0–100.0)
PLATELETS: 248 10*3/uL (ref 150–400)
RBC: 3.71 MIL/uL — AB (ref 4.22–5.81)
RDW: 15.8 % — ABNORMAL HIGH (ref 11.5–15.5)
WBC: 6.6 10*3/uL (ref 4.0–10.5)

## 2016-10-08 MED ORDER — IOPAMIDOL (ISOVUE-300) INJECTION 61%
INTRAVENOUS | Status: AC
  Administered 2016-10-08: 100 mL
  Filled 2016-10-08: qty 100

## 2016-10-08 NOTE — Progress Notes (Signed)
  Subjective: She denies any pain. Say diarrhea is better. He says they're getting him up in the chair more. No complaints of pain on examination of the abdomen.  Objective: Vital signs in last 24 hours: Temp:  [97.5 F (36.4 C)-99 F (37.2 C)] 98.5 F (36.9 C) (02/28 0641) Pulse Rate:  [62-73] 62 (02/28 0641) Resp:  [16] 16 (02/28 0641) BP: (138-159)/(64-73) 159/73 (02/28 0641) SpO2:  [94 %-99 %] 96 % (02/28 0641) Last BM Date: 10/06/16 480 PO 1500 IV 2275 urine BM x 3 Afebrile, VSS Labs OK Last CT 10/02/16 for repeat CT this AM Intake/Output from previous day: 02/27 0701 - 02/28 0700 In: 1980 [P.O.:480; I.V.:1200; IV Piggyback:300] Out: 2275 [Urine:2275] Intake/Output this shift: No intake/output data recorded.  General appearance: alert, cooperative and no distress Resp: clear to auscultation bilaterally and Few rhonchi/rales in base. His upper airway is clear for the last 2 mornings. GI: soft, non-tender; bowel sounds normal; no masses,  no organomegaly  Lab Results:   Recent Labs  10/06/16 0414 10/08/16 0442  WBC 9.2 6.6  HGB 10.2* 10.2*  HCT 31.4* 31.4*  PLT 264 248    BMET  Recent Labs  10/08/16 0442  NA 140  K 4.0  CL 110  CO2 25  GLUCOSE 98  BUN 13  CREATININE 1.20  CALCIUM 8.2*   PT/INR No results for input(s): LABPROT, INR in the last 72 hours.   Recent Labs Lab 10/08/16 0442  AST 13*  ALT 12*  ALKPHOS 50  BILITOT 0.5  PROT 5.7*  ALBUMIN 2.3*     Lipase     Component Value Date/Time   LIPASE 22 09/27/2016 1823     Studies/Results: No results found.  Medications: . enoxaparin (LOVENOX) injection  40 mg Subcutaneous Q24H  . escitalopram  10 mg Oral Daily  . lip balm  1 application Topical BID  . mouth rinse  15 mL Mouth Rinse BID  . meropenem (MERREM) IV  1 g Intravenous Q8H  . pantoprazole  40 mg Oral Q1200  . protein supplement shake  11 oz Oral BID BM  . saccharomyces boulardii  250 mg Oral BID  . trimethoprim   100 mg Oral Daily  . vancomycin  125 mg Oral QID   . 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 Based on the CT results will determine future antibiotic course and duration of hospitalization.   LOS: 11 days    Austin [name] 10/08/2016 [phone]

## 2016-10-08 NOTE — Progress Notes (Signed)
Physical Therapy Treatment Patient Details Name: Austin [name] MRN: [id number] DOB: [birth year] Today's Date: 10/08/2016    History of Present Illness Pt is a 61 year old male with PMHx of brainstem CVA with residual quadriplegia and recurrent metastatic renal cell cancer.  He had a left radical nephrectomy in 2013. Peritoneal metastases were noted in 2015.  He tried systemic therapy, but did not tolerate it well.  Pt admitted with Sigmoid diverticulitis with small abscess and perforation with current plan for nonoperative management at this time    PT Comments    Pt feeling better.  Assisted OOB to personal wc.    Follow Up Recommendations  No PT follow up;Supervision/Assistance - 24 hour     Equipment Recommendations  None recommended by PT    Recommendations for Other Services       Precautions / Restrictions Precautions Precaution Comments: quadriplegia 25 years ago, intact sensation Restrictions Weight Bearing Restrictions: No    Mobility  Bed Mobility Overal bed mobility: Needs Assistance Bed Mobility: Supine to Sit       Sit to supine: Total assist   General bed mobility comments: assist for upper and lower body, spouse performed supine to sit -first moves LEs over EOB and then assisted trunk,  sitting EOB requires constant assist to prevent LOB  Transfers Overall transfer level: Needs assistance Equipment used: None Transfers: Sit to/from [employer] transfer comment: stand pivot 1/4 turn from elevated bed to personal wc  Ambulation/Gait             General Gait Details: nonambulatory   Stairs            Wheelchair Mobility    Modified Rankin (Stroke Patients Only)       Balance                                    Cognition Arousal/Alertness: Awake/alert Behavior During Therapy: WFL for tasks assessed/performed Overall Cognitive Status: Within Functional Limits for tasks  assessed                 General Comments: non verbal    Exercises      General Comments        Pertinent Vitals/Pain Pain Assessment: No/denies pain    Home Living                      Prior Function            PT Goals (current goals can now be found in the care plan section) Progress towards PT goals: Progressing toward goals    Frequency    Min 2X/week      PT Plan Current plan remains appropriate    Co-evaluation             End of Session Equipment Utilized During Treatment: Gait belt Activity Tolerance: Patient tolerated treatment well Patient left: in chair;with call bell/phone within reach;with family/visitor present   PT Visit Diagnosis: Other abnormalities of gait and mobility (R26.89)     Time: 5809-9833 PT Time Calculation (min) (ACUTE ONLY): 27 min  Charges:  $Therapeutic Activity: 23-37 mins                    G Codes:       Halvor Behrend  PTA [facility]  Acute  Rehab Pager      [phone]

## 2016-10-08 NOTE — Progress Notes (Signed)
Pharmacy Antibiotic Note  Austin [name] is a 61 y.o. male admitted on 09/27/2016 with Intra-abdominal infection .  Pharmacy has been consulted for meropenem dosing since 2/18  Plan: D12 total abx, D11 Meropenem, D3 PO Vanc for Cdiff colitis  Continue current meropenem dosing but strongly recommend discontinuing this medication since this is the cause of patient's Cdiff  Continue to follow renal function, cultures and clinical course  Height: 6' (182.9 cm) Weight: 173 lb 11.6 oz (78.8 kg) IBW/kg (Calculated) : 77.6  Temp (24hrs), Avg:98.3 F (36.8 C), Min:97.5 F (36.4 C), Max:99 F (37.2 C)   Recent Labs Lab 10/02/16 0500 10/04/16 0453 10/06/16 0414 10/08/16 0442  WBC 10.7* 10.5 9.2 6.6  CREATININE 1.11 1.07  --  1.20    Estimated Creatinine Clearance: 70.1 mL/min (by C-G formula based on SCr of 1.2 mg/dL).    Allergies  Allergen Reactions  . Penicillins Rash and Other (See Comments)    Has patient had a PCN reaction causing immediate rash, facial/tongue/throat swelling, SOB or lightheadedness with hypotension: No Has patient had a PCN reaction causing severe rash involving mucus membranes or skin necrosis: No Has patient had a PCN reaction that required hospitalization No Has patient had a PCN reaction occurring within the last 10 years: No If all of the above answers are "NO", then may proceed with Cephalosporin use.    Antimicrobials this admission:  2/17 Cipro/Flagyl x 1 2/18 Meropenem >> 2/18 trimethoprim PO >>  2/26 PO Vanc >>  Dose adjustments this admission:  2/19 increase meropenem to 1g q8h for improved SCr  Microbiology results:  2/6 Urine Cx: Serratia, res to Amox/clav, Cefaz  2/17 BCx: NGF 2/18 MRSA PCR: neg 2/23 surgical wound culture: NGTD 2/26 Cdiff: positive   Thank you for allowing pharmacy to be a part of this patient's care.  Adrian Saran, PharmD, BCPS Pager [phone] 10/08/2016 1:08 PM

## 2016-10-09 LAB — AEROBIC/ANAEROBIC CULTURE W GRAM STAIN (SURGICAL/DEEP WOUND): Culture: NO GROWTH

## 2016-10-09 LAB — AEROBIC/ANAEROBIC CULTURE (SURGICAL/DEEP WOUND)

## 2016-10-09 MED ORDER — CIPROFLOXACIN HCL 500 MG PO TABS: 500.0000 mg | ORAL_TABLET | Freq: Two times a day (BID) | ORAL | Status: DC

## 2016-10-09 MED ORDER — CIPROFLOXACIN HCL 500 MG PO TABS: 500.0000 mg | ORAL_TABLET | Freq: Two times a day (BID) | ORAL | 0 refills | Status: DC

## 2016-10-09 MED ORDER — METRONIDAZOLE 500 MG PO TABS: 500.0000 mg | ORAL_TABLET | Freq: Three times a day (TID) | ORAL | 0 refills | Status: DC

## 2016-10-09 MED ORDER — METRONIDAZOLE 500 MG PO TABS: 500.0000 mg | ORAL_TABLET | Freq: Three times a day (TID) | ORAL | Status: DC

## 2016-10-09 MED ORDER — ACETAMINOPHEN 325 MG PO TABS: 650.0000 mg | ORAL_TABLET | Freq: Four times a day (QID) | ORAL | Status: DC | PRN

## 2016-10-09 MED ORDER — TRIMETHOPRIM 100 MG PO TABS: ORAL_TABLET | ORAL | Status: DC

## 2016-10-09 MED ORDER — SULFAMETHOXAZOLE-TRIMETHOPRIM 800-160 MG PO TABS: 1.0000 | ORAL_TABLET | Freq: Two times a day (BID) | ORAL | 1 refills | Status: DC

## 2016-10-09 MED ORDER — SULFAMETHOXAZOLE-TRIMETHOPRIM 800-160 MG PO TABS: 1.0000 | ORAL_TABLET | Freq: Two times a day (BID) | ORAL | Status: DC

## 2016-10-09 MED ORDER — SACCHAROMYCES BOULARDII 250 MG PO CAPS: ORAL_CAPSULE | ORAL | Status: DC

## 2016-10-09 MED ORDER — VANCOMYCIN 50 MG/ML ORAL SOLUTION: 125.0000 mg | Freq: Four times a day (QID) | ORAL | 0 refills | Status: AC

## 2016-10-09 MED FILL — SULFAMETHOXAZOLE/TMP DS TAB: 800-160 | 7 days supply | Qty: 14 | Fill #0

## 2016-10-09 MED FILL — metroNIDAZOLE 500 MG TABS: 500 | 7 days supply | Qty: 21 | Fill #0

## 2016-10-09 MED FILL — VANCOMYCIN 50MG/ML ORAL SOL: 11 days supply | Qty: 200 | Fill #0

## 2016-10-09 NOTE — Progress Notes (Signed)
Subjective: He is doing well this a.m. And per the record, he is not taking much PO, but according to his wife he is eating normally for him. He denies any pain. Down to a couple bowel movements per day. Both he and his wife feel like he is ready for discharge home. He only wanted to know if he could have pizza.  Objective: Vital signs in last 24 hours: Temp:  [98.5 F (36.9 C)-98.8 F (37.1 C)] 98.5 F (36.9 C) (03/01 1062) Pulse Rate:  [59-66] 59 (03/01 0613) Resp:  [16] 16 (03/01 0613) BP: (151-164)/(56-70) 155/65 (03/01 0613) SpO2:  [95 %-98 %] 95 % (03/01 0613) Last BM Date: 10/08/16 PO 150 recorded 1400 IV 1700 urine BM x 2 Afebrile, VSS No labs today CT scan yesterday: Small residual abscess collection at the site of prior drainage procedure, decreased in size since the previous study. Peritoneal carcinomatosis again noted. Probable LEFT adrenal and splenic metastases. Persistent LEFT lower lobe pulmonary nodule. Small bibasilar pleural effusions and atelectasis. Small pericardial effusion increased since previous exam.  Intake/Output from previous day: 02/28 0701 - 03/01 0700 In: 1561.7 [P.O.:150; I.V.:1211.7; IV Piggyback:200] Out: 1700 [Urine:1700] Intake/Output this shift: Total I/O In: 120 [P.O.:120] Out: 500 [Urine:500]  General appearance: alert, cooperative, no distress and He has some coarse breath sounds upper airway again this a.m. Resp: clear to auscultation bilaterally and Some coarse upper airway breath sounds. GI: soft, non-tender; bowel sounds normal; no masses,  no organomegaly  Lab Results:   Recent Labs  10/08/16 0442  WBC 6.6  HGB 10.2*  HCT 31.4*  PLT 248    BMET  Recent Labs  10/08/16 0442  NA 140  K 4.0  CL 110  CO2 25  GLUCOSE 98  BUN 13  CREATININE 1.20  CALCIUM 8.2*   PT/INR No results for input(s): LABPROT, INR in the last 72 hours.   Recent Labs Lab 10/08/16 0442  AST 13*  ALT 12*  ALKPHOS 50  BILITOT  0.5  PROT 5.7*  ALBUMIN 2.3*     Lipase     Component Value Date/Time   LIPASE 22 09/27/2016 1823     Studies/Results: Ct Abdomen Pelvis W Contrast  Result Date: 10/08/2016 CLINICAL DATA:  Sigmoid diverticulitis post perforation and abscess formation treated by percutaneous drainage, history of metastatic renal cell carcinoma post LEFT radical nephrectomy in 2013 with peritoneal metastatic disease diagnosed in 2015 EXAM: CT ABDOMEN AND PELVIS WITH CONTRAST TECHNIQUE: Multidetector CT imaging of the abdomen and pelvis was performed using the standard protocol following bolus administration of intravenous contrast. Sagittal and coronal MPR images reconstructed from axial data set. Beam hardening artifacts from patient's arms traverse upper abdomen. CONTRAST:  145m ISOVUE-300 IOPAMIDOL (ISOVUE-300) INJECTION 61% IV. Oral contrast was not administered. COMPARISON:  10/02/2016; correlation CT guided abscess drainage images 10/03/2016 FINDINGS: Lower chest: Bibasilar pleural effusions and question emphysematous changes. LEFT lower lobe nodule 8 mm diameter. Small pericardial effusion, increased Hepatobiliary: Gallbladder surgically absent.  Liver unremarkable Pancreas: Normal appearance Spleen: Low-attenuation lesion in anterior spleen 4.6 x 4.3 x 4.7 cm question metastasis, stable. Adrenals/Urinary Tract: Post LEFT nephrectomy with scarring and fat necrosis at nephrectomy bed. Tiny exophytic nodule inferior pole RIGHT kidney, potentially enhancing, cannot exclude 9 mm tumor. RIGHT adrenal gland and RIGHT kidney otherwise normal appearance. No RIGHT hydronephrosis or ureteral calcification RIGHT/obstruction. Bladder unremarkable. Nodule identified within lateral limb of the LEFT adrenal gland question metastasis 18 x 15 mm, stable. Stomach/Bowel: Question appendix coiled next to  cecum. Diverticulosis of sigmoid colon with mild residual wall thickening and pericolic infiltrative changes at the proximal  sigmoid colon consistent with acute diverticulitis. Small adjacent abscess collection containing primarily gas, decreased in size since previous exam now 2.8 x 1.8 x 3.3 cm previously 3.4 x 2.5 x 3.5 cm. No abscess collection. Stomach and small bowel loops otherwise normal appearance. Vascular/Lymphatic: Large collaterals in the anterior abdomen supraumbilical. Atherosclerotic calcifications aorta without aneurysm. No adenopathy. Reproductive: Minimal prostatic enlargement. Seminal vesicles unremarkable. Other: Multiple enhancing peritoneal tumor implants consistent with peritoneal carcinomatosis. These include a macrolobulated mass in the RIGHT pericolic gutter 5.7 x 4.7 x 4.8 cm image 61 and an anterior abdominal mass measuring up to 5.7 x 6.9 x 7.9 cm. Small peritoneal implants adjacent to the liver and in the RIGHT pericolic gutter. Minimal ascites. No free air. No hernia. Musculoskeletal: No acute osseous findings. IMPRESSION: Small residual abscess collection at the site of prior drainage procedure, decreased in size since the previous study. Peritoneal carcinomatosis again noted. Probable LEFT adrenal and splenic metastases. Persistent LEFT lower lobe pulmonary nodule. Small bibasilar pleural effusions and atelectasis. Small pericardial effusion increased since previous exam. Electronically Signed   By: Lavonia Dana M.D.   On: 10/08/2016 12:35    Medications: . enoxaparin (LOVENOX) injection  40 mg Subcutaneous Q24H  . escitalopram  10 mg Oral Daily  . lip balm  1 application Topical BID  . mouth rinse  15 mL Mouth Rinse BID  . meropenem (MERREM) IV  1 g Intravenous Q8H  . pantoprazole  40 mg Oral Q1200  . protein supplement shake  11 oz Oral BID BM  . saccharomyces boulardii  250 mg Oral BID  . trimethoprim  100 mg Oral Daily  . 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 11 more days of by mouth vancomycin. He can follow-up on a when as needed with Dr. Excell Seltzer. Dr. Carolann Littler is his primary care and I recommended they follow-up with Dr. [name], after the antibiotic course is completed. He'll also follow-up with Dr. Valaria Good for cancer treatment and follow-up.   LOS: 12 days    Emmalyn Hinson 10/09/2016 [phone]

## 2016-10-09 NOTE — Discharge Summary (Signed)
Patient discharged home with wife.  Discharge instructions given to patient and wife.  Rxs for Septra, Acetaminophen, Augmentin, Ibuprofen, Florastor. Verified understanding.  Discharged home with no complaints.

## 2016-10-09 NOTE — Discharge Summary (Signed)
Physician Discharge Summary  Patient ID: Austin [name] MRN: [id number] DOB/AGE: 62/24/1957 62 y.o.  Admit date: 09/27/2016 Discharge date: 10/09/2016  Admission Diagnoses:  Sigmoid diverticulitis with small abscess perforation. 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  I am consulted by Dr. Leonette Monarch to see him for acute perforated diverticulitis.  He had breakfast normally, but then wanted to take some of his tramadol and go back to bed which was very unusual for him.  He complained to his wife of sharp LLQ pain.  He tried having a BM but this did not help the pain.  His wife called his doctor when this pain did not abate and worsened.   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  His wife and daughter have been assisting him with his care and he has not had a decubitus ulcer. Complicating this is recurrent metastatic renal cell cancer.  He had a left radical nephrectomy in 2013. Peritoneal metastases were noted in 2015.  He tried systemic therapy, but did not tolerate it well.   He has had a colonoscopy in the  past.  He had multiple polyps removed.  His brother had colon cancer and died at age 32.  He had a massive GI bleed and a CT was done as part of his care.  This is where the kidney tumor was discovered.  There is no note of diverticulosis on this scope.  His mother developed diverticulitis recently at age 76.   She was admitted by Dr. Barry Dienes and placed in the ICU. He was started on IV antibiotics. He had continued left lower quadrant pain, he seemed to have some issues with clearing secretions in the early a.m. His wife later told me that he is like that all the time, and it clears when he gets up to his chair. He was continued on medical management and made very slow progress. He was seen in consultation by Dr. Zola Button of the oncology service. She follows him for his progressive renal cell carcinoma with metastasis. She noted that he had an incurable malignancy and he was not a surgical candidate. Despite ongoing treatment he was not responding very quickly. IR was consulted for possible aspiration and drainage of the site. Dr. Earleen Newport was able to aspirate the site on 09/2316. Nothing grew from the culture. He finally started making some slow progress, after the aspiration. We repeated his CT scan on 10/08/16. This showed the abscess collection was decreased. His abdominal pain resolved. He did develop significant diarrhea, we checked him for C. difficile colitis and this was positive. He was started on oral vancomycin at that point. Over the next 48 hours he was able to increase  his by mouth intake, his diarrhea improved, his pain left lower quadrant improved. By 10/09/16 it was Dr. Lear Ng opinion he was ready to go home. We sent him home on Flagyl and Bactrim for 7 more days. He was to complete a total of 14 days of oral vancomycin for C. difficile colitis. He can restart his Trimethoprim after he completes his Bactrim and Flagyl. He will follow-up with Dr. [name] and Dr. Alen Blew. Our offices available  for follow-up at anytime should that be necessary.  CBC Latest Ref Rng & Units 10/08/2016 10/06/2016 10/04/2016  WBC 4.0 - 10.5 K/uL 6.6 9.2 10.5  Hemoglobin 13.0 - 17.0 g/dL 10.2(L) 10.2(L) 10.8(L)  Hematocrit 39.0 - 52.0 % 31.4(L) 31.4(L) 33.6(L)  Platelets 150 - 400 K/uL 248 264 255    CMP Latest Ref Rng & Units 10/08/2016 10/04/2016 10/02/2016  Glucose 65 - 99 mg/dL 98 102(H) 121(H)  BUN 6 - 20 mg/dL '13 10 18  '$ Creatinine 0.61 - 1.24 mg/dL 1.20 1.07 1.11  Sodium 135 - 145 mmol/L 140 139 139  Potassium 3.5 - 5.1 mmol/L 4.0 4.2 4.4  Chloride 101 - 111 mmol/L 110 114(H) 117(H)  CO2 22 - 32 mmol/L 25 21(L) 20(L)  Calcium 8.9 - 10.3 mg/dL 8.2(L) 8.0(L) 8.1(L)  Total Protein 6.5 - 8.1 g/dL 5.7(L) - -  Total Bilirubin 0.3 - 1.2 mg/dL 0.5 - -  Alkaline Phos 38 - 126 U/L 50 - -  AST 15 - 41 U/L 13(L) - -  ALT 17 - 63 U/L 12(L) - -    Condition ON discharge: Improving.   Disposition: 01-Home or Self Care   Allergies as of 10/09/2016      Reactions   Penicillins Rash, Other (See Comments)   Has patient had a PCN reaction causing immediate rash, facial/tongue/throat swelling, SOB or lightheadedness with hypotension: No Has patient had a PCN reaction causing severe rash involving mucus membranes or skin necrosis: No Has patient had a PCN reaction that required hospitalization No Has patient had a PCN reaction occurring within the last 10 years: No If all of the above answers are "NO", then may proceed with Cephalosporin use.      Medication List    TAKE these medications   acetaminophen 325 MG tablet Commonly known as:  TYLENOL Take 2 tablets (650 mg total) by mouth every 6 (six) hours as needed for mild pain (or temp > 100).   albuterol 108 (90 Base) MCG/ACT inhaler Commonly known as:  PROVENTIL HFA;VENTOLIN HFA Inhale 2 puffs into the lungs every 6 (six) hours as needed for wheezing or shortness of breath.   atorvastatin 20 MG tablet Commonly known as:  LIPITOR Take 20 mg by  mouth daily.   clopidogrel 75 MG tablet Commonly known as:  PLAVIX Take 75 mg by mouth at bedtime.   escitalopram 10 MG tablet Commonly known as:  LEXAPRO Take 10 mg by mouth daily.   ketoconazole 2 % cream Commonly known as:  NIZORAL Apply 1 application topically 2 (two) times daily as needed for irritation.   lisinopril-hydrochlorothiazide 20-12.5 MG tablet Commonly known as:  PRINZIDE,ZESTORETIC Take 1 tablet by mouth daily.   metFORMIN 500 MG tablet Commonly known as:  GLUCOPHAGE Take 500 mg by mouth at bedtime.   metroNIDAZOLE 500 MG tablet Commonly known as:  FLAGYL Take 1 tablet (500 mg total) by mouth every 8 (eight) hours.   multivitamin with minerals Tabs tablet Take 1 tablet by mouth daily.   saccharomyces boulardii 250  MG capsule Commonly known as:  FLORASTOR You can by this over-the-counter and take as directed by the instructions for the next 30 days.   sulfamethoxazole-trimethoprim 800-160 MG tablet Commonly known as:  BACTRIM DS,SEPTRA DS Take 1 tablet by mouth 2 (two) times daily.   testosterone enanthate 200 MG/ML injection Commonly known as:  DELATESTRYL Inject 200 mg into the muscle every 14 (fourteen) days. For IM use only   traMADol 50 MG tablet Commonly known as:  ULTRAM Take 50 mg by mouth every 6 (six) hours as needed for moderate pain.   traZODone 100 MG tablet Commonly known as:  DESYREL Take 100 mg by mouth at bedtime as needed for sleep.   triamcinolone cream 0.1 % Commonly known as:  KENALOG Apply 1 application topically 2 (two) times daily as needed (for rash).   trimethoprim 100 MG tablet Commonly known as:  TRIMPEX Restart this after you have completed the Bactrim and Flagyl course. What changed:  how much to take  how to take this  when to take this  additional instructions   vancomycin 50 mg/mL oral solution Commonly known as:  VANCOCIN Take 2.5 mLs (125 mg total) by mouth 4 (four) times daily.      Follow-up  Information    HOXWORTH,BENJAMIN T, MD Follow up.   Specialty:  General Surgery Why:  Call and follow-up as needed. Contact information: [address] [place] [address] [phone]        [name],BRUCE W, MD Follow up.   Specialty:  Family Medicine Why:  Call and let him check on you after all the antibiotics are completed. Contact information: [place] [address] [phone]        [facility], MD Follow up.   Specialty:  Oncology Why:  Call and follow-up for his cancer. Contact information: [address] [place] [address] [phone]           Signed: [name] 10/09/2016, 7:16 PM

## 2016-10-09 NOTE — Progress Notes (Addendum)
Spoke with patient and spouse at bedside. Spouse provides care for patient at home, they have a van for transportation, he has a specialized w/c for mobility. Per spouse no HH needs, she will continue to manage care at home. [phone]

## 2016-10-09 NOTE — Discharge Instructions (Signed)
Diverticulitis Diverticulitis is inflammation or infection of small pouches in your colon that form when you have a condition called diverticulosis. The pouches in your colon are called diverticula. Your colon, or large intestine, is where water is absorbed and stool is formed. Complications of diverticulitis can include:  Bleeding.  Severe infection.  Severe pain.  Perforation of your colon.  Obstruction of your colon. What are the causes? Diverticulitis is caused by bacteria. Diverticulitis happens when stool becomes trapped in diverticula. This allows bacteria to grow in the diverticula, which can lead to inflammation and infection. What increases the risk? People with diverticulosis are at risk for diverticulitis. Eating a diet that does not include enough fiber from fruits and vegetables may make diverticulitis more likely to develop. What are the signs or symptoms? Symptoms of diverticulitis may include:  Abdominal pain and tenderness. The pain is normally located on the left side of the abdomen, but may occur in other areas.  Fever and chills.  Bloating.  Cramping.  Nausea.  Vomiting.  Constipation.  Diarrhea.  Blood in your stool. How is this diagnosed? Your health care provider will ask you about your medical history and do a physical exam. You may need to have tests done because many medical conditions can cause the same symptoms as diverticulitis. Tests may include:  Blood tests.  Urine tests.  Imaging tests of the abdomen, including X-rays and CT scans. When your condition is under control, your health care provider may recommend that you have a colonoscopy. A colonoscopy can show how severe your diverticula are and whether something else is causing your symptoms. How is this treated? Most cases of diverticulitis are mild and can be treated at home. Treatment may include:  Taking over-the-counter pain medicines.  Following a clear liquid diet.  Taking  antibiotic medicines by mouth for 7-10 days. More severe cases may be treated at a hospital. Treatment may include:  Not eating or drinking.  Taking prescription pain medicine.  Receiving antibiotic medicines through an IV tube.  Receiving fluids and nutrition through an IV tube.  Surgery. Follow these instructions at home:  Follow your health care providers instructions carefully.  Follow a full liquid diet or other diet as directed by your health care provider. After your symptoms improve, your health care provider may tell you to change your diet. He or she may recommend you eat a high-fiber diet. Fruits and vegetables are good sources of fiber. Fiber makes it easier to pass stool.  Take fiber supplements or probiotics as directed by your health care provider.  Only take medicines as directed by your health care provider.  Keep all your follow-up appointments. Contact a health care provider if:  Your pain does not improve.  You have a hard time eating food.  Your bowel movements do not return to normal. Get help right away if:  Your pain becomes worse.  Your symptoms do not get better.  Your symptoms suddenly get worse.  You have a fever.  You have repeated vomiting.  You have bloody or black, tarry stools. This information is not intended to replace advice given to you by your health care provider. Make sure you discuss any questions you have with your health care provider. Document Released: 05/07/2005 Document Revised: 01/03/2016 Document Reviewed: 06/22/2013 Elsevier Interactive Patient Education  2017 [employer]   Clostridium Difficile Infection  Clostridium difficile (C. difficile or C. diff) infection causes inflammation of the large intestine (colon). This condition can result in damage  to the lining of your colon and may lead to another condition called colitis. This infection can be passed from person to person (is contagious). Follow these  instructions at home: Eating and drinking   Drink enough fluid to keep your pee (urine) clear or pale yellow.  Avoid drinking:  Milk.  Caffeine.  Alcohol.  Follow exact instructions from your doctor about how to get enough fluid in your body (rehydrate).  Eat small meals often instead of large meals. Medicines   Take your antibiotic medicine as told by your doctor. Do not stop taking the antibiotic even if you start to feel better unless your doctor told you to do that.  Take over-the-counter and prescription medicines only as told by your doctor.  Do not use medicines to help with watery poop (diarrhea). General instructions   Wash your hands fully before you prepare food and after you use the bathroom. Make sure people who live with you also wash their  hands often.  Clean the surfaces that you touch. Use a product that contains chlorine bleach.  Keep all follow-up visits as told by your doctor. This is important. Contact a doctor if:  Your symptoms do not get better with treatment.  Your symptoms get worse with treatment.  Your symptoms go away and then come back.  You have a fever.  You have new symptoms. Get help right away if:  You have more pain or tenderness in your belly (abdomen).  Your poop (stool) is mostly bloody.  Your poop looks dark black and tarry.  You cannot eat or drink without throwing up (vomiting).  You have signs of dehydration, such as:  Dark pee, very little pee, or no pee.  Cracked lips.  Not making tears when you cry.  Dry mouth.  Sunken eyes.  Feeling sleepy.  Feeling weak.  Feeling dizzy. This information is not intended to replace advice given to you by your health care provider. Make sure you discuss any questions you have with your health care provider. Document Released: 05/25/2009 Document Revised: 01/03/2016 Document Reviewed: 01/29/2015 Elsevier Interactive Patient Education  2017 [employer].

## 2016-10-21 ENCOUNTER — Encounter: Payer: Self-pay | Admitting: Family Medicine

## 2016-10-21 ENCOUNTER — Ambulatory Visit (INDEPENDENT_AMBULATORY_CARE_PROVIDER_SITE_OTHER): Payer: PPO | Admitting: Family Medicine

## 2016-10-21 ENCOUNTER — Encounter: Payer: Self-pay | Admitting: *Deleted

## 2016-10-21 VITALS — BP 120/78 | HR 80 | Temp 98.1°F

## 2016-10-21 DIAGNOSIS — K5720 Diverticulitis of large intestine with perforation and abscess without bleeding: Secondary | ICD-10-CM | POA: Diagnosis not present

## 2016-10-21 DIAGNOSIS — C649 Malignant neoplasm of unspecified kidney, except renal pelvis: Secondary | ICD-10-CM

## 2016-10-21 DIAGNOSIS — R195 Other fecal abnormalities: Secondary | ICD-10-CM

## 2016-10-21 DIAGNOSIS — E119 Type 2 diabetes mellitus without complications: Secondary | ICD-10-CM | POA: Diagnosis not present

## 2016-10-21 NOTE — Progress Notes (Signed)
Subjective:     Patient ID: Austin [name], male   DOB: [birth year], 61 y.o.   MRN: [id number]  HPI Patient seen for hospital follow-up.  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  He was evaluated in the ED on 12/16 with left lower quadrant pain. He had no known history of diverticulosis. CT abdomen and pelvis revealed diverticulitis with abscess and perforation. He was covered with broad-spectrum antibiotics. Was felt he was not a good surgical candidate. Interventional radiology was consulted and they performed aspiration and drainage of the site on 10/03/16. Culture grew out nothing.  He continued to make slow progress but developed some diarrhea which ended up positive for C. difficile colitis. He was treated with vancomycin along with Flagyl and diarrhea essentially resolved. Off antibiotics this time. He is still having some loose stools twice states about 2-3 per day but no watery stools. No bloody stools. No abdominal pain. No recurrent fevers or chills. Appetite is slowly improving. Wife is giving supplements with Florastor and Premier-nutritional supplement.  He still some fatigue and lethargy but overall improved. Blood sugars have been very stable- below 100 consistently. Currently only takes metformin 500 mg once daily for diabetes. Recent A1c 6.0%.  Past Medical History:  Diagnosis Date  . Cerebrovascular accident ([place]) 1994   hx of brainstem stroke, residual diminished lung capacity  . Chronic kidney disease    Left Renal Mass  . Diabetes mellitus without complication ([place])   . Hx of colonic polyps   . Hypertension   . Hypogonadism   . 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 Neuromuscular  disorder ([place])    quadraplegic  . Pituitary adenoma ([place])   . 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 Sleep apnea    stopbang=4  . Urinary incontinence   . Venous stasis    edema   Past Surgical History:  Procedure Laterality Date  . CHOLECYSTECTOMY    . COLONOSCOPY  02/27/2012   Procedure: COLONOSCOPY;  Surgeon: Jerene Bears, MD;  Location: [facility];  Service: Gastroenterology;  Laterality: N/A;  . gamma knife radiation surgery  05/2010   for pituitary  . PITUITARY SURGERY  2007, 2011  . ROBOT ASSISTED LAPAROSCOPIC NEPHRECTOMY  04/23/2012   Procedure: ROBOTIC ASSISTED LAPAROSCOPIC NEPHRECTOMY;  Surgeon: Alexis Frock, MD;  Location: [facility];  Service: Urology;  Laterality: Left;  radical  . Robotic Partial Nephrectomy  04-23-12   left   Left Renal Mass  . stomach peg  02/1993   removed 5-6 years later  . TRACHEOSTOMY TUBE PLACEMENT  02/1993   removed 04/1993  . UMBILICAL HERNIA REPAIR  04/23/2012   Procedure: HERNIA REPAIR UMBILICAL ADULT;  Surgeon: Alexis Frock, MD;  Location: [facility];  Service: Urology;;  . [name]  . vocal cord surgery     injected with collagen, then fat from stomach to improve speech s/p stroke    reports that he quit smoking about 24 years ago. His smoking use included Cigarettes. He has a 11.50 pack-year smoking history. He has never used smokeless tobacco. He reports that he does not drink alcohol or use drugs. family history includes Alcohol abuse in his father; Arthritis in his maternal grandmother and mother; Colon  cancer in his brother; Esophageal cancer (age of onset: 33) in his father; Hyperlipidemia in his mother; Hypertension in his brother; Stroke in his paternal grandmother; Throat cancer in his father; Uterine cancer (age of onset: 81) in his mother. Allergies  Allergen Reactions  . Penicillins Rash and Other (See Comments)    Has patient had a PCN reaction causing immediate rash,  facial/tongue/throat swelling, SOB or lightheadedness with hypotension: No Has patient had a PCN reaction causing severe rash involving mucus membranes or skin necrosis: No Has patient had a PCN reaction that required hospitalization No Has patient had a PCN reaction occurring within the last 10 years: No If all of the above answers are "NO", then may proceed with Cephalosporin use.     Review of Systems  Constitutional: Positive for fatigue. Negative for chills and fever.  Eyes: Negative for visual disturbance.  Respiratory: Negative for cough, chest tightness and shortness of breath.   Cardiovascular: Negative for chest pain, palpitations and leg swelling.  Gastrointestinal: Negative for abdominal pain, blood in stool, constipation, nausea and vomiting.  Genitourinary: Negative for dysuria.  Neurological: Negative for dizziness, syncope, weakness, light-headedness and headaches.       Objective:   Physical Exam  Constitutional: He appears well-developed and well-nourished.  HENT:  Mouth/Throat: Oropharynx is clear and moist.  Neck: Neck supple. No thyromegaly present.  Cardiovascular: Normal rate and regular rhythm.   Pulmonary/Chest: Effort normal and breath sounds normal. No respiratory distress. He has no wheezes. He has no rales.  Lymphadenopathy:    He has no cervical adenopathy.  Neurological: He is alert.       Assessment:     #1 recent acute sigmoid diverticulitis with perforation and abscess clinically improving and now off antibiotics with interventional radiology drainage as above  #2 metastatic renal cell carcinoma with patient not pursuing any further treatments and followed by oncology  #3 loose stools. Doubt recurrent C. 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

## 2016-10-21 NOTE — Progress Notes (Signed)
Pre visit review using our clinic review tool, if applicable. No additional management support is needed unless otherwise documented below in the visit note. 

## 2016-10-21 NOTE — Patient Instructions (Signed)
Hold the metformin for now Monitor blood sugars and consider starting back the metformin for fasting sugars > 130. Follow up promptly for any fever or recurrent abdominal pain.

## 2016-10-23 ENCOUNTER — Other Ambulatory Visit: Payer: Self-pay | Admitting: Family Medicine

## 2016-11-03 ENCOUNTER — Telehealth: Payer: Self-pay | Admitting: Family Medicine

## 2016-11-03 ENCOUNTER — Other Ambulatory Visit (INDEPENDENT_AMBULATORY_CARE_PROVIDER_SITE_OTHER): Payer: PPO

## 2016-11-03 DIAGNOSIS — R197 Diarrhea, unspecified: Secondary | ICD-10-CM

## 2016-11-03 NOTE — Telephone Encounter (Signed)
Wife aware order has been placed, she will bring sample to lab today.

## 2016-11-03 NOTE — Telephone Encounter (Signed)
Pt is has diarrhea and his wife state that he has been off of his antibiotics for 3 days and would like to bring in a stool sample to see if something major is going on that will require him to have an antibiotic.  Can she bring the stool sample in?

## 2016-11-03 NOTE — Telephone Encounter (Signed)
Wife states pt is having more than that a day. Pt is getting weaker and weaker. Wife aware ok to bring in stooll sample. Per sheena

## 2016-11-03 NOTE — Telephone Encounter (Signed)
If having > 6 stools per day, would check stool PCR for C dif.

## 2016-11-04 ENCOUNTER — Other Ambulatory Visit: Payer: Self-pay | Admitting: *Deleted

## 2016-11-04 ENCOUNTER — Telehealth: Payer: Self-pay

## 2016-11-04 LAB — CLOSTRIDIUM DIFFICILE BY PCR: Toxigenic C. Difficile by PCR: DETECTED — CR

## 2016-11-04 MED ORDER — METRONIDAZOLE 500 MG PO TABS: 500.0000 mg | ORAL_TABLET | Freq: Three times a day (TID) | ORAL | 0 refills | Status: DC

## 2016-11-04 NOTE — Telephone Encounter (Signed)
Cam[facility] @ [facility] called with CALL REPORT:  C Diff is POSITIVE  Dr. [name] notified.

## 2016-11-05 ENCOUNTER — Telehealth: Payer: Self-pay

## 2016-11-05 ENCOUNTER — Encounter: Payer: Self-pay | Admitting: *Deleted

## 2016-11-05 ENCOUNTER — Telehealth: Payer: Self-pay | Admitting: *Deleted

## 2016-11-05 NOTE — Telephone Encounter (Signed)
Wife Austin [name] calling to say patient is home from the hospital, States Sunday night was horrible, diarrhea running down his leg out of his jeans. pcp tested him for c-diff and he is positive. Back on antibiotics. Extremely weak. Wife states she is having more trouble taking care of him. He is barely able to hold himself up on bar so that she may clean him after stools. She is wondering about palliative care at this point. Encouraged her to discuss this with patient and then discuss with dr Alen Blew at f/u visit on 11-12-16. She knows to call in the meantime, should she need additional help.

## 2016-11-05 NOTE — Telephone Encounter (Signed)
Wife has 2 questions. Pt was in hospital not to do with cancer but he is very weak. 2/17 to 3/1 in hospital with abcess and perforated bowel and c-diff. Possibly start the process of palliative care. Pt was approved for PET but it has not been scheduled.  Pt and wife do want to keep 4/3 appt even if PET not done yet b/c of arranging family coming with him.

## 2016-11-11 ENCOUNTER — Ambulatory Visit (HOSPITAL_BASED_OUTPATIENT_CLINIC_OR_DEPARTMENT_OTHER): Payer: PPO | Admitting: Oncology

## 2016-11-11 ENCOUNTER — Telehealth: Payer: Self-pay | Admitting: Oncology

## 2016-11-11 ENCOUNTER — Other Ambulatory Visit (HOSPITAL_BASED_OUTPATIENT_CLINIC_OR_DEPARTMENT_OTHER): Payer: PPO

## 2016-11-11 VITALS — BP 123/64 | HR 77 | Temp 97.7°F | Resp 18 | Ht 72.0 in

## 2016-11-11 DIAGNOSIS — C649 Malignant neoplasm of unspecified kidney, except renal pelvis: Secondary | ICD-10-CM

## 2016-11-11 DIAGNOSIS — C642 Malignant neoplasm of left kidney, except renal pelvis: Secondary | ICD-10-CM | POA: Diagnosis not present

## 2016-11-11 DIAGNOSIS — C786 Secondary malignant neoplasm of retroperitoneum and peritoneum: Secondary | ICD-10-CM

## 2016-11-11 DIAGNOSIS — A0472 Enterocolitis due to Clostridium difficile, not specified as recurrent: Secondary | ICD-10-CM | POA: Diagnosis not present

## 2016-11-11 DIAGNOSIS — R5383 Other fatigue: Secondary | ICD-10-CM | POA: Diagnosis not present

## 2016-11-11 LAB — COMPREHENSIVE METABOLIC PANEL
ALBUMIN: 3.1 g/dL — AB (ref 3.5–5.0)
ALK PHOS: 63 U/L (ref 40–150)
ALT: 18 U/L (ref 0–55)
ANION GAP: 9 meq/L (ref 3–11)
AST: 14 U/L (ref 5–34)
BUN: 42.8 mg/dL — ABNORMAL HIGH (ref 7.0–26.0)
CO2: 17 meq/L — AB (ref 22–29)
Calcium: 9.4 mg/dL (ref 8.4–10.4)
Chloride: 109 mEq/L (ref 98–109)
Creatinine: 1.5 mg/dL — ABNORMAL HIGH (ref 0.7–1.3)
EGFR: 50 mL/min/{1.73_m2} — AB (ref >90)
GLUCOSE: 105 mg/dL (ref 70–140)
POTASSIUM: 4.6 meq/L (ref 3.5–5.1)
Sodium: 135 mEq/L — ABNORMAL LOW (ref 136–145)
Total Protein: 7.7 g/dL (ref 6.4–8.3)

## 2016-11-11 LAB — CBC WITH DIFFERENTIAL/PLATELET
BASO%: 0.9 % (ref 0.0–2.0)
BASOS ABS: 0.1 10*3/uL (ref 0.0–0.1)
EOS ABS: 0.4 10*3/uL (ref 0.0–0.5)
EOS%: 3 % (ref 0.0–7.0)
HEMATOCRIT: 39.4 % (ref 38.4–49.9)
HGB: 13 g/dL (ref 13.0–17.1)
LYMPH#: 1.3 10*3/uL (ref 0.9–3.3)
LYMPH%: 9.7 % — ABNORMAL LOW (ref 14.0–49.0)
MCH: 27.5 pg (ref 27.2–33.4)
MCHC: 33 g/dL (ref 32.0–36.0)
MCV: 83.4 fL (ref 79.3–98.0)
MONO#: 1.7 10*3/uL — AB (ref 0.1–0.9)
MONO%: 12.9 % (ref 0.0–14.0)
NEUT#: 9.8 10*3/uL — ABNORMAL HIGH (ref 1.5–6.5)
NEUT%: 73.5 % (ref 39.0–75.0)
PLATELETS: 316 10*3/uL (ref 140–400)
RBC: 4.73 10*6/uL (ref 4.20–5.82)
RDW: 17 % — ABNORMAL HIGH (ref 11.0–14.6)
WBC: 13.3 10*3/uL — ABNORMAL HIGH (ref 4.0–10.3)

## 2016-11-11 NOTE — Telephone Encounter (Signed)
Gave patient AVS and calender per 11/11/2016 los. 4 month f/u

## 2016-11-11 NOTE — Progress Notes (Signed)
Hematology and Oncology Follow Up Visit  Austin [name] [id number] 1955-09-02 61 y.o. 11/11/2016 9:55 AM Austin [name], MDBurchette, Austin Sierras, MD   Principle Diagnosis: 61 year old man with a renal cell carcinoma diagnosed in 2013 he presented with a 4.2 cm mass in the lower pole of the left kidney. Now he has stage IV disease. He has peritoneal involvement as well as splenic metastasis. 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 He is status [name] robotic-assisted laparoscopic nephrectomy on the left done on 04/23/2012. The pathology revealed renal cell carcinoma with a grade 3/4 with the pathological staging of T3a.   2. Votrient 800 mg daily started around 12/27/2013. This was reduced to 400 mg subsequently and have been discontinued since 02/03/2014 due to poor tolerance.  Current therapy: Observation and surveillance.  Interim History: Austin [name] presents today for a followup visit with his wife. Since his last visit, he was hospitalized for sigmoid diverticulitis and perforation and abscess formation between 09/27/2016 and 10/09/2016. He had a prolonged hospitalization and required antibiotic treatment to avoid surgical intervention and possible colostomy. He was diagnosed with C. difficile colitis and was treated initially with Flagyl and vancomycin and was discharged home. He did have a relapse of his C. difficile colitis and Flagyl was restarted again.  Since his discharge, he is improving slowly although still quite debilitated from his recent illness. His wife reports that he is getting stronger but still profoundly weak. Diarrhea is improving which has been devastating for him as a quadriplegic. He does not report any severe pain at this time other than chronic discomfort in his abdomen. His appetite is improving although lost significant amount of weight. He is denying any fevers or chills or vomiting. He appears to be slowly  improving clinically.   He had any headaches, blurry vision, or seizures. He does not report any fevers or chills or sweats. Has not reported chest pain or difficulty breathing. Has not reported any nausea, vomiting or early satiety. Has not reported any hematochezia or melena. He has not reported any arthralgias or myalgias. As that report any lymphadenopathy or petechiae. Is not reporting any bleeding complications. Rest of review of systems unremarkable.  Medications: I have reviewed the patient's current medications.  Current Outpatient Prescriptions  Medication Sig Dispense Refill  . acetaminophen (TYLENOL) 325 MG tablet Take 2 tablets (650 mg total) by mouth every 6 (six) hours as needed for mild pain (or temp > 100).    Marland Kitchen albuterol (PROVENTIL HFA;VENTOLIN HFA) 108 (90 BASE) MCG/ACT inhaler Inhale 2 puffs into the lungs every 6 (six) hours as needed for wheezing or shortness of breath. 1 Inhaler 0  . atorvastatin (LIPITOR) 20 MG tablet Take 20 mg by mouth daily.    . clopidogrel (PLAVIX) 75 MG tablet Take 75 mg by mouth at bedtime.    . clopidogrel (PLAVIX) 75 MG tablet TAKE ONE TABLET BY MOUTH AT BEDTIME 90 tablet 1  . escitalopram (LEXAPRO) 10 MG tablet Take 10 mg by mouth daily.    Marland Kitchen ketoconazole (NIZORAL) 2 % cream Apply 1 application topically 2 (two) times daily as needed for irritation.    Marland Kitchen ketoconazole (NIZORAL) 2 % cream APPLY AS NEEDED TO RASH 60 g 1  . lisinopril-hydrochlorothiazide (PRINZIDE,ZESTORETIC) 20-12.5 MG tablet Take 1 tablet by mouth daily.    . metFORMIN (GLUCOPHAGE) 500 MG tablet Take 500 mg by mouth at bedtime.     Marland Kitchen  metroNIDAZOLE (FLAGYL) 500 MG tablet Take 1 tablet (500 mg total) by mouth 3 (three) times daily. 42 tablet 0  . Multiple Vitamin (MULTIVITAMIN WITH MINERALS) TABS tablet Take 1 tablet by mouth daily.    Marland Kitchen saccharomyces boulardii (FLORASTOR) 250 MG capsule You can by this over-the-counter and take as directed by the instructions for the next 30 days.     Marland Kitchen testosterone enanthate (DELATESTRYL) 200 MG/ML injection Inject 200 mg into the muscle every 14 (fourteen) days. For IM use only    . traMADol (ULTRAM) 50 MG tablet Take 50 mg by mouth every 6 (six) hours as needed for moderate pain.    . traZODone (DESYREL) 100 MG tablet Take 100 mg by mouth at bedtime as needed for sleep.    Marland Kitchen triamcinolone cream (KENALOG) 0.1 % Apply 1 application topically 2 (two) times daily as needed (for rash).    . trimethoprim (TRIMPEX) 100 MG tablet Restart this after you have completed the Bactrim and Flagyl course.     No current facility-administered medications for this visit.      Allergies:  Allergies  Allergen Reactions  . Penicillins Rash and Other (See Comments)    Has patient had a PCN reaction causing immediate rash, facial/tongue/throat swelling, SOB or lightheadedness with hypotension: No Has patient had a PCN reaction causing severe rash involving mucus membranes or skin necrosis: No Has patient had a PCN reaction that required hospitalization No Has patient had a PCN reaction occurring within the last 10 years: No If all of the above answers are "NO", then may proceed with Cephalosporin use.    Past Medical History, Surgical history, Social history, and Family History were reviewed and updated.    Physical Exam: Blood pressure 123/64, pulse 77, temperature 97.7 F (36.5 C), temperature source Oral, resp. rate 18, height 6' (1.829 m), SpO2 100 %. ECOG: 2 General appearance: Well-appearing gentleman appeared comfortable today. Head: Normocephalic, without obvious abnormality. No oral thrush or ulcers. Neck: no adenopathy or thyroid masses. Lymph nodes: Cervical, supraclavicular, and axillary nodes normal. Heart:regular rate and rhythm, S1, S2 normal, no murmur, click, rub or gallop Lung:chest clear, no wheezing, rales, no dullness to percussion.  Abdomin: soft, non-tender, without masses or organomegaly no rebound or guarding. EXT: No  edema noted.   Lab Results: Lab Results  Component Value Date   WBC 13.3 (H) 11/11/2016   HGB 13.0 11/11/2016   HCT 39.4 11/11/2016   MCV 83.4 11/11/2016   PLT 316 11/11/2016     Chemistry      Component Value Date/Time   NA 140 10/08/2016 0442   NA 136 07/11/2016 0907   K 4.0 10/08/2016 0442   K 5.7 (H) 07/11/2016 0907   CL 110 10/08/2016 0442   CO2 25 10/08/2016 0442   CO2 16 (L) 07/11/2016 0907   BUN 13 10/08/2016 0442   BUN 32.8 (H) 07/11/2016 0907   CREATININE 1.20 10/08/2016 0442   CREATININE 1.8 (H) 07/11/2016 0907      Component Value Date/Time   CALCIUM 8.2 (L) 10/08/2016 0442   CALCIUM 9.3 07/11/2016 0907   ALKPHOS 50 10/08/2016 0442   ALKPHOS 103 07/11/2016 0907   AST 13 (L) 10/08/2016 0442   AST 16 07/11/2016 0907   ALT 12 (L) 10/08/2016 0442   ALT 25 07/11/2016 0907   BILITOT 0.5 10/08/2016 0442   BILITOT 0.24 07/11/2016 0907       Impression and Plan:  61 year old gentleman with the following issues:  1. Renal  cell carcinoma diagnosed with a left renal mass in July of 2013 and after a nephrectomy developed peritoneal and retroperitoneal metastasis. He was started on Votrient in May of 2015 but was discontinued due to poor tolerance.   He has been on observation and surveillance since that time. Last CT scan obtained on 10/08/2016 continue to show peritoneal metastasis which has not dramatically changed.  He understands he is an incurable malignancy that is slowly progressing. He remains asymptomatic from this illness but knows he will likely develop further decline related to this cancer. His current clinical status is not dramatically affected by the his cancer.  2. Fatigue and functional decline: Multifactorial in nature and related due to C. difficile colitis and underlying malignancy. He appears to be improving slowly from his recent hospitalization.  3. Abdominal pain: Appears to be manageable at this time. He has not taken any pain  medication.  4. C. difficile colitis: He is back on Flagyl and diarrhea is improving.  5. Prognosis: He has an incurable malignancy that is progressing at this time. He will likely require hospice enrollment if his clinical status declines in the future. He is a recent decline is likely related to his recent hospitalization and C. difficile colitis. This appears to be improving although the underlying malignancy still likely dictate his life expectancy.  6. Followup: In August 2018.  [facility], MD 4/3/20189:55 AM

## 2016-11-17 ENCOUNTER — Telehealth: Payer: Self-pay | Admitting: Family Medicine

## 2016-11-17 ENCOUNTER — Other Ambulatory Visit: Payer: PPO

## 2016-11-17 ENCOUNTER — Telehealth: Payer: Self-pay | Admitting: *Deleted

## 2016-11-17 DIAGNOSIS — A0472 Enterocolitis due to Clostridium difficile, not specified as recurrent: Secondary | ICD-10-CM

## 2016-11-17 NOTE — Telephone Encounter (Signed)
FYI

## 2016-11-17 NOTE — Telephone Encounter (Signed)
Pts wife state that pt saw Dr. Hilarie Fredrickson today and will be doing a C-Diff on Thursday and will go from there.

## 2016-11-17 NOTE — Telephone Encounter (Signed)
Patient was diagnosed with c diff recently and will be completing flagyl tomorrow. Per wife, patient's bowel habits are still not back to normal. Per Dr Hilarie Fredrickson, order C Diff PCR to confirm c diff negative status.

## 2016-11-20 ENCOUNTER — Other Ambulatory Visit: Payer: PPO

## 2016-11-20 DIAGNOSIS — A0472 Enterocolitis due to Clostridium difficile, not specified as recurrent: Secondary | ICD-10-CM

## 2016-11-21 ENCOUNTER — Telehealth: Payer: Self-pay | Admitting: *Deleted

## 2016-11-21 LAB — CLOSTRIDIUM DIFFICILE BY PCR: CDIFFPCR: DETECTED — AB

## 2016-11-21 MED ORDER — VANCOMYCIN HCL 250 MG PO CAPS: 250.0000 mg | ORAL_CAPSULE | Freq: Four times a day (QID) | ORAL | 0 refills | Status: DC

## 2016-11-21 NOTE — Telephone Encounter (Signed)
I have spoken to patient's wife (caregiver) to advise of positive C Diff result and to advise that we have sent vancomycin 250 mg four times daily for him to take for 14 days. I have also advised for patient to take florastor but Mrs. Loh states that this previously caused abdominal pain. Therefore she will just give patient the vancomycin. I advised that if this does not completely resolve diarrhea, patient will need to be seen in office to be reevaluated. She verbalizes understanding of this.

## 2016-11-21 NOTE — Telephone Encounter (Signed)
Please start Vancomycin 250 mg po 4 x daily  for 14 days, and florastor one twice daily x one month.- if diarrhea does not resolve pt will need to be seen , and evaluated

## 2016-11-21 NOTE — Telephone Encounter (Signed)
(  Pyrtle ordered) Patient is C Diff positive after recent flagyl 500 mg tid 14 day course. Please advise.Marland KitchenMarland KitchenMarland Kitchen

## 2016-11-24 DIAGNOSIS — Z993 Dependence on wheelchair: Secondary | ICD-10-CM | POA: Diagnosis not present

## 2016-11-24 DIAGNOSIS — G8250 Quadriplegia, unspecified: Secondary | ICD-10-CM | POA: Diagnosis not present

## 2016-11-24 DIAGNOSIS — Z7409 Other reduced mobility: Secondary | ICD-10-CM | POA: Diagnosis not present

## 2016-11-24 MED FILL — VANCOMYCIN 50MG/ML ORAL SOL: 14 days supply | Qty: 280 | Fill #0

## 2016-11-28 ENCOUNTER — Ambulatory Visit: Payer: PPO | Admitting: Family Medicine

## 2016-12-08 ENCOUNTER — Telehealth: Payer: Self-pay | Admitting: *Deleted

## 2016-12-08 ENCOUNTER — Telehealth: Payer: Self-pay | Admitting: Internal Medicine

## 2016-12-08 ENCOUNTER — Telehealth: Payer: Self-pay | Admitting: Family Medicine

## 2016-12-08 NOTE — Telephone Encounter (Signed)
Left message on machine to call back  

## 2016-12-08 NOTE — Telephone Encounter (Signed)
Spoke with wife and we are waiting for paperwork to be faxed to the office

## 2016-12-08 NOTE — Telephone Encounter (Signed)
Dr Hilarie Fredrickson, please see Gloria's echart note below and advise....   Good Afternoon,    I wanted to touch base with Dr. Raquel James today about what is the next step in Bill's c diff treatment. Bill finished his vancomycin on Friday April 27. Does he need to have another stool sample checked? He isn't having watery diarrhea but his stool is still soft and pasty. Thank you for getting back to [place] as soon as possible about the next step.    Zenaida Deed

## 2016-12-08 NOTE — Telephone Encounter (Signed)
pts wife calling stating that there is more information needed for the pts wheelchair.

## 2016-12-09 NOTE — Telephone Encounter (Signed)
Ive spoken to Austin [name] to advise of Dr Vena Rua recommendations and she verbalizes understanding.

## 2016-12-09 NOTE — Telephone Encounter (Signed)
I would not recommend rechecking for C diff unless he is having diarrhea. Sometimes this test can be confusing when not ordered for definite diarrhea or persistent, freq, loose stools When/if this occurs we can proceed with repeat C diff PCR ordered STAT to speed processing time

## 2016-12-11 NOTE — Telephone Encounter (Signed)
Pt did not call back

## 2016-12-22 ENCOUNTER — Encounter: Payer: Self-pay | Admitting: Family Medicine

## 2016-12-22 ENCOUNTER — Other Ambulatory Visit: Payer: Self-pay | Admitting: Family Medicine

## 2016-12-22 ENCOUNTER — Ambulatory Visit (INDEPENDENT_AMBULATORY_CARE_PROVIDER_SITE_OTHER): Payer: PPO | Admitting: Family Medicine

## 2016-12-22 VITALS — BP 120/80 | HR 98 | Temp 98.4°F

## 2016-12-22 DIAGNOSIS — I10 Essential (primary) hypertension: Secondary | ICD-10-CM | POA: Diagnosis not present

## 2016-12-22 DIAGNOSIS — R7989 Other specified abnormal findings of blood chemistry: Secondary | ICD-10-CM

## 2016-12-22 DIAGNOSIS — E119 Type 2 diabetes mellitus without complications: Secondary | ICD-10-CM

## 2016-12-22 DIAGNOSIS — R5383 Other fatigue: Secondary | ICD-10-CM

## 2016-12-22 DIAGNOSIS — E785 Hyperlipidemia, unspecified: Secondary | ICD-10-CM

## 2016-12-22 DIAGNOSIS — G8250 Quadriplegia, unspecified: Secondary | ICD-10-CM

## 2016-12-22 LAB — TESTOSTERONE: Testosterone: 751.55 ng/dL (ref 300.00–890.00)

## 2016-12-22 LAB — TSH: TSH: 5.59 u[IU]/mL — AB (ref 0.35–4.50)

## 2016-12-22 LAB — VITAMIN B12: VITAMIN B 12: 534 pg/mL (ref 211–911)

## 2016-12-22 LAB — LIPID PANEL
CHOLESTEROL: 145 mg/dL (ref 0–200)
HDL: 39.6 mg/dL (ref >39.00)
LDL CALC: 72 mg/dL (ref 0–99)
NonHDL: 105.54
TRIGLYCERIDES: 169 mg/dL — AB (ref 0.0–149.0)
Total CHOL/HDL Ratio: 4
VLDL: 33.8 mg/dL (ref 0.0–40.0)

## 2016-12-22 LAB — HEMOGLOBIN A1C: Hgb A1c MFr Bld: 6.6 % — ABNORMAL HIGH (ref 4.6–6.5)

## 2016-12-22 NOTE — Telephone Encounter (Signed)
Faxed received and given to Dr [name]

## 2016-12-22 NOTE — Progress Notes (Signed)
Austin [name] was evaluated today for power mobility needs.  This patient has mobility limitations due to quadriplegia from a brainstem stroke that prevents accomplishing ADLs. This cannot be resolved by use of fitted cane or walker.  This patient does not have sufficient arm function to propel a wheelchair to perform mobility related ADLs.  This patient could benefit from use of a specifically configured power wheelchair and can safely transfer to and from a power wheelchair and operate the controls.

## 2016-12-22 NOTE — Progress Notes (Signed)
Subjective:     Patient ID: Austin [name], male   DOB: July 08, 1956, 61 y.o.   MRN: [id number]  HPI Patient seen for medical follow-up. 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 difficile colitis, low testosterone.Marland Kitchen He was treated with vancomycin per GI. He still has occasional loose stools but overall improved. His appetite is fair.  Biggest complaint is increased fatigue. Sleeping generally okay with trazodone. He does take testosterone every other week and wife has noted some increased dry skin. Medications reviewed. Compliant with all. Past history of diabetes. We held metformin last visit because blood sugars were well controlled. Blood sugars are remaining frequently below 100 fasting  Past Medical History:  Diagnosis Date  . Cerebrovascular accident ([place]) 1994   hx of brainstem stroke, residual diminished lung capacity  . Chronic kidney disease    Left Renal Mass  . Diabetes mellitus without complication ([place])   . Hx of colonic polyps   . Hypertension   . Hypogonadism   . 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 Neuromuscular disorder ([place])    quadraplegic  . Pituitary adenoma ([place])   . 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 Sleep apnea    stopbang=4  . Urinary incontinence   . Venous stasis    edema   Past Surgical History:  Procedure Laterality Date  . CHOLECYSTECTOMY    . COLONOSCOPY  02/27/2012   Procedure: COLONOSCOPY;  Surgeon: Jerene Bears, MD;  Location: [facility];  Service: Gastroenterology;  Laterality: N/A;  . gamma knife radiation surgery  05/2010   for pituitary  . PITUITARY SURGERY  2007, 2011  . ROBOT ASSISTED LAPAROSCOPIC NEPHRECTOMY  04/23/2012   Procedure:  ROBOTIC ASSISTED LAPAROSCOPIC NEPHRECTOMY;  Surgeon: Alexis Frock, MD;  Location: [facility];  Service: Urology;  Laterality: Left;  radical  . Robotic Partial Nephrectomy  04-23-12   left   Left Renal Mass  . stomach peg  02/1993   removed 5-6 years later  . TRACHEOSTOMY TUBE PLACEMENT  02/1993   removed 04/1993  . UMBILICAL HERNIA REPAIR  04/23/2012   Procedure: HERNIA REPAIR UMBILICAL ADULT;  Surgeon: Alexis Frock, MD;  Location: [facility];  Service: Urology;;  . [name]  . vocal cord surgery     injected with collagen, then fat from stomach to improve speech s/p stroke    reports that he quit smoking about 24 years ago. His smoking use included Cigarettes. He has a 11.50 pack-year smoking history. He has never used smokeless tobacco. He reports that he does not drink alcohol or use drugs. family history includes Alcohol abuse in his father; Arthritis in his maternal grandmother and mother; Colon cancer in his brother; Esophageal cancer (age of onset: 80) in his father; Hyperlipidemia in his mother; Hypertension in his brother; Stroke in his paternal grandmother; Throat cancer in his father; Uterine cancer (age of onset: 25) in his mother. Allergies  Allergen Reactions  . Penicillins Rash and Other (See Comments)    Has patient had a PCN reaction causing immediate rash, facial/tongue/throat swelling, SOB or lightheadedness with hypotension: No Has patient had a PCN reaction causing severe rash involving mucus membranes or skin necrosis: No Has patient had a PCN reaction that required hospitalization No Has patient had  a PCN reaction occurring within the last 10 years: No If all of the above answers are "NO", then may proceed with Cephalosporin use.     Review of Systems  Constitutional: Positive for fatigue. Negative for chills and fever.  HENT: Negative for trouble swallowing.   Respiratory: Negative for cough and shortness of breath.   Cardiovascular: Negative for chest pain,  palpitations and leg swelling.  Gastrointestinal: Negative for abdominal pain.  Genitourinary: Negative for dysuria.  Skin: Negative for rash.  Neurological: Positive for weakness.  Psychiatric/Behavioral: Negative for dysphoric mood.       Objective:   Physical Exam  Constitutional: He is oriented to person, place, and time. He appears well-developed and well-nourished.  HENT:  Right Ear: External ear normal.  Left Ear: External ear normal.  Mouth/Throat: Oropharynx is clear and moist.  Eyes: Pupils are equal, round, and reactive to light.  Neck: Neck supple. No thyromegaly present.  Cardiovascular: Normal rate and regular rhythm.   Pulmonary/Chest: Effort normal and breath sounds normal. No respiratory distress. He has no wheezes. He has no rales.  Musculoskeletal: He exhibits no edema.  Neurological: He is alert and oriented to person, place, and time.       Assessment:     #1 increasing fatigue. Difficult to sort out with his multiple chronic problems as above. He does have pituitary issues the past and we have screened thyroid the past which has been normal but needs to be rechecked especially with his recent increased dry skin  #2 metastatic renal cell carcinoma. He has decided against further chemotherapy and remains at peace with that decision  #3 history of type 2 diabetes  #4 hypertension stable and at goal  #5 dyslipidemia    Plan:     -Check multiple labs including TSH, B12, testosterone level, hemoglobin A1c, lipid panel. We did not check chemistries since these were assessed recently in April. His CBC was also stable at that time. -Continue to hold metformin -Consider early morning cortisol levels if fatigue persists -Follow-up with GI for any recurrent  [name] MD [facility]

## 2016-12-22 NOTE — Telephone Encounter (Signed)
Patient here for an office  Visit to day 12/22/16.  Patient's wife called and the paperwork should be faxed today.

## 2016-12-23 ENCOUNTER — Other Ambulatory Visit: Payer: Self-pay | Admitting: *Deleted

## 2016-12-23 MED ORDER — LEVOTHYROXINE SODIUM 25 MCG PO TABS: 25.0000 ug | ORAL_TABLET | Freq: Every day | ORAL | 0 refills | Status: DC

## 2016-12-30 ENCOUNTER — Telehealth: Payer: Self-pay | Admitting: Family Medicine

## 2016-12-30 NOTE — Telephone Encounter (Signed)
Austin [name] with [name] states they did not get all pt's paperwork back in order for him to get the wheelchair.   1. They received a "blank" 7 element order (all dates should have the 5/14 date)  2. No signed PT notes.  Must be dated May 14  They need these 2 things back before they are able to move forward with the wheelchair.  She said she spoke with you on another day, but did not get back what she needed.

## 2016-12-30 NOTE — Telephone Encounter (Signed)
Forms completed and faxed.  

## 2017-01-20 ENCOUNTER — Telehealth: Payer: Self-pay | Admitting: Family Medicine

## 2017-01-20 NOTE — Telephone Encounter (Signed)
° ° °  Wife call to say that she thinks husband is having a reaction to the thyroid medicine. Pt is having diarh,muscle cramp,

## 2017-01-20 NOTE — Telephone Encounter (Signed)
Have them hold Levothyroxine for one week to see if symptoms resolve.

## 2017-01-20 NOTE — Telephone Encounter (Signed)
Spoke with wife and she will call back next week with a report.

## 2017-01-22 ENCOUNTER — Other Ambulatory Visit: Payer: Self-pay

## 2017-01-22 ENCOUNTER — Ambulatory Visit (HOSPITAL_COMMUNITY): Payer: PPO | Attending: Internal Medicine

## 2017-01-22 ENCOUNTER — Telehealth: Payer: Self-pay | Admitting: Internal Medicine

## 2017-01-22 DIAGNOSIS — R1032 Left lower quadrant pain: Secondary | ICD-10-CM

## 2017-01-22 NOTE — Telephone Encounter (Signed)
Pts wife called and states that pt is c/o LLQ abd pain, pt states it started 4-5 days ago. Reports advil seems to help with the discomfort but she is afraid that he could have diverticulitis again. Please advise.

## 2017-01-22 NOTE — Telephone Encounter (Signed)
Would proceed to CT scan of the abdomen and pelvis with contrast - evaluate for diverticulitis

## 2017-01-23 ENCOUNTER — Other Ambulatory Visit: Payer: Self-pay

## 2017-01-23 ENCOUNTER — Telehealth: Payer: Self-pay

## 2017-01-23 ENCOUNTER — Inpatient Hospital Stay (HOSPITAL_COMMUNITY)
Admission: EM | Admit: 2017-01-23 | Discharge: 2017-01-30 | DRG: 391 | Disposition: A | Discharge status: Home / Self Care | Payer: PPO | Attending: Internal Medicine | Admitting: Internal Medicine

## 2017-01-23 ENCOUNTER — Encounter (HOSPITAL_COMMUNITY): Payer: Self-pay | Admitting: Emergency Medicine

## 2017-01-23 ENCOUNTER — Ambulatory Visit
Admission: RE | Admit: 2017-01-23 | Discharge: 2017-01-23 | Disposition: A | Discharge status: Home / Self Care | Payer: PPO | Source: Ambulatory Visit | Attending: Internal Medicine | Admitting: Internal Medicine

## 2017-01-23 ENCOUNTER — Other Ambulatory Visit (INDEPENDENT_AMBULATORY_CARE_PROVIDER_SITE_OTHER): Payer: PPO

## 2017-01-23 DIAGNOSIS — Z87891 Personal history of nicotine dependence: Secondary | ICD-10-CM | POA: Diagnosis not present

## 2017-01-23 DIAGNOSIS — Z85528 Personal history of other malignant neoplasm of kidney: Secondary | ICD-10-CM | POA: Diagnosis not present

## 2017-01-23 DIAGNOSIS — Z905 Acquired absence of kidney: Secondary | ICD-10-CM | POA: Diagnosis not present

## 2017-01-23 DIAGNOSIS — R1032 Left lower quadrant pain: Secondary | ICD-10-CM

## 2017-01-23 DIAGNOSIS — C7971 Secondary malignant neoplasm of right adrenal gland: Secondary | ICD-10-CM | POA: Diagnosis present

## 2017-01-23 DIAGNOSIS — K651 Peritoneal abscess: Secondary | ICD-10-CM

## 2017-01-23 DIAGNOSIS — E039 Hypothyroidism, unspecified: Secondary | ICD-10-CM | POA: Diagnosis present

## 2017-01-23 DIAGNOSIS — E119 Type 2 diabetes mellitus without complications: Secondary | ICD-10-CM

## 2017-01-23 DIAGNOSIS — E1122 Type 2 diabetes mellitus with diabetic chronic kidney disease: Secondary | ICD-10-CM | POA: Diagnosis present

## 2017-01-23 DIAGNOSIS — I129 Hypertensive chronic kidney disease with stage 1 through stage 4 chronic kidney disease, or unspecified chronic kidney disease: Secondary | ICD-10-CM | POA: Diagnosis not present

## 2017-01-23 DIAGNOSIS — E872 Acidosis: Secondary | ICD-10-CM | POA: Diagnosis not present

## 2017-01-23 DIAGNOSIS — N189 Chronic kidney disease, unspecified: Secondary | ICD-10-CM | POA: Diagnosis present

## 2017-01-23 DIAGNOSIS — C786 Secondary malignant neoplasm of retroperitoneum and peritoneum: Secondary | ICD-10-CM | POA: Diagnosis not present

## 2017-01-23 DIAGNOSIS — K429 Umbilical hernia without obstruction or gangrene: Secondary | ICD-10-CM | POA: Diagnosis present

## 2017-01-23 DIAGNOSIS — Z880 Allergy status to penicillin: Secondary | ICD-10-CM | POA: Diagnosis not present

## 2017-01-23 DIAGNOSIS — G8250 Quadriplegia, unspecified: Secondary | ICD-10-CM | POA: Diagnosis present

## 2017-01-23 DIAGNOSIS — I10 Essential (primary) hypertension: Secondary | ICD-10-CM

## 2017-01-23 DIAGNOSIS — K5780 Diverticulitis of intestine, part unspecified, with perforation and abscess without bleeding: Secondary | ICD-10-CM | POA: Diagnosis not present

## 2017-01-23 DIAGNOSIS — Z79899 Other long term (current) drug therapy: Secondary | ICD-10-CM | POA: Diagnosis not present

## 2017-01-23 DIAGNOSIS — C7972 Secondary malignant neoplasm of left adrenal gland: Secondary | ICD-10-CM | POA: Diagnosis present

## 2017-01-23 DIAGNOSIS — G4730 Sleep apnea, unspecified: Secondary | ICD-10-CM | POA: Diagnosis not present

## 2017-01-23 DIAGNOSIS — N179 Acute kidney failure, unspecified: Secondary | ICD-10-CM | POA: Diagnosis present

## 2017-01-23 DIAGNOSIS — A0471 Enterocolitis due to Clostridium difficile, recurrent: Secondary | ICD-10-CM | POA: Diagnosis present

## 2017-01-23 DIAGNOSIS — I69365 Other paralytic syndrome following cerebral infarction, bilateral: Secondary | ICD-10-CM

## 2017-01-23 DIAGNOSIS — E785 Hyperlipidemia, unspecified: Secondary | ICD-10-CM

## 2017-01-23 DIAGNOSIS — L89311 Pressure ulcer of right buttock, stage 1: Secondary | ICD-10-CM | POA: Diagnosis not present

## 2017-01-23 DIAGNOSIS — K5792 Diverticulitis of intestine, part unspecified, without perforation or abscess without bleeding: Secondary | ICD-10-CM | POA: Diagnosis not present

## 2017-01-23 DIAGNOSIS — Z8673 Personal history of transient ischemic attack (TIA), and cerebral infarction without residual deficits: Secondary | ICD-10-CM | POA: Diagnosis not present

## 2017-01-23 DIAGNOSIS — Z7952 Long term (current) use of systemic steroids: Secondary | ICD-10-CM

## 2017-01-23 DIAGNOSIS — K5720 Diverticulitis of large intestine with perforation and abscess without bleeding: Secondary | ICD-10-CM | POA: Diagnosis not present

## 2017-01-23 DIAGNOSIS — L89321 Pressure ulcer of left buttock, stage 1: Secondary | ICD-10-CM | POA: Diagnosis not present

## 2017-01-23 DIAGNOSIS — C649 Malignant neoplasm of unspecified kidney, except renal pelvis: Secondary | ICD-10-CM | POA: Diagnosis present

## 2017-01-23 DIAGNOSIS — I639 Cerebral infarction, unspecified: Secondary | ICD-10-CM

## 2017-01-23 DIAGNOSIS — K630 Abscess of intestine: Secondary | ICD-10-CM | POA: Diagnosis not present

## 2017-01-23 DIAGNOSIS — K5732 Diverticulitis of large intestine without perforation or abscess without bleeding: Secondary | ICD-10-CM | POA: Diagnosis not present

## 2017-01-23 HISTORY — DX: Peritoneal abscess: K65.1

## 2017-01-23 LAB — CBC
HEMATOCRIT: 37 % — AB (ref 39.0–52.0)
HEMOGLOBIN: 12 g/dL — AB (ref 13.0–17.0)
MCH: 27.8 pg (ref 26.0–34.0)
MCHC: 32.4 g/dL (ref 30.0–36.0)
MCV: 85.6 fL (ref 78.0–100.0)
Platelets: 235 10*3/uL (ref 150–400)
RBC: 4.32 MIL/uL (ref 4.22–5.81)
RDW: 17.2 % — ABNORMAL HIGH (ref 11.5–15.5)
WBC: 12 10*3/uL — AB (ref 4.0–10.5)

## 2017-01-23 LAB — LIPASE, BLOOD: LIPASE: 43 U/L (ref 11–51)

## 2017-01-23 LAB — COMPREHENSIVE METABOLIC PANEL
ALK PHOS: 70 U/L (ref 38–126)
ALT: 26 U/L (ref 17–63)
AST: 26 U/L (ref 15–41)
Albumin: 3.1 g/dL — ABNORMAL LOW (ref 3.5–5.0)
Anion gap: 9 (ref 5–15)
BUN: 32 mg/dL — AB (ref 6–20)
CALCIUM: 8.8 mg/dL — AB (ref 8.9–10.3)
CO2: 22 mmol/L (ref 22–32)
CREATININE: 1.72 mg/dL — AB (ref 0.61–1.24)
Chloride: 105 mmol/L (ref 101–111)
GFR calc non Af Amer: 41 mL/min — ABNORMAL LOW (ref >60)
GFR, EST AFRICAN AMERICAN: 47 mL/min — AB (ref >60)
Glucose, Bld: 97 mg/dL (ref 65–99)
Potassium: 5 mmol/L (ref 3.5–5.1)
Sodium: 136 mmol/L (ref 135–145)
Total Bilirubin: 0.4 mg/dL (ref 0.3–1.2)
Total Protein: 8 g/dL (ref 6.5–8.1)

## 2017-01-23 LAB — BASIC METABOLIC PANEL
BUN: 35 mg/dL — ABNORMAL HIGH (ref 6–23)
CALCIUM: 9.2 mg/dL (ref 8.4–10.5)
CO2: 21 mEq/L (ref 19–32)
Chloride: 107 mEq/L (ref 96–112)
Creatinine, Ser: 1.73 mg/dL — ABNORMAL HIGH (ref 0.40–1.50)
GFR: 42.71 mL/min — AB (ref >60.00)
Glucose, Bld: 152 mg/dL — ABNORMAL HIGH (ref 70–99)
POTASSIUM: 4.6 meq/L (ref 3.5–5.1)
Sodium: 137 mEq/L (ref 135–145)

## 2017-01-23 LAB — I-STAT CG4 LACTIC ACID, ED: Lactic Acid, Venous: 0.87 mmol/L (ref 0.5–1.9)

## 2017-01-23 MED ORDER — ONDANSETRON HCL 4 MG PO TABS: 4.0000 mg | ORAL_TABLET | Freq: Four times a day (QID) | ORAL | Status: DC | PRN

## 2017-01-23 MED ORDER — ONDANSETRON HCL 4 MG/2ML IJ SOLN: 4.0000 mg | Freq: Four times a day (QID) | INTRAMUSCULAR | Status: DC | PRN

## 2017-01-23 MED ORDER — IOPAMIDOL (ISOVUE-300) INJECTION 61%
70.0000 mL | Freq: Once | INTRAVENOUS | Status: AC | PRN
  Administered 2017-01-23: 70 mL via INTRAVENOUS

## 2017-01-23 MED ORDER — GENTAMICIN SULFATE 40 MG/ML IJ SOLN
540.0000 mg | INTRAVENOUS | Status: DC
  Administered 2017-01-23: 540 mg via INTRAVENOUS
  Filled 2017-01-23: qty 13.5

## 2017-01-23 MED ORDER — SODIUM CHLORIDE 0.9 % IV BOLUS (SEPSIS)
1000.0000 mL | Freq: Once | INTRAVENOUS | Status: AC
  Administered 2017-01-23: 1000 mL via INTRAVENOUS

## 2017-01-23 MED ORDER — CIPROFLOXACIN IN D5W 400 MG/200ML IV SOLN
400.0000 mg | Freq: Two times a day (BID) | INTRAVENOUS | Status: DC
  Administered 2017-01-24 – 2017-01-25 (×3): 400 mg via INTRAVENOUS
  Filled 2017-01-23 (×3): qty 200

## 2017-01-23 MED ORDER — SODIUM CHLORIDE 0.9 % IV SOLN
Freq: Once | INTRAVENOUS | Status: AC
  Administered 2017-01-23: 14:00:00 via INTRAVENOUS

## 2017-01-23 MED ORDER — OXYCODONE HCL 5 MG PO TABS: 5.0000 mg | ORAL_TABLET | ORAL | Status: DC | PRN

## 2017-01-23 MED ORDER — LEVOTHYROXINE SODIUM 25 MCG PO TABS
25.0000 ug | ORAL_TABLET | Freq: Every day | ORAL | Status: DC
  Filled 2017-01-23: qty 1

## 2017-01-23 MED ORDER — METRONIDAZOLE IN NACL 5-0.79 MG/ML-% IV SOLN
500.0000 mg | Freq: Three times a day (TID) | INTRAVENOUS | Status: DC
  Administered 2017-01-23 – 2017-01-25 (×5): 500 mg via INTRAVENOUS
  Filled 2017-01-23 (×7): qty 100

## 2017-01-23 MED ORDER — CIPROFLOXACIN IN D5W 400 MG/200ML IV SOLN
400.0000 mg | Freq: Once | INTRAVENOUS | Status: AC
  Administered 2017-01-23: 400 mg via INTRAVENOUS
  Filled 2017-01-23: qty 200

## 2017-01-23 MED ORDER — MORPHINE SULFATE (PF) 2 MG/ML IV SOLN: 1.0000 mg | INTRAVENOUS | Status: DC | PRN

## 2017-01-23 MED ORDER — TRAZODONE HCL 100 MG PO TABS
100.0000 mg | ORAL_TABLET | Freq: Every day | ORAL | Status: DC
  Administered 2017-01-23 – 2017-01-29 (×7): 100 mg via ORAL
  Filled 2017-01-23 (×8): qty 1

## 2017-01-23 MED ORDER — ESCITALOPRAM OXALATE 10 MG PO TABS
10.0000 mg | ORAL_TABLET | Freq: Every day | ORAL | Status: DC
  Administered 2017-01-23 – 2017-01-30 (×8): 10 mg via ORAL
  Filled 2017-01-23 (×8): qty 1

## 2017-01-23 MED ORDER — METRONIDAZOLE IN NACL 5-0.79 MG/ML-% IV SOLN
500.0000 mg | Freq: Once | INTRAVENOUS | Status: AC
  Administered 2017-01-23: 500 mg via INTRAVENOUS
  Filled 2017-01-23: qty 100

## 2017-01-23 MED ORDER — ACETAMINOPHEN 650 MG RE SUPP: 650.0000 mg | Freq: Four times a day (QID) | RECTAL | Status: DC | PRN

## 2017-01-23 MED ORDER — CLOPIDOGREL BISULFATE 75 MG PO TABS
75.0000 mg | ORAL_TABLET | Freq: Every day | ORAL | Status: DC
  Administered 2017-01-23: 75 mg via ORAL
  Filled 2017-01-23: qty 1

## 2017-01-23 MED ORDER — ACETAMINOPHEN 325 MG PO TABS: 650.0000 mg | ORAL_TABLET | Freq: Four times a day (QID) | ORAL | Status: DC | PRN

## 2017-01-23 MED ORDER — SODIUM CHLORIDE 0.9 % IV SOLN
INTRAVENOUS | Status: DC
  Administered 2017-01-23 – 2017-01-26 (×5): via INTRAVENOUS

## 2017-01-23 NOTE — ED Triage Notes (Addendum)
Pt went to PCP today for LLQ abdominal pain x 4 days, sent to ED after CT scan today revealed abdominal abscess, along with worsening of abdominal mets. Recently hospitalized with diverticulitis, abdominal abscess, and iatrogenic C. difficile.

## 2017-01-23 NOTE — Telephone Encounter (Signed)
Austin [name] pt that was c/o LLQ abd pain yesterday. Pt was scheduled for CT of A/P today, ? Diverticulitis. Please see Impression below as doc of the day, please advise.  IMPRESSION: 1. Pericolonic left lower quadrant 3.9 x 2.7 cm gas containing abscess adjacent to the sigmoid colon, increased in size since 10/08/2016 CT study. 2. Progression of pulmonary metastases at the lung bases . 3. Progression of scattered peritoneal metastases. No evidence of bowel obstruction. 4. Stable splenic and bilateral adrenal metastases. These results will be called to the ordering clinician or representative by the Radiology Department at the imaging location.

## 2017-01-23 NOTE — Progress Notes (Signed)
Pharmacy Antibiotic Follow-up Note  Austin [name] is a 61 y.o. year-old male admitted on 01/23/2017.  The patient is currently on day 1 of Gentamicin for intra-abdominal infection, diverticulitis.  Assessment/Plan: Gentamicin 540mg  IV q24hr 10 hr level - assess level, adjust per nomogram  Temp (24hrs), Avg:98 F (36.7 C), Min:97.6 F (36.4 C), Max:98.4 F (36.9 C)   Recent Labs Lab 01/23/17 1345  WBC 12.0*    Recent Labs Lab 01/23/17 0918  CREATININE 1.73*   Estimated Creatinine Clearance: 48.6 mL/min (A) (by C-G formula based on SCr of 1.73 mg/dL (H)).    Allergies  Allergen Reactions  . Penicillins Rash and Other (See Comments)    Has patient had a PCN reaction causing immediate rash, facial/tongue/throat swelling, SOB or lightheadedness with hypotension: No Has patient had a PCN reaction causing severe rash involving mucus membranes or skin necrosis: No Has patient had a PCN reaction that required hospitalization No Has patient had a PCN reaction occurring within the last 10 years: No If all of the above answers are "NO", then may proceed with Cephalosporin use.    Antimicrobials this admission: 6/15 Cipro/Flagyl x 1 in ED 6/15 Gentamicin >>  Levels/dose changes this admission:   Microbiology results: 6/15 BCx: sent  Thank you for allowing pharmacy to be a part of this patient's care.   Minda Ditto PharmD Pager [phone] 01/23/2017, 2:48 PM

## 2017-01-23 NOTE — Progress Notes (Signed)
Pharmacy Antibiotic Note  Austin [name] is a 61 y.o. male admitted on 01/23/2017 with intra-abdominal infection. Past medical history of quadriplegia due to brainstem stroke, previous episode of diverticulitis with abscess presents with complaints of abdominal pain. Pharmacy has been consulted for cipro dosing.  Plan: cipro 400mg  IV q12h for CrCl  >58mls/min Follow renal function and clinical course  Weight: 173 lb (78.5 kg)  Temp (24hrs), Avg:98 F (36.7 C), Min:97.6 F (36.4 C), Max:98.4 F (36.9 C)   Recent Labs Lab 01/23/17 0918 01/23/17 1345 01/23/17 1351  WBC  --  12.0*  --   CREATININE 1.73* 1.72*  --   LATICACIDVEN  --   --  0.87    Estimated Creatinine Clearance: 48.9 mL/min (A) (by C-G formula based on SCr of 1.72 mg/dL (H)).    Allergies  Allergen Reactions  . Penicillins Rash and Other (See Comments)    Has patient had a PCN reaction causing immediate rash, facial/tongue/throat swelling, SOB or lightheadedness with hypotension: No Has patient had a PCN reaction causing severe rash involving mucus membranes or skin necrosis: No Has patient had a PCN reaction that required hospitalization No Has patient had a PCN reaction occurring within the last 10 years: No If all of the above answers are "NO", then may proceed with Cephalosporin use.     Thank you for allowing pharmacy to be a part of this patient's care.  Dolly Rias RPh 01/23/2017, 10:07 PM Pager [phone]

## 2017-01-23 NOTE — Consult Note (Signed)
[facility] Surgery Consult/Admission Note  Austin [name] 05/15/1956  [id number].    Requesting MD: Dr. 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 Patient's last hospital course was complicated by C. Difficile. Wife was at bedside. Patient states pain has gotten worse over last 4 days. Pain feels similar to his last episode of diverticulitis. Pain is in the LLQ, comes and goes, waxes and wanes in severity, associated subjective fever and chills. Patient denies nausea, vomiting, constipation, diarrhea.   ED course: BUN 32, creatinine 1.72, GFR 41, WBC 12, Hg 12 CT abdomen pelvis: Pericolonic LLQ gas-containing abscess adjacent to the sigmoid colon increase in size since 10/08/16. Abscess measures roughly 3.9 x 2.7 cm. CT also demonstrates progression of pulmonary and peritoneal metastases with stable splenic bilateral adrenal metastases.  ROS:  Review of Systems  Constitutional: Positive for chills and fever.  HENT: Negative for sore throat.   Respiratory: Negative for shortness of breath.   Cardiovascular: Negative for chest pain.  Gastrointestinal: Positive for abdominal pain. Negative for constipation, diarrhea, nausea and vomiting.  Skin: Negative for rash.  Neurological: Negative for loss of consciousness.  All other systems reviewed and are negative.    Family History  Problem Relation Age of Onset  . Alcohol abuse Father   . Throat cancer Father   . Esophageal cancer Father 62  . Arthritis Mother   . Hyperlipidemia Mother   . Uterine  cancer Mother 3  . Colon cancer Brother   . Arthritis Maternal Grandmother   . Hypertension Brother   . Stroke Paternal Grandmother     Past Medical History:  Diagnosis Date  . Cerebrovascular accident ([place]) 1994   hx of brainstem stroke, residual diminished lung capacity  . Chronic kidney disease    Left Renal Mass  . Diabetes mellitus without complication ([place])   . Hx of colonic polyps   . Hypertension   . Hypogonadism   . 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 Neuromuscular disorder ([place])    quadraplegic  . Pituitary adenoma ([place])   . 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 Sleep apnea    stopbang=4  . Urinary incontinence   . Venous stasis    edema    Past Surgical History:  Procedure Laterality Date  . CHOLECYSTECTOMY    . COLONOSCOPY  02/27/2012   Procedure: COLONOSCOPY;  Surgeon: Jerene Bears, MD;  [facility];  Service: Gastroenterology;  Laterality: N/A;  . gamma knife radiation surgery  05/2010   for pituitary  . PITUITARY SURGERY  2007, 2011  . ROBOT ASSISTED LAPAROSCOPIC NEPHRECTOMY  04/23/2012   Procedure: ROBOTIC ASSISTED LAPAROSCOPIC NEPHRECTOMY;  Surgeon: Alexis Frock, MD;  Location[facility];  Service: Urology;  Laterality: Left;  radical  . Robotic Partial Nephrectomy  04-23-12   left   Left Renal Mass  . stomach peg  02/1993   removed 5-6 years later  . TRACHEOSTOMY TUBE PLACEMENT  02/1993   removed 04/1993  . UMBILICAL HERNIA REPAIR  04/23/2012   Procedure:  HERNIA REPAIR UMBILICAL ADULT;  Surgeon: Alexis Frock, MD;  Location: [facility];  Service: Urology;;  . [name]  . vocal cord surgery     injected with collagen, then fat from stomach to improve speech s/p stroke    Social History:  reports that he quit smoking about 24 years ago. His smoking use included Cigarettes. He has a 11.50 pack-year smoking history. He has never used smokeless tobacco. He  reports that he does not drink alcohol or use drugs.  Allergies:  Allergies  Allergen Reactions  . Penicillins Rash and Other (See Comments)    Has patient had a PCN reaction causing immediate rash, facial/tongue/throat swelling, SOB or lightheadedness with hypotension: No Has patient had a PCN reaction causing severe rash involving mucus membranes or skin necrosis: No Has patient had a PCN reaction that required hospitalization No Has patient had a PCN reaction occurring within the last 10 years: No If all of the above answers are "NO", then may proceed with Cephalosporin use.     (Not in a hospital admission)  Blood pressure 126/87, pulse 69, temperature 97.6 F (36.4 C), temperature source Oral, resp. rate 18, weight 173 lb (78.5 kg), SpO2 98 %.  Physical Exam  Constitutional: He is oriented to person, place, and time. No distress.  Paraplegic, pleasant white male in no acute distress, patient has difficulty speaking  HENT:  Head: Normocephalic and atraumatic.  Nose: Nose normal.  Mouth/Throat: Oropharynx is clear and moist. No oropharyngeal exudate.  Eyes: Conjunctivae are normal. Pupils are equal, round, and reactive to light. Right eye exhibits no discharge. Left eye exhibits no discharge. No scleral icterus.  Neck: No tracheal deviation present.  Cardiovascular: Normal rate, regular rhythm and normal heart sounds.   No murmur heard. Pulses:      Radial pulses are 2+ on the right side, and 2+ on the left side.       Dorsalis pedis pulses are 2+ on the right side, and 2+ on the left side.  Pulmonary/Chest: Effort normal and breath sounds normal. He has no wheezes. He has no rales.  Abdominal: Soft. Normal appearance. He exhibits no distension. Bowel sounds are hyperactive. There is tenderness in the right lower quadrant. There is guarding. No hernia.  Musculoskeletal: He exhibits no edema or tenderness.  Neurological: He is alert and oriented to person, place, and time.   Skin: Skin is warm and dry. No rash noted. He is not diaphoretic.  Psychiatric: Mood and affect normal.  Nursing note and vitals reviewed.   Results for orders placed or performed during the hospital encounter of 01/23/17 (from the past 48 hour(s))  CBC     Status: Abnormal   Collection Time: 01/23/17  1:45 PM  Result Value Ref Range   WBC 12.0 (H) 4.0 - 10.5 K/uL   RBC 4.32 4.22 - 5.81 MIL/uL   Hemoglobin 12.0 (L) 13.0 - 17.0 g/dL   HCT 37.0 (L) 39.0 - 52.0 %   MCV 85.6 78.0 - 100.0 fL   MCH 27.8 26.0 - 34.0 pg   MCHC 32.4 30.0 - 36.0 g/dL   RDW 17.2 (H) 11.5 - 15.5 %   Platelets 235 150 - 400 K/uL  Comprehensive metabolic panel     Status: Abnormal   Collection Time: 01/23/17  1:45 PM  Result Value Ref Range   Sodium 136 135 - 145 mmol/L   Potassium 5.0 3.5 - 5.1 mmol/L   Chloride 105 101 - 111 mmol/L  CO2 22 22 - 32 mmol/L   Glucose, Bld 97 65 - 99 mg/dL   BUN 32 (H) 6 - 20 mg/dL   Creatinine, Ser 1.72 (H) 0.61 - 1.24 mg/dL   Calcium 8.8 (L) 8.9 - 10.3 mg/dL   Total Protein 8.0 6.5 - 8.1 g/dL   Albumin 3.1 (L) 3.5 - 5.0 g/dL   AST 26 15 - 41 U/L   ALT 26 17 - 63 U/L   Alkaline Phosphatase 70 38 - 126 U/L   Total Bilirubin 0.4 0.3 - 1.2 mg/dL   GFR calc non Af Amer 41 (L) >60 mL/min   GFR calc Af Amer 47 (L) >60 mL/min    Comment: (NOTE) The eGFR has been calculated using the CKD EPI equation. This calculation has not been validated in all clinical situations. eGFR's persistently <60 mL/min signify possible Chronic Kidney Disease.    Anion gap 9 5 - 15  Lipase, blood     Status: None   Collection Time: 01/23/17  1:45 PM  Result Value Ref Range   Lipase 43 11 - 51 U/L  I-Stat CG4 Lactic Acid, ED     Status: None   Collection Time: 01/23/17  1:51 PM  Result Value Ref Range   Lactic Acid, Venous 0.87 0.5 - 1.9 mmol/L   Ct Abdomen Pelvis W Contrast  Result Date: 01/23/2017 CLINICAL DATA:  Left lower quadrant abdominal pain for 1 week. Recent perforated sigmoid  diverticulitis requiring percutaneous CT-guided drainage in February 2018. History of metastatic renal cell carcinoma. EXAM: CT ABDOMEN AND PELVIS WITH CONTRAST TECHNIQUE: Multidetector CT imaging of the abdomen and pelvis was performed using the standard protocol following bolus administration of intravenous contrast. CONTRAST:  83m ISOVUE-300 IOPAMIDOL (ISOVUE-300) INJECTION 61% COMPARISON:  10/08/2016 CT abdomen/ pelvis. FINDINGS: Lower chest: Enlarging 1.1 cm basilar left lower lobe solid pulmonary nodule (series 4/ image 11), previously 0.8 cm. New 0.5 cm left lower lobe solid pulmonary nodule (series 4/ image 6). Enlarging 0.7 cm anterior right lower lobe solid pulmonary nodule (series 4/ image 3), previously 0.4 cm. Hepatobiliary: Normal liver with no liver mass. Cholecystectomy. No biliary ductal dilatation. Pancreas: Normal, with no mass or duct dilation. Spleen: Solitary hypodense 1.5 x 3.9 cm splenic mass (series 3/ image 21), previously 4.6 x 4.3 cm, not appreciably changed. Overall normal size spleen. Adrenals/Urinary Tract: Left adrenal 1.8 x 1.5 cm mass (series 3/ image 31), previously 1.8 x 1.5 cm, stable. Hyperdense 0.8 cm right adrenal nodule (series 3/ image 25), stable. Status post left nephrectomy. Stable fat necrosis in the left nephrectomy bed. No additional mass or fluid collection in the left nephrectomy bed. No right hydronephrosis. No right renal mass. Normal bladder. Stomach/Bowel: Grossly normal stomach. Normal caliber small bowel with no small bowel wall thickening. Normal appendix. Mild sigmoid diverticulosis. Pericolonic gas containing 3.9 x 2.7 cm abscess adjacent to the sigmoid colon (series 3/ image 73), increased from 2.8 x 1.8 cm on 10/08/2016. Mild adjacent sigmoid colon wall thickening and pericolonic fat stranding. No large bowel dilatation. Vascular/Lymphatic: Atherosclerotic nonaneurysmal abdominal aorta. Patent portal, splenic, hepatic and right renal veins. No  pathologically enlarged lymph nodes in the abdomen or pelvis. Reproductive: Top-normal size prostate with nonspecific internal prostatic calcifications. Other: No additional abdominopelvic fluid collections. Stable mild to moderate gynecomastia, asymmetric to the right. Hyperenhancing 1.4 cm peritoneal implant inferior to the right liver lobe (series 3/image 30), increased from 0.6 cm. Irregular hyperenhancing 6.6 x 4.9 cm right paracolic gutter peritoneal implant (series 3/image  56), increased from 5.7 x 4.7 cm. Musculoskeletal: No aggressive appearing focal osseous lesions. Moderate thoracolumbar spondylosis. IMPRESSION: 1. Pericolonic left lower quadrant 3.9 x 2.7 cm gas containing abscess adjacent to the sigmoid colon, increased in size since 10/08/2016 CT study. 2. Progression of pulmonary metastases at the lung bases . 3. Progression of scattered peritoneal metastases. No evidence of bowel obstruction. 4. Stable splenic and bilateral adrenal metastases. These results will be called to the ordering clinician or representative by the Radiology Department at the imaging location. Electronically Signed   By: Ilona Sorrel M.D.   On: 01/23/2017 11:09      Assessment/Plan  Diverticulitis flareup February 2018 Paraplegic History of stroke Metastatic stage 4 RCC  Sigmoid Diverticulitis with abscess - admit to medicine - IV abx - pt may want surgery since it has only been a few months since last episode of diverticulitis - Will discuss with Dr. Hassell Done  Thank you for the consult.   Kalman Drape, [facility] Surgery 01/23/2017, 4:34 PM Pager: [phone] Consults: [phone] Mon-Fri 7:00 am-4:30 pm Sat-Sun 7:00 am-11:30 am

## 2017-01-23 NOTE — ED Notes (Signed)
PA at bedside to assess pt bottom; sacral redness noted; wife verbalizes had breakdown but has gotten better; barrier cream and foam pad applied.

## 2017-01-23 NOTE — Telephone Encounter (Signed)
Spoke with pts wife and she is aware and is taking him to the ER. Tye Savoy NP notified that pt will be coming to the ER.

## 2017-01-23 NOTE — ED Provider Notes (Signed)
[facility] DEPT Provider Note   CSN: [id number] Arrival date & time: 01/23/17  1235     History   Chief Complaint Chief Complaint  Patient presents with  . Abscess    abdomen    HPI Austin [name] is a 61 y.o. male with a h/o of diverticulitis, iatrogenic C. 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 He was sent to the ED by his PCP after having an outpatient CT A/P performed earlier today. His wife reports intermittent tactile fever and chills over the last 4 days. No nausea, vomiting, constipation, hematochezia, or melena. She reports that he finally stopped having diarrhea in late April after he was treated for C. Diff in February. She reports intermittent episodes of diarrhea over the last couple of weeks that began after the patient was started on Synthroid, which resolved after the medication was discontinued 5 days ago due to side effects. She denies diarrhea since.   PCP: Dr. [name]   The history is provided by the patient and the spouse. No language interpreter was used.    Past Medical History:  Diagnosis Date  . Cerebrovascular accident ([place]) 1994   hx of brainstem stroke, residual diminished lung capacity  . Chronic kidney disease    Left Renal Mass  . Diabetes mellitus without complication ([place])   . Hx of colonic polyps   . Hypertension   . Hypogonadism   . 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 Neuromuscular disorder ([place])    quadraplegic  . Pituitary adenoma ([place])   . 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 Sleep apnea    stopbang=4  . Urinary incontinence   . Venous stasis    edema    Patient Active Problem List   Diagnosis Date Noted  . Intra-abdominal abscess ([place]) 01/23/2017  .  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 Perforation of sigmoid colon due to diverticulitis 09/27/2016  . Fatigue 05/23/2015  . Type 2 diabetes mellitus, controlled ([place]) 06/01/2014  . Hyperlipidemia 10/17/2013  . Renal carcinoma metastatic with carcinomatosis 11/09/2012  . Type 2 diabetes mellitus ([place]) 03/31/2012  . Left renal mass 03/11/2012  . Anemia associated with acute blood loss 02/28/2012  . Abdominal pain, other specified site 02/27/2012  . Rectal bleeding 02/27/2012  . Post-polypectomy bleeding 02/27/2012  . Colon cancer screening 02/24/2012  . Low testosterone 06/21/2010  . WEAKNESS 04/03/2010  . FACIAL WEAKNESS 04/03/2010  . EDEMA 01/15/2009  . Pituitary tumor 01/15/2009  . Essential hypertension 10/16/2008  . Neurogenic bladder 10/16/2008  . OTHER SEBORRHEIC DERMATITIS 10/16/2008  . Brainstem stroke ([place]) 10/16/2008  . COLONIC POLYPS, HX OF 10/16/2008    Past Surgical History:  Procedure Laterality Date  . CHOLECYSTECTOMY    . COLONOSCOPY  02/27/2012   Procedure: COLONOSCOPY;  Surgeon: Jerene Bears, MD;  Location: [facility];  Service: Gastroenterology;  Laterality: N/A;  . gamma knife radiation surgery  05/2010   for pituitary  . PITUITARY SURGERY  2007, 2011  . ROBOT ASSISTED LAPAROSCOPIC NEPHRECTOMY  04/23/2012   Procedure: ROBOTIC ASSISTED LAPAROSCOPIC NEPHRECTOMY;  Surgeon: Alexis Frock, MD;  Location: [facility];  Service: Urology;  Laterality: Left;  radical  . Robotic Partial Nephrectomy  04-23-12   left   Left Renal  Mass  . stomach peg  02/1993   removed 5-6 years later  . TRACHEOSTOMY TUBE PLACEMENT  02/1993   removed 04/1993  . UMBILICAL HERNIA REPAIR  04/23/2012   Procedure: HERNIA REPAIR UMBILICAL ADULT;  Surgeon: Alexis Frock, MD;  Location: [facility];  Service: Urology;;  . [name]  . vocal cord surgery     injected with collagen, then fat from stomach to improve speech s/p stroke       Home Medications    Prior to Admission  medications   Medication Sig Start Date End Date Taking? Authorizing Provider  albuterol (PROVENTIL HFA;VENTOLIN HFA) 108 (90 BASE) MCG/ACT inhaler Inhale 2 puffs into the lungs every 6 (six) hours as needed for wheezing or shortness of breath. 07/07/15  Yes Debbrah Alar, NP  atorvastatin (LIPITOR) 20 MG tablet Take 20 mg by mouth daily.   Yes [provider]  clopidogrel (PLAVIX) 75 MG tablet Take 75 mg by mouth at bedtime.   Yes [provider]  escitalopram (LEXAPRO) 10 MG tablet Take 10 mg by mouth daily.   Yes [provider]  ketoconazole (NIZORAL) 2 % cream APPLY AS NEEDED TO RASH Patient taking differently: APPLY AS NEEDED TO RASH daily 10/23/16  Yes [name], [name], MD  levothyroxine (SYNTHROID, LEVOTHROID) 25 MCG tablet Take 1 tablet (25 mcg total) by mouth daily before breakfast. 12/23/16  Yes [name], [name], MD  lisinopril-hydrochlorothiazide (PRINZIDE,ZESTORETIC) 20-12.5 MG tablet Take 1 tablet by mouth daily.   Yes [provider]  Multiple Vitamin (MULTIVITAMIN WITH MINERALS) TABS tablet Take 1 tablet by mouth daily.   Yes [provider]  testosterone enanthate (DELATESTRYL) 200 MG/ML injection Inject 200 mg into the muscle every 14 (fourteen) days. For IM use only   Yes [provider]  traMADol (ULTRAM) 50 MG tablet Take 50 mg by mouth every 6 (six) hours as needed for moderate pain.   Yes [provider]  traZODone (DESYREL) 100 MG tablet Take 100 mg by mouth at bedtime as needed for sleep.   Yes [provider]  triamcinolone cream (KENALOG) 0.1 % Apply 1 application topically 2 (two) times daily as needed (for rash).   Yes [provider]  trimethoprim (TRIMPEX) 100 MG tablet Restart this after you have completed the Bactrim and Flagyl course. 10/09/16  Yes [name], PA-C    Family History Family History  Problem Relation Age of Onset  . Alcohol abuse Father   . Throat cancer  Father   . Esophageal cancer Father 80  . Arthritis Mother   . Hyperlipidemia Mother   . Uterine cancer Mother 32  . Colon cancer Brother   . Arthritis Maternal Grandmother   . Hypertension Brother   . Stroke Paternal Grandmother     Social History Social History  Substance Use Topics  . Smoking status: Former Smoker    Packs/day: 0.50    Years: 23.00    Types: Cigarettes    Quit date: 09/24/1992  . Smokeless tobacco: Never Used  . Alcohol use No     Comment: rarley     Allergies   Penicillins   Review of Systems Review of Systems  Constitutional: Positive for chills and fever (tactile). Negative for activity change.  Respiratory: Negative for shortness of breath.   Cardiovascular: Negative for chest pain.  Gastrointestinal: Positive for abdominal pain and diarrhea (resolved). Negative for blood in stool and constipation.  Genitourinary: Negative for dysuria.  Musculoskeletal: Negative for back pain.  Skin: Negative for rash.  Allergic/Immunologic: Positive for immunocompromised state.   Physical Exam Updated Vital Signs BP (!) 161/90 (BP Location: Right Leg)   Pulse 88   Temp 98.3 F (36.8 C) (Oral)   Resp 18   Wt 78.5 kg (173 lb)   SpO2 97%   BMI 23.46 kg/m   Physical Exam  Constitutional: He appears well-developed. No distress.  HENT:  Head: Normocephalic.  Eyes: Conjunctivae are normal.  Neck: Neck supple.  Cardiovascular: Normal rate, regular rhythm, normal heart sounds and intact distal pulses.   No murmur heard. Pulmonary/Chest: Effort normal and breath sounds normal. No respiratory distress. He has no wheezes. He has no rales.  Abdominal: Soft. He exhibits no distension. Bowel sounds are increased. There is tenderness in the left lower quadrant. There is no rebound and no guarding.  Peri-umbilical hernia present; non-tender.    Genitourinary:  Genitourinary Comments: Stage I decubitus ulcer near the intergluteal cleft with redness extending over  the superior portion of the bilateral buttocks. No obvious ulcerations or drainage.  Musculoskeletal: He exhibits no edema.  Neurological: He is alert.  Skin: Skin is warm and dry. Capillary refill takes less than 2 seconds. He is not diaphoretic.  Psychiatric: His behavior is normal.  Nursing note and vitals reviewed.    ED Treatments / Results  Labs (all labs ordered are listed, but only abnormal results are displayed) Labs Reviewed  CBC - Abnormal; Notable for the following:       Result Value   WBC 12.0 (*)    Hemoglobin 12.0 (*)    HCT 37.0 (*)    RDW 17.2 (*)    All other components within normal limits  COMPREHENSIVE METABOLIC PANEL - Abnormal; Notable for the following:    BUN 32 (*)    Creatinine, Ser 1.72 (*)    Calcium 8.8 (*)    Albumin 3.1 (*)    GFR calc non Af Amer 41 (*)    GFR calc Af Amer 47 (*)    All other components within normal limits  CULTURE, BLOOD (ROUTINE X 2)  CULTURE, BLOOD (ROUTINE X 2)  LIPASE, BLOOD  GENTAMICIN LEVEL, RANDOM  BASIC METABOLIC PANEL  CBC  I-STAT CG4 LACTIC ACID, ED    EKG  EKG Interpretation None       Radiology Ct Abdomen Pelvis W Contrast  Result Date: 01/23/2017 CLINICAL DATA:  Left lower quadrant abdominal pain for 1 week. Recent perforated sigmoid diverticulitis requiring percutaneous CT-guided drainage in February 2018. History of metastatic renal cell carcinoma. EXAM: CT ABDOMEN AND PELVIS WITH CONTRAST TECHNIQUE: Multidetector CT imaging of the abdomen and pelvis was performed using the standard protocol following bolus administration of intravenous contrast. CONTRAST:  33m ISOVUE-300 IOPAMIDOL (ISOVUE-300) INJECTION 61% COMPARISON:  10/08/2016 CT abdomen/ pelvis. FINDINGS: Lower chest: Enlarging 1.1 cm basilar left lower lobe solid pulmonary nodule (series 4/ image 11), previously 0.8 cm. New 0.5 cm left lower lobe solid pulmonary nodule (series 4/ image 6). Enlarging 0.7 cm anterior right lower lobe solid  pulmonary nodule (series 4/ image 3), previously 0.4 cm. Hepatobiliary: Normal liver with no liver mass. Cholecystectomy. No biliary ductal dilatation. Pancreas: Normal, with no mass or duct dilation. Spleen: Solitary hypodense 1.5 x 3.9 cm splenic mass (series 3/ image 21), previously 4.6 x 4.3 cm, not appreciably changed. Overall normal size spleen. Adrenals/Urinary Tract: Left adrenal 1.8 x 1.5 cm mass (series 3/ image 31), previously 1.8 x 1.5 cm, stable. Hyperdense 0.8 cm right adrenal nodule (  series 3/ image 25), stable. Status post left nephrectomy. Stable fat necrosis in the left nephrectomy bed. No additional mass or fluid collection in the left nephrectomy bed. No right hydronephrosis. No right renal mass. Normal bladder. Stomach/Bowel: Grossly normal stomach. Normal caliber small bowel with no small bowel wall thickening. Normal appendix. Mild sigmoid diverticulosis. Pericolonic gas containing 3.9 x 2.7 cm abscess adjacent to the sigmoid colon (series 3/ image 73), increased from 2.8 x 1.8 cm on 10/08/2016. Mild adjacent sigmoid colon wall thickening and pericolonic fat stranding. No large bowel dilatation. Vascular/Lymphatic: Atherosclerotic nonaneurysmal abdominal aorta. Patent portal, splenic, hepatic and right renal veins. No pathologically enlarged lymph nodes in the abdomen or pelvis. Reproductive: Top-normal size prostate with nonspecific internal prostatic calcifications. Other: No additional abdominopelvic fluid collections. Stable mild to moderate gynecomastia, asymmetric to the right. Hyperenhancing 1.4 cm peritoneal implant inferior to the right liver lobe (series 3/image 30), increased from 0.6 cm. Irregular hyperenhancing 6.6 x 4.9 cm right paracolic gutter peritoneal implant (series 3/image 56), increased from 5.7 x 4.7 cm. Musculoskeletal: No aggressive appearing focal osseous lesions. Moderate thoracolumbar spondylosis. IMPRESSION: 1. Pericolonic left lower quadrant 3.9 x 2.7 cm gas  containing abscess adjacent to the sigmoid colon, increased in size since 10/08/2016 CT study. 2. Progression of pulmonary metastases at the lung bases . 3. Progression of scattered peritoneal metastases. No evidence of bowel obstruction. 4. Stable splenic and bilateral adrenal metastases. These results will be called to the ordering clinician or representative by the Radiology Department at the imaging location. Electronically Signed   By: Ilona Sorrel M.D.   On: 01/23/2017 11:09    Procedures Procedures (including critical care time)  Medications Ordered in ED Medications  gentamicin (GARAMYCIN) 540 mg in dextrose 5 % 100 mL IVPB (0 mg Intravenous Stopped 01/23/17 1800)  clopidogrel (PLAVIX) tablet 75 mg (75 mg Oral Given 01/23/17 2314)  escitalopram (LEXAPRO) tablet 10 mg (10 mg Oral Given 01/23/17 2315)  levothyroxine (SYNTHROID, LEVOTHROID) tablet 25 mcg (not administered)  metroNIDAZOLE (FLAGYL) IVPB 500 mg (500 mg Intravenous New Bag/Given 01/23/17 2313)  0.9 %  sodium chloride infusion ( Intravenous New Bag/Given 01/23/17 2313)  acetaminophen (TYLENOL) tablet 650 mg (not administered)    Or  acetaminophen (TYLENOL) suppository 650 mg (not administered)  oxyCODONE (Oxy IR/ROXICODONE) immediate release tablet 5 mg (not administered)  ondansetron (ZOFRAN) tablet 4 mg (not administered)    Or  ondansetron (ZOFRAN) injection 4 mg (not administered)  morphine 2 MG/ML injection 1 mg (not administered)  ciprofloxacin (CIPRO) IVPB 400 mg (not administered)  traZODone (DESYREL) tablet 100 mg (100 mg Oral Given 01/23/17 2315)  ciprofloxacin (CIPRO) IVPB 400 mg (0 mg Intravenous Stopped 01/23/17 1510)    And  metroNIDAZOLE (FLAGYL) IVPB 500 mg (0 mg Intravenous Stopped 01/23/17 1611)  0.9 %  sodium chloride infusion ( Intravenous Transfusing/Transfer 01/23/17 2045)  sodium chloride 0.9 % bolus 1,000 mL (0 mLs Intravenous Stopped 01/23/17 1606)     Initial Impression / Assessment and Plan / ED  Course  I have reviewed the triage vital signs and the nursing notes.  Pertinent labs & imaging results that were available during my care of the patient were reviewed by me and considered in my medical decision making (see chart for details).  Clinical Course as of Jan 23 2337  Fri Jan 23, 2017  1421 Discussed plans with patient and wife.  Anticipate admission for IV abx and possible IR drainage.  Will consult with gen surg first.  [JK]  Clinical Course User Index [JK] Dorie Rank, MD   15:45: Damaris Schooner with Janett Billow from general surgery who will see the patient after he is admitted to medicine.  16:10: Spoke with hospitalist Dr. [place] Pink who will come to the ED and see the patient.    61 year old male presenting with complicated diverticulitis sent to the ED by his PCP. Discussed and evaluated the patient with Dr. Dorie Rank, attending physician. VSS. NAD. CT abdomen pelvis from earlier today shows an intra-abdominal gas-containing abscess measuring 3.9 x 2.7 cm adjacent to the sigmoid colon. Patient started on ciprofloxacin, metronidazole, and gentamicin in the emergency department. Consulted surgery who will see the patient upon admission. Spoke with the hospitalist team will admit the patient for further workup.  Final Clinical Impressions(s) / ED Diagnoses   Final diagnoses:  Diverticulitis  Pericolonic abscess due to diverticulitis    New Prescriptions Current Discharge Medication List       Joanne Gavel, PA-C 01/23/17 2339    Dorie Rank, MD 01/25/17 [phone]

## 2017-01-23 NOTE — H&P (Addendum)
[facility] History and Physical  Austin [name] UKG:[id number] DOB: 1956-06-18 DOA: 01/23/2017   PCP: [name], MD    Chief Complaint: Abdominal pain  HPI: Austin [name] is a 61 y.o. 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 Located in the left lower side. 8 out of 10 in intensity, sharp. No radiation. Some nausea but no vomiting. No discomfort with urination. Normal bowel movement yesterday. Patient and wife very frustrated that this has recurred. No diarrhea. Patient was seen by his gastroenterologist recently and a CT scan was ordered for today which shows inflammatory changes with possible abscess formation. He will be hospitalized for further management.  Home Medications: Prior to Admission medications   Medication Sig Start Date End Date Taking? Authorizing Provider  albuterol (PROVENTIL HFA;VENTOLIN HFA) 108 (90 BASE) MCG/ACT inhaler Inhale 2 puffs into the lungs every 6 (six) hours as needed for wheezing or shortness of breath. 07/07/15  Yes Debbrah Alar, NP  atorvastatin (LIPITOR) 20 MG tablet Take 20 mg by mouth daily.   Yes [provider]  clopidogrel (PLAVIX) 75 MG tablet Take 75 mg by mouth at bedtime.   Yes [provider]  escitalopram (LEXAPRO) 10 MG tablet Take 10 mg by mouth daily.   Yes [provider]  ketoconazole (NIZORAL) 2 % cream APPLY AS NEEDED TO RASH Patient taking differently: APPLY AS NEEDED TO RASH daily 10/23/16  Yes [name], [name], MD  levothyroxine (SYNTHROID, LEVOTHROID) 25 MCG tablet Take 1 tablet (25 mcg total) by mouth daily before breakfast. 12/23/16  Yes [name], [name], MD  lisinopril-hydrochlorothiazide (PRINZIDE,ZESTORETIC) 20-12.5 MG tablet Take 1 tablet by mouth daily.   Yes [provider]  Multiple Vitamin (MULTIVITAMIN WITH MINERALS) TABS tablet Take 1  tablet by mouth daily.   Yes [provider]  testosterone enanthate (DELATESTRYL) 200 MG/ML injection Inject 200 mg into the muscle every 14 (fourteen) days. For IM use only   Yes [provider]  traMADol (ULTRAM) 50 MG tablet Take 50 mg by mouth every 6 (six) hours as needed for moderate pain.   Yes [provider]  traZODone (DESYREL) 100 MG tablet Take 100 mg by mouth at bedtime as needed for sleep.   Yes [provider]  triamcinolone cream (KENALOG) 0.1 % Apply 1 application topically 2 (two) times daily as needed (for rash).   Yes [provider]  trimethoprim (TRIMPEX) 100 MG tablet Restart this after you have completed the Bactrim and Flagyl course. 10/09/16  Yes [name], PA-C    Allergies:  Allergies  Allergen Reactions  . Penicillins Rash and Other (See Comments)    Has patient had a PCN reaction causing immediate rash, facial/tongue/throat swelling, SOB or lightheadedness with hypotension: No Has patient had a PCN reaction causing severe rash involving mucus membranes or skin necrosis: No Has patient had a PCN reaction that required hospitalization No Has patient had a PCN reaction occurring within the last 10 years: No If all of the above answers are "NO", then may proceed with Cephalosporin use.    Past Medical History: Past Medical History:  Diagnosis Date  . Cerebrovascular accident ([place]) 1994   hx of brainstem stroke, residual diminished lung capacity  . Chronic kidney disease    Left Renal Mass  . Diabetes mellitus without complication ([place])   . Hx of colonic polyps   . Hypertension   .  Hypogonadism   . 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 Neuromuscular disorder ([place])    quadraplegic  . Pituitary adenoma ([place])   . 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 Sleep apnea    stopbang=4  . Urinary incontinence   . Venous stasis    edema      Past Surgical History:  Procedure Laterality Date  . CHOLECYSTECTOMY    . COLONOSCOPY  02/27/2012   Procedure: COLONOSCOPY;  Surgeon: Jerene Bears, MD;  Location: [facility];  Service: Gastroenterology;  Laterality: N/A;  . gamma knife radiation surgery  05/2010   for pituitary  . PITUITARY SURGERY  2007, 2011  . ROBOT ASSISTED LAPAROSCOPIC NEPHRECTOMY  04/23/2012   Procedure: ROBOTIC ASSISTED LAPAROSCOPIC NEPHRECTOMY;  Surgeon: Alexis Frock, MD;  Location: [facility];  Service: Urology;  Laterality: Left;  radical  . Robotic Partial Nephrectomy  04-23-12   left   Left Renal Mass  . stomach peg  02/1993   removed 5-6 years later  . TRACHEOSTOMY TUBE PLACEMENT  02/1993   removed 04/1993  . UMBILICAL HERNIA REPAIR  04/23/2012   Procedure: HERNIA REPAIR UMBILICAL ADULT;  Surgeon: Alexis Frock, MD;  Location: [facility];  Service: Urology;;  . [name]  . vocal cord surgery     injected with collagen, then fat from stomach to improve speech s/p stroke    Social History: Social History   Social History  . Marital status: Married    Spouse name: N/A  . Number of children: 3  . Years of education: N/A   Occupational History  . disabled Unemployed   Social History Main Topics  . Smoking status: Former Smoker    Packs/day: 0.50    Years: 23.00    Types: Cigarettes    Quit date: 09/24/1992  . Smokeless tobacco: Never Used  . Alcohol use No     Comment: rarley  . Drug use: No  . Sexual activity: Not on file   Other Topics Concern  . Not on file   Social History Narrative   Retired, disabled [place]     Family History:  Family History  Problem Relation Age of Onset  . Alcohol abuse Father   . Throat cancer Father   . Esophageal cancer Father 49  . Arthritis Mother   . Hyperlipidemia Mother   . Uterine cancer Mother 103  . Colon cancer Brother   . Arthritis Maternal Grandmother   . Hypertension Brother   . Stroke Paternal Grandmother      Review  of Systems - History obtained from the patient General ROS: negative Psychological ROS: negative Ophthalmic ROS: negative ENT ROS: negative Allergy and Immunology ROS: negative Hematological and Lymphatic ROS: negative Endocrine ROS: negative Respiratory ROS: no cough, shortness of breath, or wheezing Cardiovascular ROS: no chest pain or dyspnea on exertion Gastrointestinal ROS: As in history of present illness Genito-Urinary ROS: no dysuria, trouble voiding, or hematuria Musculoskeletal ROS: negative Neurological ROS: Quadriplegic Dermatological ROS: negative  Physical Examination  Vitals:   01/23/17 1409 01/23/17 1500 01/23/17 1705 01/23/17 1812  BP:  126/87 (!) 145/66 130/89  Pulse:  69 73 75  Resp:  _0 Temp:   97.8 F (36.6 C) 98.3 F (36.8 C)  TempSrc:   Oral Oral  SpO2:  98% 100% 100%  Weight: 78.5 kg (173 lb)       BP 130/89 (  BP Location: Left Arm)   Pulse 75   Temp 98.3 F (36.8 C) (Oral)   Resp 18   Wt 78.5 kg (173 lb)   SpO2 100%   BMI 23.46 kg/m   General appearance: alert, cooperative, appears stated age and no distress Head: Normocephalic, without obvious abnormality, atraumatic Eyes: conjunctivae/corneas clear. PERRL, EOM's intact.  Throat: lips, mucosa, and tongue normal; teeth and gums normal Neck: no adenopathy, no carotid bruit, no JVD, supple, symmetrical, trachea midline and thyroid not enlarged, symmetric, no tenderness/mass/nodules Resp: clear to auscultation bilaterally Cardio: regular rate and rhythm, S1, S2 normal, no murmur, click, rub or gallop GI: Abdomen soft. Tender in the left lower quadrant without any rebound or rigidity. Some guarding is present. Bowel sounds absent. No masses or organomegaly Extremities: extremities normal, atraumatic, no cyanosis or edema Pulses: 2+ and symmetric Skin: Skin color, texture, turgor normal. No rashes or lesions Lymph nodes: Cervical, supraclavicular, and axillary nodes normal. Neurologic:  Patient is quadriplegic   Labs on Admission: I have personally reviewed following labs and imaging studies  CBC:  Recent Labs Lab 01/23/17 1345  WBC 12.0*  HGB 12.0*  HCT 37.0*  MCV 85.6  PLT 935   Basic Metabolic Panel:  Recent Labs Lab 01/23/17 0918 01/23/17 1345  NA 137 136  K 4.6 5.0  CL 107 105  CO2 21 22  GLUCOSE 152* 97  BUN 35* 32*  CREATININE 1.73* 1.72*  CALCIUM 9.2 8.8*   GFR: Estimated Creatinine Clearance: 48.9 mL/min (A) (by C-G formula based on SCr of 1.72 mg/dL (H)). Liver Function Tests:  Recent Labs Lab 01/23/17 1345  AST 26  ALT 26  ALKPHOS 70  BILITOT 0.4  PROT 8.0  ALBUMIN 3.1*    Recent Labs Lab 01/23/17 1345  LIPASE 43    Radiological Exams on Admission: Ct Abdomen Pelvis W Contrast  Result Date: 01/23/2017 CLINICAL DATA:  Left lower quadrant abdominal pain for 1 week. Recent perforated sigmoid diverticulitis requiring percutaneous CT-guided drainage in February 2018. History of metastatic renal cell carcinoma. EXAM: CT ABDOMEN AND PELVIS WITH CONTRAST TECHNIQUE: Multidetector CT imaging of the abdomen and pelvis was performed using the standard protocol following bolus administration of intravenous contrast. CONTRAST:  48m ISOVUE-300 IOPAMIDOL (ISOVUE-300) INJECTION 61% COMPARISON:  10/08/2016 CT abdomen/ pelvis. FINDINGS: Lower chest: Enlarging 1.1 cm basilar left lower lobe solid pulmonary nodule (series 4/ image 11), previously 0.8 cm. New 0.5 cm left lower lobe solid pulmonary nodule (series 4/ image 6). Enlarging 0.7 cm anterior right lower lobe solid pulmonary nodule (series 4/ image 3), previously 0.4 cm. Hepatobiliary: Normal liver with no liver mass. Cholecystectomy. No biliary ductal dilatation. Pancreas: Normal, with no mass or duct dilation. Spleen: Solitary hypodense 1.5 x 3.9 cm splenic mass (series 3/ image 21), previously 4.6 x 4.3 cm, not appreciably changed. Overall normal size spleen. Adrenals/Urinary Tract: Left  adrenal 1.8 x 1.5 cm mass (series 3/ image 31), previously 1.8 x 1.5 cm, stable. Hyperdense 0.8 cm right adrenal nodule (series 3/ image 25), stable. Status post left nephrectomy. Stable fat necrosis in the left nephrectomy bed. No additional mass or fluid collection in the left nephrectomy bed. No right hydronephrosis. No right renal mass. Normal bladder. Stomach/Bowel: Grossly normal stomach. Normal caliber small bowel with no small bowel wall thickening. Normal appendix. Mild sigmoid diverticulosis. Pericolonic gas containing 3.9 x 2.7 cm abscess adjacent to the sigmoid colon (series 3/ image 73), increased from 2.8 x 1.8 cm on 10/08/2016. Mild adjacent sigmoid colon  wall thickening and pericolonic fat stranding. No large bowel dilatation. Vascular/Lymphatic: Atherosclerotic nonaneurysmal abdominal aorta. Patent portal, splenic, hepatic and right renal veins. No pathologically enlarged lymph nodes in the abdomen or pelvis. Reproductive: Top-normal size prostate with nonspecific internal prostatic calcifications. Other: No additional abdominopelvic fluid collections. Stable mild to moderate gynecomastia, asymmetric to the right. Hyperenhancing 1.4 cm peritoneal implant inferior to the right liver lobe (series 3/image 30), increased from 0.6 cm. Irregular hyperenhancing 6.6 x 4.9 cm right paracolic gutter peritoneal implant (series 3/image 56), increased from 5.7 x 4.7 cm. Musculoskeletal: No aggressive appearing focal osseous lesions. Moderate thoracolumbar spondylosis. IMPRESSION: 1. Pericolonic left lower quadrant 3.9 x 2.7 cm gas containing abscess adjacent to the sigmoid colon, increased in size since 10/08/2016 CT study. 2. Progression of pulmonary metastases at the lung bases . 3. Progression of scattered peritoneal metastases. No evidence of bowel obstruction. 4. Stable splenic and bilateral adrenal metastases. These results will be called to the ordering clinician or representative by the Radiology  Department at the imaging location. Electronically Signed   By: Ilona Sorrel M.D.   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 Intra-abdominal abscess, likely secondary to diverticulitis: General surgery consulted. Patient allergic to penicillin. He'll be placed on Cipro, Flagyl and gentamicin. Pain control. Follow up on cultures. During previous hospitalization he did have C. difficile., But currently without diarrhea.  #2 mild acute renal insufficiency: Gentle IV hydration. Monitor urine output. Repeat labs tomorrow.  #3. Quadriplegia due to brainstem stroke: Stable. He is on Plavix, which will be continued for now unless surgery decides to operate.  #4. Hypothyroidism: Continue with Synthroid. Next  #5 history of essential hypertension: Monitor blood pressures closely.  #6 history of metastatic renal cell cancer: Followed by oncology.  DVT Prophylaxis: SCDs Code Status: Full code Family Communication: Discussed with the patient and his wife  Consults called: General surgery  Admission status: Inpatient   Severity of Illness: The appropriate patient status for this patient is INPATIENT. Inpatient status is judged to be reasonable and necessary in order to provide the required intensity of service to ensure the patient's safety. The patient's presenting symptoms, physical exam findings, and initial radiographic and laboratory data in the context of their chronic comorbidities is felt to place them at high risk for further clinical deterioration. Furthermore, it is not anticipated that the patient will be medically stable for discharge  from the hospital within 2 midnights of admission. The following factors support the patient status of inpatient.   " The patient's presenting symptoms include abdominal pain. " The worrisome physical exam findings include abdominal tenderness. " The initial radiographic and laboratory data are worrisome because of intra-abdominal abscess. " The chronic co-morbidities include hypertension, quadriplegia.   * I certify that at the point of admission it is my clinical judgment that the patient will require inpatient hospital care spanning beyond 2 midnights from the point of admission due to high intensity of service, high risk for further deterioration and high frequency of surveillance required.*    Further management decisions will depend on results of further testing and patient's response to treatment.   [facility]  Pager [phone]  If 7PM-7AM, please contact night-coverage [email or web address] Password [facility]  01/23/2017, 6:28 PM

## 2017-01-23 NOTE — Telephone Encounter (Signed)
Please advise patient to go to ER, he will need IV antibiotics for complicated diverticulitis with abscess and ?perforation.

## 2017-01-23 NOTE — Telephone Encounter (Signed)
Pt scheduled for CT of A/P at [facility] 01/23/17@2 :30pm. Pt to have labs done and be NPO after 10:30am, drink bottle 1 of contrast at 12:30pm, bottle 2 @1 :30pm. Pt aware of appt.   Pt was scheduled on the wrong day by central scheduling. Pts CT rescheduled to 01/23/17@[facility]. Pt aware.

## 2017-01-24 ENCOUNTER — Encounter (HOSPITAL_COMMUNITY): Payer: Self-pay | Admitting: Radiology

## 2017-01-24 DIAGNOSIS — C649 Malignant neoplasm of unspecified kidney, except renal pelvis: Secondary | ICD-10-CM | POA: Diagnosis not present

## 2017-01-24 DIAGNOSIS — K5720 Diverticulitis of large intestine with perforation and abscess without bleeding: Principal | ICD-10-CM

## 2017-01-24 LAB — BASIC METABOLIC PANEL
ANION GAP: 6 (ref 5–15)
BUN: 26 mg/dL — ABNORMAL HIGH (ref 6–20)
CALCIUM: 8.1 mg/dL — AB (ref 8.9–10.3)
CO2: 21 mmol/L — AB (ref 22–32)
Chloride: 110 mmol/L (ref 101–111)
Creatinine, Ser: 1.49 mg/dL — ABNORMAL HIGH (ref 0.61–1.24)
GFR calc Af Amer: 56 mL/min — ABNORMAL LOW (ref >60)
GFR, EST NON AFRICAN AMERICAN: 49 mL/min — AB (ref >60)
GLUCOSE: 112 mg/dL — AB (ref 65–99)
POTASSIUM: 4.6 mmol/L (ref 3.5–5.1)
Sodium: 137 mmol/L (ref 135–145)

## 2017-01-24 LAB — CBC
HEMATOCRIT: 34.4 % — AB (ref 39.0–52.0)
HEMOGLOBIN: 11 g/dL — AB (ref 13.0–17.0)
MCH: 27.3 pg (ref 26.0–34.0)
MCHC: 32 g/dL (ref 30.0–36.0)
MCV: 85.4 fL (ref 78.0–100.0)
Platelets: 207 10*3/uL (ref 150–400)
RBC: 4.03 MIL/uL — ABNORMAL LOW (ref 4.22–5.81)
RDW: 16.9 % — AB (ref 11.5–15.5)
WBC: 10.5 10*3/uL (ref 4.0–10.5)

## 2017-01-24 LAB — GENTAMICIN LEVEL, RANDOM: GENTAMICIN RM: 12.4 ug/mL — AB

## 2017-01-24 MED ORDER — GENTAMICIN SULFATE 40 MG/ML IJ SOLN
120.0000 mg | Freq: Two times a day (BID) | INTRAVENOUS | Status: DC
  Filled 2017-01-24: qty 3

## 2017-01-24 NOTE — Progress Notes (Signed)
Pharmacy Antibiotic Follow-up Note  Austin [name] is a 61 y.o. year-old male admitted on 01/23/2017.  The patient is currently on day 1 of Gentamicin for intra-abdominal infection, diverticulitis.  Assessment/Plan: Gent Random = 12.4 Change Gentamicin to 120 mg IV q12h next dose 6/17 at 4pm F/u scr, additional levels as indicated   Temp (24hrs), Avg:98 F (36.7 C), Min:97.6 F (36.4 C), Max:98.4 F (36.9 C)   Recent Labs Lab 01/23/17 1345 01/24/17 0259  WBC 12.0* 10.5     Recent Labs Lab 01/23/17 0918 01/23/17 1345 01/24/17 0259  CREATININE 1.73* 1.72* 1.49*   Estimated Creatinine Clearance: 57.1 mL/min (A) (by C-G formula based on SCr of 1.49 mg/dL (H)).    Allergies  Allergen Reactions  . Penicillins Rash and Other (See Comments)    Has patient had a PCN reaction causing immediate rash, facial/tongue/throat swelling, SOB or lightheadedness with hypotension: No Has patient had a PCN reaction causing severe rash involving mucus membranes or skin necrosis: No Has patient had a PCN reaction that required hospitalization No Has patient had a PCN reaction occurring within the last 10 years: No If all of the above answers are "NO", then may proceed with Cephalosporin use.    Antimicrobials this admission: 6/15 Cipro/Flagyl x 1 in ED 6/15 Gentamicin >>  Levels/dose changes this admission: [place] Random=12.4, will change to traditional dosing per nomogram  Microbiology results: 6/15 BCx: sent  Thank you for allowing pharmacy to be a part of this patient's care.   Dorrene German 01/24/2017, 6:19 AM

## 2017-01-24 NOTE — Progress Notes (Signed)
Pt with couple of loose stools. Wife is requesting imodium for diarrhea. Dr. Darrick Meigs made aware. Order given to check for c-diff and if negative will order anti-diarrheal med. Wife made aware. Hale Bogus.

## 2017-01-24 NOTE — Progress Notes (Signed)
[facility]  PROGRESS NOTE  Austin [name] KYH:[id number] DOB: 07-Apr-1956 DOA: 01/23/2017 PCP: [name], MD   Brief HPI:     61 y.o. 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 Located in the left lower side. 8 out of 10 in intensity, sharp. No radiation. Some nausea but no vomiting. No discomfort with urination. Normal bowel movement yesterday. Patient and wife very frustrated that this has recurred. No diarrhea. Patient was seen by his gastroenterologist recently and a CT scan was ordered for today which shows inflammatory changes with possible abscess formation. He will be hospitalized for further management.    Subjective   Patient seen and examined,. Denies abdominal pain this morning.   Assessment/Plan:     1. Intra-abdominal abscess,secondary to diverticulitis- Gen. surgery following, patient is on Cipro, Flagyl and gentamicin. IR consulted for drainage of abscess.WBC is 10.5. 2. Acute kidney injury- patient was injected with creatinine of 1.7 to admission, improved to 1.49 with IV fluids. Patient's baseline creatinine is around 1. Follow BMP in a.m. 3.  Hypothyroidism- patient's medication list showed Synthroid and was started by admitting physician. As per nursing staff patient's wife said he was recently taken off Synthroid as it  Was causing muscle spasm. As per note dated 01/20/2017 from [facility], Dr. [name] held levothyroxine for a week. She'll be restarted on 01/28/2017. 4. 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 5. History of essential hypertension- blood pressure stable. 6. History of metastatic renal cell cancer- followed by oncology as outpatient  DVT prophylaxis: SCDs  Code Status: Full code  Family Communication: No family present at bedside   Disposition Plan:  Pending improvement in diverticulitis   Consultants:  Gen. surgery  Procedures:  None   Continuous infusions . sodium chloride 75 mL/hr at 01/23/17 2313  . ciprofloxacin Stopped (01/24/17 0300)  . [START ON 01/25/2017] gentamicin    . metronidazole Stopped (01/24/17 0700)      Antibiotics:   Anti-infectives    Start     Dose/Rate Route Frequency Ordered Stop   01/25/17 1600  gentamicin (GARAMYCIN) 120 mg in dextrose 5 % 50 mL IVPB     120 mg 106 mL/hr over 30 Minutes Intravenous Every 12 hours 01/24/17 0624     01/24/17 0200  ciprofloxacin (CIPRO) IVPB 400 mg     400 mg 200 mL/hr over 60 Minutes Intravenous Every 12 hours 01/23/17 2205     01/23/17 2200  metroNIDAZOLE (FLAGYL) IVPB 500 mg     500 mg 100 mL/hr over 60 Minutes Intravenous Every 8 hours 01/23/17 2158     01/23/17 1600  gentamicin (GARAMYCIN) 540 mg in dextrose 5 % 100 mL IVPB  Status:  Discontinued     540 mg 113.5 mL/hr over 60 Minutes Intravenous Every 24 hours 01/23/17 1444 01/24/17 0623   01/23/17 1430  ciprofloxacin (CIPRO) IVPB 400 mg     400 mg 200 mL/hr over 60 Minutes Intravenous  Once 01/23/17 1351 01/23/17 1510   01/23/17 1430  metroNIDAZOLE (FLAGYL) IVPB 500 mg     500 mg 100 mL/hr over 60 Minutes Intravenous  Once 01/23/17 1351 01/23/17 1611       Objective   Vitals:   01/23/17 2000 01/23/17 2100 01/23/17 2155 01/24/17 0422  BP: (!) 166/90 138/74 (!) 161/90 (!) 145/70  Pulse: 79 73 88 70  Resp:  18  18 17   Temp:    97.7 F (36.5 C)  TempSrc:    Oral  SpO2: 100% 96% 97% 99%  Weight:   78.5 kg (173 lb 1 oz)   Height:   6\' 1"  (1.854 m)     Intake/Output Summary (Last 24 hours) at 01/24/17 1237 Last data filed at 01/24/17 0913  Gross per 24 hour  Intake           1463.5 ml  Output             1450 ml  Net             13.5 ml   Filed Weights   01/23/17 1409 01/23/17 2155  Weight: 78.5 kg (173 lb) 78.5 kg (173 lb 1 oz)     Physical Examination:   Physical Exam: Eyes: No  icterus, extraocular muscles intact  Mouth: Oral mucosa is moist, no lesions on palate,  Neck: Supple, no deformities, masses, or tenderness Lungs: Normal respiratory effort, bilateral clear to auscultation, no crackles or wheezes.  Heart: Regular rate and rhythm, S1 and S2 normal, no murmurs, rubs auscultated Abdomen: BS normoactive,soft,nondistended,tender to palpation inLLQ,no organomegaly Extremities: No pretibial edema, no erythema, no cyanosis, no clubbing Neuro : Alert and oriented to time, place and person, No focal deficits  Skin: No rashes seen on exam     Data Reviewed: I have personally reviewed following labs and imaging studies  CBG: No results for input(s): GLUCAP in the last 168 hours.  CBC:  Recent Labs Lab 01/23/17 1345 01/24/17 0259  WBC 12.0* 10.5  HGB 12.0* 11.0*  HCT 37.0* 34.4*  MCV 85.6 85.4  PLT 235 967    Basic Metabolic Panel:  Recent Labs Lab 01/23/17 0918 01/23/17 1345 01/24/17 0259  NA 137 136 137  K 4.6 5.0 4.6  CL 107 105 110  CO2 21 22 21*  GLUCOSE 152* 97 112*  BUN 35* 32* 26*  CREATININE 1.73* 1.72* 1.49*  CALCIUM 9.2 8.8* 8.1*    Recent Results (from the past 240 hour(s))  Blood culture (routine x 2)     Status: None (Preliminary result)   Collection Time: 01/23/17  1:43 PM  Result Value Ref Range Status   Specimen Description BLOOD LEFT HAND  Final   Special Requests IN PEDIATRIC BOTTLE Blood Culture adequate volume  Final   Culture   Final    NO GROWTH < 24 HOURS Performed at [facility], [address] [place], [place] [address]    Report Status PENDING  Incomplete  Blood culture (routine x 2)     Status: None (Preliminary result)   Collection Time: 01/23/17  1:45 PM  Result Value Ref Range Status   Specimen Description BLOOD RIGHT HAND  Final   Special Requests   Final    BOTTLES DRAWN AEROBIC AND ANAEROBIC Blood Culture adequate volume   Culture   Final    NO GROWTH < 24 HOURS Performed at [facility], [place] [address], [place], [place] [address]    Report Status PENDING  Incomplete     Liver Function Tests:  Recent Labs Lab 01/23/17 1345  AST 26  ALT 26  ALKPHOS 70  BILITOT 0.4  PROT 8.0  ALBUMIN 3.1*    Recent Labs Lab 01/23/17 1345  LIPASE 43   No results for input(s): AMMONIA in the last 168 hours.    Studies: Ct Abdomen Pelvis W Contrast  Result Date: 01/23/2017  CLINICAL DATA:  Left lower quadrant abdominal pain for 1 week. Recent perforated sigmoid diverticulitis requiring percutaneous CT-guided drainage in February 2018. History of metastatic renal cell carcinoma. EXAM: CT ABDOMEN AND PELVIS WITH CONTRAST TECHNIQUE: Multidetector CT imaging of the abdomen and pelvis was performed using the standard protocol following bolus administration of intravenous contrast. CONTRAST:  46mL ISOVUE-300 IOPAMIDOL (ISOVUE-300) INJECTION 61% COMPARISON:  10/08/2016 CT abdomen/ pelvis. FINDINGS: Lower chest: Enlarging 1.1 cm basilar left lower lobe solid pulmonary nodule (series 4/ image 11), previously 0.8 cm. New 0.5 cm left lower lobe solid pulmonary nodule (series 4/ image 6). Enlarging 0.7 cm anterior right lower lobe solid pulmonary nodule (series 4/ image 3), previously 0.4 cm. Hepatobiliary: Normal liver with no liver mass. Cholecystectomy. No biliary ductal dilatation. Pancreas: Normal, with no mass or duct dilation. Spleen: Solitary hypodense 1.5 x 3.9 cm splenic mass (series 3/ image 21), previously 4.6 x 4.3 cm, not appreciably changed. Overall normal size spleen. Adrenals/Urinary Tract: Left adrenal 1.8 x 1.5 cm mass (series 3/ image 31), previously 1.8 x 1.5 cm, stable. Hyperdense 0.8 cm right adrenal nodule (series 3/ image 25), stable. Status post left nephrectomy. Stable fat necrosis in the left nephrectomy bed. No additional mass or fluid collection in the left nephrectomy bed. No right hydronephrosis. No right renal mass. Normal bladder. Stomach/Bowel: Grossly normal  stomach. Normal caliber small bowel with no small bowel wall thickening. Normal appendix. Mild sigmoid diverticulosis. Pericolonic gas containing 3.9 x 2.7 cm abscess adjacent to the sigmoid colon (series 3/ image 73), increased from 2.8 x 1.8 cm on 10/08/2016. Mild adjacent sigmoid colon wall thickening and pericolonic fat stranding. No large bowel dilatation. Vascular/Lymphatic: Atherosclerotic nonaneurysmal abdominal aorta. Patent portal, splenic, hepatic and right renal veins. No pathologically enlarged lymph nodes in the abdomen or pelvis. Reproductive: Top-normal size prostate with nonspecific internal prostatic calcifications. Other: No additional abdominopelvic fluid collections. Stable mild to moderate gynecomastia, asymmetric to the right. Hyperenhancing 1.4 cm peritoneal implant inferior to the right liver lobe (series 3/image 30), increased from 0.6 cm. Irregular hyperenhancing 6.6 x 4.9 cm right paracolic gutter peritoneal implant (series 3/image 56), increased from 5.7 x 4.7 cm. Musculoskeletal: No aggressive appearing focal osseous lesions. Moderate thoracolumbar spondylosis. IMPRESSION: 1. Pericolonic left lower quadrant 3.9 x 2.7 cm gas containing abscess adjacent to the sigmoid colon, increased in size since 10/08/2016 CT study. 2. Progression of pulmonary metastases at the lung bases . 3. Progression of scattered peritoneal metastases. No evidence of bowel obstruction. 4. Stable splenic and bilateral adrenal metastases. These results will be called to the ordering clinician or representative by the Radiology Department at the imaging location. Electronically Signed   By: Ilona Sorrel M.D.   On: 01/23/2017 11:09    Scheduled Meds: . escitalopram  10 mg Oral Daily  . traZODone  100 mg Oral QHS      Time spent: 25 min  [facility] Pager [phone]. If 7PM-7AM, please contact night-coverage at [email or web address], Office  [phone]  password [email or web address] 01/24/2017, 12:37 PM   LOS: 1 day

## 2017-01-24 NOTE — Progress Notes (Signed)
CRITICAL VALUE ALERT  Critical Value:  Gentamin 12.4   Date & Time Notied:  6:32 AM   Provider Notified: Walden Field NP  Orders Received/Actions taken:

## 2017-01-24 NOTE — Progress Notes (Addendum)
[facility] Office:  [phone] General Surgery Progress Note   LOS: 1 day  POD -     Chief Complaint: Abdominal pain  Assessment and Plan: 1.  Diverticulitis with focal abscess  Cipro/Flagyl/gentamicin - 6/15 >>>  WBC - 10,500 - 01/24/2017  Still sore in LLQ, but better.  The patient has limited verbal skills, which makes communication difficult.  Wonder whether palliative care ought to be involved - surgery on him would be high risk, would almost certainly require a colostomy.  Addendum:  I had lengthy discussion with patient and his wife.  Will try IR drainage of abscess.  I discussed this with Dr. Earleen Newport. To stop Plavix.  2.  Recurrent Metastatic renal cell cancer, s/p left radical nephrectomy 9/132013, peritoneal metastasis 2015  Followed by Dr. Jerilynn Mages 3.  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  History of C. Diff 5.  DVT prophylaxis - on hold 6.  Creatinine - 1.49 - 01/24/2017 7.  On Plavix - last dose 6/15. 8.  Sleep apnea 9.  DM   Principal Problem:   Intra-abdominal abscess ([place]) Active Problems:   Renal carcinoma metastatic with carcinomatosis   Quadriplegia from brainstem stroke   Subjective:  Doing okay.  He is better than yesterday (as best I can tell)  Objective:   Vitals:   01/23/17 2155 01/24/17 0422  BP: (!) 161/90 (!) 145/70  Pulse: 88 70  Resp: 18 17  Temp:  97.7 F (36.5 C)     Intake/Output from previous day:  06/15 0701 - 06/16 0700 In: 1463.5 [P.O.:50; IV Piggyback:1413.5] Out: 1450 [Urine:1450]  Intake/Output this shift:  No intake/output data recorded.   Physical Exam:   General: WM who is alert and oriented. But his speech is hard to follow.   HEENT: Normal. Pupils equal. .   Lungs: Clear   Abdomen: Soft.  Has abdominal wall hernia. Some tenderness in LLQ   Neurologic:  Contractured left hand/arm, limited movement of right hand   Lab Results:    Recent Labs  01/23/17 1345 01/24/17 0259  WBC 12.0*  10.5  HGB 12.0* 11.0*  HCT 37.0* 34.4*  PLT 235 207    BMET   Recent Labs  01/23/17 1345 01/24/17 0259  NA 136 137  K 5.0 4.6  CL 105 110  CO2 22 21*  GLUCOSE 97 112*  BUN 32* 26*  CREATININE 1.72* 1.49*  CALCIUM 8.8* 8.1*    PT/INR  No results for input(s): LABPROT, INR in the last 72 hours.  ABG  No results for input(s): PHART, HCO3 in the last 72 hours.  Invalid input(s): PCO2, PO2   Studies/Results:  Ct Abdomen Pelvis W Contrast  Result Date: 01/23/2017 CLINICAL DATA:  Left lower quadrant abdominal pain for 1 week. Recent perforated sigmoid diverticulitis requiring percutaneous CT-guided drainage in February 2018. History of metastatic renal cell carcinoma. EXAM: CT ABDOMEN AND PELVIS WITH CONTRAST TECHNIQUE: Multidetector CT imaging of the abdomen and pelvis was performed using the standard protocol following bolus administration of intravenous contrast. CONTRAST:  51mL ISOVUE-300 IOPAMIDOL (ISOVUE-300) INJECTION 61% COMPARISON:  10/08/2016 CT abdomen/ pelvis. FINDINGS: Lower chest: Enlarging 1.1 cm basilar left lower lobe solid pulmonary nodule (series 4/ image 11), previously 0.8 cm. New 0.5 cm left lower lobe solid pulmonary nodule (series 4/ image 6). Enlarging 0.7 cm anterior right lower lobe solid pulmonary nodule (series 4/ image 3), previously 0.4 cm. Hepatobiliary: Normal liver with no liver mass. Cholecystectomy. No biliary ductal dilatation. Pancreas: Normal, with  no mass or duct dilation. Spleen: Solitary hypodense 1.5 x 3.9 cm splenic mass (series 3/ image 21), previously 4.6 x 4.3 cm, not appreciably changed. Overall normal size spleen. Adrenals/Urinary Tract: Left adrenal 1.8 x 1.5 cm mass (series 3/ image 31), previously 1.8 x 1.5 cm, stable. Hyperdense 0.8 cm right adrenal nodule (series 3/ image 25), stable. Status post left nephrectomy. Stable fat necrosis in the left nephrectomy bed. No additional mass or fluid collection in the left nephrectomy bed. No right  hydronephrosis. No right renal mass. Normal bladder. Stomach/Bowel: Grossly normal stomach. Normal caliber small bowel with no small bowel wall thickening. Normal appendix. Mild sigmoid diverticulosis. Pericolonic gas containing 3.9 x 2.7 cm abscess adjacent to the sigmoid colon (series 3/ image 73), increased from 2.8 x 1.8 cm on 10/08/2016. Mild adjacent sigmoid colon wall thickening and pericolonic fat stranding. No large bowel dilatation. Vascular/Lymphatic: Atherosclerotic nonaneurysmal abdominal aorta. Patent portal, splenic, hepatic and right renal veins. No pathologically enlarged lymph nodes in the abdomen or pelvis. Reproductive: Top-normal size prostate with nonspecific internal prostatic calcifications. Other: No additional abdominopelvic fluid collections. Stable mild to moderate gynecomastia, asymmetric to the right. Hyperenhancing 1.4 cm peritoneal implant inferior to the right liver lobe (series 3/image 30), increased from 0.6 cm. Irregular hyperenhancing 6.6 x 4.9 cm right paracolic gutter peritoneal implant (series 3/image 56), increased from 5.7 x 4.7 cm. Musculoskeletal: No aggressive appearing focal osseous lesions. Moderate thoracolumbar spondylosis. IMPRESSION: 1. Pericolonic left lower quadrant 3.9 x 2.7 cm gas containing abscess adjacent to the sigmoid colon, increased in size since 10/08/2016 CT study. 2. Progression of pulmonary metastases at the lung bases . 3. Progression of scattered peritoneal metastases. No evidence of bowel obstruction. 4. Stable splenic and bilateral adrenal metastases. These results will be called to the ordering clinician or representative by the Radiology Department at the imaging location. Electronically Signed   By: Ilona Sorrel M.D.   On: 01/23/2017 11:09     Anti-infectives:   Anti-infectives    Start     Dose/Rate Route Frequency Ordered Stop   01/25/17 1600  gentamicin (GARAMYCIN) 120 mg in dextrose 5 % 50 mL IVPB     120 mg 106 mL/hr over 30  Minutes Intravenous Every 12 hours 01/24/17 0624     01/24/17 0200  ciprofloxacin (CIPRO) IVPB 400 mg     400 mg 200 mL/hr over 60 Minutes Intravenous Every 12 hours 01/23/17 2205     01/23/17 2200  metroNIDAZOLE (FLAGYL) IVPB 500 mg     500 mg 100 mL/hr over 60 Minutes Intravenous Every 8 hours 01/23/17 2158     01/23/17 1600  gentamicin (GARAMYCIN) 540 mg in dextrose 5 % 100 mL IVPB  Status:  Discontinued     540 mg 113.5 mL/hr over 60 Minutes Intravenous Every 24 hours 01/23/17 1444 01/24/17 0623   01/23/17 1430  ciprofloxacin (CIPRO) IVPB 400 mg     400 mg 200 mL/hr over 60 Minutes Intravenous  Once 01/23/17 1351 01/23/17 1510   01/23/17 1430  metroNIDAZOLE (FLAGYL) IVPB 500 mg     500 mg 100 mL/hr over 60 Minutes Intravenous  Once 01/23/17 1351 01/23/17 1611      Alphonsa Overall, MD, FACS Pager: [facility] Office: [phone] 01/24/2017

## 2017-01-24 NOTE — Progress Notes (Signed)
Chief Complaint: Patient was seen in consultation today for  Chief Complaint  Patient presents with  . Abscess    abdomen   at the request of Dr. Alphonsa Overall  Referring Physician(s): Dr. Alphonsa Overall  Supervising Physician: Corrie Mckusick  Patient Status: [facility] - In-pt  History of Present Illness: Austin [name] is a 60 y.o. male with multiple medical issues including recurrent metastatic renal cell carcinoma. 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 He has been admitted with diverticulitis and focal abscess, which has enlarged since his prior scan. Surgical team has consulted and requests IR place perc drain. Chart, imaging reviewed and discussed with Dr. Earleen Newport. Pt on long term Plavix for hx of CVA, but this has been held since admission. Wife currently not at bedisde  Past Medical History:  Diagnosis Date  . Cerebrovascular accident ([place]) 1994   hx of brainstem stroke, residual diminished lung capacity  . Chronic kidney disease    Left Renal Mass  . Diabetes mellitus without complication ([place])   . Hx of colonic polyps   . Hypertension   . Hypogonadism   . 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 Pituitary adenoma ([place])   . 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 Sleep apnea    stopbang=4  . Urinary incontinence   . Venous stasis    edema    Past Surgical History:  Procedure Laterality Date  . CHOLECYSTECTOMY    . COLONOSCOPY  02/27/2012   Procedure: COLONOSCOPY;  Surgeon: Jerene Bears, MD;  Location: [facility];  Service: Gastroenterology;  Laterality: N/A;  . gamma knife radiation surgery  05/2010   for pituitary  . PITUITARY SURGERY  2007, 2011  . ROBOT ASSISTED LAPAROSCOPIC NEPHRECTOMY  04/23/2012   Procedure: ROBOTIC ASSISTED LAPAROSCOPIC NEPHRECTOMY;  Surgeon: Alexis Frock, MD;  Location: [facility];  Service:  Urology;  Laterality: Left;  radical  . Robotic Partial Nephrectomy  04-23-12   left   Left Renal Mass  . stomach peg  02/1993   removed 5-6 years later  . TRACHEOSTOMY TUBE PLACEMENT  02/1993   removed 04/1993  . UMBILICAL HERNIA REPAIR  04/23/2012   Procedure: HERNIA REPAIR UMBILICAL ADULT;  Surgeon: Alexis Frock, MD;  Location: [facility];  Service: Urology;;  . [name]  . vocal cord surgery     injected with collagen, then fat from stomach to improve speech s/p stroke    Allergies: Penicillins  Medications: Scheduled Meds: . escitalopram  10 mg Oral Daily  . traZODone  100 mg Oral QHS   Continuous Infusions: . sodium chloride 75 mL/hr at 01/23/17 2313  . ciprofloxacin Stopped (01/24/17 0300)  . [START ON 01/25/2017] gentamicin    . metronidazole Stopped (01/24/17 0700)   PRN Meds:.acetaminophen **OR** acetaminophen, morphine injection, ondansetron **OR** ondansetron (ZOFRAN) IV, oxyCODONE    Family History  Problem Relation Age of Onset  . Alcohol abuse Father   . Throat cancer Father   . Esophageal cancer Father 42  . Arthritis Mother   . Hyperlipidemia Mother   . Uterine cancer Mother 63  . Colon cancer Brother   . Arthritis Maternal Grandmother   . Hypertension Brother   . Stroke Paternal Grandmother     Social History   Social History  . Marital status: Married    Spouse name:  N/A  . Number of children: 3  . Years of education: N/A   Occupational History  . disabled Unemployed   Social History Main Topics  . Smoking status: Former Smoker    Packs/day: 0.50    Years: 23.00    Types: Cigarettes    Quit date: 09/24/1992  . Smokeless tobacco: Never Used  . Alcohol use No     Comment: rarley  . Drug use: No  . Sexual activity: Not Asked   Other Topics Concern  . None   Social History Narrative   Retired, disabled [place]: A 12 point ROS discussed and pertinent positives are indicated in the HPI above.   All other systems are negative.  Review of Systems  Vital Signs: BP (!) 145/70 (BP Location: Right Arm)   Pulse 70   Temp 97.7 F (36.5 C) (Oral)   Resp 17   Ht _0  (1.854 m)   Wt 173 lb 1 oz (78.5 kg)   SpO2 99%   BMI 22.83 kg/m   Physical Exam  Constitutional: He is oriented to person, place, and time. He appears well-developed. No distress.  HENT:  Head: Normocephalic.  Mouth/Throat: Oropharynx is clear and moist.  Neck: Normal range of motion. No JVD present. No tracheal deviation present.  Cardiovascular: Normal rate, regular rhythm and normal heart sounds.   Pulmonary/Chest: Effort normal and breath sounds normal. No respiratory distress.  Abdominal: Soft. He exhibits no distension.  Mild LLQ tenderness  Neurological: He is alert and oriented to person, place, and time.  Difficult to understand his speech as his voice is whispered. He seems to understand our discussion  Skin: Skin is warm and dry.  Psychiatric: He has a normal mood and affect.    Mallampati Score:  MD Evaluation Airway: WNL Heart: WNL Abdomen: WNL Chest/ Lungs: WNL ASA  Classification: 3 Mallampati/Airway Score: Two  Imaging: Ct Abdomen Pelvis W Contrast  Result Date: 01/23/2017 CLINICAL DATA:  Left lower quadrant abdominal pain for 1 week. Recent perforated sigmoid diverticulitis requiring percutaneous CT-guided drainage in February 2018. History of metastatic renal cell carcinoma. EXAM: CT ABDOMEN AND PELVIS WITH CONTRAST TECHNIQUE: Multidetector CT imaging of the abdomen and pelvis was performed using the standard protocol following bolus administration of intravenous contrast. CONTRAST:  68m ISOVUE-300 IOPAMIDOL (ISOVUE-300) INJECTION 61% COMPARISON:  10/08/2016 CT abdomen/ pelvis. FINDINGS: Lower chest: Enlarging 1.1 cm basilar left lower lobe solid pulmonary nodule (series 4/ image 11), previously 0.8 cm. New 0.5 cm left lower lobe solid pulmonary nodule (series 4/ image 6). Enlarging 0.7  cm anterior right lower lobe solid pulmonary nodule (series 4/ image 3), previously 0.4 cm. Hepatobiliary: Normal liver with no liver mass. Cholecystectomy. No biliary ductal dilatation. Pancreas: Normal, with no mass or duct dilation. Spleen: Solitary hypodense 1.5 x 3.9 cm splenic mass (series 3/ image 21), previously 4.6 x 4.3 cm, not appreciably changed. Overall normal size spleen. Adrenals/Urinary Tract: Left adrenal 1.8 x 1.5 cm mass (series 3/ image 31), previously 1.8 x 1.5 cm, stable. Hyperdense 0.8 cm right adrenal nodule (series 3/ image 25), stable. Status post left nephrectomy. Stable fat necrosis in the left nephrectomy bed. No additional mass or fluid collection in the left nephrectomy bed. No right hydronephrosis. No right renal mass. Normal bladder. Stomach/Bowel: Grossly normal stomach. Normal caliber small bowel with no small bowel wall thickening. Normal appendix. Mild sigmoid diverticulosis. Pericolonic gas containing 3.9 x 2.7 cm abscess adjacent to the sigmoid  colon (series 3/ image 73), increased from 2.8 x 1.8 cm on 10/08/2016. Mild adjacent sigmoid colon wall thickening and pericolonic fat stranding. No large bowel dilatation. Vascular/Lymphatic: Atherosclerotic nonaneurysmal abdominal aorta. Patent portal, splenic, hepatic and right renal veins. No pathologically enlarged lymph nodes in the abdomen or pelvis. Reproductive: Top-normal size prostate with nonspecific internal prostatic calcifications. Other: No additional abdominopelvic fluid collections. Stable mild to moderate gynecomastia, asymmetric to the right. Hyperenhancing 1.4 cm peritoneal implant inferior to the right liver lobe (series 3/image 30), increased from 0.6 cm. Irregular hyperenhancing 6.6 x 4.9 cm right paracolic gutter peritoneal implant (series 3/image 56), increased from 5.7 x 4.7 cm. Musculoskeletal: No aggressive appearing focal osseous lesions. Moderate thoracolumbar spondylosis. IMPRESSION: 1. Pericolonic left  lower quadrant 3.9 x 2.7 cm gas containing abscess adjacent to the sigmoid colon, increased in size since 10/08/2016 CT study. 2. Progression of pulmonary metastases at the lung bases . 3. Progression of scattered peritoneal metastases. No evidence of bowel obstruction. 4. Stable splenic and bilateral adrenal metastases. These results will be called to the ordering clinician or representative by the Radiology Department at the imaging location. Electronically Signed   By: Ilona Sorrel M.D.   On: 01/23/2017 11:09    Labs:  CBC:  Recent Labs  10/08/16 0442 11/11/16 0913 01/23/17 1345 01/24/17 0259  WBC 6.6 13.3* 12.0* 10.5  HGB 10.2* 13.0 12.0* 11.0*  HCT 31.4* 39.4 37.0* 34.4*  PLT 248 316 235 207    COAGS:  Recent Labs  10/02/16 2353  INR 1.32    BMP:  Recent Labs  10/04/16 0453 10/08/16 0442 11/11/16 0913 01/23/17 0918 01/23/17 1345 01/24/17 0259  NA 139 140 135* 137 136 137  K 4.2 4.0 4.6 4.6 5.0 4.6  CL 114* 110  --  107 105 110  CO2 21* 25 17* 21 22 21*  GLUCOSE 102* 98 105 152* 97 112*  BUN 10 13 42.8* 35* 32* 26*  CALCIUM 8.0* 8.2* 9.4 9.2 8.8* 8.1*  CREATININE 1.07 1.20 1.5* 1.73* 1.72* 1.49*  GFRNONAA >60 >60  --   --  41* 49*  GFRAA >60 >60  --   --  47* 56*    LIVER FUNCTION TESTS:  Recent Labs  09/27/16 1823 10/08/16 0442 11/11/16 0913 01/23/17 1345  BILITOT 0.6 0.5 <0.22 0.4  AST 20 13* 14 26  ALT 27 12* 18 26  ALKPHOS 70 50 63 70  PROT 7.9 5.7* 7.7 8.0  ALBUMIN 3.5 2.3* 3.1* 3.1*    TUMOR MARKERS: No results for input(s): AFPTM, CEA, CA199, CHROMGRNA in the last 8760 hours.  Assessment and Plan: Metastatic RCC Diverticulitis with focal LLQ abscess Dr. Earleen Newport reviewed, feels amenable to perc drainage. Plavix being held, but expect low risk for bleed given location. Discussed procedure with pt including risks, complications but will hold off on consent signature until wife present.  Thank you for this interesting consult.  I  greatly enjoyed meeting Austin [name] and look forward to participating in their care.  A copy of this report was sent to the requesting provider on this date.  Electronically Signed: Ascencion Dike, PA-C 01/24/2017, 12:30 PM   I spent a total of 20 minutes in face to face in clinical consultation, greater than 50% of which was counseling/coordinating care for diverticular abscess drain

## 2017-01-25 LAB — CBC
HEMATOCRIT: 35.4 % — AB (ref 39.0–52.0)
Hemoglobin: 11.3 g/dL — ABNORMAL LOW (ref 13.0–17.0)
MCH: 27.3 pg (ref 26.0–34.0)
MCHC: 31.9 g/dL (ref 30.0–36.0)
MCV: 85.5 fL (ref 78.0–100.0)
Platelets: 227 10*3/uL (ref 150–400)
RBC: 4.14 MIL/uL — ABNORMAL LOW (ref 4.22–5.81)
RDW: 16.8 % — ABNORMAL HIGH (ref 11.5–15.5)
WBC: 9.1 10*3/uL (ref 4.0–10.5)

## 2017-01-25 LAB — BASIC METABOLIC PANEL
ANION GAP: 8 (ref 5–15)
BUN: 23 mg/dL — ABNORMAL HIGH (ref 6–20)
CALCIUM: 8 mg/dL — AB (ref 8.9–10.3)
CO2: 16 mmol/L — AB (ref 22–32)
Chloride: 114 mmol/L — ABNORMAL HIGH (ref 101–111)
Creatinine, Ser: 1.39 mg/dL — ABNORMAL HIGH (ref 0.61–1.24)
GFR calc Af Amer: >60 mL/min (ref >60)
GFR calc non Af Amer: 53 mL/min — ABNORMAL LOW (ref >60)
GLUCOSE: 71 mg/dL (ref 65–99)
Potassium: 4.6 mmol/L (ref 3.5–5.1)
Sodium: 138 mmol/L (ref 135–145)

## 2017-01-25 LAB — CLOSTRIDIUM DIFFICILE BY PCR: CDIFFPCR: POSITIVE — AB

## 2017-01-25 LAB — C DIFFICILE QUICK SCREEN W PCR REFLEX
C Diff antigen: POSITIVE — AB
C Diff toxin: NEGATIVE

## 2017-01-25 MED ORDER — MORPHINE SULFATE (PF) 4 MG/ML IV SOLN: 1.0000 mg | INTRAVENOUS | Status: DC | PRN

## 2017-01-25 MED ORDER — SODIUM CHLORIDE 0.9 % IV SOLN
1.0000 g | INTRAVENOUS | Status: DC
  Administered 2017-01-25 – 2017-01-28 (×4): 1 g via INTRAVENOUS
  Filled 2017-01-25 (×5): qty 1

## 2017-01-25 NOTE — Progress Notes (Signed)
[facility]  PROGRESS NOTE  Austin [name] ZLD:[id number] DOB: 05/13/1956 DOA: 01/23/2017 PCP: [name], MD   Brief HPI:     61 y.o. 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 Located in the left lower side. 8 out of 10 in intensity, sharp. No radiation. Some nausea but no vomiting. No discomfort with urination. Normal bowel movement yesterday. Patient and wife very frustrated that this has recurred. No diarrhea. Patient was seen by his gastroenterologist recently and a CT scan was ordered for today which shows inflammatory changes with possible abscess formation. He will be hospitalized for further management.    Subjective   Patient seen and examined, having loose bowel movements.   4 loose bowel movements since admission   Assessment/Plan:     1. Intra-abdominal abscess,secondary to diverticulitis- Gen. surgery following, patientWas started on Cipro, Flagyl and gentamicin. IR consulted for drainage of abscess.WBC is 10.5. after discussing with ID Dr.Comer, will switch IV antibiotics to IV ertapenem. As these antibiotics could have been contributing to patient's diarrhea. 2. Diarrhea- C. difficile antigen positive, C. difficile toxin is negative. Called and discussed with Dr Linus Salmons, who recommends no treatment for now as it could be colonization. Will switch IV antibiotics to ertapenem. We will hold Imodium at this time. 3. Acute kidney injury- patient was injected with creatinine of 1.7 to admission, improved to 1.39 with IV fluids. Patient's baseline creatinine is around 1. Follow BMP in a.m. 4.  Hypothyroidism- patient's medication list showed Synthroid and was started by admitting physician. As per nursing staff patient's wife said he was recently taken off Synthroid as it  Was causing muscle spasm. As per note dated 01/20/2017 from [facility], Dr.  [name] held levothyroxine for a week. She'll be restarted on 01/28/2017. 5. 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 6. History of essential hypertension- blood pressure stable. 7. History of metastatic renal cell cancer- followed by oncology as outpatient  DVT prophylaxis: SCDs  Code Status: Full code  Family Communication: No family present at bedside   Disposition Plan: Pending improvement in diverticulitis   Consultants:  Gen. surgery  Procedures:  None   Continuous infusions . sodium chloride 75 mL/hr at 01/25/17 0129  . ertapenem        Antibiotics:   Anti-infectives    Start     Dose/Rate Route Frequency Ordered Stop   01/25/17 1600  gentamicin (GARAMYCIN) 120 mg in dextrose 5 % 50 mL IVPB  Status:  Discontinued     120 mg 106 mL/hr over 30 Minutes Intravenous Every 12 hours 01/24/17 0624 01/25/17 1132   01/25/17 1400  ertapenem (INVANZ) 1 g in sodium chloride 0.9 % 50 mL IVPB     1 g 100 mL/hr over 30 Minutes Intravenous Every 24 hours 01/25/17 1132     01/24/17 0200  ciprofloxacin (CIPRO) IVPB 400 mg  Status:  Discontinued     400 mg 200 mL/hr over 60 Minutes Intravenous Every 12 hours 01/23/17 2205 01/25/17 1132   01/23/17 2200  metroNIDAZOLE (FLAGYL) IVPB 500 mg  Status:  Discontinued     500 mg 100 mL/hr over 60 Minutes Intravenous Every 8 hours 01/23/17 2158 01/25/17 1132   01/23/17 1600  gentamicin (GARAMYCIN) 540 mg in dextrose 5 % 100 mL IVPB  Status:  Discontinued     540 mg 113.5 mL/hr over  60 Minutes Intravenous Every 24 hours 01/23/17 1444 01/24/17 0623   01/23/17 1430  ciprofloxacin (CIPRO) IVPB 400 mg     400 mg 200 mL/hr over 60 Minutes Intravenous  Once 01/23/17 1351 01/23/17 1510   01/23/17 1430  metroNIDAZOLE (FLAGYL) IVPB 500 mg     500 mg 100 mL/hr over 60 Minutes Intravenous  Once 01/23/17 1351 01/23/17 1611       Objective   Vitals:   01/24/17 0422 01/24/17 1411  01/24/17 2012 01/25/17 0509  BP: (!) 145/70 (!) 119/59 126/68 122/65  Pulse: 70 64 73 75  Resp: 17 18 18 16   Temp: 97.7 F (36.5 C) 98.2 F (36.8 C) 97.1 F (36.2 C) 98 F (36.7 C)  TempSrc: Oral Oral Oral Oral  SpO2: 99% 96% 97% 99%  Weight:      Height:        Intake/Output Summary (Last 24 hours) at 01/25/17 1335 Last data filed at 01/25/17 0900  Gross per 24 hour  Intake             2655 ml  Output              875 ml  Net             1780 ml   Filed Weights   01/23/17 1409 01/23/17 2155  Weight: 78.5 kg (173 lb) 78.5 kg (173 lb 1 oz)     Physical Examination:  Physical Exam: Eyes: No icterus, extraocular muscles intact  Mouth: Oral mucosa is moist, no lesions on palate,  Neck: Supple, no deformities, masses, or tenderness Lungs: Normal respiratory effort, bilateral clear to auscultation, no crackles or wheezes.  Heart: Regular rate and rhythm, S1 and S2 normal, no murmurs, rubs auscultated Abdomen: BS normoactive,soft,nondistended, tender to palpation in left lower quadrant,no organomegaly Extremities: No pretibial edema, no erythema, no cyanosis, no clubbing Neuro : Alert and oriented to time, place and person, quadriplegia   Skin: No rashes seen on exam     Data Reviewed: I have personally reviewed following labs and imaging studies  CBG: No results for input(s): GLUCAP in the last 168 hours.  CBC:  Recent Labs Lab 01/23/17 1345 01/24/17 0259 01/25/17 0715  WBC 12.0* 10.5 9.1  HGB 12.0* 11.0* 11.3*  HCT 37.0* 34.4* 35.4*  MCV 85.6 85.4 85.5  PLT 235 207 712    Basic Metabolic Panel:  Recent Labs Lab 01/23/17 0918 01/23/17 1345 01/24/17 0259 01/25/17 0715  NA 137 136 137 138  K 4.6 5.0 4.6 4.6  CL 107 105 110 114*  CO2 21 22 21* 16*  GLUCOSE 152* 97 112* 71  BUN 35* 32* 26* 23*  CREATININE 1.73* 1.72* 1.49* 1.39*  CALCIUM 9.2 8.8* 8.1* 8.0*    Recent Results (from the past 240 hour(s))  Blood culture (routine x 2)     Status:  None (Preliminary result)   Collection Time: 01/23/17  1:43 PM  Result Value Ref Range Status   Specimen Description BLOOD LEFT HAND  Final   Special Requests IN PEDIATRIC BOTTLE Blood Culture adequate volume  Final   Culture   Final    NO GROWTH 2 DAYS Performed at [facility], [address], [place], [place] [address]    Report Status PENDING  Incomplete  Blood culture (routine x 2)     Status: None (Preliminary result)   Collection Time: 01/23/17  1:45 PM  Result Value Ref Range Status   Specimen Description BLOOD RIGHT  HAND  Final   Special Requests   Final    BOTTLES DRAWN AEROBIC AND ANAEROBIC Blood Culture adequate volume   Culture   Final    NO GROWTH 2 DAYS Performed at [facility], [address], [place], [place] [address]    Report Status PENDING  Incomplete  C difficile quick scan w PCR reflex     Status: Abnormal   Collection Time: 01/25/17  9:10 AM  Result Value Ref Range Status   C Diff antigen POSITIVE (A) NEGATIVE Final    Comment: RESULT CALLED TO, READ BACK BY AND VERIFIED WITH: CAUDLE,E AT 1100 ON [id number] BY HOOKER,B    C Diff toxin NEGATIVE NEGATIVE Final   C Diff interpretation Results are indeterminate. See PCR results.  Final     Liver Function Tests:  Recent Labs Lab 01/23/17 1345  AST 26  ALT 26  ALKPHOS 70  BILITOT 0.4  PROT 8.0  ALBUMIN 3.1*    Recent Labs Lab 01/23/17 1345  LIPASE 43   No results for input(s): AMMONIA in the last 168 hours.    Studies: No results found.  Scheduled Meds: . escitalopram  10 mg Oral Daily  . traZODone  100 mg Oral QHS      Time spent: 25 min  [facility] Pager [phone]. If 7PM-7AM, please contact night-coverage at [email or web address], Office  [phone]  password [email or web address] 01/25/2017, 1:35 PM  LOS: 2 days

## 2017-01-25 NOTE — Progress Notes (Signed)
[facility]:  [phone] General Surgery Progress Note   LOS: 2 days  POD -     Chief Complaint: Abdominal pain  Assessment and Plan: 1.  Diverticulitis with focal abscess  Cipro/Flagyl/gentamicin - 6/15 >>>  WBC - 9,100 - 01/24/2017  Mildly sore in LLQ.  The patient has limited verbal skills, which makes communication difficult.  Wonder whether palliative care ought to be involved - surgery on him would be high risk, would almost certainly require a colostomy.  Will try IR drainage of abscess.  Dr Lucia Gaskins discussed this with Dr. Earleen Newport. To stop Plavix.   2.  Recurrent Metastatic renal cell cancer, s/p left radical nephrectomy 9/132013, peritoneal metastasis 2015  Followed by Dr. Osker Mason 3.  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  History of C. Diff, repeat test pending due to diarrhea 5.  DVT prophylaxis - on hold 6.  Creatinine - 1.39 - 01/25/2017 7.  On Plavix - last dose 6/15. 8.  Sleep apnea 9.  DM   Principal Problem:   Intra-abdominal abscess ([place]) Active Problems:   Renal carcinoma metastatic with carcinomatosis   Quadriplegia from brainstem stroke   Subjective:  Doing okay.    Objective:   Vitals:   01/24/17 2012 01/25/17 0509  BP: 126/68 122/65  Pulse: 73 75  Resp: 18 16  Temp: 97.1 F (36.2 C) 98 F (36.7 C)     Intake/Output from previous day:  06/16 0701 - 06/17 0700 In: 2655 [I.V.:2355; IV Piggyback:300] Out: 875 [Urine:875]  Intake/Output this shift:  No intake/output data recorded.   Physical Exam:   General: WM who is alert and oriented. But his speech is hard to follow.   HEENT: Normal. Pupils equal. .   Lungs: Clear   Abdomen: Soft.  Has abdominal wall hernia. Some tenderness in LLQ   Neurologic:  Contractured left hand/arm, limited movement of right hand   Lab Results:     Recent Labs  01/24/17 0259 01/25/17 0715  WBC 10.5 9.1  HGB 11.0* 11.3*  HCT 34.4* 35.4*  PLT 207 227    BMET    Recent  Labs  01/24/17 0259 01/25/17 0715  NA 137 138  K 4.6 4.6  CL 110 114*  CO2 21* 16*  GLUCOSE 112* 71  BUN 26* 23*  CREATININE 1.49* 1.39*  CALCIUM 8.1* 8.0*    PT/INR  No results for input(s): LABPROT, INR in the last 72 hours.  ABG  No results for input(s): PHART, HCO3 in the last 72 hours.  Invalid input(s): PCO2, PO2   Studies/Results:  Ct Abdomen Pelvis W Contrast  Result Date: 01/23/2017 CLINICAL DATA:  Left lower quadrant abdominal pain for 1 week. Recent perforated sigmoid diverticulitis requiring percutaneous CT-guided drainage in February 2018. History of metastatic renal cell carcinoma. EXAM: CT ABDOMEN AND PELVIS WITH CONTRAST TECHNIQUE: Multidetector CT imaging of the abdomen and pelvis was performed using the standard protocol following bolus administration of intravenous contrast. CONTRAST:  61mL ISOVUE-300 IOPAMIDOL (ISOVUE-300) INJECTION 61% COMPARISON:  10/08/2016 CT abdomen/ pelvis. FINDINGS: Lower chest: Enlarging 1.1 cm basilar left lower lobe solid pulmonary nodule (series 4/ image 11), previously 0.8 cm. New 0.5 cm left lower lobe solid pulmonary nodule (series 4/ image 6). Enlarging 0.7 cm anterior right lower lobe solid pulmonary nodule (series 4/ image 3), previously 0.4 cm. Hepatobiliary: Normal liver with no liver mass. Cholecystectomy. No biliary ductal dilatation. Pancreas: Normal, with no mass or duct dilation. Spleen: Solitary hypodense 1.5 x 3.9 cm  splenic mass (series 3/ image 21), previously 4.6 x 4.3 cm, not appreciably changed. Overall normal size spleen. Adrenals/Urinary Tract: Left adrenal 1.8 x 1.5 cm mass (series 3/ image 31), previously 1.8 x 1.5 cm, stable. Hyperdense 0.8 cm right adrenal nodule (series 3/ image 25), stable. Status post left nephrectomy. Stable fat necrosis in the left nephrectomy bed. No additional mass or fluid collection in the left nephrectomy bed. No right hydronephrosis. No right renal mass. Normal bladder. Stomach/Bowel: Grossly  normal stomach. Normal caliber small bowel with no small bowel wall thickening. Normal appendix. Mild sigmoid diverticulosis. Pericolonic gas containing 3.9 x 2.7 cm abscess adjacent to the sigmoid colon (series 3/ image 73), increased from 2.8 x 1.8 cm on 10/08/2016. Mild adjacent sigmoid colon wall thickening and pericolonic fat stranding. No large bowel dilatation. Vascular/Lymphatic: Atherosclerotic nonaneurysmal abdominal aorta. Patent portal, splenic, hepatic and right renal veins. No pathologically enlarged lymph nodes in the abdomen or pelvis. Reproductive: Top-normal size prostate with nonspecific internal prostatic calcifications. Other: No additional abdominopelvic fluid collections. Stable mild to moderate gynecomastia, asymmetric to the right. Hyperenhancing 1.4 cm peritoneal implant inferior to the right liver lobe (series 3/image 30), increased from 0.6 cm. Irregular hyperenhancing 6.6 x 4.9 cm right paracolic gutter peritoneal implant (series 3/image 56), increased from 5.7 x 4.7 cm. Musculoskeletal: No aggressive appearing focal osseous lesions. Moderate thoracolumbar spondylosis. IMPRESSION: 1. Pericolonic left lower quadrant 3.9 x 2.7 cm gas containing abscess adjacent to the sigmoid colon, increased in size since 10/08/2016 CT study. 2. Progression of pulmonary metastases at the lung bases . 3. Progression of scattered peritoneal metastases. No evidence of bowel obstruction. 4. Stable splenic and bilateral adrenal metastases. These results will be called to the ordering clinician or representative by the Radiology Department at the imaging location. Electronically Signed   By: Ilona Sorrel M.D.   On: 01/23/2017 11:09     Anti-infectives:   Anti-infectives    Start     Dose/Rate Route Frequency Ordered Stop   01/25/17 1600  gentamicin (GARAMYCIN) 120 mg in dextrose 5 % 50 mL IVPB     120 mg 106 mL/hr over 30 Minutes Intravenous Every 12 hours 01/24/17 0624     01/24/17 0200   ciprofloxacin (CIPRO) IVPB 400 mg     400 mg 200 mL/hr over 60 Minutes Intravenous Every 12 hours 01/23/17 2205     01/23/17 2200  metroNIDAZOLE (FLAGYL) IVPB 500 mg     500 mg 100 mL/hr over 60 Minutes Intravenous Every 8 hours 01/23/17 2158     01/23/17 1600  gentamicin (GARAMYCIN) 540 mg in dextrose 5 % 100 mL IVPB  Status:  Discontinued     540 mg 113.5 mL/hr over 60 Minutes Intravenous Every 24 hours 01/23/17 1444 01/24/17 0623   01/23/17 1430  ciprofloxacin (CIPRO) IVPB 400 mg     400 mg 200 mL/hr over 60 Minutes Intravenous  Once 01/23/17 1351 01/23/17 1510   01/23/17 1430  metroNIDAZOLE (FLAGYL) IVPB 500 mg     500 mg 100 mL/hr over 60 Minutes Intravenous  Once 01/23/17 1351 16/10/96 0454      Henok Heacock C Renella Steig, MD  [facility]

## 2017-01-25 NOTE — Progress Notes (Signed)
CRITICAL VALUE ALERT  Critical Value:  + Cdiff antigen - Cdiff Toxin  Date & Time Notied: 01/25/2017 1102   Provider Notified: Darrick Meigs   Orders Received/Actions taken: continue enteric precautions  Holt Woolbright, Barbee Shropshire, RN

## 2017-01-26 ENCOUNTER — Inpatient Hospital Stay (HOSPITAL_COMMUNITY): Payer: PPO

## 2017-01-26 ENCOUNTER — Encounter (HOSPITAL_COMMUNITY): Payer: Self-pay | Admitting: Radiology

## 2017-01-26 DIAGNOSIS — T81321A Disruption or dehiscence of closure of internal operation (surgical) wound of abdominal wall muscle or fascia, initial encounter: Secondary | ICD-10-CM

## 2017-01-26 DIAGNOSIS — T8131XA Disruption of external operation (surgical) wound, not elsewhere classified, initial encounter: Secondary | ICD-10-CM

## 2017-01-26 HISTORY — DX: Disruption of external operation (surgical) wound, not elsewhere classified, initial encounter: T81.31XA

## 2017-01-26 HISTORY — DX: Disruption or dehiscence of closure of internal operation (surgical) wound of abdominal wall muscle or fascia, initial encounter: T81.321A

## 2017-01-26 LAB — CBC
HCT: 36.1 % — ABNORMAL LOW (ref 39.0–52.0)
Hemoglobin: 11.5 g/dL — ABNORMAL LOW (ref 13.0–17.0)
MCH: 27.1 pg (ref 26.0–34.0)
MCHC: 31.9 g/dL (ref 30.0–36.0)
MCV: 85.1 fL (ref 78.0–100.0)
PLATELETS: 238 10*3/uL (ref 150–400)
RBC: 4.24 MIL/uL (ref 4.22–5.81)
RDW: 16.7 % — ABNORMAL HIGH (ref 11.5–15.5)
WBC: 8.9 10*3/uL (ref 4.0–10.5)

## 2017-01-26 LAB — BASIC METABOLIC PANEL
ANION GAP: 6 (ref 5–15)
BUN: 17 mg/dL (ref 6–20)
CALCIUM: 8.2 mg/dL — AB (ref 8.9–10.3)
CO2: 18 mmol/L — ABNORMAL LOW (ref 22–32)
Chloride: 114 mmol/L — ABNORMAL HIGH (ref 101–111)
Creatinine, Ser: 1.32 mg/dL — ABNORMAL HIGH (ref 0.61–1.24)
GFR, EST NON AFRICAN AMERICAN: 56 mL/min — AB (ref >60)
Glucose, Bld: 82 mg/dL (ref 65–99)
POTASSIUM: 4.6 mmol/L (ref 3.5–5.1)
Sodium: 138 mmol/L (ref 135–145)

## 2017-01-26 MED ORDER — FLUMAZENIL 0.5 MG/5ML IV SOLN
INTRAVENOUS | Status: AC
  Filled 2017-01-26: qty 5

## 2017-01-26 MED ORDER — SODIUM CHLORIDE 0.45 % IV SOLN: INTRAVENOUS | Status: DC

## 2017-01-26 MED ORDER — MIDAZOLAM HCL 2 MG/2ML IJ SOLN
INTRAMUSCULAR | Status: AC | PRN
  Administered 2017-01-26 (×2): 1 mg via INTRAVENOUS

## 2017-01-26 MED ORDER — FENTANYL CITRATE (PF) 100 MCG/2ML IJ SOLN
INTRAMUSCULAR | Status: AC
  Filled 2017-01-26: qty 4

## 2017-01-26 MED ORDER — MIDAZOLAM HCL 2 MG/2ML IJ SOLN
INTRAMUSCULAR | Status: AC
  Filled 2017-01-26: qty 6

## 2017-01-26 MED ORDER — VANCOMYCIN 50 MG/ML ORAL SOLUTION
125.0000 mg | Freq: Four times a day (QID) | ORAL | Status: DC
  Administered 2017-01-26 – 2017-01-30 (×16): 125 mg via ORAL
  Filled 2017-01-26 (×19): qty 2.5

## 2017-01-26 MED ORDER — LIDOCAINE HCL (PF) 1 % IJ SOLN
INTRAMUSCULAR | Status: AC | PRN
  Administered 2017-01-26: 10 mL via INTRADERMAL

## 2017-01-26 MED ORDER — NALOXONE HCL 0.4 MG/ML IJ SOLN
INTRAMUSCULAR | Status: AC
  Filled 2017-01-26: qty 1

## 2017-01-26 MED ORDER — RIFAXIMIN 200 MG PO TABS: 400.0000 mg | ORAL_TABLET | Freq: Three times a day (TID) | ORAL | Status: DC

## 2017-01-26 MED ORDER — SODIUM CHLORIDE 0.9 % IV SOLN
INTRAVENOUS | Status: AC
  Filled 2017-01-26: qty 250

## 2017-01-26 MED ORDER — FENTANYL CITRATE (PF) 100 MCG/2ML IJ SOLN
INTRAMUSCULAR | Status: AC | PRN
  Administered 2017-01-26 (×2): 50 ug via INTRAVENOUS

## 2017-01-26 MED ORDER — SODIUM CHLORIDE 0.45 % IV SOLN
INTRAVENOUS | Status: DC
  Administered 2017-01-26 – 2017-01-27 (×2): via INTRAVENOUS
  Administered 2017-01-27: 1000 mL via INTRAVENOUS
  Administered 2017-01-28 (×2): via INTRAVENOUS

## 2017-01-26 NOTE — Progress Notes (Addendum)
[facility]  PROGRESS NOTE  Austin [name] BHA:[id number] DOB: 1956/07/15 DOA: 01/23/2017 PCP: [name], MD   Brief HPI:     61 y.o. 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 Located in the left lower side. 8 out of 10 in intensity, sharp. No radiation. Some nausea but no vomiting. No discomfort with urination. Normal bowel movement yesterday. Patient and wife very frustrated that this has recurred. No diarrhea. Patient was seen by his gastroenterologist recently and a CT scan was ordered for today which shows inflammatory changes with possible abscess formation. He will be hospitalized for further management.    Subjective   Patient seen and examined, having loose bowel movements.   4 loose bowel movements since admission   Assessment/Plan:     1. Intra-abdominal abscess,secondary to diverticulitis- Gen. surgery following, patientWas started on Cipro, Flagyl and gentamicin. Which was changed to IV ertapenem after discussion with ID. Patient underwent CT-guided aspiration of left lower quadrant abscess with drain catheter placement per IR.  2. C. difficile colitis- C. difficile antigen positive, C. difficile toxin is negative. Toxigenic C. difficile came back positive. We'll start patient on vancomycin for 14 days followed by rifaximin for recurrent C. difficile infection. Also start Florastor 250 mg twice a day 3. Acute kidney injury- patient was injected with creatinine of 1.7 to admission, improved to 1.32 with IV fluids. Patient's baseline creatinine is around 1. Follow BMP in a.m. 4.  Hypothyroidism- patient's medication list showed Synthroid and was started by admitting physician. As per nursing staff patient's wife said he was recently taken off Synthroid as it  Was causing muscle spasm. As per note dated 01/20/2017 from [place], Dr. [name] held  levothyroxine for a week. He'll be restarted on 01/28/2017. 5. Hyperchloremic metabolic acidosis- chloride no elevated to 114, CO2 18. Will change IV fluids to half-normal saline at 75 per hour. Follow BMP in a.m. 6. 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 7. History of essential hypertension- blood pressure stable. 8. History of metastatic renal cell cancer- followed by oncology as outpatient  DVT prophylaxis: SCDs  Code Status: Full code  Family Communication: Discussed with wife at bedside  Disposition Plan: Pending improvement in diverticulitis   Consultants:  Gen. surgery  Procedures:  None   Continuous infusions . sodium chloride 75 mL/hr at 01/26/17 0400  . ertapenem Stopped (01/25/17 1418)  . sodium chloride        Antibiotics:   Anti-infectives    Start     Dose/Rate Route Frequency Ordered Stop   02/09/17 1100  rifaximin (XIFAXAN) tablet 400 mg     400 mg Oral 3 times daily 01/26/17 1006 02/23/17 0959   01/26/17 1100  vancomycin (VANCOCIN) 50 mg/mL oral solution 125 mg     125 mg Oral 4 times daily 01/26/17 1006 02/09/17 0959   01/25/17 1600  gentamicin (GARAMYCIN) 120 mg in dextrose 5 % 50 mL IVPB  Status:  Discontinued     120 mg 106 mL/hr over 30 Minutes Intravenous Every 12 hours 01/24/17 0624 01/25/17 1132   01/25/17 1400  ertapenem (INVANZ) 1 g in sodium chloride 0.9 % 50 mL IVPB     1 g 100 mL/hr over 30 Minutes Intravenous Every 24 hours 01/25/17 1132     01/24/17 0200  ciprofloxacin (CIPRO) IVPB 400 mg  Status:  Discontinued  400 mg 200 mL/hr over 60 Minutes Intravenous Every 12 hours 01/23/17 2205 01/25/17 1132   01/23/17 2200  metroNIDAZOLE (FLAGYL) IVPB 500 mg  Status:  Discontinued     500 mg 100 mL/hr over 60 Minutes Intravenous Every 8 hours 01/23/17 2158 01/25/17 1132   01/23/17 1600  gentamicin (GARAMYCIN) 540 mg in dextrose 5 % 100 mL IVPB  Status:  Discontinued     540  mg 113.5 mL/hr over 60 Minutes Intravenous Every 24 hours 01/23/17 1444 01/24/17 0623   01/23/17 1430  ciprofloxacin (CIPRO) IVPB 400 mg     400 mg 200 mL/hr over 60 Minutes Intravenous  Once 01/23/17 1351 01/23/17 1510   01/23/17 1430  metroNIDAZOLE (FLAGYL) IVPB 500 mg     500 mg 100 mL/hr over 60 Minutes Intravenous  Once 01/23/17 1351 01/23/17 1611       Objective   Vitals:   01/26/17 0914 01/26/17 0920 01/26/17 0925 01/26/17 0931  BP: (!) 152/87 132/77 121/69 116/74  Pulse: 72 71 75 70  Resp: 10 10 10  (!) 8  Temp:      TempSrc:      SpO2: 100% 99% 98% 98%  Weight:      Height:        Intake/Output Summary (Last 24 hours) at 01/26/17 1259 Last data filed at 01/26/17 0700  Gross per 24 hour  Intake          2118.75 ml  Output              500 ml  Net          1618.75 ml   Filed Weights   01/23/17 1409 01/23/17 2155  Weight: 78.5 kg (173 lb) 78.5 kg (173 lb 1 oz)     Physical Examination:  Physical Exam: Eyes: No icterus, extraocular muscles intact  Mouth: Oral mucosa is moist, no lesions on palate,  Neck: Supple, no deformities, masses, or tenderness Lungs: Normal respiratory effort, bilateral clear to auscultation, no crackles or wheezes.  Heart: Regular rate and rhythm, S1 and S2 normal, no murmurs, rubs auscultated Abdomen: BS normoactive,soft,nondistended,-tender to palpation in left lower quadrant,no organomegaly Extremities: No pretibial edema, no erythema, no cyanosis, no clubbing Neuro : Alert and oriented to time, place and person, quadriplegia  Skin: No rashes seen on exam     Data Reviewed: I have personally reviewed following labs and imaging studies  CBG: No results for input(s): GLUCAP in the last 168 hours.  CBC:  Recent Labs Lab 01/23/17 1345 01/24/17 0259 01/25/17 0715 01/26/17 0548  WBC 12.0* 10.5 9.1 8.9  HGB 12.0* 11.0* 11.3* 11.5*  HCT 37.0* 34.4* 35.4* 36.1*  MCV 85.6 85.4 85.5 85.1  PLT 235 207 227 238    Basic  Metabolic Panel:  Recent Labs Lab 01/23/17 0918 01/23/17 1345 01/24/17 0259 01/25/17 0715 01/26/17 0548  NA 137 136 137 138 138  K 4.6 5.0 4.6 4.6 4.6  CL 107 105 110 114* 114*  CO2 21 22 21* 16* 18*  GLUCOSE 152* 97 112* 71 82  BUN 35* 32* 26* 23* 17  CREATININE 1.73* 1.72* 1.49* 1.39* 1.32*  CALCIUM 9.2 8.8* 8.1* 8.0* 8.2*    Recent Results (from the past 240 hour(s))  Blood culture (routine x 2)     Status: None (Preliminary result)   Collection Time: 01/23/17  1:43 PM  Result Value Ref Range Status   Specimen Description BLOOD LEFT HAND  Final   Special Requests IN PEDIATRIC BOTTLE  Blood Culture adequate volume  Final   Culture   Final    NO GROWTH 2 DAYS Performed at [facility], [place] [address] [place], [place] [address]    Report Status PENDING  Incomplete  Blood culture (routine x 2)     Status: None (Preliminary result)   Collection Time: 01/23/17  1:45 PM  Result Value Ref Range Status   Specimen Description BLOOD RIGHT HAND  Final   Special Requests   Final    BOTTLES DRAWN AEROBIC AND ANAEROBIC Blood Culture adequate volume   Culture   Final    NO GROWTH 2 DAYS Performed at [facility], [place] [address], [place], [place] [address]    Report Status PENDING  Incomplete  C difficile quick scan w PCR reflex     Status: Abnormal   Collection Time: 01/25/17  9:10 AM  Result Value Ref Range Status   C Diff antigen POSITIVE (A) NEGATIVE Final    Comment: RESULT CALLED TO, READ BACK BY AND VERIFIED WITH: CAUDLE,E AT 1100 ON [id number] BY HOOKER,B    C Diff toxin NEGATIVE NEGATIVE Final   C Diff interpretation Results are indeterminate. See PCR results.  Final  Clostridium Difficile by PCR     Status: Abnormal   Collection Time: 01/25/17  9:10 AM  Result Value Ref Range Status   Toxigenic C Difficile by pcr POSITIVE (A) NEGATIVE Final    Comment: Positive for toxigenic C. difficile with little to no toxin production. Only treat if clinical presentation  suggests symptomatic illness. Performed at [facility], [place] [address], [place], [place] [address]      Liver Function Tests:  Recent Labs Lab 01/23/17 1345  AST 26  ALT 26  ALKPHOS 70  BILITOT 0.4  PROT 8.0  ALBUMIN 3.1*    Recent Labs Lab 01/23/17 1345  LIPASE 43   No results for input(s): AMMONIA in the last 168 hours.    Studies: Ct Image Guided Fluid Drain By Catheter  Result Date: 01/26/2017 CLINICAL DATA:  Recurrent Diverticular abscess. Previous percutaneous aspiration of similar lesion 10/03/2016. History of metastatic renal cell carcinoma. EXAM: CT GUIDED DRAINAGE OF LEFT LOWER QUADRANT ABSCESS ANESTHESIA/SEDATION: Intravenous Fentanyl and Versed were administered as conscious sedation during continuous monitoring of the patient's level of consciousness and physiological / cardiorespiratory status by the radiology RN, with a total moderate sedation time of 21 minutes. PROCEDURE: The procedure, risks, benefits, and alternatives were explained to the patient. Questions regarding the procedure were encouraged and answered. The patient understands and consents to the procedure. Select axial scans through the lower abdomen were obtained. The collection was localized and an appropriate skin entry site was determined and marked. The operative field was prepped with chlorhexidinein a sterile fashion, and a sterile drape was applied covering the operative field. A sterile gown and sterile gloves were used for the procedure. Local anesthesia was provided with 1% Lidocaine. Under CT fluoroscopic guidance, a 19 gauge percutaneous entry needle was advanced into the collection. A a Amplatz guidewire advanced into the collection, confirmed on CT. Tract dilated to facilitate placement of a 12 French pigtail drain catheter, formed within the collection. Catheter secured externally with 0 Prolene suture and StatLock and placed to gravity drainage. A sample of the aspirate sent for Gram  stain and culture. The patient tolerated the procedure well. COMPLICATIONS: None immediate FINDINGS: Complex gas and fluid collection adjacent to the distal descending/sigmoid junction was again localized. Right and  mid peritoneal masses were again noted, as seen on recent CT. Percutaneous drain catheter placed as above. Sample of the aspirate sent for Gram stain and culture. IMPRESSION: 1. Technically successful CT-guided left lower quadrant percutaneous abscess drain catheter placement. Electronically Signed   By: Lucrezia Europe M.D.   On: 01/26/2017 10:14    Scheduled Meds: . escitalopram  10 mg Oral Daily  . fentaNYL      . midazolam      . vancomycin  125 mg Oral QID   Followed by  . [START ON 02/09/2017] rifaximin  400 mg Oral TID  . traZODone  100 mg Oral QHS      Time spent: 25 min  [facility] Pager [phone]. If 7PM-7AM, please contact night-coverage at [email or web address], Office  [phone]  password [email or web address] 01/26/2017, 12:59 PM  LOS: 3 days

## 2017-01-26 NOTE — Care Management Note (Signed)
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 Located in the left lower side. 8 out of 10 in intensity, sharp. No radiation. Some nausea but no vomiting. No discomfort with urination. Normal bowel movement yesterday. Patient and wife very frustrated that this has recurred. No diarrhea. Patient was seen by his gastroenterologist recently and a CT scan was ordered for today which shows inflammatory changes with possible abscess formation [id number]/ CT guided LLQ perc abscess drain catheter placement Action/Plan: Date:  January 26, 2017  Chart reviewed for concurrent status and case management needs.  Will continue to follow patient progress.  Discharge Planning: following for needs  Expected discharge date: [id number]  Velva Harman, BSN, [place], [place]   Expected Discharge Date:   (unknown)               Expected Discharge Plan:  Home/Self Care  In-House Referral:     Discharge planning Services  CM Consult  Post Acute Care Choice:    Choice offered to:     DME Arranged:    DME Agency:     HH Arranged:    HH Agency:     Status of Service:  In process, will continue to follow  If discussed at Long Length of Stay Meetings, dates discussed:    Additional Comments:  Leeroy Cha, RN 01/26/2017, 10:06 AM

## 2017-01-26 NOTE — Progress Notes (Signed)
[facility] Progress Note     Subjective: CC: Diarrhea Patient having diarrhea, + for C.Diff yesterday. Patient really wants to eat some yogurt. Pain in LLQ is only occasional and not severe when present. Denies n/v, increased pain with drinking.  Drain placed this AM.   Objective: Vital signs in last 24 hours: Temp:  [97.6 F (36.4 C)-99.4 F (37.4 C)] 97.6 F (36.4 C) (06/18 0430) Pulse Rate:  [70-81] 70 (06/18 0931) Resp:  [8-18] 8 (06/18 0931) BP: (116-152)/(68-87) 116/74 (06/18 0931) SpO2:  [97 %-100 %] 98 % (06/18 0931) Last BM Date: 01/25/17  Intake/Output from previous day: 06/17 0701 - 06/18 0700 In: 2118.8 [P.O.:240; I.V.:1828.8; IV Piggyback:50] Out: 500 [Urine:500] Intake/Output this shift: No intake/output data recorded.  PE: Gen:  Alert, NAD, pleasant Card:  Regular rate and rhythm, no M/G/R Pulm:  Normal effort, clear to auscultation bilaterally Abd: Soft, mildly TTP in LLQ, non-distended, bowel sounds present in all 4 quadrants. LLQ drain with small amt serosanguinous fluid in tubing.  Skin: warm and dry, no rashes  Psych: A&Ox3   Lab Results:   Recent Labs  01/25/17 0715 01/26/17 0548  WBC 9.1 8.9  HGB 11.3* 11.5*  HCT 35.4* 36.1*  PLT 227 238   BMET  Recent Labs  01/25/17 0715 01/26/17 0548  NA 138 138  K 4.6 4.6  CL 114* 114*  CO2 16* 18*  GLUCOSE 71 82  BUN 23* 17  CREATININE 1.39* 1.32*  CALCIUM 8.0* 8.2*   CMP     Component Value Date/Time   NA 138 01/26/2017 0548   NA 135 (L) 11/11/2016 0913   K 4.6 01/26/2017 0548   K 4.6 11/11/2016 0913   CL 114 (H) 01/26/2017 0548   CO2 18 (L) 01/26/2017 0548   CO2 17 (L) 11/11/2016 0913   GLUCOSE 82 01/26/2017 0548   GLUCOSE 105 11/11/2016 0913   BUN 17 01/26/2017 0548   BUN 42.8 (H) 11/11/2016 0913   CREATININE 1.32 (H) 01/26/2017 0548   CREATININE 1.5 (H) 11/11/2016 0913   CALCIUM 8.2 (L) 01/26/2017 0548   CALCIUM 9.4 11/11/2016 0913   PROT 8.0 01/23/2017 1345    PROT 7.7 11/11/2016 0913   ALBUMIN 3.1 (L) 01/23/2017 1345   ALBUMIN 3.1 (L) 11/11/2016 0913   AST 26 01/23/2017 1345   AST 14 11/11/2016 0913   ALT 26 01/23/2017 1345   ALT 18 11/11/2016 0913   ALKPHOS 70 01/23/2017 1345   ALKPHOS 63 11/11/2016 0913   BILITOT 0.4 01/23/2017 1345   BILITOT <0.22 11/11/2016 0913   GFRNONAA 56 (L) 01/26/2017 0548   GFRAA >60 01/26/2017 0548   Lipase     Component Value Date/Time   LIPASE 43 01/23/2017 1345    Anti-infectives: Anti-infectives    Start     Dose/Rate Route Frequency Ordered Stop   02/09/17 1000  rifaximin (XIFAXAN) tablet 400 mg     400 mg Oral 3 times daily 01/26/17 1006 02/23/17 0959   01/26/17 1015  vancomycin (VANCOCIN) 50 mg/mL oral solution 125 mg     125 mg Oral 4 times daily 01/26/17 1006 02/09/17 0959   01/25/17 1600  gentamicin (GARAMYCIN) 120 mg in dextrose 5 % 50 mL IVPB  Status:  Discontinued     120 mg 106 mL/hr over 30 Minutes Intravenous Every 12 hours 01/24/17 0624 01/25/17 1132   01/25/17 1400  ertapenem (INVANZ) 1 g in sodium chloride 0.9 % 50 mL IVPB     1 g  100 mL/hr over 30 Minutes Intravenous Every 24 hours 01/25/17 1132     01/24/17 0200  ciprofloxacin (CIPRO) IVPB 400 mg  Status:  Discontinued     400 mg 200 mL/hr over 60 Minutes Intravenous Every 12 hours 01/23/17 2205 01/25/17 1132   01/23/17 2200  metroNIDAZOLE (FLAGYL) IVPB 500 mg  Status:  Discontinued     500 mg 100 mL/hr over 60 Minutes Intravenous Every 8 hours 01/23/17 2158 01/25/17 1132   01/23/17 1600  gentamicin (GARAMYCIN) 540 mg in dextrose 5 % 100 mL IVPB  Status:  Discontinued     540 mg 113.5 mL/hr over 60 Minutes Intravenous Every 24 hours 01/23/17 1444 01/24/17 0623   01/23/17 1430  ciprofloxacin (CIPRO) IVPB 400 mg     400 mg 200 mL/hr over 60 Minutes Intravenous  Once 01/23/17 1351 01/23/17 1510   01/23/17 1430  metroNIDAZOLE (FLAGYL) IVPB 500 mg     500 mg 100 mL/hr over 60 Minutes Intravenous  Once 01/23/17 1351 01/23/17 1611        Assessment/Plan Diverticulitis with focal abscess - Cipro/Flagyl/Gentamicin (6/15> 6/17) - changed to Ertapenem (6/17>>) - WBC 8.9, trending down. Afebrile. - IR placed percutaneous drain this AM (01/26/2017) - cx pending - keep on clears for now C.Difficile diarrhea - Positive for toxigenic C. difficile with little to no toxin production 6/17 - PO vanc (6/17>>), PO rifaximin (6/17>>) Recurrent Metastatic renal cell cancer - s/p left radical nephrectomy 04/23/2012 - peritoneal mets 2015 - followed by Dr. 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  VTE - SCDs ID- Cipro/flagyl/gentamicin (6/15>6/17). PO vanc (6/17>>), PO rifaximin (6/17>>). IV ertapenem (6/17>>)  Plan: Keep on clear liquids now, may be able to advance later today.   LOS: 3 days    [name] , [facility] 01/26/2017, 10:10 AM Pager: [phone] Consults: [phone] Mon-Fri 7:00 am-4:30 pm Sat-Sun 7:00 am-11:30 am  Agree with above. Hopefully perc drain will work.  Alphonsa Overall, MD, [facility] Pager: [phone] Office phone:  [phone]

## 2017-01-26 NOTE — Procedures (Signed)
CT guided LLQ perc abscess drain catheter placement  Scant thin cloudy fluid out, sample sent for GS  C&S  No complication No blood loss. See complete dictation in [facility].  Dillard Firebaugh MD Main # [phone] Pager  [phone]

## 2017-01-27 LAB — BASIC METABOLIC PANEL
ANION GAP: 11 (ref 5–15)
BUN: 21 mg/dL — ABNORMAL HIGH (ref 6–20)
CALCIUM: 8.3 mg/dL — AB (ref 8.9–10.3)
CHLORIDE: 113 mmol/L — AB (ref 101–111)
CO2: 13 mmol/L — AB (ref 22–32)
Creatinine, Ser: 1.26 mg/dL — ABNORMAL HIGH (ref 0.61–1.24)
GFR calc Af Amer: >60 mL/min (ref >60)
GFR calc non Af Amer: 59 mL/min — ABNORMAL LOW (ref >60)
GLUCOSE: 55 mg/dL — AB (ref 65–99)
Potassium: 4.3 mmol/L (ref 3.5–5.1)
Sodium: 137 mmol/L (ref 135–145)

## 2017-01-27 MED ORDER — SACCHAROMYCES BOULARDII 250 MG PO CAPS
250.0000 mg | ORAL_CAPSULE | Freq: Two times a day (BID) | ORAL | Status: DC
  Administered 2017-01-27 – 2017-01-30 (×6): 250 mg via ORAL
  Filled 2017-01-27 (×6): qty 1

## 2017-01-27 NOTE — Progress Notes (Signed)
General Surgery Note  CC:  diarrhea  Subjective: He is stable, says the pain is less, and wants full liquids.  Still having diarrhea, but not as bad.  Foley is in place  Objective: Vital signs in last 24 hours: Temp:  [97.7 F (36.5 C)-98.4 F (36.9 C)] 97.7 F (36.5 C) (06/19 0526) Pulse Rate:  [74-78] 76 (06/19 0526) Resp:  [18] 18 (06/19 0526) BP: (127-143)/(63-66) 127/66 (06/19 0526) SpO2:  [97 %-99 %] 98 % (06/19 0526) Last BM Date: 01/27/17 1287 IV NPO Urine 850 recorded Afebrile, VSS BMP stable creatinine is better.  Intake/Output from previous day: 06/18 0701 - 06/19 0700 In: 1287.5 [I.V.:1237.5; IV Piggyback:50] Out: 850 [Urine:850] Intake/Output this shift: No intake/output data recorded.  General appearance: alert, cooperative and no distress Resp: clear to auscultation bilaterally GI: soft, not really tender this AM. Nothing in the IR drain currently.  Still having some diarrhea, he also has foley in place.    Lab Results:   Recent Labs  01/25/17 0715 01/26/17 0548  WBC 9.1 8.9  HGB 11.3* 11.5*  HCT 35.4* 36.1*  PLT 227 238    BMET  Recent Labs  01/26/17 0548 01/27/17 0855  NA 138 137  K 4.6 4.3  CL 114* 113*  CO2 18* 13*  GLUCOSE 82 55*  BUN 17 21*  CREATININE 1.32* 1.26*  CALCIUM 8.2* 8.3*   PT/INR No results for input(s): LABPROT, INR in the last 72 hours.   Recent Labs Lab 01/23/17 1345  AST 26  ALT 26  ALKPHOS 70  BILITOT 0.4  PROT 8.0  ALBUMIN 3.1*     Lipase     Component Value Date/Time   LIPASE 43 01/23/2017 1345     Medications: . escitalopram  10 mg Oral Daily  . vancomycin  125 mg Oral QID   Followed by  . [START ON 02/09/2017] rifaximin  400 mg Oral TID  . traZODone  100 mg Oral QHS   . sodium chloride 1,000 mL (01/27/17 0639)  . ertapenem Stopped (01/26/17 1430)   Anti-infectives    Start     Dose/Rate Route Frequency Ordered Stop   02/09/17 1100  rifaximin (XIFAXAN) tablet 400 mg     400 mg  Oral 3 times daily 01/26/17 1006 02/23/17 0959   01/26/17 1100  vancomycin (VANCOCIN) 50 mg/mL oral solution 125 mg     125 mg Oral 4 times daily 01/26/17 1006 02/09/17 0959   01/25/17 1600  gentamicin (GARAMYCIN) 120 mg in dextrose 5 % 50 mL IVPB  Status:  Discontinued     120 mg 106 mL/hr over 30 Minutes Intravenous Every 12 hours 01/24/17 0624 01/25/17 1132   01/25/17 1400  ertapenem (INVANZ) 1 g in sodium chloride 0.9 % 50 mL IVPB     1 g 100 mL/hr over 30 Minutes Intravenous Every 24 hours 01/25/17 1132     01/24/17 0200  ciprofloxacin (CIPRO) IVPB 400 mg  Status:  Discontinued     400 mg 200 mL/hr over 60 Minutes Intravenous Every 12 hours 01/23/17 2205 01/25/17 1132   01/23/17 2200  metroNIDAZOLE (FLAGYL) IVPB 500 mg  Status:  Discontinued     500 mg 100 mL/hr over 60 Minutes Intravenous Every 8 hours 01/23/17 2158 01/25/17 1132   01/23/17 1600  gentamicin (GARAMYCIN) 540 mg in dextrose 5 % 100 mL IVPB  Status:  Discontinued     540 mg 113.5 mL/hr over 60 Minutes Intravenous Every 24 hours 01/23/17 1444 01/24/17 4431  01/23/17 1430  ciprofloxacin (CIPRO) IVPB 400 mg     400 mg 200 mL/hr over 60 Minutes Intravenous  Once 01/23/17 1351 01/23/17 1510   01/23/17 1430  metroNIDAZOLE (FLAGYL) IVPB 500 mg     500 mg 100 mL/hr over 60 Minutes Intravenous  Once 01/23/17 1351 01/23/17 1611      Assessment/Plan Diverticulitis with focal abscess   IR placed percutaneous drain - (01/26/2017)   Hospitalize from 2/17-10/09/16 with sigmoid diverticulitis with perforation/C diff colitis C.Difficile diarrhea   PO vanc (6/17>>), PO rifaximin (6/17>>) Recurrent Metastatic renal cell cancer - s/p left radical nephrectomy 04/23/2012 - peritoneal mets 2015 - followed by Dr. 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 Doing well.  His wife is not in the room.  That always makes communication a little more difficult. Nothing recorded from LLQ drain. Diet advanced to full liquids.  Alphonsa Overall, MD, [facility] Surgery Pager: [phone] Office phone:  [phone]

## 2017-01-27 NOTE — Progress Notes (Signed)
[facility]  PROGRESS NOTE  Austin [name] ZDG:[id number] DOB: July 07, 1956 DOA: 01/23/2017 PCP: [name], MD   Brief HPI:     61 y.o. 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 Located in the left lower side. 8 out of 10 in intensity, sharp. No radiation. Some nausea but no vomiting. No discomfort with urination. Normal bowel movement yesterday. Patient and wife very frustrated that this has recurred. No diarrhea. Patient was seen by his gastroenterologist recently and a CT scan was ordered for today which shows inflammatory changes with possible abscess formation. He will be hospitalized for further management.    Subjective   Patient seen and examined, stool for C. difficile positive. Started on vancomycin for 14 days followed by rifaximin.   Assessment/Plan:     1. Intra-abdominal abscess,secondary to diverticulitis- Gen. surgery following, patientWas started on Cipro, Flagyl and gentamicin. Which was changed to IV ertapenem after discussion with ID. Patient underwent CT-guided aspiration of left lower quadrant abscess with drain catheter placement per IR. Abscess culture is currently pending. Continue IV ertapenem until culture resulted. 2. C. difficile colitis- C. difficile antigen positive, C. difficile toxin is negative. Toxigenic C. difficile came back positive. We'll start patient on vancomycin for 14 days followed by rifaximin for recurrent C. difficile infection. Also start Florastor 250 mg twice a day 3. Acute kidney injury- patient was injected with creatinine of 1.7 to admission, improved to 1.26 with IV fluids. Patient's baseline creatinine is around 1. Follow BMP in a.m. 4.  Hypothyroidism- patient's medication list showed Synthroid and was started by admitting physician. As per nursing staff patient's wife said he was recently taken off  Synthroid as it  Was causing muscle spasm. As per note dated 01/20/2017 from [place], Dr. [name] held levothyroxine for a week. He'll be restarted on 01/28/2017. 5. Hyperchloremic metabolic acidosis- chloride was elevated to 114, CO2 18. We changed  IV fluids to half-normal saline at 75 per hour. CO2 is 13, chloride 113. Follow BMP in a.m. 6. 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 Will restart Plavix. 7. History of essential hypertension- blood pressure stable. 8. History of metastatic renal cell cancer- followed by oncology as outpatient  DVT prophylaxis: SCDs  Code Status: Full code  Family Communication: Discussed with wife at bedside  Disposition Plan: Pending culture results from the abscess.   Consultants:  Gen. surgery  Procedures:  None   Continuous infusions . sodium chloride 1,000 mL (01/27/17 0639)  . ertapenem Stopped (01/27/17 1358)      Antibiotics:   Anti-infectives    Start     Dose/Rate Route Frequency Ordered Stop   02/09/17 1100  rifaximin (XIFAXAN) tablet 400 mg     400 mg Oral 3 times daily 01/26/17 1006 02/23/17 0959   01/26/17 1100  vancomycin (VANCOCIN) 50 mg/mL oral solution 125 mg     125 mg Oral 4 times daily 01/26/17 1006 02/09/17 0959   01/25/17 1600  gentamicin (GARAMYCIN) 120 mg in dextrose 5 % 50 mL IVPB  Status:  Discontinued     120 mg 106 mL/hr over 30 Minutes Intravenous Every 12 hours 01/24/17 0624 01/25/17 1132   01/25/17 1400  ertapenem (INVANZ) 1 g in sodium chloride 0.9 % 50 mL IVPB     1 g 100 mL/hr over 30 Minutes Intravenous Every 24 hours 01/25/17  1132     01/24/17 0200  ciprofloxacin (CIPRO) IVPB 400 mg  Status:  Discontinued     400 mg 200 mL/hr over 60 Minutes Intravenous Every 12 hours 01/23/17 2205 01/25/17 1132   01/23/17 2200  metroNIDAZOLE (FLAGYL) IVPB 500 mg  Status:  Discontinued     500 mg 100 mL/hr over 60 Minutes Intravenous Every 8 hours 01/23/17 2158  01/25/17 1132   01/23/17 1600  gentamicin (GARAMYCIN) 540 mg in dextrose 5 % 100 mL IVPB  Status:  Discontinued     540 mg 113.5 mL/hr over 60 Minutes Intravenous Every 24 hours 01/23/17 1444 01/24/17 0623   01/23/17 1430  ciprofloxacin (CIPRO) IVPB 400 mg     400 mg 200 mL/hr over 60 Minutes Intravenous  Once 01/23/17 1351 01/23/17 1510   01/23/17 1430  metroNIDAZOLE (FLAGYL) IVPB 500 mg     500 mg 100 mL/hr over 60 Minutes Intravenous  Once 01/23/17 1351 01/23/17 1611       Objective   Vitals:   01/26/17 1446 01/26/17 2105 01/27/17 0526 01/27/17 1331  BP: (!) 143/64 131/63 127/66 127/65  Pulse: 74 78 76 76  Resp: 18 18 18 16   Temp: 97.9 F (36.6 C) 98.4 F (36.9 C) 97.7 F (36.5 C) 98.5 F (36.9 C)  TempSrc: Oral Oral Oral   SpO2: 99% 97% 98% 98%  Weight:      Height:        Intake/Output Summary (Last 24 hours) at 01/27/17 1711 Last data filed at 01/27/17 1328  Gross per 24 hour  Intake             1205 ml  Output             2120 ml  Net             -915 ml   Filed Weights   01/23/17 1409 01/23/17 2155  Weight: 78.5 kg (173 lb) 78.5 kg (173 lb 1 oz)     Physical Examination:  Physical Exam: Eyes: No icterus, extraocular muscles intact  Mouth: Oral mucosa is moist, no lesions on palate,  Neck: Supple, no deformities, masses, or tenderness Lungs: Normal respiratory effort, bilateral clear to auscultation, no crackles or wheezes.  Heart: Regular rate and rhythm, S1 and S2 normal, no murmurs, rubs auscultated Abdomen: BS normoactive,soft,nondistended,tender to palpation in LLQ, no organomegaly Extremities: No pretibial edema, no erythema, no cyanosis, no clubbing Neuro : Alert and oriented to time, place and person, No focal deficits  Skin: No rashes seen on exam     Data Reviewed: I have personally reviewed following labs and imaging studies  CBG: No results for input(s): GLUCAP in the last 168 hours.  CBC:  Recent Labs Lab 01/23/17 1345  01/24/17 0259 01/25/17 0715 01/26/17 0548  WBC 12.0* 10.5 9.1 8.9  HGB 12.0* 11.0* 11.3* 11.5*  HCT 37.0* 34.4* 35.4* 36.1*  MCV 85.6 85.4 85.5 85.1  PLT 235 207 227 852    Basic Metabolic Panel:  Recent Labs Lab 01/23/17 1345 01/24/17 0259 01/25/17 0715 01/26/17 0548 01/27/17 0855  NA 136 137 138 138 137  K 5.0 4.6 4.6 4.6 4.3  CL 105 110 114* 114* 113*  CO2 22 21* 16* 18* 13*  GLUCOSE 97 112* 71 82 55*  BUN 32* 26* 23* 17 21*  CREATININE 1.72* 1.49* 1.39* 1.32* 1.26*  CALCIUM 8.8* 8.1* 8.0* 8.2* 8.3*    Recent Results (from the past 240 hour(s))  Blood culture (routine x 2)  Status: None (Preliminary result)   Collection Time: 01/23/17  1:43 PM  Result Value Ref Range Status   Specimen Description BLOOD LEFT HAND  Final   Special Requests IN PEDIATRIC BOTTLE Blood Culture adequate volume  Final   Culture   Final    NO GROWTH 4 DAYS Performed at [facility], [place] [address] [place], [place] [address]    Report Status PENDING  Incomplete  Blood culture (routine x 2)     Status: None (Preliminary result)   Collection Time: 01/23/17  1:45 PM  Result Value Ref Range Status   Specimen Description BLOOD RIGHT HAND  Final   Special Requests   Final    BOTTLES DRAWN AEROBIC AND ANAEROBIC Blood Culture adequate volume   Culture   Final    NO GROWTH 4 DAYS Performed at [facility], [place] [address], [place], [place] [address]    Report Status PENDING  Incomplete  C difficile quick scan w PCR reflex     Status: Abnormal   Collection Time: 01/25/17  9:10 AM  Result Value Ref Range Status   C Diff antigen POSITIVE (A) NEGATIVE Final    Comment: RESULT CALLED TO, READ BACK BY AND VERIFIED WITH: CAUDLE,E AT 1100 ON [id number] BY HOOKER,B    C Diff toxin NEGATIVE NEGATIVE Final   C Diff interpretation Results are indeterminate. See PCR results.  Final  Clostridium Difficile by PCR     Status: Abnormal   Collection Time: 01/25/17  9:10 AM  Result Value Ref  Range Status   Toxigenic C Difficile by pcr POSITIVE (A) NEGATIVE Final    Comment: Positive for toxigenic C. difficile with little to no toxin production. Only treat if clinical presentation suggests symptomatic illness. Performed at [facility], [place] [address] [place], [place] [address]   Aerobic/Anaerobic Culture (surgical/deep wound)     Status: None (Preliminary result)   Collection Time: 01/26/17  9:36 AM  Result Value Ref Range Status   Specimen Description ABSCESS LLQ DIVERTICULAR  Final   Special Requests NONE  Final   Gram Stain   Final    MODERATE WBC PRESENT, PREDOMINANTLY PMN NO ORGANISMS SEEN    Culture   Final    NO GROWTH 1 DAY Performed at [facility], [place] [address] [place], [place] [address]    Report Status PENDING  Incomplete     Liver Function Tests:  Recent Labs Lab 01/23/17 1345  AST 26  ALT 26  ALKPHOS 70  BILITOT 0.4  PROT 8.0  ALBUMIN 3.1*    Recent Labs Lab 01/23/17 1345  LIPASE 43   No results for input(s): AMMONIA in the last 168 hours.    Studies: Ct Image Guided Fluid Drain By Catheter  Result Date: 01/26/2017 CLINICAL DATA:  Recurrent Diverticular abscess. Previous percutaneous aspiration of similar lesion 10/03/2016. History of metastatic renal cell carcinoma. EXAM: CT GUIDED DRAINAGE OF LEFT LOWER QUADRANT ABSCESS ANESTHESIA/SEDATION: Intravenous Fentanyl and Versed were administered as conscious sedation during continuous monitoring of the patient's level of consciousness and physiological / cardiorespiratory status by the radiology RN, with a total moderate sedation time of 21 minutes. PROCEDURE: The procedure, risks, benefits, and alternatives were explained to the patient. Questions regarding the procedure were encouraged and answered. The patient understands and consents to the procedure. Select axial scans through the lower abdomen were obtained. The collection was localized and an appropriate skin entry site was  determined and marked. The  operative field was prepped with chlorhexidinein a sterile fashion, and a sterile drape was applied covering the operative field. A sterile gown and sterile gloves were used for the procedure. Local anesthesia was provided with 1% Lidocaine. Under CT fluoroscopic guidance, a 19 gauge percutaneous entry needle was advanced into the collection. A a Amplatz guidewire advanced into the collection, confirmed on CT. Tract dilated to facilitate placement of a 12 French pigtail drain catheter, formed within the collection. Catheter secured externally with 0 Prolene suture and StatLock and placed to gravity drainage. A sample of the aspirate sent for Gram stain and culture. The patient tolerated the procedure well. COMPLICATIONS: None immediate FINDINGS: Complex gas and fluid collection adjacent to the distal descending/sigmoid junction was again localized. Right and mid peritoneal masses were again noted, as seen on recent CT. Percutaneous drain catheter placed as above. Sample of the aspirate sent for Gram stain and culture. IMPRESSION: 1. Technically successful CT-guided left lower quadrant percutaneous abscess drain catheter placement. Electronically Signed   By: Lucrezia Europe M.D.   On: 01/26/2017 10:14    Scheduled Meds: . escitalopram  10 mg Oral Daily  . vancomycin  125 mg Oral QID   Followed by  . [START ON 02/09/2017] rifaximin  400 mg Oral TID  . traZODone  100 mg Oral QHS      Time spent: 25 min  [facility] Pager [phone]. If 7PM-7AM, please contact night-coverage at [email or web address], Office  [phone]  password [email or web address] 01/27/2017, 5:11 PM  LOS: 4 days

## 2017-01-27 NOTE — Consult Note (Signed)
   [facility] Inpatient Consult   01/27/2017  CALLAWAY HAILES [birth year] [phone]    Patient screened for potential [facility] Care Management services. Went to bedside to discuss. However, Mr. Depuy was sleeping soundly and wife was not at bedside.   Will attempt to come back at later time to discuss [place] Management program.  Will make inpatient [facility] aware of above.   Marthenia Rolling, MSN-Ed, RN,BSN [facility] Liaison [phone]

## 2017-01-28 DIAGNOSIS — K651 Peritoneal abscess: Secondary | ICD-10-CM

## 2017-01-28 LAB — CULTURE, BLOOD (ROUTINE X 2)
CULTURE: NO GROWTH
Culture: NO GROWTH
SPECIAL REQUESTS: ADEQUATE
SPECIAL REQUESTS: ADEQUATE

## 2017-01-28 LAB — BASIC METABOLIC PANEL
Anion gap: 8 (ref 5–15)
BUN: 19 mg/dL (ref 6–20)
CHLORIDE: 113 mmol/L — AB (ref 101–111)
CO2: 16 mmol/L — AB (ref 22–32)
Calcium: 8.4 mg/dL — ABNORMAL LOW (ref 8.9–10.3)
Creatinine, Ser: 1.26 mg/dL — ABNORMAL HIGH (ref 0.61–1.24)
GFR calc Af Amer: >60 mL/min (ref >60)
GFR calc non Af Amer: 59 mL/min — ABNORMAL LOW (ref >60)
Glucose, Bld: 76 mg/dL (ref 65–99)
POTASSIUM: 4.4 mmol/L (ref 3.5–5.1)
SODIUM: 137 mmol/L (ref 135–145)

## 2017-01-28 LAB — CBC
HEMATOCRIT: 36.7 % — AB (ref 39.0–52.0)
Hemoglobin: 11.8 g/dL — ABNORMAL LOW (ref 13.0–17.0)
MCH: 26.9 pg (ref 26.0–34.0)
MCHC: 32.2 g/dL (ref 30.0–36.0)
MCV: 83.6 fL (ref 78.0–100.0)
Platelets: 244 10*3/uL (ref 150–400)
RBC: 4.39 MIL/uL (ref 4.22–5.81)
RDW: 16.7 % — AB (ref 11.5–15.5)
WBC: 10.5 10*3/uL (ref 4.0–10.5)

## 2017-01-28 MED ORDER — LEVOTHYROXINE SODIUM 25 MCG PO TABS
25.0000 ug | ORAL_TABLET | Freq: Every day | ORAL | Status: DC
  Administered 2017-01-29 – 2017-01-30 (×2): 25 ug via ORAL
  Filled 2017-01-28 (×2): qty 1

## 2017-01-28 MED ORDER — CLOPIDOGREL BISULFATE 75 MG PO TABS
75.0000 mg | ORAL_TABLET | Freq: Every day | ORAL | Status: DC
  Administered 2017-01-28 – 2017-01-29 (×2): 75 mg via ORAL
  Filled 2017-01-28 (×2): qty 1

## 2017-01-28 NOTE — Progress Notes (Signed)
Patient ID: Austin [name], male   DOB: 02/12/1956, 61 y.o.   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 Patient was seen by his gastroenterologist recently and a CT scan was ordered for today which shows inflammatory changes with possible abscess formation. Patient had CT-guided aspiration of the abscess with drain placement by IR. He was switched to IV ertapenem after discussion with ID. He was also started on oral vancomycin for C. difficile colitis.  Assessment & Plan:   Principal Problem:   Intra-abdominal abscess ([place]) Active Problems:   Renal carcinoma metastatic with carcinomatosis   Quadriplegia from brainstem stroke  1. Intra-abdominal abscess,secondary to diverticulitis- Gen. surgery following, patient was started on Cipro, Flagyl and gentamicin which was changed to IV ertapenem after discussion with ID. continue IV ertapenem for now. Patient underwent CT-guided aspiration of left lower quadrant abscess with drain catheter placement per IR. Abscess culture is currently pending. Continue IV ertapenem until culture resulted. 2. Recurrent C. difficile colitis- C. difficile antigen positive, Toxigenic C. difficile positive. Continue vancomycin for 14 days followed by rifaximin for recurrent C. difficile infection. 3. Acute kidney injury- improved creatinine to 1.26 today. Baseline creatinine is around 1. Follow BMP in a.m. 4.  Hypothyroidism- patient's medication list showed Synthroid and was started by admitting physician. As per nursing staff patient's wife said he was recently taken off Synthroid as it  Was causing muscle spasm. As per note dated 01/20/2017 from [facility], Dr. [name] held levothyroxine for a week.  He'll be restarted on 01/29/2017. 5. Hyperchloremic metabolic acidosis- improving . Follow BMP in a.m. 6. 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 Will restart Plavix. 7. History of essential hypertension- blood pressure stable. 8. History of metastatic renal cell cancer- followed by oncology as outpatient   DVT prophylaxis: SCDs Code Status:  Full code Family Communication: Discussed with wife present at bedside Disposition Plan: Home in 2-3 days  Consultants: Gen. surgery and IR  Procedures: CT-guided drainage of abscess and placement of drain by IR on 01/26/2017 Antimicrobials Ertapenem from 01/25/2017>> Oral vancomycin from 01/26/2017 IV ciprofloxacin, Flagyl, gentamicin from 01/23/2017 till 01/25/2017  Subjective: Patient seen and examined at bedside. He is awake and and  responds to questions by nodding his head  but doesn't answer much questions.  Objective: Vitals:   01/27/17 0526 01/27/17 1331 01/27/17 2044 01/28/17 0614  BP: 127/66 127/65 (!) 134/57 (!) 145/70  Pulse: 76 76 67 72  Resp: 18 16 16 17   Temp: 97.7 F (36.5 C) 98.5 F (36.9 C) 97.7 F (36.5 C) 97.9 F (36.6 C)  TempSrc: Oral  Oral Oral  SpO2: 98% 98% 98% 98%  Weight:      Height:        Intake/Output Summary (Last 24 hours) at 01/28/17 1335 Last data filed at 01/28/17 0619  Gross per 24 hour  Intake              905 ml  Output             1400 ml  Net             -495 ml   Filed Weights   01/23/17 1409 01/23/17 2155  Weight:  78.5 kg (173 lb) 78.5 kg (173 lb 1 oz)    Examination:  General exam: Appears calm and comfortable  Respiratory system: Bilateral decreased breath sound at bases Cardiovascular system: S1 & S2 heard, RRR.  Gastrointestinal system: Abdomen is nondistended, soft and nontender. Normal bowel sounds heard. Percutaneous drain in the left lower quadrant present Extremities: No cyanosis, clubbing, edema   Data  Reviewed: I have personally reviewed following labs and imaging studies  CBC:  Recent Labs Lab 01/23/17 1345 01/24/17 0259 01/25/17 0715 01/26/17 0548 01/28/17 0831  WBC 12.0* 10.5 9.1 8.9 10.5  HGB 12.0* 11.0* 11.3* 11.5* 11.8*  HCT 37.0* 34.4* 35.4* 36.1* 36.7*  MCV 85.6 85.4 85.5 85.1 83.6  PLT 235 207 227 238 629   Basic Metabolic Panel:  Recent Labs Lab 01/24/17 0259 01/25/17 0715 01/26/17 0548 01/27/17 0855 01/28/17 0831  NA 137 138 138 137 137  K 4.6 4.6 4.6 4.3 4.4  CL 110 114* 114* 113* 113*  CO2 21* 16* 18* 13* 16*  GLUCOSE 112* 71 82 55* 76  BUN 26* 23* 17 21* 19  CREATININE 1.49* 1.39* 1.32* 1.26* 1.26*  CALCIUM 8.1* 8.0* 8.2* 8.3* 8.4*   GFR: Estimated Creatinine Clearance: 67.5 mL/min (A) (by C-G formula based on SCr of 1.26 mg/dL (H)). Liver Function Tests:  Recent Labs Lab 01/23/17 1345  AST 26  ALT 26  ALKPHOS 70  BILITOT 0.4  PROT 8.0  ALBUMIN 3.1*    Recent Labs Lab 01/23/17 1345  LIPASE 43   No results for input(s): AMMONIA in the last 168 hours. Coagulation Profile: No results for input(s): INR, PROTIME in the last 168 hours. Cardiac Enzymes: No results for input(s): CKTOTAL, CKMB, CKMBINDEX, TROPONINI in the last 168 hours. BNP (last 3 results) No results for input(s): PROBNP in the last 8760 hours. HbA1C: No results for input(s): HGBA1C in the last 72 hours. CBG: No results for input(s): GLUCAP in the last 168 hours. Lipid Profile: No results for input(s): CHOL, HDL, LDLCALC, TRIG, CHOLHDL, LDLDIRECT in the last 72 hours. Thyroid Function Tests: No results for input(s): TSH, T4TOTAL, FREET4, T3FREE, THYROIDAB in the last 72 hours. Anemia Panel: No results for input(s): VITAMINB12, FOLATE, FERRITIN, TIBC, IRON, RETICCTPCT in the last 72 hours. Sepsis Labs:  Recent Labs Lab 01/23/17 1351  LATICACIDVEN 0.87    Recent Results (from the past 240 hour(s))  Blood culture (routine x 2)     Status: None (Preliminary  result)   Collection Time: 01/23/17  1:43 PM  Result Value Ref Range Status   Specimen Description BLOOD LEFT HAND  Final   Special Requests IN PEDIATRIC BOTTLE Blood Culture adequate volume  Final   Culture   Final    NO GROWTH 4 DAYS Performed at [facility], [place] [address], [place], [place] [address]    Report Status PENDING  Incomplete  Blood culture (routine x 2)     Status: None (Preliminary result)   Collection Time: 01/23/17  1:45 PM  Result Value Ref Range Status   Specimen Description BLOOD RIGHT HAND  Final   Special Requests   Final    BOTTLES DRAWN AEROBIC AND ANAEROBIC Blood Culture adequate volume   Culture   Final    NO GROWTH 4 DAYS Performed at [facility], [place] [address], [place], [place] [address]    Report Status PENDING  Incomplete  C difficile quick scan w PCR reflex     Status: Abnormal   Collection  Time: 01/25/17  9:10 AM  Result Value Ref Range Status   C Diff antigen POSITIVE (A) NEGATIVE Final    Comment: RESULT CALLED TO, READ BACK BY AND VERIFIED WITH: CAUDLE,E AT 1100 ON [id number] BY HOOKER,B    C Diff toxin NEGATIVE NEGATIVE Final   C Diff interpretation Results are indeterminate. See PCR results.  Final  Clostridium Difficile by PCR     Status: Abnormal   Collection Time: 01/25/17  9:10 AM  Result Value Ref Range Status   Toxigenic C Difficile by pcr POSITIVE (A) NEGATIVE Final    Comment: Positive for toxigenic C. difficile with little to no toxin production. Only treat if clinical presentation suggests symptomatic illness. Performed at [facility], [place] [address] [place], [place] [address]   Aerobic/Anaerobic Culture (surgical/deep wound)     Status: None (Preliminary result)   Collection Time: 01/26/17  9:36 AM  Result Value Ref Range Status   Specimen Description ABSCESS LLQ DIVERTICULAR  Final   Special Requests NONE  Final   Gram Stain   Final    MODERATE WBC PRESENT, PREDOMINANTLY PMN NO ORGANISMS SEEN    Culture    Final    NO GROWTH 1 DAY Performed at [facility], [place] [address] [place],  [address]    Report Status PENDING  Incomplete         Radiology Studies: No results found.      Scheduled Meds: . escitalopram  10 mg Oral Daily  . vancomycin  125 mg Oral QID   Followed by  . [START ON 02/09/2017] rifaximin  400 mg Oral TID  . saccharomyces boulardii  250 mg Oral BID  . traZODone  100 mg Oral QHS   Continuous Infusions: . sodium chloride 75 mL/hr at 01/28/17 0903  . ertapenem Stopped (01/27/17 1358)     LOS: 5 days        Aline August, MD [facility] Pager [phone]  If 7PM-7AM, please contact night-coverage [email or web address] Password [email or web address] 01/28/2017, 1:35 PM

## 2017-01-28 NOTE — Progress Notes (Addendum)
[facility] Office:  [phone] General Surgery Progress Note   LOS: 5 days  POD -     Chief Complaint: Abdominal pain  Assessment and Plan: 1.  Diverticulitis with focal abscess  Invanz and vanc - 6/18 >>>  WBC - 8,900 - 01/26/2017  IR perc drain - LLQ - 01/26/2017  Diet - full liquids Plan:  Looks good.  Will leave on full liquids today.  But can probably aim for discharge towards the end of the week.  2.  C. Diff - again  On oral Vanc 3.  Recurrent Metastatic renal cell cancer, s/p left radical nephrectomy 9/132013, peritoneal metastasis 2015  Followed by Dr. Jerilynn Mages 4.  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  Creatinine - 1.26 - 01/27/2017 6.  On Plavix - last dose 6/15. 7.  Sleep apnea 8.  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  Hard to understand.  He does better when his wife is there.  Objective:   Vitals:   01/27/17 2044 01/28/17 0614  BP: (!) 134/57 (!) 145/70  Pulse: 67 72  Resp: 16 17  Temp: 97.7 F (36.5 C) 97.9 F (36.6 C)     Intake/Output from previous day:  06/19 0701 - 06/20 0700 In: 910 [I.V.:900] Out: 2670 [Urine:2650; Drains:20]  Intake/Output this shift:  No intake/output data recorded.   Physical Exam:   General: WM who is alert and oriented. But his speech is hard to follow.   HEENT: Normal. Pupils equal. .   Lungs: Clear   Abdomen: Soft.  Perc drain in the LLQ - 20 cc recorded over the last 24 hours.   Neurologic:  Contractured left hand/arm, limited movement of right hand   Lab Results:     Recent Labs  01/26/17 0548  WBC 8.9  HGB 11.5*  HCT 36.1*  PLT 238    BMET    Recent Labs  01/26/17 0548 01/27/17 0855  NA 138 137  K 4.6 4.3  CL 114* 113*  CO2 18* 13*  GLUCOSE 82 55*  BUN 17 21*  CREATININE 1.32* 1.26*  CALCIUM 8.2* 8.3*     PT/INR  No results for input(s): LABPROT, INR in the last 72 hours.  ABG  No results for input(s): PHART, HCO3 in the last 72 hours.  Invalid input(s): PCO2, PO2   Studies/Results:  Ct Image Guided Fluid Drain By Catheter  Result Date: 01/26/2017 CLINICAL DATA:  Recurrent Diverticular abscess. Previous percutaneous aspiration of similar lesion 10/03/2016. History of metastatic renal cell carcinoma. EXAM: CT GUIDED DRAINAGE OF LEFT LOWER QUADRANT ABSCESS ANESTHESIA/SEDATION: Intravenous Fentanyl and Versed were administered as conscious sedation during continuous monitoring of the patient's level of consciousness and physiological / cardiorespiratory status by the radiology RN, with a total moderate sedation time of 21 minutes. PROCEDURE: The procedure, risks, benefits, and alternatives were explained to the patient. Questions regarding the procedure were encouraged and answered. The patient understands and consents to the procedure. Select axial scans through the lower abdomen were obtained. The collection was localized and an appropriate skin entry site was determined and marked. The operative field was prepped with chlorhexidinein a sterile fashion, and a sterile drape was applied covering the operative field. A sterile gown and sterile gloves were used for the procedure. Local anesthesia was provided with 1% Lidocaine. Under CT fluoroscopic guidance, a 19  gauge percutaneous entry needle was advanced into the collection. A a Amplatz guidewire advanced into the collection, confirmed on CT. Tract dilated to facilitate placement of a 12 French pigtail drain catheter, formed within the collection. Catheter secured externally with 0 Prolene suture and StatLock and placed to gravity drainage. A sample of the aspirate sent for Gram stain and culture. The patient tolerated the procedure well. COMPLICATIONS: None immediate FINDINGS: Complex gas and fluid collection adjacent to the distal descending/sigmoid  junction was again localized. Right and mid peritoneal masses were again noted, as seen on recent CT. Percutaneous drain catheter placed as above. Sample of the aspirate sent for Gram stain and culture. IMPRESSION: 1. Technically successful CT-guided left lower quadrant percutaneous abscess drain catheter placement. Electronically Signed   By: Lucrezia Europe M.D.   On: 01/26/2017 10:14     Anti-infectives:   Anti-infectives    Start     Dose/Rate Route Frequency Ordered Stop   02/09/17 1100  rifaximin (XIFAXAN) tablet 400 mg     400 mg Oral 3 times daily 01/26/17 1006 02/23/17 0959   01/26/17 1100  vancomycin (VANCOCIN) 50 mg/mL oral solution 125 mg     125 mg Oral 4 times daily 01/26/17 1006 02/09/17 0959   01/25/17 1600  gentamicin (GARAMYCIN) 120 mg in dextrose 5 % 50 mL IVPB  Status:  Discontinued     120 mg 106 mL/hr over 30 Minutes Intravenous Every 12 hours 01/24/17 0624 01/25/17 1132   01/25/17 1400  ertapenem (INVANZ) 1 g in sodium chloride 0.9 % 50 mL IVPB     1 g 100 mL/hr over 30 Minutes Intravenous Every 24 hours 01/25/17 1132     01/24/17 0200  ciprofloxacin (CIPRO) IVPB 400 mg  Status:  Discontinued     400 mg 200 mL/hr over 60 Minutes Intravenous Every 12 hours 01/23/17 2205 01/25/17 1132   01/23/17 2200  metroNIDAZOLE (FLAGYL) IVPB 500 mg  Status:  Discontinued     500 mg 100 mL/hr over 60 Minutes Intravenous Every 8 hours 01/23/17 2158 01/25/17 1132   01/23/17 1600  gentamicin (GARAMYCIN) 540 mg in dextrose 5 % 100 mL IVPB  Status:  Discontinued     540 mg 113.5 mL/hr over 60 Minutes Intravenous Every 24 hours 01/23/17 1444 01/24/17 0623   01/23/17 1430  ciprofloxacin (CIPRO) IVPB 400 mg     400 mg 200 mL/hr over 60 Minutes Intravenous  Once 01/23/17 1351 01/23/17 1510   01/23/17 1430  metroNIDAZOLE (FLAGYL) IVPB 500 mg     500 mg 100 mL/hr over 60 Minutes Intravenous  Once 01/23/17 1351 01/23/17 1611      Alphonsa Overall, MD, FACS Pager: [facility] Office: [phone] 01/28/2017

## 2017-01-29 LAB — MAGNESIUM: Magnesium: 1.7 mg/dL (ref 1.7–2.4)

## 2017-01-29 LAB — CBC WITH DIFFERENTIAL/PLATELET
BASOS PCT: 1 %
Basophils Absolute: 0.1 10*3/uL (ref 0.0–0.1)
Eosinophils Absolute: 0.5 10*3/uL (ref 0.0–0.7)
Eosinophils Relative: 5 %
HEMATOCRIT: 35.6 % — AB (ref 39.0–52.0)
HEMOGLOBIN: 11.7 g/dL — AB (ref 13.0–17.0)
LYMPHS ABS: 1.2 10*3/uL (ref 0.7–4.0)
LYMPHS PCT: 12 %
MCH: 27.5 pg (ref 26.0–34.0)
MCHC: 32.9 g/dL (ref 30.0–36.0)
MCV: 83.6 fL (ref 78.0–100.0)
MONOS PCT: 10 %
Monocytes Absolute: 1 10*3/uL (ref 0.1–1.0)
NEUTROS ABS: 7.2 10*3/uL (ref 1.7–7.7)
Neutrophils Relative %: 72 %
Platelets: 242 10*3/uL (ref 150–400)
RBC: 4.26 MIL/uL (ref 4.22–5.81)
RDW: 16.7 % — ABNORMAL HIGH (ref 11.5–15.5)
WBC: 9.8 10*3/uL (ref 4.0–10.5)

## 2017-01-29 LAB — BASIC METABOLIC PANEL
ANION GAP: 8 (ref 5–15)
BUN: 16 mg/dL (ref 6–20)
CO2: 18 mmol/L — AB (ref 22–32)
Calcium: 8.4 mg/dL — ABNORMAL LOW (ref 8.9–10.3)
Chloride: 113 mmol/L — ABNORMAL HIGH (ref 101–111)
Creatinine, Ser: 1.18 mg/dL (ref 0.61–1.24)
GFR calc Af Amer: >60 mL/min (ref >60)
GFR calc non Af Amer: >60 mL/min (ref >60)
GLUCOSE: 76 mg/dL (ref 65–99)
POTASSIUM: 4.1 mmol/L (ref 3.5–5.1)
Sodium: 139 mmol/L (ref 135–145)

## 2017-01-29 MED ORDER — METRONIDAZOLE 500 MG PO TABS
500.0000 mg | ORAL_TABLET | Freq: Three times a day (TID) | ORAL | Status: DC
  Administered 2017-01-29 – 2017-01-30 (×3): 500 mg via ORAL
  Filled 2017-01-29 (×3): qty 1

## 2017-01-29 MED ORDER — CEFUROXIME AXETIL 500 MG PO TABS
500.0000 mg | ORAL_TABLET | Freq: Two times a day (BID) | ORAL | Status: DC
  Administered 2017-01-29 – 2017-01-30 (×3): 500 mg via ORAL
  Filled 2017-01-29 (×5): qty 1

## 2017-01-29 MED ORDER — NYSTATIN 100000 UNIT/ML MT SUSP
5.0000 mL | Freq: Four times a day (QID) | OROMUCOSAL | Status: DC
  Administered 2017-01-29 – 2017-01-30 (×3): 500000 [IU] via ORAL
  Filled 2017-01-29 (×3): qty 5

## 2017-01-29 NOTE — Progress Notes (Signed)
Patient ID: Austin [name], male   DOB: 30-Jun-1956, 61 y.o.   MRN: [id number]   PROGRESS NOTE    Austin [name]  [name] DOB: 10/13/1955 DOA: 01/23/2017 PCP: Austin Post, MD   Brief Narrative:  61 y.o.malewith a past medical history of quadriplegia due to brainstem stroke, previous episode of diverticulitis with abscess presents with complaints of abdominal pain. Patient was seen by his gastroenterologist recently and a CT scan was ordered for today which shows inflammatory changes with possible abscess formation. Patient had CT-guided aspiration of the abscess with drain placement by IR. He was switched to IV ertapenem after discussion with ID. He was also started on oral vancomycin for C. difficile colitis.    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 Intra-abdominal abscess,secondary to diverticulitis- Gen. surgery following, patient was started on Cipro, Flagyl and gentamicin which was changed to IV ertapenem after discussion with ID. cultures negative so far; discontinue IV ertapenem and start oral cefuroxime and Flagyl. Patient underwent CT-guided aspiration of left lower quadrant abscess with drain catheter placement per IR.  2. Recurrent C. difficile colitis- C. difficile antigen positive, Toxigenic C. difficile positive. Continue vancomycin for 14 days followed by rifaximin for recurrent C. difficile infection. 3. Acute kidney injury- improved creatinine to 1.18 today. Baseline creatinine is around 1. Follow BMP in a.m. 4. Hypothyroidism-patient's medication list showed Synthroid and was started by admitting physician. As per nursing staff patient's wife said he was recently taken off Synthroid as it Was causing muscle spasm. As per note dated 01/20/2017 from [facility], Dr. Elease [name] held levothyroxine for a week. He'll be restarted  on 01/29/2017. 5. Hyperchloremic metabolic acidosis- improving .Follow BMP in a.m. 6. 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 7. History of essential hypertension- blood pressure stable. 8. History of metastatic renal cell cancer- followed by oncology as outpatient   DVT prophylaxis: SCDs Code Status:  Full code Family Communication: Discussed with wife present at bedside Disposition Plan: Home in 1-3 days  Consultants: Gen. surgery and IR  Procedures: CT-guided drainage of abscess and placement of drain by IR on 01/26/2017 Antimicrobials Ertapenem from 01/25/2017>> Oral vancomycin from 01/26/2017 IV ciprofloxacin, Flagyl, gentamicin from 01/23/2017 till 01/25/2017  Subjective: Patient seen and examined at bedside. He is awake and responds to questions by nodding his head doesn't answer much questions. His diarrhea is improving. No fever or vomiting reported.  Objective: Vitals:   01/28/17 1500 01/28/17 2123 01/29/17 0622 01/29/17 0917  BP: 129/71 122/68 128/63 132/65  Pulse: 70 74 80 71  Resp: 18 18 18 18   Temp: 98.5 F (36.9 C) 98.2 F (36.8 C) 97.7 F (36.5 C) 98.4 F (36.9 C)  TempSrc: Oral Oral Oral Oral  SpO2: 98% 96% 97% 98%  Weight:      Height:        Intake/Output Summary (Last 24 hours) at 01/29/17 1245 Last data filed at 01/29/17 1205  Gross per 24 hour  Intake             2363 ml  Output             2095 ml  Net              268 ml   Filed Weights   01/23/17 1409 01/23/17 2155  Weight: 78.5 kg (  173 lb) 78.5 kg (173 lb 1 oz)    Examination:  General exam: Appears calm and comfortable  Respiratory system: Bilateral decreased breath sound at bases Cardiovascular system: S1 & S2 heard  Gastrointestinal system: Abdomen is nondistended, soft and nontender. Normal bowel sounds heard. Percutaneous drain in the left lower quadrant present Extremities: No cyanosis, clubbing, edema     Data Reviewed: I have personally reviewed following labs and imaging studies  CBC:  Recent Labs Lab 01/24/17 0259 01/25/17 0715 01/26/17 0548 01/28/17 0831 01/29/17 0726  WBC 10.5 9.1 8.9 10.5 9.8  NEUTROABS  --   --   --   --  7.2  HGB 11.0* 11.3* 11.5* 11.8* 11.7*  HCT 34.4* 35.4* 36.1* 36.7* 35.6*  MCV 85.4 85.5 85.1 83.6 83.6  PLT 207 227 238 244 967   Basic Metabolic Panel:  Recent Labs Lab 01/25/17 0715 01/26/17 0548 01/27/17 0855 01/28/17 0831 01/29/17 0726  NA 138 138 137 137 139  K 4.6 4.6 4.3 4.4 4.1  CL 114* 114* 113* 113* 113*  CO2 16* 18* 13* 16* 18*  GLUCOSE 71 82 55* 76 76  BUN 23* 17 21* 19 16  CREATININE 1.39* 1.32* 1.26* 1.26* 1.18  CALCIUM 8.0* 8.2* 8.3* 8.4* 8.4*  MG  --   --   --   --  1.7   GFR: Estimated Creatinine Clearance: 72.1 mL/min (by C-G formula based on SCr of 1.18 mg/dL). Liver Function Tests:  Recent Labs Lab 01/23/17 1345  AST 26  ALT 26  ALKPHOS 70  BILITOT 0.4  PROT 8.0  ALBUMIN 3.1*    Recent Labs Lab 01/23/17 1345  LIPASE 43   No results for input(s): AMMONIA in the last 168 hours. Coagulation Profile: No results for input(s): INR, PROTIME in the last 168 hours. Cardiac Enzymes: No results for input(s): CKTOTAL, CKMB, CKMBINDEX, TROPONINI in the last 168 hours. BNP (last 3 results) No results for input(s): PROBNP in the last 8760 hours. HbA1C: No results for input(s): HGBA1C in the last 72 hours. CBG: No results for input(s): GLUCAP in the last 168 hours. Lipid Profile: No results for input(s): CHOL, HDL, LDLCALC, TRIG, CHOLHDL, LDLDIRECT in the last 72 hours. Thyroid Function Tests: No results for input(s): TSH, T4TOTAL, FREET4, T3FREE, THYROIDAB in the last 72 hours. Anemia Panel: No results for input(s): VITAMINB12, FOLATE, FERRITIN, TIBC, IRON, RETICCTPCT in the last 72 hours. Sepsis Labs:  Recent Labs Lab 01/23/17 1351  LATICACIDVEN 0.87    Recent Results (from the past 240 hour(s))    Blood culture (routine x 2)     Status: None   Collection Time: 01/23/17  1:43 PM  Result Value Ref Range Status   Specimen Description BLOOD LEFT HAND  Final   Special Requests IN PEDIATRIC BOTTLE Blood Culture adequate volume  Final   Culture   Final    NO GROWTH 5 DAYS Performed at [facility], [place] [address] [place], [place] [address]    Report Status 01/28/2017 FINAL  Final  Blood culture (routine x 2)     Status: None   Collection Time: 01/23/17  1:45 PM  Result Value Ref Range Status   Specimen Description BLOOD RIGHT HAND  Final   Special Requests   Final    BOTTLES DRAWN AEROBIC AND ANAEROBIC Blood Culture adequate volume   Culture   Final    NO GROWTH 5 DAYS Performed at [facility], [place] [address], [place], [place] [address]  Report Status 01/28/2017 FINAL  Final  C difficile quick scan w PCR reflex     Status: Abnormal   Collection Time: 01/25/17  9:10 AM  Result Value Ref Range Status   C Diff antigen POSITIVE (A) NEGATIVE Final    Comment: RESULT CALLED TO, READ BACK BY AND VERIFIED WITH: CAUDLE,E AT 1100 ON [id number] BY HOOKER,B    C Diff toxin NEGATIVE NEGATIVE Final   C Diff interpretation Results are indeterminate. See PCR results.  Final  Clostridium Difficile by PCR     Status: Abnormal   Collection Time: 01/25/17  9:10 AM  Result Value Ref Range Status   Toxigenic C Difficile by pcr POSITIVE (A) NEGATIVE Final    Comment: Positive for toxigenic C. difficile with little to no toxin production. Only treat if clinical presentation suggests symptomatic illness. Performed at [facility], [place] [address], [place], [place] [address]   Aerobic/Anaerobic Culture (surgical/deep wound)     Status: None (Preliminary result)   Collection Time: 01/26/17  9:36 AM  Result Value Ref Range Status   Specimen Description ABSCESS LLQ DIVERTICULAR  Final   Special Requests NONE  Final   Gram Stain   Final    MODERATE WBC PRESENT, PREDOMINANTLY PMN NO  ORGANISMS SEEN    Culture   Final    NO GROWTH 3 DAYS Performed at [facility], [place] [address], [place], [place] [address]    Report Status PENDING  Incomplete         Radiology Studies: No results found.      Scheduled Meds: . cefUROXime  500 mg Oral BID WC  . clopidogrel  75 mg Oral QHS  . escitalopram  10 mg Oral Daily  . levothyroxine  25 mcg Oral QAC breakfast  . metroNIDAZOLE  500 mg Oral Q8H  . vancomycin  125 mg Oral QID   Followed by  . [START ON 02/09/2017] rifaximin  400 mg Oral TID  . saccharomyces boulardii  250 mg Oral BID  . traZODone  100 mg Oral QHS   Continuous Infusions: . sodium chloride 75 mL/hr at 01/28/17 2344     LOS: 6 days        Aline August, MD [facility] Pager [phone]  If 7PM-7AM, please contact night-coverage [email or web address] Password [facility] 01/29/2017, 12:45 PM

## 2017-01-29 NOTE — Progress Notes (Addendum)
[facility] Progress Note     Subjective: CC: Patient wants to go home Denies abdominal pain, n/v. Tolerating full liquids. Still having watery diarrhea but less frequent. Per RN diarrhea is more normal in color, more brown than green now.   Objective: Vital signs in last 24 hours: Temp:  [97.7 F (36.5 C)-98.5 F (36.9 C)] 97.7 F (36.5 C) (06/21 0622) Pulse Rate:  [70-80] 80 (06/21 0622) Resp:  [18] 18 (06/21 0622) BP: (122-129)/(63-71) 128/63 (06/21 0622) SpO2:  [96 %-98 %] 97 % (06/21 0622) Last BM Date: 01/27/17  Intake/Output from previous day: 06/20 0701 - 06/21 0700 In: 2350 [P.O.:450; I.V.:1800; IV Piggyback:100] Out: 1450 [Urine:1450] Intake/Output this shift: No intake/output data recorded.  PE: Gen:  Alert, NAD, pleasant Card:  Regular rate and rhythm, no M/G/R Pulm:  Normal effort, clear to auscultation bilaterally Abd: Soft, non-tender, non-distended, bowel sounds present in all 4 quadrants. Drain in LLQ with <5 cc serosanguinous drainage. Skin: warm and dry, no rashes  Psych: A&Ox3   Lab Results:   Recent Labs  01/28/17 0831  WBC 10.5  HGB 11.8*  HCT 36.7*  PLT 244   BMET  Recent Labs  01/27/17 0855 01/28/17 0831  NA 137 137  K 4.3 4.4  CL 113* 113*  CO2 13* 16*  GLUCOSE 55* 76  BUN 21* 19  CREATININE 1.26* 1.26*  CALCIUM 8.3* 8.4*   CMP     Component Value Date/Time   NA 137 01/28/2017 0831   NA 135 (L) 11/11/2016 0913   K 4.4 01/28/2017 0831   K 4.6 11/11/2016 0913   CL 113 (H) 01/28/2017 0831   CO2 16 (L) 01/28/2017 0831   CO2 17 (L) 11/11/2016 0913   GLUCOSE 76 01/28/2017 0831   GLUCOSE 105 11/11/2016 0913   BUN 19 01/28/2017 0831   BUN 42.8 (H) 11/11/2016 0913   CREATININE 1.26 (H) 01/28/2017 0831   CREATININE 1.5 (H) 11/11/2016 0913   CALCIUM 8.4 (L) 01/28/2017 0831   CALCIUM 9.4 11/11/2016 0913   PROT 8.0 01/23/2017 1345   PROT 7.7 11/11/2016 0913   ALBUMIN 3.1 (L) 01/23/2017 1345   ALBUMIN 3.1 (L)  11/11/2016 0913   AST 26 01/23/2017 1345   AST 14 11/11/2016 0913   ALT 26 01/23/2017 1345   ALT 18 11/11/2016 0913   ALKPHOS 70 01/23/2017 1345   ALKPHOS 63 11/11/2016 0913   BILITOT 0.4 01/23/2017 1345   BILITOT <0.22 11/11/2016 0913   GFRNONAA 59 (L) 01/28/2017 0831   GFRAA >60 01/28/2017 0831   Lipase     Component Value Date/Time   LIPASE 43 01/23/2017 1345    Anti-infectives: Anti-infectives    Start     Dose/Rate Route Frequency Ordered Stop   02/09/17 1100  rifaximin (XIFAXAN) tablet 400 mg     400 mg Oral 3 times daily 01/26/17 1006 02/23/17 0959   01/26/17 1100  vancomycin (VANCOCIN) 50 mg/mL oral solution 125 mg     125 mg Oral 4 times daily 01/26/17 1006 02/09/17 0959   01/25/17 1600  gentamicin (GARAMYCIN) 120 mg in dextrose 5 % 50 mL IVPB  Status:  Discontinued     120 mg 106 mL/hr over 30 Minutes Intravenous Every 12 hours 01/24/17 0624 01/25/17 1132   01/25/17 1400  ertapenem (INVANZ) 1 g in sodium chloride 0.9 % 50 mL IVPB     1 g 100 mL/hr over 30 Minutes Intravenous Every 24 hours 01/25/17 1132     01/24/17  0200  ciprofloxacin (CIPRO) IVPB 400 mg  Status:  Discontinued     400 mg 200 mL/hr over 60 Minutes Intravenous Every 12 hours 01/23/17 2205 01/25/17 1132   01/23/17 2200  metroNIDAZOLE (FLAGYL) IVPB 500 mg  Status:  Discontinued     500 mg 100 mL/hr over 60 Minutes Intravenous Every 8 hours 01/23/17 2158 01/25/17 1132   01/23/17 1600  gentamicin (GARAMYCIN) 540 mg in dextrose 5 % 100 mL IVPB  Status:  Discontinued     540 mg 113.5 mL/hr over 60 Minutes Intravenous Every 24 hours 01/23/17 1444 01/24/17 0623   01/23/17 1430  ciprofloxacin (CIPRO) IVPB 400 mg     400 mg 200 mL/hr over 60 Minutes Intravenous  Once 01/23/17 1351 01/23/17 1510   01/23/17 1430  metroNIDAZOLE (FLAGYL) IVPB 500 mg     500 mg 100 mL/hr over 60 Minutes Intravenous  Once 01/23/17 1351 01/23/17 1611       Assessment/Plan Diverticulitis with focal abscess - IR placed  percutaneous drain - (01/26/2017)- 20 cc output yesterday - culture pending  - Hospitalized from 2/17-10/09/16 with sigmoid diverticulitis with perforation/C diff colitis C.Difficile diarrhea- PO vanc (6/17>>) x14 days total to be followed by PO rifaximin - Florastor BID Recurrent Metastatic renal cell cancer - s/p left radical nephrectomy 04/23/2012 - peritoneal mets 2015 - followed by Dr. Osker Mason Paraplegia from brainstem stroke 1993 - On Plavix- Plavix restarted 6/20 Hypertension Sleep apnea DM Creatinine- 1.26 yesterday, improving   FEN - advance diet to soft VTE - SCDs ID - Ertapenem (6/17>>), PO Vanc (6/17>>)  Plan: Patient is progressing, advance to fulls. Aim for discharge in next 1-2 days.   LOS: 6 days    [name] , [facility] 01/29/2017, 8:11 AM Pager: [phone] Consults: [phone]  Agree with above. Seems to be improving.  But the floor nurses have still not gotten him out of bed ??? Wife in room. Trying to get set for home tomorrow.  Alphonsa Overall, MD, [facility] Surgery Pager: [phone] Office phone:  [phone]

## 2017-01-29 NOTE — Consult Note (Signed)
   [facility] Inpatient Consult   01/29/2017  Austin [name] [birth year] [id number]    [facility] Care Management follow up.   Went to speak with [name] and wife about [place] Management program services on behalf of his [employer] insurance.  [name] is agreeable to [place] [facility] follow up on behalf of her husband. Written [facility] Care Management consent obtained and [facility] Care Management packet provided.  Denies any issues or concerns with medication or transportation. However, [name] states that patient will need liquid Vancomycin prescription when he is discharged because the pill form is way too expensive.   Confirmed Primary Care MD is Dr. [name]. Confirmed best contact numbers as [phone].  [name] indicates the plan is to return home. States she is awaiting for therapy to work with her husband however to determine further needs.   Discussed that [name] will receive post hospital discharge calls and will be evaluated for monthly home visits. Explained that [place] Management will not interfere or replace services provided by home health.  Spoke with patient's nurse about therapy order and liquid vancomycin.   Made inpatient RNCM aware of above.  Will make referral for [facility] for post hospital follow up. Has history of DM, CVA, HTN, recurrent cdiff, quadraplegia, metastatic renal cell cancer, and intra-abdominal abscess secondary for diverticulitis.   Marthenia Rolling, MSN-Ed, RN,BSN [facility] Liaison [phone]

## 2017-01-29 NOTE — Progress Notes (Signed)
Date:  January 29, 2017 Chart reviewed for concurrent status and case management needs. Will continue to follow patient progress. Subjective: CC: Patient wants to go home Denies abdominal pain, n/v. Tolerating full liquids. Still having watery diarrhea but less frequent. Per RN diarrhea is more normal in color, more brown than green now.   Objective: Vital signs in last 24 hours: Temp:  [97.7 F (36.5 C)-98.5 F (36.9 C)] 97.7 F (36.5 C) (06/21 0622) Pulse Rate:  [70-80] 80 (06/21 0622) Resp:  [18] 18 (06/21 0622) BP: (122-129)/(63-71) 128/63 (06/21 0622) SpO2:  [96 %-98 %] 97 % (06/21 0622) Last BM Date: 01/27/17 Discharge Planning: following for needs Expected discharge date: [id number] Velva Harman, [place], [place], [place]

## 2017-01-30 ENCOUNTER — Other Ambulatory Visit: Payer: Self-pay | Admitting: Radiology

## 2017-01-30 DIAGNOSIS — K5720 Diverticulitis of large intestine with perforation and abscess without bleeding: Secondary | ICD-10-CM

## 2017-01-30 LAB — CBC WITH DIFFERENTIAL/PLATELET
BASOS ABS: 0.1 10*3/uL (ref 0.0–0.1)
BASOS PCT: 1 %
Eosinophils Absolute: 0.5 10*3/uL (ref 0.0–0.7)
Eosinophils Relative: 5 %
HEMATOCRIT: 36.2 % — AB (ref 39.0–52.0)
Hemoglobin: 11.9 g/dL — ABNORMAL LOW (ref 13.0–17.0)
LYMPHS PCT: 12 %
Lymphs Abs: 1.3 10*3/uL (ref 0.7–4.0)
MCH: 27.5 pg (ref 26.0–34.0)
MCHC: 32.9 g/dL (ref 30.0–36.0)
MCV: 83.6 fL (ref 78.0–100.0)
MONO ABS: 1.1 10*3/uL — AB (ref 0.1–1.0)
Monocytes Relative: 10 %
NEUTROS ABS: 7.7 10*3/uL (ref 1.7–7.7)
Neutrophils Relative %: 72 %
PLATELETS: 242 10*3/uL (ref 150–400)
RBC: 4.33 MIL/uL (ref 4.22–5.81)
RDW: 16.9 % — AB (ref 11.5–15.5)
WBC: 10.6 10*3/uL — AB (ref 4.0–10.5)

## 2017-01-30 LAB — BASIC METABOLIC PANEL
ANION GAP: 7 (ref 5–15)
BUN: 19 mg/dL (ref 6–20)
CO2: 19 mmol/L — ABNORMAL LOW (ref 22–32)
Calcium: 8.3 mg/dL — ABNORMAL LOW (ref 8.9–10.3)
Chloride: 113 mmol/L — ABNORMAL HIGH (ref 101–111)
Creatinine, Ser: 1.22 mg/dL (ref 0.61–1.24)
Glucose, Bld: 98 mg/dL (ref 65–99)
POTASSIUM: 3.9 mmol/L (ref 3.5–5.1)
Sodium: 139 mmol/L (ref 135–145)

## 2017-01-30 LAB — MAGNESIUM: Magnesium: 1.8 mg/dL (ref 1.7–2.4)

## 2017-01-30 MED ORDER — RIFAXIMIN 200 MG PO TABS: 400.0000 mg | ORAL_TABLET | Freq: Three times a day (TID) | ORAL | 0 refills | Status: AC

## 2017-01-30 MED ORDER — VANCOMYCIN 50 MG/ML ORAL SOLUTION: 125.0000 mg | Freq: Four times a day (QID) | ORAL | 0 refills | Status: AC

## 2017-01-30 MED ORDER — SACCHAROMYCES BOULARDII 250 MG PO CAPS: 250.0000 mg | ORAL_CAPSULE | Freq: Two times a day (BID) | ORAL | 0 refills | Status: DC

## 2017-01-30 MED ORDER — CEFUROXIME AXETIL 500 MG PO TABS
500.0000 mg | ORAL_TABLET | Freq: Two times a day (BID) | ORAL | 0 refills | Status: DC
Start: 2017-01-30 — End: 2017-02-06

## 2017-01-30 MED ORDER — METRONIDAZOLE 500 MG PO TABS: 500.0000 mg | ORAL_TABLET | Freq: Three times a day (TID) | ORAL | 0 refills | Status: DC

## 2017-01-30 MED ORDER — NYSTATIN 100000 UNIT/ML MT SUSP: 5.0000 mL | Freq: Four times a day (QID) | OROMUCOSAL | 0 refills | Status: DC

## 2017-01-30 MED FILL — VANCOMYCIN 50MG/ML ORAL SOL: 10 days supply | Qty: 100 | Fill #0

## 2017-01-30 NOTE — Evaluation (Signed)
Physical Therapy Evaluation-1x Patient Details Name: Austin [name] MRN: [id number] DOB: 02-18-1956 Today's Date: 01/30/2017   History of Present Illness  61 yo male admitted with abscess, diverticulitis. Hx of brainstem CVA with residual quadriplegia, met renal cell cancer. S/P drain placement by IR 01/26/17    Clinical Impression  On eval, pt required Total assist for bed mobility and Mod assist for standing and transfers. Wife performed mobility with close guarding from PT provided. Discussed d/c plan-pt will return home. Wife feels comfortable continuing to manage pt's care. She politely declines HHPT f/u. 1x eval.     Follow Up Recommendations No PT follow up;Supervision/Assistance - 24 hour    Equipment Recommendations  None recommended by PT    Recommendations for Other Services       Precautions / Restrictions Precautions Precautions: Fall Precaution Comments: drain L abdomen Restrictions Weight Bearing Restrictions: No      Mobility  Bed Mobility Overal bed mobility: Needs Assistance Bed Mobility: Supine to Sit     Supine to sit: Total assist     General bed mobility comments: Assist for trunk and bil LEs.   Transfers Overall transfer level: Needs assistance   Transfers: Sit to/from Stand;Stand Pivot Transfers Sit to Stand: Mod assist Stand pivot transfers: Mod assist       General transfer comment: Assist to rise, stabilize, weightshift, position feet, control descent. Pt is able to weightbear enough to allow for transfer. Stand pivot, bed to recliner  Ambulation/Gait                Stairs            Wheelchair Mobility    Modified Rankin (Stroke Patients Only)       Balance                                             Pertinent Vitals/Pain Pain Assessment: No/denies pain    Home Living Family/patient expects to be discharged to:: Private residence Living Arrangements: Spouse/significant other Available  Help at Discharge: Family;Available 24 hours/day Type of Home: House         Home Equipment: Grab bars - tub/shower;Wheelchair - power Additional Comments: hx of CVA at least 23 years ago, home set up adequately for pt needs per spouse    Prior Function Level of Independence: Needs assistance   Gait / Transfers Assistance Needed: spouse assists pt to standing, pt able to weight bear and assist, can perform stand pivot for transfers with min-mod assist, sometimes uses disc spinning device under pt's feet for pivoting (in mornings)           Hand Dominance        Extremity/Trunk Assessment   Upper Extremity Assessment Upper Extremity Assessment:  (quadriplegia)    Lower Extremity Assessment Lower Extremity Assessment:  (quadriplegia)       Communication   Communication: Expressive difficulties  Cognition Arousal/Alertness: Awake/alert Behavior During Therapy: WFL for tasks assessed/performed Overall Cognitive Status: Difficult to assess                                        General Comments      Exercises     Assessment/Plan    PT Assessment Patent does not need any further PT services  PT Problem List         PT Treatment Interventions      PT Goals (Current goals can be found in the Care Plan section)  Acute Rehab PT Goals Patient Stated Goal: return home PT Goal Formulation: All assessment and education complete, DC therapy    Frequency     Barriers to discharge        Co-evaluation               AM-PAC PT "6 Clicks" Daily Activity  Outcome Measure Difficulty turning over in bed (including adjusting bedclothes, sheets and blankets)?: Total Difficulty moving from lying on back to sitting on the side of the bed? : Total Difficulty sitting down on and standing up from a chair with arms (e.g., wheelchair, bedside commode, etc,.)?: Total Help needed moving to and from a bed to chair (including a wheelchair)?: A Lot Help  needed walking in hospital room?: Total Help needed climbing 3-5 steps with a railing? : Total 6 Click Score: 7    End of Session Equipment Utilized During Treatment: Gait belt Activity Tolerance: Patient tolerated treatment well Patient left: in chair;with call bell/phone within reach;with family/visitor present   PT Visit Diagnosis: Muscle weakness (generalized) (M62.81)    Time: 2854-9656 PT Time Calculation (min) (ACUTE ONLY): 30 min   Charges:   PT Evaluation $PT Eval Low Complexity: 1 Procedure PT Treatments $Therapeutic Activity: 8-22 mins   PT G Codes:         Weston Anna, MPT Pager: [phone]

## 2017-01-30 NOTE — Discharge Summary (Signed)
Physician Discharge Summary  Austin [name] AQT:[id number] DOB: 1956-05-01 DOA: 01/23/2017  PCP: [name], MD  Admit date: 01/23/2017 Discharge date: 01/30/2017  Admitted From: Home Disposition:  Home  Recommendations for Outpatient Follow-up:  1. Follow up with PCP in 1 2. Please obtain BMP/CBC in one week 3.   Please follow up with general surgery in 1-2 weeks  4.   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 Patient was seen by his gastroenterologist recently and a CT scan was ordered for today which shows inflammatory changes with possible abscess formation. Patient had CT-guided aspiration of the abscess with drain placement by IR. He was switched to IV ertapenem after discussion with ID. He was also started on oral vancomycin for C. difficile colitis.  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 Intra-abdominal abscess,secondary to diverticulitis- Gen. surgery following, patient was started on Cipro, Flagyl and gentamicin which was changed to IV ertapenem after discussion with ID. cultures negative so far; IV ertapenem discontinued on 01/29/2017 and oral cefuroxime and Flagyl were started. Patient has tolerated cefuroxime and Flagyl. Patient underwent CT-guided aspiration of left lower quadrant abscess with drain catheter placement per IR. If okay with general surgery, patient will be discharged home on oral cefuroxime and Flagyl,  duration of antibiotic therapy will be decided probably by repeat CAT scan of the abdomen which will be ordered for probably by general surgery or interventional radiology. Follow-up with interventional radiology regarding percutaneous abdominal drain.  Patient might need outpatient ID evaluation if there are issues with antibiotics 2. Recurrent C. difficile colitis- C. difficile antigen positive, Toxigenic C. difficile positive. Continue vancomycin for 14 days followed by rifaximin for 14 days for recurrent C. difficile infection. 3. Acute kidney injury- improved creatinine to 1.22 today. Baseline creatinine is around 1. Outpatient follow-up 4. Hypothyroidism-resume Synthroid on discharge 5. Hyperchloremic metabolic acidosis- improved  6. 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 7. History of essential hypertension- blood pressure stable. 8. History of metastatic renal cell cancer- followed by oncology as outpatient   Discharge Instructions  Discharge Instructions    AMB Referral to [facility]    Complete by:  As directed    Please assign to [place] for post discharge transition of care. Multiple co-morbidities. Written consent obtained. Currently at [facility]. Marthenia Rolling, MSN-Ed, RN,BSN [facility] [phone]   Reason for consult:  Please assign to [place]   Expected date of contact:  1-3 days (reserved for hospital discharges)   Call MD for:  difficulty breathing, headache or visual disturbances    Complete by:  As directed    Call MD for:  extreme fatigue    Complete by:  As directed    Call MD for:  hives    Complete by:  As directed    Call MD for:  persistant nausea and vomiting    Complete by:  As directed    Call MD for:  severe uncontrolled pain  Complete by:  As directed    Call MD for:  temperature >100.4    Complete by:  As directed     Diet - low sodium heart healthy    Complete by:  As directed    Soft diet   Increase activity slowly    Complete by:  As directed      Allergies as of 01/30/2017      Reactions   Penicillins Rash, Other (See Comments)   Has patient had a PCN reaction causing immediate rash, facial/tongue/throat swelling, SOB or lightheadedness with hypotension: No Has patient had a PCN reaction causing severe rash involving mucus membranes or skin necrosis: No Has patient had a PCN reaction that required hospitalization No Has patient had a PCN reaction occurring within the last 10 years: No If all of the above answers are "NO", then may proceed with Cephalosporin use.      Medication List    STOP taking these medications   trimethoprim 100 MG tablet Commonly known as:  TRIMPEX     TAKE these medications   albuterol 108 (90 Base) MCG/ACT inhaler Commonly known as:  PROVENTIL HFA;VENTOLIN HFA Inhale 2 puffs into the lungs every 6 (six) hours as needed for wheezing or shortness of breath.   atorvastatin 20 MG tablet Commonly known as:  LIPITOR Take 20 mg by mouth daily.   cefUROXime 500 MG tablet Commonly known as:  CEFTIN Take 1 tablet (500 mg total) by mouth 2 (two) times daily with a meal.   clopidogrel 75 MG tablet Commonly known as:  PLAVIX Take 75 mg by mouth at bedtime.   escitalopram 10 MG tablet Commonly known as:  LEXAPRO Take 10 mg by mouth daily.   ketoconazole 2 % cream Commonly known as:  NIZORAL APPLY AS NEEDED TO RASH What changed:  See the new instructions.   levothyroxine 25 MCG tablet Commonly known as:  SYNTHROID, LEVOTHROID Take 1 tablet (25 mcg total) by mouth daily before breakfast.   lisinopril-hydrochlorothiazide 20-12.5 MG tablet Commonly known as:  PRINZIDE,ZESTORETIC Take 1 tablet by mouth daily.   metroNIDAZOLE 500 MG tablet Commonly known as:  FLAGYL Take 1 tablet (500 mg total) by mouth every 8 (eight) hours.   multivitamin with minerals Tabs  tablet Take 1 tablet by mouth daily.   nystatin 100000 UNIT/ML suspension Commonly known as:  MYCOSTATIN Take 5 mLs (500,000 Units total) by mouth 4 (four) times daily.   rifaximin 200 MG tablet Commonly known as:  XIFAXAN Take 2 tablets (400 mg total) by mouth 3 (three) times daily. Start taking on:  02/09/2017   saccharomyces boulardii 250 MG capsule Commonly known as:  FLORASTOR Take 1 capsule (250 mg total) by mouth 2 (two) times daily.   testosterone enanthate 200 MG/ML injection Commonly known as:  DELATESTRYL Inject 200 mg into the muscle every 14 (fourteen) days. For IM use only   traMADol 50 MG tablet Commonly known as:  ULTRAM Take 50 mg by mouth every 6 (six) hours as needed for moderate pain.   traZODone 100 MG tablet Commonly known as:  DESYREL Take 100 mg by mouth at bedtime as needed for sleep.   triamcinolone cream 0.1 % Commonly known as:  KENALOG Apply 1 application topically 2 (two) times daily as needed (for rash).   vancomycin 50 mg/mL oral solution Commonly known as:  VANCOCIN Take 2.5 mLs (125 mg total) by mouth 4 (four) times daily.      Follow-up Information  [name], MD Follow up in 1 week(s).   Specialty:  Family Medicine Contact information: [place] [address] [phone]        Alphonsa Overall, MD Follow up in 1 week(s).   Specialty:  General Surgery Contact information: [address] [place]  [address] [phone]        [place] Follow up in 1 week(s).   Why:  Follow up with Intervention radiology regarding the Percutaneous abdominal drain         Allergies  Allergen Reactions  . Penicillins Rash and Other (See Comments)    Has patient had a PCN reaction causing immediate rash, facial/tongue/throat swelling, SOB or lightheadedness with hypotension: No Has patient had a PCN reaction causing severe rash involving mucus membranes or skin necrosis: No Has patient  had a PCN reaction that required hospitalization No Has patient had a PCN reaction occurring within the last 10 years: No If all of the above answers are "NO", then may proceed with Cephalosporin use.    Consultations: general surgery and IR  Procedures/Studies: Ct Abdomen Pelvis W Contrast  Result Date: 01/23/2017 CLINICAL DATA:  Left lower quadrant abdominal pain for 1 week. Recent perforated sigmoid diverticulitis requiring percutaneous CT-guided drainage in February 2018. History of metastatic renal cell carcinoma. EXAM: CT ABDOMEN AND PELVIS WITH CONTRAST TECHNIQUE: Multidetector CT imaging of the abdomen and pelvis was performed using the standard protocol following bolus administration of intravenous contrast. CONTRAST:  51mL ISOVUE-300 IOPAMIDOL (ISOVUE-300) INJECTION 61% COMPARISON:  10/08/2016 CT abdomen/ pelvis. FINDINGS: Lower chest: Enlarging 1.1 cm basilar left lower lobe solid pulmonary nodule (series 4/ image 11), previously 0.8 cm. New 0.5 cm left lower lobe solid pulmonary nodule (series 4/ image 6). Enlarging 0.7 cm anterior right lower lobe solid pulmonary nodule (series 4/ image 3), previously 0.4 cm. Hepatobiliary: Normal liver with no liver mass. Cholecystectomy. No biliary ductal dilatation. Pancreas: Normal, with no mass or duct dilation. Spleen: Solitary hypodense 1.5 x 3.9 cm splenic mass (series 3/ image 21), previously 4.6 x 4.3 cm, not appreciably changed. Overall normal size spleen. Adrenals/Urinary Tract: Left adrenal 1.8 x 1.5 cm mass (series 3/ image 31), previously 1.8 x 1.5 cm, stable. Hyperdense 0.8 cm right adrenal nodule (series 3/ image 25), stable. Status post left nephrectomy. Stable fat necrosis in the left nephrectomy bed. No additional mass or fluid collection in the left nephrectomy bed. No right hydronephrosis. No right renal mass. Normal bladder. Stomach/Bowel: Grossly normal stomach. Normal caliber small bowel with no small bowel wall thickening. Normal  appendix. Mild sigmoid diverticulosis. Pericolonic gas containing 3.9 x 2.7 cm abscess adjacent to the sigmoid colon (series 3/ image 73), increased from 2.8 x 1.8 cm on 10/08/2016. Mild adjacent sigmoid colon wall thickening and pericolonic fat stranding. No large bowel dilatation. Vascular/Lymphatic: Atherosclerotic nonaneurysmal abdominal aorta. Patent portal, splenic, hepatic and right renal veins. No pathologically enlarged lymph nodes in the abdomen or pelvis. Reproductive: Top-normal size prostate with nonspecific internal prostatic calcifications. Other: No additional abdominopelvic fluid collections. Stable mild to moderate gynecomastia, asymmetric to the right. Hyperenhancing 1.4 cm peritoneal implant inferior to the right liver lobe (series 3/image 30), increased from 0.6 cm. Irregular hyperenhancing 6.6 x 4.9 cm right paracolic gutter peritoneal implant (series 3/image 56), increased from 5.7 x 4.7 cm. Musculoskeletal: No aggressive appearing focal osseous lesions. Moderate thoracolumbar spondylosis. IMPRESSION: 1. Pericolonic left lower quadrant 3.9 x 2.7 cm gas containing abscess adjacent to the sigmoid colon, increased in  size since 10/08/2016 CT study. 2. Progression of pulmonary metastases at the lung bases . 3. Progression of scattered peritoneal metastases. No evidence of bowel obstruction. 4. Stable splenic and bilateral adrenal metastases. These results will be called to the ordering clinician or representative by the Radiology Department at the imaging location. Electronically Signed   By: Ilona Sorrel M.D.   On: 01/23/2017 11:09   Ct Image Guided Fluid Drain By Catheter  Result Date: 01/26/2017 CLINICAL DATA:  Recurrent Diverticular abscess. Previous percutaneous aspiration of similar lesion 10/03/2016. History of metastatic renal cell carcinoma. EXAM: CT GUIDED DRAINAGE OF LEFT LOWER QUADRANT ABSCESS ANESTHESIA/SEDATION: Intravenous Fentanyl and Versed were administered as conscious  sedation during continuous monitoring of the patient's level of consciousness and physiological / cardiorespiratory status by the radiology RN, with a total moderate sedation time of 21 minutes. PROCEDURE: The procedure, risks, benefits, and alternatives were explained to the patient. Questions regarding the procedure were encouraged and answered. The patient understands and consents to the procedure. Select axial scans through the lower abdomen were obtained. The collection was localized and an appropriate skin entry site was determined and marked. The operative field was prepped with chlorhexidinein a sterile fashion, and a sterile drape was applied covering the operative field. A sterile gown and sterile gloves were used for the procedure. Local anesthesia was provided with 1% Lidocaine. Under CT fluoroscopic guidance, a 19 gauge percutaneous entry needle was advanced into the collection. A a Amplatz guidewire advanced into the collection, confirmed on CT. Tract dilated to facilitate placement of a 12 French pigtail drain catheter, formed within the collection. Catheter secured externally with 0 Prolene suture and StatLock and placed to gravity drainage. A sample of the aspirate sent for Gram stain and culture. The patient tolerated the procedure well. COMPLICATIONS: None immediate FINDINGS: Complex gas and fluid collection adjacent to the distal descending/sigmoid junction was again localized. Right and mid peritoneal masses were again noted, as seen on recent CT. Percutaneous drain catheter placed as above. Sample of the aspirate sent for Gram stain and culture. IMPRESSION: 1. Technically successful CT-guided left lower quadrant percutaneous abscess drain catheter placement. Electronically Signed   By: Lucrezia Europe M.D.   On: 01/26/2017 10:14    CT-guided drainage of abscess and placement of drain by IR on 01/26/2017   Subjective: Patient seen and examined at bedside. He is awake and responds to questions  by nodding his head but does not answer much questions. His wife mentions that patient's diarrhea has improved. No overnight fever or vomiting reported.  Discharge Exam: Vitals:   01/29/17 2047 01/30/17 0457  BP: (!) 114/58 (!) 115/54  Pulse: 73 76  Resp: 18 18  Temp: 98.3 F (36.8 C) 97.5 F (36.4 C)   Vitals:   01/29/17 0917 01/29/17 1340 01/29/17 2047 01/30/17 0457  BP: 132/65 (!) 141/62 (!) 114/58 (!) 115/54  Pulse: 71 73 73 76  Resp: 18 17 18 18   Temp: 98.4 F (36.9 C) 98.3 F (36.8 C) 98.3 F (36.8 C) 97.5 F (36.4 C)  TempSrc: Oral Oral Oral Oral  SpO2: 98% 98% 98% 96%  Weight:      Height:        General: Pt is alert, awake, not in acute distress Cardiovascular: RRR, S1/S2 +,  Respiratory: Bilateral decreased breath sounds at bases Abdominal:  Abdomen is nondistended, soft and nontender. Normal bowel sounds heard. Percutaneous drain in the left lower quadrant present Extremities: no edema, no cyanosis    The  results of significant diagnostics from this hospitalization (including imaging, microbiology, ancillary and laboratory) are listed below for reference.     Microbiology: Recent Results (from the past 240 hour(s))  Blood culture (routine x 2)     Status: None   Collection Time: 01/23/17  1:43 PM  Result Value Ref Range Status   Specimen Description BLOOD LEFT HAND  Final   Special Requests IN PEDIATRIC BOTTLE Blood Culture adequate volume  Final   Culture   Final    NO GROWTH 5 DAYS Performed at [facility], [address] [place], [place] [address]    Report Status 01/28/2017 FINAL  Final  Blood culture (routine x 2)     Status: None   Collection Time: 01/23/17  1:45 PM  Result Value Ref Range Status   Specimen Description BLOOD RIGHT HAND  Final   Special Requests   Final    BOTTLES DRAWN AEROBIC AND ANAEROBIC Blood Culture adequate volume   Culture   Final    NO GROWTH 5 DAYS Performed at [facility], [place] [address],  [place], [place] [address]    Report Status 01/28/2017 FINAL  Final  C difficile quick scan w PCR reflex     Status: Abnormal   Collection Time: 01/25/17  9:10 AM  Result Value Ref Range Status   C Diff antigen POSITIVE (A) NEGATIVE Final    Comment: RESULT CALLED TO, READ BACK BY AND VERIFIED WITH: CAUDLE,E AT 1100 ON [id number] BY HOOKER,B    C Diff toxin NEGATIVE NEGATIVE Final   C Diff interpretation Results are indeterminate. See PCR results.  Final  Clostridium Difficile by PCR     Status: Abnormal   Collection Time: 01/25/17  9:10 AM  Result Value Ref Range Status   Toxigenic C Difficile by pcr POSITIVE (A) NEGATIVE Final    Comment: Positive for toxigenic C. difficile with little to no toxin production. Only treat if clinical presentation suggests symptomatic illness. Performed at [facility], [place] [address] [place], [place] [address]   Aerobic/Anaerobic Culture (surgical/deep wound)     Status: None (Preliminary result)   Collection Time: 01/26/17  9:36 AM  Result Value Ref Range Status   Specimen Description ABSCESS LLQ DIVERTICULAR  Final   Special Requests NONE  Final   Gram Stain   Final    MODERATE WBC PRESENT, PREDOMINANTLY PMN NO ORGANISMS SEEN    Culture   Final    CULTURE REINCUBATED FOR BETTER GROWTH Performed at [facility], [place] [address] [place],  [address]    Report Status PENDING  Incomplete     Labs: BNP (last 3 results) No results for input(s): BNP in the last 8760 hours. Basic Metabolic Panel:  Recent Labs Lab 01/26/17 0548 01/27/17 0855 01/28/17 0831 01/29/17 0726 01/30/17 0706  NA 138 137 137 139 139  K 4.6 4.3 4.4 4.1 3.9  CL 114* 113* 113* 113* 113*  CO2 18* 13* 16* 18* 19*  GLUCOSE 82 55* 76 76 98  BUN 17 21* 19 16 19   CREATININE 1.32* 1.26* 1.26* 1.18 1.22  CALCIUM 8.2* 8.3* 8.4* 8.4* 8.3*  MG  --   --   --  1.7 1.8   Liver Function Tests:  Recent Labs Lab 01/23/17 1345  AST 26  ALT 26  ALKPHOS 70  BILITOT  0.4  PROT 8.0  ALBUMIN 3.1*    Recent Labs Lab 01/23/17 1345  LIPASE 43  No results for input(s): AMMONIA in the last 168 hours. CBC:  Recent Labs Lab 01/25/17 0715 01/26/17 0548 01/28/17 0831 01/29/17 0726 01/30/17 0706  WBC 9.1 8.9 10.5 9.8 10.6*  NEUTROABS  --   --   --  7.2 7.7  HGB 11.3* 11.5* 11.8* 11.7* 11.9*  HCT 35.4* 36.1* 36.7* 35.6* 36.2*  MCV 85.5 85.1 83.6 83.6 83.6  PLT 227 238 244 242 242   Cardiac Enzymes: No results for input(s): CKTOTAL, CKMB, CKMBINDEX, TROPONINI in the last 168 hours. BNP: Invalid input(s): POCBNP CBG: No results for input(s): GLUCAP in the last 168 hours. D-Dimer No results for input(s): DDIMER in the last 72 hours. Hgb A1c No results for input(s): HGBA1C in the last 72 hours. Lipid Profile No results for input(s): CHOL, HDL, LDLCALC, TRIG, CHOLHDL, LDLDIRECT in the last 72 hours. Thyroid function studies No results for input(s): TSH, T4TOTAL, T3FREE, THYROIDAB in the last 72 hours.  Invalid input(s): FREET3 Anemia work up No results for input(s): VITAMINB12, FOLATE, FERRITIN, TIBC, IRON, RETICCTPCT in the last 72 hours. Urinalysis    Component Value Date/Time   COLORURINE STRAW (A) 10/07/2016 1808   APPEARANCEUR CLEAR 10/07/2016 1808   LABSPEC 1.010 10/07/2016 1808   PHURINE 5.0 10/07/2016 1808   GLUCOSEU 50 (A) 10/07/2016 1808   HGBUR SMALL (A) 10/07/2016 1808   HGBUR negative 02/01/2010 1022   BILIRUBINUR NEGATIVE 10/07/2016 1808   BILIRUBINUR N 09/16/2016 1259   KETONESUR NEGATIVE 10/07/2016 1808   PROTEINUR NEGATIVE 10/07/2016 1808   UROBILINOGEN 0.2 09/16/2016 1259   UROBILINOGEN 0.2 02/01/2010 1022   NITRITE NEGATIVE 10/07/2016 1808   LEUKOCYTESUR NEGATIVE 10/07/2016 1808   Sepsis Labs Invalid input(s): PROCALCITONIN,  WBC,  LACTICIDVEN Microbiology Recent Results (from the past 240 hour(s))  Blood culture (routine x 2)     Status: None   Collection Time: 01/23/17  1:43 PM  Result Value Ref Range  Status   Specimen Description BLOOD LEFT HAND  Final   Special Requests IN PEDIATRIC BOTTLE Blood Culture adequate volume  Final   Culture   Final    NO GROWTH 5 DAYS Performed at [facility], [place] [address], [place], [place] [address]    Report Status 01/28/2017 FINAL  Final  Blood culture (routine x 2)     Status: None   Collection Time: 01/23/17  1:45 PM  Result Value Ref Range Status   Specimen Description BLOOD RIGHT HAND  Final   Special Requests   Final    BOTTLES DRAWN AEROBIC AND ANAEROBIC Blood Culture adequate volume   Culture   Final    NO GROWTH 5 DAYS Performed at [facility], [place] [address] [place], [place] [address]    Report Status 01/28/2017 FINAL  Final  C difficile quick scan w PCR reflex     Status: Abnormal   Collection Time: 01/25/17  9:10 AM  Result Value Ref Range Status   C Diff antigen POSITIVE (A) NEGATIVE Final    Comment: RESULT CALLED TO, READ BACK BY AND VERIFIED WITH: CAUDLE,E AT 1100 ON [id number] BY HOOKER,B    C Diff toxin NEGATIVE NEGATIVE Final   C Diff interpretation Results are indeterminate. See PCR results.  Final  Clostridium Difficile by PCR     Status: Abnormal   Collection Time: 01/25/17  9:10 AM  Result Value Ref Range Status   Toxigenic C Difficile by pcr POSITIVE (A) NEGATIVE Final    Comment: Positive for toxigenic C. difficile with  little to no toxin production. Only treat if clinical presentation suggests symptomatic illness. Performed at [facility], [place] [address] [place], [place] [address]   Aerobic/Anaerobic Culture (surgical/deep wound)     Status: None (Preliminary result)   Collection Time: 01/26/17  9:36 AM  Result Value Ref Range Status   Specimen Description ABSCESS LLQ DIVERTICULAR  Final   Special Requests NONE  Final   Gram Stain   Final    MODERATE WBC PRESENT, PREDOMINANTLY PMN NO ORGANISMS SEEN    Culture   Final    CULTURE REINCUBATED FOR BETTER GROWTH Performed at [facility], [place] [address], [place], [place] [address]    Report Status PENDING  Incomplete     Time coordinating discharge: 35 minutes  SIGNED:   Aline August, MD  [facility] 01/30/2017, 10:09 AM Pager: [phone]  If 7PM-7AM, please contact night-coverage [email or web address] Password [email or web address]

## 2017-01-30 NOTE — Progress Notes (Signed)
Discharge instructions with prescriptions given to pt wife. Wife verbalized understanding.

## 2017-01-30 NOTE — Progress Notes (Signed)
Foley removed at 1230. Condom cath applied and pt has ingested significant amount of oral liquids and still no ability or sensation to void. Pt does straight cath at home twice a day. Wife requests to be d/c and will straight cath before bedtime if pt does not void independently.

## 2017-01-30 NOTE — Progress Notes (Signed)
[facility] Progress Note     Subjective: CC: Wants to get home Tolerating soft diet without n/v, abdominal pain. Diarrhea decreased in frequency, no BM yet today and only 3 yesterday.   Objective: Vital signs in last 24 hours: Temp:  [97.5 F (36.4 C)-98.3 F (36.8 C)] 97.5 F (36.4 C) (06/22 0457) Pulse Rate:  [73-76] 76 (06/22 0457) Resp:  [17-18] 18 (06/22 0457) BP: (114-141)/(54-62) 115/54 (06/22 0457) SpO2:  [96 %-98 %] 96 % (06/22 0457) Last BM Date: 01/29/17  Intake/Output from previous day: 06/21 0701 - 06/22 0700 In: 243 [P.O.:240] Out: 1600 [Urine:1600] Intake/Output this shift: No intake/output data recorded.  PE: Gen:  Alert, NAD, pleasant Card:  Regular rate and rhythm, no M/G/R Pulm:  Normal effort, clear to auscultation bilaterally Abd: Soft, non-tender, non-distended, bowel sounds present in all 4 quadrants. Drain in LLQ with <10 cc serosanguinous drainage. Skin: warm and dry, no rashes  Psych: A&Ox3     Lab Results:   Recent Labs  01/29/17 0726 01/30/17 0706  WBC 9.8 10.6*  HGB 11.7* 11.9*  HCT 35.6* 36.2*  PLT 242 242   BMET  Recent Labs  01/29/17 0726 01/30/17 0706  NA 139 139  K 4.1 3.9  CL 113* 113*  CO2 18* 19*  GLUCOSE 76 98  BUN 16 19  CREATININE 1.18 1.22  CALCIUM 8.4* 8.3*   CMP     Component Value Date/Time   NA 139 01/30/2017 0706   NA 135 (L) 11/11/2016 0913   K 3.9 01/30/2017 0706   K 4.6 11/11/2016 0913   CL 113 (H) 01/30/2017 0706   CO2 19 (L) 01/30/2017 0706   CO2 17 (L) 11/11/2016 0913   GLUCOSE 98 01/30/2017 0706   GLUCOSE 105 11/11/2016 0913   BUN 19 01/30/2017 0706   BUN 42.8 (H) 11/11/2016 0913   CREATININE 1.22 01/30/2017 0706   CREATININE 1.5 (H) 11/11/2016 0913   CALCIUM 8.3 (L) 01/30/2017 0706   CALCIUM 9.4 11/11/2016 0913   PROT 8.0 01/23/2017 1345   PROT 7.7 11/11/2016 0913   ALBUMIN 3.1 (L) 01/23/2017 1345   ALBUMIN 3.1 (L) 11/11/2016 0913   AST 26 01/23/2017 1345   AST 14  11/11/2016 0913   ALT 26 01/23/2017 1345   ALT 18 11/11/2016 0913   ALKPHOS 70 01/23/2017 1345   ALKPHOS 63 11/11/2016 0913   BILITOT 0.4 01/23/2017 1345   BILITOT <0.22 11/11/2016 0913   GFRNONAA >60 01/30/2017 0706   GFRAA >60 01/30/2017 0706   Lipase     Component Value Date/Time   LIPASE 43 01/23/2017 1345    Anti-infectives: Anti-infectives    Start     Dose/Rate Route Frequency Ordered Stop   02/09/17 1100  rifaximin (XIFAXAN) tablet 400 mg     400 mg Oral 3 times daily 01/26/17 1006 02/23/17 0959   02/09/17 0000  rifaximin (XIFAXAN) 200 MG tablet     400 mg Oral 3 times daily 01/30/17 1007 02/23/17 2359   01/30/17 0000  cefUROXime (CEFTIN) 500 MG tablet     500 mg Oral 2 times daily with meals 01/30/17 1007     01/30/17 0000  metroNIDAZOLE (FLAGYL) 500 MG tablet     500 mg Oral Every 8 hours 01/30/17 1007     01/30/17 0000  vancomycin (VANCOCIN) 50 mg/mL oral solution     125 mg Oral 4 times daily 01/30/17 1007 02/09/17 2359   01/29/17 1400  metroNIDAZOLE (FLAGYL) tablet 500 mg  500 mg Oral Every 8 hours 01/29/17 1029     01/29/17 1200  cefUROXime (CEFTIN) tablet 500 mg     500 mg Oral 2 times daily with meals 01/29/17 1029     01/26/17 1100  vancomycin (VANCOCIN) 50 mg/mL oral solution 125 mg     125 mg Oral 4 times daily 01/26/17 1006 02/09/17 0959   01/25/17 1600  gentamicin (GARAMYCIN) 120 mg in dextrose 5 % 50 mL IVPB  Status:  Discontinued     120 mg 106 mL/hr over 30 Minutes Intravenous Every 12 hours 01/24/17 0624 01/25/17 1132   01/25/17 1400  ertapenem (INVANZ) 1 g in sodium chloride 0.9 % 50 mL IVPB  Status:  Discontinued     1 g 100 mL/hr over 30 Minutes Intravenous Every 24 hours 01/25/17 1132 01/29/17 1029   01/24/17 0200  ciprofloxacin (CIPRO) IVPB 400 mg  Status:  Discontinued     400 mg 200 mL/hr over 60 Minutes Intravenous Every 12 hours 01/23/17 2205 01/25/17 1132   01/23/17 2200  metroNIDAZOLE (FLAGYL) IVPB 500 mg  Status:  Discontinued      500 mg 100 mL/hr over 60 Minutes Intravenous Every 8 hours 01/23/17 2158 01/25/17 1132   01/23/17 1600  gentamicin (GARAMYCIN) 540 mg in dextrose 5 % 100 mL IVPB  Status:  Discontinued     540 mg 113.5 mL/hr over 60 Minutes Intravenous Every 24 hours 01/23/17 1444 01/24/17 0623   01/23/17 1430  ciprofloxacin (CIPRO) IVPB 400 mg     400 mg 200 mL/hr over 60 Minutes Intravenous  Once 01/23/17 1351 01/23/17 1510   01/23/17 1430  metroNIDAZOLE (FLAGYL) IVPB 500 mg     500 mg 100 mL/hr over 60 Minutes Intravenous  Once 01/23/17 1351 01/23/17 1611       Assessment/Plan Diverticulitis with focal abscess ` - IR placed percutaneous drain - (01/26/2017)- 20 cc output yesterday  - culture pending   -Hospitalized from 2/17-10/09/16 with sigmoid diverticulitis with perforation/C diff colitis  - home on PO flagyl and cefuroxime  C.Difficile diarrhea-PO vanc (6/17>>) x14 days total to be followed by PO rifaximin - Florastor BID Recurrent Metastatic renal cell cancer - s/p left radical nephrectomy 04/23/2012 - peritoneal mets 2015 - followed by Dr. 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   Will need to follow up with the drain clinic and will need to see Dr. Lucia Gaskins in about 2 weeks.   LOS: 7 days   Austin [name] , [facility] 01/30/2017, 10:13 AM Pager: [phone] Consults: [phone]  Agree with above.  Alphonsa Overall, MD, [facility] Pager: [phone] Office phone:  [phone]

## 2017-01-30 NOTE — Progress Notes (Signed)
Pt discharged from the unit via wheelchair. Discharge instructions were reviewed with the pt. Medication sent with wife. No questions or concerns at this time.  Austin [name] W Marieelena Bartko, RN

## 2017-01-30 NOTE — Discharge Instructions (Signed)
Percutaneous Abscess Drain, Care After This sheet gives you information about how to care for yourself after your procedure. Your health care provider may also give you more specific instructions. If you have problems or questions, contact your health care provider. What can I expect after the procedure? After your procedure, it is common to have:  A small amount of bruising and discomfort in the area where the drainage tube (catheter) was placed.  Sleepiness and fatigue. This should go away after the medicines you were given have worn off.  Follow these instructions at home: Incision care  Follow instructions from your health care provider about how to take care of your incision. Make sure you: ? Wash your hands with soap and water before you change your bandage (dressing). If soap and water are not available, use hand sanitizer. ? Change your dressing as told by your health care provider. ? Leave stitches (sutures), skin glue, or adhesive strips in place. These skin closures may need to stay in place for 2 weeks or longer. If adhesive strip edges start to loosen and curl up, you may trim the loose edges. Do not remove adhesive strips completely unless your health care provider tells you to do that.  Check your incision area every day for signs of infection. Check for: ? More redness, swelling, or pain. ? More fluid or blood. ? Warmth. ? Pus or a bad smell. ? Fluid leaking from around your catheter (instead of fluid draining through your catheter). Catheter care  Follow instructions from your health care provider about emptying and cleaning your catheter and collection bag. You may need to clean the catheter every day so it does not clog.  If directed, write down the following information every time you empty your bag: ? The date and time. ? The amount of drainage. General instructions  Rest at home for 1-2 days after your procedure. Return to your normal activities as told by your  health care provider.  Do not take baths, swim, or use a hot tub for 24 hours after your procedure, or until your health care provider says that this is okay.  Take over-the-counter and prescription medicines only as told by your health care provider.  Keep all follow-up visits as told by your health care provider. This is important. Contact a health care provider if:   You have more redness, swelling, or pain around your incision area.  Your incision area feels warm to the touch.  You have pus or a bad smell coming from your incision area.  You have a fever or chills.  You have pain that does not get better with medicine. Get help right away if:  Your catheter comes out.  You suddenly stop having drainage from your catheter.  You become dizzy or you faint.  You develop a rash.  You have nausea or vomiting.  You have difficulty breathing or you feel short of breath.  You develop chest pain.  You have problems with your speech or vision.  You have trouble balancing or moving your arms or legs. Summary  It is common to have a small amount of bruising and discomfort in the area where the drainage tube (catheter) was placed.  You may be directed to record the amount of drainage from the bag every time you empty it.  Follow instructions from your health care provider about emptying and cleaning your catheter and collection bag. This information is not intended to replace advice given to you by your  health care provider. Make sure you discuss any questions you have with your health care provider. Document Released: 12/12/2013 Document Revised: 06/19/2016 Document Reviewed: 06/19/2016 Elsevier Interactive Patient Education  2017 [employer].

## 2017-01-30 NOTE — Progress Notes (Signed)
Referring Physician(s): Newman,D  Supervising Physician: Jacqulynn Cadet  Patient Status:  [facility] - In-pt  Chief Complaint:  Pelvic abscess  Subjective: Pt doing ok; wife in room; awaiting d/c home today   Allergies: Penicillins  Medications: Prior to Admission medications   Medication Sig Start Date End Date Taking? Authorizing Provider  albuterol (PROVENTIL HFA;VENTOLIN HFA) 108 (90 BASE) MCG/ACT inhaler Inhale 2 puffs into the lungs every 6 (six) hours as needed for wheezing or shortness of breath. 07/07/15  Yes Debbrah Alar, NP  atorvastatin (LIPITOR) 20 MG tablet Take 20 mg by mouth daily.   Yes [provider]  clopidogrel (PLAVIX) 75 MG tablet Take 75 mg by mouth at bedtime.   Yes [provider]  escitalopram (LEXAPRO) 10 MG tablet Take 10 mg by mouth daily.   Yes [provider]  ketoconazole (NIZORAL) 2 % cream APPLY AS NEEDED TO RASH Patient taking differently: APPLY AS NEEDED TO RASH daily 10/23/16  Yes [name], [name], MD  levothyroxine (SYNTHROID, LEVOTHROID) 25 MCG tablet Take 1 tablet (25 mcg total) by mouth daily before breakfast. 12/23/16  Yes [name], [name], MD  lisinopril-hydrochlorothiazide (PRINZIDE,ZESTORETIC) 20-12.5 MG tablet Take 1 tablet by mouth daily.   Yes [provider]  Multiple Vitamin (MULTIVITAMIN WITH MINERALS) TABS tablet Take 1 tablet by mouth daily.   Yes [provider]  testosterone enanthate (DELATESTRYL) 200 MG/ML injection Inject 200 mg into the muscle every 14 (fourteen) days. For IM use only   Yes [provider]  traMADol (ULTRAM) 50 MG tablet Take 50 mg by mouth every 6 (six) hours as needed for moderate pain.   Yes [provider]  traZODone (DESYREL) 100 MG tablet Take 100 mg by mouth at bedtime as needed for sleep.   Yes [provider]  triamcinolone cream (KENALOG) 0.1 % Apply 1 application topically 2 (two) times daily as needed (for rash).    Yes [provider]  trimethoprim (TRIMPEX) 100 MG tablet Restart this after you have completed the Bactrim and Flagyl course. 10/09/16  Yes [name], PA-C  cefUROXime (CEFTIN) 500 MG tablet Take 1 tablet (500 mg total) by mouth 2 (two) times daily with a meal. 01/30/17   Aline August, MD  metroNIDAZOLE (FLAGYL) 500 MG tablet Take 1 tablet (500 mg total) by mouth every 8 (eight) hours. 01/30/17   Aline August, MD  nystatin (MYCOSTATIN) 100000 UNIT/ML suspension Take 5 mLs (500,000 Units total) by mouth 4 (four) times daily. 01/30/17   Aline August, MD  rifaximin (XIFAXAN) 200 MG tablet Take 2 tablets (400 mg total) by mouth 3 (three) times daily. 02/09/17 02/23/17  Aline August, MD  saccharomyces boulardii (FLORASTOR) 250 MG capsule Take 1 capsule (250 mg total) by mouth 2 (two) times daily. 01/30/17   Aline August, MD  vancomycin (VANCOCIN) 50 mg/mL oral solution Take 2.5 mLs (125 mg total) by mouth 4 (four) times daily. 01/30/17 02/09/17  Aline August, MD     Vital Signs: BP (!) 115/54 (BP Location: Left Arm)   Pulse 76   Temp 97.5 F (36.4 C) (Oral)   Resp 18   Ht '6\' 1"'  (1.854 m)   Wt 173 lb 1 oz (78.5 kg)   SpO2 96%   BMI 22.83 kg/m   Physical Exam LLQ drain intact, insertion site ok, output few cc's bloody fluid; cx-pend  Imaging: No results found.  Labs:  CBC:  Recent Labs  01/26/17 0548 01/28/17 0831 01/29/17 2025 01/30/17 4270  WBC 8.9 10.5 9.8 10.6*  HGB 11.5* 11.8* 11.7* 11.9*  HCT 36.1* 36.7* 35.6* 36.2*  PLT 238 244 242 242    COAGS:  Recent Labs  10/02/16 2353  INR 1.32    BMP:  Recent Labs  01/27/17 0855 01/28/17 0831 01/29/17 0726 01/30/17 0706  NA 137 137 139 139  K 4.3 4.4 4.1 3.9  CL 113* 113* 113* 113*  CO2 13* 16* 18* 19*  GLUCOSE 55* 76 76 98  BUN 21* '19 16 19  ' CALCIUM 8.3* 8.4* 8.4* 8.3*  CREATININE 1.26* 1.26* 1.18 1.22  GFRNONAA 59* 59* >60 >60  GFRAA >60 >60 >60 >60    LIVER FUNCTION TESTS:  Recent  Labs  09/27/16 1823 10/08/16 0442 11/11/16 0913 01/23/17 1345  BILITOT 0.6 0.5 <0.22 0.4  AST 20 13* 14 26  ALT 27 12* 18 26  ALKPHOS 70 50 63 70  PROT 7.9 5.7* 7.7 8.0  ALBUMIN 3.5 2.3* 3.1* 3.1*    Assessment and Plan: S/p drainage of recurrent LLQ diverticular abscess 6/18; also C diff positive/met RCC; drain fluid cx pend; AF; WBC 10.6, hgb 11.9, creat 1.22; will schedule pt for f/u CT /poss drain injection at IR clinic next week   Electronically Signed: D. Rowe Robert, PA-C 01/30/2017, 12:11 PM   I spent a total of 15 minutes at the the patient's bedside AND on the patient's hospital floor or unit, greater than 50% of which was counseling/coordinating care for pelvic abscess drain    Patient ID: Austin [name], male   DOB: [birth year], 61 y.o.   MRN: [id number]

## 2017-02-02 ENCOUNTER — Other Ambulatory Visit: Payer: Self-pay

## 2017-02-02 ENCOUNTER — Other Ambulatory Visit: Payer: Self-pay | Admitting: Surgery

## 2017-02-02 DIAGNOSIS — K5720 Diverticulitis of large intestine with perforation and abscess without bleeding: Secondary | ICD-10-CM

## 2017-02-02 NOTE — Patient Outreach (Signed)
[facility]) Care Management  02/02/2017  Austin [name] 05/14/1956 [id number]   Subjective: Austin [name] reports client is "just a litter weaker today".  Objective: none  Assessment: 61 year old with recent admission 6/15-6/22 due to diverticulitis, Intra-abdominal abscess. 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  RNCM called for transition of care. RNCM spoke with both client and his wife. Client referred to [facility] to speak with his wife, although Austin [name] confirmed that the call was on speaker so that Austin [name] was a part of the conversation. Transition of care call completed.  Austin [name] reports client is a little weaker today than on Friday. Austin [name] denies feeling any different just a little tired adding he may have just "overdone" it today. Austin [name] also reports that levothyroxine was on hole prior to client's admission to the hospital adding she was supposed to follow up with primary care, but client was in the hospital at the time. RNCM encouraged Austin [name] to schedule appointment with primary care. RNCM encouraged Austin [name] that is client's weakness did not improve or if he got weaker to notify primary care sooner than scheduled appointment.   Austin [name] reports she decline Home health services. RNCM informed that if they thought they needed home health that services could still be arranged by primary care.   Drain discussed-client to have follow up imaging this week. Austin [name] reports she has spoken with Austin [name] office who will discuss follow up appointment date with Austin [name].   Rifaximin- Austin [name] with questions regarding the prior authorization process for rifaxamin. Austin [name] is to begin on 02/09/17. RNCM will refer to Austin [name] pharmacist to follow up.  RNCM encouraged Austin [name] to call as needed. RNCM encouraged Austin [name] to call the 24 hour nurse advice line as needed.  Austin Silversmith,  RN, MSN, [place] Coordinator Cell: [phone]

## 2017-02-02 NOTE — Patient Outreach (Signed)
[facility]) Care Management  02/02/2017  TAESHAUN RAMES [birth year] [id number]   Subjective: none  Objective: none  Assessment: 61 year old with recent admission 6/15-6/22 due to diverticulitis, Intra-abdominal abscess. 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  RNCM called to complete transition of care. No answer. HIPPA compliant message left.  Thea Silversmith, RN, MSN, [place] Coordinator Cell: [phone]

## 2017-02-04 ENCOUNTER — Telehealth: Payer: Self-pay | Admitting: Pharmacist

## 2017-02-04 ENCOUNTER — Telehealth: Payer: Self-pay | Admitting: Family Medicine

## 2017-02-04 DIAGNOSIS — I10 Essential (primary) hypertension: Secondary | ICD-10-CM | POA: Diagnosis not present

## 2017-02-04 DIAGNOSIS — I6789 Other cerebrovascular disease: Secondary | ICD-10-CM | POA: Diagnosis not present

## 2017-02-04 NOTE — Telephone Encounter (Signed)
-----   Message from Jiles Harold sent at 02/03/2017  8:35 AM EDT ----- Regarding: Referral: Order for Denyse Dago  Referral from Thea Silversmith, RN  "Please see below request"  Forde Radon ----- Message ----- From: Luretha Rued, RN Sent: 02/02/2017   4:40 PM To: Thn Cm Communication Orders Subject: Order for DENVIL, CANNING                      Patient Name: Austin [name], Austin [name] N([id number]) Sex: Male DOB: 08/12/1955    PCP: [name]   Center: [place]   Types of orders made on 02/02/2017: Nursing  Order Date:02/02/2017 Ordering Urban Gibson [[id number]] Encounter Provider:Wallace, Deno Etienne, RN [[id number]] Authorizing Provider: [name], MD [phone] Department:[facility][[id number]]  Order Specific Information Order: Comm to Pharmacy [Custom: [id number]]  Order #: [id number] Qty: 1   Priority: Routine  Class: Clinic Performed   Comment:Please assign to pharmacist,                        Client is ordered rifaximin to be started on 02/09/17 following             another antibiotic. Mrs. Frankson was told by their pharmacist that             prior authorization is required, but she is not clear on how this             process works. Can you assist in this so that he will have the             medication when it is supposed to start. Mrs. Vantol manages his             medications and all his care. Thank you.                        Thea Silversmith, RN, MSN, [place] Coordinator            Cell: [phone]     Priority: Routine  Class: Clinic Performed   Comment:Please assign to pharmacist,                        Client is ordered rifaximin to be started on 02/09/17 following             another antibiotic. Mrs. Mczeal was told by their pharmacist that             prior authorization is required, but she is not clear on how this             process works. Can you assist in this so that he will have the             medication when it is supposed to start. Mrs. Vora manages his             medications and all his care. Thank you.                        Thea Silversmith, RN, MSN, [place] Coordinator            Cell: [phone]

## 2017-02-04 NOTE — Patient Outreach (Addendum)
[facility]) Care Management  02/04/2017  KENI ELISON October 01, 1955 [id number]  Patient was called regarding prior authorization required on Rifampin as referred by Thea Silversmith.  Unfortunately the patient did not answer the phone. HIPAA compliant message was left on his voicemail.  [facility] was called and the patient's chart was reviewed.  Patient went to the ED 01/30/17  and was discharged on Xifaxan 200mg  2 tablets three times daily with a therapy start date of 02/09/17.  [facility] Pharmacist, Jenny Reichmann reported a prior authorization request had been sent to the prescribing provider:  Dr. Aline August at [facility] but had not heard back.  It is not an uncommon occurrence for the hospital physicians to not respond to prior authorizations as it is unclear what number the pharmacy faxed, and how faxes are distributed.    Since the patient needs to start therapy on 02/09/17, his PCP (Dr. Darnell Level [name]'s office) was called.  The front office staff person I spoke with took a message but said she did not know if Dr. [name] would get involved with the prior authorization since he did not prescribe the medication.  I left my number for a call back.  Patient has a follow up appointment with Dr. [name] 02/06/17  Patient's medications were reviewed via telephone. Patient's wife denied any other issues with medications and said she manages the patient's medications without any issues.  Medications Reviewed Today    Reviewed by Elayne Guerin, [facility] (Pharmacist) on 02/04/17 at 1455  Med List Status: <None>  Medication Order Taking? Sig Documenting Provider Last Dose Status Informant  albuterol (PROVENTIL HFA;VENTOLIN HFA) 108 (90 BASE) MCG/ACT inhaler [id number] Yes Inhale 2 puffs into the lungs every 6 (six) hours as needed for wheezing or shortness of breath. Debbrah Alar, NP Taking Active Spouse/Significant Other  atorvastatin (LIPITOR) 20 MG tablet [id number]  Take  20 mg by mouth daily. [provider]  Active Spouse/Significant Other  cefUROXime (CEFTIN) 500 MG tablet [id number]  Take 1 tablet (500 mg total) by mouth 2 (two) times daily with a meal. Starla Link, Kshitiz, MD  Active   clopidogrel (PLAVIX) 75 MG tablet [id number]  Take 75 mg by mouth at bedtime. [provider]  Active Spouse/Significant Other  escitalopram (LEXAPRO) 10 MG tablet [id number]  Take 10 mg by mouth daily. [provider]  Active Spouse/Significant Other  ketoconazole (NIZORAL) 2 % cream [id number]  APPLY AS NEEDED TO RASH  Patient taking differently:  APPLY AS NEEDED TO RASH daily   [name], [name], MD  Active Spouse/Significant Other  levothyroxine (SYNTHROID, LEVOTHROID) 25 MCG tablet [id number]  Take 1 tablet (25 mcg total) by mouth daily before breakfast.  Patient not taking:  Reported on 02/02/2017   [name], MD  Active Spouse/Significant Other  lisinopril-hydrochlorothiazide (PRINZIDE,ZESTORETIC) 20-12.5 MG tablet [id number]  Take 1 tablet by mouth daily. [provider]  Active Spouse/Significant Other  metroNIDAZOLE (FLAGYL) 500 MG tablet [id number]  Take 1 tablet (500 mg total) by mouth every 8 (eight) hours. Aline August, MD  Active   Multiple Vitamin (MULTIVITAMIN WITH MINERALS) TABS tablet [id number]  Take 1 tablet by mouth daily. [provider]  Active Spouse/Significant Other  nystatin (MYCOSTATIN) 100000 UNIT/ML suspension [id number]  Take 5 mLs (500,000 Units total) by mouth 4 (four) times daily. Aline August, MD  Active   rifaximin (XIFAXAN) 200 MG tablet [id number]  Take 2 tablets (400 mg total) by mouth 3 (three) times daily.  Patient not taking:  Reported on 02/02/2017   Aline August, MD  Active   saccharomyces boulardii (FLORASTOR) 250 MG capsule [id number]  Take 1 capsule (250 mg total) by mouth 2 (two) times daily. Aline August, MD  Active   testosterone enanthate (DELATESTRYL) 200 MG/ML injection [id number]   Inject 200 mg into the muscle every 14 (fourteen) days. For IM use only [provider]  Active Spouse/Significant Other  traMADol (ULTRAM) 50 MG tablet [id number]  Take 50 mg by mouth every 6 (six) hours as needed for moderate pain. [provider]  Active Spouse/Significant Other  traZODone (DESYREL) 100 MG tablet [id number]  Take 100 mg by mouth at bedtime as needed for sleep. [provider]  Active Spouse/Significant Other  triamcinolone cream (KENALOG) 0.1 % [id number]  Apply 1 application topically 2 (two) times daily as needed (for rash). [provider]  Active Spouse/Significant Other  vancomycin (VANCOCIN) 50 mg/mL oral solution [id number]  Take 2.5 mLs (125 mg total) by mouth 4 (four) times daily. Aline August, MD  Active            Plan:  Route note to Dr. [name]  Await call back from patient  Call patient back if I do not hear back tomorrow.   Elayne Guerin, PharmD, BCACP [facility] Clinical Pharmacist [phone]   ADDENDUM  Patient's wife, Peter Congo, called me back. The information I gathered and steps taken were shared with the patient's wife.  She communicated understanding and said they would mention the prior authorization to Dr. [name] at the patient's office visit on 02/06/17.     2ND ADDENDUM  Sarah from Dr. Erick Blinks office called me back and said their office would handle the prior authorization for the patient.  Patient's wife was called back and informed.  Plan:  Close pharmacy case as the patient's wife manages his medications without issue and the original referral problem has been solved.  Patient's wife was given my contact information and understands she can call with future medication issues or concerns.  Elayne Guerin, PharmD, [place] Clinical Pharmacist [phone]

## 2017-02-04 NOTE — Telephone Encounter (Signed)
I left a voicemail for Austin [name] letting her know that we will do the prior authorization.  The prior authorization has been completed & is pending. Key: [id number]

## 2017-02-04 NOTE — Telephone Encounter (Signed)
° ° °  Katina with [facility] care management call to ask if we will do the  PA for the below med that was ordered from a doctor at the hospital     rifaximin (XIFAXAN) 200 MG tablet   Alwyn Ren asked for a call back  [phone]

## 2017-02-05 ENCOUNTER — Ambulatory Visit
Admission: RE | Admit: 2017-02-05 | Discharge: 2017-02-05 | Disposition: A | Discharge status: Home / Self Care | Payer: PPO | Source: Ambulatory Visit | Attending: Surgery | Admitting: Surgery

## 2017-02-05 ENCOUNTER — Ambulatory Visit
Admission: RE | Admit: 2017-02-05 | Discharge: 2017-02-05 | Disposition: A | Discharge status: Home / Self Care | Payer: PPO | Source: Ambulatory Visit | Attending: Radiology | Admitting: Radiology

## 2017-02-05 ENCOUNTER — Other Ambulatory Visit: Payer: Self-pay | Admitting: Surgery

## 2017-02-05 DIAGNOSIS — K5732 Diverticulitis of large intestine without perforation or abscess without bleeding: Secondary | ICD-10-CM | POA: Diagnosis not present

## 2017-02-05 DIAGNOSIS — K5720 Diverticulitis of large intestine with perforation and abscess without bleeding: Secondary | ICD-10-CM

## 2017-02-05 DIAGNOSIS — T85898A Other specified complication of other internal prosthetic devices, implants and grafts, initial encounter: Secondary | ICD-10-CM | POA: Diagnosis not present

## 2017-02-05 HISTORY — PX: IR RADIOLOGIST EVAL & MGMT: IMG5224

## 2017-02-05 MED ORDER — IOPAMIDOL (ISOVUE-300) INJECTION 61%
100.0000 mL | Freq: Once | INTRAVENOUS | Status: AC | PRN
  Administered 2017-02-05: 100 mL via INTRAVENOUS

## 2017-02-05 NOTE — Progress Notes (Signed)
Referring Physician(s): Dr Nydia Bouton  Chief Complaint: The patient is seen in follow up today s/p  01/26/17:  1. Technically successful CT-guided left lower quadrant percutaneous abscess drain catheter placement.   History of present illness:  Pt with Hx metastatic renal cell cancer Diverticular abscess 09/2016 with abscess drain placement. Recurrence 01/26/17 Doing  well Denies fever/chills/pain. Flushing 3 cc daily per wife. Output is minimal with 10 cc in bag now -- collection of over 24 hrs. Pt has appt with Dr Lucia Gaskins July 10  CT today shows resolution of abscess Injection does reveal small tickle of contrast to bowel per Dr Laurence Ferrari.  Past Medical History:  Diagnosis Date  . Cerebrovascular accident ([place]) 1994   hx of brainstem stroke, residual diminished lung capacity  . Chronic kidney disease    Left Renal Mass  . Diabetes mellitus without complication ([place])   . Hx of colonic polyps   . Hypertension   . Hypogonadism   . 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 Pituitary adenoma ([place])   . 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 Sleep apnea    stopbang=4  . Urinary incontinence   . Venous stasis    edema    Past Surgical History:  Procedure Laterality Date  . CHOLECYSTECTOMY    . COLONOSCOPY  02/27/2012   Procedure: COLONOSCOPY;  Surgeon: Jerene Bears, MD;  Location: [facility];  Service: Gastroenterology;  Laterality: N/A;  . gamma knife radiation surgery  05/2010   for pituitary  . PITUITARY SURGERY  2007, 2011  . ROBOT ASSISTED LAPAROSCOPIC NEPHRECTOMY  04/23/2012   Procedure: ROBOTIC ASSISTED LAPAROSCOPIC NEPHRECTOMY;  Surgeon: Alexis Frock, MD;  Location: [facility];  Service: Urology;  Laterality: Left;  radical  . Robotic Partial Nephrectomy  04-23-12   left   Left Renal Mass  . stomach peg  02/1993   removed 5-6 years later    . TRACHEOSTOMY TUBE PLACEMENT  02/1993   removed 04/1993  . UMBILICAL HERNIA REPAIR  04/23/2012   Procedure: HERNIA REPAIR UMBILICAL ADULT;  Surgeon: Alexis Frock, MD;  Location: [facility];  Service: Urology;;  . [name]  . vocal cord surgery     injected with collagen, then fat from stomach to improve speech s/p stroke    Allergies: Penicillins  Medications: Prior to Admission medications   Medication Sig Start Date End Date Taking? Authorizing Provider  albuterol (PROVENTIL HFA;VENTOLIN HFA) 108 (90 BASE) MCG/ACT inhaler Inhale 2 puffs into the lungs every 6 (six) hours as needed for wheezing or shortness of breath. 07/07/15   Debbrah Alar, NP  atorvastatin (LIPITOR) 20 MG tablet Take 20 mg by mouth daily.    [provider]  cefUROXime (CEFTIN) 500 MG tablet Take 1 tablet (500 mg total) by mouth 2 (two) times daily with a meal. 01/30/17   Aline August, MD  clopidogrel (PLAVIX) 75 MG tablet Take 75 mg by mouth at bedtime.    [provider]  escitalopram (LEXAPRO) 10 MG tablet Take 10 mg by mouth daily.    [provider]  ketoconazole (NIZORAL) 2 % cream APPLY AS NEEDED TO RASH Patient taking differently: APPLY AS NEEDED TO RASH daily 10/23/16   [name], [name], MD  levothyroxine (SYNTHROID, LEVOTHROID) 25 MCG tablet Take 1 tablet (25 mcg total) by mouth daily before breakfast. Patient not taking: Reported  on 02/04/2017 12/23/16   [name], MD  lisinopril-hydrochlorothiazide (PRINZIDE,ZESTORETIC) 20-12.5 MG tablet Take 1 tablet by mouth daily.    [provider]  metroNIDAZOLE (FLAGYL) 500 MG tablet Take 1 tablet (500 mg total) by mouth every 8 (eight) hours. 01/30/17   Aline August, MD  Multiple Vitamin (MULTIVITAMIN WITH MINERALS) TABS tablet Take 1 tablet by mouth daily.    [provider]  nystatin (MYCOSTATIN) 100000 UNIT/ML suspension Take 5 mLs (500,000 Units total) by mouth 4 (four) times daily. 01/30/17    Aline August, MD  rifaximin (XIFAXAN) 200 MG tablet Take 2 tablets (400 mg total) by mouth 3 (three) times daily. 02/09/17 02/23/17  Aline August, MD  saccharomyces boulardii (FLORASTOR) 250 MG capsule Take 1 capsule (250 mg total) by mouth 2 (two) times daily. 01/30/17   Aline August, MD  testosterone enanthate (DELATESTRYL) 200 MG/ML injection Inject 200 mg into the muscle every 14 (fourteen) days. For IM use only    [provider]  traMADol (ULTRAM) 50 MG tablet Take 50 mg by mouth every 6 (six) hours as needed for moderate pain.    [provider]  traZODone (DESYREL) 100 MG tablet Take 100 mg by mouth at bedtime as needed for sleep.    [provider]  triamcinolone cream (KENALOG) 0.1 % Apply 1 application topically 2 (two) times daily as needed (for rash).    [provider]  vancomycin (VANCOCIN) 50 mg/mL oral solution Take 2.5 mLs (125 mg total) by mouth 4 (four) times daily. 01/30/17 02/09/17  Aline August, MD     Family History  Problem Relation Age of Onset  . Alcohol abuse Father   . Throat cancer Father   . Esophageal cancer Father 83  . Arthritis Mother   . Hyperlipidemia Mother   . Uterine cancer Mother 65  . Colon cancer Brother   . Arthritis Maternal Grandmother   . Hypertension Brother   . Stroke Paternal Grandmother     Social History   Social History  . Marital status: Married    Spouse name: N/A  . Number of children: 3  . Years of education: N/A   Occupational History  . disabled Unemployed   Social History Main Topics  . Smoking status: Former Smoker    Packs/day: 0.50    Years: 23.00    Types: Cigarettes    Quit date: 09/24/1992  . Smokeless tobacco: Never Used  . Alcohol use No     Comment: rarley  . Drug use: No  . Sexual activity: Not on file   Other Topics Concern  . Not on file   Social History Narrative   Retired, disabled [place]     Vital Signs: BP: 144/87; T 97.8; P 72; O2 sat%: 98  RA  Physical Exam  Abdominal: Soft.  Neurological: He is alert.  Skin: Skin is warm and dry.  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   Imaging: No results found.  Labs:  CBC:  Recent Labs  01/26/17 0548 01/28/17 0831 01/29/17 0726 01/30/17 0706  WBC 8.9 10.5 9.8 10.6*  HGB 11.5* 11.8* 11.7* 11.9*  HCT 36.1* 36.7* 35.6* 36.2*  PLT 238 244 242 242    COAGS:  Recent Labs  10/02/16 2353  INR 1.32    BMP:  Recent Labs  01/27/17  2174 01/28/17 0831 01/29/17 0726 01/30/17 0706  NA 137 137 139 139  K 4.3 4.4 4.1 3.9  CL 113* 113* 113* 113*  CO2 13* 16* 18* 19*  GLUCOSE 55* 76 76 98  BUN 21* '19 16 19  ' CALCIUM 8.3* 8.4* 8.4* 8.3*  CREATININE 1.26* 1.26* 1.18 1.22  GFRNONAA 59* 59* >60 >60  GFRAA >60 >60 >60 >60    LIVER FUNCTION TESTS:  Recent Labs  09/27/16 1823 10/08/16 0442 11/11/16 0913 01/23/17 1345  BILITOT 0.6 0.5 <0.22 0.4  AST 20 13* 14 26  ALT 27 12* 18 26  ALKPHOS 70 50 63 70  PROT 7.9 5.7* 7.7 8.0  ALBUMIN 3.5 2.3* 3.1* 3.1*    Assessment:  Diverticular abscess Drain intact CT shows resolution but injection does reveal 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 Will discontinue flushes and return to clinic 1 week for injection. Pt and wife aware of result and are agreeable to plan. To keep appt with Dr Lucia Gaskins July 10   Signed: Zaryia Markel A, PA-C 02/05/2017, 2:42 PM   Please refer to Dr. Laurence Ferrari attestation of this note for management and plan.

## 2017-02-06 ENCOUNTER — Ambulatory Visit: Payer: PPO | Admitting: Pharmacist

## 2017-02-06 ENCOUNTER — Encounter: Payer: Self-pay | Admitting: Family Medicine

## 2017-02-06 ENCOUNTER — Ambulatory Visit (INDEPENDENT_AMBULATORY_CARE_PROVIDER_SITE_OTHER): Payer: PPO | Admitting: Family Medicine

## 2017-02-06 VITALS — BP 140/80 | HR 84 | Temp 98.4°F

## 2017-02-06 DIAGNOSIS — K5720 Diverticulitis of large intestine with perforation and abscess without bleeding: Secondary | ICD-10-CM

## 2017-02-06 DIAGNOSIS — A0472 Enterocolitis due to Clostridium difficile, not specified as recurrent: Secondary | ICD-10-CM

## 2017-02-06 DIAGNOSIS — G8250 Quadriplegia, unspecified: Secondary | ICD-10-CM | POA: Diagnosis not present

## 2017-02-06 DIAGNOSIS — I10 Essential (primary) hypertension: Secondary | ICD-10-CM

## 2017-02-06 DIAGNOSIS — K651 Peritoneal abscess: Secondary | ICD-10-CM

## 2017-02-06 DIAGNOSIS — C649 Malignant neoplasm of unspecified kidney, except renal pelvis: Secondary | ICD-10-CM | POA: Diagnosis not present

## 2017-02-06 LAB — CBC WITH DIFFERENTIAL/PLATELET
BASOS PCT: 1 %
Basophils Absolute: 100 cells/uL (ref 0–200)
EOS PCT: 5 %
Eosinophils Absolute: 500 cells/uL (ref 15–500)
HCT: 36.6 % — ABNORMAL LOW (ref 38.5–50.0)
Hemoglobin: 11.9 g/dL — ABNORMAL LOW (ref 13.2–17.1)
Lymphocytes Relative: 13 %
Lymphs Abs: 1300 cells/uL (ref 850–3900)
MCH: 27.4 pg (ref 27.0–33.0)
MCHC: 32.5 g/dL (ref 32.0–36.0)
MCV: 84.1 fL (ref 80.0–100.0)
MONOS PCT: 10 %
MPV: 9.6 fL (ref 7.5–12.5)
Monocytes Absolute: 1000 cells/uL — ABNORMAL HIGH (ref 200–950)
NEUTROS ABS: 7100 {cells}/uL (ref 1500–7800)
Neutrophils Relative %: 71 %
PLATELETS: 357 10*3/uL (ref 140–400)
RBC: 4.35 MIL/uL (ref 4.20–5.80)
RDW: 17.1 % — ABNORMAL HIGH (ref 11.0–15.0)
WBC: 10 10*3/uL (ref 3.8–10.8)

## 2017-02-06 LAB — SUSCEPTIBILITY, AER + ANAEROB

## 2017-02-06 LAB — SUSCEPTIBILITY RESULT

## 2017-02-06 NOTE — Telephone Encounter (Signed)
Prior authorization has been approved, form faxed back to pharmacy and [place] notified.

## 2017-02-06 NOTE — Progress Notes (Signed)
Subjective:     Patient ID: Austin [name], male   DOB: 12/18/1955, 61 y.o.   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   He had evidence for diverticular abscess back in February and was admitted recently on June 15 with some abdominal pain. He was seen by gastrologist who ordered CT scan which showed inflammatory changes with likely progression of abscess. He was admitted for  evaluation and treatment. He was treated with broad-spectrum antibiotics and eventually had CT guided aspiration of abscess with drain placement by interventional radiology. They went back yesterday for drain reassessment per interventional radiology. He is scheduled to see surgeon July 10.  He completed antibiotics with Flagyl and Ceftin yesterday. Also had recent C. difficile colitis and is currently on vancomycin. Denies any abdominal pain. He's had poor appetite for some time. No nausea or vomiting. No fever. No chills. He was treated with Vancomycin 14 days and plan is to treat with rifaximin for 14 days for recurrent C. Difficile  He has mild hypothyroidism and was recently placed on Synthroid but had side effects he thinks from medication.  Past Medical History:  Diagnosis Date  . Cerebrovascular accident ([place]) 1994   hx of brainstem stroke, residual diminished lung capacity  . Chronic kidney disease    Left Renal Mass  . Diabetes mellitus without complication ([place])   . Hx of colonic polyps   . Hypertension   . Hypogonadism   . 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 Pituitary adenoma ([place])   . 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 Sleep  apnea    stopbang=4  . Urinary incontinence   . Venous stasis    edema   Past Surgical History:  Procedure Laterality Date  . CHOLECYSTECTOMY    . COLONOSCOPY  02/27/2012   Procedure: COLONOSCOPY;  Surgeon: Jerene Bears, MD;  [facility];  Service: Gastroenterology;  Laterality: N/A;  . gamma knife radiation surgery  05/2010   for pituitary  . PITUITARY SURGERY  2007, 2011  . ROBOT ASSISTED LAPAROSCOPIC NEPHRECTOMY  04/23/2012   Procedure: ROBOTIC ASSISTED LAPAROSCOPIC NEPHRECTOMY;  Surgeon: Alexis Frock, MD;  Location[facility];  Service: Urology;  Laterality: Left;  radical  . Robotic Partial Nephrectomy  04-23-12   left   Left Renal Mass  . stomach peg  02/1993   removed 5-6 years later  . TRACHEOSTOMY TUBE PLACEMENT  02/1993   removed 04/1993  . UMBILICAL HERNIA REPAIR  04/23/2012   Procedure: HERNIA REPAIR UMBILICAL ADULT;  Surgeon: Alexis Frock, MD;  Location[facility];  Service: Urology;;  . [name]  . vocal cord surgery     injected with collagen, then fat from stomach to improve speech s/p stroke    reports that he quit smoking about 24 years ago. His smoking use included Cigarettes. He has a 11.50 pack-year smoking history. He has never used smokeless tobacco. He reports that he does not drink alcohol or use drugs. family history includes Alcohol abuse in his father; Arthritis in his maternal grandmother and mother; Colon cancer in his brother; Esophageal cancer (age of onset: 21) in his father; Hyperlipidemia in his mother; Hypertension in his brother; Stroke  in his paternal grandmother; Throat cancer in his father; Uterine cancer (age of onset: 62) in his mother. Allergies  Allergen Reactions  . Penicillins Rash and Other (See Comments)    Has patient had a PCN reaction causing immediate rash, facial/tongue/throat swelling, SOB or lightheadedness with hypotension: No Has patient had a PCN reaction causing severe rash involving mucus membranes or skin  necrosis: No Has patient had a PCN reaction that required hospitalization No Has patient had a PCN reaction occurring within the last 10 years: No If all of the above answers are "NO", then may proceed with Cephalosporin use.     Review of Systems  Constitutional: Negative for chills and fever.  Eyes: Negative for visual disturbance.  Respiratory: Negative for cough, chest tightness and shortness of breath.   Cardiovascular: Negative for chest pain, palpitations and leg swelling.  Gastrointestinal: Negative for abdominal pain, nausea and vomiting.  Neurological: Negative for dizziness, syncope, weakness, light-headedness and headaches.  Psychiatric/Behavioral: Negative for confusion.       Objective:   Physical Exam  Constitutional: He appears well-developed and well-nourished.  HENT:  Mouth/Throat: Oropharynx is clear and moist.  Cardiovascular: Normal rate and regular rhythm.   Pulmonary/Chest: Effort normal and breath sounds normal. No respiratory distress. He has no wheezes. He has no rales.  Abdominal: Soft. There is no tenderness.  Has drain in place left lower abdomen  Musculoskeletal: He exhibits no edema.  Neurological: He is alert.       Assessment:     #1 intra-abdominal abscess with recent drain placement as above on broad-spectrum antibiotics  #2 recurrent C. 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 difficile and then start rifaximin for 14 days -Continue close follow-up with surgery as scheduled -Follow-up promptly for any fever, chills, or other concerns -keep routine follow up as scheduled.  [name] MD [facility] at [facility]

## 2017-02-07 LAB — AEROBIC/ANAEROBIC CULTURE W GRAM STAIN (SURGICAL/DEEP WOUND)

## 2017-02-07 LAB — BASIC METABOLIC PANEL
BUN: 29 mg/dL — ABNORMAL HIGH (ref 7–25)
CALCIUM: 8.5 mg/dL — AB (ref 8.6–10.3)
CHLORIDE: 107 mmol/L (ref 98–110)
CO2: 17 mmol/L — ABNORMAL LOW (ref 20–31)
CREATININE: 1.32 mg/dL — AB (ref 0.70–1.25)
Glucose, Bld: 148 mg/dL — ABNORMAL HIGH (ref 65–99)
Potassium: 4.7 mmol/L (ref 3.5–5.3)
Sodium: 135 mmol/L (ref 135–146)

## 2017-02-07 LAB — AEROBIC/ANAEROBIC CULTURE (SURGICAL/DEEP WOUND)

## 2017-02-08 DIAGNOSIS — A0472 Enterocolitis due to Clostridium difficile, not specified as recurrent: Secondary | ICD-10-CM | POA: Insufficient documentation

## 2017-02-08 HISTORY — DX: Enterocolitis due to Clostridium difficile, not specified as recurrent: A04.72

## 2017-02-09 ENCOUNTER — Telehealth: Payer: Self-pay | Admitting: Internal Medicine

## 2017-02-09 MED ORDER — VANCOMYCIN 50 MG/ML ORAL SOLUTION: 125.0000 mg | Freq: Four times a day (QID) | ORAL | 0 refills | Status: DC

## 2017-02-09 MED FILL — VANCOMYCIN 50MG/ML-ORAL SOL: 14 days supply | Qty: 140 | Fill #0

## 2017-02-09 NOTE — Telephone Encounter (Signed)
Patient's wife notified that Dr. Hilarie Fredrickson will review for additional orders

## 2017-02-09 NOTE — Telephone Encounter (Signed)
Dr. Henrene Pastor you are MD of the day.   Patient was discharged on 6/15 with c-diff with rx to complete vancomycin for 14 days then start xifaxan 200 mg BID for 14 days.  Patient's stool is still very loose he is having 1-2 stools a day.  Patient is not able to afford the 200 mg xifaxan, we do not carry samples of the 200 mg .  Wife is asking if they should continue on vancomycin to replace the xifaxan or could I sample the 550 mg BID for 14 days?   Discussed with Dr. Henrene Pastor.  Continue vancomycin for 1 week and Dr. Hilarie Fredrickson to determine course of therapy going forward.  Left message for patient to call back

## 2017-02-12 ENCOUNTER — Other Ambulatory Visit: Payer: Self-pay | Admitting: Surgery

## 2017-02-12 ENCOUNTER — Ambulatory Visit
Admission: RE | Admit: 2017-02-12 | Discharge: 2017-02-12 | Disposition: A | Discharge status: Home / Self Care | Payer: PPO | Source: Ambulatory Visit | Attending: Surgery | Admitting: Surgery

## 2017-02-12 ENCOUNTER — Other Ambulatory Visit: Payer: Self-pay

## 2017-02-12 DIAGNOSIS — Z9689 Presence of other specified functional implants: Secondary | ICD-10-CM | POA: Diagnosis not present

## 2017-02-12 DIAGNOSIS — K5720 Diverticulitis of large intestine with perforation and abscess without bleeding: Secondary | ICD-10-CM

## 2017-02-12 DIAGNOSIS — Z4689 Encounter for fitting and adjustment of other specified devices: Secondary | ICD-10-CM | POA: Diagnosis not present

## 2017-02-12 HISTORY — PX: IR RADIOLOGIST EVAL & MGMT: IMG5224

## 2017-02-12 MED ORDER — VANCOMYCIN 50 MG/ML ORAL SOLUTION: ORAL | 0 refills | Status: DC

## 2017-02-12 NOTE — Patient Outreach (Signed)
[facility]) Care Management  02/12/2017  AUDON HEYMANN 1956-02-20 [id number]  Subjective: per wife, they were at daughter's house with family. She reports client is still weak.  Objective: none  Assessment: 61 year old with recent admission 6/15-6/22 due to diverticulitis, Intra-abdominal abscess. 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  RNCM called regarding transition of care. RNCM spoke with wife, although client answered some questions. Mrs. Giannelli reports client is still weak. He is following up with appointments as scheduled. Client continues to have drain in place. He has an appointment next week with the surgeon.   Discussed medications-Mrs. Batterman states that rifaxamin was changed to decreasing Vancomycin dosing.   No other issues or concerns noted at this time. Client's daughter from out of town was visiting and he was spending time with family.  Plan: transition of care call next week.  Thea Silversmith, RN, MSN, [place] Coordinator Cell: [phone]

## 2017-02-12 NOTE — Telephone Encounter (Signed)
Would recommend that we pulse taper the vancomycin given second recurrence of C. Difficile He also will be seeing surgeon in about 5 days for his history of perforated diverticular abscess I do not see much benefit and rifaximin at this time He also may need ID referral for recurrent C. Difficile but will await surgical opinion re: colon surgery  Vancomycin pulsed-tapered regimen: 125 mg orally four times daily for 10 to 14 days (this has been completed), then 125 mg orally twice daily for 7 days, then 125 mg orally once daily for 7 days, then 125 mg orally every 2 or 3 days for 2 to 8 weeks, OR

## 2017-02-12 NOTE — Telephone Encounter (Signed)
Left message for pts wife to call back.  Called wife back and let her know Dr. Vena Rua recommendations. Scripts called to pharmacy.

## 2017-02-12 NOTE — Progress Notes (Signed)
Referring Physician(s):  Newman,David  Chief Complaint: The patient is seen in follow up today s/p diverticular abscess s/p drain placement 01/26/17  History of present illness: Austin [name] is a 61 y.o. 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    CT Abd/Pelvis: 1. Pericolonic left lower quadrant 3.9 x 2.7 cm gas containing abscess adjacent to the sigmoid colon, increased in size since 10/08/2016 CT study. 2. Progression of pulmonary metastases at the lung bases . 3. Progression of scattered peritoneal metastases. No evidence of bowel obstruction. 4. Stable splenic and bilateral adrenal metastases.  Patient underwent drain placement 01/26/17 with Dr. Vernard Gambles.  He was discharged home with outpatient follow-up.  Patient presented to IR drain clinic 02/05/17 and was found to have a fistulous connection between tube and bowel. He and his wife were instructed to stop flushes and return in 1 week.   Patient presents to clinic today with no new complaints.  His wife reports she has not been flushing tube.  She has not emptied tube or bag in the past week as there has been minimal, yellow/Austin drainage daily. Denies fever, chills, abdominal pain, difficulty eating or drinking.   Past Medical History:  Diagnosis Date  . Cerebrovascular accident ([place]) 1994   hx of brainstem stroke, residual diminished lung capacity  . Chronic kidney disease    Left Renal Mass  . Diabetes mellitus without complication ([place])   . Hx of colonic polyps   . Hypertension   . Hypogonadism   . 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 Neuromuscular disorder ([place])    quadraplegic  . Pituitary adenoma ([place])   . 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 Sleep  apnea    stopbang=4  . Urinary incontinence   . Venous stasis    edema    Past Surgical History:  Procedure Laterality Date  . CHOLECYSTECTOMY    . COLONOSCOPY  02/27/2012   Procedure: COLONOSCOPY;  Surgeon: Jerene Bears, MD;  Location: [facility] ENDOSCOPY;  Service: Gastroenterology;  Laterality: N/A;  . gamma knife radiation surgery  05/2010   for pituitary  . PITUITARY SURGERY  2007, 2011  . ROBOT ASSISTED LAPAROSCOPIC NEPHRECTOMY  04/23/2012   Procedure: ROBOTIC ASSISTED LAPAROSCOPIC NEPHRECTOMY;  Surgeon: Alexis Frock, MD;  Location: [facility] ORS;  Service: Urology;  Laterality: Left;  radical  . Robotic Partial Nephrectomy  04-23-12   left   Left Renal Mass  . stomach peg  02/1993   removed 5-6 years later  . TRACHEOSTOMY TUBE PLACEMENT  02/1993   removed 04/1993  . UMBILICAL HERNIA REPAIR  04/23/2012   Procedure: HERNIA REPAIR UMBILICAL ADULT;  Surgeon: Alexis Frock, MD;  Location: [facility] ORS;  Service: Urology;;  . [name]  . vocal cord surgery     injected with collagen, then fat from stomach to improve speech s/p stroke    Allergies: Penicillins  Medications: Prior to Admission medications   Medication Sig Start Date End Date Taking? Authorizing Provider  albuterol (PROVENTIL HFA;VENTOLIN HFA) 108 (90 BASE) MCG/ACT inhaler Inhale 2 puffs into the lungs every 6 (six) hours as needed for wheezing or shortness of breath. 07/07/15   Debbrah Alar, NP  atorvastatin (LIPITOR) 20 MG tablet Take 20 mg by mouth daily.  [provider]  clopidogrel (PLAVIX) 75 MG tablet Take 75 mg by mouth at bedtime.    [provider]  escitalopram (LEXAPRO) 10 MG tablet Take 10 mg by mouth daily.    [provider]  ketoconazole (NIZORAL) 2 % cream APPLY AS NEEDED TO RASH Patient taking differently: APPLY AS NEEDED TO RASH daily 10/23/16   [name], [name], MD  levothyroxine (SYNTHROID, LEVOTHROID) 25 MCG tablet Take 1 tablet (25 mcg total) by mouth daily before  breakfast. 12/23/16   [name], [name], MD  lisinopril-hydrochlorothiazide (PRINZIDE,ZESTORETIC) 20-12.5 MG tablet Take 1 tablet by mouth daily.    [provider]  Multiple Vitamin (MULTIVITAMIN WITH MINERALS) TABS tablet Take 1 tablet by mouth daily.    [provider]  rifaximin (XIFAXAN) 200 MG tablet Take 2 tablets (400 mg total) by mouth 3 (three) times daily. Patient not taking: Reported on 02/06/2017 02/09/17 02/23/17  Aline August, MD  saccharomyces boulardii (FLORASTOR) 250 MG capsule Take 1 capsule (250 mg total) by mouth 2 (two) times daily. 01/30/17   Aline August, MD  testosterone enanthate (DELATESTRYL) 200 MG/ML injection Inject 200 mg into the muscle every 14 (fourteen) days. For IM use only    [provider]  traMADol (ULTRAM) 50 MG tablet Take 50 mg by mouth every 6 (six) hours as needed for moderate pain.    [provider]  traZODone (DESYREL) 100 MG tablet Take 100 mg by mouth at bedtime as needed for sleep.    [provider]  triamcinolone cream (KENALOG) 0.1 % Apply 1 application topically 2 (two) times daily as needed (for rash).    [provider]  vancomycin (VANCOCIN) 50 mg/mL oral solution Take 2.5 mLs (125 mg total) by mouth 4 (four) times daily. 02/09/17   Pyrtle, Lajuan Lines, MD     Family History  Problem Relation Age of Onset  . Alcohol abuse Father   . Throat cancer Father   . Esophageal cancer Father 60  . Arthritis Mother   . Hyperlipidemia Mother   . Uterine cancer Mother 28  . Colon cancer Brother   . Arthritis Maternal Grandmother   . Hypertension Brother   . Stroke Paternal Grandmother     Social History   Social History  . Marital status: Married    Spouse name: N/A  . Number of children: 3  . Years of education: N/A   Occupational History  . disabled Unemployed   Social History Main Topics  . Smoking status: Former Smoker    Packs/day: 0.50    Years: 23.00    Types: Cigarettes    Quit  date: 09/24/1992  . Smokeless tobacco: Never Used  . Alcohol use No     Comment: rarley  . Drug use: No  . Sexual activity: Not on file   Other Topics Concern  . Not on file   Social History Narrative   Retired, disabled [place]     Vital Signs: BP (!) 145/76   Pulse 76   Temp 98 F (36.7 C)   SpO2 98%   Physical Exam  NAD, alert, paraplegic Abd:  LLQ drain intact, insertion site clean and dry.  Minimal Austin/yellow output daily.  Imaging: Dg Sinus/fist Tube Chk-non Gi  Result Date: 02/12/2017 INDICATION: Colonic diverticular abscess. Prior drain injection demonstrated a colonic fistula. Small amount of cloudy yellow fluid drainage since the previous examination. EXAM: DRAIN INJECTION WITH FLUOROSCOPY MEDICATIONS: None ANESTHESIA/SEDATION: None COMPLICATIONS: None immediate. FLUOROSCOPY TIME:  12 seconds, 44 mGy CONTRAST:  5 mL PROCEDURE: Patient was placed supine on the fluoroscopic table. Contrast was injected through the drainage catheter under fluoroscopic evaluation. Drain was flushed with normal saline at the end of the procedure. Catheter was attached to a gravity bag. FINDINGS: Drainage catheter in a stable position in the left lower abdomen. No significant residual abscess collection. However, there is a persistent fistula tract to the adjacent sigmoid colon. 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 Electronically Signed   By: Markus Daft M.D.   On: 02/12/2017 14:44    Labs:  CBC:  Recent Labs  01/28/17 0831 01/29/17 0726 01/30/17 0706 02/06/17 1605  WBC 10.5 9.8 10.6* 10.0  HGB 11.8* 11.7* 11.9* 11.9*  HCT 36.7* 35.6* 36.2* 36.6*  PLT 244 242 242 357    COAGS:  Recent Labs  10/02/16 2353  INR 1.32    BMP:  Recent Labs  01/27/17 0855 01/28/17 0831 01/29/17 0726 01/30/17 0706 02/06/17 1605  NA 137 137 139 139 135  K 4.3 4.4 4.1 3.9 4.7  CL 113* 113* 113* 113* 107  CO2 13* 16* 18* 19* 17*    GLUCOSE 55* 76 76 98 148*  BUN 21* '19 16 19 ' 29*  CALCIUM 8.3* 8.4* 8.4* 8.3* 8.5*  CREATININE 1.26* 1.26* 1.18 1.22 1.32*  GFRNONAA 59* 59* >60 >60  --   GFRAA >60 >60 >60 >60  --     LIVER FUNCTION TESTS:  Recent Labs  09/27/16 1823 10/08/16 0442 11/11/16 0913 01/23/17 1345  BILITOT 0.6 0.5 <0.22 0.4  AST 20 13* 14 26  ALT 27 12* 18 26  ALKPHOS 70 50 63 70  PROT 7.9 5.7* 7.7 8.0  ALBUMIN 3.5 2.3* 3.1* 3.1*    Assessment: Patient with past medical history of diverticulitis presented to [facility] ED with diverticular abscess identified by CT 01/23/17.  He underwent drain placement 01/26/17 with Dr. Vernard Gambles.   Patient presented to drain clinic 6/28 for management of his drain and at that time injection demonstrated a fistulous connection between drain and bowel.  He presents to clinic today for reassessment and injection which is dictated separately.  Injection today shows persistent fistula.  Drain to remain in place.  Repeat injection in 2 weeks.  Patient also with upcoming appointment with surgery.  Continue with routine drain/site care.  Continue with no flushes. Empty bag daily and record output.  Wife given instructions and time/date of next IR appointment prior to leaving.     Signed: Docia Barrier, PA 02/12/2017, 2:51 PM   Please refer to Dr. Anselm Pancoast attestation of this note for management and plan.

## 2017-02-17 DIAGNOSIS — C649 Malignant neoplasm of unspecified kidney, except renal pelvis: Secondary | ICD-10-CM | POA: Diagnosis not present

## 2017-02-17 DIAGNOSIS — K5720 Diverticulitis of large intestine with perforation and abscess without bleeding: Secondary | ICD-10-CM | POA: Diagnosis not present

## 2017-02-17 DIAGNOSIS — C7989 Secondary malignant neoplasm of other specified sites: Secondary | ICD-10-CM | POA: Diagnosis not present

## 2017-02-17 DIAGNOSIS — Z7901 Long term (current) use of anticoagulants: Secondary | ICD-10-CM | POA: Diagnosis not present

## 2017-02-19 ENCOUNTER — Other Ambulatory Visit: Payer: Self-pay

## 2017-02-19 NOTE — Patient Outreach (Signed)
[facility]) Care Management  02/19/2017  Austin [name] 09-26-1955 [id number]  Subjective: none  Objective: none  Assessment: 61 year old with recent admission 6/15-6/22 due to diverticulitis, Intra-abdominal abscess. 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  RNCM called for transition of care. RNCM spoke with both client and his wife. No problems or concerns identified at this time.  Plan: transition of care call next week.  Thea Silversmith, RN, MSN, [place] Coordinator Cell: [phone]

## 2017-02-20 MED FILL — VANCOMYCIN 50MG/ML-ORAL SOL: 14 days supply | Qty: 80 | Fill #0

## 2017-02-25 ENCOUNTER — Other Ambulatory Visit: Payer: Self-pay

## 2017-02-25 NOTE — Patient Outreach (Signed)
Austin [name]) Care Management  Austin [name]  02/25/2017   Austin [name] 11-Jun-1956 [id number]  Subjective: per Austin [name], "he is a little stronger, day by day".  Objective: none   Encounter Medications:  Outpatient Encounter Prescriptions as of 02/25/2017  Medication Sig Note  . albuterol (PROVENTIL HFA;VENTOLIN HFA) 108 (90 BASE) MCG/ACT inhaler Inhale 2 puffs into the lungs every 6 (six) hours as needed for wheezing or shortness of breath.   Marland Kitchen atorvastatin (LIPITOR) 20 MG tablet Take 20 mg by mouth daily.   . clopidogrel (PLAVIX) 75 MG tablet Take 75 mg by mouth at bedtime.   Marland Kitchen escitalopram (LEXAPRO) 10 MG tablet Take 10 mg by mouth daily.   Marland Kitchen ketoconazole (NIZORAL) 2 % cream APPLY AS NEEDED TO RASH (Patient taking differently: APPLY AS NEEDED TO RASH daily)   . levothyroxine (SYNTHROID, LEVOTHROID) 25 MCG tablet Take 1 tablet (25 mcg total) by mouth daily before breakfast. 02/04/2017: On hold  . lisinopril-hydrochlorothiazide (PRINZIDE,ZESTORETIC) 20-12.5 MG tablet Take 1 tablet by mouth daily.   . Multiple Vitamin (MULTIVITAMIN WITH MINERALS) TABS tablet Take 1 tablet by mouth daily.   Marland Kitchen saccharomyces boulardii (FLORASTOR) 250 MG capsule Take 1 capsule (250 mg total) by mouth 2 (two) times daily.   Marland Kitchen testosterone enanthate (DELATESTRYL) 200 MG/ML injection Inject 200 mg into the muscle every 14 (fourteen) days. For IM use only   . traMADol (ULTRAM) 50 MG tablet Take 50 mg by mouth every 6 (six) hours as needed for moderate pain.   . traZODone (DESYREL) 100 MG tablet Take 100 mg by mouth at bedtime as needed for sleep.   Marland Kitchen triamcinolone cream (KENALOG) 0.1 % Apply 1 application topically 2 (two) times daily as needed (for rash).   . vancomycin (VANCOCIN) 50 mg/mL oral solution Take 2.5 mLs (125 mg total) by mouth 4 (four) times daily.   . vancomycin (VANCOCIN) 50 mg/mL oral solution Take 112m BID for 7 days Take 1225mDaily for 7 days Take 12535mvery 2  days for 8 weeks    No facility-administered encounter medications on file as of 02/25/2017.     Functional Status:  In your present state of health, do you have any difficulty performing the following activities: 02/12/2017 01/23/2017  Hearing? N N  Vision? N N  Difficulty concentrating or making decisions? N N  Walking or climbing stairs? Y Y  Dressing or bathing? Y Y  Doing errands, shopping? Y YTempie Donningreparing Food and eating ? N -  Using the Toilet? Y -  In the past six months, have you accidently leaked urine? N -  Do you have problems with loss of bowel control? Y -  Managing your Medications? Y -  Managing your Finances? Y -  Housekeeping or managing your Housekeeping? Y -  Some recent data might be hidden    Fall/Depression Screening: Fall Risk  02/19/2017 05/25/2014  Falls in the past year? No No  Risk for fall due to : - Other (Comment)  Risk for fall due to (comments): - patient not ambulatory   PHQ 2/9 Scores 02/19/2017 05/25/2014  PHQ - 2 Score 0 0    Assessment: 61 70ar old with recent admission 6/15-6/22 due to diverticulitis, Intra-abdominal abscess. 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  RNCM called for transition of care. RNCM spoke with Austin [name]. She reports Austin [name] is a little stronger, no changes in medications except the tapering dose of Vancomycin. She  reports the drain is not clear, but states this is not new and the surgeon is aware. Austin [name] has a follow up on August 10 with his surgeon.  Austin [name] denies any questions or concerns at this time.   Austin [name] continues to prefer telephonic follow up.  Plan: telephonic follow up next week.  [facility] CM Care Plan Problem One     Most Recent Value  Care Plan Problem One  at risk for readmission  Role Documenting the Problem One  Care Management [place] for Problem One  Active  [facility] Long Term Goal   Austin [name] will not be readmitted within the next 31  days.  [facility] Long Term Goal Start Date  02/02/17  Interventions for Problem One Long Term Goal  discussed drain-no changes, encouraged continued follow up with providers as scheduled, encouraged to call provider/RNCM as needed.  [facility] CM Short Term Goal #1   Austin [name]/caregiver will schedule follow up appointmet within the next 1-2 weeks.  [facility] CM Short Term Goal #1 Start Date  02/02/17  [facility] CM Short Term Goal #1 Met Date  02/19/17  [facility] CM Short Term Goal #2   Austin [name] caregiver will verbalize contact with refered [facility] pharmacist.  [facility] CM Short Term Goal #2 Start Date  02/02/17  [facility] CM Short Term Goal #2 Met Date  02/12/17      Thea Silversmith, RN, MSN, [place] Coordinator Cell: [phone]

## 2017-02-26 ENCOUNTER — Other Ambulatory Visit: Payer: PPO

## 2017-03-05 ENCOUNTER — Other Ambulatory Visit: Payer: Self-pay

## 2017-03-05 NOTE — Patient Outreach (Signed)
[facility]) Care Management  03/05/2017  JEANCARLO LEFFLER 01/28/1956 [id number]   Subjective: none  Objective: none  Assessment: 61 year old with recent admission 6/15-6/22 due to diverticulitis, Intra-abdominal abscess. 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  RNCM has been following for transition of care. No answer HIPPA compliant message left.   Plan: follow up next week, if no return call.  Thea Silversmith, RN, MSN, [place] Coordinator Cell: [phone]

## 2017-03-06 MED FILL — VANCOMYCIN 50MG/ML-ORAL SOL: 14 days supply | Qty: 40 | Fill #0

## 2017-03-09 ENCOUNTER — Other Ambulatory Visit: Payer: Self-pay

## 2017-03-09 NOTE — Patient Outreach (Addendum)
[facility] [facility]) Care Management  03/09/2017  Austin [name] [birth year] [id number]   Subjective: none  Objective: none  Assessment: 61 year old with recent admission 6/15-6/22 due to diverticulitis, Intra-abdominal abscess. 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  RNCM has been following for transition of care.Transition of care period completed.   RNCM called to follow up. No answer. HIPPA compliant message left. 2nd outreach attempt.  Plan: RNCM will follow up within 1-2 weeks if no return response.  Thea Silversmith, RN, MSN, [place] Coordinator Cell: [phone]

## 2017-03-13 ENCOUNTER — Telehealth: Payer: Self-pay | Admitting: Oncology

## 2017-03-13 ENCOUNTER — Other Ambulatory Visit (HOSPITAL_BASED_OUTPATIENT_CLINIC_OR_DEPARTMENT_OTHER): Payer: PPO

## 2017-03-13 ENCOUNTER — Ambulatory Visit (HOSPITAL_BASED_OUTPATIENT_CLINIC_OR_DEPARTMENT_OTHER): Payer: PPO | Admitting: Oncology

## 2017-03-13 VITALS — BP 116/86 | HR 80 | Temp 98.7°F | Resp 18 | Ht 73.0 in

## 2017-03-13 DIAGNOSIS — A0472 Enterocolitis due to Clostridium difficile, not specified as recurrent: Secondary | ICD-10-CM | POA: Diagnosis not present

## 2017-03-13 DIAGNOSIS — C649 Malignant neoplasm of unspecified kidney, except renal pelvis: Secondary | ICD-10-CM

## 2017-03-13 DIAGNOSIS — Z85528 Personal history of other malignant neoplasm of kidney: Secondary | ICD-10-CM

## 2017-03-13 LAB — COMPREHENSIVE METABOLIC PANEL
ALBUMIN: 3 g/dL — AB (ref 3.5–5.0)
ALK PHOS: 91 U/L (ref 40–150)
ALT: 21 U/L (ref 0–55)
ANION GAP: 7 meq/L (ref 3–11)
AST: 17 U/L (ref 5–34)
BILIRUBIN TOTAL: 0.27 mg/dL (ref 0.20–1.20)
BUN: 27.4 mg/dL — ABNORMAL HIGH (ref 7.0–26.0)
CALCIUM: 9.3 mg/dL (ref 8.4–10.4)
CO2: 24 mEq/L (ref 22–29)
CREATININE: 1.3 mg/dL (ref 0.7–1.3)
Chloride: 106 mEq/L (ref 98–109)
EGFR: 56 mL/min/{1.73_m2} — AB (ref >90)
Glucose: 91 mg/dl (ref 70–140)
Potassium: 4.6 mEq/L (ref 3.5–5.1)
Sodium: 138 mEq/L (ref 136–145)
TOTAL PROTEIN: 7.9 g/dL (ref 6.4–8.3)

## 2017-03-13 LAB — CBC WITH DIFFERENTIAL/PLATELET
BASO%: 1.1 % (ref 0.0–2.0)
Basophils Absolute: 0.1 10*3/uL (ref 0.0–0.1)
EOS ABS: 0.5 10*3/uL (ref 0.0–0.5)
EOS%: 4.1 % (ref 0.0–7.0)
HEMATOCRIT: 41.1 % (ref 38.4–49.9)
HGB: 13.4 g/dL (ref 13.0–17.1)
LYMPH#: 1.2 10*3/uL (ref 0.9–3.3)
LYMPH%: 11.1 % — AB (ref 14.0–49.0)
MCH: 27.9 pg (ref 27.2–33.4)
MCHC: 32.6 g/dL (ref 32.0–36.0)
MCV: 85.6 fL (ref 79.3–98.0)
MONO#: 1.6 10*3/uL — AB (ref 0.1–0.9)
MONO%: 14.2 % — ABNORMAL HIGH (ref 0.0–14.0)
NEUT%: 69.5 % (ref 39.0–75.0)
NEUTROS ABS: 7.7 10*3/uL — AB (ref 1.5–6.5)
PLATELETS: 238 10*3/uL (ref 140–400)
RBC: 4.8 10*6/uL (ref 4.20–5.82)
RDW: 18.1 % — ABNORMAL HIGH (ref 11.0–14.6)
WBC: 11.1 10*3/uL — AB (ref 4.0–10.3)

## 2017-03-13 NOTE — Telephone Encounter (Signed)
Gave patient an avs report and calendar for December.

## 2017-03-13 NOTE — Progress Notes (Signed)
Hematology and Oncology Follow Up Visit  Austin [name] [id number] November 16, 1955 61 y.o. 03/13/2017 9:54 AM [name], [name], MDBurchette, [name], MD   Principle Diagnosis: 61 year old man with a renal cell carcinoma diagnosed in 2013 he presented with a 4.2 cm mass in the lower pole of the left kidney. Now he has stage IV disease. He has peritoneal involvement as well as splenic metastasis. 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 He is status post robotic-assisted laparoscopic nephrectomy on the left done on 04/23/2012. The pathology revealed renal cell carcinoma with a grade 3/4 with the pathological staging of T3a.   2. Votrient 800 mg daily started around 12/27/2013. This was reduced to 400 mg subsequently and have been discontinued since 02/03/2014 due to poor tolerance.  Current therapy: Observation and surveillance.  Interim History: Austin [name] presents today for a followup visit with his wife. Since his last visit, he reports feeling reasonably fair. He continues to have issues with recurrent C. difficile colitis and currently on tapering doses of oral vancomycin. He reports that his abscess appears to be improving although possible that he developed a fistula. He is under consideration for possible dilating colostomy. Clinically he reports he is doing reasonably well with decrease in his abdominal pain. He denied any fevers or diarrhea. His quality of life has not changed continues to rely on his wife for most activities of daily living.   He had any headaches, blurry vision, or seizures. He does not report any fevers or chills or sweats. Has not reported chest pain or difficulty breathing. Has not reported any nausea, vomiting or early satiety. Has not reported any hematochezia or melena. He has not reported any arthralgias or myalgias. As that report any lymphadenopathy or petechiae. Is not reporting any bleeding complications. Rest  of review of systems unremarkable.  Medications: I have reviewed the patient's current medications.  Current Outpatient Prescriptions  Medication Sig Dispense Refill  . albuterol (PROVENTIL HFA;VENTOLIN HFA) 108 (90 BASE) MCG/ACT inhaler Inhale 2 puffs into the lungs every 6 (six) hours as needed for wheezing or shortness of breath. 1 Inhaler 0  . atorvastatin (LIPITOR) 20 MG tablet Take 20 mg by mouth daily.    . clopidogrel (PLAVIX) 75 MG tablet Take 75 mg by mouth at bedtime.    Marland Kitchen escitalopram (LEXAPRO) 10 MG tablet Take 10 mg by mouth daily.    Marland Kitchen ketoconazole (NIZORAL) 2 % cream APPLY AS NEEDED TO RASH (Patient taking differently: APPLY AS NEEDED TO RASH daily) 60 g 1  . levothyroxine (SYNTHROID, LEVOTHROID) 25 MCG tablet Take 1 tablet (25 mcg total) by mouth daily before breakfast. 90 tablet 0  . lisinopril-hydrochlorothiazide (PRINZIDE,ZESTORETIC) 20-12.5 MG tablet Take 1 tablet by mouth daily.    . Multiple Vitamin (MULTIVITAMIN WITH MINERALS) TABS tablet Take 1 tablet by mouth daily.    Marland Kitchen saccharomyces boulardii (FLORASTOR) 250 MG capsule Take 1 capsule (250 mg total) by mouth 2 (two) times daily. 20 capsule 0  . testosterone enanthate (DELATESTRYL) 200 MG/ML injection Inject 200 mg into the muscle every 14 (fourteen) days. For IM use only    . traMADol (ULTRAM) 50 MG tablet Take 50 mg by mouth every 6 (six) hours as needed for moderate pain.    . traZODone (DESYREL) 100 MG tablet Take 100 mg by mouth at bedtime as needed for sleep.    Marland Kitchen triamcinolone cream (KENALOG) 0.1 % Apply 1 application  topically 2 (two) times daily as needed (for rash).    . vancomycin (VANCOCIN) 50 mg/mL oral solution Take 2.5 mLs (125 mg total) by mouth 4 (four) times daily. 140 mL 0  . vancomycin (VANCOCIN) 50 mg/mL oral solution Take 125mg  BID for 7 days Take 125mg  Daily for 7 days Take 125mg  every 2 days for 8 weeks 115 mL 0   No current facility-administered medications for this visit.      Allergies:   Allergies  Allergen Reactions  . Penicillins Rash and Other (See Comments)    Has patient had a PCN reaction causing immediate rash, facial/tongue/throat swelling, SOB or lightheadedness with hypotension: No Has patient had a PCN reaction causing severe rash involving mucus membranes or skin necrosis: No Has patient had a PCN reaction that required hospitalization No Has patient had a PCN reaction occurring within the last 10 years: No If all of the above answers are "NO", then may proceed with Cephalosporin use.    Past Medical History, Surgical history, Social history, and Family History were reviewed and updated.    Physical Exam: Blood pressure 116/86, pulse 80, temperature 98.7 F (37.1 C), temperature source Oral, resp. rate 18, height 6\' 1"  (1.854 m), SpO2 100 %. ECOG: 2 General appearance: Alert, awake gentleman without distress. Head: Normocephalic, without obvious abnormality. No oral ulcers or lesions. Neck: no adenopathy or thyroid masses. Lymph nodes: Cervical, supraclavicular, and axillary nodes normal. Heart:regular rate and rhythm, S1, S2 normal, no murmur, click, rub or gallop Lung:chest clear, no wheezing, rales, no dullness to percussion.  Abdomin: soft, non-tender, without masses or organomegaly no shifting dullness or ascites. EXT: No edema noted.   Lab Results: Lab Results  Component Value Date   WBC 11.1 (H) 03/13/2017   HGB 13.4 03/13/2017   HCT 41.1 03/13/2017   MCV 85.6 03/13/2017   PLT 238 03/13/2017     Chemistry      Component Value Date/Time   NA 135 02/06/2017 1605   NA 135 (L) 11/11/2016 0913   K 4.7 02/06/2017 1605   K 4.6 11/11/2016 0913   CL 107 02/06/2017 1605   CO2 17 (L) 02/06/2017 1605   CO2 17 (L) 11/11/2016 0913   BUN 29 (H) 02/06/2017 1605   BUN 42.8 (H) 11/11/2016 0913   CREATININE 1.32 (H) 02/06/2017 1605   CREATININE 1.5 (H) 11/11/2016 0913      Component Value Date/Time   CALCIUM 8.5 (L) 02/06/2017 1605   CALCIUM  9.4 11/11/2016 0913   ALKPHOS 70 01/23/2017 1345   ALKPHOS 63 11/11/2016 0913   AST 26 01/23/2017 1345   AST 14 11/11/2016 0913   ALT 26 01/23/2017 1345   ALT 18 11/11/2016 0913   BILITOT 0.4 01/23/2017 1345   BILITOT <0.22 11/11/2016 0913     EXAM: CT ABDOMEN AND PELVIS WITH CONTRAST  TECHNIQUE: Multidetector CT imaging of the abdomen and pelvis was performed using the standard protocol following bolus administration of intravenous contrast.  CONTRAST:  114mL ISOVUE-300 IOPAMIDOL (ISOVUE-300) INJECTION 61%  COMPARISON:  Preoperative CT scan of the abdomen and pelvis 01/23/2017  FINDINGS: Lower chest: 9 mm left lower lobe pulmonary nodule is insignificantly changed compared to recent prior imaging. Otherwise, the visualized lower lungs are clear. Visualized cardiac structures are normal in size. Small pericardial effusion. Unremarkable distal thoracic esophagus.  Hepatobiliary: Surgical changes of prior cholecystectomy. No intra or extrahepatic biliary ductal dilatation. No discrete hepatic lesion.  Pancreas: Unremarkable. No pancreatic ductal dilatation or surrounding inflammatory changes.  Spleen: 4.1 low-attenuation lesion in spleen without significant interval change compared to recent prior imaging. However, this lesion has slowly enlarged compared to prior imaging from January of 2016.  Adrenals/Urinary Tract: The right adrenal gland is normal. The right kidney is also normal without evidence of hydronephrosis, nephrolithiasis or enhancing renal mass. Nodular thickening of the left adrenal gland likely reflects metastasis. Surgical changes of prior left nephrectomy. A region of fat necrosis in the nephrectomy bed appears similar compared to prior imaging. The left ureter and bladder are unremarkable. The right ureter is unremarkable.  Stomach/Bowel: Mild sigmoid colonic diverticulosis. Interval resolution of changes of active diverticulitis.  Furthermore, the left lower quadrant percutaneous drainage catheter is in good position with complete resolution of the previously noted abscess collection. No undrained abscess collection is evident. No focal bowel wall thickening or evidence of obstruction. The stomach and duodenum are unremarkable save for a small periampullary duodenum diverticulum.  Vascular/Lymphatic: Atherosclerotic calcifications throughout the abdominal aorta. No aneurysm or dissection. No suspicious lymphadenopathy.  Reproductive: Prostate is unremarkable.  Other: Multifocal avidly enhancing peritoneal metastases consistent with known metastatic renal cell carcinoma. The largest lesion is again measured at 8.4 cm, unchanged compared to the recent prior imaging. Central low-attenuation within the larger lesions is consistent with central necrosis.  Musculoskeletal: No acute fracture or aggressive appearing lytic or blastic osseous lesion.  IMPRESSION: 1. Complete interval resolution of the diverticular abscess. 2. The percutaneous drainage catheter remains well positioned. 3. No CT evidence of colonic fistula. 4. Metastatic renal cell carcinoma without significant interval change compared to recent prior imaging.    Impression and Plan:  61 year old gentleman with the following issues:  1. Renal cell carcinoma diagnosed with a left renal mass in July of 2013 and after a nephrectomy developed peritoneal and retroperitoneal metastasis. He was started on Votrient in May of 2015 but was discontinued due to poor tolerance.   He has been on observation and surveillance since that time.   CT scan on 02/05/2017 showed no major changes in his disease. Although he has an incurable malignancy His disease has been rather slow and progression. I see his life expectancy probably in the uterus rather than months.  2. Abdominal abscess and possible fistula: He is under consideration for possible diverging  colostomy. I have encouraged him to undergo this procedure from a quality of life standpoint. Living with fistula and recurrent abscess and infection can be rather devastating. Despite his renal cell carcinoma, I think his life expectancy is reasonable to consider a palliative colostomy.  3. Abdominal pain: Appears to be manageable at this time. He has not taken any pain medication.  4. C. difficile colitis: He is currently on vancomycin orally.  5. Prognosis: He has an incurable malignancy that is slowly progressing. His clinical status remain stable from that standpoint.  6. Followup: In 4 months.  [facility], MD 8/3/20189:54 AM

## 2017-03-13 NOTE — Patient Outreach (Signed)
[place] [facility]) Care Management  03/13/2017  Austin [name] September 17, 1955 [id number]   Late Entry for 03/09/17 3:46pm:  RNCM returned call. RNCM spoke with both client and [name], who reports client had a busy week with family who came to visit from out of town.  Upcoming appointments were discussed- upcoming appointments with radiology and surgeon regarding drain and oncologist. She denies any questions or concerns. Client denies any questions or issues or concerns.  RNCM reinforced to call RNCM as needed, also encouraged 24 hour nurse advice line use as needed.  Plan: RNCM will follow up within the next 2-3 week to assess if any additional Care Management needs.  Thea Silversmith, RN, MSN, [place] Coordinator Cell: [phone]

## 2017-03-14 ENCOUNTER — Other Ambulatory Visit: Payer: Self-pay | Admitting: Family Medicine

## 2017-03-16 ENCOUNTER — Ambulatory Visit (INDEPENDENT_AMBULATORY_CARE_PROVIDER_SITE_OTHER): Payer: PPO | Admitting: Family Medicine

## 2017-03-16 ENCOUNTER — Encounter: Payer: Self-pay | Admitting: Family Medicine

## 2017-03-16 VITALS — BP 120/80 | HR 78 | Temp 98.3°F

## 2017-03-16 DIAGNOSIS — R946 Abnormal results of thyroid function studies: Secondary | ICD-10-CM | POA: Diagnosis not present

## 2017-03-16 DIAGNOSIS — E119 Type 2 diabetes mellitus without complications: Secondary | ICD-10-CM

## 2017-03-16 DIAGNOSIS — R5383 Other fatigue: Secondary | ICD-10-CM | POA: Diagnosis not present

## 2017-03-16 DIAGNOSIS — R7989 Other specified abnormal findings of blood chemistry: Secondary | ICD-10-CM

## 2017-03-16 LAB — TSH: TSH: 2.54 u[IU]/mL (ref 0.35–4.50)

## 2017-03-16 LAB — HEMOGLOBIN A1C: Hgb A1c MFr Bld: 6.4 % (ref 4.6–6.5)

## 2017-03-16 NOTE — Progress Notes (Signed)
Subjective:     Patient ID: Austin [name], male   DOB: 1956/04/19, 61 y.o.   MRN: [id number]  HPI Patient here to discuss several items as follows  Recent complications with abdominal abscess with fistula. Also had C. difficile colitis. Is finishing up slow taper of vancomycin. No diarrhea this time. Appetite slowly improving. He's been referred to general surgeon to look at possible fistula repair.  Metastatic renal cell carcinoma and patient has decided against any further chemotherapy. His disease has been very slow to progress. Recent follow-up with oncology.  Type 2 diabetes. Most recent A1c 6.6%. Blood sugars been stable.  Elevated TSH. Patient's had prior history of pituitary tumor. He has history of low testosterone but no evidence for panhypopituitarism. Did have TSH back in May over 5. We elected to start low-dose replacement since he was having excessive fatigue issues. He started having some muscle cramps which his wife attributed to the Synthroid and discontinued the Synthroid after just couple weeks.  Past Medical History:  Diagnosis Date  . Cerebrovascular accident ([place]) 1994   hx of brainstem stroke, residual diminished lung capacity  . Chronic kidney disease    Left Renal Mass  . Diabetes mellitus without complication ([place])   . Hx of colonic polyps   . Hypertension   . Hypogonadism   . 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 Neuromuscular disorder ([place])    quadraplegic  . Pituitary adenoma ([place])   . 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 Sleep apnea    stopbang=4  . Urinary incontinence   . Venous stasis    edema   Past Surgical History:  Procedure Laterality Date  . CHOLECYSTECTOMY    . COLONOSCOPY  02/27/2012   Procedure: COLONOSCOPY;  Surgeon: Jerene Bears, MD;  Location: [facility];  Service: Gastroenterology;  Laterality: N/A;  . gamma knife radiation surgery  05/2010   for  pituitary  . PITUITARY SURGERY  2007, 2011  . ROBOT ASSISTED LAPAROSCOPIC NEPHRECTOMY  04/23/2012   Procedure: ROBOTIC ASSISTED LAPAROSCOPIC NEPHRECTOMY;  Surgeon: Alexis Frock, MD;  Location: [facility];  Service: Urology;  Laterality: Left;  radical  . Robotic Partial Nephrectomy  04-23-12   left   Left Renal Mass  . stomach peg  02/1993   removed 5-6 years later  . TRACHEOSTOMY TUBE PLACEMENT  02/1993   removed 04/1993  . UMBILICAL HERNIA REPAIR  04/23/2012   Procedure: HERNIA REPAIR UMBILICAL ADULT;  Surgeon: Alexis Frock, MD;  Location: [facility];  Service: Urology;;  . [name]  . vocal cord surgery     injected with collagen, then fat from stomach to improve speech s/p stroke    reports that he quit smoking about 24 years ago. His smoking use included Cigarettes. He has a 11.50 pack-year smoking history. He has never used smokeless tobacco. He reports that he does not drink alcohol or use drugs. family history includes Alcohol abuse in his father; Arthritis in his maternal grandmother and mother; Colon cancer in his brother; Esophageal cancer (age of onset: 20) in his father; Hyperlipidemia in his mother; Hypertension in his brother; Stroke in his paternal grandmother; Throat cancer in his father; Uterine cancer (age of onset: 68) in his mother. Allergies  Allergen Reactions  . Penicillins Rash and Other (See Comments)    Has patient had a PCN reaction causing immediate rash, facial/tongue/throat swelling, SOB or lightheadedness with hypotension:  No Has patient had a PCN reaction causing severe rash involving mucus membranes or skin necrosis: No Has patient had a PCN reaction that required hospitalization No Has patient had a PCN reaction occurring within the last 10 years: No If all of the above answers are "NO", then may proceed with Cephalosporin use.     Review of Systems  Constitutional: Positive for fatigue. Negative for chills and fever.  Respiratory: Negative for cough  and shortness of breath.   Cardiovascular: Negative for chest pain and palpitations.       He has some chronic mild lower extremity edema which is stable.  Gastrointestinal: Negative for abdominal pain, diarrhea, nausea and vomiting.  Endocrine: Negative for polydipsia and polyuria.       Objective:   Physical Exam  Constitutional: He appears well-developed and well-nourished.  Cardiovascular: Normal rate and regular rhythm.   Pulmonary/Chest: Effort normal and breath sounds normal. No respiratory distress. He has no wheezes. He has no rales.  Abdominal: Soft. There is no tenderness.  Musculoskeletal: He exhibits no edema.  Neurological: He is alert.       Assessment:     #1 type 2 diabetes. History of good control  #2 elevated TSH by recent lab work with possible intolerance to Synthroid  #3 recent C. difficile colitis improved with vancomycin  #4 abdominal abscess with fistula    Plan:     -Consult with general surgeon pending -Recheck labs today with TSH and A1c. We'll not plan to replace his thyroid unless TSH is climbing -if this remains in the range between 5 and 8. -No change in medications at this time -Routine follow-up in 3 months to reassess  [name] MD [facility]

## 2017-03-17 ENCOUNTER — Ambulatory Visit
Admission: RE | Admit: 2017-03-17 | Discharge: 2017-03-17 | Disposition: A | Discharge status: Home / Self Care | Payer: PPO | Source: Ambulatory Visit | Attending: Surgery | Admitting: Surgery

## 2017-03-17 DIAGNOSIS — K5720 Diverticulitis of large intestine with perforation and abscess without bleeding: Secondary | ICD-10-CM | POA: Diagnosis not present

## 2017-03-17 DIAGNOSIS — Z438 Encounter for attention to other artificial openings: Secondary | ICD-10-CM | POA: Diagnosis not present

## 2017-03-17 DIAGNOSIS — K5730 Diverticulosis of large intestine without perforation or abscess without bleeding: Secondary | ICD-10-CM | POA: Diagnosis not present

## 2017-03-17 HISTORY — PX: IR RADIOLOGIST EVAL & MGMT: IMG5224

## 2017-03-17 NOTE — Progress Notes (Signed)
Chief Complaint: Patient was seen in consultation today for follow up diverticular abscess drain at the request of Newman,David  Referring Physician(s): Newman,David  History of Present Illness: Austin [name] is a 61 y.o. male with past medical history of quadriplegia due to brain stem stroke several years prior, recurrent metastatic renal cell carcinoma, and previous diverticulitis with abscess who presented to [facility] ED with an abscess on 6/15.  He underwent percutaneous drainage and we have been following him since. He has image proven fistula, the last was done 7/5. He continues to gravity bag and no flushes. Reports still beige/feculent output, though minimal. He is here again today for drain injection.   Past Medical History:  Diagnosis Date  . Cerebrovascular accident ([place]) 1994   hx of brainstem stroke, residual diminished lung capacity  . Chronic kidney disease    Left Renal Mass  . Diabetes mellitus without complication ([place])   . Hx of colonic polyps   . Hypertension   . Hypogonadism   . 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 Pituitary adenoma ([place])   . 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 Sleep apnea    stopbang=4  . Urinary incontinence   . Venous stasis    edema    Past Surgical History:  Procedure Laterality Date  . CHOLECYSTECTOMY    . COLONOSCOPY  02/27/2012   Procedure: COLONOSCOPY;  Surgeon: Jerene Bears, MD;  [facility];  Service: Gastroenterology;  Laterality: N/A;  . gamma knife radiation surgery  05/2010   for pituitary  . PITUITARY SURGERY  2007, 2011  . ROBOT ASSISTED LAPAROSCOPIC NEPHRECTOMY  04/23/2012   Procedure: ROBOTIC ASSISTED LAPAROSCOPIC NEPHRECTOMY;  Surgeon: Alexis Frock, MD;  Location: [facility];  Service: Urology;  Laterality: Left;  radical  . Robotic Partial Nephrectomy  04-23-12   left   Left  Renal Mass  . stomach peg  02/1993   removed 5-6 years later  . TRACHEOSTOMY TUBE PLACEMENT  02/1993   removed 04/1993  . UMBILICAL HERNIA REPAIR  04/23/2012   Procedure: HERNIA REPAIR UMBILICAL ADULT;  Surgeon: Alexis Frock, MD;  Location: [facility];  Service: Urology;;  . [name]  . vocal cord surgery     injected with collagen, then fat from stomach to improve speech s/p stroke    Allergies: Penicillins  Medications: Prior to Admission medications   Medication Sig Start Date End Date Taking? Authorizing Provider  albuterol (PROVENTIL HFA;VENTOLIN HFA) 108 (90 BASE) MCG/ACT inhaler Inhale 2 puffs into the lungs every 6 (six) hours as needed for wheezing or shortness of breath. 07/07/15   Debbrah Alar, NP  atorvastatin (LIPITOR) 20 MG tablet Take 20 mg by mouth daily.    [provider]  atorvastatin (LIPITOR) 20 MG tablet TAKE ONE TABLET BY MOUTH ONCE DAILY IN THE MORNING 03/16/17   [name], [name], MD  clopidogrel (PLAVIX) 75 MG tablet Take 75 mg by mouth at bedtime.    [provider]  escitalopram (LEXAPRO) 10 MG tablet Take 10 mg by mouth daily.    [provider]  escitalopram (LEXAPRO) 10 MG tablet TAKE ONE TABLET BY MOUTH ONCE DAILY IN THE MORNING 03/16/17   [name], [name], MD  ketoconazole (NIZORAL) 2 % cream APPLY AS NEEDED TO RASH Patient taking differently: APPLY AS NEEDED TO RASH daily 10/23/16  [name], [name], MD  lisinopril-hydrochlorothiazide (PRINZIDE,ZESTORETIC) 20-12.5 MG tablet Take 1 tablet by mouth daily.    [provider]  Multiple Vitamin (MULTIVITAMIN WITH MINERALS) TABS tablet Take 1 tablet by mouth daily.    [provider]  saccharomyces boulardii (FLORASTOR) 250 MG capsule Take 1 capsule (250 mg total) by mouth 2 (two) times daily. 01/30/17   Aline August, MD  testosterone enanthate (DELATESTRYL) 200 MG/ML injection Inject 200 mg into the muscle every 14 (fourteen) days. For IM use only     [provider]  traMADol (ULTRAM) 50 MG tablet Take 50 mg by mouth every 6 (six) hours as needed for moderate pain.    [provider]  traZODone (DESYREL) 100 MG tablet Take 100 mg by mouth at bedtime as needed for sleep.    [provider]  traZODone (DESYREL) 100 MG tablet TAKE ONE TABLET BY MOUTH AT BEDTIME 03/16/17   [name], [name], MD  triamcinolone cream (KENALOG) 0.1 % Apply 1 application topically 2 (two) times daily as needed (for rash).    [provider]  vancomycin (VANCOCIN) 50 mg/mL oral solution Take 151m BID for 7 days Take 1233mDaily for 7 days Take 12556mvery 2 days for 8 weeks 02/12/17   Pyrtle, JayLajuan LinesD     Family History  Problem Relation Age of Onset  . Alcohol abuse Father   . Throat cancer Father   . Esophageal cancer Father 60 77 Arthritis Mother   . Hyperlipidemia Mother   . Uterine cancer Mother 58 22 Colon cancer Brother   . Arthritis Maternal Grandmother   . Hypertension Brother   . Stroke Paternal Grandmother     Social History   Social History  . Marital status: Married    Spouse name: N/A  . Number of children: 3  . Years of education: N/A   Occupational History  . disabled Unemployed   Social History Main Topics  . Smoking status: Former Smoker    Packs/day: 0.50    Years: 23.00    Types: Cigarettes    Quit date: 09/24/1992  . Smokeless tobacco: Never Used  . Alcohol use No     Comment: rarley  . Drug use: No  . Sexual activity: Not on file   Other Topics Concern  . Not on file   Social History Narrative   Retired, disabled [address] 12 point ROS discussed and pertinent positives are indicated in the HPI above.  All other systems are negative.  Review of Systems  Vital Signs: BP 133/75 (BP Location: Right Arm)   Pulse 66   Temp 98.2 F (36.8 C) (Oral)   SpO2 99%   Physical Exam LLQ drain intact, site clean Scant beige/feculent output in  bag Drain injection completed, again demonstrating patency of the fistula, see full report. New bag connected to drain.    Imaging: No results found.  Labs:  CBC:  Recent Labs  01/29/17 0726 01/30/17 0706 02/06/17 1605 03/13/17 0922  WBC 9.8 10.6* 10.0 11.1*  HGB 11.7* 11.9* 11.9* 13.4  HCT 35.6* 36.2* 36.6* 41.1  PLT 242 242 357 238    COAGS:  Recent Labs  10/02/16 2353  INR 1.32    BMP:  Recent Labs  01/27/17 0855 01/28/17 0831 01/29/17 0726 01/30/17 0706 02/06/17 1605 03/13/17 0922  NA 137 137 139 139 135 138  K 4.3 4.4 4.1 3.9 4.7 4.6  CL  113* 113* 113* 113* 107  --   CO2 13* 16* 18* 19* 17* 24  GLUCOSE 55* 76 76 98 148* 91  BUN 21* '19 16 19 ' 29* 27.4*  CALCIUM 8.3* 8.4* 8.4* 8.3* 8.5* 9.3  CREATININE 1.26* 1.26* 1.18 1.22 1.32* 1.3  GFRNONAA 59* 59* >60 >60  --   --   GFRAA >60 >60 >60 >60  --   --     LIVER FUNCTION TESTS:  Recent Labs  10/08/16 0442 11/11/16 0913 01/23/17 1345 03/13/17 0922  BILITOT 0.5 <0.22 0.4 0.27  AST 13* '14 26 17  ' ALT 12* '18 26 21  ' ALKPHOS 50 63 70 91  PROT 5.7* 7.7 8.0 7.9  ALBUMIN 2.3* 3.1* 3.1* 3.0*    TUMOR MARKERS: No results for input(s): AFPTM, CEA, CA199, CHROMGRNA in the last 8760 hours.  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 Pt to follow up with his surgeon later this week.  Thank you for this interesting consult.  I greatly enjoyed meeting Austin [name] and look forward to participating in their care.  A copy of this report was sent to the requesting provider on this date.  Electronically Signed: Ascencion Dike 03/17/2017, 1:43 PM   I spent a total of 15 minutes in face to face in clinical consultation, greater than 50% of which was counseling/coordinating care for abscess drain care

## 2017-03-20 DIAGNOSIS — G8250 Quadriplegia, unspecified: Secondary | ICD-10-CM | POA: Diagnosis not present

## 2017-03-20 DIAGNOSIS — Z7901 Long term (current) use of anticoagulants: Secondary | ICD-10-CM | POA: Diagnosis not present

## 2017-03-20 DIAGNOSIS — C7989 Secondary malignant neoplasm of other specified sites: Secondary | ICD-10-CM | POA: Diagnosis not present

## 2017-03-20 DIAGNOSIS — C649 Malignant neoplasm of unspecified kidney, except renal pelvis: Secondary | ICD-10-CM | POA: Diagnosis not present

## 2017-03-20 DIAGNOSIS — K5720 Diverticulitis of large intestine with perforation and abscess without bleeding: Secondary | ICD-10-CM | POA: Diagnosis not present

## 2017-03-20 MED FILL — VANCOMYCIN 50MG/ML-ORAL SOL: 14 days supply | Qty: 40 | Fill #1

## 2017-03-23 ENCOUNTER — Ambulatory Visit: Payer: PPO | Admitting: Family Medicine

## 2017-03-24 ENCOUNTER — Other Ambulatory Visit: Payer: Self-pay | Admitting: Surgery

## 2017-03-24 DIAGNOSIS — K5720 Diverticulitis of large intestine with perforation and abscess without bleeding: Secondary | ICD-10-CM

## 2017-03-30 ENCOUNTER — Other Ambulatory Visit: Payer: Self-pay

## 2017-03-30 NOTE — Patient Outreach (Signed)
[facility]) Care Management  03/30/2017  Austin [name] May 29, 1956 [id number]   Subjective: none  Objective: none  Assessment: 61 year old with recent admission 6/15-6/22 due to diverticulitis, Intra-abdominal abscess. 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  RNCM called to follow up and address any additional CM needs. No answer. HIPPA compliant message left.  Plan: await return call and follow up within 1-2 weeks.  Thea Silversmith, RN, MSN, [place] Coordinator Cell: [phone]

## 2017-03-31 ENCOUNTER — Other Ambulatory Visit: Payer: Self-pay

## 2017-03-31 NOTE — Patient Outreach (Signed)
[facility]) Care Management  03/31/2017  Austin [name] [birth year] [id number]   Subjective: client's wife/caregiver reports no changes.  Objective: none-telephonic call  Assessment: 60 year old with admission 6/15-6/22 due to diverticulitis, Intra-abdominal abscess. 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 Intra-abdominal drain continues to be in place.   RNCM spoke with client's wife (caregiver) who states client has had an x-ray and follow up with surgeon-no changes at this time. Client/wife will continue with care as previous and follow up with surgeon in a couple months. Mrs. Lierman with no concerns regarding drain care.  Mrs. Kilner denies any questions or concerns. She discussed her family continues to visit and she is looking forward to the fall. No Care Management needs identifid.  RNCM discussed case closure and Mrs. Butner is in agreement. Mrs. Gagan to call RNCM as needed. RNCM also reinforced that she can call the 24 hour nurse advice line as needed.  Plan: close case.   Thea Silversmith, RN, MSN, [place] Coordinator Cell: [phone]

## 2017-04-03 MED FILL — VANCOMYCIN 50MG/ML-ORAL SOL: 14 days supply | Qty: 40 | Fill #2

## 2017-04-07 ENCOUNTER — Encounter: Payer: Self-pay | Admitting: Family Medicine

## 2017-04-07 ENCOUNTER — Ambulatory Visit (INDEPENDENT_AMBULATORY_CARE_PROVIDER_SITE_OTHER): Payer: PPO | Admitting: Family Medicine

## 2017-04-07 VITALS — BP 102/76 | HR 78 | Temp 97.8°F

## 2017-04-07 DIAGNOSIS — Z23 Encounter for immunization: Secondary | ICD-10-CM

## 2017-04-07 DIAGNOSIS — R300 Dysuria: Secondary | ICD-10-CM | POA: Diagnosis not present

## 2017-04-07 LAB — POCT URINALYSIS DIPSTICK
BILIRUBIN UA: NEGATIVE
GLUCOSE UA: NEGATIVE
Ketones, UA: NEGATIVE
NITRITE UA: POSITIVE
PH UA: 6 (ref 5.0–8.0)
Protein, UA: POSITIVE
Spec Grav, UA: 1.025 (ref 1.010–1.025)
UROBILINOGEN UA: 0.2 U/dL

## 2017-04-07 MED ORDER — CIPROFLOXACIN HCL 500 MG PO TABS: 500.0000 mg | ORAL_TABLET | Freq: Two times a day (BID) | ORAL | 0 refills | Status: DC

## 2017-04-07 NOTE — Progress Notes (Signed)
Subjective:     Patient ID: Austin [name], male   DOB: 04-Nov-1955, 61 y.o.   MRN: [id number]  HPI Patient has history of neurogenic bladder following brainstem stroke. He does infrequent in and out caths and has had history of recurrent UTI. Wife came in for visit today and she reports that he has had some night sweats. They've not taken his temperature at home. He's been more irritable than usual past few days. She has noted his urine looks more cloudy than usual and also foul odor. Has had similar symptoms with UTI in the past.  He does have history of C. difficile in the past and is still taking low-dose vancomycin.  Past Medical History:  Diagnosis Date  . Cerebrovascular accident ([place]) 1994   hx of brainstem stroke, residual diminished lung capacity  . Chronic kidney disease    Left Renal Mass  . Diabetes mellitus without complication ([place])   . Hx of colonic polyps   . Hypertension   . Hypogonadism   . 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 Neuromuscular disorder ([place])    quadraplegic  . Pituitary adenoma ([place])   . 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 Sleep apnea    stopbang=4  . Urinary incontinence   . Venous stasis    edema   Past Surgical History:  Procedure Laterality Date  . CHOLECYSTECTOMY    . COLONOSCOPY  02/27/2012   Procedure: COLONOSCOPY;  Surgeon: Jerene Bears, MD;  Location: [facility];  Service: Gastroenterology;  Laterality: N/A;  . gamma knife radiation surgery  05/2010   for pituitary  . PITUITARY SURGERY  2007, 2011  . ROBOT ASSISTED LAPAROSCOPIC NEPHRECTOMY  04/23/2012   Procedure: ROBOTIC ASSISTED LAPAROSCOPIC NEPHRECTOMY;  Surgeon: Alexis Frock, MD;  Location: [facility];  Service: Urology;  Laterality: Left;  radical  . Robotic Partial Nephrectomy  04-23-12   left   Left Renal Mass  . stomach peg  02/1993   removed 5-6 years later  . TRACHEOSTOMY TUBE PLACEMENT  02/1993    removed 04/1993  . UMBILICAL HERNIA REPAIR  04/23/2012   Procedure: HERNIA REPAIR UMBILICAL ADULT;  Surgeon: Alexis Frock, MD;  Location: [facility];  Service: Urology;;  . [name]  . vocal cord surgery     injected with collagen, then fat from stomach to improve speech s/p stroke    reports that he quit smoking about 24 years ago. His smoking use included Cigarettes. He has a 11.50 pack-year smoking history. He has never used smokeless tobacco. He reports that he does not drink alcohol or use drugs. family history includes Alcohol abuse in his father; Arthritis in his maternal grandmother and mother; Colon cancer in his brother; Esophageal cancer (age of onset: 56) in his father; Hyperlipidemia in his mother; Hypertension in his brother; Stroke in his paternal grandmother; Throat cancer in his father; Uterine cancer (age of onset: 52) in his mother. Allergies  Allergen Reactions  . Penicillins Rash and Other (See Comments)    Has patient had a PCN reaction causing immediate rash, facial/tongue/throat swelling, SOB or lightheadedness with hypotension: No Has patient had a PCN reaction causing severe rash involving mucus membranes or skin necrosis: No Has patient had a PCN reaction that required hospitalization No Has patient had a PCN reaction occurring within the last 10 years: No If all of the above answers are "NO", then may proceed with  Cephalosporin use.     Review of Systems  Constitutional: Negative for chills and fever.  Gastrointestinal: Negative for abdominal pain, nausea and vomiting.  Genitourinary: Positive for dysuria.       Objective:   Physical Exam  Constitutional: He appears well-developed and well-nourished.       Assessment:     Probable recurrent UTI in a patient with high risk secondary to neurogenic bladder. He does have history of C. difficile colitis but also very high risk for urosepsis if not treated    Plan:     -Start Cipro 500 mg twice  daily for 5 days -Stay well-hydrated Urine culture sent -Follow-up promptly for any fever or worsening symptoms  [name] MD [facility] -

## 2017-04-10 LAB — URINE CULTURE

## 2017-04-14 ENCOUNTER — Ambulatory Visit
Admission: RE | Admit: 2017-04-14 | Discharge: 2017-04-14 | Disposition: A | Discharge status: Home / Self Care | Payer: PPO | Source: Ambulatory Visit | Attending: Surgery | Admitting: Surgery

## 2017-04-14 ENCOUNTER — Other Ambulatory Visit: Payer: PPO

## 2017-04-14 DIAGNOSIS — K5720 Diverticulitis of large intestine with perforation and abscess without bleeding: Secondary | ICD-10-CM | POA: Diagnosis not present

## 2017-04-14 DIAGNOSIS — K5780 Diverticulitis of intestine, part unspecified, with perforation and abscess without bleeding: Secondary | ICD-10-CM | POA: Diagnosis not present

## 2017-04-14 DIAGNOSIS — Z438 Encounter for attention to other artificial openings: Secondary | ICD-10-CM | POA: Diagnosis not present

## 2017-04-14 HISTORY — PX: IR RADIOLOGIST EVAL & MGMT: IMG5224

## 2017-04-14 NOTE — Progress Notes (Signed)
Interventional Radiology Progress Note    Austin [name] returns today for repeat left LQ drain injection.  He has a history of LLQ abscess, drained with pigtail catheter 01/26/2017.    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  This has been proven by prior drain injection, most recently 03/17/2017.    Dr. Lucia Gaskins is his surgeon.  His wife tells me he has an appointment on 04/23/2017.    He is not flushing the drain at this point.  He continuous to have foul-smelling output from the drain.  He was recently treated for UTI.    Drain injection performed today at [facility] confirms persisting fistula.  No large abscess cavity.   Plan: - Maintain the drain for now.  Do not flush - Observe follow up appointment with Dr. Lucia Gaskins.  - If further imaging is needed, would not repeat for 4 weeks.    Signed,  Austin Fanny. Earleen Newport, DO

## 2017-04-17 MED FILL — VANCOMYCIN 50MG/ML-ORAL SOL: 14 days supply | Qty: 40 | Fill #3

## 2017-04-23 DIAGNOSIS — G8250 Quadriplegia, unspecified: Secondary | ICD-10-CM | POA: Diagnosis not present

## 2017-04-23 DIAGNOSIS — C649 Malignant neoplasm of unspecified kidney, except renal pelvis: Secondary | ICD-10-CM | POA: Diagnosis not present

## 2017-04-23 DIAGNOSIS — K5720 Diverticulitis of large intestine with perforation and abscess without bleeding: Secondary | ICD-10-CM | POA: Diagnosis not present

## 2017-04-23 DIAGNOSIS — C7989 Secondary malignant neoplasm of other specified sites: Secondary | ICD-10-CM | POA: Diagnosis not present

## 2017-04-23 DIAGNOSIS — Z7901 Long term (current) use of anticoagulants: Secondary | ICD-10-CM | POA: Diagnosis not present

## 2017-04-24 ENCOUNTER — Other Ambulatory Visit: Payer: PPO

## 2017-04-27 ENCOUNTER — Other Ambulatory Visit: Payer: Self-pay | Admitting: Family Medicine

## 2017-04-29 ENCOUNTER — Encounter (HOSPITAL_COMMUNITY): Payer: Self-pay

## 2017-04-29 ENCOUNTER — Other Ambulatory Visit: Payer: Self-pay | Admitting: Surgery

## 2017-04-29 ENCOUNTER — Encounter (HOSPITAL_COMMUNITY)
Admission: RE | Admit: 2017-04-29 | Discharge: 2017-04-29 | Disposition: A | Discharge status: Home / Self Care | Payer: PPO | Source: Ambulatory Visit | Attending: Surgery | Admitting: Surgery

## 2017-04-29 DIAGNOSIS — Z01812 Encounter for preprocedural laboratory examination: Secondary | ICD-10-CM | POA: Diagnosis not present

## 2017-04-29 HISTORY — DX: Disruption of external operation (surgical) wound, not elsewhere classified, initial encounter: T81.31XA

## 2017-04-29 LAB — CBC
HCT: 38 % — ABNORMAL LOW (ref 39.0–52.0)
Hemoglobin: 12.3 g/dL — ABNORMAL LOW (ref 13.0–17.0)
MCH: 26.8 pg (ref 26.0–34.0)
MCHC: 32.4 g/dL (ref 30.0–36.0)
MCV: 82.8 fL (ref 78.0–100.0)
PLATELETS: 227 10*3/uL (ref 150–400)
RBC: 4.59 MIL/uL (ref 4.22–5.81)
RDW: 17.3 % — AB (ref 11.5–15.5)
WBC: 11.2 10*3/uL — ABNORMAL HIGH (ref 4.0–10.5)

## 2017-04-29 LAB — BASIC METABOLIC PANEL
Anion gap: 9 (ref 5–15)
BUN: 34 mg/dL — AB (ref 6–20)
CHLORIDE: 104 mmol/L (ref 101–111)
CO2: 20 mmol/L — AB (ref 22–32)
CREATININE: 1.41 mg/dL — AB (ref 0.61–1.24)
Calcium: 9.1 mg/dL (ref 8.9–10.3)
GFR calc Af Amer: >60 mL/min (ref >60)
GFR calc non Af Amer: 52 mL/min — ABNORMAL LOW (ref >60)
Glucose, Bld: 93 mg/dL (ref 65–99)
Potassium: 5.4 mmol/L — ABNORMAL HIGH (ref 3.5–5.1)
Sodium: 133 mmol/L — ABNORMAL LOW (ref 135–145)

## 2017-04-29 LAB — GLUCOSE, CAPILLARY: Glucose-Capillary: 102 mg/dL — ABNORMAL HIGH (ref 65–99)

## 2017-04-29 LAB — HEMOGLOBIN A1C
HEMOGLOBIN A1C: 6.2 % — AB (ref 4.8–5.6)
Mean Plasma Glucose: 131.24 mg/dL

## 2017-04-29 NOTE — Progress Notes (Signed)
ekg 09-27-16 epic

## 2017-04-29 NOTE — Consult Note (Signed)
[place] Nurse ostomy consult note  [place] Nurse requested for preoperative stoma site marking by Dr. Keturah Barre. [place].  Discussed surgical procedure and stoma creation with patient and wife.  Explained role of the [place] nurse team.  Answered patient and family questions.   Examined patient sitting in motorized wheelchair in order to place the marking in the patient's visual field, away from any creases or abdominal contour issues and within the rectus muscle.    First choice:  Marked for colostomy in the LUQ 7cm to the left of the umbilicus and 94TM above the umbilicus.  Second choice: Marked for ileostomy in the RUQ  7.5cm to the right of the umbilicus and 8.0 cm above the umbilicus.  Patient's abdomen cleansed with CHG wipes at site markings, allowed to air dry prior to marking.Covered marks with thin film transparent dressing to preserve mark until date of surgery (Monday, 05/04/17).   [place] Nurse team will follow up with patient after surgery for continue ostomy care and teaching.   Thank you for allowing me to meet and mark this nice gentleman prior to his surgery. Maudie Flakes, MSN, RN, [place], [facility]  Pager# [phone]

## 2017-04-29 NOTE — Patient Instructions (Addendum)
Austin [name]  04/29/2017   Your procedure is scheduled on: 05/04/2017    Report to [facility] Main  Entrance Take [place]  elevators to 3rd floor to  [place] at    1030 AM.    Call this number if you have problems the morning of surgery [phone]    Remember: ONLY 1 PERSON MAY GO WITH YOU TO SHORT STAY TO GET  READY MORNING OF YOUR SURGERY.  Do not eat food  :After Midnight.after midnight saturdday night clear liquids all day Sunday 05-03-17 per dr Berkley Harvey instructions, no clear liquids after midnight, follow all Dr. Pollie Friar bowel prep instructions given.      CLEAR LIQUID DIET   Foods Allowed                                                                     Foods Excluded  Coffee and tea, regular and decaf                             liquids that you cannot  Plain Jell-O in any flavor                                             see through such as: Fruit ices (not with fruit pulp)                                     milk, soups, orange juice  Iced Popsicles                                    All solid food Carbonated beverages, regular and diet                                    Cranberry, grape and apple juices Sports drinks like Gatorade, no red or purple Lightly seasoned clear broth or consume(fat free) Sugar, honey syrup  Sample Menu Breakfast                                Lunch                                     Supper Cranberry juice                    Beef broth                            Chicken broth Jell-O  Grape juice                           Apple juice Coffee or tea                        Jell-O                                      Popsicle                                                Coffee or tea                        Coffee or tea  _____________________________________________________________________     Take these medicines the morning of surgery with A SIP OF WATER: Albuterol  Inhaler if needed and bring, Lexapro,                                You may not have any metal on your body including hair pins and              piercings  Do not wear jewelry,  lotions, powders or perfumes, deodorant                        Men may shave face and neck.   Do not bring valuables to the hospital. [place].  Contacts, dentures or bridgework may not be worn into surgery.  Leave suitcase in the car. After surgery it may be brought to your room.                   Please read over the following fact sheets you were given: _____________________________________________________________________             [facility] - Preparing for Surgery Before surgery, you can play an important role.  Because skin is not sterile, your skin needs to be as free of germs as possible.  You can reduce the number of germs on your skin by washing with CHG (chlorahexidine gluconate) soap before surgery.  CHG is an antiseptic cleaner which kills germs and bonds with the skin to continue killing germs even after washing. Please DO NOT use if you have an allergy to CHG or antibacterial soaps.  If your skin becomes reddened/irritated stop using the CHG and inform your nurse when you arrive at Short Stay. Do not shave (including legs and underarms) for at least 48 hours prior to the first CHG shower.  You may shave your face/neck. Please follow these instructions carefully:  1.  Shower with CHG Soap the night before surgery and the  morning of Surgery.  2.  If you choose to wash your hair, wash your hair first as usual with your  normal  shampoo.  3.  After you shampoo, rinse your hair and body thoroughly to remove the  shampoo.  4.  Use CHG as you would any other liquid soap.  You can apply chg directly  to the skin and wash                       Gently with a scrungie or clean washcloth.  5.  Apply the CHG Soap to your body ONLY  FROM THE NECK DOWN.   Do not use on face/ open                           Wound or open sores. Avoid contact with eyes, ears mouth and genitals (private parts).                       Wash face,  Genitals (private parts) with your normal soap.             6.  Wash thoroughly, paying special attention to the area where your surgery  will be performed.  7.  Thoroughly rinse your body with warm water from the neck down.  8.  DO NOT shower/wash with your normal soap after using and rinsing off  the CHG Soap.                9.  Pat yourself dry with a clean towel.            10.  Wear clean pajamas.            11.  Place clean sheets on your bed the night of your first shower and do not  sleep with pets. Day of Surgery : Do not apply any lotions/deodorants the morning of surgery.  Please wear clean clothes to the hospital/surgery center.  FAILURE TO FOLLOW THESE INSTRUCTIONS MAY RESULT IN THE CANCELLATION OF YOUR SURGERY PATIENT SIGNATURE_________________________________  NURSE SIGNATURE__________________________________  ________________________________________________________________________   Austin [name]  An incentive spirometer is a tool that can help keep your lungs clear and active. This tool measures how well you are filling your lungs with each breath. Taking long deep breaths may help reverse or decrease the chance of developing breathing (pulmonary) problems (especially infection) following:  A long period of time when you are unable to move or be active. BEFORE THE PROCEDURE   If the spirometer includes an indicator to show your best effort, your nurse or respiratory therapist will set it to a desired goal.  If possible, sit up straight or lean slightly forward. Try not to slouch.  Hold the incentive spirometer in an upright position. INSTRUCTIONS FOR USE  1. Sit on the edge of your bed if possible, or sit up as far as you can in bed or on a chair. 2. Hold the incentive  spirometer in an upright position. 3. Breathe out normally. 4. Place the mouthpiece in your mouth and seal your lips tightly around it. 5. Breathe in slowly and as deeply as possible, raising the piston or the ball toward the top of the column. 6. Hold your breath for 3-5 seconds or for as long as possible. Allow the piston or ball to fall to the bottom of the column. 7. Remove the mouthpiece from your mouth and breathe out normally. 8. Rest for a few seconds and repeat Steps 1 through 7 at least 10 times every 1-2 hours when you are awake. Take your time and take a few normal breaths between deep breaths. 9. The spirometer may include an indicator to  show your best effort. Use the indicator as a goal to work toward during each repetition. 10. After each set of 10 deep breaths, practice coughing to be sure your lungs are clear. If you have an incision (the cut made at the time of surgery), support your incision when coughing by placing a pillow or rolled up towels firmly against it. Once you are able to get out of bed, walk around indoors and cough well. You may stop using the incentive spirometer when instructed by your caregiver.  RISKS AND COMPLICATIONS  Take your time so you do not get dizzy or light-headed.  If you are in pain, you may need to take or ask for pain medication before doing incentive spirometry. It is harder to take a deep breath if you are having pain. AFTER USE  Rest and breathe slowly and easily.  It can be helpful to keep track of a log of your progress. Your caregiver can provide you with a simple table to help with this. If you are using the spirometer at home, follow these instructions: [place] IF:   You are having difficultly using the spirometer.  You have trouble using the spirometer as often as instructed.  Your pain medication is not giving enough relief while using the spirometer.  You develop fever of 100.5 F (38.1 C) or higher. SEEK  IMMEDIATE MEDICAL CARE IF:   You cough up bloody sputum that had not been present before.  You develop fever of 102 F (38.9 C) or greater.  You develop worsening pain at or near the incision site. MAKE SURE YOU:   Understand these instructions.  Will watch your condition.  Will get help right away if you are not doing well or get worse. Document Released: 12/08/2006 Document Revised: 10/20/2011 Document Reviewed: 02/08/2007 ExitCare Patient Information 2014 ExitCare, [place].   ________________________________________________________________________  WHAT IS A BLOOD TRANSFUSION? Blood Transfusion Information  A transfusion is the replacement of blood or some of its parts. Blood is made up of multiple cells which provide different functions.  Red blood cells carry oxygen and are used for blood loss replacement.  White blood cells fight against infection.  Platelets control bleeding.  Plasma helps clot blood.  Other blood products are available for specialized needs, such as hemophilia or other clotting disorders. BEFORE THE TRANSFUSION  Who gives blood for transfusions?   Healthy volunteers who are fully evaluated to make sure their blood is safe. This is blood bank blood. Transfusion therapy is the safest it has ever been in the practice of medicine. Before blood is taken from a donor, a complete history is taken to make sure that person has no history of diseases nor engages in risky social behavior (examples are intravenous drug use or sexual activity with multiple partners). The donor's travel history is screened to minimize risk of transmitting infections, such as malaria. The donated blood is tested for signs of infectious diseases, such as HIV and hepatitis. The blood is then tested to be sure it is compatible with you in order to minimize the chance of a transfusion reaction. If you or a relative donates blood, this is often done in anticipation of surgery and is not  appropriate for emergency situations. It takes many days to process the donated blood. RISKS AND COMPLICATIONS Although transfusion therapy is very safe and saves many lives, the main dangers of transfusion include:   Getting an infectious disease.  Developing a transfusion reaction. This is an allergic reaction  to something in the blood you were given. Every precaution is taken to prevent this. The decision to have a blood transfusion has been considered carefully by your caregiver before blood is given. Blood is not given unless the benefits outweigh the risks. AFTER THE TRANSFUSION  Right after receiving a blood transfusion, you will usually feel much better and more energetic. This is especially true if your red blood cells have gotten low (anemic). The transfusion raises the level of the red blood cells which carry oxygen, and this usually causes an energy increase.  The nurse administering the transfusion will monitor you carefully for complications. HOME CARE INSTRUCTIONS  No special instructions are needed after a transfusion. You may find your energy is better. Speak with your caregiver about any limitations on activity for underlying diseases you may have. SEEK MEDICAL CARE IF:   Your condition is not improving after your transfusion.  You develop redness or irritation at the intravenous (IV) site. SEEK IMMEDIATE MEDICAL CARE IF:  Any of the following symptoms occur over the next 12 hours:  Shaking chills.  You have a temperature by mouth above 102 F (38.9 C), not controlled by medicine.  Chest, back, or muscle pain.  People around you feel you are not acting correctly or are confused.  Shortness of breath or difficulty breathing.  Dizziness and fainting.  You get a rash or develop hives.  You have a decrease in urine output.  Your urine turns a dark color or changes to pink, red, or brown. Any of the following symptoms occur over the next 10 days:  You have a  temperature by mouth above 102 F (38.9 C), not controlled by medicine.  Shortness of breath.  Weakness after normal activity.  The white part of the eye turns yellow (jaundice).  You have a decrease in the amount of urine or are urinating less often.  Your urine turns a dark color or changes to pink, red, or brown. Document Released: 07/25/2000 Document Revised: 10/20/2011 Document Reviewed: 03/13/2008 [facility] Patient Information 2014 [place], [place].  _______________________________________________________________________

## 2017-04-30 NOTE — Progress Notes (Signed)
bmet results routed to dr Jamesetta Orleans by epic

## 2017-05-03 NOTE — H&P (Signed)
Austin [name]  Location: [facility] Patient #: [id number] DOB: 12/14/1955 Married / Language: English / Race: White Male  History of Present Illness   The patient is a 61 year old male who presents with a complaint of recurrent diverticular abscess.  The PCP is Dr. Girtha Hake  HIs wife is with him.  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 He is ready to go ahead with surgery. We have discussed the different options which include removing the drain and seeing how he does versus continued leave the drain versus resecting the damaged sigmoid colon. Because of his quadriplegia, I think he be best served with an end colostomy and not doing a primary anastomosis. They agree. I went over with him the indications and risks of surgery. The risk of major: Surgery includes bleeding, infection, nerve injury, recurrent abscesses, and wound complications. Because of his quadriplegia, any surgery is high risk for other problems such as pneumonia or stroke. His renal cell carcinoma has been surprisingly stable. He saw Dr. Osker Mason on 3 August and and the plans are just to continue observation. He saw Dr. [name] on 6 August for follow-up.  Plan: 1) Plan sigmoid colon resection with permanent end colostomy, 2) Stop plavix 7 days before surgery, 3) Bowel prep - mechanical and antibiotic - for surgery, 4) Ostomy nurse to mark patient for surgery  History of diverticular disease. He has done well since going home from the hospital. He is tolerating the perk drain well. But because of persistent fistula with a drain, we are trying to decide our next step. He is on a long course of vancomycin to treat the C. difficile which will be daily for about a month, then every other day for about 2 months.  Perc drain of diverticular abscess on 01/26/2017 at [facility]. Images on 02/12/2017 show fistula track between the sigmoid colon  and the catheter. Repeat fistulogram (03/17/2017) shows still a fistula track between the sigmoid colon and the catheter.   Past Medical History: 1. Diverticulitis with focal abscess 2 episodes. - February 2018 and June 2018 Saw Dr. Hilarie Fredrickson - colonscopy 01/2012 - he had bleeding post op, which required a second procedure. 2. C. Diff - recurrent 3. Recurrent Metastatic renal cell cancer, s/p left radical nephrectomy 04/23/2012 - Dr. Tresa Moore Peritoneal metastasis 2015 Followed by Dr. Jerilynn Mages 4. 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 Creatinine - 1.26 - 01/27/2017 6. On Plavix 7. DM - on no meds now - he has lost weight with all of his illnesses 8. Pituitary tumor in 1994 9. Prior cholecystectomy  Social History: Wife is with him. She has Crohn's disease and sees Dr. Hilarie Fredrickson. He has 3 children. I have met Heather. He has two other children - who live in [place] (?)  His brother died of colon cancer. His mother and sister are coming this way for Thanksgiving - they're hoping to have much of this behind them by then.   Allergies (April Staton, CMA; 04/23/2017 4:53 PM) Penicillin G Benzathine & Proc *PENICILLINS*  Allergies Reconciled   Medication History (April Staton, CMA; 04/23/2017 4:53 PM) Atorvastatin Calcium (20MG Tablet, Oral) Active. Clopidogrel Bisulfate (75MG Tablet, Oral) Active. Testosterone Enanthate (200MG/ML Solution, Intramuscular) Active. Escitalopram Oxalate (10MG Tablet, Oral) Active. Ketoconazole (2% Cream, External) Active. Lisinopril-Hydrochlorothiazide (20-12.5MG Tablet, Oral) Active. Vancomycin HCl (250MG Capsule, Oral) Active. TraZODone HCl (100MG Tablet, Oral) Active. Medications Reconciled  Vitals (April Staton CMA;  04/23/2017 4:53 PM) 04/23/2017 4:53 PM Weight: 173 lb Height: 73in Body Surface Area: 2.02 m Body Mass  Index: 22.82 kg/m  Temp.: 98.73F(Oral)  Pulse: 74 (Regular)  BP: 124/70 (Sitting, Left Arm, Standard)   Physical Exam  General: WM who is wheel chair bound alert. I examined him in his wheel chair. The wheel chair reclines. HEENT: Normal. Pupils equal.  Neck: Supple. No mass. No thyroid mass.  Lymph Nodes: No supraclavicular or cervical nodes.  Lungs: Clear to auscultation and symmetric breath sounds. Heart: RRR. No murmur or rub.  Abdomen: Soft. No mass. No tenderness. No hernia. Normal bowel sounds.  Has scars from prior midline incision. He has a hernia in the midline incision. He also has a "PEG" scar in the LUQ (His wife seemed to think it was more central - but I'm not sure) Has drian in left lower quadrant - has stool like material in the bag Rectal: Not done.  Extremities: Wheel chair bound. Contracted left hand/arm, limited motion of right hand  Psychiatric: Has normal mood. His speech pattern is hard to follow, but his wife does a good job of interpreting his speech.   Assessment & Plan  1.  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  METASTATIC RENAL CELL CARCINOMA TO INTRA-ABDOMINAL SITE (C79.89)  s/p left radical nephrectomy 04/23/2012 - Dr. Tresa Moore  Peritoneal metastasis 2015 Followed by Dr. Jerilynn Mages 3. ANTICOAGULATED (Z79.01)  Impression: On plavix 4.  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 C. Diff - recurrent 6. DM - on no meds now - he has lost weight with all of his illnesses 7.  Pituitary tumor in 1994 8.  He uses an inflatable condom cath at home. So I may leave a foley in him for much of his hospitalization.  Discussed this with patient and wife  Alphonsa Overall, MD, [facility] Surgery Pager: [phone] Office phone:  [phone]

## 2017-05-04 ENCOUNTER — Inpatient Hospital Stay (HOSPITAL_COMMUNITY)
Admission: RE | Admit: 2017-05-04 | Discharge: 2017-05-08 | DRG: 329 | Disposition: A | Discharge status: Home / Self Care | Payer: PPO | Source: Ambulatory Visit | Attending: Surgery | Admitting: Surgery

## 2017-05-04 ENCOUNTER — Inpatient Hospital Stay (HOSPITAL_COMMUNITY): Payer: PPO | Admitting: Certified Registered Nurse Anesthetist

## 2017-05-04 ENCOUNTER — Encounter (HOSPITAL_COMMUNITY)
Admission: RE | Disposition: A | Discharge status: Home / Self Care | Payer: Self-pay | Source: Ambulatory Visit | Attending: Surgery

## 2017-05-04 ENCOUNTER — Encounter (HOSPITAL_COMMUNITY): Payer: Self-pay | Admitting: Certified Registered Nurse Anesthetist

## 2017-05-04 DIAGNOSIS — A0471 Enterocolitis due to Clostridium difficile, recurrent: Secondary | ICD-10-CM | POA: Diagnosis not present

## 2017-05-04 DIAGNOSIS — Z880 Allergy status to penicillin: Secondary | ICD-10-CM | POA: Diagnosis not present

## 2017-05-04 DIAGNOSIS — I10 Essential (primary) hypertension: Secondary | ICD-10-CM | POA: Diagnosis present

## 2017-05-04 DIAGNOSIS — C801 Malignant (primary) neoplasm, unspecified: Secondary | ICD-10-CM | POA: Diagnosis not present

## 2017-05-04 DIAGNOSIS — C786 Secondary malignant neoplasm of retroperitoneum and peritoneum: Secondary | ICD-10-CM | POA: Diagnosis present

## 2017-05-04 DIAGNOSIS — K5720 Diverticulitis of large intestine with perforation and abscess without bleeding: Secondary | ICD-10-CM | POA: Diagnosis not present

## 2017-05-04 DIAGNOSIS — Z8673 Personal history of transient ischemic attack (TIA), and cerebral infarction without residual deficits: Secondary | ICD-10-CM

## 2017-05-04 DIAGNOSIS — K660 Peritoneal adhesions (postprocedural) (postinfection): Secondary | ICD-10-CM | POA: Diagnosis present

## 2017-05-04 DIAGNOSIS — C7989 Secondary malignant neoplasm of other specified sites: Secondary | ICD-10-CM | POA: Diagnosis not present

## 2017-05-04 DIAGNOSIS — D649 Anemia, unspecified: Secondary | ICD-10-CM | POA: Diagnosis present

## 2017-05-04 DIAGNOSIS — Z7902 Long term (current) use of antithrombotics/antiplatelets: Secondary | ICD-10-CM | POA: Diagnosis not present

## 2017-05-04 DIAGNOSIS — Z905 Acquired absence of kidney: Secondary | ICD-10-CM

## 2017-05-04 DIAGNOSIS — Z87891 Personal history of nicotine dependence: Secondary | ICD-10-CM | POA: Diagnosis not present

## 2017-05-04 DIAGNOSIS — E875 Hyperkalemia: Secondary | ICD-10-CM | POA: Diagnosis present

## 2017-05-04 DIAGNOSIS — C7900 Secondary malignant neoplasm of unspecified kidney and renal pelvis: Secondary | ICD-10-CM | POA: Diagnosis not present

## 2017-05-04 DIAGNOSIS — K5090 Crohn's disease, unspecified, without complications: Secondary | ICD-10-CM | POA: Diagnosis not present

## 2017-05-04 DIAGNOSIS — Z800 Family history of malignant neoplasm of digestive organs: Secondary | ICD-10-CM

## 2017-05-04 DIAGNOSIS — G8250 Quadriplegia, unspecified: Secondary | ICD-10-CM | POA: Diagnosis present

## 2017-05-04 DIAGNOSIS — Z993 Dependence on wheelchair: Secondary | ICD-10-CM | POA: Diagnosis not present

## 2017-05-04 DIAGNOSIS — C649 Malignant neoplasm of unspecified kidney, except renal pelvis: Secondary | ICD-10-CM | POA: Diagnosis present

## 2017-05-04 DIAGNOSIS — K632 Fistula of intestine: Secondary | ICD-10-CM | POA: Diagnosis present

## 2017-05-04 DIAGNOSIS — Z79899 Other long term (current) drug therapy: Secondary | ICD-10-CM

## 2017-05-04 DIAGNOSIS — E119 Type 2 diabetes mellitus without complications: Secondary | ICD-10-CM | POA: Diagnosis present

## 2017-05-04 DIAGNOSIS — K5732 Diverticulitis of large intestine without perforation or abscess without bleeding: Secondary | ICD-10-CM | POA: Diagnosis not present

## 2017-05-04 HISTORY — DX: Quadriplegia, unspecified: G82.50

## 2017-05-04 HISTORY — PX: COLON RESECTION: SHX5231

## 2017-05-04 HISTORY — DX: Pneumonia, unspecified organism: J18.9

## 2017-05-04 LAB — TYPE AND SCREEN
ABO/RH(D): O POS
Antibody Screen: NEGATIVE

## 2017-05-04 LAB — GLUCOSE, CAPILLARY: GLUCOSE-CAPILLARY: 86 mg/dL (ref 65–99)

## 2017-05-04 LAB — MRSA PCR SCREENING: MRSA by PCR: NEGATIVE

## 2017-05-04 SURGERY — LAPAROSCOPIC SIGMOID COLON RESECTION
Anesthesia: General

## 2017-05-04 MED ORDER — PROPOFOL 10 MG/ML IV BOLUS
INTRAVENOUS | Status: AC
  Filled 2017-05-04: qty 20

## 2017-05-04 MED ORDER — SCOPOLAMINE 1 MG/3DAYS TD PT72
MEDICATED_PATCH | TRANSDERMAL | Status: DC | PRN
  Administered 2017-05-04: 1 via TRANSDERMAL

## 2017-05-04 MED ORDER — LACTATED RINGERS IV SOLN
INTRAVENOUS | Status: DC
  Administered 2017-05-04 (×3): via INTRAVENOUS

## 2017-05-04 MED ORDER — SODIUM CHLORIDE 0.9 % IJ SOLN
INTRAMUSCULAR | Status: AC
Start: 2017-05-04 — End: 2017-05-04
  Filled 2017-05-04: qty 50

## 2017-05-04 MED ORDER — LIDOCAINE 2% (20 MG/ML) 5 ML SYRINGE
INTRAMUSCULAR | Status: DC | PRN
  Administered 2017-05-04: 80 mg via INTRAVENOUS

## 2017-05-04 MED ORDER — BUPIVACAINE-EPINEPHRINE 0.25% -1:200000 IJ SOLN
INTRAMUSCULAR | Status: DC | PRN
  Administered 2017-05-04: 30 mL

## 2017-05-04 MED ORDER — LIDOCAINE 2% (20 MG/ML) 5 ML SYRINGE
INTRAMUSCULAR | Status: DC | PRN
Start: 2017-05-04 — End: 2017-05-04
  Administered 2017-05-04: 1.5 mg/kg/h via INTRAVENOUS

## 2017-05-04 MED ORDER — FENTANYL CITRATE (PF) 100 MCG/2ML IJ SOLN
INTRAMUSCULAR | Status: DC | PRN
  Administered 2017-05-04: 50 ug via INTRAVENOUS
  Administered 2017-05-04: 100 ug via INTRAVENOUS
  Administered 2017-05-04 (×2): 50 ug via INTRAVENOUS

## 2017-05-04 MED ORDER — KCL IN DEXTROSE-NACL 20-5-0.45 MEQ/L-%-% IV SOLN
INTRAVENOUS | Status: AC
  Filled 2017-05-04: qty 1000

## 2017-05-04 MED ORDER — PROPOFOL 10 MG/ML IV BOLUS
INTRAVENOUS | Status: DC | PRN
  Administered 2017-05-04: 150 mg via INTRAVENOUS

## 2017-05-04 MED ORDER — BUPIVACAINE-EPINEPHRINE 0.5% -1:200000 IJ SOLN
INTRAMUSCULAR | Status: AC
  Filled 2017-05-04: qty 1

## 2017-05-04 MED ORDER — LABETALOL HCL 5 MG/ML IV SOLN
INTRAVENOUS | Status: AC
  Filled 2017-05-04: qty 4

## 2017-05-04 MED ORDER — CEFOTETAN DISODIUM-DEXTROSE 2-2.08 GM-% IV SOLR
INTRAVENOUS | Status: AC
  Filled 2017-05-04: qty 50

## 2017-05-04 MED ORDER — SUGAMMADEX SODIUM 200 MG/2ML IV SOLN
INTRAVENOUS | Status: DC | PRN
Start: 2017-05-04 — End: 2017-05-04
  Administered 2017-05-04: 200 mg via INTRAVENOUS

## 2017-05-04 MED ORDER — BUPIVACAINE LIPOSOME 1.3 % IJ SUSP
20.0000 mL | Freq: Once | INTRAMUSCULAR | Status: AC
  Administered 2017-05-04: 20 mL
  Filled 2017-05-04: qty 20

## 2017-05-04 MED ORDER — ENOXAPARIN SODIUM 40 MG/0.4ML ~~LOC~~ SOLN
40.0000 mg | SUBCUTANEOUS | Status: DC
  Administered 2017-05-05 – 2017-05-08 (×4): 40 mg via SUBCUTANEOUS
  Filled 2017-05-04 (×4): qty 0.4

## 2017-05-04 MED ORDER — GABAPENTIN 300 MG PO CAPS
300.0000 mg | ORAL_CAPSULE | ORAL | Status: AC
  Administered 2017-05-04: 300 mg via ORAL
  Filled 2017-05-04: qty 1

## 2017-05-04 MED ORDER — DEXAMETHASONE SODIUM PHOSPHATE 4 MG/ML IJ SOLN
INTRAMUSCULAR | Status: DC | PRN
  Administered 2017-05-04: 8 mg via INTRAVENOUS

## 2017-05-04 MED ORDER — HYDROCODONE-ACETAMINOPHEN 5-325 MG PO TABS
1.0000 | ORAL_TABLET | ORAL | Status: DC | PRN
  Administered 2017-05-04: 2 via ORAL
  Filled 2017-05-04: qty 2

## 2017-05-04 MED ORDER — MIDAZOLAM HCL 2 MG/2ML IJ SOLN
INTRAMUSCULAR | Status: AC
  Filled 2017-05-04: qty 2

## 2017-05-04 MED ORDER — LIDOCAINE 2% (20 MG/ML) 5 ML SYRINGE
INTRAMUSCULAR | Status: AC
  Filled 2017-05-04: qty 5

## 2017-05-04 MED ORDER — ONDANSETRON HCL 4 MG PO TABS: 4.0000 mg | ORAL_TABLET | Freq: Four times a day (QID) | ORAL | Status: DC | PRN

## 2017-05-04 MED ORDER — ACETAMINOPHEN 500 MG PO TABS
1000.0000 mg | ORAL_TABLET | ORAL | Status: AC
  Administered 2017-05-04: 1000 mg via ORAL
  Filled 2017-05-04: qty 2

## 2017-05-04 MED ORDER — ALVIMOPAN 12 MG PO CAPS
12.0000 mg | ORAL_CAPSULE | Freq: Once | ORAL | Status: AC
  Administered 2017-05-04: 12 mg via ORAL
  Filled 2017-05-04: qty 1

## 2017-05-04 MED ORDER — ONDANSETRON HCL 4 MG/2ML IJ SOLN: 4.0000 mg | Freq: Four times a day (QID) | INTRAMUSCULAR | Status: DC | PRN

## 2017-05-04 MED ORDER — PHENYLEPHRINE 40 MCG/ML (10ML) SYRINGE FOR IV PUSH (FOR BLOOD PRESSURE SUPPORT)
PREFILLED_SYRINGE | INTRAVENOUS | Status: DC | PRN
  Administered 2017-05-04 (×3): 80 ug via INTRAVENOUS

## 2017-05-04 MED ORDER — CHLORHEXIDINE GLUCONATE 4 % EX LIQD: 60.0000 mL | Freq: Once | CUTANEOUS | Status: DC

## 2017-05-04 MED ORDER — ALBUTEROL SULFATE HFA 108 (90 BASE) MCG/ACT IN AERS
INHALATION_SPRAY | RESPIRATORY_TRACT | Status: AC
  Filled 2017-05-04: qty 6.7

## 2017-05-04 MED ORDER — MIDAZOLAM HCL 5 MG/5ML IJ SOLN
INTRAMUSCULAR | Status: DC | PRN
  Administered 2017-05-04 (×2): 1 mg via INTRAVENOUS

## 2017-05-04 MED ORDER — ALBUMIN HUMAN 5 % IV SOLN
INTRAVENOUS | Status: AC
  Filled 2017-05-04: qty 250

## 2017-05-04 MED ORDER — FENTANYL CITRATE (PF) 100 MCG/2ML IJ SOLN: 25.0000 ug | INTRAMUSCULAR | Status: DC | PRN

## 2017-05-04 MED ORDER — LACTATED RINGERS IR SOLN
Status: DC | PRN
  Administered 2017-05-04: 1000 mL

## 2017-05-04 MED ORDER — SCOPOLAMINE 1 MG/3DAYS TD PT72
MEDICATED_PATCH | TRANSDERMAL | Status: AC
  Filled 2017-05-04: qty 1

## 2017-05-04 MED ORDER — ACETAMINOPHEN 325 MG PO TABS
650.0000 mg | ORAL_TABLET | Freq: Four times a day (QID) | ORAL | Status: DC
  Administered 2017-05-04 – 2017-05-08 (×12): 650 mg via ORAL
  Filled 2017-05-04 (×13): qty 2

## 2017-05-04 MED ORDER — HEPARIN SODIUM (PORCINE) 5000 UNIT/ML IJ SOLN
5000.0000 [IU] | Freq: Once | INTRAMUSCULAR | Status: AC
  Administered 2017-05-04: 5000 [IU] via SUBCUTANEOUS
  Filled 2017-05-04: qty 1

## 2017-05-04 MED ORDER — ONDANSETRON HCL 4 MG/2ML IJ SOLN
INTRAMUSCULAR | Status: DC | PRN
  Administered 2017-05-04: 4 mg via INTRAVENOUS

## 2017-05-04 MED ORDER — CEFOTETAN DISODIUM-DEXTROSE 2-2.08 GM-% IV SOLR
2.0000 g | INTRAVENOUS | Status: AC
  Administered 2017-05-04: 2 g via INTRAVENOUS
  Filled 2017-05-04: qty 50

## 2017-05-04 MED ORDER — PROMETHAZINE HCL 25 MG/ML IJ SOLN: 6.2500 mg | INTRAMUSCULAR | Status: DC | PRN

## 2017-05-04 MED ORDER — 0.9 % SODIUM CHLORIDE (POUR BTL) OPTIME
TOPICAL | Status: DC | PRN
  Administered 2017-05-04: 2000 mL

## 2017-05-04 MED ORDER — ALBUTEROL SULFATE (2.5 MG/3ML) 0.083% IN NEBU: 3.0000 mL | INHALATION_SOLUTION | Freq: Four times a day (QID) | RESPIRATORY_TRACT | Status: DC | PRN

## 2017-05-04 MED ORDER — MORPHINE SULFATE (PF) 4 MG/ML IV SOLN
1.0000 mg | INTRAVENOUS | Status: DC | PRN
  Administered 2017-05-04: 2 mg via INTRAVENOUS
  Filled 2017-05-04: qty 1

## 2017-05-04 MED ORDER — PHENYLEPHRINE 40 MCG/ML (10ML) SYRINGE FOR IV PUSH (FOR BLOOD PRESSURE SUPPORT)
PREFILLED_SYRINGE | INTRAVENOUS | Status: AC
Start: 2017-05-04 — End: 2017-05-04
  Filled 2017-05-04: qty 10

## 2017-05-04 MED ORDER — DEXAMETHASONE SODIUM PHOSPHATE 10 MG/ML IJ SOLN
INTRAMUSCULAR | Status: AC
Start: 2017-05-04 — End: 2017-05-04
  Filled 2017-05-04: qty 1

## 2017-05-04 MED ORDER — LACTATED RINGERS IR SOLN
Status: DC | PRN
  Administered 2017-05-04: 2000 mL

## 2017-05-04 MED ORDER — ALBUMIN HUMAN 5 % IV SOLN
INTRAVENOUS | Status: DC | PRN
  Administered 2017-05-04 (×2): via INTRAVENOUS

## 2017-05-04 MED ORDER — ALVIMOPAN 12 MG PO CAPS
12.0000 mg | ORAL_CAPSULE | Freq: Two times a day (BID) | ORAL | Status: DC
  Filled 2017-05-04: qty 1

## 2017-05-04 MED ORDER — BUPIVACAINE-EPINEPHRINE (PF) 0.25% -1:200000 IJ SOLN
INTRAMUSCULAR | Status: AC
  Filled 2017-05-04: qty 30

## 2017-05-04 MED ORDER — LIDOCAINE HCL 2 % IJ SOLN
INTRAMUSCULAR | Status: AC
  Filled 2017-05-04: qty 20

## 2017-05-04 MED ORDER — ROCURONIUM BROMIDE 50 MG/5ML IV SOSY
PREFILLED_SYRINGE | INTRAVENOUS | Status: AC
Start: 2017-05-04 — End: 2017-05-04
  Filled 2017-05-04: qty 5

## 2017-05-04 MED ORDER — ALBUTEROL SULFATE HFA 108 (90 BASE) MCG/ACT IN AERS
INHALATION_SPRAY | RESPIRATORY_TRACT | Status: DC | PRN
  Administered 2017-05-04: 2 via RESPIRATORY_TRACT

## 2017-05-04 MED ORDER — KCL IN DEXTROSE-NACL 20-5-0.45 MEQ/L-%-% IV SOLN
INTRAVENOUS | Status: DC
Start: 2017-05-04 — End: 2017-05-05
  Administered 2017-05-04: 20:00:00 via INTRAVENOUS
  Filled 2017-05-04 (×2): qty 1000

## 2017-05-04 MED ORDER — FENTANYL CITRATE (PF) 250 MCG/5ML IJ SOLN
INTRAMUSCULAR | Status: AC
  Filled 2017-05-04: qty 5

## 2017-05-04 MED ORDER — SACCHAROMYCES BOULARDII 250 MG PO CAPS
250.0000 mg | ORAL_CAPSULE | Freq: Two times a day (BID) | ORAL | Status: DC
  Administered 2017-05-05 – 2017-05-08 (×7): 250 mg via ORAL
  Filled 2017-05-04 (×8): qty 1

## 2017-05-04 MED ORDER — GABAPENTIN 300 MG PO CAPS
300.0000 mg | ORAL_CAPSULE | Freq: Two times a day (BID) | ORAL | Status: DC
  Administered 2017-05-04 – 2017-05-08 (×8): 300 mg via ORAL
  Filled 2017-05-04 (×9): qty 1

## 2017-05-04 MED ORDER — ROCURONIUM BROMIDE 50 MG/5ML IV SOSY
PREFILLED_SYRINGE | INTRAVENOUS | Status: DC | PRN
  Administered 2017-05-04: 20 mg via INTRAVENOUS
  Administered 2017-05-04: 10 mg via INTRAVENOUS
  Administered 2017-05-04: 50 mg via INTRAVENOUS
  Administered 2017-05-04: 10 mg via INTRAVENOUS

## 2017-05-04 SURGICAL SUPPLY — 72 items
APPLIER CLIP 5 13 M/L LIGAMAX5 (MISCELLANEOUS)
APPLIER CLIP ROT 10 11.4 M/L (STAPLE)
BAG LAPAROSCOPIC 12 15 PORT 16 (BASKET) ×1 IMPLANT
BAG RETRIEVAL 12/15 (BASKET) ×2
BLADE EXTENDED COATED 6.5IN (ELECTRODE) IMPLANT
CABLE HIGH FREQUENCY MONO STRZ (ELECTRODE) ×2 IMPLANT
CELLS DAT CNTRL 66122 CELL SVR (MISCELLANEOUS) IMPLANT
CLIP APPLIE 5 13 M/L LIGAMAX5 (MISCELLANEOUS) IMPLANT
CLIP APPLIE ROT 10 11.4 M/L (STAPLE) IMPLANT
COUNTER NEEDLE 20 DBL MAG RED (NEEDLE) IMPLANT
COVER MAYO STAND STRL (DRAPES) IMPLANT
COVER SURGICAL LIGHT HANDLE (MISCELLANEOUS) ×2 IMPLANT
DECANTER SPIKE VIAL GLASS SM (MISCELLANEOUS) ×2 IMPLANT
DERMABOND ADVANCED (GAUZE/BANDAGES/DRESSINGS) ×1
DERMABOND ADVANCED .7 DNX12 (GAUZE/BANDAGES/DRESSINGS) ×1 IMPLANT
DISSECTOR BLUNT TIP ENDO 5MM (MISCELLANEOUS) IMPLANT
DRAIN CHANNEL 19F RND (DRAIN) IMPLANT
DRAPE LAPAROSCOPIC ABDOMINAL (DRAPES) IMPLANT
DRSG OPSITE POSTOP 4X10 (GAUZE/BANDAGES/DRESSINGS) IMPLANT
DRSG OPSITE POSTOP 4X6 (GAUZE/BANDAGES/DRESSINGS) IMPLANT
DRSG OPSITE POSTOP 4X8 (GAUZE/BANDAGES/DRESSINGS) IMPLANT
ELECT PENCIL ROCKER SW 15FT (MISCELLANEOUS) ×4 IMPLANT
ELECT REM PT RETURN 15FT ADLT (MISCELLANEOUS) ×2 IMPLANT
EVACUATOR SILICONE 100CC (DRAIN) IMPLANT
EXTRT SYSTEM ALEXIS 17CM (MISCELLANEOUS)
GAUZE SPONGE 4X4 12PLY STRL (GAUZE/BANDAGES/DRESSINGS) IMPLANT
GLOVE SURG SIGNA 7.5 PF LTX (GLOVE) ×6 IMPLANT
GOWN STRL REUS W/TWL XL LVL3 (GOWN DISPOSABLE) ×8 IMPLANT
HOLDER FOLEY CATH W/STRAP (MISCELLANEOUS) ×2 IMPLANT
LEGGING LITHOTOMY PAIR STRL (DRAPES) IMPLANT
LUBRICANT JELLY K Y 4OZ (MISCELLANEOUS) IMPLANT
NEEDLE HYPO 25X1 1.5 SAFETY (NEEDLE) ×2 IMPLANT
PACK COLON (CUSTOM PROCEDURE TRAY) ×2 IMPLANT
PAD POSITIONING PINK XL (MISCELLANEOUS) ×2 IMPLANT
PORT LAP GEL ALEXIS MED 5-9CM (MISCELLANEOUS) IMPLANT
POSITIONER SURGICAL ARM (MISCELLANEOUS) ×2 IMPLANT
POUCH RETRIEVAL ECOSAC 10 (ENDOMECHANICALS) ×1 IMPLANT
POUCH RETRIEVAL ECOSAC 10MM (ENDOMECHANICALS) ×1
RELOAD STAPLER GREEN 60MM (STAPLE) ×1 IMPLANT
RTRCTR WOUND ALEXIS 18CM MED (MISCELLANEOUS)
SEALER TISSUE X1 CVD JAW (INSTRUMENTS) IMPLANT
SET IRRIG TUBING LAPAROSCOPIC (IRRIGATION / IRRIGATOR) ×2 IMPLANT
SHEARS HARMONIC ACE PLUS 36CM (ENDOMECHANICALS) ×2 IMPLANT
SLEEVE ADV FIXATION 5X100MM (TROCAR) ×4 IMPLANT
STAPLER ECHELON LONG 60 440 (INSTRUMENTS) ×2 IMPLANT
STAPLER RELOAD GREEN 60MM (STAPLE) ×2
STAPLER VISISTAT 35W (STAPLE) ×2 IMPLANT
SUCTION YANKAUER HANDLE (MISCELLANEOUS) ×2 IMPLANT
SUT ETHILON 3 0 PS 1 (SUTURE) IMPLANT
SUT MNCRL AB 4-0 PS2 18 (SUTURE) ×2 IMPLANT
SUT NOVA NAB DX-16 0-1 5-0 T12 (SUTURE) IMPLANT
SUT PDS AB 1 CTX 36 (SUTURE) IMPLANT
SUT PDS AB 1 TP1 96 (SUTURE) IMPLANT
SUT PROLENE 2 0 KS (SUTURE) IMPLANT
SUT PROLENE 2 0 SH DA (SUTURE) IMPLANT
SUT SILK 2 0 (SUTURE) ×1
SUT SILK 2 0 SH CR/8 (SUTURE) ×2 IMPLANT
SUT SILK 2-0 18XBRD TIE 12 (SUTURE) ×1 IMPLANT
SUT SILK 3 0 (SUTURE) ×1
SUT SILK 3 0 SH CR/8 (SUTURE) ×2 IMPLANT
SUT SILK 3-0 18XBRD TIE 12 (SUTURE) ×1 IMPLANT
SUT VIC AB 2-0 SH 18 (SUTURE) ×2 IMPLANT
SUT VIC AB 3-0 SH 18 (SUTURE) ×2 IMPLANT
SYSTEM CONTND EXTRCTN KII BLLN (MISCELLANEOUS) IMPLANT
TOWEL OR NON WOVEN STRL DISP B (DISPOSABLE) IMPLANT
TRAY FOLEY W/METER SILVER 16FR (SET/KITS/TRAYS/PACK) IMPLANT
TROCAR ADV FIXATION 11X100MM (TROCAR) IMPLANT
TROCAR ADV FIXATION 12X100MM (TROCAR) IMPLANT
TROCAR ADV FIXATION 5X100MM (TROCAR) ×2 IMPLANT
TROCAR BLADELESS OPT 5 100 (ENDOMECHANICALS) ×2 IMPLANT
TROCAR XCEL BLUNT TIP 100MML (ENDOMECHANICALS) IMPLANT
TUBING INSUF HEATED (TUBING) ×2 IMPLANT

## 2017-05-04 NOTE — Anesthesia Procedure Notes (Signed)
Procedure Name: Intubation Date/Time: 05/04/2017 1:33 PM Performed by: Deliah Boston Pre-anesthesia Checklist: Patient identified, Emergency Drugs available, Suction available and Patient being monitored Patient Re-evaluated:Patient Re-evaluated prior to induction Oxygen Delivery Method: Circle system utilized Preoxygenation: Pre-oxygenation with 100% oxygen Induction Type: IV induction Ventilation: Mask ventilation without difficulty Laryngoscope Size: Mac and 4 Grade View: Grade I Tube type: Oral Tube size: 7.5 mm Number of attempts: 1 Airway Equipment and Method: Stylet and Oral airway Placement Confirmation: ETT inserted through vocal cords under direct vision,  positive ETCO2 and breath sounds checked- equal and bilateral Secured at: 22 cm Tube secured with: Tape Dental Injury: Teeth and Oropharynx as per pre-operative assessment

## 2017-05-04 NOTE — Interval H&P Note (Signed)
History and Physical Interval Note:  05/04/2017 1:16 PM  Austin [name]  has presented today for surgery, with the diagnosis of persistent diverticulis fistula  The various methods of treatment have been discussed with the patient and family.   Wife with patient.  He is marked for colostomy.  After consideration of risks, benefits and other options for treatment, the patient has consented to  Procedure(s): [place] (N/A) as a surgical intervention .  The patient's history has been reviewed, patient examined, no change in status, stable for surgery.  I have reviewed the patient's chart and labs.  Questions were answered to the patient's satisfaction.     Luberta Grabinski H

## 2017-05-04 NOTE — Transfer of Care (Signed)
Immediate Anesthesia Transfer of Care Note  Patient: Austin [name]  Procedure(s) Performed: Procedure(s): LAPAROSCOPIC SIGMOID COLON RESECTION WITH END [place] (N/A)  Patient Location: PACU  Anesthesia Type:General  Level of Consciousness: awake, alert  and oriented  Airway & Oxygen Therapy: Patient Spontanous Breathing and Patient connected to face mask  Post-op Assessment: Report given to RN and Post -op Vital signs reviewed and stable  Post vital signs: Reviewed and stable  Last Vitals:  Vitals:   05/04/17 1055  BP: (!) 142/67  Pulse: 81  Resp: 16  Temp: 36.5 C  SpO2: 100%    Last Pain:  Vitals:   05/04/17 1055  TempSrc: Oral         Complications: No apparent anesthesia complications

## 2017-05-04 NOTE — Anesthesia Postprocedure Evaluation (Signed)
Anesthesia Post Note  Patient: DARIN ARNDT  Procedure(s) Performed: Procedure(s) (LRB): LAPAROSCOPIC SIGMOID COLON RESECTION WITH END COLSOTOMY [place] (N/A)     Patient location during evaluation: PACU Anesthesia Type: General Level of consciousness: awake and alert Pain management: pain level controlled Vital Signs Assessment: post-procedure vital signs reviewed and stable Respiratory status: spontaneous breathing, nonlabored ventilation, respiratory function stable and patient connected to nasal cannula oxygen Cardiovascular status: blood pressure returned to baseline and stable Postop Assessment: no apparent nausea or vomiting Anesthetic complications: no    Last Vitals:  Vitals:   05/04/17 1800 05/04/17 1815  BP:  120/68  Pulse: 79 79  Resp: 14 10  Temp: (!) 36.3 C (!) 36.4 C  SpO2: 99% 97%    Last Pain:  Vitals:   05/04/17 1800  TempSrc:   PainSc: 0-No pain                 Tiajuana Amass

## 2017-05-04 NOTE — Op Note (Signed)
05/04/2017  4:23 PM  PATIENT:  Austin [name], 61 y.o., male, MRN: [id number]  PREOP DIAGNOSIS:  persistent diverticuliitis fistula of sigmoid colon  POSTOP DIAGNOSIS:   persistent diverticuliitis fistula of sigmoid colon, 8.4 cm metastatic renal cell carcinoma of the omentum  PROCEDURE:   Procedure(s): LAPAROSCOPIC SIGMOID COLON RESECTION WITH END COLSOTOMY, Hartmann's pouch, resection of 8.4 cm cancer of the omentum.  Enterolysis - 40 minutes.  SURGEON:   Alphonsa Overall, M.D.  ASSISTANT:   B. Hoxworth, M.D.   ANESTHESIA:   general  Anesthesiologist: Suzette Battiest, MD CRNA: Deliah Boston, CRNA; Williford, Peggy D, CRNA  General  EBL:  200  ml  BLOOD ADMINISTERED: none  DRAINS: none   LOCAL MEDICATIONS USED:   20 cc of Exparel and 30 cc of 1/4% marcaine  SPECIMEN:   Sigmoid colon (staples distal) and omental carcinoma  COUNTS CORRECT:  YES  INDICATIONS FOR PROCEDURE:  Austin [name] is a 61 y.o. (DOB: Apr 28, 1956) white male whose primary care physician is [name], MD and comes for sigmoid colon resection of persistent sigmoid colon fistula and end colostomy.   The indications and risks of the surgery were explained to the patient.  The risks include, but are not limited to, infection, bleeding, and nerve injury.  PROCEDURE:   The patient was taken to room #2 at [facility] operating room. He was given a general anesthesia. Both his arms were tucked to his side. He had an orogastric tube placed. He had a Foley catheter placed. The drain is left lower quadrant was removed. His abdomen was prepped with ChloraPrep and sterilely draped.   A timeout was held and surgical checklist run.    I accessed his abdominal cavity in the right upper quadrant with a 5 mm Ethicon Optiview trocar. I placed a 5 mm trocar in the right mid abdomen and a 5 mm trocar in the right lower abdomen. I placed a trocar in his future ostomy site in the left upper quadrant. An abdominal  exploration was carried out.   The right and left lobes liver were unremarkable. His stomach was unremarkable and there was no evidence for prior gastrostomy tube. He had a 8 cm mass consistent with his recurrent metastatic renal cell carcinoma in the middle of the abdomen stuck to the anterior peritoneal surface. The small bowel that I could see was unremarkable. The colon was unremarkable except for the sigmoid colon stuck to the left lower quadrant at the chronic fistula site.   First, I took down adhesions from his anterior abdominal wall and adhesions of the tumor to the anterior abdominal wall.  This took about 40 minutes.  There was small bowel stuck in a midline anterior abdominal wall hernia and I took down these adhesions. The hernia was about 7 cm in length and 3 cm in width. The tumor stuck to the anterior abdominal wall was vascular and bled during the dissection.   Once I created a space to work on the colon, I mobilized the sigmoid colon off the left lower quadrant peritoneum. There was a chronic fistula tract which was about 3 cm in size in the anterior abdominal wall at the fistula. The fistula was in the mid sigmoid colon. I decided to divide the distal sigmoid colon about 5 cm beyond the fistula with a 60 green Ethicon echelon stapler. I then divided the mesentery of the sigmoid colon up to the mid left colon using primarily the Harmonic scapel. This mobilized  an ample length of colon to do the colostomy. The patient had been marked preoperatively in the left upper quadrant for my target ostomy.   Since the midline tumor was in the way, I decided to resect this. I used the Harmonic scalpel to divide the omentum, which was feeding this tumor. After dividing the tumor from the transverse colon omentum, I placed it in a bag. I then created an ostomy site in the left upper quadrant at the prior marked location. I brought the bag out through the colostomy site and morcellated the tumor to get  the bag out of the abdominal cavity.  This was sent to pathology.   I then brought the divided sigmoid colon out through the ostomy site. I resected about 20 cm of sigmoid colon including the fistula. The staple line represent the distal colon. I tacked the colon to the anterior rectus fascia with interrupted 2-0 Vicryl sutures. I then matured the colostomy using interrupted 3-0 Vicryl sutures.   I re-laparoscoped the patient. I irrigated the peritoneal cavity with about 3 L of saline. Aspirated out the blood clots I saw. I thought the colon looked healthy going to the ostomy. There was no other bleeding areas.   I did an anterior abdominal wall block using a mixture of Exparel and Marcaine.   I removed the trochars. I sewed the trochars closed using 4-0 Monocryl suture. The colostomy bag was placed in the colostomy and Dermabond placed on the trocar sites.  Sponge and needle count were correct at the end the case.   The patient was transferred to recovery room in good condition with kept in stepdown overnight.  Alphonsa Overall, MD, [facility] Surgery Pager: [phone] Office phone:  [phone]

## 2017-05-04 NOTE — Anesthesia Preprocedure Evaluation (Addendum)
Anesthesia Evaluation  Patient identified by MRN, date of birth, ID band Patient awake    Reviewed: Allergy & Precautions, NPO status , Patient's Chart, lab work & pertinent test results  Airway Mallampati: II  TM Distance: >3 FB Neck ROM: Full    Dental  (+) Edentulous Upper, Edentulous Lower   Pulmonary former smoker,    breath sounds clear to auscultation       Cardiovascular hypertension, Pt. on medications  Rhythm:Regular Rate:Normal     Neuro/Psych  Neuromuscular disease CVA (brainstem stroke. qradriplegic), Residual Symptoms    GI/Hepatic negative GI ROS, Neg liver ROS, Colonic diverticular fistula    Endo/Other  diabetes  Renal/GU Renal disease     Musculoskeletal   Abdominal   Peds  Hematology  (+) anemia ,   Anesthesia Other Findings   Reproductive/Obstetrics                            Lab Results  Component Value Date   WBC 11.2 (H) 04/29/2017   HGB 12.3 (L) 04/29/2017   HCT 38.0 (L) 04/29/2017   MCV 82.8 04/29/2017   PLT 227 04/29/2017   Lab Results  Component Value Date   CREATININE 1.41 (H) 04/29/2017   BUN 34 (H) 04/29/2017   NA 133 (L) 04/29/2017   K 5.4 (H) 04/29/2017   CL 104 04/29/2017   CO2 20 (L) 04/29/2017    Anesthesia Physical Anesthesia Plan  ASA: IV  Anesthesia Plan: General   Post-op Pain Management:    Induction: Intravenous  PONV Risk Score and Plan: 4 or greater and Ondansetron, Dexamethasone, Scopolamine patch - Pre-op and Treatment may vary due to age or medical condition  Airway Management Planned: Oral ETT  Additional Equipment:   Intra-op Plan:   Post-operative Plan: Extubation in OR  Informed Consent: I have reviewed the patients History and Physical, chart, labs and discussed the procedure including the risks, benefits and alternatives for the proposed anesthesia with the patient or authorized representative who has indicated  his/her understanding and acceptance.   Dental advisory given  Plan Discussed with:   Anesthesia Plan Comments:        Anesthesia Quick Evaluation

## 2017-05-05 ENCOUNTER — Telehealth: Payer: Self-pay | Admitting: Family Medicine

## 2017-05-05 ENCOUNTER — Encounter (HOSPITAL_COMMUNITY): Payer: Self-pay | Admitting: Surgery

## 2017-05-05 LAB — CBC
HEMATOCRIT: 29.3 % — AB (ref 39.0–52.0)
Hemoglobin: 9.7 g/dL — ABNORMAL LOW (ref 13.0–17.0)
MCH: 27.7 pg (ref 26.0–34.0)
MCHC: 33.1 g/dL (ref 30.0–36.0)
MCV: 83.7 fL (ref 78.0–100.0)
Platelets: 241 10*3/uL (ref 150–400)
RBC: 3.5 MIL/uL — ABNORMAL LOW (ref 4.22–5.81)
RDW: 17.1 % — AB (ref 11.5–15.5)
WBC: 12.9 10*3/uL — AB (ref 4.0–10.5)

## 2017-05-05 LAB — BASIC METABOLIC PANEL
ANION GAP: 8 (ref 5–15)
BUN: 38 mg/dL — AB (ref 6–20)
CO2: 19 mmol/L — ABNORMAL LOW (ref 22–32)
Calcium: 8.1 mg/dL — ABNORMAL LOW (ref 8.9–10.3)
Chloride: 105 mmol/L (ref 101–111)
Creatinine, Ser: 1.59 mg/dL — ABNORMAL HIGH (ref 0.61–1.24)
GFR, EST AFRICAN AMERICAN: 52 mL/min — AB (ref >60)
GFR, EST NON AFRICAN AMERICAN: 45 mL/min — AB (ref >60)
Glucose, Bld: 188 mg/dL — ABNORMAL HIGH (ref 65–99)
POTASSIUM: 6 mmol/L — AB (ref 3.5–5.1)
SODIUM: 132 mmol/L — AB (ref 135–145)

## 2017-05-05 MED ORDER — DEXTROSE-NACL 5-0.45 % IV SOLN
INTRAVENOUS | Status: DC
  Administered 2017-05-05 – 2017-05-07 (×5): via INTRAVENOUS

## 2017-05-05 NOTE — Consult Note (Addendum)
[place] Nurse ostomy consult note Stoma type/location: LUQ colostomy Stomal assessment/size: 1 and 3/8 inches round, slightly budded, dark red Peristomal assessment: Intact with minor budge from 11-3 o'clock. Treatment options for stomal/peristomal skin: Flattened skin barrier ring Output: 280mls of brown liquid effluent emptied from old pouch Ostomy pouching: 2pc. 2 and 1/4 inch pouching system with skin barrier ring Education provided: Extended session with wife for pouch change today: GI A&P, pouch characteristics, stoma characteristics taught.  Demonstrated pouch removal, stoma measuring, pouch preparation, pouch application.  Lock and Occupational psychologist. Emptying technique demonstrated using a toilet paper wick. Patient and wife with many questions. Patient very involved in his care despite paraplegia. Understands purpose of ring, cloth backing on pouch.  Secure Start post acute support service described and wife signs permission card for the sharing of address and surgical information (ostomy type, size, etc).  We discuss that might like to accessorize the standard drainable pouching system with a closed end pouch on days when they are visiting MDs, etc. Enrolled patient in [place] program: Yes, today.  4 skin barriers, 4 pouches (opaque), 4 skin barrier rings, 6 closed end pouches, Adapt powder WOC nursing team will follow, and will remain available to this patient, the nursing, surgical and medical teams.   Thanks, Maudie Flakes, MSN, RN, [place], Arther Abbott  Pager# [phone]

## 2017-05-05 NOTE — Care Management Note (Signed)
Case Management Note  Patient Details  Name: Austin [name] MRN: [id number] Date of Birth: 07-13-1956  Subjective/Objective:                  PROCEDURE:   Procedure(s): LAPAROSCOPIC SIGMOID COLON RESECTION WITH END COLSOTOMY, Hartmann's pouch, resection of 8.4 cm cancer of the omentum.  Enterolysis  Action/Plan: May 05, 2017 Velva Harman, BSN, [place],  [place] Chart reviewed for case management and discharge needs. Next review date: [id number]  Expected Discharge Date:                  Expected Discharge Plan:  Home/Self Care  In-House Referral:     Discharge planning Services  CM Consult  Post Acute Care Choice:    Choice offered to:     DME Arranged:    DME Agency:     HH Arranged:    HH Agency:     Status of Service:  In process, will continue to follow  If discussed at Long Length of Stay Meetings, dates discussed:    Additional Comments:  Leeroy Cha, RN 05/05/2017, 9:01 AM

## 2017-05-05 NOTE — Consult Note (Addendum)
   [facility] Inpatient Consult   05/05/2017  Austin [name] 13-May-1956 [id number]     Patient screened for potential [facility] Care Management services.  Chart reviewed. Recently active with [place] Management program. Please see chart review tab then encounters for patient outreach details by [facility] RNCM.   Mr. Upchurch is currently in stepdown unit.   Will follow up at a more appropriate time to find out if Mr. Reinwald wants to re-engage with [place] Management program.   Marthenia Rolling, MSN-Ed, RN,BSN [facility] Liaison [phone]

## 2017-05-05 NOTE — Progress Notes (Signed)
[place] [facility] Office:  [phone] General Surgery Progress Note   LOS: 1 day  POD -  1 Day Post-Op  Chief Complaint: Chronic fistula  Assessment and Plan: 1.  LAPAROSCOPIC SIGMOID COLON RESECTION WITH END COLSOTOMY, resection of metastatic carcinoma (omental met), enterolysis  Looks good.  Will start clear liquids.  Move to the floor.  2.  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 Tresa Moore, peritoneal metastasis 2015 Followed by Dr. Jerilynn Mages 3. ANTICOAGULATED (Z79.01)             Impression: On plavix 4.  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 C. Diff - recurrent 6. Pituitary tumor in 1994 7.  DVT prophylaxis - Lovenox 8.  Hyperkalemia  K+ - 6.0 - 05/05/2017  To stop K+ 9.  Anemia  Hgb - 9.7 - 05/05/2017   Active Problems:   Colonic fistula   Subjective:  Doing well.  Minimal pain.  Not nauseated.  Objective:   Vitals:   05/05/17 0400 05/05/17 0600  BP: 121/71 140/61  Pulse: 76 79  Resp: (!) 8 11  Temp:    SpO2: 95% 96%     Intake/Output from previous day:  09/24 0701 - 09/25 0700 In: 4583.3 [I.V.:4083.3; IV Piggyback:500] Out: 945 [Urine:895; Blood:50]  Intake/Output this shift:  No intake/output data recorded.   Physical Exam:   General: WM WM who is alert and oriented. Difficult to understand.   HEENT: Normal. Pupils equal. .   Lungs: Clear.   Abdomen: Soft, has BS.   Wound: Ostomy in LUQ with stool   Lab Results:    Recent Labs  05/05/17 0325  WBC 12.9*  HGB 9.7*  HCT 29.3*  PLT 241    BMET   Recent Labs  05/05/17 0325  NA 132*  K 6.0*  CL 105  CO2 19*  GLUCOSE 188*  BUN 38*  CREATININE 1.59*  CALCIUM 8.1*    PT/INR  No results for input(s): LABPROT, INR in the last 72  hours.  ABG  No results for input(s): PHART, HCO3 in the last 72 hours.  Invalid input(s): PCO2, PO2   Studies/Results:  No results found.   Anti-infectives:   Anti-infectives    Start     Dose/Rate Route Frequency Ordered Stop   05/04/17 1251  cefoTEtan in Dextrose 5% (CEFOTAN) 2-2.08 GM-% IVPB    Comments:  Heide Scales   : cabinet override      05/04/17 1251 05/04/17 1335   05/04/17 1110  cefoTEtan in Dextrose 5% (CEFOTAN) IVPB 2 g     2 g Intravenous On call to O.R. 05/04/17 1110 05/04/17 1335      Alphonsa Overall, MD, FACS Pager: [place] [facility] Office: [phone] 05/05/2017

## 2017-05-05 NOTE — Progress Notes (Signed)
Pharmacy Brief Note - Alvimopan (Entereg)  The standing order set for alvimopan (Entereg) now includes an automatic order to discontinue the drug after the patient has had a bowel movement. The change was approved by the [place] and the Medical Executive Committee.   This patient has had bowel movements documented by nursing. Therefore, alvimopan has been discontinued. If there are questions, please contact the pharmacy at [phone].   Thank you-  Dia Sitter, PharmD, BCPS 05/05/2017 1:34 PM

## 2017-05-06 LAB — BASIC METABOLIC PANEL
Anion gap: 7 (ref 5–15)
Anion gap: 5 (ref 5–15)
BUN: 22 mg/dL — AB (ref 6–20)
BUN: 19 mg/dL (ref 6–20)
CALCIUM: 7.5 mg/dL — AB (ref 8.9–10.3)
CALCIUM: 7.7 mg/dL — AB (ref 8.9–10.3)
CHLORIDE: 105 mmol/L (ref 101–111)
CHLORIDE: 107 mmol/L (ref 101–111)
CO2: 18 mmol/L — AB (ref 22–32)
CO2: 19 mmol/L — ABNORMAL LOW (ref 22–32)
CREATININE: 1.19 mg/dL (ref 0.61–1.24)
CREATININE: 1.17 mg/dL (ref 0.61–1.24)
GFR calc Af Amer: >60 mL/min (ref >60)
GFR calc Af Amer: >60 mL/min (ref >60)
GFR calc non Af Amer: >60 mL/min (ref >60)
GFR calc non Af Amer: >60 mL/min (ref >60)
GLUCOSE: 421 mg/dL — AB (ref 65–99)
Glucose, Bld: 146 mg/dL — ABNORMAL HIGH (ref 65–99)
Potassium: 4 mmol/L (ref 3.5–5.1)
Potassium: 4.5 mmol/L (ref 3.5–5.1)
Sodium: 130 mmol/L — ABNORMAL LOW (ref 135–145)
Sodium: 131 mmol/L — ABNORMAL LOW (ref 135–145)

## 2017-05-06 LAB — CBC WITH DIFFERENTIAL/PLATELET
BASOS PCT: 0 %
Basophils Absolute: 0 10*3/uL (ref 0.0–0.1)
Basophils Absolute: 0.1 10*3/uL (ref 0.0–0.1)
Basophils Relative: 0 %
EOS ABS: 0.1 10*3/uL (ref 0.0–0.7)
EOS ABS: 0.2 10*3/uL (ref 0.0–0.7)
EOS PCT: 1 %
EOS PCT: 1 %
HCT: 27.8 % — ABNORMAL LOW (ref 39.0–52.0)
HEMATOCRIT: 27.1 % — AB (ref 39.0–52.0)
HEMOGLOBIN: 9 g/dL — AB (ref 13.0–17.0)
HEMOGLOBIN: 9.2 g/dL — AB (ref 13.0–17.0)
LYMPHS ABS: 1 10*3/uL (ref 0.7–4.0)
LYMPHS PCT: 5 %
Lymphocytes Relative: 6 %
Lymphs Abs: 0.8 10*3/uL (ref 0.7–4.0)
MCH: 27.4 pg (ref 26.0–34.0)
MCH: 27.5 pg (ref 26.0–34.0)
MCHC: 33.2 g/dL (ref 30.0–36.0)
MCHC: 33.1 g/dL (ref 30.0–36.0)
MCV: 82.4 fL (ref 78.0–100.0)
MCV: 83 fL (ref 78.0–100.0)
MONO ABS: 1.8 10*3/uL — AB (ref 0.1–1.0)
MONO ABS: 2.1 10*3/uL — AB (ref 0.1–1.0)
MONOS PCT: 14 %
Monocytes Relative: 12 %
NEUTROS PCT: 79 %
Neutro Abs: 12.2 10*3/uL — ABNORMAL HIGH (ref 1.7–7.7)
Neutro Abs: 11.9 10*3/uL — ABNORMAL HIGH (ref 1.7–7.7)
Neutrophils Relative %: 82 %
Platelets: 234 10*3/uL (ref 150–400)
Platelets: 236 10*3/uL (ref 150–400)
RBC: 3.29 MIL/uL — ABNORMAL LOW (ref 4.22–5.81)
RBC: 3.35 MIL/uL — ABNORMAL LOW (ref 4.22–5.81)
RDW: 17 % — AB (ref 11.5–15.5)
RDW: 17.2 % — AB (ref 11.5–15.5)
WBC: 14.9 10*3/uL — AB (ref 4.0–10.5)
WBC: 15.2 10*3/uL — ABNORMAL HIGH (ref 4.0–10.5)

## 2017-05-06 NOTE — Progress Notes (Signed)
[facility]:  [phone] General Surgery Progress Note   LOS: 2 days  POD -  2 Days Post-Op  Chief Complaint: Chronic fistula  Assessment and Plan: 1.  LAPAROSCOPIC SIGMOID COLON RESECTION WITH END COLSOTOMY, resection of metastatic carcinoma (omental met), enterolysis  Still in step down.  No room on the floor.  AM labs pending.  Will advance to full liquids.  2.  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 Tresa Moore, peritoneal metastasis 2015 Followed by Dr. Jerilynn Mages 3. ANTICOAGULATED (Z79.01)             Impression: On plavix 4.  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 C. Diff - recurrent 6. Pituitary tumor in 1994 7.  DVT prophylaxis - Lovenox 8.  Hyperkalemia  K+ - 6.0 - 05/05/2017  To stop K+ 9.  Anemia  Hgb - 9.7 - 05/05/2017   Active Problems:   Colonic fistula  Subjective:  Doing well.  Hungry.  Will advance diet. Objective:   Vitals:   05/06/17 0315 05/06/17 0600  BP:  115/68  Pulse:  79  Resp:  14  Temp: 98.7 F (37.1 C)   SpO2:       Intake/Output from previous day:  09/25 0701 - 09/26 0700 In: 2402.1 [I.V.:2402.1] Out: 2500 [Urine:2100; Stool:400]  Intake/Output this shift:  No intake/output data recorded.   Physical Exam:   General: WM WM who is alert and oriented. Difficult to understand.   HEENT: Normal. Pupils equal. .   Lungs: Clear.   Abdomen: Mildly distended, has BS.   Wound: Ostomy in LUQ with stool.   Lab Results:     Recent Labs  05/05/17 0325  WBC 12.9*  HGB 9.7*  HCT 29.3*  PLT 241    BMET    Recent Labs  05/05/17 0325  NA 132*  K 6.0*  CL 105  CO2 19*  GLUCOSE 188*  BUN 38*  CREATININE 1.59*  CALCIUM 8.1*    PT/INR  No results for input(s):  LABPROT, INR in the last 72 hours.  ABG  No results for input(s): PHART, HCO3 in the last 72 hours.  Invalid input(s): PCO2, PO2   Studies/Results:  No results found.   Anti-infectives:   Anti-infectives    Start     Dose/Rate Route Frequency Ordered Stop   05/04/17 1251  cefoTEtan in Dextrose 5% (CEFOTAN) 2-2.08 GM-% IVPB    Comments:  Heide Scales   : cabinet override      05/04/17 1251 05/04/17 1335   05/04/17 1110  cefoTEtan in Dextrose 5% (CEFOTAN) IVPB 2 g     2 g Intravenous On call to O.R. 05/04/17 1110 05/04/17 1335      Alphonsa Overall, MD, FACS Pager: [facility]: [phone] 05/06/2017

## 2017-05-06 NOTE — Consult Note (Signed)
[facility] Nurse ostomy consult note Stoma type/location: LUQ Colostomy Stomal assessment/size: see note from yesterday 9/25 Output: pouch had just been emptied by bedside staff, 200cc dark green effluent Ostomy pouching: 2 piece 2 1/4 inch pouch with skin barrier ring, no leaks currently. Education provided:  [employer] closure reviewed, gave pt a new pouch to practice this technique. Pouch had just been emptied, pt's wife commented "not the way Margarita Grizzle said"  Pt's wife states she has already rolled up some toilet paper wicks as Margarita Grizzle taught her yesterday.  She was also able to verbalize how to empty pouch, and clean with wicks, she is still practicing on the pouch closure technique. Patient hoping to move to regular bed on [address] [place] today so he will not be so tied up in wires and equipment.  Enrolled patient in [place] program: Yes, previously.  [place] nursing team will follow, and will remain available to this patient, the nursing, surgical and medical teams.

## 2017-05-06 NOTE — Telephone Encounter (Signed)
error 

## 2017-05-07 LAB — CBC WITH DIFFERENTIAL/PLATELET
BASOS PCT: 0 %
Basophils Absolute: 0 10*3/uL (ref 0.0–0.1)
Eosinophils Absolute: 0.2 10*3/uL (ref 0.0–0.7)
Eosinophils Relative: 1 %
HEMATOCRIT: 30 % — AB (ref 39.0–52.0)
Hemoglobin: 9.7 g/dL — ABNORMAL LOW (ref 13.0–17.0)
LYMPHS ABS: 0.9 10*3/uL (ref 0.7–4.0)
Lymphocytes Relative: 6 %
MCH: 26.7 pg (ref 26.0–34.0)
MCHC: 32.3 g/dL (ref 30.0–36.0)
MCV: 82.6 fL (ref 78.0–100.0)
MONO ABS: 2 10*3/uL — AB (ref 0.1–1.0)
MONOS PCT: 13 %
NEUTROS ABS: 12.1 10*3/uL — AB (ref 1.7–7.7)
NEUTROS PCT: 80 %
Platelets: 253 10*3/uL (ref 150–400)
RBC: 3.63 MIL/uL — ABNORMAL LOW (ref 4.22–5.81)
RDW: 17.2 % — ABNORMAL HIGH (ref 11.5–15.5)
WBC: 15.3 10*3/uL — ABNORMAL HIGH (ref 4.0–10.5)

## 2017-05-07 LAB — BASIC METABOLIC PANEL
ANION GAP: 8 (ref 5–15)
BUN: 17 mg/dL (ref 6–20)
CHLORIDE: 108 mmol/L (ref 101–111)
CO2: 18 mmol/L — AB (ref 22–32)
Calcium: 8.1 mg/dL — ABNORMAL LOW (ref 8.9–10.3)
Creatinine, Ser: 1.08 mg/dL (ref 0.61–1.24)
GFR calc Af Amer: >60 mL/min (ref >60)
GFR calc non Af Amer: >60 mL/min (ref >60)
GLUCOSE: 99 mg/dL (ref 65–99)
Potassium: 4.3 mmol/L (ref 3.5–5.1)
Sodium: 134 mmol/L — ABNORMAL LOW (ref 135–145)

## 2017-05-07 NOTE — Progress Notes (Signed)
PT Cancellation and Discharge Note  Patient Details Name: LOUAY MYRIE MRN: [id number] DOB: 1956/06/05   Cancelled Treatment:    Reason Eval/Treat Not Completed: PT screened, no needs identified, will sign off (spoke with pt and his wife, they feel he is at baseline with mobility. Pt's wife transferred him to power WC this morning and feels she can manage him at home. At baseline she assists with pivot to WC. No PT needs, will sign off. )   Philomena Doheny 05/07/2017, 10:53 AM [phone]

## 2017-05-07 NOTE — Progress Notes (Signed)
[facility]:  [phone] General Surgery Progress Note   LOS: 3 days  POD -  3 Days Post-Op  Chief Complaint: Chronic fistula  Assessment and Plan: 1.  LAPAROSCOPIC SIGMOID COLON RESECTION WITH END COLSOTOMY, resection of metastatic carcinoma (omental met), enterolysis  Path - colon - diverticulitis with perforation, metastatic clear cell ca  WBC - 15,200 - 05/06/2017  (I think that lab draws got messed up because of delay in transfer from ICU)  Now on the floor - took full liquids well   To advance to reg diet  2.  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 Tresa Moore, peritoneal metastasis 2015 Followed by Dr. Jerilynn Mages 3. ANTICOAGULATED (Z79.01)             Impression: On plavix 4.  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 C. Diff - recurrent 6. Pituitary tumor in 1994 7.  DVT prophylaxis - Lovenox 8.  Anemia  Hgb - 9.2 - 05/06/2017 9.  Foley  I have left this in because of quadriplegia and discussion with wife - will d/c tomorrow   Active Problems:   Colonic fistula  Subjective:  Doing well.  Says that he feels good Objective:   Vitals:   05/06/17 2143 05/07/17 0628  BP: 123/68 (!) 152/76  Pulse: 80 81  Resp: 15 14  Temp: 98 F (36.7 C) 98.5 F (36.9 C)  SpO2: 98% 98%     Intake/Output from previous day:  09/26 0701 - 09/27 0700 In: 1815.8 [P.O.:427; I.V.:1388.8] Out: 2650 [Urine:2450; Stool:200]  Intake/Output this shift:  No intake/output data recorded.   Physical Exam:   General: WM WM who is alert and oriented. Says that he feels good.   HEENT: Normal. Pupils equal. .   Lungs: Clear.   Abdomen: Mildly distended, has BS.   Wound: Ostomy in LUQ with large amount of stool.   Lab Results:      Recent Labs  05/06/17 1127 05/06/17 2108  WBC 14.9* 15.2*  HGB 9.0* 9.2*  HCT 27.1* 27.8*  PLT 234 236    BMET    Recent Labs  05/06/17 1127 05/06/17 2108  NA 130* 131*  K 4.0 4.5  CL 105 107  CO2 18* 19*  GLUCOSE 421* 146*  BUN 22* 19  CREATININE 1.19 1.17  CALCIUM 7.5* 7.7*    PT/INR  No results for input(s): LABPROT, INR in the last 72 hours.  ABG  No results for input(s): PHART, HCO3 in the last 72 hours.  Invalid input(s): PCO2, PO2   Studies/Results:  No results found.   Anti-infectives:   Anti-infectives    Start     Dose/Rate Route Frequency Ordered Stop   05/04/17 1251  cefoTEtan in Dextrose 5% (CEFOTAN) 2-2.08 GM-% IVPB    Comments:  Heide Scales   : cabinet override      05/04/17 1251 05/04/17 1335   05/04/17 1110  cefoTEtan in Dextrose 5% (CEFOTAN) IVPB 2 g     2 g Intravenous On call to O.R. 05/04/17 1110 05/04/17 1335      Alphonsa Overall, MD, FACS Pager: [facility]  Office: [phone] 05/07/2017

## 2017-05-08 NOTE — Discharge Summary (Signed)
Physician Discharge Summary  Patient ID:  KEARNEY EVITT  MRN: [id number]  DOB/AGE: 62-Oct-1957 62 y.o.  Admit date: 05/04/2017 Discharge date: 05/08/2017  Discharge Diagnoses:  1.  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 Tresa Moore, peritoneal metastasis 2015 Followed by Dr. Jerilynn Mages 3. ANTICOAGULATED (Z79.01) Impression: On plavix - this was held during hospitalization 4. 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 Pituitary tumor in 1994 6.  Anemia             Hgb - 9.7 - 05/07/2017    Active Problems:   Colonic fistula   Operation: Procedure(s): LAPAROSCOPIC SIGMOID COLON RESECTION WITH END COLSOTOMY, resection of metastatic carcinoma (omental met), enterolysis on 05/04/2017 - D. Lucia Gaskins  Discharged Condition: good  Hospital Course: Austin [name] is an 61 y.o. male whose primary care physician is [name], MD and who was admitted 05/04/2017 with a chief complaint of persistent diverticular sigmoid colon enterocutaneous fistula. He was brought to the operating room on 05/04/2017 and underwent LAPAROSCOPIC SIGMOID COLON RESECTION WITH END COLSOTOMY, resection of metastatic carcinoma (omental met), enterolysis.  Because of his disability, he was kept in the ICU for 2 days. He was advanced to a regular diet. He is on regular food and tolerating this.  His colostomy is working. Because of his disability, we kept his foley in during the hospitalization.  His pathology showed diverticulitis with perforation and metastatic clear cell ca consistent with metastatic renal cell carcinoma.  He is ready to go home.  His wife is well versed in his care.  The discharge  instructions were reviewed with the patient.  Consults: None  Significant Diagnostic Studies: Results for orders placed or performed during the hospital encounter of 05/04/17  MRSA PCR Screening  Result Value Ref Range   MRSA by PCR NEGATIVE NEGATIVE  Glucose, capillary  Result Value Ref Range   Glucose-Capillary 86 65 - 99 mg/dL  Basic metabolic panel  Result Value Ref Range   Sodium 132 (L) 135 - 145 mmol/L   Potassium 6.0 (H) 3.5 - 5.1 mmol/L   Chloride 105 101 - 111 mmol/L   CO2 19 (L) 22 - 32 mmol/L   Glucose, Bld 188 (H) 65 - 99 mg/dL   BUN 38 (H) 6 - 20 mg/dL   Creatinine, Ser 1.59 (H) 0.61 - 1.24 mg/dL   Calcium 8.1 (L) 8.9 - 10.3 mg/dL   GFR calc non Af Amer 45 (L) >60 mL/min   GFR calc Af Amer 52 (L) >60 mL/min   Anion gap 8 5 - 15  CBC  Result Value Ref Range   WBC 12.9 (H) 4.0 - 10.5 K/uL   RBC 3.50 (L) 4.22 - 5.81 MIL/uL   Hemoglobin 9.7 (L) 13.0 - 17.0 g/dL   HCT 29.3 (L) 39.0 - 52.0 %   MCV 83.7 78.0 - 100.0 fL   MCH 27.7 26.0 - 34.0 pg   MCHC 33.1 30.0 - 36.0 g/dL   RDW 17.1 (H) 11.5 - 15.5 %   Platelets 241 150 - 400 K/uL  CBC with Differential/Platelet  Result Value Ref Range   WBC 14.9 (H) 4.0 - 10.5 K/uL   RBC 3.29 (L) 4.22 - 5.81 MIL/uL   Hemoglobin 9.0 (L) 13.0 - 17.0 g/dL   HCT 27.1 (L)  39.0 - 52.0 %   MCV 82.4 78.0 - 100.0 fL   MCH 27.4 26.0 - 34.0 pg   MCHC 33.2 30.0 - 36.0 g/dL   RDW 17.0 (H) 11.5 - 15.5 %   Platelets 234 150 - 400 K/uL   Neutrophils Relative % 82 %   Neutro Abs 12.2 (H) 1.7 - 7.7 K/uL   Lymphocytes Relative 5 %   Lymphs Abs 0.8 0.7 - 4.0 K/uL   Monocytes Relative 12 %   Monocytes Absolute 1.8 (H) 0.1 - 1.0 K/uL   Eosinophils Relative 1 %   Eosinophils Absolute 0.1 0.0 - 0.7 K/uL   Basophils Relative 0 %   Basophils Absolute 0.0 0.0 - 0.1 K/uL  Basic metabolic panel  Result Value Ref Range   Sodium 130 (L) 135 - 145 mmol/L   Potassium 4.0 3.5 - 5.1 mmol/L   Chloride 105 101 - 111 mmol/L   CO2 18 (L) 22 - 32  mmol/L   Glucose, Bld 421 (H) 65 - 99 mg/dL   BUN 22 (H) 6 - 20 mg/dL   Creatinine, Ser 1.19 0.61 - 1.24 mg/dL   Calcium 7.5 (L) 8.9 - 10.3 mg/dL   GFR calc non Af Amer >60 >60 mL/min   GFR calc Af Amer >60 >60 mL/min   Anion gap 7 5 - 15  Basic metabolic panel  Result Value Ref Range   Sodium 134 (L) 135 - 145 mmol/L   Potassium 4.3 3.5 - 5.1 mmol/L   Chloride 108 101 - 111 mmol/L   CO2 18 (L) 22 - 32 mmol/L   Glucose, Bld 99 65 - 99 mg/dL   BUN 17 6 - 20 mg/dL   Creatinine, Ser 1.08 0.61 - 1.24 mg/dL   Calcium 8.1 (L) 8.9 - 10.3 mg/dL   GFR calc non Af Amer >60 >60 mL/min   GFR calc Af Amer >60 >60 mL/min   Anion gap 8 5 - 15  CBC with Differential/Platelet  Result Value Ref Range   WBC 15.3 (H) 4.0 - 10.5 K/uL   RBC 3.63 (L) 4.22 - 5.81 MIL/uL   Hemoglobin 9.7 (L) 13.0 - 17.0 g/dL   HCT 30.0 (L) 39.0 - 52.0 %   MCV 82.6 78.0 - 100.0 fL   MCH 26.7 26.0 - 34.0 pg   MCHC 32.3 30.0 - 36.0 g/dL   RDW 17.2 (H) 11.5 - 15.5 %   Platelets 253 150 - 400 K/uL   Neutrophils Relative % 80 %   Neutro Abs 12.1 (H) 1.7 - 7.7 K/uL   Lymphocytes Relative 6 %   Lymphs Abs 0.9 0.7 - 4.0 K/uL   Monocytes Relative 13 %   Monocytes Absolute 2.0 (H) 0.1 - 1.0 K/uL   Eosinophils Relative 1 %   Eosinophils Absolute 0.2 0.0 - 0.7 K/uL   Basophils Relative 0 %   Basophils Absolute 0.0 0.0 - 0.1 K/uL  CBC with Differential/Platelet  Result Value Ref Range   WBC 15.2 (H) 4.0 - 10.5 K/uL   RBC 3.35 (L) 4.22 - 5.81 MIL/uL   Hemoglobin 9.2 (L) 13.0 - 17.0 g/dL   HCT 27.8 (L) 39.0 - 52.0 %   MCV 83.0 78.0 - 100.0 fL   MCH 27.5 26.0 - 34.0 pg   MCHC 33.1 30.0 - 36.0 g/dL   RDW 17.2 (H) 11.5 - 15.5 %   Platelets 236 150 - 400 K/uL   Neutrophils Relative % 79 %   Neutro Abs 11.9 (H) 1.7 -  7.7 K/uL   Lymphocytes Relative 6 %   Lymphs Abs 1.0 0.7 - 4.0 K/uL   Monocytes Relative 14 %   Monocytes Absolute 2.1 (H) 0.1 - 1.0 K/uL   Eosinophils Relative 1 %   Eosinophils Absolute 0.2 0.0 - 0.7  K/uL   Basophils Relative 0 %   Basophils Absolute 0.1 0.0 - 0.1 K/uL  Basic metabolic panel  Result Value Ref Range   Sodium 131 (L) 135 - 145 mmol/L   Potassium 4.5 3.5 - 5.1 mmol/L   Chloride 107 101 - 111 mmol/L   CO2 19 (L) 22 - 32 mmol/L   Glucose, Bld 146 (H) 65 - 99 mg/dL   BUN 19 6 - 20 mg/dL   Creatinine, Ser 1.17 0.61 - 1.24 mg/dL   Calcium 7.7 (L) 8.9 - 10.3 mg/dL   GFR calc non Af Amer >60 >60 mL/min   GFR calc Af Amer >60 >60 mL/min   Anion gap 5 5 - 15    Dg Sinus/fist Tube Chk-non Gi  Result Date: 04/14/2017 INDICATION: 61 year old male with a history of left lower quadrant abscess, drained with pigtail drainage catheter 01/26/2017. The patient had developed a fistula after drainage, which has been confirmed by fluoroscopic guided drain injection. The fistula has persisted until the most recent study 03/17/2017. EXAM: FLUOROSCOPIC DRAIN INJECTION MEDICATIONS: None ANESTHESIA/SEDATION: None COMPLICATIONS: None PROCEDURE: Informed written consent was obtained from the patient after a thorough discussion of the procedural risks, benefits and alternatives. All questions were addressed. A timeout was performed prior to the initiation of the procedure. Patient positioned supine position on the fluoroscopic table. After scout images of the left lower quadrant/pelvis were performed, the drain was injected under fluoroscopic observation. Persistent fistula to the colon was confirmed. No large abscess cavity. Gravity drain was reapplied. IMPRESSION: Fluoroscopic drain injection confirms a persisting small fistula to the colon. Signed, Dulcy Fanny. Earleen Newport, DO [facility] Electronically Signed   By: Corrie Mckusick D.O.   On: 04/14/2017 15:35    Discharge Exam:  Vitals:   05/07/17 2109 05/08/17 0530  BP: (!) 164/83 (!) 134/51  Pulse: 93 88  Resp:  15  Temp: 99.5 F (37.5 C) 98.9 F (37.2 C)  SpO2: 91% 92%    General: WN WM  who is alert.  He has the disabilities of his prior stroke. Lungs: Clear to auscultation and symmetric breath sounds.  But poor inspiration. Heart:  RRR. No murmur or rub. Abdomen:  LUQ ostomy looks good.  His incisions look good.  Discharge Medications:   Allergies as of 05/08/2017      Reactions   Penicillins Rash   Has patient had a PCN reaction causing immediate rash, facial/tongue/throat swelling, SOB or lightheadedness with hypotension: No Has patient had a PCN reaction causing severe rash involving mucus membranes or skin necrosis: No Has patient had a PCN reaction that required hospitalization No Has patient had a PCN reaction occurring within the last 10 years: Yes  If all of the above answers are "NO", then may proceed with Cephalosporin use.      Medication List    TAKE these medications   albuterol 108 (90 Base) MCG/ACT inhaler Commonly known as:  PROVENTIL HFA;VENTOLIN HFA Inhale 2 puffs into the lungs every 6 (six) hours as needed for wheezing or shortness of breath.   atorvastatin 20 MG tablet Commonly known as:  LIPITOR Take 20 mg by mouth daily.   ciprofloxacin 500 MG tablet  Commonly known as:  CIPRO Take 1 tablet (500 mg total) by mouth 2 (two) times daily.   clopidogrel 75 MG tablet Commonly known as:  PLAVIX TAKE 1 TABLET BY MOUTH AT BEDTIME.   escitalopram 10 MG tablet Commonly known as:  LEXAPRO TAKE ONE TABLET BY MOUTH ONCE DAILY IN THE MORNING   ketoconazole 2 % cream Commonly known as:  NIZORAL APPLY AS NEEDED TO RASH What changed:  See the new instructions.   lisinopril-hydrochlorothiazide 20-12.5 MG tablet Commonly known as:  PRINZIDE,ZESTORETIC Take 1 tablet by mouth daily.   multivitamin with minerals Tabs tablet Take 1 tablet by mouth daily.   saccharomyces boulardii 250 MG capsule Commonly known as:  FLORASTOR Take 1 capsule (250 mg total) by mouth 2 (two) times daily.   testosterone enanthate 200 MG/ML injection Commonly known as:   DELATESTRYL Inject 200 mg into the muscle every 14 (fourteen) days. For IM use only   traMADol 50 MG tablet Commonly known as:  ULTRAM Take 50 mg by mouth every 6 (six) hours as needed for moderate pain.   traZODone 100 MG tablet Commonly known as:  DESYREL TAKE ONE TABLET BY MOUTH AT BEDTIME   triamcinolone cream 0.1 % Commonly known as:  KENALOG Apply 1 application topically 2 (two) times daily as needed (for rash).   vancomycin 50 mg/mL oral solution Commonly known as:  VANCOCIN Take 190m BID for 7 days Take 1263mDaily for 7 days Take 12510mvery 2 days for 8 weeks            Discharge Care Instructions        Start     Ordered   05/08/17 0000  Diet - low sodium heart healthy     05/08/17 0828   05/08/17 0000  Increase activity slowly     05/08/17 [id number]   Disposition: 01-Home or Self Care  Discharge Instructions    Diet - low sodium heart healthy    Complete by:  As directed    Increase activity slowly    Complete by:  As directed        Signed: DavAlphonsa Overall.D., FACOdessa Endoscopy Center LLCrgery Office:  [phone]/28/2018, 8:30 AM

## 2017-05-08 NOTE — Discharge Instructions (Signed)
[facility] - DISCHARGE INSTRUCTIONS TO PATIENT  Wound Care:   Ostomy care  Diet:  As tolerated  Follow up appointment:  Call Dr. Pollie Friar office [facility]) at [phone] for an appointment in 2 to 4 weeks.  Medications and dosages:  Resume your home medications.        May restart Plavix  Call Dr. Lucia Gaskins or his office  [phone]) if you have:  Temperature greater than 100.4,  Persistent nausea and vomiting,  Severe uncontrolled pain,  Redness, tenderness, or signs of infection (pain, swelling, redness, odor or green/yellow discharge around the site),  Difficulty breathing, headache or visual disturbances,  Any other questions or concerns you may have after discharge.  In an emergency, call 911 or go to an Emergency Department at a nearby hospital.

## 2017-05-08 NOTE — Consult Note (Signed)
[place] Nurse ostomy follow up Stoma type/location: LUQ colostomy.  Mucosa sloughing today.   Stomal assessment/size: 1 and 3/8 inch with  Stomal mucosa sloughing.  Peristomal assessment: intact Treatment options for stomal/peristomal skin: skin barrier ring Output: pasty brown stool  Ostomy pouching: 2pc. 2 and 1/4 inch pouching system with skin barrier ring Education provided: Extended session with patient and wife: wife performing pouch emptying, pouch removal, pouch preparation and application. Cueing needed, suggestions provided.  1-page teaching sheet is very helpful to her. [email or web address] is demonstrated for simple trouble-shooting.  Supplies provided, my contact information is provided as well. Taught that stoma is remodeling and that sloughing material will end up in pouch mixed with stool. Stoma assessment by Dr. Lucia Gaskins next week. Enrolled patient in [place] [facility] Discharge program: Yes. Kit received and contents discussed with patient and wife. [place] nursing team will follow, and will remain available to this patient, the nursing, surgical and medical teams.  Please re-consult if needed. Thanks, Maudie Flakes, MSN, RN, [place], Ar[facility]  Pager# [phone]

## 2017-05-13 ENCOUNTER — Ambulatory Visit
Admission: RE | Admit: 2017-05-13 | Discharge: 2017-05-13 | Disposition: A | Discharge status: Home / Self Care | Payer: PPO | Source: Ambulatory Visit | Attending: Surgery | Admitting: Surgery

## 2017-05-13 ENCOUNTER — Other Ambulatory Visit: Payer: Self-pay | Admitting: Surgery

## 2017-05-13 DIAGNOSIS — R059 Cough, unspecified: Secondary | ICD-10-CM

## 2017-05-13 DIAGNOSIS — R05 Cough: Secondary | ICD-10-CM

## 2017-05-15 ENCOUNTER — Ambulatory Visit (INDEPENDENT_AMBULATORY_CARE_PROVIDER_SITE_OTHER): Payer: PPO | Admitting: Family Medicine

## 2017-05-15 ENCOUNTER — Encounter: Payer: Self-pay | Admitting: Family Medicine

## 2017-05-15 VITALS — BP 120/70 | HR 76 | Temp 99.1°F

## 2017-05-15 DIAGNOSIS — R05 Cough: Secondary | ICD-10-CM | POA: Diagnosis not present

## 2017-05-15 DIAGNOSIS — R509 Fever, unspecified: Secondary | ICD-10-CM | POA: Diagnosis not present

## 2017-05-15 DIAGNOSIS — R059 Cough, unspecified: Secondary | ICD-10-CM

## 2017-05-15 LAB — POCT URINALYSIS DIPSTICK
Bilirubin, UA: NEGATIVE
Glucose, UA: NEGATIVE
Ketones, UA: NEGATIVE
Nitrite, UA: POSITIVE
Protein, UA: NEGATIVE
Spec Grav, UA: 1.015 (ref 1.010–1.025)
Urobilinogen, UA: 0.2 E.U./dL
pH, UA: 5 (ref 5.0–8.0)

## 2017-05-15 MED ORDER — LEVOFLOXACIN 500 MG PO TABS: 500.0000 mg | ORAL_TABLET | Freq: Every day | ORAL | 0 refills | Status: DC

## 2017-05-15 NOTE — Patient Instructions (Signed)
Follow up for any persistent fever, shortness of breath, or vomiting

## 2017-05-15 NOTE — Progress Notes (Signed)
Subjective:     Patient ID: Austin [name], male   DOB: July 13, 1956, 61 y.o.   MRN: [id number]  HPI Patient seen with chief complaint of fever. He has multiple chronic problems including remote history of brainstem stroke, history of pituitary tumor, quadriplegia from his stroke, metastatic breast carcinoma, type 2 diabetes, hypogonadism, neurogenic bladder, hyperlipidemia, hypertension  History of diverticulitis and had persistent sigmoid colon intracutaneous fistula repair. He was admitted 9/24 through 9/28. He had sigmoid colon resection with and colostomy and resection also of metastatic carcinoma from omental tissue. Surgery was without complications.  Comes in now with 2 day history of fever. He had reported fever up 101.5 on Wednesday. Had some cough with mostly clear sputum. Surgeon ordered chest x-ray one view which showed no pneumonia or other acute abnormalities. He has long history of recurrent UTI and neurogenic bladder. Wife has been giving Tylenol fairly much around-the-clock since 2 days ago. No vomiting. No abdominal pain. He does have history of C. difficile in the past and finished prolonged course of vancomycin back in August  Denies any sore throat. No headaches. No confusion.  Past Medical History:  Diagnosis Date  . Cerebrovascular accident ([place]) 1994   hx of brainstem stroke, residual diminished lung capacity  . Chronic kidney disease    Left Renal Mass  . Diabetes mellitus without complication (HCC)    no medications;   . Hx of colonic polyps   . Hypertension   . Hypogonadism   . 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 Neuromuscular disorder ([place])    quadraplegic  . Open abdominal incision with drainage 01/26/2017  . Pituitary adenoma ([place])   . 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 Pneumonia   . 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[place])   . Urinary incontinence   . Venous stasis    edema   Past Surgical  History:  Procedure Laterality Date  . CHOLECYSTECTOMY    . COLON RESECTION N/A 05/04/2017   Procedure: LAPAROSCOPIC SIGMOID COLON RESECTION WITH END COLSOTOMY ERAS PATHWAY;  Surgeon: Alphonsa Overall, MD;  Location: [facility];  Service: General;  Laterality: N/A;  . COLONOSCOPY  02/27/2012   Procedure: COLONOSCOPY;  Surgeon: Jerene Bears, MD;  [facility]: [facility];  Service: Gastroenterology;  Laterality: N/A;  . gamma knife radiation surgery  05/2010   for pituitary  . PITUITARY SURGERY  2007   nose approach  . ROBOT ASSISTED LAPAROSCOPIC NEPHRECTOMY  04/23/2012   Procedure: ROBOTIC ASSISTED LAPAROSCOPIC NEPHRECTOMY;  Surgeon: Alexis Frock, MD;  Location: [facility];  Service: Urology;  Laterality: Left;  radical  . Robotic Partial Nephrectomy  04-23-12   left   Left Renal Mass  . stomach peg  02/1993   removed 5-6 years later  . TRACHEOSTOMY TUBE PLACEMENT  02/1993   removed 04/1993  . UMBILICAL HERNIA REPAIR  04/23/2012   Procedure: HERNIA REPAIR UMBILICAL ADULT;  Surgeon: Alexis Frock, MD;  Location: [facility];  Service: Urology;;  . [name]  . vocal cord surgery     injected with collagen, then fat from stomach to improve speech s/p stroke    reports that he quit smoking about 24 years ago. His smoking use included Cigarettes. He has a 11.50 pack-year smoking history. He has never used smokeless tobacco. He reports that he does not drink alcohol or use drugs. family history includes Alcohol abuse in his father; Arthritis in his maternal grandmother and  mother; Colon cancer in his brother; Esophageal cancer (age of onset: 44) in his father; Hyperlipidemia in his mother; Hypertension in his brother; Stroke in his paternal grandmother; Throat cancer in his father; Uterine cancer (age of onset: 92) in his mother. Allergies  Allergen Reactions  . Penicillins Rash    Has patient had a PCN reaction causing immediate rash, facial/tongue/throat swelling, SOB or lightheadedness with  hypotension: No Has patient had a PCN reaction causing severe rash involving mucus membranes or skin necrosis: No Has patient had a PCN reaction that required hospitalization No Has patient had a PCN reaction occurring within the last 10 years: Yes  If all of the above answers are "NO", then may proceed with Cephalosporin use.     Review of Systems  Constitutional: Positive for chills, fatigue and fever.  HENT: Negative for congestion and sore throat.   Respiratory: Positive for cough. Negative for shortness of breath and wheezing.   Gastrointestinal: Negative for abdominal pain.  Neurological: Negative for headaches.  Psychiatric/Behavioral: Negative for confusion.       Objective:   Physical Exam  Constitutional: He appears well-developed and well-nourished.  Cardiovascular: Normal rate and regular rhythm.   Pulmonary/Chest: Effort normal and breath sounds normal. He has no wheezes. He has no rales.  He has some scattered rhonchi but no rales  Abdominal: Soft. He exhibits no mass. There is no tenderness. There is no rebound and no guarding.  Musculoskeletal: He exhibits no edema.  Skin: No rash noted.       Assessment:     61 year old male with multiple chronic problems who presents with fever. Does have some cough though relatively nonfocal exam with chest x-ray couple days ago showing no pneumonia. Urine dipstick reveals small blood, trace leukocytes and positive nitrites. High risk for colonization. Patient is nonseptic in appearance    Plan:     -Check CBC with differential -Urine culture sent -Start Levaquin 500 milligrams once daily for 7 days pending culture results -Follow-up immediately for any vomiting, confusion, or other concerns  [name] MD [facility]

## 2017-05-16 LAB — CBC WITH DIFFERENTIAL/PLATELET
BASOS ABS: 119 {cells}/uL (ref 0–200)
Basophils Relative: 0.8 %
EOS PCT: 3 %
Eosinophils Absolute: 447 cells/uL (ref 15–500)
HEMATOCRIT: 29.3 % — AB (ref 38.5–50.0)
HEMOGLOBIN: 9.6 g/dL — AB (ref 13.2–17.1)
LYMPHS ABS: 1252 {cells}/uL (ref 850–3900)
MCH: 26.9 pg — ABNORMAL LOW (ref 27.0–33.0)
MCHC: 32.8 g/dL (ref 32.0–36.0)
MCV: 82.1 fL (ref 80.0–100.0)
MPV: 9.2 fL (ref 7.5–12.5)
Monocytes Relative: 11 %
NEUTROS ABS: 11443 {cells}/uL — AB (ref 1500–7800)
NEUTROS PCT: 76.8 %
Platelets: 443 10*3/uL — ABNORMAL HIGH (ref 140–400)
RBC: 3.57 10*6/uL — AB (ref 4.20–5.80)
RDW: 15.6 % — AB (ref 11.0–15.0)
Total Lymphocyte: 8.4 %
WBC: 14.9 10*3/uL — ABNORMAL HIGH (ref 3.8–10.8)
WBCMIX: 1639 {cells}/uL — AB (ref 200–950)

## 2017-05-18 LAB — URINE CULTURE
MICRO NUMBER: 81110535
SPECIMEN QUALITY: ADEQUATE

## 2017-05-25 ENCOUNTER — Encounter: Payer: Self-pay | Admitting: Diagnostic Radiology

## 2017-05-27 ENCOUNTER — Other Ambulatory Visit: Payer: Self-pay | Admitting: Family Medicine

## 2017-05-28 ENCOUNTER — Emergency Department (HOSPITAL_COMMUNITY): Payer: PPO

## 2017-05-28 ENCOUNTER — Encounter (HOSPITAL_COMMUNITY): Payer: Self-pay | Admitting: Emergency Medicine

## 2017-05-28 ENCOUNTER — Inpatient Hospital Stay (HOSPITAL_COMMUNITY)
Admission: EM | Admit: 2017-05-28 | Discharge: 2017-06-02 | DRG: 377 | Disposition: A | Discharge status: Home / Self Care | Payer: PPO | Attending: Internal Medicine | Admitting: Internal Medicine

## 2017-05-28 DIAGNOSIS — Z880 Allergy status to penicillin: Secondary | ICD-10-CM | POA: Diagnosis not present

## 2017-05-28 DIAGNOSIS — K5720 Diverticulitis of large intestine with perforation and abscess without bleeding: Secondary | ICD-10-CM | POA: Diagnosis present

## 2017-05-28 DIAGNOSIS — Z933 Colostomy status: Secondary | ICD-10-CM | POA: Diagnosis not present

## 2017-05-28 DIAGNOSIS — Z905 Acquired absence of kidney: Secondary | ICD-10-CM | POA: Diagnosis not present

## 2017-05-28 DIAGNOSIS — Z860100 Personal history of colon polyps, unspecified: Secondary | ICD-10-CM

## 2017-05-28 DIAGNOSIS — E1165 Type 2 diabetes mellitus with hyperglycemia: Secondary | ICD-10-CM | POA: Diagnosis present

## 2017-05-28 DIAGNOSIS — Z85528 Personal history of other malignant neoplasm of kidney: Secondary | ICD-10-CM

## 2017-05-28 DIAGNOSIS — E119 Type 2 diabetes mellitus without complications: Secondary | ICD-10-CM | POA: Diagnosis not present

## 2017-05-28 DIAGNOSIS — C642 Malignant neoplasm of left kidney, except renal pelvis: Secondary | ICD-10-CM | POA: Diagnosis present

## 2017-05-28 DIAGNOSIS — D62 Acute posthemorrhagic anemia: Secondary | ICD-10-CM | POA: Diagnosis present

## 2017-05-28 DIAGNOSIS — I471 Supraventricular tachycardia: Secondary | ICD-10-CM | POA: Diagnosis not present

## 2017-05-28 DIAGNOSIS — R109 Unspecified abdominal pain: Secondary | ICD-10-CM | POA: Diagnosis not present

## 2017-05-28 DIAGNOSIS — N179 Acute kidney failure, unspecified: Secondary | ICD-10-CM | POA: Diagnosis present

## 2017-05-28 DIAGNOSIS — Z79899 Other long term (current) drug therapy: Secondary | ICD-10-CM | POA: Diagnosis not present

## 2017-05-28 DIAGNOSIS — R933 Abnormal findings on diagnostic imaging of other parts of digestive tract: Secondary | ICD-10-CM | POA: Diagnosis not present

## 2017-05-28 DIAGNOSIS — Z993 Dependence on wheelchair: Secondary | ICD-10-CM

## 2017-05-28 DIAGNOSIS — E785 Hyperlipidemia, unspecified: Secondary | ICD-10-CM | POA: Diagnosis present

## 2017-05-28 DIAGNOSIS — K922 Gastrointestinal hemorrhage, unspecified: Secondary | ICD-10-CM | POA: Diagnosis present

## 2017-05-28 DIAGNOSIS — Z7901 Long term (current) use of anticoagulants: Secondary | ICD-10-CM

## 2017-05-28 DIAGNOSIS — E872 Acidosis: Secondary | ICD-10-CM | POA: Diagnosis present

## 2017-05-28 DIAGNOSIS — Z9049 Acquired absence of other specified parts of digestive tract: Secondary | ICD-10-CM | POA: Diagnosis not present

## 2017-05-28 DIAGNOSIS — Z7902 Long term (current) use of antithrombotics/antiplatelets: Secondary | ICD-10-CM | POA: Diagnosis not present

## 2017-05-28 DIAGNOSIS — R1084 Generalized abdominal pain: Secondary | ICD-10-CM | POA: Diagnosis not present

## 2017-05-28 DIAGNOSIS — I69920 Aphasia following unspecified cerebrovascular disease: Secondary | ICD-10-CM

## 2017-05-28 DIAGNOSIS — G8250 Quadriplegia, unspecified: Secondary | ICD-10-CM | POA: Diagnosis present

## 2017-05-28 DIAGNOSIS — Z87891 Personal history of nicotine dependence: Secondary | ICD-10-CM | POA: Diagnosis not present

## 2017-05-28 DIAGNOSIS — D500 Iron deficiency anemia secondary to blood loss (chronic): Secondary | ICD-10-CM | POA: Diagnosis present

## 2017-05-28 DIAGNOSIS — I10 Essential (primary) hypertension: Secondary | ICD-10-CM | POA: Diagnosis present

## 2017-05-28 DIAGNOSIS — C649 Malignant neoplasm of unspecified kidney, except renal pelvis: Secondary | ICD-10-CM | POA: Diagnosis present

## 2017-05-28 DIAGNOSIS — K5730 Diverticulosis of large intestine without perforation or abscess without bleeding: Secondary | ICD-10-CM | POA: Diagnosis not present

## 2017-05-28 DIAGNOSIS — I700 Atherosclerosis of aorta: Secondary | ICD-10-CM | POA: Diagnosis present

## 2017-05-28 DIAGNOSIS — E875 Hyperkalemia: Secondary | ICD-10-CM | POA: Diagnosis present

## 2017-05-28 DIAGNOSIS — N319 Neuromuscular dysfunction of bladder, unspecified: Secondary | ICD-10-CM | POA: Diagnosis present

## 2017-05-28 DIAGNOSIS — K2980 Duodenitis without bleeding: Secondary | ICD-10-CM | POA: Diagnosis not present

## 2017-05-28 DIAGNOSIS — K264 Chronic or unspecified duodenal ulcer with hemorrhage: Secondary | ICD-10-CM | POA: Diagnosis present

## 2017-05-28 DIAGNOSIS — K269 Duodenal ulcer, unspecified as acute or chronic, without hemorrhage or perforation: Secondary | ICD-10-CM

## 2017-05-28 DIAGNOSIS — C786 Secondary malignant neoplasm of retroperitoneum and peritoneum: Secondary | ICD-10-CM | POA: Diagnosis present

## 2017-05-28 DIAGNOSIS — K2950 Unspecified chronic gastritis without bleeding: Secondary | ICD-10-CM | POA: Diagnosis not present

## 2017-05-28 DIAGNOSIS — K2970 Gastritis, unspecified, without bleeding: Secondary | ICD-10-CM | POA: Diagnosis not present

## 2017-05-28 HISTORY — DX: Gastrointestinal hemorrhage, unspecified: K92.2

## 2017-05-28 HISTORY — DX: Enterocolitis due to Clostridium difficile, not specified as recurrent: A04.72

## 2017-05-28 HISTORY — DX: Cerebral infarction, unspecified: I63.9

## 2017-05-28 HISTORY — DX: Peritoneal abscess: K65.1

## 2017-05-28 LAB — COMPREHENSIVE METABOLIC PANEL
ALBUMIN: 2.9 g/dL — AB (ref 3.5–5.0)
ALK PHOS: 70 U/L (ref 38–126)
ALT: 31 U/L (ref 17–63)
AST: 16 U/L (ref 15–41)
Anion gap: 10 (ref 5–15)
BUN: 59 mg/dL — ABNORMAL HIGH (ref 6–20)
CHLORIDE: 109 mmol/L (ref 101–111)
CO2: 20 mmol/L — AB (ref 22–32)
Calcium: 9 mg/dL (ref 8.9–10.3)
Creatinine, Ser: 1.62 mg/dL — ABNORMAL HIGH (ref 0.61–1.24)
GFR calc Af Amer: 51 mL/min — ABNORMAL LOW (ref >60)
GFR calc non Af Amer: 44 mL/min — ABNORMAL LOW (ref >60)
GLUCOSE: 147 mg/dL — AB (ref 65–99)
POTASSIUM: 5.4 mmol/L — AB (ref 3.5–5.1)
SODIUM: 139 mmol/L (ref 135–145)
Total Bilirubin: 0.5 mg/dL (ref 0.3–1.2)
Total Protein: 7.1 g/dL (ref 6.5–8.1)

## 2017-05-28 LAB — IRON AND TIBC
IRON: 57 ug/dL (ref 45–182)
Saturation Ratios: 23 % (ref 17.9–39.5)
TIBC: 249 ug/dL — AB (ref 250–450)
UIBC: 192 ug/dL

## 2017-05-28 LAB — RETICULOCYTES
RBC.: 2.79 MIL/uL — ABNORMAL LOW (ref 4.22–5.81)
Retic Count, Absolute: 92.1 10*3/uL (ref 19.0–186.0)
Retic Ct Pct: 3.3 % — ABNORMAL HIGH (ref 0.4–3.1)

## 2017-05-28 LAB — HEMOGLOBIN AND HEMATOCRIT, BLOOD
HCT: 23.6 % — ABNORMAL LOW (ref 39.0–52.0)
Hemoglobin: 7.5 g/dL — ABNORMAL LOW (ref 13.0–17.0)

## 2017-05-28 LAB — CBC
HEMATOCRIT: 28.2 % — AB (ref 39.0–52.0)
HEMOGLOBIN: 8.9 g/dL — AB (ref 13.0–17.0)
MCH: 26.6 pg (ref 26.0–34.0)
MCHC: 31.6 g/dL (ref 30.0–36.0)
MCV: 84.2 fL (ref 78.0–100.0)
PLATELETS: 391 10*3/uL (ref 150–400)
RBC: 3.35 MIL/uL — ABNORMAL LOW (ref 4.22–5.81)
RDW: 17.2 % — ABNORMAL HIGH (ref 11.5–15.5)
WBC: 14.1 10*3/uL — ABNORMAL HIGH (ref 4.0–10.5)

## 2017-05-28 LAB — FERRITIN: FERRITIN: 25 ng/mL (ref 24–336)

## 2017-05-28 LAB — FOLATE: Folate: 25 ng/mL (ref >5.9)

## 2017-05-28 LAB — VITAMIN B12: Vitamin B-12: 531 pg/mL (ref 180–914)

## 2017-05-28 MED ORDER — SODIUM CHLORIDE 0.9 % IV SOLN
INTRAVENOUS | Status: AC
  Administered 2017-05-28: 17:00:00 via INTRAVENOUS

## 2017-05-28 MED ORDER — ONDANSETRON HCL 4 MG/2ML IJ SOLN
4.0000 mg | Freq: Four times a day (QID) | INTRAMUSCULAR | Status: DC | PRN
  Administered 2017-05-30 – 2017-05-31 (×2): 4 mg via INTRAVENOUS
  Filled 2017-05-28 (×2): qty 2

## 2017-05-28 MED ORDER — IOPAMIDOL (ISOVUE-300) INJECTION 61%
30.0000 mL | Freq: Once | INTRAVENOUS | Status: AC | PRN
  Administered 2017-05-28: 30 mL via ORAL

## 2017-05-28 MED ORDER — ONDANSETRON HCL 4 MG PO TABS: 4.0000 mg | ORAL_TABLET | Freq: Four times a day (QID) | ORAL | Status: DC | PRN

## 2017-05-28 MED ORDER — ALBUTEROL SULFATE HFA 108 (90 BASE) MCG/ACT IN AERS: 2.0000 | INHALATION_SPRAY | Freq: Four times a day (QID) | RESPIRATORY_TRACT | Status: DC | PRN

## 2017-05-28 MED ORDER — SACCHAROMYCES BOULARDII 250 MG PO CAPS
250.0000 mg | ORAL_CAPSULE | Freq: Two times a day (BID) | ORAL | Status: DC
  Administered 2017-05-28 – 2017-06-02 (×9): 250 mg via ORAL
  Filled 2017-05-28 (×9): qty 1

## 2017-05-28 MED ORDER — ALBUTEROL SULFATE (2.5 MG/3ML) 0.083% IN NEBU: 2.5000 mg | INHALATION_SOLUTION | Freq: Four times a day (QID) | RESPIRATORY_TRACT | Status: DC | PRN

## 2017-05-28 MED ORDER — ATORVASTATIN CALCIUM 20 MG PO TABS
20.0000 mg | ORAL_TABLET | Freq: Every day | ORAL | Status: DC
  Administered 2017-05-29 – 2017-06-02 (×4): 20 mg via ORAL
  Filled 2017-05-28 (×4): qty 1

## 2017-05-28 MED ORDER — TRAZODONE HCL 50 MG PO TABS
100.0000 mg | ORAL_TABLET | Freq: Every day | ORAL | Status: DC
  Administered 2017-05-28 – 2017-06-01 (×5): 100 mg via ORAL
  Filled 2017-05-28: qty 2
  Filled 2017-05-28: qty 1
  Filled 2017-05-28 (×3): qty 2

## 2017-05-28 MED ORDER — IOPAMIDOL (ISOVUE-300) INJECTION 61%
INTRAVENOUS | Status: AC
Start: 2017-05-28 — End: 2017-05-28
  Administered 2017-05-28: 30 mL via ORAL
  Filled 2017-05-28: qty 30

## 2017-05-28 NOTE — H&P (Signed)
History and Physical    Austin [name] FKC:[id number] DOB: Dec 18, 1955 DOA: 05/28/2017  PCP: [name], MD  Patient coming from:  home  Chief Complaint:   Blood in ostomy bag  HPI: Austin [name] is a 61 y.o. male with medical history significant of quadraplegia since age 61 from brainstem stroke, renal cell carcinoma s/p nephrectomy and no other treatment since 7494,  Recent complications from acute diverticulitis s/p colostomy 9/18, polypectomy comes in with one day of maroon, bright red blood in his ostomy bag.  He vomited once this am which was bilious and nonbloody.  Pt is minimally verbal and communicates mostly via his wife who takes very good care of him at home.  At baseline he can bear weight and help her pivot himself from bed to wheelchair and can use his right arm some.  He has feeling throughout.  There has been no fevers.  No cough.  He says he has minimal abdominal pain which is generalized.  He had a colonoscopy in 2013 and had many polyps removed, he has had no follow up scope due to his other medical issues.  His wife reports that she has emptied less than 1/2 of his bag since last night.  There has been minimal output since arrival to ED.  Pt referred for admission for bleeding from GI tract.  He is on plavix.  Review of Systems: As per HPI otherwise 10 point review of systems negative per wife  Past Medical History:  Diagnosis Date  . Cerebrovascular accident ([place]) 1994   hx of brainstem stroke, residual diminished lung capacity  . Chronic kidney disease    Left Renal Mass  . Diabetes mellitus without complication (HCC)    no medications;   . Hx of colonic polyps   . Hypertension   . Hypogonadism   . 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 Neuromuscular disorder ([place])    quadraplegic  . Open abdominal incision with drainage 01/26/2017  . Pituitary adenoma ([place])   . Pituitary macroadenoma ([place])    progression into  right cavernous sinus  . Pneumonia   . 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[place])   . Urinary incontinence   . Venous stasis    edema    Past Surgical History:  Procedure Laterality Date  . CHOLECYSTECTOMY    . COLON RESECTION N/A 05/04/2017   Procedure: LAPAROSCOPIC SIGMOID COLON RESECTION WITH END COLSOTOMY ERAS PATHWAY;  Surgeon: Alphonsa Overall, MD;  [facility];  Service: General;  Laterality: N/A;  . COLONOSCOPY  02/27/2012   Procedure: COLONOSCOPY;  Surgeon: Jerene Bears, MD;  [facility];  Service: Gastroenterology;  Laterality: N/A;  . gamma knife radiation surgery  05/2010   for pituitary  . IR RADIOLOGIST EVAL & MGMT  02/12/2017  . IR RADIOLOGIST EVAL & MGMT  03/17/2017  . IR RADIOLOGIST EVAL & MGMT  04/14/2017  . PITUITARY SURGERY  2007   nose approach  . ROBOT ASSISTED LAPAROSCOPIC NEPHRECTOMY  04/23/2012   Procedure: ROBOTIC ASSISTED LAPAROSCOPIC NEPHRECTOMY;  Surgeon: Alexis Frock, MD;  [facility];  Service: Urology;  Laterality: Left;  radical  . Robotic Partial Nephrectomy  04-23-12   left   Left Renal Mass  . stomach peg  02/1993   removed 5-6 years later  . TRACHEOSTOMY TUBE PLACEMENT  02/1993   removed 04/1993  . UMBILICAL HERNIA REPAIR  04/23/2012   Procedure: HERNIA REPAIR UMBILICAL ADULT;  Surgeon: Alexis Frock,  MD;  Location: [facility];  Service: Urology;;  . [place]  . vocal cord surgery     injected with collagen, then fat from stomach to improve speech s/p stroke     reports that he quit smoking about 24 years ago. His smoking use included Cigarettes. He has a 11.50 pack-year smoking history. He has never used smokeless tobacco. He reports that he does not drink alcohol or use drugs.  Allergies  Allergen Reactions  . Penicillins Rash    Has patient had a PCN reaction causing immediate rash, facial/tongue/throat swelling, SOB or lightheadedness with hypotension: No Has patient had a PCN reaction causing severe rash involving mucus membranes or skin  necrosis: No Has patient had a PCN reaction that required hospitalization No Has patient had a PCN reaction occurring within the last 10 years: Yes  If all of the above answers are "NO", then may proceed with Cephalosporin use.    Family History  Problem Relation Age of Onset  . Alcohol abuse Father   . Throat cancer Father   . Esophageal cancer Father 74  . Arthritis Mother   . Hyperlipidemia Mother   . Uterine cancer Mother 60  . Colon cancer Brother   . Arthritis Maternal Grandmother   . Hypertension Brother   . Stroke Paternal Grandmother     Prior to Admission medications   Medication Sig Start Date End Date Taking? Authorizing Provider  acetaminophen (TYLENOL) 500 MG tablet Take 1,500 mg by mouth every 6 (six) hours as needed for mild pain, moderate pain, fever or headache.   Yes [provider]  albuterol (PROVENTIL HFA;VENTOLIN HFA) 108 (90 BASE) MCG/ACT inhaler Inhale 2 puffs into the lungs every 6 (six) hours as needed for wheezing or shortness of breath. 07/07/15  Yes Debbrah Alar, NP  atorvastatin (LIPITOR) 20 MG tablet Take 20 mg by mouth daily with breakfast.    Yes [provider]  cetirizine (ZYRTEC) 10 MG tablet Take 10 mg by mouth daily with breakfast.   Yes [provider]  clopidogrel (PLAVIX) 75 MG tablet TAKE 1 TABLET BY MOUTH AT BEDTIME. 04/27/17  Yes [name], [name], MD  escitalopram (LEXAPRO) 10 MG tablet TAKE ONE TABLET BY MOUTH ONCE DAILY IN THE MORNING 03/16/17  Yes [name], [name], MD  ketoconazole (NIZORAL) 2 % cream APPLY AS NEEDED TO RASH Patient taking differently: Apply to groin or legs daily as needed for rash 10/23/16  Yes [name], [name], MD  lisinopril-hydrochlorothiazide (PRINZIDE,ZESTORETIC) 20-12.5 MG tablet TAKE ONE TABLET BY MOUTH TWICE DAILY Patient taking differently: TAKE ONE TABLET BY MOUTH once every day 05/27/17  Yes [name], [name], MD  Melatonin 3 MG TABS Take 3 mg by mouth at bedtime as  needed (for sleep).   Yes [provider]  Multiple Vitamin (MULTIVITAMIN WITH MINERALS) TABS tablet Take 1 tablet by mouth daily with breakfast.    Yes [provider]  saccharomyces boulardii (FLORASTOR) 250 MG capsule Take 1 capsule (250 mg total) by mouth 2 (two) times daily. 01/30/17  Yes Aline August, MD  testosterone enanthate (DELATESTRYL) 200 MG/ML injection Inject 200 mg into the muscle every 14 (fourteen) days. For IM use only   Yes [provider]  traMADol (ULTRAM) 50 MG tablet Take 50 mg by mouth every 6 (six) hours as needed for moderate pain.   Yes [provider]  traZODone (DESYREL) 100 MG tablet TAKE ONE TABLET BY MOUTH AT BEDTIME 03/16/17  Yes [name], [name],  MD  triamcinolone cream (KENALOG) 0.1 % Apply 1 application topically 2 (two) times daily as needed (for rash). Applies to face   Yes [provider]  levofloxacin (LEVAQUIN) 500 MG tablet Take 1 tablet (500 mg total) by mouth daily. Patient not taking: Reported on 05/28/2017 05/15/17   [name], MD    Physical Exam: Vitals:   05/28/17 1025 05/28/17 1030 05/28/17 1137 05/28/17 1238  BP: 109/83 121/84 123/74 105/69  Pulse: (!) 107 (!) 101 (!) 102 (!) 101  Resp: '18  18 18  ' Temp:   98.8 F (37.1 C)   TempSrc:   Oral   SpO2: 100% 99% 98% 98%      Constitutional: NAD, calm, comfortable Vitals:   05/28/17 1025 05/28/17 1030 05/28/17 1137 05/28/17 1238  BP: 109/83 121/84 123/74 105/69  Pulse: (!) 107 (!) 101 (!) 102 (!) 101  Resp: '18  18 18  ' Temp:   98.8 F (37.1 C)   TempSrc:   Oral   SpO2: 100% 99% 98% 98%   Eyes: PERRL, lids and conjunctivae normal ENMT: Mucous membranes are moist. Posterior pharynx clear of any exudate or lesions.Normal dentition.  Neck: normal, supple, no masses, no thyromegaly Respiratory: clear to auscultation bilaterally, no wheezing, no crackles. Normal respiratory effort. No accessory muscle use.  Cardiovascular: Regular rate  and rhythm, no murmurs / rubs / gallops. No extremity edema. 2+ pedal pulses. No carotid bruits.  Abdomen: no tenderness, no masses palpated. No hepatosplenomegaly. Bowel sounds positive.  Musculoskeletal: no clubbing / cyanosis. No joint deformity upper and lower extremities. Good ROM, no contractures. Normal muscle tone.  Skin: no rashes, lesions, ulcers. No induration Neurologic: CN 2-12 grossly intact. Sensation intact, DTR not normal. Quadriplegic Psychiatric: Normal judgment and insight. Alert and oriented x 3. Normal mood.    Labs on Admission: I have personally reviewed following labs and imaging studies  CBC:  Recent Labs Lab 05/28/17 0759  WBC 14.1*  HGB 8.9*  HCT 28.2*  MCV 84.2  PLT 595   Basic Metabolic Panel:  Recent Labs Lab 05/28/17 0759  NA 139  K 5.4*  CL 109  CO2 20*  GLUCOSE 147*  BUN 59*  CREATININE 1.62*  CALCIUM 9.0   GFR: CrCl cannot be calculated (Unknown ideal weight.). Liver Function Tests:  Recent Labs Lab 05/28/17 0759  AST 16  ALT 31  ALKPHOS 70  BILITOT 0.5  PROT 7.1  ALBUMIN 2.9*    Radiological Exams on Admission: Ct Abdomen Pelvis Wo Contrast  Result Date: 05/28/2017 CLINICAL DATA:  Abdominal pain. EXAM: CT ABDOMEN AND PELVIS WITHOUT CONTRAST TECHNIQUE: Multidetector CT imaging of the abdomen and pelvis was performed following the standard protocol without IV contrast. COMPARISON:  02/05/2017 FINDINGS: Lower chest: Stable left lower lobe lung nodule measuring 1.1 cm, image 20 of series 3. No pleural effusion. No acute abnormality. Hepatobiliary: No focal liver abnormalities. Previous cholecystectomy. No biliary dilatation. Pancreas: Unremarkable. No pancreatic ductal dilatation or surrounding inflammatory changes. Spleen: Normal in size without focal abnormality. Adrenals/Urinary Tract: Similar appearance of bilateral adrenal nodules including 1.4 cm left adrenal gland nodule, image 29 of series 2. Hyperdense lesion arising from  the posterior cortex of the inferior pole of right kidney is unchanged measuring 0.9 cm. No right-sided hydronephrosis. Surgical changes of previous left nephrectomy with region of fat necrosis in the nephrectomy bed. This is similar to previous exam. The urinary bladder appears unremarkable. Stomach/Bowel: Stomach is normal. The small bowel loops have a normal caliber without  obstruction. New left lower quadrant colostomy. There is soft tissue stranding within the left lower quadrant of the abdomen at the colostomy site as well as a few small foci of extraluminal gas, image 48 of series 2. Vascular/Lymphatic: Aortic atherosclerosis. No aneurysm. No upper abdominal or pelvic adenopathy. Reproductive: Prostate is unremarkable. Other: Interval removal of left lower quadrant percutaneous drainage catheter. Multifocal peritoneal metastasis consistent with known metastatic renal cell carcinoma. The index lesion within the ventral abdomen is longer visualized and has presumably been resected. Right lower quadrant metastatic deposit measures 6.8 cm, image 54 of series 2. Previously 5.5 cm. Nodule along the undersurface of the right lobe of liver measures 2.1 cm, image 26 of series 2. Previously 1.4 cm. Musculoskeletal: No aggressive lytic or sclerotic bone lesions. IMPRESSION: 1. Since the previous exam of 02/05/2017 the patient has undergone removal of left lower quadrant percutaneous drainage catheter and placement of a left lower quadrant colostomy. The bowel loops have a normal caliber. There is no evidence for obstruction. 2. There is soft tissue stranding and a few small foci of gas associated with the segment of bowel entering the colostomy site. Findings may reflect recent postoperative change. Inflammation/infection not excluded. No abscess identified however. 3. Metastatic renal cell carcinoma is again noted. The dominant mass within the central abdomen has presumably been resected. Lesion within the right lower  quadrant of the abdomen and lesion along the undersurface of the right lobe of liver demonstrates mild increase in size in the interval. 4.  Aortic Atherosclerosis (ICD10-I70.0). Electronically Signed   By: Kerby Moors M.D.   On: 05/28/2017 11:45    Old chart reviewed Case discussed with edp   Assessment/Plan 61 yo male with multiple medical problems with recent colostomy comes in with bright red blood in his new ostomy bag  Principal Problem:   GIB (gastrointestinal bleeding)- hold plavix and aspirin products.  Consult GI for possible scoping.  hgb 8.9 from baseline of 9.6 3 weeks ago.  Afvss.  Not much output since arrival here.  Stable.  Keep npo until seen by GI.  May be a diverticular bleed but has h/o several polyp removed in 2013, also had post scope acute bleed from polyp removal at that time.  Repeat h/h later today.  Chronically on tramadol  Active Problems:   Anemia associated with acute blood loss- ck anemia panel.  Transfuse if drops below 8 or becomes unstable   Essential hypertension- stable   Neurogenic bladder- noted   COLONIC POLYPS, HX OF- noted   Renal carcinoma metastatic with carcinomatosis- noted, not getting treatment since 2015   Type 2 diabetes mellitus, controlled ([place])- ssi   Perforation of sigmoid colon due to diverticulitis s/p colostomy 9/18- noted   Quadriplegia from brainstem stroke- noted   S/p nephrectomy- noted   AKI - cr up to 1.6.  Gentle ivf and repeat in am, hold ace inhibitor and tramadol    DVT prophylaxis:  scds  Code Status:  full Family Communication:  wife Disposition Plan:  Per day team Consults called:  GI Admission status:  observation    Amberlynn Tempesta A MD [facility]  If 7PM-7AM, please contact night-coverage [email or web address] Password [email or web address]  05/28/2017, 12:56 PM

## 2017-05-28 NOTE — ED Notes (Signed)
MD Lucia Gaskins examined pt's colostomy, collected a sample to send to pathology. New colostomy bag placed.

## 2017-05-28 NOTE — Progress Notes (Signed)
[facility]:  [phone] General Surgery Progress Note   LOS: 0 days  POD -     Chief Complaint: Bleeding at colostomy  Assessment and Plan: 1.  Bleeding from colon  Questionable slough of colon versus colonic polyp (photo under PE)  Wife at bedside.  To hold anticoagulants, IV fluids, clear liquid diet, follow labs, follow H&H, hold plavix (and all anticoagulation)  2. Marland Kitchen  LAPAROSCOPIC SIGMOID COLON RESECTION WITH END COLSOTOMY, resection of metastatic carcinoma (omental met), enterolysis             Path - colon - diverticulitis with perforation, metastatic clear cell ca  3. 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 Tresa Moore, peritoneal metastasis 2015 Followed by Dr. Jerilynn Mages 4. ANTICOAGULATED (Z79.01) Impression: On plavix 5. 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 C. Diff - recurrent 7. Pituitary tumor in 1994 8.  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 Neddo was discharged on 05/08/2017 after having a sigmoid colon resection and end colostomy on 05/04/2017.   According to his wife, he had something hanging out of his ostomy at discharge, but I was not aware of this.  Over the last 24 hours -  They have noticed black stool.  He has had no abdominal symptoms, he is eating, and he came to the  [facility] ER for evaluation.  His last colonoscopy was about 5 years ago by Dr. Hilarie Fredrickson.  According to the wife, they removed about 12 colonic polyps.  CT scant today - 1) There is soft tissue stranding and a few small foci of gas associated with the segment of bowel entering the colostomy site. 2) Metastatic renal cell carcinoma is again noted.  Objective:   Vitals:   05/28/17 1137 05/28/17 1238  BP: 123/74 105/69  Pulse: (!) 102 (!) 101  Resp: 18 18  Temp: 98.8 F (37.1 C)   SpO2: 98% 98%     Intake/Output from previous day:  No intake/output data recorded.  Intake/Output this shift:  No intake/output data recorded.   Physical Exam:   General: WN WM who is alert and oriented.   Speech pattern hard to follow because of stroke.   HEENT: Normal. Pupils equal. .   Lungs: Clear   Abdomen: Soft Ostomy - has black old blood in colostomy bag.  Has 1 x 6 cm "tissue" hanging from ostomy.  I amputated this at the bed side and sent it to the pathologist.   Tissue hanging out of ostomy   Ostomy after being cleaned up - mucosa viable.   Lab Results:    Recent Labs  05/28/17 0759  WBC 14.1*  HGB 8.9*  HCT 28.2*  PLT 391    BMET   Recent Labs  05/28/17 0759  NA 139  K 5.4*  CL 109  CO2 20*  GLUCOSE 147*  BUN 59*  CREATININE 1.62*  CALCIUM 9.0    PT/INR  No results for input(s): LABPROT, INR in the last 72 hours.  ABG  No results for input(s): PHART, HCO3 in the last 72 hours.  Invalid input(s): PCO2, PO2   Studies/Results:  Ct Abdomen Pelvis Wo Contrast  Result Date: 05/28/2017 CLINICAL DATA:  Abdominal pain. EXAM: CT ABDOMEN AND PELVIS WITHOUT CONTRAST TECHNIQUE: Multidetector CT imaging of the abdomen and pelvis was performed following the standard protocol without IV contrast. COMPARISON:  02/05/2017 FINDINGS: Lower chest: Stable left lower lobe lung nodule measuring 1.1 cm, image 20 of series 3. No pleural effusion. No acute abnormality. Hepatobiliary: No focal  liver abnormalities. Previous cholecystectomy. No biliary dilatation. Pancreas: Unremarkable. No pancreatic ductal dilatation or surrounding inflammatory changes. Spleen: Normal in size without focal abnormality. Adrenals/Urinary Tract: Similar appearance of bilateral adrenal nodules including 1.4 cm left adrenal gland nodule, image 29 of series 2. Hyperdense lesion arising from the posterior cortex of the inferior pole of right kidney is unchanged measuring 0.9 cm. No right-sided hydronephrosis. Surgical changes of previous left nephrectomy with region of fat necrosis in the nephrectomy bed. This is similar to previous exam. The urinary bladder appears unremarkable. Stomach/Bowel: Stomach is normal. The small bowel loops have a normal caliber without obstruction. New left lower quadrant colostomy. There is soft tissue stranding within the left lower quadrant of the abdomen at the colostomy site as well as a few small foci of extraluminal gas, image 48 of series 2. Vascular/Lymphatic: Aortic atherosclerosis. No aneurysm. No upper abdominal or pelvic adenopathy. Reproductive: Prostate is unremarkable. Other: Interval removal of left lower quadrant percutaneous drainage catheter. Multifocal peritoneal metastasis consistent with known metastatic renal cell carcinoma. The index lesion within the ventral abdomen is longer visualized and has presumably been resected. Right lower quadrant metastatic deposit measures 6.8 cm, image 54 of series 2. Previously 5.5 cm. Nodule along the undersurface of the right lobe of liver measures 2.1 cm, image 26 of series 2. Previously 1.4 cm. Musculoskeletal: No aggressive lytic or sclerotic bone lesions. IMPRESSION: 1. Since the previous exam of 02/05/2017 the patient has undergone removal of left lower quadrant percutaneous drainage catheter and placement of a left lower quadrant colostomy. The bowel loops have a normal caliber. There is no evidence for obstruction. 2. There is soft  tissue stranding and a few small foci of gas associated with the segment of bowel entering the colostomy site. Findings may reflect recent postoperative change. Inflammation/infection not excluded. No abscess identified however. 3. Metastatic renal cell carcinoma is again noted. The dominant mass within the central abdomen has presumably been resected. Lesion within the right lower quadrant of the abdomen and lesion along the undersurface of the right lobe of liver demonstrates mild increase in size in the interval. 4.  Aortic Atherosclerosis (ICD10-I70.0). Electronically Signed   By: Kerby Moors M.D.   On: 05/28/2017 11:45     Anti-infectives:   Anti-infectives    None      Alphonsa Overall, MD, FACS Pager: [phone] Surgery Office: [phone] 05/28/2017

## 2017-05-28 NOTE — ED Notes (Signed)
Patient denies pain and is resting comfortably.  

## 2017-05-28 NOTE — ED Notes (Signed)
Dr. Lucia Gaskins at bedside.

## 2017-05-28 NOTE — ED Triage Notes (Signed)
PT brought in by wife from home this morning  Pt had a colostomy place on Sept 24th  Pt has been home for 3 weeks  Pt had an episode of vomiting yesterday  Pt is c/o mild abd pain in the middle of his abdomen  Wife states last night she noticed some blood in his colostomy bag  States she emptied the bag at 0430 this morning and it is filling up again with dark bloody liquid  Pt states he feels light headed  Pt states he is on plavix but did not take his medications last night   Pt has not ate or drank this morning

## 2017-05-28 NOTE — ED Provider Notes (Signed)
[facility] DEPT Provider Note   CSN: [id number] Arrival date & time: 05/28/17  0165     History   Chief Complaint Chief Complaint  Patient presents with  . blood in colostomy    HPI Austin [name] is a 61 y.o. male.patient noted to have blood in his colostomy since yesterday. Patient complains of mild diffuse abdominal pain.vomited one time yesterday. No hematemesis no fever. No other associated symptoms.no treatment prior to coming here  HPI  Past Medical History:  Diagnosis Date  . Cerebrovascular accident ([place]) 1994   hx of brainstem stroke, residual diminished lung capacity  . Chronic kidney disease    Left Renal Mass  . Diabetes mellitus without complication (H[facility])    no medications;   . Hx of colonic polyps   . Hypertension   . Hypogonadism   . 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 Neuromuscular disorder ([place])    quadraplegic  . Open abdominal incision with drainage 01/26/2017  . Pituitary adenoma ([place])   . 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 Pneumonia   . 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[place])   . Urinary incontinence   . Venous stasis    edema    Patient Active Problem List   Diagnosis Date Noted  . 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 C. difficile colitis 02/08/2017  . Intra-abdominal abscess ([place]) 01/23/2017  . 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 Perforation of sigmoid colon due to diverticulitis 09/27/2016  . Fatigue 05/23/2015  . Type 2 diabetes mellitus, controlled ([place]) 06/01/2014  . Hyperlipidemia 10/17/2013  . Renal carcinoma metastatic with carcinomatosis 11/09/2012  . Type 2 diabetes mellitus ([place]) 03/31/2012  . Left renal mass 03/11/2012  . Anemia associated with acute blood loss 02/28/2012  . Abdominal pain, other specified site 02/27/2012  . Rectal bleeding 02/27/2012  . Post-polypectomy bleeding 02/27/2012  . Colon cancer screening  02/24/2012  . Low testosterone 06/21/2010  . WEAKNESS 04/03/2010  . FACIAL WEAKNESS 04/03/2010  . EDEMA 01/15/2009  . Pituitary tumor 01/15/2009  . Essential hypertension 10/16/2008  . Neurogenic bladder 10/16/2008  . OTHER SEBORRHEIC DERMATITIS 10/16/2008  . Brainstem stroke ([place]) 10/16/2008  . COLONIC POLYPS, HX OF 10/16/2008    Past Surgical History:  Procedure Laterality Date  . CHOLECYSTECTOMY    . COLON RESECTION N/A 05/04/2017   Procedure: LAPAROSCOPIC SIGMOID COLON RESECTION WITH END COLSOTOMY ERAS PATHWAY;  Surgeon: Alphonsa Overall, MD;  Location: [facility] ORS;  Service: General;  Laterality: N/A;  . COLONOSCOPY  02/27/2012   Procedure: COLONOSCOPY;  Surgeon: Jerene Bears, MD;  Location: [facility] ENDOSCOPY;  Service: Gastroenterology;  Laterality: N/A;  . gamma knife radiation surgery  05/2010   for pituitary  . IR RADIOLOGIST EVAL & MGMT  02/12/2017  . IR RADIOLOGIST EVAL & MGMT  03/17/2017  . IR RADIOLOGIST EVAL & MGMT  04/14/2017  . PITUITARY SURGERY  2007   nose approach  . ROBOT ASSISTED LAPAROSCOPIC NEPHRECTOMY  04/23/2012   Procedure: ROBOTIC ASSISTED LAPAROSCOPIC NEPHRECTOMY;  Surgeon: Alexis Frock, MD;  Location: [facility] ORS;  Service: Urology;  Laterality: Left;  radical  . Robotic Partial Nephrectomy  04-23-12   left   Left Renal Mass  . stomach peg  02/1993   removed 5-6 years later  . TRACHEOSTOMY TUBE PLACEMENT  02/1993   removed 04/1993  . UMBILICAL HERNIA REPAIR  04/23/2012   Procedure: HERNIA REPAIR UMBILICAL ADULT;  Surgeon: Alexis Frock,  MD;  Location: [facility];  Service: Urology;;  . [place]  . vocal cord surgery     injected with collagen, then fat from stomach to improve speech s/p stroke       Home Medications    Prior to Admission medications   Medication Sig Start Date End Date Taking? Authorizing Provider  albuterol (PROVENTIL HFA;VENTOLIN HFA) 108 (90 BASE) MCG/ACT inhaler Inhale 2 puffs into the lungs every 6 (six) hours as needed for wheezing or  shortness of breath. 07/07/15   Debbrah Alar, NP  atorvastatin (LIPITOR) 20 MG tablet Take 20 mg by mouth daily.    [provider]  clopidogrel (PLAVIX) 75 MG tablet TAKE 1 TABLET BY MOUTH AT BEDTIME. 04/27/17   [name], [name], MD  escitalopram (LEXAPRO) 10 MG tablet TAKE ONE TABLET BY MOUTH ONCE DAILY IN THE MORNING 03/16/17   [name], [name], MD  ketoconazole (NIZORAL) 2 % cream APPLY AS NEEDED TO RASH Patient taking differently: APPLY AS NEEDED TO RASH daily 10/23/16   [name], [name], MD  levofloxacin (LEVAQUIN) 500 MG tablet Take 1 tablet (500 mg total) by mouth daily. 05/15/17   [name], [name], MD  lisinopril-hydrochlorothiazide (PRINZIDE,ZESTORETIC) 20-12.5 MG tablet Take 1 tablet by mouth daily.    [provider]  lisinopril-hydrochlorothiazide (PRINZIDE,ZESTORETIC) 20-12.5 MG tablet TAKE ONE TABLET BY MOUTH TWICE DAILY 05/27/17   [name], [name], MD  Multiple Vitamin (MULTIVITAMIN WITH MINERALS) TABS tablet Take 1 tablet by mouth daily.    [provider]  saccharomyces boulardii (FLORASTOR) 250 MG capsule Take 1 capsule (250 mg total) by mouth 2 (two) times daily. 01/30/17   Aline August, MD  testosterone enanthate (DELATESTRYL) 200 MG/ML injection Inject 200 mg into the muscle every 14 (fourteen) days. For IM use only    [provider]  traMADol (ULTRAM) 50 MG tablet Take 50 mg by mouth every 6 (six) hours as needed for moderate pain.    [provider]  traZODone (DESYREL) 100 MG tablet TAKE ONE TABLET BY MOUTH AT BEDTIME 03/16/17   [name], [name], MD  triamcinolone cream (KENALOG) 0.1 % Apply 1 application topically 2 (two) times daily as needed (for rash).    [provider]    Family History Family History  Problem Relation Age of Onset  . Alcohol abuse Father   . Throat cancer Father   . Esophageal cancer Father 40  . Arthritis Mother   . Hyperlipidemia Mother   . Uterine cancer Mother 58  . Colon  cancer Brother   . Arthritis Maternal Grandmother   . Hypertension Brother   . Stroke Paternal Grandmother     Social History Social History  Substance Use Topics  . Smoking status: Former Smoker    Packs/day: 0.50    Years: 23.00    Types: Cigarettes    Quit date: 09/24/1992  . Smokeless tobacco: Never Used  . Alcohol use No     Comment: rare     Allergies   Penicillins   Review of Systems Review of Systems  Constitutional: Negative.   HENT: Negative.   Respiratory: Negative.   Cardiovascular: Negative.   Gastrointestinal: Positive for abdominal pain and vomiting.       Blood in colostomy  Musculoskeletal: Negative.   Skin: Negative.   Allergic/Immunologic: Positive for immunocompromised state.       Metastatic cancer  Neurological: Negative.        Quadriplegic  Psychiatric/Behavioral: Negative.   All other  systems reviewed and are negative.    Physical Exam Updated Vital Signs BP 108/74 (BP Location: Left Arm)   Pulse (!) 103   Temp 97.7 F (36.5 C) (Oral)   Resp 16   SpO2 98%   Physical Exam  Constitutional: No distress.  Chronically ill-appearing  HENT:  Head: Normocephalic and atraumatic.  Eyes: Pupils are equal, round, and reactive to light. Conjunctivae are normal.  Neck: Neck supple. No tracheal deviation present. No thyromegaly present.  Cardiovascular: Normal rate and regular rhythm.   No murmur heard. Pulmonary/Chest: Effort normal and breath sounds normal.  Abdominal: Soft. Bowel sounds are normal. He exhibits no distension. There is no tenderness.  Colostomy at left lower quadrant with maroon blood in bag  Musculoskeletal: Normal range of motion. He exhibits no edema or tenderness.  Neurological: He is alert. Coordination normal.  Skin: Skin is warm and dry. No rash noted.  Psychiatric: He has a normal mood and affect.  Nursing note and vitals reviewed.    ED Treatments / Results  Labs (all labs ordered are listed, but only  abnormal results are displayed) Labs Reviewed  COMPREHENSIVE METABOLIC PANEL  CBC  TYPE AND SCREEN    EKG  EKG Interpretation None       Radiology No results found.  Procedures Procedures (including critical care time)  Medications Ordered in ED Medications - No data to display Results for orders placed or performed during the hospital encounter of 05/28/17  Comprehensive metabolic panel  Result Value Ref Range   Sodium 139 135 - 145 mmol/L   Potassium 5.4 (H) 3.5 - 5.1 mmol/L   Chloride 109 101 - 111 mmol/L   CO2 20 (L) 22 - 32 mmol/L   Glucose, Bld 147 (H) 65 - 99 mg/dL   BUN 59 (H) 6 - 20 mg/dL   Creatinine, Ser 1.62 (H) 0.61 - 1.24 mg/dL   Calcium 9.0 8.9 - 10.3 mg/dL   Total Protein 7.1 6.5 - 8.1 g/dL   Albumin 2.9 (L) 3.5 - 5.0 g/dL   AST 16 15 - 41 U/L   ALT 31 17 - 63 U/L   Alkaline Phosphatase 70 38 - 126 U/L   Total Bilirubin 0.5 0.3 - 1.2 mg/dL   GFR calc non Af Amer 44 (L) >60 mL/min   GFR calc Af Amer 51 (L) >60 mL/min   Anion gap 10 5 - 15  CBC  Result Value Ref Range   WBC 14.1 (H) 4.0 - 10.5 K/uL   RBC 3.35 (L) 4.22 - 5.81 MIL/uL   Hemoglobin 8.9 (L) 13.0 - 17.0 g/dL   HCT 28.2 (L) 39.0 - 52.0 %   MCV 84.2 78.0 - 100.0 fL   MCH 26.6 26.0 - 34.0 pg   MCHC 31.6 30.0 - 36.0 g/dL   RDW 17.2 (H) 11.5 - 15.5 %   Platelets 391 150 - 400 K/uL  Type and screen [place]  Result Value Ref Range   ABO/RH(D) O POS    Antibody Screen NEG    Sample Expiration 05/31/2017    Ct Abdomen Pelvis Wo Contrast  Result Date: 05/28/2017 CLINICAL DATA:  Abdominal pain. EXAM: CT ABDOMEN AND PELVIS WITHOUT CONTRAST TECHNIQUE: Multidetector CT imaging of the abdomen and pelvis was performed following the standard protocol without IV contrast. COMPARISON:  02/05/2017 FINDINGS: Lower chest: Stable left lower lobe lung nodule measuring 1.1 cm, image 20 of series 3. No pleural effusion. No acute abnormality. Hepatobiliary: No focal liver  abnormalities. Previous cholecystectomy. No biliary dilatation. Pancreas: Unremarkable. No pancreatic ductal dilatation or surrounding inflammatory changes. Spleen: Normal in size without focal abnormality. Adrenals/Urinary Tract: Similar appearance of bilateral adrenal nodules including 1.4 cm left adrenal gland nodule, image 29 of series 2. Hyperdense lesion arising from the posterior cortex of the inferior pole of right kidney is unchanged measuring 0.9 cm. No right-sided hydronephrosis. Surgical changes of previous left nephrectomy with region of fat necrosis in the nephrectomy bed. This is similar to previous exam. The urinary bladder appears unremarkable. Stomach/Bowel: Stomach is normal. The small bowel loops have a normal caliber without obstruction. New left lower quadrant colostomy. There is soft tissue stranding within the left lower quadrant of the abdomen at the colostomy site as well as a few small foci of extraluminal gas, image 48 of series 2. Vascular/Lymphatic: Aortic atherosclerosis. No aneurysm. No upper abdominal or pelvic adenopathy. Reproductive: Prostate is unremarkable. Other: Interval removal of left lower quadrant percutaneous drainage catheter. Multifocal peritoneal metastasis consistent with known metastatic renal cell carcinoma. The index lesion within the ventral abdomen is longer visualized and has presumably been resected. Right lower quadrant metastatic deposit measures 6.8 cm, image 54 of series 2. Previously 5.5 cm. Nodule along the undersurface of the right lobe of liver measures 2.1 cm, image 26 of series 2. Previously 1.4 cm. Musculoskeletal: No aggressive lytic or sclerotic bone lesions. IMPRESSION: 1. Since the previous exam of 02/05/2017 the patient has undergone removal of left lower quadrant percutaneous drainage catheter and placement of a left lower quadrant colostomy. The bowel loops have a normal caliber. There is no evidence for obstruction. 2. There is soft tissue  stranding and a few small foci of gas associated with the segment of bowel entering the colostomy site. Findings may reflect recent postoperative change. Inflammation/infection not excluded. No abscess identified however. 3. Metastatic renal cell carcinoma is again noted. The dominant mass within the central abdomen has presumably been resected. Lesion within the right lower quadrant of the abdomen and lesion along the undersurface of the right lobe of liver demonstrates mild increase in size in the interval. 4.  Aortic Atherosclerosis (ICD10-I70.0). Electronically Signed   By: Kerby Moors M.D.   On: 05/28/2017 11:45   Dg Chest 1 View  Result Date: 05/13/2017 CLINICAL DATA:  Fever and cough EXAM: CHEST 1 VIEW COMPARISON:  Chest radiograph November 18, 2013; chest CT April 23, 2015 FINDINGS: There is mild scarring in the base regions. There is no appreciable edema or consolidation. Heart size and pulmonary vascularity are normal. No evident adenopathy. There is aortic atherosclerosis. No bone lesions evident. IMPRESSION: Scarring in the bases. No edema or consolidation. Stable cardiac silhouette. There is aortic atherosclerosis. Aortic Atherosclerosis (ICD10-I70.0). Electronically Signed   By: Lowella Grip III M.D.   On: 05/13/2017 12:20   Initial Impression / Assessment and Plan / ED Course  I have reviewed the triage vital signs and the nursing notes.  Pertinent labs & imaging results that were available during my care of the patient were reviewed by me and considered in my medical decision making (see chart for details).     12:20 PM patient resting comfortably. Denies abdominal pain. He has not put out appreciably more blood in his colostomy bag since arrival here. I've consulted [facility] gastroenterology service who will see patient while in hospital. I've also consulted Dr. Rosana Hoes from hospitalist service who will arrange for overnight stay . Renal insufficiency is new onset over 3 weeks  ago.hemoglobin at 8.9  has dropped less than 1 g over 13 days ago Final Clinical Impressions(s) / ED Diagnoses  Diagnosis #1 acute GI bleeding #2 acute renal insufficiency #3 anemia Final diagnoses:  None    New Prescriptions New Prescriptions   No medications on file     Orlie Dakin, MD 05/28/17 1235

## 2017-05-28 NOTE — ED Notes (Signed)
Patient transported to CT 

## 2017-05-28 NOTE — Consult Note (Signed)
Referring Provider: Dr. Winfred Leeds Primary Care Physician:  [name], MD Primary Gastroenterologist:  Dr. Hilarie Fredrickson  Reason for Consultation:  Blood in ostomy  HPI: Austin [name] is a 61 y.o. 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  Recently had sigmoid resection and diverting end colostomy for abdominal abscess related to diverticulitis.  This was performed by Dr. Lucia Gaskins on 9/24.  Presented to [facility] ED this AM with complaints of blood in ostomy bag since last night.  Also complaining of abdominal pain.  IMPRESSION: 1. Since the previous exam of 02/05/2017 the patient has undergone removal of left lower quadrant percutaneous drainage catheter and placement of a left lower quadrant colostomy. The bowel loops have a normal caliber. There is no evidence for obstruction. 2. There is soft tissue stranding and a few small foci of gas associated with the segment of bowel entering the colostomy site. Findings may reflect recent postoperative change. Inflammation/infection not excluded. No abscess identified however. 3. Metastatic renal cell carcinoma is again noted. The dominant mass within the central abdomen has presumably been resected. Lesion within the right lower quadrant of the abdomen and lesion along the undersurface of the right lobe of liver demonstrates mild increase in size in the interval. 4.  Aortic Atherosclerosis (ICD10-I70.0).  I was in the ER seeing the patient with Dr. Lucia Gaskins.  Piece of necrotic tissue protruding from the ostomy.  He took a picture of this and uploaded it to media.  Dr. Lucia Gaskins is going to remove this and wants to observe for the next 24-48 hours.  Blood is darker in color so question if it is coming from higher up in the colon rather than right at the ostomy site.  Past Medical History:  Diagnosis Date  .  Cerebrovascular accident ([place]) 1994   hx of brainstem stroke, residual diminished lung capacity  . Chronic kidney disease    Left Renal Mass  . Diabetes mellitus without complication (HCC)    no medications;   . Hx of colonic polyps   . Hypertension   . Hypogonadism   . 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 Neuromuscular disorder ([place])    quadraplegic  . Open abdominal incision with drainage 01/26/2017  . Pituitary adenoma ([place])   . 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 Pneumonia   . 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[place])   . Urinary incontinence   . Venous stasis    edema    Past Surgical History:  Procedure Laterality Date  . CHOLECYSTECTOMY    . COLON RESECTION N/A 05/04/2017   Procedure: LAPAROSCOPIC SIGMOID COLON RESECTION WITH END COLSOTOMY ERAS PATHWAY;  Surgeon: Alphonsa Overall, MD;  Location: [facility] ORS;  Service: General;  Laterality: N/A;  . COLONOSCOPY  02/27/2012   Procedure: COLONOSCOPY;  Surgeon: Jerene Bears, MD;  Location: [facility] END[facility];  Service: Gastroenterology;  Laterality: N/A;  . gamma knife radiation surgery  05/2010   for pituitary  . IR RADIOLOGIST EVAL & MGMT  02/12/2017  . IR RADIOLOGIST EVAL & MGMT  03/17/2017  . IR RADIOLOGIST EVAL & MGMT  04/14/2017  . PITUITARY SURGERY  2007   nose approach  . ROBOT ASSISTED LAPAROSCOPIC NEPHRECTOMY  04/23/2012   Procedure: ROBOTIC ASSISTED LAPAROSCOPIC NEPHRECTOMY;  Surgeon: Alexis Frock, MD;  Location: [facility];  Service: Urology;  Laterality: Left;  radical  . Robotic Partial Nephrectomy  04-23-12   left   Left Renal Mass  . stomach peg  02/1993   removed 5-6 years later  . TRACHEOSTOMY TUBE PLACEMENT  02/1993   removed 04/1993  . UMBILICAL HERNIA REPAIR  04/23/2012   Procedure: HERNIA REPAIR UMBILICAL ADULT;  Surgeon: Alexis Frock, MD;  Location: [facility];  Service: Urology;;  . [name]  . vocal cord surgery     injected with collagen, then fat from  stomach to improve speech s/p stroke    Prior to Admission medications   Medication Sig Start Date End Date Taking? Authorizing Provider  acetaminophen (TYLENOL) 500 MG tablet Take 1,500 mg by mouth every 6 (six) hours as needed for mild pain, moderate pain, fever or headache.   Yes [provider]  albuterol (PROVENTIL HFA;VENTOLIN HFA) 108 (90 BASE) MCG/ACT inhaler Inhale 2 puffs into the lungs every 6 (six) hours as needed for wheezing or shortness of breath. 07/07/15  Yes Debbrah Alar, NP  atorvastatin (LIPITOR) 20 MG tablet Take 20 mg by mouth daily with breakfast.    Yes [provider]  cetirizine (ZYRTEC) 10 MG tablet Take 10 mg by mouth daily with breakfast.   Yes [provider]  clopidogrel (PLAVIX) 75 MG tablet TAKE 1 TABLET BY MOUTH AT BEDTIME. 04/27/17  Yes [name], [name], MD  escitalopram (LEXAPRO) 10 MG tablet TAKE ONE TABLET BY MOUTH ONCE DAILY IN THE MORNING 03/16/17  Yes [name], [name], MD  ketoconazole (NIZORAL) 2 % cream APPLY AS NEEDED TO RASH Patient taking differently: Apply to groin or legs daily as needed for rash 10/23/16  Yes [name], [name], MD  lisinopril-hydrochlorothiazide (PRINZIDE,ZESTORETIC) 20-12.5 MG tablet TAKE ONE TABLET BY MOUTH TWICE DAILY Patient taking differently: TAKE ONE TABLET BY MOUTH once every day 05/27/17  Yes [name], [name], MD  Melatonin 3 MG TABS Take 3 mg by mouth at bedtime as needed (for sleep).   Yes [provider]  Multiple Vitamin (MULTIVITAMIN WITH MINERALS) TABS tablet Take 1 tablet by mouth daily with breakfast.    Yes [provider]  saccharomyces boulardii (FLORASTOR) 250 MG capsule Take 1 capsule (250 mg total) by mouth 2 (two) times daily. 01/30/17  Yes Aline August, MD  testosterone enanthate (DELATESTRYL) 200 MG/ML injection Inject 200 mg into the muscle every 14 (fourteen) days. For IM use only   Yes [provider]  traMADol (ULTRAM) 50 MG tablet Take 50  mg by mouth every 6 (six) hours as needed for moderate pain.   Yes [provider]  traZODone (DESYREL) 100 MG tablet TAKE ONE TABLET BY MOUTH AT BEDTIME 03/16/17  Yes [name], [name], MD  triamcinolone cream (KENALOG) 0.1 % Apply 1 application topically 2 (two) times daily as needed (for rash). Applies to face   Yes [provider]  levofloxacin (LEVAQUIN) 500 MG tablet Take 1 tablet (500 mg total) by mouth daily. Patient not taking: Reported on 05/28/2017 05/15/17   [name], MD    No current facility-administered medications for this encounter.    Current Outpatient Prescriptions  Medication Sig Dispense Refill  . acetaminophen (TYLENOL) 500 MG tablet Take 1,500 mg by mouth every 6 (six) hours as needed for mild pain, moderate pain, fever or headache.    . albuterol (PROVENTIL HFA;VENTOLIN HFA) 108 (90 BASE) MCG/ACT inhaler Inhale 2 puffs into the lungs every 6 (six) hours as needed for wheezing or shortness of  breath. 1 Inhaler 0  . atorvastatin (LIPITOR) 20 MG tablet Take 20 mg by mouth daily with breakfast.     . cetirizine (ZYRTEC) 10 MG tablet Take 10 mg by mouth daily with breakfast.    . clopidogrel (PLAVIX) 75 MG tablet TAKE 1 TABLET BY MOUTH AT BEDTIME. 90 tablet 1  . escitalopram (LEXAPRO) 10 MG tablet TAKE ONE TABLET BY MOUTH ONCE DAILY IN THE MORNING 90 tablet 1  . ketoconazole (NIZORAL) 2 % cream APPLY AS NEEDED TO RASH (Patient taking differently: Apply to groin or legs daily as needed for rash) 60 g 1  . lisinopril-hydrochlorothiazide (PRINZIDE,ZESTORETIC) 20-12.5 MG tablet TAKE ONE TABLET BY MOUTH TWICE DAILY (Patient taking differently: TAKE ONE TABLET BY MOUTH once every day) 180 tablet 2  . Melatonin 3 MG TABS Take 3 mg by mouth at bedtime as needed (for sleep).    . Multiple Vitamin (MULTIVITAMIN WITH MINERALS) TABS tablet Take 1 tablet by mouth daily with breakfast.     . saccharomyces boulardii (FLORASTOR) 250 MG capsule Take 1 capsule (250 mg  total) by mouth 2 (two) times daily. 20 capsule 0  . testosterone enanthate (DELATESTRYL) 200 MG/ML injection Inject 200 mg into the muscle every 14 (fourteen) days. For IM use only    . traMADol (ULTRAM) 50 MG tablet Take 50 mg by mouth every 6 (six) hours as needed for moderate pain.    . traZODone (DESYREL) 100 MG tablet TAKE ONE TABLET BY MOUTH AT BEDTIME 90 tablet 1  . triamcinolone cream (KENALOG) 0.1 % Apply 1 application topically 2 (two) times daily as needed (for rash). Applies to face    . levofloxacin (LEVAQUIN) 500 MG tablet Take 1 tablet (500 mg total) by mouth daily. (Patient not taking: Reported on 05/28/2017) 7 tablet 0    Allergies as of 05/28/2017 - Review Complete 05/28/2017  Allergen Reaction Noted  . Penicillins Rash 10/16/2008    Family History  Problem Relation Age of Onset  . Alcohol abuse Father   . Throat cancer Father   . Esophageal cancer Father 70  . Arthritis Mother   . Hyperlipidemia Mother   . Uterine cancer Mother 31  . Colon cancer Brother   . Arthritis Maternal Grandmother   . Hypertension Brother   . Stroke Paternal Grandmother     Social History   Social History  . Marital status: Married    Spouse name: N/A  . Number of children: 3  . Years of education: N/A   Occupational History  . disabled Unemployed   Social History Main Topics  . Smoking status: Former Smoker    Packs/day: 0.50    Years: 23.00    Types: Cigarettes    Quit date: 09/24/1992  . Smokeless tobacco: Never Used  . Alcohol use No     Comment: rare  . Drug use: No  . Sexual activity: Not on file   Other Topics Concern  . Not on file   Social History Narrative   Retired, disabled [place]: ROS is O/W negative except as mentioned in HPI.  Physical Exam: Vital signs in last 24 hours: Temp:  [97.7 F (36.5 C)-98.8 F (37.1 C)] 98.8 F (37.1 C) (10/18 1137) Pulse Rate:  [92-107] 102 (10/18 1137) Resp:  [16-20] 18 (10/18  1137) BP: (108-123)/(66-84) 123/74 (10/18 1137) SpO2:  [97 %-100 %] 98 % (10/18 1137)   General:  Alert, Well-developed, well-nourished, pleasant and cooperative in NAD Head:  Normocephalic and atraumatic. Eyes:  Sclera clear, no icterus.  Conjunctiva pink. Ears:  Normal auditory acuity. Mouth:  No deformity or lesions.   Lungs:  Clear throughout to auscultation.  No wheezes, crackles, or rhonchi.  Heart:  Slightly tachy.  No murmurs. Abdomen:  Soft, non-distended.  BS present.  Mild TTP.  Ostomy noted with piece of necrotic tissue protruding from the site and dark blood bubbling from the ostomy. Msk:  Symmetrical without gross deformities. Pulses:  Normal pulses noted. Extremities:  Without clubbing or edema. Neurologic:  Alert and oriented x 4; quadriplegic with extremity contractures. Skin:  Intact without significant lesions or rashes.  Lab Results:  Recent Labs  05/28/17 0759  WBC 14.1*  HGB 8.9*  HCT 28.2*  PLT 391   BMET  Recent Labs  05/28/17 0759  NA 139  K 5.4*  CL 109  CO2 20*  GLUCOSE 147*  BUN 59*  CREATININE 1.62*  CALCIUM 9.0   LFT  Recent Labs  05/28/17 0759  PROT 7.1  ALBUMIN 2.9*  AST 16  ALT 31  ALKPHOS 70  BILITOT 0.5   Studies/Results: Ct Abdomen Pelvis Wo Contrast  Result Date: 05/28/2017 CLINICAL DATA:  Abdominal pain. EXAM: CT ABDOMEN AND PELVIS WITHOUT CONTRAST TECHNIQUE: Multidetector CT imaging of the abdomen and pelvis was performed following the standard protocol without IV contrast. COMPARISON:  02/05/2017 FINDINGS: Lower chest: Stable left lower lobe lung nodule measuring 1.1 cm, image 20 of series 3. No pleural effusion. No acute abnormality. Hepatobiliary: No focal liver abnormalities. Previous cholecystectomy. No biliary dilatation. Pancreas: Unremarkable. No pancreatic ductal dilatation or surrounding inflammatory changes. Spleen: Normal in size without focal abnormality. Adrenals/Urinary Tract: Similar appearance of  bilateral adrenal nodules including 1.4 cm left adrenal gland nodule, image 29 of series 2. Hyperdense lesion arising from the posterior cortex of the inferior pole of right kidney is unchanged measuring 0.9 cm. No right-sided hydronephrosis. Surgical changes of previous left nephrectomy with region of fat necrosis in the nephrectomy bed. This is similar to previous exam. The urinary bladder appears unremarkable. Stomach/Bowel: Stomach is normal. The small bowel loops have a normal caliber without obstruction. New left lower quadrant colostomy. There is soft tissue stranding within the left lower quadrant of the abdomen at the colostomy site as well as a few small foci of extraluminal gas, image 48 of series 2. Vascular/Lymphatic: Aortic atherosclerosis. No aneurysm. No upper abdominal or pelvic adenopathy. Reproductive: Prostate is unremarkable. Other: Interval removal of left lower quadrant percutaneous drainage catheter. Multifocal peritoneal metastasis consistent with known metastatic renal cell carcinoma. The index lesion within the ventral abdomen is longer visualized and has presumably been resected. Right lower quadrant metastatic deposit measures 6.8 cm, image 54 of series 2. Previously 5.5 cm. Nodule along the undersurface of the right lobe of liver measures 2.1 cm, image 26 of series 2. Previously 1.4 cm. Musculoskeletal: No aggressive lytic or sclerotic bone lesions. IMPRESSION: 1. Since the previous exam of 02/05/2017 the patient has undergone removal of left lower quadrant percutaneous drainage catheter and placement of a left lower quadrant colostomy. The bowel loops have a normal caliber. There is no evidence for obstruction. 2. There is soft tissue stranding and a few small foci of gas associated with the segment of bowel entering the colostomy site. Findings may reflect recent postoperative change. Inflammation/infection not excluded. No abscess identified however. 3. Metastatic renal cell  carcinoma is again noted. The dominant mass within the central abdomen has presumably been resected. Lesion within  the right lower quadrant of the abdomen and lesion along the undersurface of the right lobe of liver demonstrates mild increase in size in the interval. 4.  Aortic Atherosclerosis (ICD10-I70.0). Electronically Signed   By: Kerby Moors M.D.   On: 05/28/2017 11:45   IMPRESSION:  *Blood in ostomy and abdominal pain:  Recently had sigmoid resection and diverting end colostomy for abdominal abscess related to diverticulitis.  This was performed by Dr. Lucia Gaskins on 9/24.  Piece of necrotic tissue protruding from the ostomy (? Polyp vs sloughed colonic tissue).  Dr. Lucia Gaskins is going to remove this and wants to observe for the next 24-48 hours. *Metastatic/stage IV renal cell carcinoma with peritoneal involvement and splenic mets.   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  PLAN: *Will follow with surgery and observe bleeding. *Monitor Hgb.   ZEHR, JESSICA D.  05/28/2017, 12:14 PM  Pager number [phone]  ________________________________________________________________________  Velora Heckler GI MD note:  I personally examined the patient, reviewed the data and agree with the assessment and plan described above.  Will follow along. I discussed with Dr. Lucia Gaskins on the phone this afternoon.  We have concern for bowel ischemia or perhaps tumor ingrowth (met intimately involved with cecum) sloughing, bleeding. For now, holding on endoscopic intervention.     Owens Loffler, MD [facility] Gastroenterology Pager [phone]

## 2017-05-29 DIAGNOSIS — K922 Gastrointestinal hemorrhage, unspecified: Secondary | ICD-10-CM | POA: Diagnosis present

## 2017-05-29 DIAGNOSIS — K2980 Duodenitis without bleeding: Secondary | ICD-10-CM | POA: Diagnosis not present

## 2017-05-29 DIAGNOSIS — Z933 Colostomy status: Secondary | ICD-10-CM | POA: Diagnosis not present

## 2017-05-29 DIAGNOSIS — I10 Essential (primary) hypertension: Secondary | ICD-10-CM | POA: Diagnosis present

## 2017-05-29 DIAGNOSIS — K5730 Diverticulosis of large intestine without perforation or abscess without bleeding: Secondary | ICD-10-CM

## 2017-05-29 DIAGNOSIS — N179 Acute kidney failure, unspecified: Secondary | ICD-10-CM | POA: Diagnosis present

## 2017-05-29 DIAGNOSIS — K5720 Diverticulitis of large intestine with perforation and abscess without bleeding: Secondary | ICD-10-CM | POA: Diagnosis present

## 2017-05-29 DIAGNOSIS — N319 Neuromuscular dysfunction of bladder, unspecified: Secondary | ICD-10-CM

## 2017-05-29 DIAGNOSIS — C649 Malignant neoplasm of unspecified kidney, except renal pelvis: Secondary | ICD-10-CM | POA: Diagnosis not present

## 2017-05-29 DIAGNOSIS — G8250 Quadriplegia, unspecified: Secondary | ICD-10-CM | POA: Diagnosis present

## 2017-05-29 DIAGNOSIS — R1084 Generalized abdominal pain: Secondary | ICD-10-CM | POA: Diagnosis not present

## 2017-05-29 DIAGNOSIS — K269 Duodenal ulcer, unspecified as acute or chronic, without hemorrhage or perforation: Secondary | ICD-10-CM | POA: Diagnosis not present

## 2017-05-29 DIAGNOSIS — Z87891 Personal history of nicotine dependence: Secondary | ICD-10-CM | POA: Diagnosis not present

## 2017-05-29 DIAGNOSIS — Z993 Dependence on wheelchair: Secondary | ICD-10-CM | POA: Diagnosis not present

## 2017-05-29 DIAGNOSIS — E1165 Type 2 diabetes mellitus with hyperglycemia: Secondary | ICD-10-CM | POA: Diagnosis present

## 2017-05-29 DIAGNOSIS — Z79899 Other long term (current) drug therapy: Secondary | ICD-10-CM | POA: Diagnosis not present

## 2017-05-29 DIAGNOSIS — C642 Malignant neoplasm of left kidney, except renal pelvis: Secondary | ICD-10-CM | POA: Diagnosis present

## 2017-05-29 DIAGNOSIS — D62 Acute posthemorrhagic anemia: Secondary | ICD-10-CM | POA: Diagnosis present

## 2017-05-29 DIAGNOSIS — E875 Hyperkalemia: Secondary | ICD-10-CM

## 2017-05-29 DIAGNOSIS — E872 Acidosis: Secondary | ICD-10-CM | POA: Diagnosis present

## 2017-05-29 DIAGNOSIS — R933 Abnormal findings on diagnostic imaging of other parts of digestive tract: Secondary | ICD-10-CM

## 2017-05-29 DIAGNOSIS — E119 Type 2 diabetes mellitus without complications: Secondary | ICD-10-CM

## 2017-05-29 DIAGNOSIS — I69920 Aphasia following unspecified cerebrovascular disease: Secondary | ICD-10-CM | POA: Diagnosis not present

## 2017-05-29 DIAGNOSIS — Z7902 Long term (current) use of antithrombotics/antiplatelets: Secondary | ICD-10-CM | POA: Diagnosis not present

## 2017-05-29 DIAGNOSIS — K264 Chronic or unspecified duodenal ulcer with hemorrhage: Secondary | ICD-10-CM | POA: Diagnosis present

## 2017-05-29 DIAGNOSIS — D500 Iron deficiency anemia secondary to blood loss (chronic): Secondary | ICD-10-CM | POA: Diagnosis not present

## 2017-05-29 DIAGNOSIS — Z9049 Acquired absence of other specified parts of digestive tract: Secondary | ICD-10-CM | POA: Diagnosis not present

## 2017-05-29 DIAGNOSIS — I471 Supraventricular tachycardia: Secondary | ICD-10-CM | POA: Diagnosis not present

## 2017-05-29 DIAGNOSIS — Z85528 Personal history of other malignant neoplasm of kidney: Secondary | ICD-10-CM | POA: Diagnosis not present

## 2017-05-29 DIAGNOSIS — Z880 Allergy status to penicillin: Secondary | ICD-10-CM | POA: Diagnosis not present

## 2017-05-29 DIAGNOSIS — C786 Secondary malignant neoplasm of retroperitoneum and peritoneum: Secondary | ICD-10-CM | POA: Diagnosis present

## 2017-05-29 DIAGNOSIS — E785 Hyperlipidemia, unspecified: Secondary | ICD-10-CM | POA: Diagnosis present

## 2017-05-29 DIAGNOSIS — K2970 Gastritis, unspecified, without bleeding: Secondary | ICD-10-CM | POA: Diagnosis not present

## 2017-05-29 DIAGNOSIS — Z905 Acquired absence of kidney: Secondary | ICD-10-CM

## 2017-05-29 LAB — BASIC METABOLIC PANEL
ANION GAP: 7 (ref 5–15)
ANION GAP: 9 (ref 5–15)
ANION GAP: 8 (ref 5–15)
Anion gap: 8 (ref 5–15)
Anion gap: 7 (ref 5–15)
BUN: 75 mg/dL — ABNORMAL HIGH (ref 6–20)
BUN: 80 mg/dL — AB (ref 6–20)
BUN: 83 mg/dL — ABNORMAL HIGH (ref 6–20)
BUN: 81 mg/dL — AB (ref 6–20)
BUN: 73 mg/dL — ABNORMAL HIGH (ref 6–20)
CALCIUM: 8.1 mg/dL — AB (ref 8.9–10.3)
CALCIUM: 7.9 mg/dL — AB (ref 8.9–10.3)
CO2: 18 mmol/L — ABNORMAL LOW (ref 22–32)
CO2: 17 mmol/L — ABNORMAL LOW (ref 22–32)
CO2: 16 mmol/L — ABNORMAL LOW (ref 22–32)
CO2: 17 mmol/L — ABNORMAL LOW (ref 22–32)
CO2: 18 mmol/L — ABNORMAL LOW (ref 22–32)
Calcium: 7.9 mg/dL — ABNORMAL LOW (ref 8.9–10.3)
Calcium: 7.8 mg/dL — ABNORMAL LOW (ref 8.9–10.3)
Calcium: 8 mg/dL — ABNORMAL LOW (ref 8.9–10.3)
Chloride: 113 mmol/L — ABNORMAL HIGH (ref 101–111)
Chloride: 114 mmol/L — ABNORMAL HIGH (ref 101–111)
Chloride: 118 mmol/L — ABNORMAL HIGH (ref 101–111)
Chloride: 117 mmol/L — ABNORMAL HIGH (ref 101–111)
Chloride: 116 mmol/L — ABNORMAL HIGH (ref 101–111)
Creatinine, Ser: 1.62 mg/dL — ABNORMAL HIGH (ref 0.61–1.24)
Creatinine, Ser: 1.7 mg/dL — ABNORMAL HIGH (ref 0.61–1.24)
Creatinine, Ser: 1.66 mg/dL — ABNORMAL HIGH (ref 0.61–1.24)
Creatinine, Ser: 1.62 mg/dL — ABNORMAL HIGH (ref 0.61–1.24)
Creatinine, Ser: 1.57 mg/dL — ABNORMAL HIGH (ref 0.61–1.24)
GFR calc Af Amer: 48 mL/min — ABNORMAL LOW (ref >60)
GFR calc Af Amer: 51 mL/min — ABNORMAL LOW (ref >60)
GFR calc Af Amer: 53 mL/min — ABNORMAL LOW (ref >60)
GFR calc non Af Amer: 46 mL/min — ABNORMAL LOW (ref >60)
GFR, EST AFRICAN AMERICAN: 51 mL/min — AB (ref >60)
GFR, EST AFRICAN AMERICAN: 49 mL/min — AB (ref >60)
GFR, EST NON AFRICAN AMERICAN: 44 mL/min — AB (ref >60)
GFR, EST NON AFRICAN AMERICAN: 41 mL/min — AB (ref >60)
GFR, EST NON AFRICAN AMERICAN: 43 mL/min — AB (ref >60)
GFR, EST NON AFRICAN AMERICAN: 44 mL/min — AB (ref >60)
GLUCOSE: 131 mg/dL — AB (ref 65–99)
GLUCOSE: 162 mg/dL — AB (ref 65–99)
Glucose, Bld: 157 mg/dL — ABNORMAL HIGH (ref 65–99)
Glucose, Bld: 168 mg/dL — ABNORMAL HIGH (ref 65–99)
Glucose, Bld: 164 mg/dL — ABNORMAL HIGH (ref 65–99)
POTASSIUM: 6.4 mmol/L — AB (ref 3.5–5.1)
Potassium: 6 mmol/L — ABNORMAL HIGH (ref 3.5–5.1)
Potassium: 5.9 mmol/L — ABNORMAL HIGH (ref 3.5–5.1)
Potassium: 6.5 mmol/L (ref 3.5–5.1)
Potassium: 6.2 mmol/L — ABNORMAL HIGH (ref 3.5–5.1)
SODIUM: 138 mmol/L (ref 135–145)
SODIUM: 142 mmol/L (ref 135–145)
Sodium: 140 mmol/L (ref 135–145)
Sodium: 142 mmol/L (ref 135–145)
Sodium: 141 mmol/L (ref 135–145)

## 2017-05-29 LAB — URINALYSIS, ROUTINE W REFLEX MICROSCOPIC
Bilirubin Urine: NEGATIVE
Glucose, UA: NEGATIVE mg/dL
Hgb urine dipstick: NEGATIVE
Ketones, ur: NEGATIVE mg/dL
LEUKOCYTES UA: NEGATIVE
Nitrite: NEGATIVE
PROTEIN: NEGATIVE mg/dL
Specific Gravity, Urine: 1.017 (ref 1.005–1.030)
pH: 5 (ref 5.0–8.0)

## 2017-05-29 LAB — CBC WITH DIFFERENTIAL/PLATELET
BASOS ABS: 0.1 10*3/uL (ref 0.0–0.1)
BASOS PCT: 0 %
EOS PCT: 0 %
Eosinophils Absolute: 0 10*3/uL (ref 0.0–0.7)
HCT: 25.9 % — ABNORMAL LOW (ref 39.0–52.0)
Hemoglobin: 8.3 g/dL — ABNORMAL LOW (ref 13.0–17.0)
Lymphocytes Relative: 8 %
Lymphs Abs: 1.7 10*3/uL (ref 0.7–4.0)
MCH: 27.9 pg (ref 26.0–34.0)
MCHC: 32 g/dL (ref 30.0–36.0)
MCV: 87.2 fL (ref 78.0–100.0)
MONO ABS: 2.3 10*3/uL — AB (ref 0.1–1.0)
Monocytes Relative: 11 %
NEUTROS ABS: 16.5 10*3/uL — AB (ref 1.7–7.7)
Neutrophils Relative %: 80 %
PLATELETS: 304 10*3/uL (ref 150–400)
RBC: 2.97 MIL/uL — ABNORMAL LOW (ref 4.22–5.81)
RDW: 16.9 % — AB (ref 11.5–15.5)
WBC: 20.6 10*3/uL — ABNORMAL HIGH (ref 4.0–10.5)

## 2017-05-29 LAB — CBC
HCT: 19.8 % — ABNORMAL LOW (ref 39.0–52.0)
HEMOGLOBIN: 6.4 g/dL — AB (ref 13.0–17.0)
MCH: 27.4 pg (ref 26.0–34.0)
MCHC: 32.3 g/dL (ref 30.0–36.0)
MCV: 84.6 fL (ref 78.0–100.0)
Platelets: 390 10*3/uL (ref 150–400)
RBC: 2.34 MIL/uL — ABNORMAL LOW (ref 4.22–5.81)
RDW: 17.2 % — ABNORMAL HIGH (ref 11.5–15.5)
WBC: 18.7 10*3/uL — ABNORMAL HIGH (ref 4.0–10.5)

## 2017-05-29 LAB — GLUCOSE, CAPILLARY: Glucose-Capillary: 183 mg/dL — ABNORMAL HIGH (ref 65–99)

## 2017-05-29 LAB — PREPARE RBC (CROSSMATCH)

## 2017-05-29 MED ORDER — SODIUM BICARBONATE 8.4 % IV SOLN
50.0000 meq | Freq: Once | INTRAVENOUS | Status: AC
  Administered 2017-05-29: 50 meq via INTRAVENOUS
  Filled 2017-05-29: qty 50

## 2017-05-29 MED ORDER — LIP MEDEX EX OINT
TOPICAL_OINTMENT | CUTANEOUS | Status: AC
  Administered 2017-05-29: 1
  Filled 2017-05-29: qty 7

## 2017-05-29 MED ORDER — SODIUM BICARBONATE 8.4 % IV SOLN
50.0000 meq | Freq: Once | INTRAVENOUS | Status: AC
Start: 2017-05-29 — End: 2017-05-29
  Administered 2017-05-29: 50 meq via INTRAVENOUS
  Filled 2017-05-29: qty 50

## 2017-05-29 MED ORDER — INSULIN ASPART 100 UNIT/ML ~~LOC~~ SOLN
0.0000 [IU] | Freq: Three times a day (TID) | SUBCUTANEOUS | Status: DC
  Administered 2017-05-30 – 2017-06-01 (×4): 1 [IU] via SUBCUTANEOUS
  Administered 2017-06-02: 2 [IU] via SUBCUTANEOUS

## 2017-05-29 MED ORDER — INSULIN NPH (HUMAN) (ISOPHANE) 100 UNIT/ML ~~LOC~~ SUSP: 5.0000 [IU] | Freq: Once | SUBCUTANEOUS | Status: DC

## 2017-05-29 MED ORDER — INSULIN ASPART 100 UNIT/ML IV SOLN
5.0000 [IU] | Freq: Once | INTRAVENOUS | Status: AC
  Administered 2017-05-29: 5 [IU] via INTRAVENOUS

## 2017-05-29 MED ORDER — SODIUM CHLORIDE 0.9 % IV SOLN
Freq: Once | INTRAVENOUS | Status: AC
  Administered 2017-05-31: 10 mL/h via INTRAVENOUS

## 2017-05-29 MED ORDER — ACETAMINOPHEN 325 MG PO TABS: 325.0000 mg | ORAL_TABLET | Freq: Four times a day (QID) | ORAL | Status: DC | PRN

## 2017-05-29 MED ORDER — SODIUM CHLORIDE 0.9 % IV SOLN
INTRAVENOUS | Status: AC
  Administered 2017-05-29: 16:00:00 via INTRAVENOUS

## 2017-05-29 MED ORDER — SODIUM CHLORIDE 0.9 % IV SOLN
Freq: Once | INTRAVENOUS | Status: AC
  Administered 2017-05-29: 10 mL/h via INTRAVENOUS

## 2017-05-29 MED ORDER — SODIUM CHLORIDE 0.9 % IV SOLN
1.0000 g | Freq: Once | INTRAVENOUS | Status: AC
  Administered 2017-05-29: 1 g via INTRAVENOUS
  Filled 2017-05-29: qty 10

## 2017-05-29 MED ORDER — SODIUM CHLORIDE 0.9 % IV BOLUS (SEPSIS)
500.0000 mL | Freq: Once | INTRAVENOUS | Status: AC
  Administered 2017-05-29: 500 mL via INTRAVENOUS

## 2017-05-29 MED ORDER — ACETAMINOPHEN 325 MG PO TABS
325.0000 mg | ORAL_TABLET | Freq: Four times a day (QID) | ORAL | Status: DC | PRN
  Administered 2017-05-29: 650 mg via ORAL
  Filled 2017-05-29: qty 2

## 2017-05-29 MED ORDER — INSULIN ASPART 100 UNIT/ML ~~LOC~~ SOLN
5.0000 [IU] | Freq: Once | SUBCUTANEOUS | Status: AC
  Administered 2017-05-29: 5 [IU] via INTRAVENOUS

## 2017-05-29 MED ORDER — SODIUM BICARBONATE 650 MG PO TABS
650.0000 mg | ORAL_TABLET | Freq: Every day | ORAL | Status: DC
  Administered 2017-05-30: 650 mg via ORAL
  Filled 2017-05-29: qty 1

## 2017-05-29 NOTE — Progress Notes (Signed)
Lab called with critical value of K+ 6.5, Dr Erlinda Hong was notified. I had administered 5u of novolog and 50mg  of bicarb after this blood draw. Pt is not symptomatic but will continue to monitor and await any new orders.

## 2017-05-29 NOTE — Progress Notes (Signed)
[facility] Progress Note  CC:  Bleeding via ostomy  Subjective:  Feels ok other than fatigued.  No abdominal pain.  Hgb 6.4 grams this AM so getting 2 units PRCB's.  Still bleeding, dark blood.  Bag was changed 4 times overnight.  Last changed at 8:30 am and not much in there currently, but what is there is dark blood.  ? If it is slowing down.  Objective:  Vital signs in last 24 hours: Temp:  [97.6 F (36.4 C)-99.4 F (37.4 C)] 98.8 F (37.1 C) (10/19 1141) Pulse Rate:  [98-124] 106 (10/19 1141) Resp:  [16-20] 17 (10/19 1141) BP: (73-138)/(43-75) 117/68 (10/19 1141) SpO2:  [97 %-100 %] 100 % (10/19 1141) Weight:  [165 lb 12.8 oz (75.2 kg)] 165 lb 12.8 oz (75.2 kg) (10/18 1738) Last BM Date: 05/27/17 (normal BM without blood) General:  Alert, Well-developed, in NAD Heart:  Slightly tachy; no murmurs Pulm:  CTAB.  No increased WOB. Abdomen:  Soft, non-distended.  BS present.  Non-tender.  Ostomy noted in LUQ with dark blood in the bag.  Extremities:  Without edema. Neurologic:  Alert and oriented x 4; quadriplegic with extremity contractures.  Lab Results:  Recent Labs  05/28/17 0759 05/28/17 1730 05/29/17 0530  WBC 14.1*  --  18.7*  HGB 8.9* 7.5* 6.4*  HCT 28.2* 23.6* 19.8*  PLT 391  --  390   BMET  Recent Labs  05/28/17 0759 05/29/17 0530 05/29/17 0858  NA 139 138 140  K 5.4* 6.0* 5.9*  CL 109 113* 114*  CO2 20* 18* 17*  GLUCOSE 147* 131* 162*  BUN 59* 75* 80*  CREATININE 1.62* 1.62* 1.70*  CALCIUM 9.0 8.1* 7.9*   LFT  Recent Labs  05/28/17 0759  PROT 7.1  ALBUMIN 2.9*  AST 16  ALT 31  ALKPHOS 70  BILITOT 0.5   Ct Abdomen Pelvis Wo Contrast  Result Date: 05/28/2017 CLINICAL DATA:  Abdominal pain. EXAM: CT ABDOMEN AND PELVIS WITHOUT CONTRAST TECHNIQUE: Multidetector CT imaging of the abdomen and pelvis was performed following the standard protocol without IV contrast. COMPARISON:  02/05/2017 FINDINGS: Lower chest: Stable left  lower lobe lung nodule measuring 1.1 cm, image 20 of series 3. No pleural effusion. No acute abnormality. Hepatobiliary: No focal liver abnormalities. Previous cholecystectomy. No biliary dilatation. Pancreas: Unremarkable. No pancreatic ductal dilatation or surrounding inflammatory changes. Spleen: Normal in size without focal abnormality. Adrenals/Urinary Tract: Similar appearance of bilateral adrenal nodules including 1.4 cm left adrenal gland nodule, image 29 of series 2. Hyperdense lesion arising from the posterior cortex of the inferior pole of right kidney is unchanged measuring 0.9 cm. No right-sided hydronephrosis. Surgical changes of previous left nephrectomy with region of fat necrosis in the nephrectomy bed. This is similar to previous exam. The urinary bladder appears unremarkable. Stomach/Bowel: Stomach is normal. The small bowel loops have a normal caliber without obstruction. New left lower quadrant colostomy. There is soft tissue stranding within the left lower quadrant of the abdomen at the colostomy site as well as a few small foci of extraluminal gas, image 48 of series 2. Vascular/Lymphatic: Aortic atherosclerosis. No aneurysm. No upper abdominal or pelvic adenopathy. Reproductive: Prostate is unremarkable. Other: Interval removal of left lower quadrant percutaneous drainage catheter. Multifocal peritoneal metastasis consistent with known metastatic renal cell carcinoma. The index lesion within the ventral abdomen is longer visualized and has presumably been resected. Right lower quadrant metastatic deposit measures 6.8 cm, image 54 of series 2. Previously 5.5  cm. Nodule along the undersurface of the right lobe of liver measures 2.1 cm, image 26 of series 2. Previously 1.4 cm. Musculoskeletal: No aggressive lytic or sclerotic bone lesions. IMPRESSION: 1. Since the previous exam of 02/05/2017 the patient has undergone removal of left lower quadrant percutaneous drainage catheter and placement of  a left lower quadrant colostomy. The bowel loops have a normal caliber. There is no evidence for obstruction. 2. There is soft tissue stranding and a few small foci of gas associated with the segment of bowel entering the colostomy site. Findings may reflect recent postoperative change. Inflammation/infection not excluded. No abscess identified however. 3. Metastatic renal cell carcinoma is again noted. The dominant mass within the central abdomen has presumably been resected. Lesion within the right lower quadrant of the abdomen and lesion along the undersurface of the right lobe of liver demonstrates mild increase in size in the interval. 4.  Aortic Atherosclerosis (ICD10-I70.0). Electronically Signed   By: Kerby Moors M.D.   On: 05/28/2017 11:45   Assessment / Plan: *Blood in ostomy and abdominal pain:  Recently had sigmoid resection and diverting end colostomy for abdominal abscess related to diverticulitis.  This was performed by Dr. Lucia Gaskins on 9/24.  Piece of necrotic tissue protruding from the ostomy (? Polyp vs sloughed colonic tissue).  Dr. Lucia Gaskins removed it and sent it to pathology.  ? If this was even the site of bleeding since the blood is dark in color.  Concern for bowel ischemia (but is not having any pain) or perhaps tumor ingrowth (met intimately involved with cecum on CT scan).  Still bleeding, ? Slowed down slightly this AM. *ABLA:  Hgb down to 6.4 grams this AM.  Receiving 2 units PRBC's. *Metastatic/stage IV renal cell carcinoma with peritoneal involvement and splenic mets.   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  05/29/2017, 12:01 PM  Pager number [phone]  ________________________________________________________________________  Velora Heckler GI MD note:  I personally examined the patient, reviewed the data and agree with the assessment and  plan described above.  I spoke with Dr. Lucia Gaskins and agree to follow him clinically for now, waiting for full plavix washout (should be by Sunday since his last dose was [place]).   Owens Loffler, MD [facility] Gastroenterology Pager [phone]

## 2017-05-29 NOTE — Progress Notes (Signed)
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 S/p calcium gluconate Keep on tele  AKI: likely prerenal, ua on infection Continue hydration   Diabetes, not on meds at home Elevated blood sugar Start ssi  H/o brain stem stroke /quadriplegia/neurogenic bladder at base line.  Renal carcinoma metastatic with carcinomatosis- noted, not getting treatment since 2015 Slow growing per oncology note  S/P nephrectomy  Code Status: full  Family Communication: patient and wife at bedside  Disposition Plan: remain in the hospital, need general surgery and gi clearance   Consultants:  General surgery  GI  Procedures:  bedside removal of 6cm black dark tissue hanging from ostomy by Dr Lucia Gaskins on 10/18, pathology pending  Antibiotics:  none   Objective: BP 138/75 (BP Location: Left Arm)   Pulse (!) 122   Temp 97.9 F (36.6 C) (Oral)   Resp 18   Ht 6' (1.829 m)   Wt 75.2 kg (165 lb 12.8 oz)   SpO2 100%   BMI 22.49 kg/m   Intake/Output Summary (Last 24 hours) at 05/29/17 0837 Last data filed at 05/29/17 2505  Gross per 24 hour  Intake              375 ml    Output             1325 ml  Net             -950 ml   Filed Weights   05/28/17 1738  Weight: 75.2 kg (165 lb 12.8 oz)    Exam: Patient is examined daily including today on 05/29/2017, exams remain the same as of yesterday except that has changed    General:  NAD, aphasia, quadraplegia at baseline, + foley,  Cardiovascular: RRR  Respiratory: CTABL  Abdomen: Soft/ND/NT, positive BS, + colostomy   Musculoskeletal: No Edema  Neuro: alert, oriented , aphasia, quadraplegia at baseline. At baseline he can bear weight and help her pivot himself from bed to wheelchair and can use his right arm some.  He has sensation throughout.   Data Reviewed: Basic Metabolic Panel:  Recent Labs Lab 05/28/17 0759 05/29/17 0530  NA 139 138  K 5.4* 6.0*  CL 109 113*  CO2 20* 18*  GLUCOSE 147* 131*  BUN 59* 75*  CREATININE 1.62* 1.62*  CALCIUM 9.0 8.1*   Liver Function Tests:  Recent Labs Lab 05/28/17 0759  AST 16  ALT 31  ALKPHOS 70  BILITOT 0.5  PROT 7.1  ALBUMIN 2.9*   No results for input(s): LIPASE, AMYLASE in the last 168 hours. No results for input(s): AMMONIA in the last 168 hours. CBC:  Recent Labs Lab 05/28/17 0759 05/28/17 1730 05/29/17 0530  WBC 14.1*  --  18.7*  HGB 8.9* 7.5* 6.4*  HCT 28.2* 23.6* 19.8*  MCV 84.2  --  84.6  PLT 391  --  390   Cardiac Enzymes:   No results for input(s): CKTOTAL, CKMB, CKMBINDEX, TROPONINI in the last 168 hours. BNP (last 3 results) No results for input(s): BNP in the last 8760 hours.  ProBNP (last 3 results) No results for input(s): PROBNP in the last 8760 hours.  CBG: No results for input(s): GLUCAP in the last 168 hours.  No results found for this or any previous visit (from the past 240 hour(s)).   Studies: Ct Abdomen Pelvis Wo Contrast  Result Date: 05/28/2017 CLINICAL DATA:  Abdominal pain. EXAM: CT ABDOMEN AND PELVIS WITHOUT CONTRAST TECHNIQUE: Multidetector CT imaging of the abdomen and pelvis was  performed following the standard protocol without IV contrast. COMPARISON:  02/05/2017 FINDINGS: Lower chest: Stable left lower lobe lung nodule measuring 1.1 cm, image 20 of series 3. No pleural effusion. No acute abnormality. Hepatobiliary: No focal liver abnormalities. Previous cholecystectomy. No biliary dilatation. Pancreas: Unremarkable. No pancreatic ductal dilatation or surrounding inflammatory changes. Spleen: Normal in size without focal abnormality. Adrenals/Urinary Tract: Similar appearance of bilateral adrenal nodules including 1.4 cm left adrenal gland nodule, image 29 of series 2. Hyperdense lesion arising from the posterior cortex of the inferior pole of right kidney is unchanged measuring 0.9 cm. No right-sided hydronephrosis. Surgical changes of previous left nephrectomy with region of fat necrosis in the nephrectomy bed. This is similar to previous exam. The urinary bladder appears unremarkable. Stomach/Bowel: Stomach is normal. The small bowel loops have a normal caliber without obstruction. New left lower quadrant colostomy. There is soft tissue stranding within the left lower quadrant of the abdomen at the colostomy site as well as a few small foci of extraluminal gas, image 48 of series 2. Vascular/Lymphatic: Aortic atherosclerosis. No aneurysm. No upper abdominal or pelvic adenopathy. Reproductive: Prostate is unremarkable. Other: Interval removal of left lower quadrant percutaneous drainage catheter. Multifocal peritoneal metastasis consistent with known metastatic renal cell carcinoma. The index lesion within the ventral abdomen is longer visualized and has presumably been resected. Right lower quadrant metastatic deposit measures 6.8 cm, image 54 of series 2. Previously 5.5 cm. Nodule along the undersurface of the right lobe of liver measures 2.1 cm, image 26 of series 2. Previously 1.4 cm. Musculoskeletal: No aggressive lytic or sclerotic bone lesions. IMPRESSION: 1. Since the previous  exam of 02/05/2017 the patient has undergone removal of left lower quadrant percutaneous drainage catheter and placement of a left lower quadrant colostomy. The bowel loops have a normal caliber. There is no evidence for obstruction. 2. There is soft tissue stranding and a few small foci of gas associated with the segment of bowel entering the colostomy site. Findings may reflect recent postoperative change. Inflammation/infection not excluded. No abscess identified however. 3. Metastatic renal cell carcinoma is again noted. The dominant mass within the central abdomen has presumably been resected. Lesion within the right lower quadrant of the abdomen and lesion along the undersurface of the right lobe of liver demonstrates mild increase in size in the interval. 4.  Aortic Atherosclerosis (ICD10-I70.0). Electronically Signed   By: Kerby Moors M.D.   On: 05/28/2017 11:45    Scheduled Meds: . atorvastatin  20 mg Oral Q breakfast  . saccharomyces boulardii  250 mg Oral BID  . traZODone  100 mg Oral QHS    Continuous Infusions: . sodium chloride    . sodium chloride    .  sodium chloride 500 mL (05/29/17 0803)     Time spent: 78mins I have personally reviewed and interpreted on  05/29/2017 daily labs, tele strips, imagings as discussed above under date review session and assessment and plans.  I reviewed all nursing notes, pharmacy notes, consultant notes,  vitals, pertinent old records  I have discussed plan of care as described above with RN , patient and family on 05/29/2017   Shenandoah Yeats MD, PhD  [facility] Pager [phone]. If 7PM-7AM, please contact night-coverage at [email or web address], password [facility] 05/29/2017, 8:37 AM  LOS: 0 days

## 2017-05-29 NOTE — Consult Note (Signed)
[place] Nurse ostomy follow up Stoma type/location: LUQ colostomy.   Stomal assessment/size: 1" (assessed through pouch) Surgeon excised tissue yesterday that was protruding from stoma, pouch replaced then.  Peristomal assessment: intact, this is from picture in EMR taken yesterday. Treatment options for stomal/peristomal skin: skin barrier ring Output: black bloody drainage Ostomy pouching: 2pc. convex 2 1/4" inch pouching system with skin barrier ring Education provided:Wife states she only needs supplies which I have obtained for them. Enrolled patient in [place] [facility] Discharge program: NA [place] nursing team will follow, and will remain available to this patient, the nursing, surgical and medical teams.  Please re-consult if needed.  Fara Olden, RN-C, WTA-C, [place] [facility] Associate

## 2017-05-29 NOTE — Progress Notes (Signed)
Notified NP of patient's hemoglobin of 6.4 this am .

## 2017-05-29 NOTE — Progress Notes (Addendum)
[facility] Progress Note      Assessment/Plan   Anemia associated with acute blood loss  From colon - probable ischemic segment of colon    Essential hypertension   Neurogenic bladder   COLONIC POLYPS, HX OF   Renal carcinoma metastatic with carcinomatosis  - s/p left radical nephrectomy 04/23/2012 - Dr. Tresa Moore, peritoneal metastasis 2015  - Followed by Dr. 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 Diff - recurrent    Hx of Pituitary tumor in 1994  Bleeding from colon - Questionable slough of colon versus colonic polyp vs renal cell ca eroded into right colon   - S/P LAPAROSCOPIC SIGMOID COLON RESECTION WITH END COLSOTOMY, resection of metastatic carcinoma (omental met), enterolysis -  05/04/2017, Dr. Lucia Gaskins,  - Path - colon - diverticulitis with perforation, metastatic clear cell ca  FEN: clears, IVF VTE: SCD's ID: none currently Follow up: TBD  DISPO: - pt receiving 2U PRBC today.   Continue to hold anticoagulants. IV fluids, clear liquid diet, follow labs, follow H&H, hold plavix (and all anticoagulation).   Based on ostomy output, bleeding appears to be decreasing.   LOS: 0 days    Subjective:  CC: fatigue  Wife at bedside. Pt is feeling fatigued and frustrated. He is not having any pain or nausea. Wife is requesting an air mattress. Pt is requesting something to drink. No new complaints. Second unit of blood running at the time of my visit. Wife states ostomy bag was emptied at 0830 today and there is roughly 25cc of blood in the bag at 1415 today.   Objective: Vital signs in last 24 hours: Temp:  [97.6 F (36.4 C)-99.4 F (37.4 C)] 98.8 F (37.1 C) (10/19 1141) Pulse Rate:  [98-124] 106 (10/19 1141) Resp:  [16-20]  17 (10/19 1141) BP: (73-138)/(43-75) 117/68 (10/19 1141) SpO2:  [97 %-100 %] 100 % (10/19 1141) Weight:  [165 lb 12.8 oz (75.2 kg)] 165 lb 12.8 oz (75.2 kg) (10/18 1738) Last BM Date: 05/27/17 (normal BM without blood)  Intake/Output from previous day: 10/18 0701 - 10/19 0700 In: 375 [I.V.:375] Out: 1125 [Urine:325; Stool:800] Intake/Output this shift: Total I/O In: 667 [Blood:667] Out: 500 [Urine:350; Stool:150]  PE: Gen:  Alert, NAD, pleasant, cooperative Card:  RRR, no M/G/R heard Pulm:  CTA, no W/R/R, rate and effort normal Abd: Soft, not distended, good BS, stoma pink, minimal dark red blood in the bag, no stool, no TTP Skin: no rashes noted, warm and dry   Anti-infectives: Anti-infectives    None      Lab Results:   Recent Labs  05/28/17 0759 05/28/17 1730 05/29/17 0530  WBC 14.1*  --  18.7*  HGB 8.9* 7.5* 6.4*  HCT 28.2* 23.6* 19.8*  PLT 391  --  390   BMET  Recent Labs  05/29/17 0530 05/29/17 0858  NA 138 140  K 6.0* 5.9*  CL 113* 114*  CO2 18* 17*  GLUCOSE 131* 162*  BUN 75* 80*  CREATININE 1.62* 1.70*  CALCIUM 8.1* 7.9*   PT/INR No results for input(s): LABPROT, INR in the last 72 hours. CMP     Component Value Date/Time   NA 140 05/29/2017 0858   NA 138 03/13/2017 4492  K 5.9 (H) 05/29/2017 0858   K 4.6 03/13/2017 0922   CL 114 (H) 05/29/2017 0858   CO2 17 (L) 05/29/2017 0858   CO2 24 03/13/2017 0922   GLUCOSE 162 (H) 05/29/2017 0858   GLUCOSE 91 03/13/2017 0922   BUN 80 (H) 05/29/2017 0858   BUN 27.4 (H) 03/13/2017 0922   CREATININE 1.70 (H) 05/29/2017 0858   CREATININE 1.3 03/13/2017 0922   CALCIUM 7.9 (L) 05/29/2017 0858   CALCIUM 9.3 03/13/2017 0922   PROT 7.1 05/28/2017 0759   PROT 7.9 03/13/2017 0922   ALBUMIN 2.9 (L) 05/28/2017 0759   ALBUMIN 3.0 (L) 03/13/2017 0922   AST 16 05/28/2017 0759   AST 17 03/13/2017 0922   ALT 31 05/28/2017 0759   ALT 21 03/13/2017 0922   ALKPHOS 70 05/28/2017 0759   ALKPHOS 91  03/13/2017 0922   BILITOT 0.5 05/28/2017 0759   BILITOT 0.27 03/13/2017 0922   GFRNONAA 41 (L) 05/29/2017 0858   GFRAA 48 (L) 05/29/2017 0858   Lipase     Component Value Date/Time   LIPASE 43 01/23/2017 1345    Studies/Results: Ct Abdomen Pelvis Wo Contrast  Result Date: 05/28/2017 CLINICAL DATA:  Abdominal pain. EXAM: CT ABDOMEN AND PELVIS WITHOUT CONTRAST TECHNIQUE: Multidetector CT imaging of the abdomen and pelvis was performed following the standard protocol without IV contrast. COMPARISON:  02/05/2017 FINDINGS: Lower chest: Stable left lower lobe lung nodule measuring 1.1 cm, image 20 of series 3. No pleural effusion. No acute abnormality. Hepatobiliary: No focal liver abnormalities. Previous cholecystectomy. No biliary dilatation. Pancreas: Unremarkable. No pancreatic ductal dilatation or surrounding inflammatory changes. Spleen: Normal in size without focal abnormality. Adrenals/Urinary Tract: Similar appearance of bilateral adrenal nodules including 1.4 cm left adrenal gland nodule, image 29 of series 2. Hyperdense lesion arising from the posterior cortex of the inferior pole of right kidney is unchanged measuring 0.9 cm. No right-sided hydronephrosis. Surgical changes of previous left nephrectomy with region of fat necrosis in the nephrectomy bed. This is similar to previous exam. The urinary bladder appears unremarkable. Stomach/Bowel: Stomach is normal. The small bowel loops have a normal caliber without obstruction. New left lower quadrant colostomy. There is soft tissue stranding within the left lower quadrant of the abdomen at the colostomy site as well as a few small foci of extraluminal gas, image 48 of series 2. Vascular/Lymphatic: Aortic atherosclerosis. No aneurysm. No upper abdominal or pelvic adenopathy. Reproductive: Prostate is unremarkable. Other: Interval removal of left lower quadrant percutaneous drainage catheter. Multifocal peritoneal metastasis consistent with known  metastatic renal cell carcinoma. The index lesion within the ventral abdomen is longer visualized and has presumably been resected. Right lower quadrant metastatic deposit measures 6.8 cm, image 54 of series 2. Previously 5.5 cm. Nodule along the undersurface of the right lobe of liver measures 2.1 cm, image 26 of series 2. Previously 1.4 cm. Musculoskeletal: No aggressive lytic or sclerotic bone lesions. IMPRESSION: 1. Since the previous exam of 02/05/2017 the patient has undergone removal of left lower quadrant percutaneous drainage catheter and placement of a left lower quadrant colostomy. The bowel loops have a normal caliber. There is no evidence for obstruction. 2. There is soft tissue stranding and a few small foci of gas associated with the segment of bowel entering the colostomy site. Findings may reflect recent postoperative change. Inflammation/infection not excluded. No abscess identified however. 3. Metastatic renal cell carcinoma is again noted. The dominant mass within the central abdomen has presumably been resected. Lesion within the right  lower quadrant of the abdomen and lesion along the undersurface of the right lobe of liver demonstrates mild increase in size in the interval. 4.  Aortic Atherosclerosis (ICD10-I70.0). Electronically Signed   By: Kerby Moors M.D.   On: 05/28/2017 11:45      Kalman Drape , [facility] Surgery 05/29/2017, 12:25 PM Pager: [phone] Consults: [phone] Mon-Fri 7:00 am-4:30 pm Sat-Sun 7:00 am-11:30 am  Agree with above. Will start clear liquids. He is having no abdominal symptoms.  The old blood in the ostomy appears to be less. Will recheck Hgb after 2nd unit of PRBC  Wife and daughter at bedside.  Alphonsa Overall, MD, [facility] Surgery Pager: [phone] Office phone:  [phone]

## 2017-05-30 ENCOUNTER — Encounter (HOSPITAL_COMMUNITY): Payer: Self-pay | Admitting: Surgery

## 2017-05-30 DIAGNOSIS — Z933 Colostomy status: Secondary | ICD-10-CM

## 2017-05-30 LAB — BASIC METABOLIC PANEL
ANION GAP: 7 (ref 5–15)
BUN: 71 mg/dL — AB (ref 6–20)
CHLORIDE: 117 mmol/L — AB (ref 101–111)
CO2: 19 mmol/L — ABNORMAL LOW (ref 22–32)
CREATININE: 1.6 mg/dL — AB (ref 0.61–1.24)
Calcium: 7.8 mg/dL — ABNORMAL LOW (ref 8.9–10.3)
GFR calc Af Amer: 52 mL/min — ABNORMAL LOW (ref >60)
GFR calc non Af Amer: 45 mL/min — ABNORMAL LOW (ref >60)
GLUCOSE: 161 mg/dL — AB (ref 65–99)
POTASSIUM: 5.3 mmol/L — AB (ref 3.5–5.1)
Sodium: 143 mmol/L (ref 135–145)

## 2017-05-30 LAB — CBC WITH DIFFERENTIAL/PLATELET
Basophils Absolute: 0 10*3/uL (ref 0.0–0.1)
Basophils Relative: 0 %
EOS ABS: 0 10*3/uL (ref 0.0–0.7)
Eosinophils Relative: 0 %
HEMATOCRIT: 20.4 % — AB (ref 39.0–52.0)
HEMOGLOBIN: 6.8 g/dL — AB (ref 13.0–17.0)
LYMPHS ABS: 1.4 10*3/uL (ref 0.7–4.0)
LYMPHS PCT: 6 %
MCH: 29.8 pg (ref 26.0–34.0)
MCHC: 33.3 g/dL (ref 30.0–36.0)
MCV: 89.5 fL (ref 78.0–100.0)
MONOS PCT: 10 %
Monocytes Absolute: 2.4 10*3/uL — ABNORMAL HIGH (ref 0.1–1.0)
NEUTROS ABS: 19.5 10*3/uL — AB (ref 1.7–7.7)
NEUTROS PCT: 83 %
Platelets: 289 10*3/uL (ref 150–400)
RBC: 2.28 MIL/uL — ABNORMAL LOW (ref 4.22–5.81)
RDW: 17.7 % — ABNORMAL HIGH (ref 11.5–15.5)
WBC: 23.4 10*3/uL — ABNORMAL HIGH (ref 4.0–10.5)

## 2017-05-30 LAB — HEMOGLOBIN A1C
Hgb A1c MFr Bld: 5.6 % (ref 4.8–5.6)
Mean Plasma Glucose: 114.02 mg/dL

## 2017-05-30 LAB — CBC
HCT: 26.7 % — ABNORMAL LOW (ref 39.0–52.0)
Hemoglobin: 8.8 g/dL — ABNORMAL LOW (ref 13.0–17.0)
MCH: 29.4 pg (ref 26.0–34.0)
MCHC: 33 g/dL (ref 30.0–36.0)
MCV: 89.3 fL (ref 78.0–100.0)
PLATELETS: 207 10*3/uL (ref 150–400)
RBC: 2.99 MIL/uL — ABNORMAL LOW (ref 4.22–5.81)
RDW: 17 % — AB (ref 11.5–15.5)
WBC: 17.9 10*3/uL — ABNORMAL HIGH (ref 4.0–10.5)

## 2017-05-30 LAB — GLUCOSE, CAPILLARY
GLUCOSE-CAPILLARY: 146 mg/dL — AB (ref 65–99)
GLUCOSE-CAPILLARY: 110 mg/dL — AB (ref 65–99)
Glucose-Capillary: 149 mg/dL — ABNORMAL HIGH (ref 65–99)

## 2017-05-30 LAB — PREPARE RBC (CROSSMATCH)

## 2017-05-30 MED ORDER — SODIUM CHLORIDE 0.9 % IV SOLN
Freq: Once | INTRAVENOUS | Status: AC
  Administered 2017-05-30: 11:00:00 via INTRAVENOUS

## 2017-05-30 MED ORDER — PEG-KCL-NACL-NASULF-NA ASC-C 100 G PO SOLR
0.5000 | Freq: Once | ORAL | Status: AC
  Administered 2017-05-30: 100 g via ORAL
  Filled 2017-05-30: qty 1

## 2017-05-30 MED ORDER — PEG-KCL-NACL-NASULF-NA ASC-C 100 G PO SOLR
0.5000 | Freq: Once | ORAL | Status: AC
  Administered 2017-05-31: 100 g via ORAL

## 2017-05-30 MED ORDER — SODIUM BICARBONATE 650 MG PO TABS
650.0000 mg | ORAL_TABLET | Freq: Two times a day (BID) | ORAL | Status: DC
  Administered 2017-05-30 – 2017-06-02 (×5): 650 mg via ORAL
  Filled 2017-05-30 (×5): qty 1

## 2017-05-30 MED ORDER — PEG-KCL-NACL-NASULF-NA ASC-C 100 G PO SOLR: 1.0000 | Freq: Two times a day (BID) | ORAL | Status: DC

## 2017-05-30 MED ORDER — PANTOPRAZOLE SODIUM 40 MG IV SOLR
40.0000 mg | Freq: Two times a day (BID) | INTRAVENOUS | Status: DC
  Administered 2017-05-30 – 2017-06-02 (×6): 40 mg via INTRAVENOUS
  Filled 2017-05-30 (×6): qty 40

## 2017-05-30 NOTE — Progress Notes (Addendum)
[facility] Progress Note    Since last GI note: HB down to 6.8 this morning. Getting another 2 units blood today. Persistently having dark stool, blood in ostomy.  No abd pains.  Objective: Vital signs in last 24 hours: Temp:  [97.8 F (36.6 C)-99.1 F (37.3 C)] 99 F (37.2 C) (10/20 1013) Pulse Rate:  [99-115] 105 (10/20 1013) Resp:  [16-20] 16 (10/20 1013) BP: (104-149)/(60-73) 149/73 (10/20 1013) SpO2:  [99 %-100 %] 100 % (10/20 1013) Weight:  [153 lb 10.6 oz (69.7 kg)-155 lb 6.8 oz (70.5 kg)] 155 lb 6.8 oz (70.5 kg) (10/20 0600) Last BM Date: 05/29/17 (colostomy ) General: alert and oriented times 3 Heart: regular rate and rythm Abdomen: soft, non-tender, non-distended, normal bowel sounds   Lab Results:  Recent Labs  05/29/17 0530 05/29/17 1621 05/30/17 0744  WBC 18.7* 20.6* 23.4*  HGB 6.4* 8.3* 6.8*  PLT 390 304 289  MCV 84.6 87.2 89.5    Recent Labs  05/29/17 1621 05/29/17 1816 05/29/17 2226  NA 142 142 141  K 6.5* 6.4* 6.2*  CL 118* 117* 116*  CO2 16* 17* 18*  GLUCOSE 157* 168* 164*  BUN 83* 81* 73*  CREATININE 1.66* 1.62* 1.57*  CALCIUM 7.9* 7.8* 8.0*    Recent Labs  05/28/17 0759  PROT 7.1  ALBUMIN 2.9*  AST 16  ALT 31  ALKPHOS 70  BILITOT 0.5   Studies/Results: Ct Abdomen Pelvis Wo Contrast  Result Date: 05/28/2017 CLINICAL DATA:  Abdominal pain. EXAM: CT ABDOMEN AND PELVIS WITHOUT CONTRAST TECHNIQUE: Multidetector CT imaging of the abdomen and pelvis was performed following the standard protocol without IV contrast. COMPARISON:  02/05/2017 FINDINGS: Lower chest: Stable left lower lobe lung nodule measuring 1.1 cm, image 20 of series 3. No pleural effusion. No acute abnormality. Hepatobiliary: No focal liver abnormalities. Previous cholecystectomy. No biliary dilatation. Pancreas: Unremarkable. No pancreatic ductal dilatation or surrounding inflammatory changes. Spleen: Normal in size without focal abnormality.  Adrenals/Urinary Tract: Similar appearance of bilateral adrenal nodules including 1.4 cm left adrenal gland nodule, image 29 of series 2. Hyperdense lesion arising from the posterior cortex of the inferior pole of right kidney is unchanged measuring 0.9 cm. No right-sided hydronephrosis. Surgical changes of previous left nephrectomy with region of fat necrosis in the nephrectomy bed. This is similar to previous exam. The urinary bladder appears unremarkable. Stomach/Bowel: Stomach is normal. The small bowel loops have a normal caliber without obstruction. New left lower quadrant colostomy. There is soft tissue stranding within the left lower quadrant of the abdomen at the colostomy site as well as a few small foci of extraluminal gas, image 48 of series 2. Vascular/Lymphatic: Aortic atherosclerosis. No aneurysm. No upper abdominal or pelvic adenopathy. Reproductive: Prostate is unremarkable. Other: Interval removal of left lower quadrant percutaneous drainage catheter. Multifocal peritoneal metastasis consistent with known metastatic renal cell carcinoma. The index lesion within the ventral abdomen is longer visualized and has presumably been resected. Right lower quadrant metastatic deposit measures 6.8 cm, image 54 of series 2. Previously 5.5 cm. Nodule along the undersurface of the right lobe of liver measures 2.1 cm, image 26 of series 2. Previously 1.4 cm. Musculoskeletal: No aggressive lytic or sclerotic bone lesions. IMPRESSION: 1. Since the previous exam of 02/05/2017 the patient has undergone removal of left lower quadrant percutaneous drainage catheter and placement of a left lower quadrant colostomy. The bowel loops have a normal caliber. There is no evidence for obstruction. 2. There is soft tissue stranding and a  few small foci of gas associated with the segment of bowel entering the colostomy site. Findings may reflect recent postoperative change. Inflammation/infection not excluded. No abscess  identified however. 3. Metastatic renal cell carcinoma is again noted. The dominant mass within the central abdomen has presumably been resected. Lesion within the right lower quadrant of the abdomen and lesion along the undersurface of the right lobe of liver demonstrates mild increase in size in the interval. 4.  Aortic Atherosclerosis (ICD10-I70.0). Electronically Signed   By: Kerby Moors M.D.   On: 05/28/2017 11:45     Medications: Scheduled Meds: . atorvastatin  20 mg Oral Q breakfast  . insulin aspart  0-9 Units Subcutaneous TID WC  . saccharomyces boulardii  250 mg Oral BID  . sodium bicarbonate  650 mg Oral Daily  . traZODone  100 mg Oral QHS   Continuous Infusions: . sodium chloride Stopped (05/29/17 0905)  . sodium chloride 75 mL/hr at 05/29/17 1556  . sodium chloride     PRN Meds:.acetaminophen, albuterol, ondansetron **OR** ondansetron (ZOFRAN) IV    Assessment/Plan: 61 y.o. male with GI bleeding  I'm increasingly concerned about possible UGI source.  Could be melena, and BUN/Cr are elevated vs. His usual. No risk factors for ulcer (no NSAIDs).  I am going to put him on IV PPI and will plan on EGD tomorrow morning after another 2 units today.  Last plavix 4 days ago.   Milus Banister, MD  05/30/2017, 10:27 AM [facility] Pager [phone]

## 2017-05-30 NOTE — Progress Notes (Signed)
PROGRESS NOTE  Austin [name] TWS:[id number] DOB: [birth year] DOA: 05/28/2017 PCP: [name], MD  HPI/Recap of past 24 hours:  Denies abdominal pain, no sob, no fever Continued dark bloody output from colostomy, hgb dropped again Repeat potassium level pending Wife at bedside  Assessment/Plan: Principal Problem:   GIB (gastrointestinal bleeding) Active Problems:   Essential hypertension   Neurogenic bladder   COLONIC POLYPS, HX OF   Anemia associated with acute blood loss   Type 2 diabetes mellitus ([place])   Renal carcinoma metastatic with carcinomatosis   Hyperlipidemia   Type 2 diabetes mellitus, controlled ([place])   Perforation of sigmoid colon due to diverticulitis s/p colostomy 9/18   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 -S/p calcium gluconate -Keep on tele -await repeat bmp  AKI (h/o single kidney):  likely prerenal, ua on infection Continue hydration -cr improving, good urine output   Diabetes, not on meds at home Elevated blood sugar Start ssi  H/o brain stem stroke /quadriplegia/neurogenic bladder at base line.  Renal carcinoma metastatic with carcinomatosis- noted, not getting treatment since 2015 Slow growing per oncology note  S/P nephrectomy  Code Status: full  Family Communication: patient and wife at bedside  Disposition Plan: remain in the hospital, need general surgery and gi clearance   Consultants:  General surgery  GI  Procedures:  bedside  removal of 6cm black dark tissue hanging from ostomy by Dr Lucia Gaskins on 10/18, pathology pending  prbc transfusion  Antibiotics:  none   Objective: BP 129/60 (BP Location: Left Wrist)   Pulse 99   Temp 98.8 F (37.1 C) (Oral)   Resp 19   Ht 6' (1.829 m)   Wt 70.5 kg (155 lb 6.8 oz)   SpO2 99%   BMI 21.08 kg/m   Intake/Output Summary (Last 24 hours) at 05/30/17 0810 Last data filed at 05/30/17 0600  Gross per 24 hour  Intake             1377 ml  Output             1800 ml  Net             -423 ml   Filed Weights   05/28/17 1738 05/29/17 2000 05/30/17 0600  Weight: 75.2 kg (165 lb 12.8 oz) 69.7 kg (153 lb 10.6 oz) 70.5 kg (155 lb 6.8 oz)    Exam: Patient is examined daily including today on 05/30/2017, exams remain the same as of yesterday except that has changed    General:  NAD, aphasia, quadraplegia at baseline, + chronic foley,  Cardiovascular: RRR  Respiratory: CTABL  Abdomen: Soft/ND/NT, positive BS, + colostomy with dark bloody output  Musculoskeletal: No Edema  Neuro: alert, oriented , aphasia, quadraplegia at baseline. At baseline he can bear weight and help her pivot himself from bed to wheelchair and can use his right arm some.  He has sensation throughout.   Data Reviewed: Basic Metabolic Panel:  Recent Labs Lab 05/29/17 0530 05/29/17 0858 05/29/17  1621 05/29/17 1816 05/29/17 2226  NA 138 140 142 142 141  K 6.0* 5.9* 6.5* 6.4* 6.2*  CL 113* 114* 118* 117* 116*  CO2 18* 17* 16* 17* 18*  GLUCOSE 131* 162* 157* 168* 164*  BUN 75* 80* 83* 81* 73*  CREATININE 1.62* 1.70* 1.66* 1.62* 1.57*  CALCIUM 8.1* 7.9* 7.9* 7.8* 8.0*   Liver Function Tests:  Recent Labs Lab 05/28/17 0759  AST 16  ALT 31  ALKPHOS 70  BILITOT 0.5  PROT 7.1  ALBUMIN 2.9*   No results for input(s): LIPASE, AMYLASE in the last 168 hours. No results for input(s): AMMONIA in the last 168 hours. CBC:  Recent Labs Lab 05/28/17 0759 05/28/17 1730 05/29/17 0530  05/29/17 1621  WBC 14.1*  --  18.7* 20.6*  NEUTROABS  --   --   --  16.5*  HGB 8.9* 7.5* 6.4* 8.3*  HCT 28.2* 23.6* 19.8* 25.9*  MCV 84.2  --  84.6 87.2  PLT 391  --  390 304   Cardiac Enzymes:   No results for input(s): CKTOTAL, CKMB, CKMBINDEX, TROPONINI in the last 168 hours. BNP (last 3 results) No results for input(s): BNP in the last 8760 hours.  ProBNP (last 3 results) No results for input(s): PROBNP in the last 8760 hours.  CBG:  Recent Labs Lab 05/29/17 2032 05/30/17 0723  GLUCAP 183* 149*    No results found for this or any previous visit (from the past 240 hour(s)).   Studies: No results found.  Scheduled Meds: . atorvastatin  20 mg Oral Q breakfast  . insulin aspart  0-9 Units Subcutaneous TID WC  . saccharomyces boulardii  250 mg Oral BID  . sodium bicarbonate  650 mg Oral Daily  . traZODone  100 mg Oral QHS    Continuous Infusions: . sodium chloride Stopped (05/29/17 0905)  . sodium chloride 75 mL/hr at 05/29/17 1556     Time spent: 19mins I have personally reviewed and interpreted on  05/30/2017 daily labs, tele strips, imagings as discussed above under date review session and assessment and plans.  I reviewed all nursing notes, pharmacy notes, consultant notes,  vitals, pertinent old records  I have discussed plan of care as described above with RN , patient and family on 05/30/2017   Landi Biscardi MD, PhD  [facility] Pager [phone]. If 7PM-7AM, please contact night-coverage at [email or web address], password [facility] 05/30/2017, 8:10 AM  LOS: 1 day

## 2017-05-30 NOTE — Progress Notes (Signed)
[facility] Office:  [phone] General Surgery Progress Note   LOS: 1 day  POD -     Chief Complaint: Bleeding at colostomy  Assessment and Plan: 1.  Bleeding from GI tract, probably colon  Continues with dark maroon stools  Hgb - 6.8 - 05/30/2017 (2 units yesterday)  Plan:  Transfuse 2 more units and follow H/H, agree with endoscopy tomorrow.  Will talk to Dr. Ardis Hughs and consider colonoscopy at the same time.    2. .  LAPAROSCOPIC SIGMOID COLON RESECTION WITH END COLSOTOMY, resection of metastatic carcinoma (omental met), enterolysis - 05/04/2017 - D. [place] - colon - diverticulitis with perforation, metastatic clear cell ca  3. 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 Tresa Moore, peritoneal metastasis 2015 Followed by Dr. Jerilynn Mages 4. ANTICOAGULATED (Z79.01) Impression: On plavix - but he has been off of this since 05/26/2017 5. QUADRIPLEGIA (G82.50) Story: brain stem stroke in 1993 Quadraplegic from brainstem stroke around [place] bound - his wife takes good care of him  6. C. Diff - recurrent 7. Pituitary tumor in 1994 8.  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  No abdominal pain. Wife at bedside.  Discussed plan.  Objective:   Vitals:   05/30/17 1013  05/30/17 1058  BP: (!) 149/73 126/71  Pulse: (!) 105 (!) 105  Resp: 16 17  Temp: 99 F (37.2 C) 97.9 F (36.6 C)  SpO2: 100% 98%     Intake/Output from previous day:  10/19 0701 - 10/20 0700 In: 9983 [P.O.:240; I.V.:155; Blood:982] Out: 2000 [Urine:1400; JASNK:539]  Intake/Output this shift:  No intake/output data recorded.   Physical Exam:   General: WN WM who is alert and oriented.   Speech pattern hard to follow because of stroke.   HEENT: Normal. Pupils equal. .   Lungs: Clear   Abdomen: Soft   Left sided ostomy - with maroon stools.   Ostomy after being cleaned up - mucosa viable.   (05/28/2017)   Lab Results:     Recent Labs  05/29/17 1621 05/30/17 0744  WBC 20.6* 23.4*  HGB 8.3* 6.8*  HCT 25.9* 20.4*  PLT 304 289    BMET    Recent Labs  05/29/17 2226 05/30/17 0950  NA 141 143  K 6.2* 5.3*  CL 116* 117*  CO2 18* 19*  GLUCOSE 164* 161*  BUN 73* 71*  CREATININE 1.57* 1.60*  CALCIUM 8.0* 7.8*    PT/INR  No results for input(s): LABPROT, INR in the last 72 hours.  ABG  No results for input(s): PHART, HCO3 in the last 72 hours.  Invalid input(s): PCO2, PO2   Studies/Results:  No results found.   Anti-infectives:   Anti-infectives    None      Alphonsa Overall, MD, FACS Pager: [place]  Surgery Office: [phone] 05/30/2017

## 2017-05-30 NOTE — Progress Notes (Signed)
CRITICAL VALUE ALERT  Critical Value:  Hg 6.8  Date & Time Notied: 05/29/17 0851  Provider Notified: text page  Orders Received/Actions taken: 2 units, notify surg, stat labs

## 2017-05-31 ENCOUNTER — Encounter (HOSPITAL_COMMUNITY): Payer: Self-pay | Admitting: *Deleted

## 2017-05-31 ENCOUNTER — Encounter (HOSPITAL_COMMUNITY)
Admission: EM | Disposition: A | Discharge status: Home / Self Care | Payer: Self-pay | Source: Home / Self Care | Attending: Internal Medicine

## 2017-05-31 DIAGNOSIS — D500 Iron deficiency anemia secondary to blood loss (chronic): Secondary | ICD-10-CM

## 2017-05-31 DIAGNOSIS — K269 Duodenal ulcer, unspecified as acute or chronic, without hemorrhage or perforation: Secondary | ICD-10-CM

## 2017-05-31 DIAGNOSIS — K2980 Duodenitis without bleeding: Secondary | ICD-10-CM

## 2017-05-31 DIAGNOSIS — K2970 Gastritis, unspecified, without bleeding: Secondary | ICD-10-CM

## 2017-05-31 HISTORY — PX: ESOPHAGOGASTRODUODENOSCOPY: SHX5428

## 2017-05-31 HISTORY — PX: COLONOSCOPY: SHX5424

## 2017-05-31 LAB — BPAM RBC
BLOOD PRODUCT EXPIRATION DATE: 201811162359
BLOOD PRODUCT EXPIRATION DATE: 201811162359
Blood Product Expiration Date: 201811162359
Blood Product Expiration Date: 201811192359
ISSUE DATE / TIME: 201810190846
ISSUE DATE / TIME: 201810191231
ISSUE DATE / TIME: 201810201019
ISSUE DATE / TIME: 201810201415
UNIT TYPE AND RH: 5100
UNIT TYPE AND RH: 5100
Unit Type and Rh: 5100
Unit Type and Rh: 5100

## 2017-05-31 LAB — TYPE AND SCREEN
ABO/RH(D): O POS
ANTIBODY SCREEN: NEGATIVE
UNIT DIVISION: 0
UNIT DIVISION: 0
Unit division: 0
Unit division: 0

## 2017-05-31 LAB — BASIC METABOLIC PANEL
Anion gap: 6 (ref 5–15)
BUN: 43 mg/dL — AB (ref 6–20)
CALCIUM: 7.2 mg/dL — AB (ref 8.9–10.3)
CO2: 18 mmol/L — AB (ref 22–32)
Chloride: 115 mmol/L — ABNORMAL HIGH (ref 101–111)
Creatinine, Ser: 1.34 mg/dL — ABNORMAL HIGH (ref 0.61–1.24)
GFR calc Af Amer: >60 mL/min (ref >60)
GFR, EST NON AFRICAN AMERICAN: 55 mL/min — AB (ref >60)
GLUCOSE: 120 mg/dL — AB (ref 65–99)
Potassium: 4 mmol/L (ref 3.5–5.1)
Sodium: 139 mmol/L (ref 135–145)

## 2017-05-31 LAB — GLUCOSE, CAPILLARY
Glucose-Capillary: 109 mg/dL — ABNORMAL HIGH (ref 65–99)
Glucose-Capillary: 127 mg/dL — ABNORMAL HIGH (ref 65–99)
Glucose-Capillary: 97 mg/dL (ref 65–99)

## 2017-05-31 LAB — CBC
HEMATOCRIT: 23.6 % — AB (ref 39.0–52.0)
Hemoglobin: 7.8 g/dL — ABNORMAL LOW (ref 13.0–17.0)
MCH: 29.7 pg (ref 26.0–34.0)
MCHC: 33.1 g/dL (ref 30.0–36.0)
MCV: 89.7 fL (ref 78.0–100.0)
PLATELETS: 173 10*3/uL (ref 150–400)
RBC: 2.63 MIL/uL — ABNORMAL LOW (ref 4.22–5.81)
RDW: 17.5 % — AB (ref 11.5–15.5)
WBC: 12.7 10*3/uL — ABNORMAL HIGH (ref 4.0–10.5)

## 2017-05-31 SURGERY — EGD (ESOPHAGOGASTRODUODENOSCOPY)
Anesthesia: Moderate Sedation

## 2017-05-31 MED ORDER — SODIUM CHLORIDE 0.9 % IV SOLN: INTRAVENOUS | Status: DC

## 2017-05-31 MED ORDER — FENTANYL CITRATE (PF) 100 MCG/2ML IJ SOLN
INTRAMUSCULAR | Status: DC | PRN
Start: 2017-05-31 — End: 2017-05-31
  Administered 2017-05-31 (×2): 25 ug via INTRAVENOUS

## 2017-05-31 MED ORDER — MIDAZOLAM HCL 10 MG/2ML IJ SOLN
INTRAMUSCULAR | Status: DC | PRN
  Administered 2017-05-31: 2 mg via INTRAVENOUS
  Administered 2017-05-31: 1 mg via INTRAVENOUS
  Administered 2017-05-31: 2 mg via INTRAVENOUS

## 2017-05-31 MED ORDER — MIDAZOLAM HCL 5 MG/ML IJ SOLN
INTRAMUSCULAR | Status: AC
  Filled 2017-05-31: qty 3

## 2017-05-31 MED ORDER — DIPHENHYDRAMINE HCL 50 MG/ML IJ SOLN
INTRAMUSCULAR | Status: AC
  Filled 2017-05-31: qty 1

## 2017-05-31 MED ORDER — FENTANYL CITRATE (PF) 100 MCG/2ML IJ SOLN
INTRAMUSCULAR | Status: AC
  Filled 2017-05-31: qty 4

## 2017-05-31 NOTE — Interval H&P Note (Signed)
History and Physical Interval Note:  05/31/2017 10:35 AM  Austin [name]  has presented today for surgery, with the diagnosis of gi bleeding  The various methods of treatment have been discussed with the patient and family. After consideration of risks, benefits and other options for treatment, the patient has consented to  Procedure(s): ESOPHAGOGASTRODUODENOSCOPY (EGD) (N/A) COLONOSCOPY (N/A) as a surgical intervention .  The patient's history has been reviewed, patient examined, no change in status, stable for surgery.  I have reviewed the patient's chart and labs.  Questions were answered to the patient's satisfaction.     Milus Banister

## 2017-05-31 NOTE — Progress Notes (Addendum)
PROGRESS NOTE  Austin [name] [name]:[id number] DOB: [birth year] DOA: 05/28/2017 PCP: [name], MD  HPI/Recap of past 24 hours:  Patient is seen prior to getting EGD/colonoscopy Denies abdominal pain, no sob, no fever, colostomy output now is clear nonbloody,   hgb 7.8,  potassium normalized Wife Austin bedside  Assessment/Plan: Principal Problem:   GIB (gastrointestinal bleeding) Active Problems:   Essential hypertension   Neurogenic bladder   COLONIC POLYPS, HX OF   Anemia associated with acute blood loss   Type 2 diabetes mellitus ([place])   Renal carcinoma metastatic with carcinomatosis   Hyperlipidemia   Type 2 diabetes mellitus, controlled ([place])   Perforation of sigmoid colon due to diverticulitis s/p colostomy 9/18   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 -S/p calcium gluconate -Keep on tele -normalized  AKI (h/o single kidney): metabolic acidosis  likely prerenal, ua on infection Continue hydration, sodium bicarb supplement -cr improving, good urine output   Diabetes, not on meds Austin home, a1c 5.6 Elevated blood sugar Start ssi  H/o brain stem stroke /quadriplegia/neurogenic bladder Austin base line.  Renal carcinoma metastatic with carcinomatosis- noted, not getting treatment since 2015 Slow growing per oncology note  S/P nephrectomy  Code Status: full  Family Communication: patient and wife Austin bedside  Disposition Plan: remain in  the hospital, need general surgery and gi clearance   Consultants:  General surgery  GI  Procedures:  bedside removal of 6cm black dark tissue hanging from ostomy by Dr Lucia Gaskins on 10/18, pathology pending  prbc transfusionx4units  EGD/colonoscopy on 10/21  Antibiotics:  none   Objective: BP 114/68 (BP Location: Left Arm)   Pulse 72   Temp 97.6 F (36.4 C) (Oral)   Resp 16   Ht 6' (1.829 m)   Wt 74.3 kg (163 lb 12.8 oz)   SpO2 93%   BMI 22.22 kg/m   Intake/Output Summary (Last 24 hours) Austin 05/31/17 0805 Last data filed Austin 05/31/17 0500  Gross per 24 hour  Intake              616 ml  Output             3300 ml  Net            -2684 ml   Filed Weights   05/29/17 2000 05/30/17 0600 05/31/17 0356  Weight: 69.7 kg (153 lb 10.6 oz) 70.5 kg (155 lb 6.8 oz) 74.3 kg (163 lb 12.8 oz)    Exam: Patient is examined daily including today on 05/31/2017, exams remain the same as of yesterday except that has changed    General:  NAD, aphasia, quadraplegia Austin baseline, + chronic foley,  Cardiovascular: RRR  Respiratory: CTABL  Abdomen: Soft/ND/NT, positive BS, + colostomy with clear yellow output  Musculoskeletal: No Edema  Neuro: alert, oriented , aphasia, quadraplegia Austin baseline. Austin baseline he can bear weight and help her pivot himself from bed to wheelchair and can use his right arm some.  He has sensation throughout.   Data Reviewed: Basic Metabolic Panel:  Recent Labs Lab 05/29/17 1621 05/29/17 1816 05/29/17 2226 05/30/17 0950 05/31/17 0556  NA 142 142 141 143 139  K 6.5* 6.4* 6.2* 5.3* 4.0  CL 118* 117* 116* 117* 115*  CO2 16* 17* 18* 19* 18*  GLUCOSE 157* 168* 164* 161* 120*  BUN 83* 81* 73* 71* 43*  CREATININE 1.66* 1.62* 1.57* 1.60* 1.34*  CALCIUM 7.9* 7.8* 8.0* 7.8* 7.2*   Liver Function Tests:  Recent Labs Lab 05/28/17 0759  AST 16  ALT 31  ALKPHOS 70  BILITOT 0.5  PROT 7.1  ALBUMIN 2.9*   No results for input(s): LIPASE, AMYLASE  in the last 168 hours. No results for input(s): AMMONIA in the last 168 hours. CBC:  Recent Labs Lab 05/29/17 0530 05/29/17 1621 05/30/17 0744 05/30/17 1945 05/31/17 0556  WBC 18.7* 20.6* 23.4* 17.9* 12.7*  NEUTROABS  --  16.5* 19.5*  --   --   HGB 6.4* 8.3* 6.8* 8.8* 7.8*  HCT 19.8* 25.9* 20.4* 26.7* 23.6*  MCV 84.6 87.2 89.5 89.3 89.7  PLT 390 304 289 207 173   Cardiac Enzymes:   No results for input(s): CKTOTAL, CKMB, CKMBINDEX, TROPONINI in the last 168 hours. BNP (last 3 results) No results for input(s): BNP in the last 8760 hours.  ProBNP (last 3 results) No results for input(s): PROBNP in the last 8760 hours.  CBG:  Recent Labs Lab 05/29/17 2032 05/30/17 0723 05/30/17 1151 05/30/17 1619 05/31/17 0740  GLUCAP 183* 149* 146* 110* 109*    No results found for this or any previous visit (from the past 240 hour(s)).   Studies: No results found.  Scheduled Meds: . atorvastatin  20 mg Oral Q breakfast  . insulin aspart  0-9 Units Subcutaneous TID WC  . pantoprazole (PROTONIX) IV  40 mg Intravenous Q12H  . saccharomyces boulardii  250 mg Oral BID  . sodium bicarbonate  650 mg Oral BID  . traZODone  100 mg Oral QHS    Continuous Infusions: . sodium chloride Stopped (05/29/17 0905)     Time spent: 53mins I have personally reviewed and interpreted on  05/31/2017 daily labs, tele strips, as discussed above under date review session and assessment and plans.  I reviewed all nursing notes, pharmacy notes, consultant notes,  vitals, pertinent old records  I have discussed plan of care as described above with RN , patient and family on 05/31/2017   Saher Davee MD, PhD  [facility] Pager [phone]. If 7PM-7AM, please contact night-coverage [place] [email or web address], password [email or web address] 05/31/2017, 8:05 AM  LOS: 2 days

## 2017-05-31 NOTE — Progress Notes (Addendum)
[facility] Office:  [phone] General Surgery Progress Note   LOS: 2 days  POD -     Chief Complaint: Bleeding at colostomy  Assessment and Plan: 1.  Bleeding from GI tract, probably colon  Hgb - 7.8 - 05/30/2017 (2 units PRBC 10/19, and 2 units 10/20)  Plan:  Got bowel prep.  Ostomy output clearer today - no blood.  For upper endo and colonoscopy by Dr. Ardis Hughs today.   Addendum:  With Dr. Ardis Hughs during colonoscopy.  He appears to have had an ischemic segment of his colon right at his ostomy. The ostomy has strictured down to about 7 mm.  No active bleeding.  Continue to hold Plavix.  No evidence of erosion of renal ca into right colon.  He can start clear liquids.  2. .  LAPAROSCOPIC SIGMOID COLON RESECTION WITH END COLSOTOMY, resection of metastatic carcinoma (omental met), enterolysis - 05/04/2017 - D. [place] - colon - diverticulitis with perforation, metastatic clear cell ca  3. 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 Tresa Moore, peritoneal metastasis 2015 Followed by Dr. Jerilynn Mages 4. ANTICOAGULATED (Z79.01) Impression: On plavix - but he has been off of this since 05/26/2017 5. QUADRIPLEGIA (G82.50) Story: brain stem stroke in 1993 Quadraplegic from brainstem stroke around [place] bound - his wife takes good care of him  6. C. Diff - recurrent 7. Pituitary tumor in 1994 8.  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 No abdominal pain. Wife at bedside.    Objective:   Vitals:   05/30/17 2121 05/31/17 0516  BP: (!) 107/59 114/68  Pulse: 79 72  Resp: 16 16  Temp: (!) 97.5 F (36.4 C) 97.6 F (36.4 C)  SpO2: 99% 93%     Intake/Output from previous day:  10/20 0701 - 10/21 0700 In: 616 [Blood:616] Out: 3300 [Urine:100; Stool:3200]  Intake/Output this shift:  Total I/O In: -  Out: 500 [Urine:500]   Physical Exam:   General: WN WM who is alert and oriented.   Speech pattern hard to follow because of stroke.   HEENT: Normal. Pupils equal. .   Lungs: Clear   Abdomen: Soft   Left sided ostomy - Stools/liquid from ostomy is clear.   Ostomy after being cleaned up - mucosa viable.   (05/28/2017)   Lab Results:     Recent Labs  05/30/17 1945 05/31/17 0556  WBC 17.9* 12.7*  HGB 8.8* 7.8*  HCT 26.7* 23.6*  PLT 207 173    BMET    Recent Labs  05/30/17 0950 05/31/17 0556  NA 143 139  K 5.3* 4.0  CL 117* 115*  CO2 19* 18*  GLUCOSE 161* 120*  BUN 71* 43*  CREATININE 1.60* 1.34*  CALCIUM 7.8* 7.2*    PT/INR  No results for input(s): LABPROT, INR in the last  72 hours.  ABG  No results for input(s): PHART, HCO3 in the last 72 hours.  Invalid input(s): PCO2, PO2   Studies/Results:  No results found.   Anti-infectives:   Anti-infectives    None      Alphonsa Overall, MD, FACS Pager: [phone] Surgery Office: [phone] 05/31/2017

## 2017-05-31 NOTE — Op Note (Signed)
[facility] Patient Name: Austin [name] Procedure Date: 05/31/2017 MRN: [id number] Attending MD: Milus Banister , MD Date of Birth: September 05, 1955 CSN: [id number] Age: 61 Admit Type: Inpatient Procedure:                Upper GI endoscopy Indications:              Hematochezia, Melena Providers:                Milus Banister, MD, Tory Emerald, RN, Cletis [place], Technician Referring MD:              Medicines:                None (residual sedation present from colonoscopy) Complications:            No immediate complications. Estimated blood loss:                            None. Estimated Blood Loss:     Estimated blood loss: none. Procedure:                Pre-Anesthesia Assessment:                           - Prior to the procedure, a History and Physical                            was performed, and patient medications and                            allergies were reviewed. The patient's tolerance of                            previous anesthesia was also reviewed. The risks                            and benefits of the procedure and the sedation                            options and risks were discussed with the patient.                            All questions were answered, and informed consent                            was obtained. Prior Anticoagulants: The patient has                            taken Plavix (clopidogrel), last dose was 5 days                            prior to procedure. ASA Grade Assessment: III - A  patient with severe systemic disease. After                            reviewing the risks and benefits, the patient was                            deemed in satisfactory condition to undergo the                            procedure.                           After obtaining informed consent, the endoscope was                            passed under direct vision. Throughout the                    procedure, the patient's blood pressure, pulse, and                            oxygen saturations were monitored continuously. The                            Colonoscope was introduced through the anus and                            advanced to the second part of duodenum. After                            obtaining informed consent, the endoscope was                            passed under direct vision. Throughout the                            procedure, the patient's blood pressure, pulse, and                            oxygen saturations were monitored continuously. The                            upper GI endoscopy was accomplished without                            difficulty. The patient tolerated the procedure                            well. Scope In: Scope Out: Findings:      The esophagus was normal.      Mild inflammation characterized by erythema was found in the gastric       antrum. Biopsies were taken with a cold forceps for histology.      There was moderate duodenitis in the bulb including a fairly shallow 1cm       long ulcer. This appeared benign, likely peptic.  The examination was otherwise normal. Impression:               - Moderate duodenitis including a shallow linear                            ulcer in the bulb. This ulcer MAY have caused some                            of the evident GI bleeding.                           - Mild gastritis, biopsied to check for H. pylori Moderate Sedation:      none Recommendation:           - Return patient to hospital ward for ongoing care.                           - Clear liquid diet.                           - Continue present medications. He should remain on                            twice daily PPI for two months and then it is OK to                            back down to once daily.                           - Await pathology results. If the biopsies show H.                            pylori,  will start him on appropriate antibiotics. Procedure Code(s):        --- Professional ---                           [phone], Esophagogastroduodenoscopy, flexible,                            transoral; with biopsy, single or multiple Diagnosis Code(s):        --- Professional ---                           K29.70, Gastritis, unspecified, without bleeding                           K29.80, Duodenitis without bleeding                           K92.1, Melena (includes Hematochezia) CPT copyright 2016 [employer]. All rights reserved. The codes documented in this report are preliminary and upon coder review may  be revised to meet current compliance requirements. Milus Banister, MD 05/31/2017 11:37:14 AM This report has been signed electronically. Number of Addenda: 0

## 2017-05-31 NOTE — Op Note (Signed)
[facility] Patient Name: Austin [name] Procedure Date: 05/31/2017 MRN: [id number] Attending MD: Milus Banister , MD Date of Birth: 01-21-1956 CSN: [id number] Age: 61 Admit Type: Inpatient Procedure:                Colonoscopy via Ostomy Indications:              Hematochezia Providers:                Milus Banister, MD, Elmer Ramp. Tilden Dome, RN, Cletis Athens, Technician Referring MD:             Alphonsa Overall, MD (who was present for the                            examination) Medicines:                Fentanyl 50 micrograms IV, Midazolam 5 mg IV Complications:            No immediate complications. Estimated blood loss:                            None. Estimated Blood Loss:     Estimated blood loss: none. Procedure:                Pre-Anesthesia Assessment:                           - Prior to the procedure, a History and Physical                            was performed, and patient medications and                            allergies were reviewed. The patient's tolerance of                            previous anesthesia was also reviewed. The risks                            and benefits of the procedure and the sedation                            options and risks were discussed with the patient.                            All questions were answered, and informed consent                            was obtained. Prior Anticoagulants: The patient has                            taken Plavix (clopidogrel), last dose was 5 days  prior to procedure. ASA Grade Assessment: III - A                            patient with severe systemic disease. After                            reviewing the risks and benefits, the patient was                            deemed in satisfactory condition to undergo the                            procedure.                           After obtaining informed consent, the colonoscope                       was passed under direct vision. Throughout the                            procedure, the patient's blood pressure, pulse, and                            oxygen saturations were monitored continuously. The                            EG-[id number] ([id number]) scope was introduced through the                            ostomy, and advanced to the the cecum, identified                            by appendiceal orifice and ileocecal valve. The                            colonoscopy was performed with ease. The patient                            tolerated the procedure well. The quality of the                            bowel preparation was good. The quality of the                            bowel preparation was good. The ileocecal valve,                            appendiceal orifice were photographed. Scope In: Scope Out: Findings:      The ostomy was stenotic and would not allow standard adult colonoscope       passage. I eventually used an 39mm diamter pediatric gastroscope to       advance into the colon through the ostomy, which still required some       pressure.  The most distal 5-6cm of the colon into the ostomy was very inflammed,       slightly ulcerated, friable and thickened.      There was diverticulosis in the left and right colon.      The examination was othwise normal to the cecum. Impression:               - Ostomy site is stenotic and the distal most 5-6cm                            of colon into the ostomy is inflammed, friable,                            thickened.                           - There are diverticular changes throughout the                            colon.                           - The examination is otherwise normal, no overt                            cancer invasion into the colon. Moderate Sedation:      Moderate (conscious) sedation was administered by the endoscopy nurse       and supervised by the endoscopist. The following  parameters were       monitored: oxygen saturation, heart rate, blood pressure, and response       to care. Total physician intraservice time was 15 minutes. Recommendation:           - Will proceed with EGD now.                           - Clear liquid diet.                           - Continue present medications. Procedure Code(s):        --- Professional ---                           [phone], Colonoscopy, flexible; diagnostic, including                            collection of specimen(s) by brushing or washing,                            when performed (separate procedure)                           [id number], Moderate sedation services provided by the                            same physician or other qualified health care  professional performing the diagnostic or                            therapeutic service that the sedation supports,                            requiring the presence of an independent trained                            observer to assist in the monitoring of the                            patient's level of consciousness and physiological                            status; initial 15 minutes of intraservice time,                            patient age 56 years or older Diagnosis Code(s):        --- Professional ---                           K92.1, Melena (includes Hematochezia) CPT copyright 2016 American Medical Association. All rights reserved. The codes documented in this report are preliminary and upon coder review may  be revised to meet current compliance requirements. Milus Banister, MD 05/31/2017 11:30:27 AM This report has been signed electronically. Number of Addenda: 0

## 2017-05-31 NOTE — H&P (View-Only) (Signed)
[facility] Progress Note    Since last GI note: HB down to 6.8 this morning. Getting another 2 units blood today. Persistently having dark stool, blood in ostomy.  No abd pains.  Objective: Vital signs in last 24 hours: Temp:  [97.8 F (36.6 C)-99.1 F (37.3 C)] 99 F (37.2 C) (10/20 1013) Pulse Rate:  [99-115] 105 (10/20 1013) Resp:  [16-20] 16 (10/20 1013) BP: (104-149)/(60-73) 149/73 (10/20 1013) SpO2:  [99 %-100 %] 100 % (10/20 1013) Weight:  [153 lb 10.6 oz (69.7 kg)-155 lb 6.8 oz (70.5 kg)] 155 lb 6.8 oz (70.5 kg) (10/20 0600) Last BM Date: 05/29/17 (colostomy ) General: alert and oriented times 3 Heart: regular rate and rythm Abdomen: soft, non-tender, non-distended, normal bowel sounds   Lab Results:  Recent Labs  05/29/17 0530 05/29/17 1621 05/30/17 0744  WBC 18.7* 20.6* 23.4*  HGB 6.4* 8.3* 6.8*  PLT 390 304 289  MCV 84.6 87.2 89.5    Recent Labs  05/29/17 1621 05/29/17 1816 05/29/17 2226  NA 142 142 141  K 6.5* 6.4* 6.2*  CL 118* 117* 116*  CO2 16* 17* 18*  GLUCOSE 157* 168* 164*  BUN 83* 81* 73*  CREATININE 1.66* 1.62* 1.57*  CALCIUM 7.9* 7.8* 8.0*    Recent Labs  05/28/17 0759  PROT 7.1  ALBUMIN 2.9*  AST 16  ALT 31  ALKPHOS 70  BILITOT 0.5   Studies/Results: Ct Abdomen Pelvis Wo Contrast  Result Date: 05/28/2017 CLINICAL DATA:  Abdominal pain. EXAM: CT ABDOMEN AND PELVIS WITHOUT CONTRAST TECHNIQUE: Multidetector CT imaging of the abdomen and pelvis was performed following the standard protocol without IV contrast. COMPARISON:  02/05/2017 FINDINGS: Lower chest: Stable left lower lobe lung nodule measuring 1.1 cm, image 20 of series 3. No pleural effusion. No acute abnormality. Hepatobiliary: No focal liver abnormalities. Previous cholecystectomy. No biliary dilatation. Pancreas: Unremarkable. No pancreatic ductal dilatation or surrounding inflammatory changes. Spleen: Normal in size without focal abnormality.  Adrenals/Urinary Tract: Similar appearance of bilateral adrenal nodules including 1.4 cm left adrenal gland nodule, image 29 of series 2. Hyperdense lesion arising from the posterior cortex of the inferior pole of right kidney is unchanged measuring 0.9 cm. No right-sided hydronephrosis. Surgical changes of previous left nephrectomy with region of fat necrosis in the nephrectomy bed. This is similar to previous exam. The urinary bladder appears unremarkable. Stomach/Bowel: Stomach is normal. The small bowel loops have a normal caliber without obstruction. New left lower quadrant colostomy. There is soft tissue stranding within the left lower quadrant of the abdomen at the colostomy site as well as a few small foci of extraluminal gas, image 48 of series 2. Vascular/Lymphatic: Aortic atherosclerosis. No aneurysm. No upper abdominal or pelvic adenopathy. Reproductive: Prostate is unremarkable. Other: Interval removal of left lower quadrant percutaneous drainage catheter. Multifocal peritoneal metastasis consistent with known metastatic renal cell carcinoma. The index lesion within the ventral abdomen is longer visualized and has presumably been resected. Right lower quadrant metastatic deposit measures 6.8 cm, image 54 of series 2. Previously 5.5 cm. Nodule along the undersurface of the right lobe of liver measures 2.1 cm, image 26 of series 2. Previously 1.4 cm. Musculoskeletal: No aggressive lytic or sclerotic bone lesions. IMPRESSION: 1. Since the previous exam of 02/05/2017 the patient has undergone removal of left lower quadrant percutaneous drainage catheter and placement of a left lower quadrant colostomy. The bowel loops have a normal caliber. There is no evidence for obstruction. 2. There is soft tissue stranding and a  few small foci of gas associated with the segment of bowel entering the colostomy site. Findings may reflect recent postoperative change. Inflammation/infection not excluded. No abscess  identified however. 3. Metastatic renal cell carcinoma is again noted. The dominant mass within the central abdomen has presumably been resected. Lesion within the right lower quadrant of the abdomen and lesion along the undersurface of the right lobe of liver demonstrates mild increase in size in the interval. 4.  Aortic Atherosclerosis (ICD10-I70.0). Electronically Signed   By: Kerby Moors M.D.   On: 05/28/2017 11:45     Medications: Scheduled Meds: . atorvastatin  20 mg Oral Q breakfast  . insulin aspart  0-9 Units Subcutaneous TID WC  . saccharomyces boulardii  250 mg Oral BID  . sodium bicarbonate  650 mg Oral Daily  . traZODone  100 mg Oral QHS   Continuous Infusions: . sodium chloride Stopped (05/29/17 0905)  . sodium chloride 75 mL/hr at 05/29/17 1556  . sodium chloride     PRN Meds:.acetaminophen, albuterol, ondansetron **OR** ondansetron (ZOFRAN) IV    Assessment/Plan: 61 y.o. male with GI bleeding  I'm increasingly concerned about possible UGI source.  Could be melena, and BUN/Cr are elevated vs. His usual. No risk factors for ulcer (no NSAIDs).  I am going to put him on IV PPI and will plan on EGD tomorrow morning after another 2 units today.  Last plavix 4 days ago.   Milus Banister, MD  05/30/2017, 10:27 AM [facility] Pager [phone]

## 2017-06-01 ENCOUNTER — Encounter (HOSPITAL_COMMUNITY): Payer: Self-pay | Admitting: Gastroenterology

## 2017-06-01 LAB — BASIC METABOLIC PANEL
Anion gap: 4 — ABNORMAL LOW (ref 5–15)
BUN: 30 mg/dL — AB (ref 6–20)
CHLORIDE: 115 mmol/L — AB (ref 101–111)
CO2: 21 mmol/L — ABNORMAL LOW (ref 22–32)
CREATININE: 1.21 mg/dL (ref 0.61–1.24)
Calcium: 7.4 mg/dL — ABNORMAL LOW (ref 8.9–10.3)
GFR calc Af Amer: >60 mL/min (ref >60)
GLUCOSE: 93 mg/dL (ref 65–99)
Potassium: 4 mmol/L (ref 3.5–5.1)
SODIUM: 140 mmol/L (ref 135–145)

## 2017-06-01 LAB — CBC
HCT: 22.9 % — ABNORMAL LOW (ref 39.0–52.0)
Hemoglobin: 7.4 g/dL — ABNORMAL LOW (ref 13.0–17.0)
MCH: 29.1 pg (ref 26.0–34.0)
MCHC: 32.3 g/dL (ref 30.0–36.0)
MCV: 90.2 fL (ref 78.0–100.0)
Platelets: 162 10*3/uL (ref 150–400)
RBC: 2.54 MIL/uL — ABNORMAL LOW (ref 4.22–5.81)
RDW: 17.9 % — ABNORMAL HIGH (ref 11.5–15.5)
WBC: 15.9 10*3/uL — ABNORMAL HIGH (ref 4.0–10.5)

## 2017-06-01 LAB — GLUCOSE, CAPILLARY
GLUCOSE-CAPILLARY: 97 mg/dL (ref 65–99)
Glucose-Capillary: 161 mg/dL — ABNORMAL HIGH (ref 65–99)
Glucose-Capillary: 107 mg/dL — ABNORMAL HIGH (ref 65–99)

## 2017-06-01 NOTE — Care Management Important Message (Signed)
Important Message  Patient Details  Name: Austin [name] MRN: [id number] Date of Birth: [birth year]   Medicare Important Message Given:  Yes    Kerin Salen 06/01/2017, 1:03 [place] Message  Patient Details  Name: Austin [name] MRN: [id number] Date of Birth: June 11, 1956   Medicare Important Message Given:  Yes    Kerin Salen 06/01/2017, 1:02 PM

## 2017-06-01 NOTE — Progress Notes (Signed)
PROGRESS NOTE  BOLESLAW BORGHI QIO:[id number] DOB: 1955/10/13 DOA: 05/28/2017 PCP: [name], MD  HPI/Recap of past 24 hours:  Feeling better, no more blood in colostomy bag  Denies abdominal pain, no sob, no fever,    Wife at bedside  Assessment/Plan: Principal Problem:   GIB (gastrointestinal bleeding) Active Problems:   Essential hypertension   Neurogenic bladder   COLONIC POLYPS, HX OF   Anemia associated with acute blood loss   Type 2 diabetes mellitus ([place])   Renal carcinoma metastatic with carcinomatosis   Hyperlipidemia   Type 2 diabetes mellitus, controlled ([place])   Perforation of sigmoid colon due to diverticulitis s/p colostomy 9/18   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 -S/p calcium gluconate -Keep on tele -normalized  AKI (h/o single kidney): metabolic acidosis  likely prerenal, ua on infection Continue hydration, sodium bicarb supplement -cr improving, good urine output   Diabetes, not on meds at home, a1c 5.6 Elevated blood sugar Start ssi  HTN; bp low on initial presentation, now normal -Home meds lisinopril held since admission, will not restarted due to tendency to have hyperkalemia -I have discussed low dose lopressor ( one brief episodes of possible svt 12 beats on tele on 21/22 night during sleep) Family prefer to discussed this with pmd.  H/o brain stem stroke /quadriplegia/neurogenic  bladder at base line. Off plavix for now.  Renal carcinoma metastatic with carcinomatosis- noted, not getting treatment since 2015 Slow growing per oncology note  S/P nephrectomy  Code Status: full  Family Communication: patient and wife at bedside  Disposition Plan: home in 1-2 days, need general surgery and gi clearance   Consultants:  General surgery  GI  Procedures:  bedside removal of 6cm black dark tissue hanging from ostomy by Dr Lucia Gaskins on 10/18, pathology pending  prbc transfusionx4units  EGD/colonoscopy on 10/21  Antibiotics:  none   Objective: BP (!) 115/54 (BP Location: Right Arm)   Pulse 80   Temp 98.4 F (36.9 C) (Oral)   Resp 18   Ht 6' (1.829 m)   Wt 74.5 kg (164 lb 3.9 oz)   SpO2 97%   BMI 22.28 kg/m   Intake/Output Summary (Last 24 hours) at 06/01/17 1937 Last data filed at 06/01/17 1442  Gross per 24 hour  Intake              900 ml  Output             1290 ml  Net             -390 ml   Filed Weights   05/30/17 0600 05/31/17 0356 06/01/17 0435  Weight: 70.5 kg (155 lb 6.8 oz) 74.3 kg (163 lb 12.8 oz) 74.5 kg (164 lb 3.9 oz)    Exam: Patient is examined daily including today on 06/01/2017, exams remain the same as of yesterday except that has changed    General:  NAD, aphasia, quadraplegia at  baseline, + chronic foley,  Cardiovascular: RRR  Respiratory: CTABL  Abdomen: Soft/ND/NT, positive BS, + colostomy with clear yellow output  Musculoskeletal: No Edema  Neuro: alert, oriented , aphasia, quadraplegia at baseline. At baseline he can bear weight and help her pivot himself from bed to wheelchair and can use his right arm some.  He has sensation throughout.   Data Reviewed: Basic Metabolic Panel:  Recent Labs Lab 05/29/17 1816 05/29/17 2226 05/30/17 0950 05/31/17 0556 06/01/17 0519  NA 142 141 143 139 140  K 6.4* 6.2* 5.3* 4.0 4.0  CL 117* 116* 117* 115* 115*  CO2 17* 18* 19* 18* 21*  GLUCOSE 168* 164* 161* 120*  93  BUN 81* 73* 71* 43* 30*  CREATININE 1.62* 1.57* 1.60* 1.34* 1.21  CALCIUM 7.8* 8.0* 7.8* 7.2* 7.4*   Liver Function Tests:  Recent Labs Lab 05/28/17 0759  AST 16  ALT 31  ALKPHOS 70  BILITOT 0.5  PROT 7.1  ALBUMIN 2.9*   No results for input(s): LIPASE, AMYLASE in the last 168 hours. No results for input(s): AMMONIA in the last 168 hours. CBC:  Recent Labs Lab 05/29/17 1621 05/30/17 0744 05/30/17 1945 05/31/17 0556 06/01/17 0519  WBC 20.6* 23.4* 17.9* 12.7* 15.9*  NEUTROABS 16.5* 19.5*  --   --   --   HGB 8.3* 6.8* 8.8* 7.8* 7.4*  HCT 25.9* 20.4* 26.7* 23.6* 22.9*  MCV 87.2 89.5 89.3 89.7 90.2  PLT 304 289 207 173 162   Cardiac Enzymes:   No results for input(s): CKTOTAL, CKMB, CKMBINDEX, TROPONINI in the last 168 hours. BNP (last 3 results) No results for input(s): BNP in the last 8760 hours.  ProBNP (last 3 results) No results for input(s): PROBNP in the last 8760 hours.  CBG:  Recent Labs Lab 05/31/17 1655 05/31/17 2058 06/01/17 0736 06/01/17 1144 06/01/17 1654  GLUCAP 127* 97 97 161* 107*    No results found for this or any previous visit (from the past 240 hour(s)).   Studies: No results found.  Scheduled Meds: . atorvastatin  20 mg Oral Q breakfast  . insulin aspart  0-9 Units Subcutaneous TID WC  . pantoprazole (PROTONIX) IV  40 mg Intravenous Q12H  . saccharomyces boulardii  250 mg Oral BID  . sodium bicarbonate  650 mg Oral BID  . traZODone  100 mg Oral QHS    Continuous Infusions: . sodium chloride       Time spent: 28mins I have personally reviewed and interpreted on  06/01/2017 daily labs, tele strips, as discussed above under date review session and assessment and plans.  I reviewed all nursing notes, pharmacy notes, consultant notes,  vitals, pertinent old records  I have discussed plan of care as described above with RN , patient and family on 06/01/2017   Awa Bachicha MD, PhD  [facility] Pager [phone]. If  7PM-7AM, please contact night-coverage at [email or web address], password [facility] 06/01/2017, 7:37 PM  LOS: 3 days

## 2017-06-01 NOTE — Progress Notes (Signed)
[facility] Office:  [phone] General Surgery Progress Note   LOS: 3 days  POD -  1 Day Post-Op  Chief Complaint: Bleeding at colostomy  Assessment and Plan: 1.  Bleeding from GI tract,   Unclear whether primary source is  from the irregular post op changes of  colon at the colostomy or the duodenal ulcer - elevated BUN of 80 on 05/29/2017 would argue that the primary source is upper GI - His BUN has come down fast since the bleeding is stopped  Hgb - 7.4 - 05/31/2017 (2 units PRBC 10/19, and 2 units 10/20)  Bleeding appears controlled  Advance diet.    1A.  Duodenal ulcer on upper endoscopy by Dr. Ardis Hughs.  2. .  LAPAROSCOPIC SIGMOID COLON RESECTION WITH END COLSOTOMY, resection of metastatic carcinoma (omental met), enterolysis - 05/04/2017 - D. Quinlan - colon - diverticulitis with perforation, metastatic clear cell ca  3. 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 Tresa Moore, peritoneal metastasis 2015 Followed by Dr. Jerilynn Mages 4. ANTICOAGULATED (Z79.01) Impression: On plavix - but he has been off of this since 05/26/2017 5. QUADRIPLEGIA (G82.50) Story: brain stem stroke in 1993 Quadraplegic from brainstem stroke around [place] - his wife takes good care of him  6. C. Diff - recurrent 7. Pituitary tumor in 1994 8.  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 No abdominal pain.  Stools without blood. Wife at bedside.    Objective:   Vitals:   05/31/17 2101 06/01/17 0434  BP: (!) 102/54 116/61  Pulse: 78 69  Resp:  20  Temp: 98.4 F (36.9 C) 97.7 F (36.5 C)  SpO2: 94% 95%     Intake/Output from previous day:  10/21 0701 - 10/22 0700 In: 420 [P.O.:180; I.V.:240] Out: 1690 [Urine:1600; Stool:90]  Intake/Output this shift:  No intake/output data recorded.   Physical Exam:   General: WN WM who is alert and oriented.   Speech pattern hard to follow because of stroke.   HEENT: Normal. Pupils equal. .   Lungs: Clear   Abdomen: Soft   Left sided ostomy - Stools without blood.   Ostomy after being cleaned up - mucosa viable.   (05/28/2017)   Lab Results:     Recent Labs  05/31/17 0556 06/01/17 0519  WBC 12.7* 15.9*  HGB 7.8* 7.4*  HCT 23.6* 22.9*  PLT 173 162    BMET    Recent Labs  05/31/17 0556 06/01/17 0519  NA 139 140  K 4.0 4.0  CL 115* 115*  CO2 18* 21*  GLUCOSE 120* 93  BUN 43* 30*  CREATININE 1.34* 1.21  CALCIUM 7.2* 7.4*    PT/INR  No results for input(s): LABPROT, INR in the last 72 hours.  ABG  No results for input(s): PHART, HCO3  in the last 72 hours.  Invalid input(s): PCO2, PO2   Studies/Results:  No results found.   Anti-infectives:   Anti-infectives    None      Alphonsa Overall, MD, FACS Pager: [phone] Surgery Office: [phone] 06/01/2017

## 2017-06-02 DIAGNOSIS — K5720 Diverticulitis of large intestine with perforation and abscess without bleeding: Secondary | ICD-10-CM

## 2017-06-02 LAB — BASIC METABOLIC PANEL
ANION GAP: 6 (ref 5–15)
BUN: 25 mg/dL — AB (ref 6–20)
CALCIUM: 7.5 mg/dL — AB (ref 8.9–10.3)
CO2: 21 mmol/L — ABNORMAL LOW (ref 22–32)
Chloride: 112 mmol/L — ABNORMAL HIGH (ref 101–111)
Creatinine, Ser: 1.06 mg/dL (ref 0.61–1.24)
GFR calc Af Amer: >60 mL/min (ref >60)
Glucose, Bld: 114 mg/dL — ABNORMAL HIGH (ref 65–99)
POTASSIUM: 3.7 mmol/L (ref 3.5–5.1)
SODIUM: 139 mmol/L (ref 135–145)

## 2017-06-02 LAB — CBC
HCT: 22.4 % — ABNORMAL LOW (ref 39.0–52.0)
Hemoglobin: 7.2 g/dL — ABNORMAL LOW (ref 13.0–17.0)
MCH: 28.9 pg (ref 26.0–34.0)
MCHC: 32.1 g/dL (ref 30.0–36.0)
MCV: 90 fL (ref 78.0–100.0)
PLATELETS: 196 10*3/uL (ref 150–400)
RBC: 2.49 MIL/uL — AB (ref 4.22–5.81)
RDW: 17.6 % — AB (ref 11.5–15.5)
WBC: 13.7 10*3/uL — AB (ref 4.0–10.5)

## 2017-06-02 LAB — GLUCOSE, CAPILLARY
GLUCOSE-CAPILLARY: 109 mg/dL — AB (ref 65–99)
GLUCOSE-CAPILLARY: 165 mg/dL — AB (ref 65–99)
Glucose-Capillary: 138 mg/dL — ABNORMAL HIGH (ref 65–99)
Glucose-Capillary: 107 mg/dL — ABNORMAL HIGH (ref 65–99)

## 2017-06-02 LAB — MAGNESIUM: MAGNESIUM: 2 mg/dL (ref 1.7–2.4)

## 2017-06-02 MED ORDER — ACETAMINOPHEN 500 MG PO TABS: 500.0000 mg | ORAL_TABLET | Freq: Four times a day (QID) | ORAL | 0 refills | Status: DC | PRN

## 2017-06-02 MED ORDER — PANTOPRAZOLE SODIUM 40 MG PO TBEC: 40.0000 mg | DELAYED_RELEASE_TABLET | Freq: Two times a day (BID) | ORAL | 1 refills | Status: DC

## 2017-06-02 MED ORDER — SODIUM BICARBONATE 650 MG PO TABS: 650.0000 mg | ORAL_TABLET | Freq: Every day | ORAL | 0 refills | Status: DC

## 2017-06-02 MED ORDER — METOPROLOL TARTRATE 25 MG PO TABS: 12.5000 mg | ORAL_TABLET | Freq: Two times a day (BID) | ORAL | 0 refills | Status: DC

## 2017-06-02 NOTE — Care Management Note (Signed)
Case Management Note  Patient Details  Name: Austin [name] MRN: [id number] Date of Birth: 1956-05-13  Subjective/Objective: 61 y/o m admitted w/GIB. From home. D/c plan home. Noted [facility] will follow post d/c. No CM needs or orders.                   Action/Plan:d/c home.   Expected Discharge Date:  06/02/17               Expected Discharge Plan:  Home/Self Care  In-House Referral:     Discharge planning Services  CM Consult  Post Acute Care Choice:    Choice offered to:     DME Arranged:    DME Agency:     HH Arranged:    HH Agency:     Status of Service:  Completed, signed off  If discussed at [employer] of Stay Meetings, dates discussed:    Additional Comments:  Dessa Phi, RN 06/02/2017, 2:34 PM

## 2017-06-02 NOTE — Progress Notes (Addendum)
[facility]:  [phone] General Surgery Progress Note   LOS: 4 days  POD -  2 Days Post-Op  Chief Complaint: Bleeding at colostomy  Assessment and Plan: 1.  Bleeding from GI tract - probably secondary to duodenal ulcer  Somewhat unclear whether GI bleed is from the irregular post op changes of colon at the colostomy or the duodenal ulcer - elevated BUN of 80 on 05/29/2017 would argue that the primary source is upper GI (duodenal ulcer) - His BUN has come down fast since the bleeding is stopped  Hgb - 7.2 - 06/01/2017 (2 units PRBC 10/19, and 2 units 10/20)  Tolerated regular food.  Colostomy with brown stool  He can go home from my standpoint.  I will see him back in the office in 6 weeks.  I will assume Dr. Hilarie Fredrickson will follow up the ulcer disease.  1A.  Duodenal ulcer on upper endoscopy by Dr. Ardis Hughs.  BUN now down to 25  On protonix  2. .  LAPAROSCOPIC SIGMOID COLON RESECTION WITH END COLSOTOMY, resection of metastatic carcinoma (omental met), enterolysis - 05/04/2017 - [facility] [place] - colon - diverticulitis with perforation, metastatic clear cell ca 3. 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 Tresa Moore, peritoneal metastasis 2015 Followed by Dr. Jerilynn Mages 4. ANTICOAGULATED (Z79.01) Impression: On plavix - but he has been off of this since 05/26/2017 5. 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 C. Diff - recurrent 7. Pituitary tumor in 1994 8.  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  Eating. Stools without blood.  He says he feels better than he has since surgery. Wife at bedside.    Objective:   Vitals:   06/01/17 2024 06/02/17 0506  BP: (!) 95/46 (!) 114/47  Pulse: 75 74  Resp: 18 20  Temp: 98.2 F (36.8 C) 99.3 F (37.4 C)  SpO2: 97% 95%     Intake/Output from previous day:  10/22 0701 - 10/23 0700 In: 640 [P.O.:480; I.V.:160] Out: 1100 [Urine:1000; Stool:100]  Intake/Output this shift:  No intake/output data recorded.   Physical Exam:   General: WN WM who is alert and oriented.   Speech pattern hard to follow because of stroke.   HEENT: Normal. Pupils equal. .   Lungs: Clear   Abdomen: Soft   Left sided ostomy - Stools without blood.   Ostomy after being cleaned up - mucosa viable.   (05/28/2017)   Lab Results:     Recent Labs  06/01/17 0519 06/02/17 0413  WBC 15.9* 13.7*  HGB 7.4* 7.2*  HCT 22.9* 22.4*  PLT 162 196    BMET    Recent Labs  06/01/17 0519 06/02/17 0413  NA 140  139  K 4.0 3.7  CL 115* 112*  CO2 21* 21*  GLUCOSE 93 114*  BUN 30* 25*  CREATININE 1.21 1.06  CALCIUM 7.4* 7.5*    PT/INR  No results for input(s): LABPROT, INR in the last 72 hours.  ABG  No results for input(s): PHART, HCO3 in the last 72 hours.  Invalid input(s): PCO2, PO2   Studies/Results:  No results found.   Anti-infectives:   Anti-infectives    None      Alphonsa Overall, MD, FACS Pager: [phone] Surgery Office: [phone] 06/02/2017

## 2017-06-02 NOTE — Discharge Summary (Addendum)
Discharge Summary  Austin [name] IOE:[id number] DOB: 19-Oct-1955  PCP: [name], MD  Admit date: 05/28/2017 Discharge date: 06/02/2017  Time spent: >57mins, more than 50% time spent on coordination of care  Recommendations for Outpatient Follow-up:  1. F/u with PMD within a week  for hospital discharge follow up, repeat cbc/bmp at follow up 2. F/u with general surgery in 6weeks, hold plavix until ok with general surgery. 3. F/u with [facility]  in 47months  Discharge Diagnoses:  Active Hospital Problems   Diagnosis Date Noted  . GIB (gastrointestinal bleeding) 05/28/2017  . Duodenal ulcer   . Colostomy in place  05/30/2017  . Blood loss anemia 05/29/2017  . S/p nephrectomy 05/28/2017  . 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 Perforation of sigmoid colon due to diverticulitis s/p colostomy 9/18 09/27/2016  . Type 2 diabetes mellitus, controlled ([place]) 06/01/2014  . Hyperlipidemia 10/17/2013  . Renal carcinoma metastatic with carcinomatosis 11/09/2012  . Type 2 diabetes mellitus ([place]) 03/31/2012  . Anemia associated with acute blood loss 02/28/2012  . COLONIC POLYPS, HX OF 10/16/2008  . Essential hypertension 10/16/2008  . Neurogenic bladder 10/16/2008    Resolved Hospital Problems   Diagnosis Date Noted Date Resolved  No resolved problems to display.    Discharge Condition: stable  Diet recommendation: heart healthy/carb modified  Filed Weights   05/31/17 0356 06/01/17 0435 06/02/17 0500  Weight: 74.3 kg (163 lb 12.8 oz) 74.5 kg (164 lb 3.9 oz) 76.2 kg (167 lb 15.9 oz)    History of present illness:   PCP: [name], MD  Patient coming from:  home  Chief Complaint:   Blood in ostomy bag  HPI: Austin [name] is a 61 y.o. male with medical history significant of quadraplegia since age 73 from brainstem stroke, renal cell carcinoma s/p nephrectomy and no other treatment since 1829,  Recent complications from acute diverticulitis s/p  colostomy 9/18, polypectomy comes in with one day of maroon, bright red blood in his ostomy bag.  He vomited once this am which was bilious and nonbloody.  Pt is minimally verbal and communicates mostly via his wife who takes very good care of him at home.  At baseline he can bear weight and help her pivot himself from bed to wheelchair and can use his right arm some.  He has feeling throughout.  There has been no fevers.  No cough.  He says he has minimal abdominal pain which is generalized.  He had a colonoscopy in 2013 and had many polyps removed, he has had no follow up scope due to his other medical issues.  His wife reports that she has emptied less than 1/2 of his bag since last night.  There has been minimal output since arrival to ED.  Pt referred for admission for bleeding from GI tract.  He is on plavix.  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  Biopsy for h pylori pending. -colonoscopy "Ostomy site is stenotic and the distal most 5-6cm of colon into the ostomy is inflammed, friable, thickened."( detail please refer to original report) -bleed stopped, ostomy output brown at discharge, hgb stable, he tolerated diet advancement  -he is  cleared to discharge from the hospital from GI and general surgery.  -patient will follow up with general surgery and gi to decided on when to resume plavix. He is discharged on ppi bid.   Hyperkalemia:  -potassium peaked at 6.5 -shift with bicarb and iv insulin, not a candidate for lasix due to dehydration and aki, not a candidate for resin binder due to gi bleed. -S/p calcium gluconate -normalized  AKI (h/o single kidney): metabolic acidosis  -likely prerenal, ua on infection He recieived blood transfusion and  hydration,  He is started on sodium bicarb supplement -bun/cr peaked at 80/1.7, bun/cr 25/1.06 at discharge.   Diabetes, not on meds at home, a1c 5.6 Elevated blood sugar ssi in the hospital Carb modified diet, pmd follow up  HTN;  -bp low on initial presentation, now normal -Home meds lisinopril held since admission, will not restarted due to tendency to have hyperkalemia  -two brief episodes of possible svt /afib 12 beats on tele night during sleep -Started on low dose lopressor, patient to discuss with pmd about outpatient heart monitoring and sleep study.   H/o brain stem stroke /quadriplegia/neurogenic bladder at base line. Off plavix for now. Gi/general surgery to decide when to resume.  Renal carcinoma metastatic with carcinomatosis- noted, not getting treatment since 2015 Slow growing per oncology note  S/P nephrectomy  Code Status: full  Family Communication: patient and wife at bedside  Disposition Plan: home with general surgery and gi clearance   Consultants:  General surgery  GI  Procedures:  bedside removal of 6cm black dark tissue hanging from ostomy by Dr Lucia Gaskins on 10/18, pathology pending  prbc transfusionx4units  EGD/colonoscopy on 10/21  Antibiotics:  none   Discharge Exam: BP (!) 144/56 (BP Location: Right Arm)   Pulse 81   Temp 98 F (36.7 C) (Oral)   Resp 19   Ht 6' (1.829 m)   Wt 76.2 kg (167 lb  15.9 oz)   SpO2 98%   BMI 22.78 kg/m    General:  NAD, aphasia, quadraplegia at baseline, + chronic foley,  Cardiovascular: RRR  Respiratory: CTABL  Abdomen: Soft/ND/NT, positive BS, + colostomy with clear yellow output  Musculoskeletal: No Edema  Neuro: alert, oriented , aphasia, quadraplegia at baseline. At baseline he can bear weight and help her pivot himself from bed to wheelchair and can use his right arm some. He has sensation throughout.     Discharge Instructions You were cared for by a hospitalist during your hospital stay. If you have any questions about your discharge medications or the care you received while you were in the hospital after you are discharged, you can call the unit and asked to speak with the hospitalist on call if the hospitalist that took care of you is not available. Once you are discharged, your primary care physician will handle any further medical issues. Please note that NO REFILLS for any discharge medications will be authorized once you are discharged, as it is imperative that you return to your primary care  physician (or establish a relationship with a primary care physician if you do not have one) for your aftercare needs so that they can reassess your need for medications and monitor your lab values.  Discharge Instructions    AMB Referral to [facility] Management    Complete by:  As directed    Please assign Telephonic RNCM for transition of care. Has had multiple hospital readmissions. Only agreeable to telephonic calls. Active written consent remains on file. Currently at [facility]. Wife Kentarius Partington to be contacted for discharge calls at [phone]. Thanks. Marthenia Rolling, [place], RN,BSN-[facility]N [facility] INOMVEH-[phone]   Reason for consult:  Please assign to Telephonic [facility]   Expected date of contact:  1-3 days (reserved for hospital discharges)   Diet - low sodium heart healthy    Complete by:  As directed    Carb modified  diet   Increase activity slowly    Complete by:  As directed      Allergies as of 06/02/2017      Reactions   Penicillins Rash   Has patient had a PCN reaction causing immediate rash, facial/tongue/throat swelling, SOB or lightheadedness with hypotension: No Has patient had a PCN reaction causing severe rash involving mucus membranes or skin necrosis: No Has patient had a PCN reaction that required hospitalization No Has patient had a PCN reaction occurring within the last 10 years: Yes  If all of the above answers are "NO", then may proceed with Cephalosporin use.      Medication List    STOP taking these medications   clopidogrel 75 MG tablet Commonly known as:  PLAVIX   levofloxacin 500 MG tablet Commonly known as:  LEVAQUIN   lisinopril-hydrochlorothiazide 20-12.5 MG tablet Commonly known as:  PRINZIDE,ZESTORETIC     TAKE these medications   acetaminophen 500 MG tablet Commonly known as:  TYLENOL Take 1 tablet (500 mg total) by mouth every 6 (six) hours as needed for mild pain, moderate pain, fever or headache. What changed:  how much to take   albuterol 108 (90 Base) MCG/ACT inhaler Commonly known as:  PROVENTIL HFA;VENTOLIN HFA Inhale 2 puffs into the lungs every 6 (six) hours as needed for wheezing or shortness of breath.   atorvastatin 20 MG tablet Commonly known as:  LIPITOR Take 20 mg by mouth daily with breakfast.   cetirizine 10 MG tablet Commonly known as:  ZYRTEC Take 10 mg by mouth daily with breakfast.   escitalopram 10 MG tablet Commonly known as:  LEXAPRO TAKE ONE TABLET BY MOUTH ONCE DAILY IN THE MORNING   ketoconazole 2 % cream Commonly known as:  NIZORAL APPLY AS NEEDED TO RASH What changed:  See the new instructions.   Melatonin 3 MG Tabs Take 3 mg by mouth at bedtime as needed (for sleep).   metoprolol tartrate 25 MG tablet Commonly known as:  LOPRESSOR Take 0.5 tablets (12.5 mg total) by mouth 2 (two) times daily. Hold if sbp less  than 90 or heart rate less than 60.   multivitamin with minerals Tabs tablet Take 1 tablet by mouth daily with breakfast.   pantoprazole 40 MG tablet Commonly known as:  PROTONIX Take 1 tablet (40 mg total) by mouth 2 (two) times daily.   saccharomyces boulardii 250 MG capsule Commonly known as:  FLORASTOR Take 1 capsule (250 mg total) by mouth 2 (two) times daily.   sodium bicarbonate 650 MG tablet Take 1 tablet (650 mg total) by mouth daily.  testosterone enanthate 200 MG/ML injection Commonly known as:  DELATESTRYL Inject 200 mg into the muscle every 14 (fourteen) days. For IM use only   traMADol 50 MG tablet Commonly known as:  ULTRAM Take 50 mg by mouth every 6 (six) hours as needed for moderate pain.   traZODone 100 MG tablet Commonly known as:  DESYREL TAKE ONE TABLET BY MOUTH AT BEDTIME   triamcinolone cream 0.1 % Commonly known as:  KENALOG Apply 1 application topically 2 (two) times daily as needed (for rash). Applies to face      Allergies  Allergen Reactions  . Penicillins Rash    Has patient had a PCN reaction causing immediate rash, facial/tongue/throat swelling, SOB or lightheadedness with hypotension: No Has patient had a PCN reaction causing severe rash involving mucus membranes or skin necrosis: No Has patient had a PCN reaction that required hospitalization No Has patient had a PCN reaction occurring within the last 10 years: Yes  If all of the above answers are "NO", then may proceed with Cephalosporin use.   Follow-up Information    [name], MD Follow up in 1 week(s).   Specialty:  Family Medicine Why:  hospital discharge follow up, repeat cbc/bmp at follow up. continue adjust blood pressure meds. consider sleep study. Contact information: [place] [address] [phone]        Alphonsa Overall, MD Follow up in 6 week(s).   Specialty:  General Surgery Why:  hold plavix until ok with general  surgery. Contact information: [address] [place] [address] [phone]        Milus Banister, MD Follow up in 2 month(s).   Specialty:  Gastroenterology Contact information: [address] [place] [address] [phone]            The results of significant diagnostics from this hospitalization (including imaging, microbiology, ancillary and laboratory) are listed below for reference.    Significant Diagnostic Studies: Ct Abdomen Pelvis Wo Contrast  Result Date: 05/28/2017 CLINICAL DATA:  Abdominal pain. EXAM: CT ABDOMEN AND PELVIS WITHOUT CONTRAST TECHNIQUE: Multidetector CT imaging of the abdomen and pelvis was performed following the standard protocol without IV contrast. COMPARISON:  02/05/2017 FINDINGS: Lower chest: Stable left lower lobe lung nodule measuring 1.1 cm, image 20 of series 3. No pleural effusion. No acute abnormality. Hepatobiliary: No focal liver abnormalities. Previous cholecystectomy. No biliary dilatation. Pancreas: Unremarkable. No pancreatic ductal dilatation or surrounding inflammatory changes. Spleen: Normal in size without focal abnormality. Adrenals/Urinary Tract: Similar appearance of bilateral adrenal nodules including 1.4 cm left adrenal gland nodule, image 29 of series 2. Hyperdense lesion arising from the posterior cortex of the inferior pole of right kidney is unchanged measuring 0.9 cm. No right-sided hydronephrosis. Surgical changes of previous left nephrectomy with region of fat necrosis in the nephrectomy bed. This is similar to previous exam. The urinary bladder appears unremarkable. Stomach/Bowel: Stomach is normal. The small bowel loops have a normal caliber without obstruction. New left lower quadrant colostomy. There is soft tissue stranding within the left lower quadrant of the abdomen at the colostomy site as well as a few small foci of extraluminal gas, image 48 of series 2. Vascular/Lymphatic: Aortic  atherosclerosis. No aneurysm. No upper abdominal or pelvic adenopathy. Reproductive: Prostate is unremarkable. Other: Interval removal of left lower quadrant percutaneous drainage catheter. Multifocal peritoneal metastasis consistent with known metastatic renal cell carcinoma. The index lesion within the ventral abdomen is longer visualized and has  presumably been resected. Right lower quadrant metastatic deposit measures 6.8 cm, image 54 of series 2. Previously 5.5 cm. Nodule along the undersurface of the right lobe of liver measures 2.1 cm, image 26 of series 2. Previously 1.4 cm. Musculoskeletal: No aggressive lytic or sclerotic bone lesions. IMPRESSION: 1. Since the previous exam of 02/05/2017 the patient has undergone removal of left lower quadrant percutaneous drainage catheter and placement of a left lower quadrant colostomy. The bowel loops have a normal caliber. There is no evidence for obstruction. 2. There is soft tissue stranding and a few small foci of gas associated with the segment of bowel entering the colostomy site. Findings may reflect recent postoperative change. Inflammation/infection not excluded. No abscess identified however. 3. Metastatic renal cell carcinoma is again noted. The dominant mass within the central abdomen has presumably been resected. Lesion within the right lower quadrant of the abdomen and lesion along the undersurface of the right lobe of liver demonstrates mild increase in size in the interval. 4.  Aortic Atherosclerosis (ICD10-I70.0). Electronically Signed   By: Kerby Moors M.D.   On: 05/28/2017 11:45   Dg Chest 1 View  Result Date: 05/13/2017 CLINICAL DATA:  Fever and cough EXAM: CHEST 1 VIEW COMPARISON:  Chest radiograph November 18, 2013; chest CT April 23, 2015 FINDINGS: There is mild scarring in the base regions. There is no appreciable edema or consolidation. Heart size and pulmonary vascularity are normal. No evident adenopathy. There is aortic  atherosclerosis. No bone lesions evident. IMPRESSION: Scarring in the bases. No edema or consolidation. Stable cardiac silhouette. There is aortic atherosclerosis. Aortic Atherosclerosis (ICD10-I70.0). Electronically Signed   By: Lowella Grip III M.D.   On: 05/13/2017 12:20    Microbiology: No results found for this or any previous visit (from the past 240 hour(s)).   Labs: Basic Metabolic Panel:  Recent Labs Lab 05/29/17 2226 05/30/17 0950 05/31/17 0556 06/01/17 0519 06/02/17 0413  NA 141 143 139 140 139  K 6.2* 5.3* 4.0 4.0 3.7  CL 116* 117* 115* 115* 112*  CO2 18* 19* 18* 21* 21*  GLUCOSE 164* 161* 120* 93 114*  BUN 73* 71* 43* 30* 25*  CREATININE 1.57* 1.60* 1.34* 1.21 1.06  CALCIUM 8.0* 7.8* 7.2* 7.4* 7.5*  MG  --   --   --   --  2.0   Liver Function Tests:  Recent Labs Lab 05/28/17 0759  AST 16  ALT 31  ALKPHOS 70  BILITOT 0.5  PROT 7.1  ALBUMIN 2.9*   No results for input(s): LIPASE, AMYLASE in the last 168 hours. No results for input(s): AMMONIA in the last 168 hours. CBC:  Recent Labs Lab 05/29/17 1621 05/30/17 0744 05/30/17 1945 05/31/17 0556 06/01/17 0519 06/02/17 0413  WBC 20.6* 23.4* 17.9* 12.7* 15.9* 13.7*  NEUTROABS 16.5* 19.5*  --   --   --   --   HGB 8.3* 6.8* 8.8* 7.8* 7.4* 7.2*  HCT 25.9* 20.4* 26.7* 23.6* 22.9* 22.4*  MCV 87.2 89.5 89.3 89.7 90.2 90.0  PLT 304 289 207 173 162 196   Cardiac Enzymes: No results for input(s): CKTOTAL, CKMB, CKMBINDEX, TROPONINI in the last 168 hours. BNP: BNP (last 3 results) No results for input(s): BNP in the last 8760 hours.  ProBNP (last 3 results) No results for input(s): PROBNP in the last 8760 hours.  CBG:  Recent Labs Lab 06/01/17 1144 06/01/17 1654 06/01/17 2222 06/02/17 0722 06/02/17 1142  GLUCAP 161* 107* 138* 109* 165*  SignedFlorencia Reasons MD, PhD  [facility] 06/02/2017, 1:30 PM

## 2017-06-02 NOTE — Consult Note (Signed)
   [facility] Inpatient Consult   06/02/2017  KAJUAN GUYTON 1955-12-10 [id number]     Mr. Henery screened for potential [facility] Care Management services due to recent readmission. He was also followed by [facility] Management in the past.   Spoke with Mr. Barga and wife at bedside to discuss re-engagement with [facility] Management services.   Both Mr. Vandiver and wife are agreeable to [facility] Management services but only prefer telephonic calls not home visits. Active [facility] Care Management consent remains on file.   Confirms Primary Care MD is Dr. [name]. Notification sent to contact person, Vita Barley, at [employer] [place] to make aware [facility] Management to follow telephonically due to recent readmission and [employer].   Mrs. Pileggi to be contacted for post discharge calls at [phone].  Will make referral for Telephonic RNCM follow up for transition of care.   Will make inpatient RNCM aware that [facility] Management to follow post discharge.   Marthenia Rolling, MSN-Ed, RN,BSN [facility] Liaison [phone]

## 2017-06-02 NOTE — Consult Note (Signed)
[facility] Nurse ostomy consult note Stoma type/location: LUQ colostomy Stomal assessment/size: 1 inch round, slightly oval at rest. Recessed below skin level. Peristomal assessment: intact, with mild erythema in the immediate periwound skin Treatment options for stomal/peristomal skin: convex pouch and skin barrier ring Output: brown soft stool  Ostomy pouching: 2pc. convex 1 and 1/8 inch skin barrier and 2 and 1/4 inch pouch with skin barrier ring Education provided: Patient and wife taught about different pouching option for stoma at this point; 1-piece precut convex is their preference due to spacticity in upper left arm. Patient is concerned that he will pull off the pouch of the 2-piece system. Stoma dilation discussed; Dr. Lucia Gaskins had mentioned to him and they had read on line about the pros and cons. The belt is demonstrated.   Enrolled patient in [place] program: Yes.  New supplies (1-piece convex, precut pouches ordered, also another belt) ordered to the home. Flat supplies with 2 rings [provided to bedside. [facility] nursing team will follow, and will remain available to this patient, the nursing and medical teams. Thanks, Maudie Flakes, MSN, RN, [place], Arther Abbott  Pager# [phone]

## 2017-06-02 NOTE — Progress Notes (Signed)
   GI Plan ( per Dr. Ardis Hughs):  1. Continue PPI 40mg  BID for 2 months 2. Needs follow up in our clinic in 2 months, at that time will discuss titration down to once daily PPI (Our office will call to arrange) 3. Pt needs to keep follow up with Dr. Lucia Gaskins regarding ulcerated ostomy (it appears in 6 wks per his note) 4. Patient may resume Plavix in 4 days   Thank you for your kind consultation, please let me know if you have any further questions.   Lavone Nian Aleiyah Halpin  06/02/2017, 10:40 AM  Pager # [phone]

## 2017-06-02 NOTE — Progress Notes (Signed)
Patient is alert oriented x4, no change from baseline. Discharge instructions reviewed with pt/spouse. Questions, concerns denied.

## 2017-06-03 ENCOUNTER — Telehealth: Payer: Self-pay | Admitting: *Deleted

## 2017-06-03 ENCOUNTER — Other Ambulatory Visit: Payer: Self-pay

## 2017-06-03 NOTE — Patient Outreach (Signed)
[name]) Care Management  06/03/2017  Austin [name] 14-Jul-1956 [id number]   Referral Date: 06/02/2017 Referral Source: Hospital liaison Date of Admission: 05/28/2017 Diagnosis: GI bleed Date of Discharge: 06/02/2017 Facility: [facility]   Outreach attempt #1 Spoke with wife/pateint she reports that patient is doing ok since being home.  She reports some weakness but otherwise ok. She reports patient was admitted with GI bleed and was found to have an ulcer. Patient taking protonix and sodium bicarbonate for it per wife.  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  Appointments: Patient has appointment with primary care physician on 06-05-17.    Consent: RN CM reviewed [facility] services with patient/caregiver. Patient/caregiver gave verbal consent for services.  Written consent in chart and wife reports that they have other information on [facility].    Plan: RN CM will send physician barriers letter and other assessment information.   RN CM will contact caregiver/patient next week for weekly call and caregiver/patient agrees to next outreach.     [name], RN, MSN [facility] Care Management Care Management Coordinator Direct Line [phone] Toll Free: [phone]  Fax: [phone]

## 2017-06-03 NOTE — Telephone Encounter (Signed)
Transition Care Management Follow-up Telephone Call  Per Discharge Summary: Admit date: 05/28/2017 Discharge date: 06/02/2017  Recommendations for Outpatient Follow-up:  1. F/u with PMD within a week  for hospital discharge follow up, repeat cbc/bmp at follow up 2. F/u with general surgery in 6weeks, hold plavix until ok with general surgery. 3. F/u with [facility]  in 2 months  -- Call completed w/ patient's wife Peter Congo), on [place].   How have you been since you were released from the hospital? "Well we've only been home less than 24 hours. He's still a little achy and his arms are very puffy.  He didn't have an infiltrated IV but I think because he sat in the bed the whole time it just kind of pooled in his hands and arms." Patient reports he feels better than when he went in.   Do you understand why you were in the hospital? yes   Do you understand the discharge instructions? yes   Where were you discharged to? Home   Items Reviewed:  Medications reviewed: yes  Allergies reviewed: yes  Dietary changes reviewed: yes, heart healthy, low carb  Referrals reviewed: no, none made   Functional Questionnaire:   Activities of Daily Living (ADLs):   He states they are independent in the following: feeding and grooming States they require assistance with the following: ambulation, bathing and hygiene, continence, toileting and dressing. Patient is quadriplegic. Wife assists with ADLs.   Any transportation issues/concerns?: no   Any patient concerns? yes, patient's lisinopril/HCTZ was dc'd and metoprolol ordered. Patient would like to discuss this change with Dr. [name] prior to starting metoprolol. Would also like to discuss if/when he should restart Plavix (per notes he will hold until ok surgery).    Confirmed importance and date/time of follow-up visits scheduled yes  Provider Appointment booked with Dr. Carolann Littler 06/05/17 @ 3:45pm.  Confirmed with patient if  condition begins to worsen call PCP or go to the ER.  Patient was given the office number and encouraged to call back with question or concerns.  : yes

## 2017-06-05 ENCOUNTER — Ambulatory Visit (INDEPENDENT_AMBULATORY_CARE_PROVIDER_SITE_OTHER): Payer: PPO | Admitting: Family Medicine

## 2017-06-05 ENCOUNTER — Encounter: Payer: Self-pay | Admitting: Family Medicine

## 2017-06-05 VITALS — BP 122/72 | HR 89 | Temp 99.1°F | Ht 72.0 in

## 2017-06-05 DIAGNOSIS — E875 Hyperkalemia: Secondary | ICD-10-CM | POA: Diagnosis not present

## 2017-06-05 DIAGNOSIS — I10 Essential (primary) hypertension: Secondary | ICD-10-CM | POA: Diagnosis not present

## 2017-06-05 DIAGNOSIS — R300 Dysuria: Secondary | ICD-10-CM | POA: Diagnosis not present

## 2017-06-05 DIAGNOSIS — D62 Acute posthemorrhagic anemia: Secondary | ICD-10-CM

## 2017-06-05 LAB — BASIC METABOLIC PANEL
BUN: 13 mg/dL (ref 7–25)
CO2: 22 mmol/L (ref 20–32)
CREATININE: 0.87 mg/dL (ref 0.70–1.25)
Calcium: 7.4 mg/dL — ABNORMAL LOW (ref 8.6–10.3)
Chloride: 105 mmol/L (ref 98–110)
Glucose, Bld: 106 mg/dL — ABNORMAL HIGH (ref 65–99)
POTASSIUM: 3.5 mmol/L (ref 3.5–5.3)
SODIUM: 138 mmol/L (ref 135–146)

## 2017-06-05 LAB — CBC WITH DIFFERENTIAL/PLATELET
BASOS ABS: 55 {cells}/uL (ref 0–200)
Basophils Relative: 0.4 %
EOS ABS: 575 {cells}/uL — AB (ref 15–500)
Eosinophils Relative: 4.2 %
HEMATOCRIT: 24.6 % — AB (ref 38.5–50.0)
Hemoglobin: 8.2 g/dL — ABNORMAL LOW (ref 13.2–17.1)
LYMPHS ABS: 1178 {cells}/uL (ref 850–3900)
MCH: 28.5 pg (ref 27.0–33.0)
MCHC: 33.3 g/dL (ref 32.0–36.0)
MCV: 85.4 fL (ref 80.0–100.0)
MPV: 9.6 fL (ref 7.5–12.5)
Monocytes Relative: 12.2 %
Neutro Abs: 10220 cells/uL — ABNORMAL HIGH (ref 1500–7800)
Neutrophils Relative %: 74.6 %
Platelets: 330 10*3/uL (ref 140–400)
RBC: 2.88 10*6/uL — ABNORMAL LOW (ref 4.20–5.80)
RDW: 15.6 % — AB (ref 11.0–15.0)
Total Lymphocyte: 8.6 %
WBC: 13.7 10*3/uL — ABNORMAL HIGH (ref 3.8–10.8)
WBCMIX: 1671 {cells}/uL — AB (ref 200–950)

## 2017-06-05 LAB — POCT URINALYSIS DIPSTICK
BILIRUBIN UA: NEGATIVE
GLUCOSE UA: NEGATIVE
KETONES UA: NEGATIVE
LEUKOCYTES UA: NEGATIVE
NITRITE UA: POSITIVE
Protein, UA: NEGATIVE
Spec Grav, UA: 1.015 (ref 1.010–1.025)
Urobilinogen, UA: 0.2 E.U./dL
pH, UA: 5 (ref 5.0–8.0)

## 2017-06-05 MED ORDER — CIPROFLOXACIN HCL 500 MG PO TABS: 500.0000 mg | ORAL_TABLET | Freq: Two times a day (BID) | ORAL | 0 refills | Status: DC

## 2017-06-05 MED ORDER — FUROSEMIDE 20 MG PO TABS: 20.0000 mg | ORAL_TABLET | Freq: Every day | ORAL | 0 refills | Status: DC

## 2017-06-05 NOTE — Progress Notes (Signed)
Subjective:     Patient ID: Austin [name], male   DOB: 1955-08-19, 61 y.o.   MRN: [id number]  HPI Patient seen for hospital follow-up. 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 He also vomited once the night before admission. This was nonbloody emesis. No fever. No cough. Minimal abdominal pain. Had prior colonoscopy 2013 with several polyps removed. Was taking Plavix at time of admission.  Patient's hemoglobin dropped down to 6.8. He received 2 different transfusions with total 4 units. He underwent EGD and colonoscopy 10/21. Evidence for gastritis and duodenitis with shallow linear ulcer in the bulb. Biopsy for H. pylori negative.  Patient had stenosis ostomy site and distal most 5-6 cm of colon into the ostomy was inflamed and friable and somewhat thickened.  Patient had hyperkalemia with potassium 6.5. Treated with IV insulin and bicarbonate. Acute kidney injury which was felt to be prerenal and improved with hydration.  His blood pressure was low on initial presentation and lisinopril been held because of his hyperkalemia. He had 2 brief episodes of possible SVT about 12 beats on telemetry and was started on low-dose Lopressor but they never started this after discharge. Blood pressures been very stable since discharge.  He does have some increased edema and his hands and legs since discharge. No dyspnea. No orthopnea. Appetite is stable. No fever.  Wife is concerned he may be developing early UTI. His urine is no more cloudy than usual but no definite fever. He has increased risk with neurogenic bladders above.  Past Medical History:  Diagnosis Date  .  Brainstem stroke ([place]) 10/16/2008   Qualifier: Diagnosis of  By: [name] LPN, [name]    . C. difficile colitis 02/08/2017  . Cerebrovascular accident ([place]) 1994   hx of brainstem stroke, residual diminished lung capacity  . Chronic kidney disease    Left Renal Mass  . Diabetes mellitus without complication (H[facility])    no medications;   . Hx of colonic polyps   . Hypertension   . Hypogonadism   . Intra-abdominal abscess ([place]) 01/23/2017  . 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 Neuromuscular disorder ([place])    quadraplegic  . Open abdominal incision with drainage 01/26/2017  . Pituitary adenoma ([place])   . 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 Pneumonia   . 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[place])   . Urinary incontinence   . Venous stasis    edema   Past Surgical History:  Procedure Laterality Date  . CHOLECYSTECTOMY    . COLON RESECTION N/A 05/04/2017   Procedure: LAPAROSCOPIC SIGMOID COLON RESECTION WITH END COLSOTOMY ERAS PATHWAY;  Surgeon: Alphonsa Overall, MD;  Location: [facility];  Service: General;  Laterality: N/A;  . COLONOSCOPY  02/27/2012   Procedure: COLONOSCOPY;  Surgeon: Jerene Bears, MD;  Location: [facility];  Service: Gastroenterology;  Laterality: N/A;  . COLONOSCOPY N/A 05/31/2017   Procedure: COLONOSCOPY;  Surgeon: Milus Banister, MD;  Location: [facility];  Service: Endoscopy;  Laterality: N/A;  . ESOPHAGOGASTRODUODENOSCOPY N/A 05/31/2017   Procedure: ESOPHAGOGASTRODUODENOSCOPY (EGD);  Surgeon: Milus Banister, MD;  Location: [facility];  Service: Endoscopy;  Laterality: N/A;  . gamma knife  radiation surgery  05/2010   for pituitary  . IR RADIOLOGIST EVAL & MGMT  02/12/2017  . IR RADIOLOGIST EVAL & MGMT  03/17/2017  . IR RADIOLOGIST EVAL & MGMT  04/14/2017  . PITUITARY SURGERY  2007   nose approach  . ROBOT ASSISTED LAPAROSCOPIC NEPHRECTOMY  04/23/2012   Procedure: ROBOTIC ASSISTED LAPAROSCOPIC NEPHRECTOMY;   Surgeon: Alexis Frock, MD;  Location: [facility];  Service: Urology;  Laterality: Left;  radical  . Robotic Partial Nephrectomy  04-23-12   left   Left Renal Mass  . stomach peg  02/1993   removed 5-6 years later  . TRACHEOSTOMY TUBE PLACEMENT  02/1993   removed 04/1993  . UMBILICAL HERNIA REPAIR  04/23/2012   Procedure: HERNIA REPAIR UMBILICAL ADULT;  Surgeon: Alexis Frock, MD;  Location: [facility];  Service: Urology;;  . [name]  . vocal cord surgery     injected with collagen, then fat from stomach to improve speech s/p stroke    reports that he quit smoking about 24 years ago. His smoking use included Cigarettes. He has a 11.50 pack-year smoking history. He has never used smokeless tobacco. He reports that he does not drink alcohol or use drugs. family history includes Alcohol abuse in his father; Arthritis in his maternal grandmother and mother; Colon cancer in his brother; Esophageal cancer (age of onset: 62) in his father; Hyperlipidemia in his mother; Hypertension in his brother; Stroke in his paternal grandmother; Throat cancer in his father; Uterine cancer (age of onset: 31) in his mother. Allergies  Allergen Reactions  . Penicillins Rash    Has patient had a PCN reaction causing immediate rash, facial/tongue/throat swelling, SOB or lightheadedness with hypotension: No Has patient had a PCN reaction causing severe rash involving mucus membranes or skin necrosis: No Has patient had a PCN reaction that required hospitalization No Has patient had a PCN reaction occurring within the last 10 years: Yes  If all of the above answers are "NO", then may proceed with Cephalosporin use.     Review of Systems  Constitutional: Positive for fatigue. Negative for chills and fever.  HENT: Negative for congestion.   Respiratory: Negative for cough and shortness of breath.   Cardiovascular: Negative for chest pain.  Gastrointestinal: Negative for abdominal pain, blood in stool, diarrhea,  nausea and vomiting.  Genitourinary: Negative for dysuria.  Hematological: Negative for adenopathy.  Psychiatric/Behavioral: Negative for confusion.       Objective:   Physical Exam  Constitutional: He is oriented to person, place, and time. He appears well-developed and well-nourished.  Cardiovascular: Normal rate and regular rhythm.   Pulmonary/Chest: Effort normal and breath sounds normal. No respiratory distress. He has no wheezes. He has no rales.  Abdominal: Soft. Bowel sounds are normal. He exhibits no distension and no mass. There is no tenderness. There is no rebound and no guarding.  Ostomy bag appears normal with no blood  Musculoskeletal:  Patient is 1+ pitting edema lower legs bilaterally. He also has some pitting edema in the hands bilaterally  Neurological: He is alert and oriented to person, place, and time. No cranial nerve deficit.       Assessment:     #1 acute blood loss anemia from GI bleed with recent transfusions as above  #2 colostomy related to perforation from diverticulitis  #3 recent acute kidney injury which is probably prerenal related to low blood pressure and anemia  #4 hyperkalemia  #5 hypertension stable currently off lisinopril  #6 question  of brief run of SVT. He is in regular rhythm at this point with pulse around 80.  #7 dysuria. High risk for colonization      Plan:     -Had discussion, as we have many times previously, about risk of colonization and not over treating urinary tract. Wife is very concerned with up coming weekend as he feels similarly has the past with UTIs.. Obtain culture. If he develops any fever start Cipro 500 mg twice a day -Recheck labs with CBC and basic metabolic panel -They had questions whether to start Lopressor. His blood pressure has been very stable at this point and since he has not had any palpitations or tachycardia this point we'll hold the Lopressor and also continue to hold lisinopril  -Wrote for  limited furosemide 20 mg 1 every other day for the next few days until edema improved -We had a discussion regarding potential further workup for arrhythmia with event monitoring and they decline at this point -Reassess in one month  [name] MD [facility]

## 2017-06-08 LAB — URINE CULTURE
MICRO NUMBER: 81202661
SPECIMEN QUALITY: ADEQUATE

## 2017-06-10 ENCOUNTER — Other Ambulatory Visit: Payer: Self-pay

## 2017-06-10 NOTE — Patient Outreach (Signed)
[facility]) Care Management  [place]  06/10/2017   Austin [name] 02-18-1956 [id number]  Subjective: Telephone call for weekly TOC.  Wife reports that that patient is doing well and that patient hemoglobin is up to 8.4.  She reports he still has some weakness and complaints of being cold.  She reports that otherwise he is doing fine.  Patient saw primary care doctor on 10/26 and patient was given Cipro for nitrates in urine and the lasix was ordered as needed for edema.  Discussed signs of GI bleeding and contacting physician. She verbalized understanding.   Objective:   Encounter Medications:  Outpatient Encounter Prescriptions as of 06/10/2017  Medication Sig Note  . acetaminophen (TYLENOL) 500 MG tablet Take 1 tablet (500 mg total) by mouth every 6 (six) hours as needed for mild pain, moderate pain, fever or headache.   . albuterol (PROVENTIL HFA;VENTOLIN HFA) 108 (90 BASE) MCG/ACT inhaler Inhale 2 puffs into the lungs every 6 (six) hours as needed for wheezing or shortness of breath.   Marland Kitchen atorvastatin (LIPITOR) 20 MG tablet Take 20 mg by mouth daily with breakfast.    . cetirizine (ZYRTEC) 10 MG tablet Take 10 mg by mouth daily with breakfast.   . ciprofloxacin (CIPRO) 500 MG tablet Take 1 tablet (500 mg total) by mouth 2 (two) times daily.   Marland Kitchen escitalopram (LEXAPRO) 10 MG tablet TAKE ONE TABLET BY MOUTH ONCE DAILY IN THE MORNING   . furosemide (LASIX) 20 MG tablet Take 1 tablet (20 mg total) by mouth daily.   Marland Kitchen ketoconazole (NIZORAL) 2 % cream APPLY AS NEEDED TO RASH (Patient taking differently: Apply to groin or legs daily as needed for rash)   . Melatonin 3 MG TABS Take 3 mg by mouth at bedtime as needed (for sleep).   . Multiple Vitamin (MULTIVITAMIN WITH MINERALS) TABS tablet Take 1 tablet by mouth daily with breakfast.    . pantoprazole (PROTONIX) 40 MG tablet Take 1 tablet (40 mg total) by mouth 2 (two) times daily.   Marland Kitchen saccharomyces boulardii  (FLORASTOR) 250 MG capsule Take 1 capsule (250 mg total) by mouth 2 (two) times daily.   . sodium bicarbonate 650 MG tablet Take 1 tablet (650 mg total) by mouth daily.   Marland Kitchen testosterone enanthate (DELATESTRYL) 200 MG/ML injection Inject 200 mg into the muscle every 14 (fourteen) days. For IM use only   . traMADol (ULTRAM) 50 MG tablet Take 50 mg by mouth every 6 (six) hours as needed for moderate pain.   . traZODone (DESYREL) 100 MG tablet TAKE ONE TABLET BY MOUTH AT BEDTIME   . triamcinolone cream (KENALOG) 0.1 % Apply 1 application topically 2 (two) times daily as needed (for rash). Applies to face   . metoprolol tartrate (LOPRESSOR) 25 MG tablet Take 0.5 tablets (12.5 mg total) by mouth 2 (two) times daily. Hold if sbp less than 90 or heart rate less than 60. (Patient not taking: Reported on 06/10/2017) 06/10/2017: Has not had to take.   No facility-administered encounter medications on file as of 06/10/2017.     Functional Status:  In your present state of health, do you have any difficulty performing the following activities: 06/03/2017 05/28/2017  Hearing? N -  Vision? N -  Difficulty concentrating or making decisions? N -  Walking or climbing stairs? Y -  Dressing or bathing? Y -  Doing errands, shopping? Y N  Preparing Food and eating ? Y -  Using the Toilet?  Y -  Do you have problems with loss of bowel control? N -  Managing your Medications? Y -  Managing your Finances? Y -  Housekeeping or managing your Housekeeping? Y -  Some recent data might be hidden    Fall/Depression Screening: Fall Risk  06/03/2017 02/19/2017 05/25/2014  Falls in the past year? No No No  Risk for fall due to : - - Other (Comment)  Risk for fall due to: Comment - - patient not ambulatory   PHQ 2/9 Scores 06/03/2017 02/19/2017 05/25/2014  PHQ - 2 Score 0 0 0    Assessment:  Patient/caregiver continue to benefit from care management outreach for transition of care.    Plan:  [facility] CM Care Plan  Problem One     Most Recent Value  Care Plan Problem One  at risk for readmission  Role Documenting the Problem One  Care Management Telephonic [place] for Problem One  Active  [facility] Long Term Goal   Patient will not be readmitted within the next 31 days.  [facility] Long Term Goal Start Date  06/03/17  Interventions for Problem One Long Term Goal  Reviewed signs of bleeding and notifying provider. Encouraged to call RN CM as needed  [facility] CM Short Term Goal #1   Patient/caregiver will keep follow up appointmet within the next 1 week.  [facility] CM Short Term Goal #1 Start Date  06/03/17  [facility] CM Short Term Goal #1 Met Date  06/10/17  Interventions for Short Term Goal #1  Patient to see primary doctor on 06/05/17  [facility] CM Short Term Goal #2   Patient/ caregiver will know signs of GI bleeding within 30 days.  [facility] CM Short Term Goal #2 Start Date  06/10/17  Interventions for Short Term Goal #2  Reviewed signs of GI bleeding.     RN CM will contact patient with the next 7 business days and patient agrees to next outreach.  [name], RN, MSN [facility] Care Management Care Management Coordinator Direct Line [phone] Toll Free: [phone]  Fax: [phone]

## 2017-06-15 ENCOUNTER — Encounter (HOSPITAL_COMMUNITY): Payer: Self-pay | Admitting: *Deleted

## 2017-06-15 ENCOUNTER — Inpatient Hospital Stay (HOSPITAL_COMMUNITY)
Admission: EM | Admit: 2017-06-15 | Discharge: 2017-06-18 | DRG: 393 | Disposition: A | Discharge status: Home / Self Care | Payer: PPO | Attending: General Surgery | Admitting: General Surgery

## 2017-06-15 DIAGNOSIS — K9403 Colostomy malfunction: Secondary | ICD-10-CM | POA: Diagnosis present

## 2017-06-15 DIAGNOSIS — Z8249 Family history of ischemic heart disease and other diseases of the circulatory system: Secondary | ICD-10-CM

## 2017-06-15 DIAGNOSIS — Z808 Family history of malignant neoplasm of other organs or systems: Secondary | ICD-10-CM | POA: Diagnosis not present

## 2017-06-15 DIAGNOSIS — Y833 Surgical operation with formation of external stoma as the cause of abnormal reaction of the patient, or of later complication, without mention of misadventure at the time of the procedure: Secondary | ICD-10-CM | POA: Diagnosis present

## 2017-06-15 DIAGNOSIS — E1122 Type 2 diabetes mellitus with diabetic chronic kidney disease: Secondary | ICD-10-CM | POA: Diagnosis present

## 2017-06-15 DIAGNOSIS — E119 Type 2 diabetes mellitus without complications: Secondary | ICD-10-CM

## 2017-06-15 DIAGNOSIS — C800 Disseminated malignant neoplasm, unspecified: Secondary | ICD-10-CM | POA: Diagnosis present

## 2017-06-15 DIAGNOSIS — Z86010 Personal history of colonic polyps: Secondary | ICD-10-CM | POA: Diagnosis not present

## 2017-06-15 DIAGNOSIS — K5720 Diverticulitis of large intestine with perforation and abscess without bleeding: Secondary | ICD-10-CM | POA: Diagnosis present

## 2017-06-15 DIAGNOSIS — Z9049 Acquired absence of other specified parts of digestive tract: Secondary | ICD-10-CM

## 2017-06-15 DIAGNOSIS — I69365 Other paralytic syndrome following cerebral infarction, bilateral: Secondary | ICD-10-CM

## 2017-06-15 DIAGNOSIS — Z905 Acquired absence of kidney: Secondary | ICD-10-CM

## 2017-06-15 DIAGNOSIS — Z933 Colostomy status: Secondary | ICD-10-CM

## 2017-06-15 DIAGNOSIS — I10 Essential (primary) hypertension: Secondary | ICD-10-CM | POA: Diagnosis present

## 2017-06-15 DIAGNOSIS — Z800 Family history of malignant neoplasm of digestive organs: Secondary | ICD-10-CM

## 2017-06-15 DIAGNOSIS — E291 Testicular hypofunction: Secondary | ICD-10-CM | POA: Diagnosis present

## 2017-06-15 DIAGNOSIS — Z823 Family history of stroke: Secondary | ICD-10-CM

## 2017-06-15 DIAGNOSIS — E785 Hyperlipidemia, unspecified: Secondary | ICD-10-CM | POA: Diagnosis present

## 2017-06-15 DIAGNOSIS — Z7902 Long term (current) use of antithrombotics/antiplatelets: Secondary | ICD-10-CM

## 2017-06-15 DIAGNOSIS — Z87891 Personal history of nicotine dependence: Secondary | ICD-10-CM

## 2017-06-15 DIAGNOSIS — G8250 Quadriplegia, unspecified: Secondary | ICD-10-CM | POA: Diagnosis present

## 2017-06-15 DIAGNOSIS — K9409 Other complications of colostomy: Secondary | ICD-10-CM | POA: Diagnosis present

## 2017-06-15 DIAGNOSIS — I129 Hypertensive chronic kidney disease with stage 1 through stage 4 chronic kidney disease, or unspecified chronic kidney disease: Secondary | ICD-10-CM | POA: Diagnosis present

## 2017-06-15 DIAGNOSIS — N179 Acute kidney failure, unspecified: Secondary | ICD-10-CM

## 2017-06-15 DIAGNOSIS — N183 Chronic kidney disease, stage 3 (moderate): Secondary | ICD-10-CM | POA: Diagnosis present

## 2017-06-15 DIAGNOSIS — Z8349 Family history of other endocrine, nutritional and metabolic diseases: Secondary | ICD-10-CM

## 2017-06-15 DIAGNOSIS — C649 Malignant neoplasm of unspecified kidney, except renal pelvis: Secondary | ICD-10-CM | POA: Diagnosis present

## 2017-06-15 DIAGNOSIS — Z85520 Personal history of malignant carcinoid tumor of kidney: Secondary | ICD-10-CM

## 2017-06-15 DIAGNOSIS — Z880 Allergy status to penicillin: Secondary | ICD-10-CM

## 2017-06-15 DIAGNOSIS — Z8049 Family history of malignant neoplasm of other genital organs: Secondary | ICD-10-CM

## 2017-06-15 HISTORY — DX: Duodenal ulcer, unspecified as acute or chronic, without hemorrhage or perforation: K26.9

## 2017-06-15 HISTORY — DX: Gastrointestinal hemorrhage, unspecified: K92.2

## 2017-06-15 LAB — CBC
HEMATOCRIT: 30.7 % — AB (ref 39.0–52.0)
HEMOGLOBIN: 9.4 g/dL — AB (ref 13.0–17.0)
MCH: 26.6 pg (ref 26.0–34.0)
MCHC: 30.6 g/dL (ref 30.0–36.0)
MCV: 86.7 fL (ref 78.0–100.0)
Platelets: 367 10*3/uL (ref 150–400)
RBC: 3.54 MIL/uL — ABNORMAL LOW (ref 4.22–5.81)
RDW: 17.5 % — ABNORMAL HIGH (ref 11.5–15.5)
WBC: 11.2 10*3/uL — ABNORMAL HIGH (ref 4.0–10.5)

## 2017-06-15 LAB — COMPREHENSIVE METABOLIC PANEL
ALK PHOS: 67 U/L (ref 38–126)
ALT: 16 U/L — ABNORMAL LOW (ref 17–63)
ANION GAP: 12 (ref 5–15)
AST: 25 U/L (ref 15–41)
Albumin: 2.6 g/dL — ABNORMAL LOW (ref 3.5–5.0)
BUN: 14 mg/dL (ref 6–20)
CALCIUM: 8.2 mg/dL — AB (ref 8.9–10.3)
CHLORIDE: 103 mmol/L (ref 101–111)
CO2: 24 mmol/L (ref 22–32)
Creatinine, Ser: 1.38 mg/dL — ABNORMAL HIGH (ref 0.61–1.24)
GFR calc Af Amer: >60 mL/min (ref >60)
GFR calc non Af Amer: 53 mL/min — ABNORMAL LOW (ref >60)
GLUCOSE: 75 mg/dL (ref 65–99)
Potassium: 4.1 mmol/L (ref 3.5–5.1)
SODIUM: 139 mmol/L (ref 135–145)
Total Bilirubin: 0.9 mg/dL (ref 0.3–1.2)
Total Protein: 7.2 g/dL (ref 6.5–8.1)

## 2017-06-15 LAB — LIPASE, BLOOD: LIPASE: 19 U/L (ref 11–51)

## 2017-06-15 MED ORDER — LORATADINE 10 MG PO TABS
10.0000 mg | ORAL_TABLET | Freq: Every day | ORAL | Status: DC
  Administered 2017-06-16 – 2017-06-18 (×3): 10 mg via ORAL
  Filled 2017-06-15 (×3): qty 1

## 2017-06-15 MED ORDER — ONDANSETRON HCL 4 MG/2ML IJ SOLN: 4.0000 mg | Freq: Four times a day (QID) | INTRAMUSCULAR | Status: DC | PRN

## 2017-06-15 MED ORDER — SODIUM CHLORIDE 0.9 % IV BOLUS (SEPSIS)
1000.0000 mL | Freq: Once | INTRAVENOUS | Status: AC
  Administered 2017-06-15: 1000 mL via INTRAVENOUS

## 2017-06-15 MED ORDER — ESCITALOPRAM OXALATE 10 MG PO TABS
10.0000 mg | ORAL_TABLET | Freq: Every day | ORAL | Status: DC
  Administered 2017-06-16 – 2017-06-18 (×3): 10 mg via ORAL
  Filled 2017-06-15 (×3): qty 1

## 2017-06-15 MED ORDER — MORPHINE SULFATE (PF) 4 MG/ML IV SOLN: 4.0000 mg | Freq: Once | INTRAVENOUS | Status: DC

## 2017-06-15 MED ORDER — MORPHINE SULFATE (PF) 2 MG/ML IV SOLN
1.0000 mg | INTRAVENOUS | Status: DC | PRN
  Administered 2017-06-15: 2 mg via INTRAVENOUS
  Filled 2017-06-15: qty 1

## 2017-06-15 MED ORDER — ATORVASTATIN CALCIUM 20 MG PO TABS
20.0000 mg | ORAL_TABLET | Freq: Every day | ORAL | Status: DC
  Administered 2017-06-16 – 2017-06-18 (×3): 20 mg via ORAL
  Filled 2017-06-15 (×3): qty 1

## 2017-06-15 MED ORDER — KCL IN DEXTROSE-NACL 20-5-0.45 MEQ/L-%-% IV SOLN
INTRAVENOUS | Status: DC
  Administered 2017-06-15: 23:00:00 via INTRAVENOUS
  Administered 2017-06-16: 75 mL/h via INTRAVENOUS
  Filled 2017-06-15 (×3): qty 1000

## 2017-06-15 MED ORDER — ADULT MULTIVITAMIN W/MINERALS CH
1.0000 | ORAL_TABLET | Freq: Every day | ORAL | Status: DC
  Administered 2017-06-16 – 2017-06-18 (×3): 1 via ORAL
  Filled 2017-06-15 (×3): qty 1

## 2017-06-15 MED ORDER — PANTOPRAZOLE SODIUM 40 MG IV SOLR
40.0000 mg | INTRAVENOUS | Status: DC
  Administered 2017-06-16 – 2017-06-17 (×2): 40 mg via INTRAVENOUS
  Filled 2017-06-15 (×2): qty 40

## 2017-06-15 MED ORDER — TRAZODONE HCL 100 MG PO TABS
100.0000 mg | ORAL_TABLET | Freq: Every day | ORAL | Status: DC
  Administered 2017-06-15 – 2017-06-17 (×3): 100 mg via ORAL
  Filled 2017-06-15 (×3): qty 1

## 2017-06-15 MED ORDER — ACETAMINOPHEN 500 MG PO TABS: 500.0000 mg | ORAL_TABLET | Freq: Four times a day (QID) | ORAL | Status: DC | PRN

## 2017-06-15 MED ORDER — SODIUM CHLORIDE 0.9 % IV SOLN: INTRAVENOUS | Status: DC

## 2017-06-15 MED ORDER — SACCHAROMYCES BOULARDII 250 MG PO CAPS
250.0000 mg | ORAL_CAPSULE | Freq: Two times a day (BID) | ORAL | Status: DC
  Administered 2017-06-15 – 2017-06-18 (×6): 250 mg via ORAL
  Filled 2017-06-15 (×6): qty 1

## 2017-06-15 MED ORDER — MELATONIN 3 MG PO TABS: 3.0000 mg | ORAL_TABLET | Freq: Every evening | ORAL | Status: DC | PRN

## 2017-06-15 MED ORDER — MORPHINE SULFATE (PF) 4 MG/ML IV SOLN
4.0000 mg | Freq: Once | INTRAVENOUS | Status: AC
  Administered 2017-06-15: 4 mg via INTRAVENOUS
  Filled 2017-06-15: qty 1

## 2017-06-15 MED ORDER — KETOCONAZOLE 2 % EX CREA
TOPICAL_CREAM | Freq: Every day | CUTANEOUS | Status: DC | PRN
  Filled 2017-06-15: qty 15

## 2017-06-15 NOTE — ED Notes (Signed)
This nurse attempted PIV placement, but was unsuccessful. PIV team consult placed. Patient and wife updated on POC

## 2017-06-15 NOTE — Consult Note (Signed)
[facility] Surgery Consult/Admission Note  Austin [name] [birth year]  [id number].    Requesting MD: Arlean Hopping, PA-C Chief Complaint/Reason for Consult: no colostomy output  HPI:   Pt is a 61 year old male with a history of quadriplegia from a stroke in 1993, metastatic renal cell carcinoma S/P left radical nephrectomy in 2013 followed by Dr. Jerilynn Mages, C. difficile, laparoscopic sigmoid colon resection with end colostomy for metastatic carcinoma by Dr. Lucia Gaskins on 05/04/17, who returned a hospital roughly 10/19 with GI bleed and bleeding from ostomy who presented to the emergency department today with complaints of scant ostomy output. Wife at bedside and she states that on Thursday patient started having less output than normal. Patient decrease his diet to liquids and has had scant output over the last 2 days. Complaining of intermittent, nonradiating, severe cramping sensations. No associated symptoms. Patient states he feels that he needs to have a bowel movement but is unable to. He denies nausea, vomiting, chest pain, shortness of breath. I was unable to digitize patient's colostomy at bedside. Patient has no other complaints at this time.  ROS:  Review of Systems  Constitutional: Negative for chills and fever.  Respiratory: Negative for shortness of breath.   Cardiovascular: Negative for chest pain.  Gastrointestinal: Positive for abdominal pain. Negative for blood in stool, diarrhea, nausea and vomiting.  Skin: Negative for rash.  Neurological: Negative for dizziness and loss of consciousness.  All other systems reviewed and are negative.    Family History  Problem Relation Age of Onset  . Alcohol abuse Father   . Throat cancer Father   . Esophageal cancer Father 53  . Arthritis Mother   . Hyperlipidemia Mother   . Uterine cancer Mother 66  . Colon cancer Brother   . Arthritis Maternal Grandmother   . Hypertension Brother   . Stroke Paternal Grandmother     Past  Medical History:  Diagnosis Date  . 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 C. difficile colitis 02/08/2017  . Cerebrovascular accident ([place]) 1994   hx of brainstem stroke, residual diminished lung capacity  . Chronic kidney disease    Left Renal Mass  . Diabetes mellitus without complication (HCC)    no medications;   . Hx of colonic polyps   . Hypertension   . Hypogonadism   . Intra-abdominal abscess ([place]) 01/23/2017  . 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 Neuromuscular disorder ([place])    quadraplegic  . Open abdominal incision with drainage 01/26/2017  . Pituitary adenoma ([place])   . 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 Pneumonia   . 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[place])   . Urinary incontinence   . Venous stasis    edema    Past Surgical History:  Procedure Laterality Date  . CHOLECYSTECTOMY    . gamma knife radiation surgery  05/2010   for pituitary  . IR RADIOLOGIST EVAL & MGMT  02/12/2017  . IR RADIOLOGIST EVAL & MGMT  03/17/2017  . IR RADIOLOGIST EVAL & MGMT  04/14/2017  . PITUITARY SURGERY  2007   nose approach  . Robotic Partial Nephrectomy  04-23-12   left   Left Renal Mass  . stomach peg  02/1993   removed 5-6 years later  . TRACHEOSTOMY TUBE PLACEMENT  02/1993   removed 04/1993  . VASECTOMY  1984  . vocal cord  surgery     injected with collagen, then fat from stomach to improve speech s/p stroke    Social History:  reports that he quit smoking about 24 years ago. His smoking use included cigarettes. He has a 11.50 pack-year smoking history. he has never used smokeless tobacco. He reports that he does not drink alcohol or use drugs.  Allergies:  Allergies  Allergen Reactions  . Penicillins Rash    Has patient had a PCN reaction causing immediate rash, facial/tongue/throat swelling, SOB or lightheadedness with hypotension: YES Has patient had a PCN  reaction causing severe rash involving mucus membranes or skin necrosis: No Has patient had a PCN reaction that required hospitalization No Has patient had a PCN reaction occurring within the last 10 years: Yes  If all of the above answers are "NO", then may proceed with Cephalosporin use.     (Not in a hospital admission)  Blood pressure (!) 142/70, pulse 91, temperature 98.2 F (36.8 C), temperature source Oral, resp. rate 12, SpO2 98 %.  Physical Exam  Constitutional: He is oriented to person, place, and time and well-developed, well-nourished, and in no distress. No distress.  HENT:  Head: Normocephalic and atraumatic.  Nose: Nose normal.  Mouth/Throat: Oropharynx is clear and moist.  Eyes: Conjunctivae are normal. Right eye exhibits no discharge. Left eye exhibits no discharge. No scleral icterus.  Pupils are equal and round  Cardiovascular: Normal rate, regular rhythm and normal heart sounds. Exam reveals no gallop and no friction rub.  No murmur heard. Pulmonary/Chest: Effort normal and breath sounds normal. No respiratory distress. He has no wheezes. He has no rales. He exhibits no tenderness.  Abdominal: Soft. Bowel sounds are normal. He exhibits no distension. There is tenderness (mild generalized).  Colostomy in left lower quadrant with very small stoma that I was unable to digitize, scant liquid stool in bag and no gas, stoma is pink and tissue looks healthy  Musculoskeletal: He exhibits no edema.  Neurological: He is alert and oriented to person, place, and time.  Skin: Skin is warm and dry. No rash noted. He is not diaphoretic.  Psychiatric: Mood and affect normal.  Nursing note and vitals reviewed.   Results for orders placed or performed during the hospital encounter of 06/15/17 (from the past 48 hour(s))  Lipase, blood     Status: None   Collection Time: 06/15/17  2:14 PM  Result Value Ref Range   Lipase 19 11 - 51 U/L  Comprehensive metabolic panel     Status:  Abnormal   Collection Time: 06/15/17  2:14 PM  Result Value Ref Range   Sodium 139 135 - 145 mmol/L   Potassium 4.1 3.5 - 5.1 mmol/L   Chloride 103 101 - 111 mmol/L   CO2 24 22 - 32 mmol/L   Glucose, Bld 75 65 - 99 mg/dL   BUN 14 6 - 20 mg/dL   Creatinine, Ser 1.38 (H) 0.61 - 1.24 mg/dL   Calcium 8.2 (L) 8.9 - 10.3 mg/dL   Total Protein 7.2 6.5 - 8.1 g/dL   Albumin 2.6 (L) 3.5 - 5.0 g/dL   AST 25 15 - 41 U/L   ALT 16 (L) 17 - 63 U/L   Alkaline Phosphatase 67 38 - 126 U/L   Total Bilirubin 0.9 0.3 - 1.2 mg/dL   GFR calc non Af Amer 53 (L) >60 mL/min   GFR calc Af Amer >60 >60 mL/min    Comment: (NOTE) The eGFR has been calculated  using the CKD EPI equation. This calculation has not been validated in all clinical situations. eGFR's persistently <60 mL/min signify possible Chronic Kidney Disease.    Anion gap 12 5 - 15  CBC     Status: Abnormal   Collection Time: 06/15/17  2:14 PM  Result Value Ref Range   WBC 11.2 (H) 4.0 - 10.5 K/uL   RBC 3.54 (L) 4.22 - 5.81 MIL/uL   Hemoglobin 9.4 (L) 13.0 - 17.0 g/dL   HCT 30.7 (L) 39.0 - 52.0 %   MCV 86.7 78.0 - 100.0 fL   MCH 26.6 26.0 - 34.0 pg   MCHC 30.6 30.0 - 36.0 g/dL   RDW 17.5 (H) 11.5 - 15.5 %   Platelets 367 150 - 400 K/uL   No results found.    Assessment/Plan  Colostomy stricture - ask medicine to admit - I called GI and spoke with Dr. Hilarie Fredrickson and he does not believe that they will be able to stent this but he is going to talk to Dr. Ardis Hughs to be sure - If GI is unable to stent then pt will need a colostomy redo  Thank you for the consult and the opportunity to take part in the care of Mr. Dellas Guard, [facility] Surgery 06/15/2017, 3:12 PM Pager: [phone] Consults: [phone] Mon-Fri 7:00 am-4:30 pm Sat-Sun 7:00 am-11:30 am

## 2017-06-15 NOTE — ED Triage Notes (Signed)
Pt complains of abdominal pain and decreased output in his colostomy bag for the past 3-4 days. Pt denies emesis.

## 2017-06-15 NOTE — Progress Notes (Signed)
Page out to general surgery for pain medicine.

## 2017-06-15 NOTE — H&P (Addendum)
History and Physical    KRISTJAN DERNER BMW:[id number] DOB: Nov 01, 1955 DOA: 06/15/2017  PCP: [name], MD  Patient coming from:Home  Chief Complaint:decreased colostomy output  HPI: Austin [name] is a 61 y.o. male with medical history significant of brain is stable stroke with residual quadriplegia, left renal mass status post left nephrectomy, intra-abdominal abscess status post laparoscopic sigmoid colon resection with end colostomy on September 2018, recent admission in October for GI bleed, presented to the hospital for decreased colostomy output for to 3 days associated with nausea, abdominal discomfort and dehydration. Most of the history obtained by patient's wife at bedside. Denied headache, dizziness, vomiting, chest pain or shortness of breath. Reports some abdominal discomfort. Feels dehydrated since he has not been eating well for last 2-3 days.  ED Course: General surgery was consulted. Asked [place] for admission.  Review of Systems: As per HPI otherwise 10 point review of systems negative.    Past Medical History:  Diagnosis Date  . 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 C. difficile colitis 02/08/2017  . Cerebrovascular accident ([place]) 1994   hx of brainstem stroke, residual diminished lung capacity  . Chronic kidney disease    Left Renal Mass  . Diabetes mellitus without complication (HCC)    no medications;   . Hx of colonic polyps   . Hypertension   . Hypogonadism   . Intra-abdominal abscess ([place]) 01/23/2017  . 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 Neuromuscular disorder ([place])    quadraplegic  . Open abdominal incision with drainage 01/26/2017  . Pituitary adenoma ([place])   . 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 Pneumonia   . 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[place])   . Urinary incontinence   . Venous stasis    edema    Past Surgical History:    Procedure Laterality Date  . CHOLECYSTECTOMY    . gamma knife radiation surgery  05/2010   for pituitary  . IR RADIOLOGIST EVAL & MGMT  02/12/2017  . IR RADIOLOGIST EVAL & MGMT  03/17/2017  . IR RADIOLOGIST EVAL & MGMT  04/14/2017  . PITUITARY SURGERY  2007   nose approach  . Robotic Partial Nephrectomy  04-23-12   left   Left Renal Mass  . stomach peg  02/1993   removed 5-6 years later  . TRACHEOSTOMY TUBE PLACEMENT  02/1993   removed 04/1993  . VASECTOMY  1984  . vocal cord surgery     injected with collagen, then fat from stomach to improve speech s/p stroke    Social history: reports that he quit smoking about 24 years ago. His smoking use included cigarettes. He has a 11.50 pack-year smoking history. he has never used smokeless tobacco. He reports that he does not drink alcohol or use drugs.  Allergies  Allergen Reactions  . Penicillins Rash    Has patient had a PCN reaction causing immediate rash, facial/tongue/throat swelling, SOB or lightheadedness with hypotension: YES Has patient had a PCN reaction causing severe rash involving mucus membranes or skin necrosis: No Has patient had a PCN reaction that required hospitalization No Has patient had a PCN reaction occurring within the last 10 years: Yes  If all of the above answers are "NO", then may proceed with Cephalosporin use.    Family History  Problem Relation Age of Onset  . Alcohol abuse Father   .  Throat cancer Father   . Esophageal cancer Father 51  . Arthritis Mother   . Hyperlipidemia Mother   . Uterine cancer Mother 35  . Colon cancer Brother   . Arthritis Maternal Grandmother   . Hypertension Brother   . Stroke Paternal Grandmother      Prior to Admission medications   Medication Sig Start Date End Date Taking? Authorizing Provider  acetaminophen (TYLENOL) 500 MG tablet Take 1 tablet (500 mg total) by mouth every 6 (six) hours as needed for mild pain, moderate pain, fever or headache. 06/02/17  Yes Florencia Reasons, MD  albuterol (PROVENTIL HFA;VENTOLIN HFA) 108 (90 BASE) MCG/ACT inhaler Inhale 2 puffs into the lungs every 6 (six) hours as needed for wheezing or shortness of breath. 07/07/15  Yes Debbrah Alar, NP  atorvastatin (LIPITOR) 20 MG tablet Take 20 mg by mouth daily with breakfast.    Yes [provider]  cetirizine (ZYRTEC) 10 MG tablet Take 10 mg by mouth daily with breakfast.   Yes [provider]  clopidogrel (PLAVIX) 75 MG tablet Take 75 mg daily by mouth.   Yes [provider]  escitalopram (LEXAPRO) 10 MG tablet TAKE ONE TABLET BY MOUTH ONCE DAILY IN THE MORNING 03/16/17  Yes [name], [name], MD  furosemide (LASIX) 20 MG tablet Take 1 tablet (20 mg total) by mouth daily. 06/05/17  Yes [name], [name], MD  ketoconazole (NIZORAL) 2 % cream APPLY AS NEEDED TO RASH Patient taking differently: Apply to groin or legs daily as needed for rash 10/23/16  Yes [name], [name], MD  Melatonin 3 MG TABS Take 3 mg by mouth at bedtime as needed (for sleep).   Yes [provider]  metoprolol tartrate (LOPRESSOR) 25 MG tablet Take 0.5 tablets (12.5 mg total) by mouth 2 (two) times daily. Hold if sbp less than 90 or heart rate less than 60. 06/02/17  Yes Florencia Reasons, MD  Multiple Vitamin (MULTIVITAMIN WITH MINERALS) TABS tablet Take 1 tablet by mouth daily with breakfast.    Yes [provider]  pantoprazole (PROTONIX) 40 MG tablet Take 1 tablet (40 mg total) by mouth 2 (two) times daily. 06/02/17 06/02/18 Yes Florencia Reasons, MD  saccharomyces boulardii (FLORASTOR) 250 MG capsule Take 1 capsule (250 mg total) by mouth 2 (two) times daily. 01/30/17  Yes Aline August, MD  sodium bicarbonate 650 MG tablet Take 1 tablet (650 mg total) by mouth daily. 06/02/17  Yes Florencia Reasons, MD  testosterone enanthate (DELATESTRYL) 200 MG/ML injection Inject 200 mg into the muscle every 14 (fourteen) days. For IM use only   Yes [provider]  traMADol (ULTRAM) 50 MG  tablet Take 50 mg by mouth every 6 (six) hours as needed for moderate pain.   Yes [provider]  traZODone (DESYREL) 100 MG tablet TAKE ONE TABLET BY MOUTH AT BEDTIME 03/16/17  Yes [name], [name], MD  triamcinolone cream (KENALOG) 0.1 % Apply 1 application topically 2 (two) times daily as needed (for rash). Applies to face   Yes [provider]  ciprofloxacin (CIPRO) 500 MG tablet Take 1 tablet (500 mg total) by mouth 2 (two) times daily. Patient not taking: Reported on 06/15/2017 06/05/17   [name], MD    Physical Exam: Vitals:   06/15/17 1023 06/15/17 1137 06/15/17 1353 06/15/17 1449  BP: (!) 153/69 (!) 151/84 (!) 158/76 (!) 142/70  Pulse: 96 91 90 90  Resp: _0 Temp: 98.3 F (36.8 C)  98.4 F (36.9 C)  98.2 F (36.8 C)  TempSrc: Oral Oral  Oral  SpO2: 95% 97% 100% 95%      Constitutional: NAD, calm, comfortable Vitals:   06/15/17 1023 06/15/17 1137 06/15/17 1353 06/15/17 1449  BP: (!) 153/69 (!) 151/84 (!) 158/76 (!) 142/70  Pulse: 96 91 90 90  Resp: _0 Temp: 98.3 F (36.8 C) 98.4 F (36.9 C)  98.2 F (36.8 C)  TempSrc: Oral Oral  Oral  SpO2: 95% 97% 100% 95%   Eyes: PERRL, lids and conjunctivae normal ENMT: Mucous membranes are dry.  Neck: normal, supple Respiratory: clear to auscultation bilaterally, no wheezing, no crackles. Normal respiratory effort. No accessory muscle use.  Cardiovascular: Regular rate and rhythm, S1S2 Normal. no murmurs / rubs / gallops. No extremity edema.   Abdomen: Soft, Non-tender, mild distention. Bowel sounds sluggish.  Musculoskeletal: Quadriplegia, no edema Neurologic: Alert awake, quadriplegia   Labs on Admission: I have personally reviewed following labs and imaging studies  CBC: Recent Labs  Lab 06/15/17 1414  WBC 11.2*  HGB 9.4*  HCT 30.7*  MCV 86.7  PLT 035   Basic Metabolic Panel: Recent Labs  Lab 06/15/17 1414  NA 139  K 4.1  CL 103  CO2 24  GLUCOSE 75  BUN 14    CREATININE 1.38*  CALCIUM 8.2*   GFR: Estimated Creatinine Clearance: 59.8 mL/min (A) (by C-G formula based on SCr of 1.38 mg/dL (H)). Liver Function Tests: Recent Labs  Lab 06/15/17 1414  AST 25  ALT 16*  ALKPHOS 67  BILITOT 0.9  PROT 7.2  ALBUMIN 2.6*   Recent Labs  Lab 06/15/17 1414  LIPASE 19   No results for input(s): AMMONIA in the last 168 hours. Coagulation Profile: No results for input(s): INR, PROTIME in the last 168 hours. Cardiac Enzymes: No results for input(s): CKTOTAL, CKMB, CKMBINDEX, TROPONINI in the last 168 hours. BNP (last 3 results) No results for input(s): PROBNP in the last 8760 hours. HbA1C: No results for input(s): HGBA1C in the last 72 hours. CBG: No results for input(s): GLUCAP in the last 168 hours. Lipid Profile: No results for input(s): CHOL, HDL, LDLCALC, TRIG, CHOLHDL, LDLDIRECT in the last 72 hours. Thyroid Function Tests: No results for input(s): TSH, T4TOTAL, FREET4, T3FREE, THYROIDAB in the last 72 hours. Anemia Panel: No results for input(s): VITAMINB12, FOLATE, FERRITIN, TIBC, IRON, RETICCTPCT in the last 72 hours. Urine analysis:    Component Value Date/Time   COLORURINE YELLOW 05/29/2017 1915   APPEARANCEUR CLEAR 05/29/2017 1915   LABSPEC 1.017 05/29/2017 1915   PHURINE 5.0 05/29/2017 1915   GLUCOSEU NEGATIVE 05/29/2017 1915   HGBUR NEGATIVE 05/29/2017 1915   HGBUR negative 02/01/2010 1022   BILIRUBINUR neg 06/05/2017 [place] 05/29/2017 1915   PROTEINUR neg 06/05/2017 1635   PROTEINUR NEGATIVE 05/29/2017 1915   UROBILINOGEN 0.2 06/05/2017 1635   UROBILINOGEN 0.2 02/01/2010 1022   NITRITE positive 06/05/2017 1635   NITRITE NEGATIVE 05/29/2017 1915   LEUKOCYTESUR Negative 06/05/2017 1635   Sepsis Labs: !!!!!!!!!!!!!!!!!!!!!!!!!!!!!!!!!!!!!!!!!!!! _1 (procalcitonin:4,lacticidven:4) ) Recent Results (from the past 240 hour(s))  Urine Culture     Status: Abnormal   Collection Time: 06/05/17   4:43 PM  Result Value Ref Range Status   MICRO NUMBER: [id number]  Final   SPECIMEN QUALITY: ADEQUATE  Final   Sample Source URINE  Final   STATUS: FINAL  Final   ISOLATE 1: Enterobacter aerogenes (A)  Final  Susceptibility   Enterobacter aerogenes - URINE CULTURE, REFLEX    AMOX/CLAVULANIC >=32 Resistant     CEFAZOLIN* >=64 Resistant      * For uncomplicated UTI caused by E. coli,K. pneumoniae or P. mirabilis: Cefazolin issusceptible if MIC <32 mcg/mL and predictssusceptible to the oral agents cefaclor, cefdinir,cefpodoxime, cefprozil, cefuroxime, cephalexinand loracarbef.    CEFEPIME <=1 Sensitive     CEFTRIAXONE <=1 Sensitive     CIPROFLOXACIN 2 Intermediate     LEVOFLOXACIN >=8 Resistant     ERTAPENEM <=0.5 Sensitive     GENTAMICIN <=1 Sensitive     NITROFURANTOIN <=16 Sensitive     PIP/TAZO <=4 Sensitive     TOBRAMYCIN <=1 Sensitive     TRIMETH/SULFA* <=20 Sensitive      * For uncomplicated UTI caused by E. coli,K. pneumoniae or P. mirabilis: Cefazolin issusceptible if MIC <32 mcg/mL and predictssusceptible to the oral agents cefaclor, cefdinir,cefpodoxime, cefprozil, cefuroxime, cephalexinand loracarbef.Legend:S = Susceptible  I = IntermediateR = Resistant  NS = Not susceptible* = Not tested  NR = Not reported**NN = See antimicrobic comments     Radiological Exams on Admission: No results found.   Assessment/Plan Active Problems:   Colostomy dysfunction (HCC)  # Colostomy stricture causing decrease output: -Already evaluated by general surgery, discussed with the surgery PA. I'll keep nothing by mouth and start IV fluid, Protonix IV. Further evaluation and plan defer to general surgery.  #CKD stage 3: Patient has external catheter. Monitor BMP, avoid nephrotoxins.  # History of stroke with quadriplegia: Continue Lipitor when this and can take orally. Plavix on hold because patient may need surgical intervention. Continue supportive care.  Continue home medications when  patient is able to take orally. Currently he is nothing by mouth and on IV fluid. I'll check blood sugar levels while he is nothing by mouth. Discussed with the patient and his wife in ER.  DVT prophylaxis:  Code Status: Full code Family Communication: Wife in ER Disposition Plan: MedSurg Consults called: General surgery by ER Admission status: Observation   Dron Tanna Furry MD [facility] Pager [phone]  If 7PM-7AM, please contact night-coverage [email or web address] Password [email or web address]  06/15/2017, 3:58 PM

## 2017-06-15 NOTE — ED Provider Notes (Signed)
[facility] DEPT Provider Note   CSN: [id number] Arrival date & time: 06/15/17  0919     History   Chief Complaint Chief Complaint  Patient presents with  . Abdominal Pain    HPI Austin [name] is a 61 y.o. male.  HPI  Austin [name] is a 61 y.o. male, with a history of brainstem stroke with quadriplegia, CKD, diverticular abscess, colostomy, and HTN, presenting to the ED with abdominal pain and decreased colostomy output beginning 11/1. Accompanied by chills and anorexia. Pain is RLQ and LLQ, cramping, "feels like I have to have a BM but can't," 8/10. Also notes a decrease in the ostomy size.  Wife states that they usually have to empty the bag about 4 times a day with solid stool.  Over the past 3-4 days she estimates she has emptied about 2 tablespoons of liquid stool twice a day. Followed by Dr. Lucia Gaskins for his ostomy. Originally placed in Sept 2018, s/p surgical I&D of diverticular abscess and nonhealing fistula.  Denies hematochezia/melena, nausea/vomiting, CP, SOB, abdominal distension, or any other complaints.     Past Medical History:  Diagnosis Date  . Brainstem stroke ([place]) 10/16/2008   Qualifier: Diagnosis of  By: [name] LPN, [name]    . C. difficile colitis 02/08/2017  . Cerebrovascular accident ([place]) 1994   hx of brainstem stroke, residual diminished lung capacity  . Chronic kidney disease    Left Renal Mass  . Diabetes mellitus without complication (HCC)    no medications;   . Hx of colonic polyps   . Hypertension   . Hypogonadism   . Intra-abdominal abscess ([place]) 01/23/2017  . 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 Neuromuscular disorder ([place])    quadraplegic  . Open abdominal incision with drainage 01/26/2017  . Pituitary adenoma ([place])   . 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 Pneumonia   . 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[place])   . Urinary incontinence   . Venous  stasis    edema    Patient Active Problem List   Diagnosis Date Noted  . 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 AKI (acute kidney injury) ([place])   . Duodenal ulcer   . Colostomy in place  05/30/2017  . Blood loss anemia 05/29/2017  . GIB (gastrointestinal bleeding) 05/28/2017  . S/p nephrectomy 05/28/2017  . 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 Perforation of sigmoid colon due to diverticulitis s/p colostomy 9/18 09/27/2016  . Fatigue 05/23/2015  . Type 2 diabetes mellitus, controlled ([place]) 06/01/2014  . Hyperlipidemia 10/17/2013  . Renal carcinoma metastatic with carcinomatosis 11/09/2012  . Type 2 diabetes mellitus ([place]) 03/31/2012  . Anemia associated with acute blood loss 02/28/2012  . Abdominal pain, other specified site 02/27/2012  . Low testosterone 06/21/2010  . WEAKNESS 04/03/2010  . FACIAL WEAKNESS 04/03/2010  . EDEMA 01/15/2009  . Pituitary tumor 01/15/2009  . Essential hypertension 10/16/2008  . Neurogenic bladder 10/16/2008  . OTHER SEBORRHEIC DERMATITIS 10/16/2008  . COLONIC POLYPS, HX OF 10/16/2008    Past Surgical History:  Procedure Laterality Date  . CHOLECYSTECTOMY    . gamma knife radiation surgery  05/2010   for pituitary  . IR RADIOLOGIST EVAL & MGMT  02/12/2017  . IR RADIOLOGIST EVAL & MGMT  03/17/2017  . IR RADIOLOGIST EVAL & MGMT  04/14/2017  . PITUITARY SURGERY  2007   nose approach  . Robotic Partial Nephrectomy  04-23-12  left   Left Renal Mass  . stomach peg  02/1993   removed 5-6 years later  . TRACHEOSTOMY TUBE PLACEMENT  02/1993   removed 04/1993  . VASECTOMY  1984  . vocal cord surgery     injected with collagen, then fat from stomach to improve speech s/p stroke       Home Medications    Prior to Admission medications   Medication Sig Start Date End Date Taking? Authorizing Provider  acetaminophen (TYLENOL) 500 MG tablet Take 1 tablet (500 mg total) by mouth every 6 (six) hours as needed for mild pain, moderate pain,  fever or headache. 06/02/17  Yes Florencia Reasons, MD  albuterol (PROVENTIL HFA;VENTOLIN HFA) 108 (90 BASE) MCG/ACT inhaler Inhale 2 puffs into the lungs every 6 (six) hours as needed for wheezing or shortness of breath. 07/07/15  Yes Debbrah Alar, NP  atorvastatin (LIPITOR) 20 MG tablet Take 20 mg by mouth daily with breakfast.    Yes [provider]  cetirizine (ZYRTEC) 10 MG tablet Take 10 mg by mouth daily with breakfast.   Yes [provider]  clopidogrel (PLAVIX) 75 MG tablet Take 75 mg daily by mouth.   Yes [provider]  escitalopram (LEXAPRO) 10 MG tablet TAKE ONE TABLET BY MOUTH ONCE DAILY IN THE MORNING 03/16/17  Yes [name], [name], MD  furosemide (LASIX) 20 MG tablet Take 1 tablet (20 mg total) by mouth daily. 06/05/17  Yes [name], [name], MD  ketoconazole (NIZORAL) 2 % cream APPLY AS NEEDED TO RASH Patient taking differently: Apply to groin or legs daily as needed for rash 10/23/16  Yes [name], [name], MD  Melatonin 3 MG TABS Take 3 mg by mouth at bedtime as needed (for sleep).   Yes [provider]  metoprolol tartrate (LOPRESSOR) 25 MG tablet Take 0.5 tablets (12.5 mg total) by mouth 2 (two) times daily. Hold if sbp less than 90 or heart rate less than 60. 06/02/17  Yes Florencia Reasons, MD  Multiple Vitamin (MULTIVITAMIN WITH MINERALS) TABS tablet Take 1 tablet by mouth daily with breakfast.    Yes [provider]  pantoprazole (PROTONIX) 40 MG tablet Take 1 tablet (40 mg total) by mouth 2 (two) times daily. 06/02/17 06/02/18 Yes Florencia Reasons, MD  saccharomyces boulardii (FLORASTOR) 250 MG capsule Take 1 capsule (250 mg total) by mouth 2 (two) times daily. 01/30/17  Yes Aline August, MD  sodium bicarbonate 650 MG tablet Take 1 tablet (650 mg total) by mouth daily. 06/02/17  Yes Florencia Reasons, MD  testosterone enanthate (DELATESTRYL) 200 MG/ML injection Inject 200 mg into the muscle every 14 (fourteen) days. For IM use only   Yes [provider]  traMADol (ULTRAM) 50 MG tablet Take 50 mg by mouth every 6 (six) hours as needed for moderate pain.   Yes [provider]  traZODone (DESYREL) 100 MG tablet TAKE ONE TABLET BY MOUTH AT BEDTIME 03/16/17  Yes [name], [name], MD  triamcinolone cream (KENALOG) 0.1 % Apply 1 application topically 2 (two) times daily as needed (for rash). Applies to face   Yes [provider]  ciprofloxacin (CIPRO) 500 MG tablet Take 1 tablet (500 mg total) by mouth 2 (two) times daily. Patient not taking: Reported on 06/15/2017 06/05/17   [name], MD    Family History Family History  Problem Relation Age of Onset  . Alcohol abuse Father   . Throat cancer Father   . Esophageal cancer Father 25  .  Arthritis Mother   . Hyperlipidemia Mother   . Uterine cancer Mother 3  . Colon cancer Brother   . Arthritis Maternal Grandmother   . Hypertension Brother   . Stroke Paternal Grandmother     Social History Social History   Tobacco Use  . Smoking status: Former Smoker    Packs/day: 0.50    Years: 23.00    Pack years: 11.50    Types: Cigarettes    Last attempt to quit: 09/24/1992    Years since quitting: 24.7  . Smokeless tobacco: Never Used  Substance Use Topics  . Alcohol use: No    Alcohol/week: 0.0 oz    Comment: rare  . Drug use: No     Allergies   Penicillins   Review of Systems Review of Systems  Constitutional: Positive for chills. Negative for fever.  Respiratory: Negative for shortness of breath.   Cardiovascular: Negative for chest pain.  Gastrointestinal: Positive for abdominal pain. Negative for abdominal distention, blood in stool, nausea and vomiting.       Decreased ostomy output.  All other systems reviewed and are negative.    Physical Exam Updated Vital Signs BP (!) 151/84 (BP Location: Right Arm)   Pulse 91   Temp 98.4 F (36.9 C) (Oral)   Resp 12   SpO2 97%   Physical Exam  Constitutional: He appears well-developed  and well-nourished. No distress.  HENT:  Head: Normocephalic and atraumatic.  Eyes: Conjunctivae are normal.  Neck: Neck supple.  Cardiovascular: Normal rate, regular rhythm, normal heart sounds and intact distal pulses.  Pulmonary/Chest: Effort normal and breath sounds normal. No respiratory distress.  Abdominal: Soft. There is tenderness in the right lower quadrant and left lower quadrant. There is no guarding.  Intermittent abdominal tenderness only present when patient indicates he is having pain.  Colostomy bag in place. Ostomy site inspected.  No abnormal tenderness noted.  No surrounding swelling. Minimal surrounding erythema.   Musculoskeletal: He exhibits no edema.  Lymphadenopathy:    He has no cervical adenopathy.  Neurological: He is alert.  Skin: Skin is warm and dry. He is not diaphoretic.  Psychiatric: He has a normal mood and affect. His behavior is normal.  Nursing note and vitals reviewed.        ED Treatments / Results  Labs (all labs ordered are listed, but only abnormal results are displayed) Labs Reviewed  COMPREHENSIVE METABOLIC PANEL - Abnormal; Notable for the following components:      Result Value   Creatinine, Ser 1.38 (*)    Calcium 8.2 (*)    Albumin 2.6 (*)    ALT 16 (*)    GFR calc non Af Amer 53 (*)    All other components within normal limits  CBC - Abnormal; Notable for the following components:   WBC 11.2 (*)    RBC 3.54 (*)    Hemoglobin 9.4 (*)    HCT 30.7 (*)    RDW 17.5 (*)    All other components within normal limits  LIPASE, BLOOD  BASIC METABOLIC PANEL  CBC    EKG  EKG Interpretation None       Radiology No results found.  Procedures Procedures (including critical care time)  Medications Ordered in ED Medications  acetaminophen (TYLENOL) tablet 500 mg (not administered)  atorvastatin (LIPITOR) tablet 20 mg (not administered)  loratadine (CLARITIN) tablet 10 mg (not administered)  escitalopram (LEXAPRO) tablet  10 mg (not administered)  ketoconazole (NIZORAL) 2 % cream (not administered)  multivitamin with minerals tablet 1 tablet (not administered)  saccharomyces boulardii (FLORASTOR) capsule 250 mg (not administered)  traZODone (DESYREL) tablet 100 mg (not administered)  ondansetron (ZOFRAN) injection 4 mg (not administered)  pantoprazole (PROTONIX) injection 40 mg (not administered)  0.9 %  sodium chloride infusion (not administered)  morphine 4 MG/ML injection 4 mg (4 mg Intravenous Given 06/15/17 1424)  sodium chloride 0.9 % bolus 1,000 mL (1,000 mLs Intravenous New Bag/Given 06/15/17 1547)     Initial Impression / Assessment and Plan / ED Course  I have reviewed the triage vital signs and the nursing notes.  Pertinent labs & imaging results that were available during my care of the patient were reviewed by me and considered in my medical decision making (see chart for details).  Clinical Course as of Jun 16 1719  Mon Jun 15, 2017  1436 Spoke with Jackson Latino, PA-C, on call for general surgery. Requests that we attempt to "digitize" the ostomy by pushing the pinky into the ostomy using steady pressure.  If this is unsuccessful, the colostomy nurse will also attempt this.  If all attempts are unsuccessful, patient will likely have to return to the OR.  [SJ]  1436 Attempted digitizing the ostomy twice.  Was unsuccessful with both attempts.  [SJ]  1500 Spoke with Janett Billow again. States she attempted to digitize the ostomy, but was unsuccessful. Requests we admit via medicine for patient's comorbidities.  [SJ]  1519 Spoke with Dr. Carolin Sicks, hospitalist. States he will do the admission.   [SJ]    Clinical Course User Index [SJ] Joy, Shawn C, PA-C    Patient presents with abdominal pain and decreased colostomy output over the past few days.  Ostomy opening appears to be closed.  AKI also noted and addressed.  Patient admitted and will undergo surgical evaluation.  Findings and plan of care  discussed with Theotis Burrow, MD. Dr. Rex Kras personally evaluated and examined this patient.   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

## 2017-06-15 NOTE — ED Notes (Signed)
[place] IV team at patient bedside

## 2017-06-15 NOTE — ED Notes (Signed)
Pt had drawn for labs: Lt green Dark green [facility] blood culture

## 2017-06-15 NOTE — Progress Notes (Signed)
PHARMACIST - PHYSICIAN ORDER COMMUNICATION  CONCERNING: P&T Medication Policy on Herbal Medications  DESCRIPTION:  This patient's order for:  Melatonin  has been noted.  This product(s) is classified as an "herbal" or natural product. Due to a lack of definitive safety studies or FDA approval, nonstandard manufacturing practices, plus the potential risk of unknown drug-drug interactions while on inpatient medications, the Pharmacy and Therapeutics Committee does not permit the use of "herbal" or natural products of this type within [facility].   ACTION TAKEN: The pharmacy department is unable to verify this order at this time and your patient has been informed of this safety policy. Please reevaluate patient's clinical condition at discharge and address if the herbal or natural product(s) should be resumed at that time.  Leone Haven, PharmD

## 2017-06-16 ENCOUNTER — Other Ambulatory Visit: Payer: Self-pay

## 2017-06-16 ENCOUNTER — Encounter (HOSPITAL_COMMUNITY): Payer: Self-pay | Admitting: Surgery

## 2017-06-16 ENCOUNTER — Ambulatory Visit: Payer: PPO | Admitting: Family Medicine

## 2017-06-16 DIAGNOSIS — I69365 Other paralytic syndrome following cerebral infarction, bilateral: Secondary | ICD-10-CM | POA: Diagnosis not present

## 2017-06-16 DIAGNOSIS — Y833 Surgical operation with formation of external stoma as the cause of abnormal reaction of the patient, or of later complication, without mention of misadventure at the time of the procedure: Secondary | ICD-10-CM | POA: Diagnosis present

## 2017-06-16 DIAGNOSIS — Z880 Allergy status to penicillin: Secondary | ICD-10-CM | POA: Diagnosis not present

## 2017-06-16 DIAGNOSIS — Z7902 Long term (current) use of antithrombotics/antiplatelets: Secondary | ICD-10-CM | POA: Diagnosis not present

## 2017-06-16 DIAGNOSIS — Z8249 Family history of ischemic heart disease and other diseases of the circulatory system: Secondary | ICD-10-CM | POA: Diagnosis not present

## 2017-06-16 DIAGNOSIS — E785 Hyperlipidemia, unspecified: Secondary | ICD-10-CM | POA: Diagnosis present

## 2017-06-16 DIAGNOSIS — N183 Chronic kidney disease, stage 3 (moderate): Secondary | ICD-10-CM | POA: Diagnosis present

## 2017-06-16 DIAGNOSIS — Z905 Acquired absence of kidney: Secondary | ICD-10-CM | POA: Diagnosis not present

## 2017-06-16 DIAGNOSIS — K9409 Other complications of colostomy: Secondary | ICD-10-CM | POA: Diagnosis present

## 2017-06-16 DIAGNOSIS — K9403 Colostomy malfunction: Secondary | ICD-10-CM | POA: Diagnosis present

## 2017-06-16 DIAGNOSIS — Z800 Family history of malignant neoplasm of digestive organs: Secondary | ICD-10-CM | POA: Diagnosis not present

## 2017-06-16 DIAGNOSIS — C800 Disseminated malignant neoplasm, unspecified: Secondary | ICD-10-CM | POA: Diagnosis present

## 2017-06-16 DIAGNOSIS — I129 Hypertensive chronic kidney disease with stage 1 through stage 4 chronic kidney disease, or unspecified chronic kidney disease: Secondary | ICD-10-CM | POA: Diagnosis present

## 2017-06-16 DIAGNOSIS — Z9049 Acquired absence of other specified parts of digestive tract: Secondary | ICD-10-CM | POA: Diagnosis not present

## 2017-06-16 DIAGNOSIS — Z808 Family history of malignant neoplasm of other organs or systems: Secondary | ICD-10-CM | POA: Diagnosis not present

## 2017-06-16 DIAGNOSIS — G8250 Quadriplegia, unspecified: Secondary | ICD-10-CM | POA: Diagnosis present

## 2017-06-16 DIAGNOSIS — E1122 Type 2 diabetes mellitus with diabetic chronic kidney disease: Secondary | ICD-10-CM | POA: Diagnosis present

## 2017-06-16 DIAGNOSIS — Z85520 Personal history of malignant carcinoid tumor of kidney: Secondary | ICD-10-CM | POA: Diagnosis not present

## 2017-06-16 DIAGNOSIS — Z823 Family history of stroke: Secondary | ICD-10-CM | POA: Diagnosis not present

## 2017-06-16 DIAGNOSIS — E291 Testicular hypofunction: Secondary | ICD-10-CM | POA: Diagnosis present

## 2017-06-16 DIAGNOSIS — Z87891 Personal history of nicotine dependence: Secondary | ICD-10-CM | POA: Diagnosis not present

## 2017-06-16 DIAGNOSIS — Z8049 Family history of malignant neoplasm of other genital organs: Secondary | ICD-10-CM | POA: Diagnosis not present

## 2017-06-16 DIAGNOSIS — Z86010 Personal history of colonic polyps: Secondary | ICD-10-CM | POA: Diagnosis not present

## 2017-06-16 DIAGNOSIS — Z8349 Family history of other endocrine, nutritional and metabolic diseases: Secondary | ICD-10-CM | POA: Diagnosis not present

## 2017-06-16 LAB — CBC
HCT: 24.5 % — ABNORMAL LOW (ref 39.0–52.0)
HEMATOCRIT: 24.3 % — AB (ref 39.0–52.0)
HEMOGLOBIN: 7.4 g/dL — AB (ref 13.0–17.0)
HEMOGLOBIN: 7.5 g/dL — AB (ref 13.0–17.0)
MCH: 25.9 pg — ABNORMAL LOW (ref 26.0–34.0)
MCH: 26.3 pg (ref 26.0–34.0)
MCHC: 30.2 g/dL (ref 30.0–36.0)
MCHC: 30.9 g/dL (ref 30.0–36.0)
MCV: 85.7 fL (ref 78.0–100.0)
MCV: 85.3 fL (ref 78.0–100.0)
PLATELETS: 360 10*3/uL (ref 150–400)
Platelets: 355 10*3/uL (ref 150–400)
RBC: 2.86 MIL/uL — ABNORMAL LOW (ref 4.22–5.81)
RBC: 2.85 MIL/uL — AB (ref 4.22–5.81)
RDW: 17.5 % — ABNORMAL HIGH (ref 11.5–15.5)
RDW: 17.4 % — ABNORMAL HIGH (ref 11.5–15.5)
WBC: 7.7 10*3/uL (ref 4.0–10.5)
WBC: 7.3 10*3/uL (ref 4.0–10.5)

## 2017-06-16 LAB — BASIC METABOLIC PANEL
ANION GAP: 12 (ref 5–15)
BUN: 16 mg/dL (ref 6–20)
CALCIUM: 7.7 mg/dL — AB (ref 8.9–10.3)
CO2: 22 mmol/L (ref 22–32)
CREATININE: 1.33 mg/dL — AB (ref 0.61–1.24)
Chloride: 105 mmol/L (ref 101–111)
GFR calc non Af Amer: 56 mL/min — ABNORMAL LOW (ref >60)
GLUCOSE: 81 mg/dL (ref 65–99)
Potassium: 3.8 mmol/L (ref 3.5–5.1)
Sodium: 139 mmol/L (ref 135–145)

## 2017-06-16 LAB — GLUCOSE, CAPILLARY: Glucose-Capillary: 112 mg/dL — ABNORMAL HIGH (ref 65–99)

## 2017-06-16 MED ORDER — POLYETHYLENE GLYCOL 3350 17 G PO PACK
17.0000 g | PACK | Freq: Two times a day (BID) | ORAL | Status: DC
  Administered 2017-06-16 – 2017-06-18 (×4): 17 g via ORAL
  Filled 2017-06-16 (×4): qty 1

## 2017-06-16 MED ORDER — ENSURE PRE-SURGERY PO LIQD
296.0000 mL | Freq: Once | ORAL | Status: DC
  Filled 2017-06-16: qty 296

## 2017-06-16 MED ORDER — MORPHINE SULFATE (PF) 2 MG/ML IV SOLN: 2.0000 mg | INTRAVENOUS | Status: DC | PRN

## 2017-06-16 NOTE — Progress Notes (Signed)
[address] [place] [name], [place] [address] Phone: [phone]  FAX: [phone]      SRIANSH [name] [id number] July 21, 1956  CARE TEAM:  PCP: [name], MD  Outpatient Care Team: Patient Care Team: [name], MD as PCP - General Alen Blew, Mathis Dad, MD as Consulting Physician (Oncology) Pyrtle, Lajuan Lines, MD as Consulting Physician (Gastroenterology) Jon Billings, RN as [facility] Alphonsa Overall, MD as Consulting Physician (General Surgery)  Inpatient Treatment Team: Treatment Team: Attending Provider: Edison Pace, Md, MD; Consulting Physician: Alphonsa Overall, MD; Registered Nurse: Fransisca Connors, RN; Attending Physician: Edison Pace Md, MD   Problem List:   Active Problems:   Renal carcinoma metastatic with carcinomatosis   Quadriplegia from brainstem stroke   Essential hypertension   Type 2 diabetes mellitus ([place])   Hyperlipidemia   Perforation of sigmoid colon due to diverticulitis s/p colostomy 9/18   Colostomy in place    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  Plan:  -I was able to pass a 65m x 10 mm blunt toothbrush tip into the stoma and across the fascia 10 cm deep quite easily.  He has has liquid stool coming in the bag.  I think it is reasonable to try some Hegar dilation since it seems to be a short segment stricture.  It may not work, but it has been successful in my experience a few times.  The patient would like to avoid operation.  Wife is skeptical and concerned that it is going to need surgery at some point.  We will see.  If he cannot tolerate p.o. with bloating and I cannot get this dilated in the next few days, proceed with colostomy revision later this week.  Most likely would require splenic flexure mobilization to give enough mobility.  -VTE prophylaxis- SCDs, etc -mobilize as tolerated to help  recovery  30 minutes spent in review, evaluation, examination, counseling, and coordination of care.  More than 50% of that time was spent in counseling.  SAdin Hector M.D., F.A.C.S. Gastrointestinal and Minimally Invasive [facility] [address] [place]  [phone] / Paging   06/16/2017    Subjective: (Chief complaint)  Wife at bedside.  Starting to have liquid stool in the bag last night and some this morning.  Wife worried that dilations will work since wound ostomy nurse and gastroneurology are skeptical that it will.  Objective:  Vital signs:  Vitals:   06/15/17 2305 06/16/17 0620 06/16/17 0900 06/16/17 0956  BP: (!) 110/43 (!) 118/59 (!) 143/77   Pulse: 91 88 82   Resp: '16 16 16   ' Temp: 99.2 F (37.3 C) 98 F (36.7 C) 98.7 F (37.1 C)   TempSrc: Oral Oral Oral   SpO2: 96% 96% 97%   Weight:    76.4 kg (168 lb 8 oz)       Intake/Output   Yesterday:  No intake/output data recorded. This shift:  Total I/O In: -  Out: 475 [Urine:75; Stool:400]  Bowel function:  Flatus: YES  BM:  YES - liquid stool  Drain: (No drain)   Physical Exam:  General: Pt awake/alert/oriented x4 in no acute distress Eyes: PERRL, normal EOM.  Sclera clear.  No icterus Neuro: CN II-XII intact w/o focal sensory/motor deficits. Lymph: No head/neck/groin lymphadenopathy Psych:  No delerium/psychosis/paranoia.  Mentally sharp and oriented HENT: Normocephalic, Mucus membranes moist.  No thrush.  Again can mainly mouth or whisper words. Neck: Supple, No tracheal deviation Chest: No chest wall pain w good excursion CV:  Pulses intact.  Regular rhythm MS: Normal AROM mjr joints.  No obvious deformity  Abdomen: Soft.  Mildy distended.  Nontender.  No evidence of peritonitis.  No incarcerated hernias.  Pink narrow colostomy.  Flatus and liquid stool in bag.  I was able to intubate across it with a 10 x 5 mm  smooth toothbrush stitch used as a bedside dilator.  Tolerated well. 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  Stable.  No mjr edema.  No cyanosis Skin: No petechiae / purpura  Results:   Labs: Results for orders placed or performed during the hospital encounter of 06/15/17 (from the past 48 hour(s))  Lipase, blood     Status: None   Collection Time: 06/15/17  2:14 PM  Result Value Ref Range   Lipase 19 11 - 51 U/L  Comprehensive metabolic panel     Status: Abnormal   Collection Time: 06/15/17  2:14 PM  Result Value Ref Range   Sodium 139 135 - 145 mmol/L   Potassium 4.1 3.5 - 5.1 mmol/L   Chloride 103 101 - 111 mmol/L   CO2 24 22 - 32 mmol/L   Glucose, Bld 75 65 - 99 mg/dL   BUN 14 6 - 20 mg/dL   Creatinine, Ser 1.38 (H) 0.61 - 1.24 mg/dL   Calcium 8.2 (L) 8.9 - 10.3 mg/dL   Total Protein 7.2 6.5 - 8.1 g/dL   Albumin 2.6 (L) 3.5 - 5.0 g/dL   AST 25 15 - 41 U/L   ALT 16 (L) 17 - 63 U/L   Alkaline Phosphatase 67 38 - 126 U/L   Total Bilirubin 0.9 0.3 - 1.2 mg/dL   GFR calc non Af Amer 53 (L) >60 mL/min   GFR calc Af Amer >60 >60 mL/min    Comment: (NOTE) The eGFR has been calculated using the CKD EPI equation. This calculation has not been validated in all clinical situations. eGFR's persistently <60 mL/min signify possible Chronic Kidney Disease.    Anion gap 12 5 - 15  CBC     Status: Abnormal   Collection Time: 06/15/17  2:14 PM  Result Value Ref Range   WBC 11.2 (H) 4.0 - 10.5 K/uL   RBC 3.54 (L) 4.22 - 5.81 MIL/uL   Hemoglobin 9.4 (L) 13.0 - 17.0 g/dL   HCT 30.7 (L) 39.0 - 52.0 %   MCV 86.7 78.0 - 100.0 fL   MCH 26.6 26.0 - 34.0 pg   MCHC 30.6 30.0 - 36.0 g/dL   RDW 17.5 (H) 11.5 - 15.5 %   Platelets 367 150 - 400 K/uL  Basic metabolic panel     Status: Abnormal   Collection Time: 06/16/17  5:37 AM  Result Value Ref Range   Sodium 139 135 - 145 mmol/L   Potassium 3.8 3.5 - 5.1 mmol/L   Chloride 105 101 - 111 mmol/L   CO2 22 22 - 32 mmol/L    Glucose, Bld 81 65 - 99 mg/dL   BUN 16 6 - 20 mg/dL   Creatinine, Ser 1.33 (H) 0.61 - 1.24 mg/dL   Calcium 7.7 (L) 8.9 - 10.3 mg/dL   GFR calc non Af Amer 56 (L) >60 mL/min   GFR calc Af Amer >60 >60 mL/min    Comment: (NOTE) The  eGFR has been calculated using the CKD EPI equation. This calculation has not been validated in all clinical situations. eGFR's persistently <60 mL/min signify possible Chronic Kidney Disease.    Anion gap 12 5 - 15  CBC     Status: Abnormal   Collection Time: 06/16/17  5:37 AM  Result Value Ref Range   WBC 7.7 4.0 - 10.5 K/uL   RBC 2.86 (L) 4.22 - 5.81 MIL/uL   Hemoglobin 7.4 (L) 13.0 - 17.0 g/dL    Comment: REPEATED TO VERIFY DELTA CHECK NOTED    HCT 24.5 (L) 39.0 - 52.0 %   MCV 85.7 78.0 - 100.0 fL   MCH 25.9 (L) 26.0 - 34.0 pg   MCHC 30.2 30.0 - 36.0 g/dL   RDW 17.5 (H) 11.5 - 15.5 %   Platelets 360 150 - 400 K/uL  CBC     Status: Abnormal   Collection Time: 06/16/17  8:06 AM  Result Value Ref Range   WBC 7.3 4.0 - 10.5 K/uL   RBC 2.85 (L) 4.22 - 5.81 MIL/uL   Hemoglobin 7.5 (L) 13.0 - 17.0 g/dL   HCT 24.3 (L) 39.0 - 52.0 %   MCV 85.3 78.0 - 100.0 fL   MCH 26.3 26.0 - 34.0 pg   MCHC 30.9 30.0 - 36.0 g/dL   RDW 17.4 (H) 11.5 - 15.5 %   Platelets 355 150 - 400 K/uL  Glucose, capillary     Status: Abnormal   Collection Time: 06/16/17 12:16 PM  Result Value Ref Range   Glucose-Capillary 112 (H) 65 - 99 mg/dL    Imaging / Studies: No results found.  Medications / Allergies: per chart  Antibiotics: Anti-infectives (From admission, onward)   None        Note: Portions of this report may have been transcribed using voice recognition software. Every effort was made to ensure accuracy; however, inadvertent computerized transcription errors may be present.   Any transcriptional errors that result from this process are unintentional.     Adin Hector, M.D., F.A.C.S. Gastrointestinal and Minimally Invasive [facility] [address] [place],  [address] [phone] Main / Paging   06/16/2017

## 2017-06-16 NOTE — Patient Outreach (Signed)
[facility]) Care Management  06/16/2017  Austin [name] 10/27/1955 [id number]   Patient admitted to the hospital. Hospital liaison has been in contact with patient.  Patient would like to continue with telephonic once discharged.  Plan: RN CM will wait patient discharge.    [name], RN, MSN [facility] Care Management Care Management Coordinator Direct Line [phone] Toll Free: [phone]  Fax: [phone]

## 2017-06-16 NOTE — Consult Note (Signed)
   [facility] Inpatient Consult   06/16/2017  Austin [name] January 26, 1956 [id number]    Austin [name] recently signed up with [place] [facility] program during last hospitalization. Please see chart review tab then encounters for further patient outreach details.  Spoke with Austin [name] and wife at bedside. Both endorse that they are agreeable to ongoing [place] Management services. Austin [name] does not want home visits. He wishes to continue with Telephonic [facility] Care Management RNCM follow up.   Will continue to follow.   Provided contact information.   Made inpatient RNCM aware that [place] Management is active.   Will update [facility] Telephonic RNCM.    Marthenia Rolling, MSN-Ed, RN,BSN [facility] Liaison [phone]

## 2017-06-16 NOTE — Consult Note (Signed)
[place] Nurse ostomy consult note Stoma type/location: Patient well known to me; I have been in touch with them regarding stenosing and retracting stoma several times since discharge. Patient and wife report that they saw Dr. Lucia Gaskins last pm and that they discussed the surgical options with him. Stomal assessment/size: <3/4 inch and we are unable to insert even a 5th digit. Severe stenosis and retraction. Peristomal assessment: not seen today; clear according to wife Treatment options for stomal/peristomal skin:  Output: liquid dark brown effluent.  Parastomal area is less firm.   Ostomy pouching: 1pc.convex pouch with skin barrier ring Education provided: Discussed with patient and wife at length my experience with stoma dilatations (Hegar or otherwise); eventually the ostomy will stenose and the aperture becomes rigid with scar tissue, unable to allow passage of any formed stool. At most, it could buy [place] a week or two with varying degrees of patient discomfort and dietary alterations. I feel the more prudent approach is a stoma revision when medically stable/approved by surgical team. I will remain available for marking, education for family and of course management of the existing or a new stoma. Enrolled patient in [place] program: Yes, previously  [place] nursing team will follow, and will remain available to this patient, the nursing and medical teams.  Thanks, Maudie Flakes, MSN, RN, [place], Arther Abbott  Pager# [phone]

## 2017-06-16 NOTE — Progress Notes (Signed)
Discussed with General surgery team Janett Billow), they will be the primary team and follow up lab result including CBC. Further care as per surgery team. I will sign off. Please call us back with any question.   Elliemae Braman.

## 2017-06-17 ENCOUNTER — Ambulatory Visit: Payer: Self-pay

## 2017-06-17 LAB — CBC
HEMATOCRIT: 23.2 % — AB (ref 39.0–52.0)
Hemoglobin: 7.2 g/dL — ABNORMAL LOW (ref 13.0–17.0)
MCH: 26.5 pg (ref 26.0–34.0)
MCHC: 31 g/dL (ref 30.0–36.0)
MCV: 85.3 fL (ref 78.0–100.0)
Platelets: 305 10*3/uL (ref 150–400)
RBC: 2.72 MIL/uL — ABNORMAL LOW (ref 4.22–5.81)
RDW: 17.3 % — AB (ref 11.5–15.5)
WBC: 8.7 10*3/uL (ref 4.0–10.5)

## 2017-06-17 LAB — BASIC METABOLIC PANEL
Anion gap: 7 (ref 5–15)
BUN: 9 mg/dL (ref 6–20)
CHLORIDE: 108 mmol/L (ref 101–111)
CO2: 24 mmol/L (ref 22–32)
Calcium: 7.8 mg/dL — ABNORMAL LOW (ref 8.9–10.3)
Creatinine, Ser: 1.11 mg/dL (ref 0.61–1.24)
GFR calc Af Amer: >60 mL/min (ref >60)
GLUCOSE: 112 mg/dL — AB (ref 65–99)
POTASSIUM: 3.9 mmol/L (ref 3.5–5.1)
Sodium: 139 mmol/L (ref 135–145)

## 2017-06-17 LAB — GLUCOSE, CAPILLARY
GLUCOSE-CAPILLARY: 127 mg/dL — AB (ref 65–99)
Glucose-Capillary: 128 mg/dL — ABNORMAL HIGH (ref 65–99)
Glucose-Capillary: 137 mg/dL — ABNORMAL HIGH (ref 65–99)

## 2017-06-17 MED ORDER — SODIUM CHLORIDE 0.9% FLUSH
3.0000 mL | Freq: Two times a day (BID) | INTRAVENOUS | Status: DC
  Administered 2017-06-17 (×2): 3 mL via INTRAVENOUS

## 2017-06-17 MED ORDER — SODIUM CHLORIDE 0.9% FLUSH: 3.0000 mL | INTRAVENOUS | Status: DC | PRN

## 2017-06-17 MED ORDER — LACTATED RINGERS IV BOLUS (SEPSIS)
1000.0000 mL | Freq: Once | INTRAVENOUS | Status: AC
  Administered 2017-06-17: 1000 mL via INTRAVENOUS

## 2017-06-17 MED ORDER — SODIUM CHLORIDE 0.9 % IV SOLN: 250.0000 mL | INTRAVENOUS | Status: DC | PRN

## 2017-06-18 ENCOUNTER — Encounter: Payer: Self-pay | Admitting: Radiology

## 2017-06-18 LAB — BASIC METABOLIC PANEL
Anion gap: 7 (ref 5–15)
BUN: 11 mg/dL (ref 6–20)
CALCIUM: 8 mg/dL — AB (ref 8.9–10.3)
CO2: 24 mmol/L (ref 22–32)
Chloride: 109 mmol/L (ref 101–111)
Creatinine, Ser: 1.25 mg/dL — ABNORMAL HIGH (ref 0.61–1.24)
GFR calc Af Amer: >60 mL/min (ref >60)
Glucose, Bld: 100 mg/dL — ABNORMAL HIGH (ref 65–99)
POTASSIUM: 3.8 mmol/L (ref 3.5–5.1)
SODIUM: 140 mmol/L (ref 135–145)

## 2017-06-18 LAB — CBC
HEMATOCRIT: 23.8 % — AB (ref 39.0–52.0)
Hemoglobin: 7.4 g/dL — ABNORMAL LOW (ref 13.0–17.0)
MCH: 26.7 pg (ref 26.0–34.0)
MCHC: 31.1 g/dL (ref 30.0–36.0)
MCV: 85.9 fL (ref 78.0–100.0)
PLATELETS: 350 10*3/uL (ref 150–400)
RBC: 2.77 MIL/uL — ABNORMAL LOW (ref 4.22–5.81)
RDW: 17.6 % — AB (ref 11.5–15.5)
WBC: 9.3 10*3/uL (ref 4.0–10.5)

## 2017-06-18 LAB — GLUCOSE, CAPILLARY
GLUCOSE-CAPILLARY: 112 mg/dL — AB (ref 65–99)
GLUCOSE-CAPILLARY: 82 mg/dL (ref 65–99)

## 2017-06-18 NOTE — Progress Notes (Signed)
Discharge instructions reviewed with patient and wife. All questions answered. Patient accompanied by nurse tech to vehicle with belongings.

## 2017-06-18 NOTE — Progress Notes (Signed)
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  She was taught how to dilate colostomy with dilators and her finger.  Discharge today.  Follow up with Dr. Lucia Gaskins Wednesday, November 14.   LOS: 2 days        Chief Complaint/Subjective: Tolerating diet.  Colostomy working well now.  Wife in room.  Objective: Vital signs in last 24 hours: Temp:  [97.8 F (36.6 C)-98.7 F (37.1 C)] 98.6 F (37 C) (11/08 0612) Pulse Rate:  [63-73] 73 (11/08 0936) Resp:  [16-17] 16 (11/08 0612) BP: (127-141)/(47-70) 138/70 (11/08 0936) SpO2:  [96 %-99 %] 96 % (11/08 0612) Last BM Date: 06/17/17  Intake/Output from previous day: 11/07 0701 - 11/08 0700 In: 1440 [P.O.:1440] Out: 2800 [Urine:2100; Stool:700] Intake/Output this shift: No intake/output data recorded.  PE: General- In NAD.  Awake and alert. Abdomen-soft, left sided colostomy functioning.  Tight granulation tissue around stoma.  Dilated with metal dilators and finger at bedside.  Lab Results:  Recent Labs    06/17/17 0934 06/18/17 0437  WBC 8.7 9.3  HGB 7.2* 7.4*  HCT 23.2* 23.8*  PLT 305 350   BMET Recent Labs    06/17/17 0934 06/18/17 0437  NA 139 140  K 3.9 3.8  CL 108 109  CO2 24 24  GLUCOSE 112* 100*  BUN 9 11  CREATININE 1.11 1.25*  CALCIUM 7.8* 8.0*   PT/INR No results for input(s): LABPROT, INR in the last 72 hours. Comprehensive Metabolic Panel:    Component Value Date/Time   NA 140 06/18/2017 0437   NA 139 06/17/2017 0934   NA 138 03/13/2017 0922   NA 135 (L) 11/11/2016 0913   K 3.8 06/18/2017 0437   K 3.9 06/17/2017 0934   K 4.6  03/13/2017 0922   K 4.6 11/11/2016 0913   CL 109 06/18/2017 0437   CL 108 06/17/2017 0934   CO2 24 06/18/2017 0437   CO2 24 06/17/2017 0934   CO2 24 03/13/2017 0922   CO2 17 (L) 11/11/2016 0913   BUN 11 06/18/2017 0437   BUN 9 06/17/2017 0934   BUN 27.4 (H) 03/13/2017 0922   BUN 42.8 (H) 11/11/2016 0913   CREATININE 1.25 (H) 06/18/2017 0437   CREATININE 1.11 06/17/2017 0934   CREATININE 0.87 06/05/2017 1643   CREATININE 1.3 03/13/2017 0922   CREATININE 1.32 (H) 02/06/2017 1605   CREATININE 1.5 (H) 11/11/2016 0913   GLUCOSE 100 (H) 06/18/2017 0437   GLUCOSE 112 (H) 06/17/2017 0934   GLUCOSE 91 03/13/2017 0922   GLUCOSE 105 11/11/2016 0913   CALCIUM 8.0 (L) 06/18/2017 0437   CALCIUM 7.8 (L) 06/17/2017 0934   CALCIUM 9.3 03/13/2017 0922   CALCIUM 9.4 11/11/2016 0913   AST 25 06/15/2017 1414   AST 16 05/28/2017 0759   AST 17 03/13/2017 0922   AST 14 11/11/2016 0913   ALT 16 (L) 06/15/2017 1414   ALT 31 05/28/2017 0759   ALT 21 03/13/2017 0922   ALT 18 11/11/2016 0913   ALKPHOS 67 06/15/2017 1414   ALKPHOS 70 05/28/2017 0759   ALKPHOS 91 03/13/2017 0922   ALKPHOS 63 11/11/2016  0913   BILITOT 0.9 06/15/2017 1414   BILITOT 0.5 05/28/2017 0759   BILITOT 0.27 03/13/2017 0922   BILITOT <0.22 11/11/2016 0913   PROT 7.2 06/15/2017 1414   PROT 7.1 05/28/2017 0759   PROT 7.9 03/13/2017 0922   PROT 7.7 11/11/2016 0913   ALBUMIN 2.6 (L) 06/15/2017 1414   ALBUMIN 2.9 (L) 05/28/2017 0759   ALBUMIN 3.0 (L) 03/13/2017 0922   ALBUMIN 3.1 (L) 11/11/2016 0913     Studies/Results: No results found.  Anti-infectives: Anti-infectives (From admission, onward)   None       Edie Darley J 06/18/2017

## 2017-06-18 NOTE — Discharge Instructions (Addendum)
Usual diet.  Use MiraLax as needed.  Dilate colostomy with dilators and fingers as discussed.  Appointment with Dr. Lucia Gaskins, Wednesday November 14 at noon.  Please show up 15 minutes early.  Call for further problems.

## 2017-06-18 NOTE — Consult Note (Signed)
   [facility] Inpatient Consult   06/18/2017  SPURGEON GANCARZ 30-Sep-1955 [id number]    [facility] Care Management follow up.  Noted Mr. Wierzbicki discharged home.   Re-referred for [place] to contact patient post discharge.  Of note, Mr. Cooks and wife requested Telephonic follow up from [place] Management not home visits.   Marthenia Rolling, MSN-Ed, RN,BSN [facility] Liaison [phone]

## 2017-06-19 ENCOUNTER — Other Ambulatory Visit: Payer: Self-pay

## 2017-06-19 ENCOUNTER — Telehealth: Payer: Self-pay | Admitting: *Deleted

## 2017-06-19 NOTE — Telephone Encounter (Signed)
Transition Care Management Follow-up Telephone Call  Call completed w/ patient's wife, Peter Congo (on [place]).    Date discharged? 06/18/17   How have you been since you were released from the hospital? "He's been fine. He's tired of course."   Do you understand why you were in the hospital? yes   Do you understand the discharge instructions? yes   Where were you discharged to? Home   Items Reviewed:  Medications reviewed: yes  Allergies reviewed: yes  Dietary changes reviewed: yes  Referrals reviewed: no, none made per pt's wife   Functional Questionnaire:   Activities of Daily Living (ADLs):   He states they are independent in the following: feeding States they require assistance with the following: ambulation, bathing and hygiene, continence, grooming, toileting, dressing and Patient is quadriplegic and wife is caregiver.   Any transportation issues/concerns?: no   Any patient concerns? no   Confirmed importance and date/time of follow-up visits scheduled yes  Provider Appointment booked with Dr. [name] 06/18/17 @ 11:15am. This appointment was scheduled prior to hospitalization and wife declined to schedule sooner appointment w/ PCP due to family coming in for Thanksgiving.  Patient is seeing Dr. Lucia Gaskins next week for follow-up.  Confirmed with patient if condition begins to worsen call PCP or go to the ER.  Patient was given the office number and encouraged to call back with question or concerns.  : yes

## 2017-06-19 NOTE — Patient Outreach (Addendum)
[facility]) Care Management  06/19/2017  SHERI PROWS [birth year] [id number]   Referral Date: 06/19/17 Referral Source:  Hospital Liaison Date of Admission: 06/15/17 Diagnosis: Colostomy Stricture Date of Discharge: 06/18/17 Facility: [place]: HTA  Outreach attempt #1 Spoke with wife/patient she reports that patient is doing ok since being home.  She reports that she has to dilate stoma daily with her pinky finger or the dilator.  She reports she has ordered the dilator and it is to be delivered tomorrow.  Wife reports that she is ok with the dilation if it is going to prevent him from having surgery again. She reports that colostomy is working well.  Discussed with wife signs of worsening. She verbalized understanding and knows to seek help.  Discussed patient day to day care and states that she is ok but is realistic if his care gets too much for her.    Social: Patient lives with spouse who is primary care giver as patient is a quadriplegic due to previous stroke.    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  Appointments: Patient has an appointment with Dr. Lucia Gaskins on 06/24/17  Patient has appointment with primary care physician on 11/28-18.    Consent: RN CM reviewed [facility] services with patient/caregiver. Patient/caregiver gave verbal consent for services.  Written consent in chart and wife reports that they have other information on [facility].    Plan: [facility] CM Care Plan Problem One     Most Recent Value  Care Plan Problem One  at risk for readmission   Role Documenting the Problem One  Care Management Telephonic [place] for Problem One  Active  [facility] Long Term Goal   Patient will not be readmitted within the next 31 days.  THN Long Term Goal Start Date  06/19/17  Interventions for Problem One Long Term Goal  Reviewed signs of obstruction and notifying provider. Encouraged to call RN CM as needed  THN CM Short Term Goal #1   Patient/caregiver will keep follow up appointmet within the next 1 week.  THN CM Short Term Goal #1 Start Date  06/19/17  Interventions for Short Term Goal #1  Patient to see Dr. Lucia Gaskins on 06/24/17  THN CM Short Term Goal #2   Patient/ caregiver will know signs of GI bleeding within 30 days.  THN CM Short Term Goal #2 Start Date  06/19/17  Interventions for Short Term Goal #2  Reviewed signs of GI obstruction and notifying physician.       RN CM will send physician barriers letter and other assessment information.   RN CM will contact caregiver/patient next week for weekly call and caregiver/patient agrees to next outreach.     [name], RN, MSN [facility] Care Management Care Management Coordinator Direct Line [phone] Toll Free: [phone]  Fax: [phone]

## 2017-06-22 ENCOUNTER — Telehealth: Payer: Self-pay

## 2017-06-22 ENCOUNTER — Other Ambulatory Visit (INDEPENDENT_AMBULATORY_CARE_PROVIDER_SITE_OTHER): Payer: PPO

## 2017-06-22 DIAGNOSIS — R300 Dysuria: Secondary | ICD-10-CM

## 2017-06-22 LAB — POCT URINALYSIS DIPSTICK
Bilirubin, UA: NEGATIVE
GLUCOSE UA: NEGATIVE
Ketones, UA: NEGATIVE
NITRITE UA: NEGATIVE
Spec Grav, UA: 1.015 (ref 1.010–1.025)
UROBILINOGEN UA: 0.2 U/dL
pH, UA: 6.5 (ref 5.0–8.0)

## 2017-06-22 NOTE — Discharge Summary (Signed)
Physician Discharge Summary  Patient ID: Austin [name] MRN: [id number] DOB/AGE: 1956/04/19 61 y.o.  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 The colostomy began working again and he improved with hydration as well as taking an oral diet. His wife was taught how to dilate the colostomy. By his discharge date pregnancies 06/18/2017) he was doing well and ready for discharge. Follow-up appointment with Dr. Lucia Gaskins will be made.   Discharge Exam: Blood pressure 138/70, pulse 73, temperature 98.6 F (37 C), temperature source Oral, resp. rate 16, height 6' 0.01" (1.829 m), weight 76.2 kg (168 lb), SpO2 96 %.   Disposition: 01-Home or Self Care  Discharge Instructions    AMB Referral to [place] Management   Complete by:  As directed    Please reassign to [place]. Active prior to hospitalization. Wife is primary contact and is on active consent on file. Thanks. Marthenia Rolling, [place], [facility] [phone]   Reason for consult:  Please re-assign to Telephonic [facility]   Expected date of contact:  1-3 days (reserved for hospital discharges)     Allergies as of 06/18/2017      Reactions   Penicillins Rash   Has patient had a PCN reaction causing immediate rash, facial/tongue/throat swelling, SOB or lightheadedness with hypotension: YES Has patient had a PCN reaction causing severe rash involving mucus membranes or skin necrosis: No Has patient had a PCN reaction that required hospitalization No Has patient had a  PCN reaction occurring within the last 10 years: Yes  If all of the above answers are "NO", then may proceed with Cephalosporin use.      Medication List    TAKE these medications   acetaminophen 500 MG tablet Commonly known as:  TYLENOL Take 1 tablet (500 mg total) by mouth every 6 (six) hours as needed for mild pain, moderate pain, fever or headache.   albuterol 108 (90 Base) MCG/ACT inhaler Commonly known as:  PROVENTIL HFA;VENTOLIN HFA Inhale 2 puffs into the lungs every 6 (six) hours as needed for wheezing or shortness of breath.   atorvastatin 20 MG tablet Commonly known as:  LIPITOR Take 20 mg by mouth daily with breakfast.   cetirizine 10 MG tablet Commonly known as:  ZYRTEC Take 10 mg by mouth daily with breakfast.   ciprofloxacin 500 MG tablet Commonly known as:  CIPRO Take 1 tablet (500 mg total) by mouth 2 (two) times daily.   clopidogrel 75 MG tablet Commonly known as:  PLAVIX Take 75 mg daily by mouth.   escitalopram 10 MG tablet Commonly known as:  LEXAPRO TAKE ONE TABLET BY MOUTH ONCE DAILY IN THE MORNING   furosemide 20 MG tablet Commonly known as:  LASIX Take 1 tablet (20 mg total) by mouth daily.   ketoconazole 2 % cream Commonly known as:  NIZORAL APPLY AS NEEDED TO RASH What changed:  See the new instructions.   Melatonin 3 MG Tabs Take 3 mg by mouth at bedtime as needed (for sleep).   metoprolol tartrate 25 MG tablet Commonly known as:  LOPRESSOR Take 0.5 tablets (12.5  mg total) by mouth 2 (two) times daily. Hold if sbp less than 90 or heart rate less than 60.   multivitamin with minerals Tabs tablet Take 1 tablet by mouth daily with breakfast.   pantoprazole 40 MG tablet Commonly known as:  PROTONIX Take 1 tablet (40 mg total) by mouth 2 (two) times daily.   saccharomyces boulardii 250 MG capsule Commonly known as:  FLORASTOR Take 1 capsule (250 mg total) by mouth 2 (two) times daily.   sodium bicarbonate 650 MG tablet Take 1  tablet (650 mg total) by mouth daily.   testosterone enanthate 200 MG/ML injection Commonly known as:  DELATESTRYL Inject 200 mg into the muscle every 14 (fourteen) days. For IM use only   traMADol 50 MG tablet Commonly known as:  ULTRAM Take 50 mg by mouth every 6 (six) hours as needed for moderate pain.   traZODone 100 MG tablet Commonly known as:  DESYREL TAKE ONE TABLET BY MOUTH AT BEDTIME   triamcinolone cream 0.1 % Commonly known as:  KENALOG Apply 1 application topically 2 (two) times daily as needed (for rash). Applies to face        Signed: Menachem Urbanek J 06/22/2017, 10:52 AM

## 2017-06-22 NOTE — Telephone Encounter (Signed)
Attempted to call wife, but voicemail is full.  Will try at a different time.  Orders placed.

## 2017-06-22 NOTE — Telephone Encounter (Signed)
Patient's wife called to report that pt seems chilled, but not feverish, and his urine is very cloudy. She would like to bring in urine sample for testing.   Apolonio Schneiders - Please advise. Thanks!

## 2017-06-22 NOTE — Telephone Encounter (Signed)
Wife dropped off urine sample.

## 2017-06-22 NOTE — Telephone Encounter (Signed)
OK to send urine culture.

## 2017-06-23 LAB — URINE CULTURE
MICRO NUMBER: 81271757
SPECIMEN QUALITY:: ADEQUATE

## 2017-06-25 ENCOUNTER — Other Ambulatory Visit: Payer: Self-pay

## 2017-06-25 NOTE — Patient Outreach (Signed)
[facility]) Care Management  06/25/2017  Austin [name] Jul 23, 1956 [id number]   Referral Date: 06/19/17 Referral Source:  Hospital Liaison Date of Admission: 06/15/17 Diagnosis: Colostomy Stricture Date of Discharge: 06/18/17 Facility: [place]: HTA  Telephone call to patient/caregiver for weekly call.  Wife reports that patient is doing pretty good.  She reports he saw Dr. Lucia Gaskins on Wednesday and thing look ok and that he may stretch it again himself a little larger.  However, She states that the doctor thinks patient will have to have surgery eventually.  She states that stoma site looks ok but not like a normal stoma.  She does do the stretching daily as ordered to prevent stricture. No blood in the stool noted. Patient taking medications as prescribed and no pain.  Discussed with wife emergency plan if problems arise.  She verbalized understanding.   Plan:  [facility] CM Care Plan Problem One     Most Recent Value  Care Plan Problem One  at risk for readmission  Role Documenting the Problem One  Care Management Telephonic [place] for Problem One  Active  [facility] Long Term Goal   Patient will not be readmitted within the next 31 days.  THN Long Term Goal Start Date  06/19/17  Interventions for Problem One Long Term Goal  Discussed signs of obstruction and notifying provider. Encouraged to call RN CM as needed  THN CM Short Term Goal #1   Patient/caregiver will keep follow up appointmet within the next 1 week.  THN CM Short Term Goal #1 Start Date  06/19/17  [facility] CM Short Term Goal #1 Met Date  06/25/17  Interventions for Short Term Goal #1  Patient saw Dr. Lucia Gaskins on 06-24-17.  THN CM Short Term Goal #2   Patient/ caregiver will know signs of GI bleeding within 30 days.  THN CM Short Term Goal #2 Start Date  06/19/17  Interventions for Short Term Goal #2  Discussed signs of GI obstruction and notifying physician.     RN CM will contact  patient/caregiver next week and patient/caregiver agrees to next outreach.  [name], RN, MSN [facility] Care Management Care Management Coordinator Direct Line [phone] Toll Free: [phone]  Fax: [phone]

## 2017-06-29 ENCOUNTER — Encounter: Payer: Self-pay | Admitting: *Deleted

## 2017-06-29 ENCOUNTER — Telehealth: Payer: Self-pay | Admitting: *Deleted

## 2017-06-29 DIAGNOSIS — K922 Gastrointestinal hemorrhage, unspecified: Secondary | ICD-10-CM

## 2017-06-29 MED ORDER — PANTOPRAZOLE SODIUM 40 MG PO TBEC: 40.0000 mg | DELAYED_RELEASE_TABLET | Freq: Every day | ORAL | 5 refills | Status: DC

## 2017-06-29 NOTE — Telephone Encounter (Signed)
Dr Hilarie Fredrickson has spoken to patient's wife in office and would like patient to have refills on Protonix 40 mg once daily as well as IBC and CBC. Orders entered in [place].

## 2017-06-30 ENCOUNTER — Other Ambulatory Visit: Payer: Self-pay

## 2017-06-30 NOTE — Patient Outreach (Signed)
[facility]) Care Management  [place]  06/30/2017   Austin [name] [birth year] [id number]  Subjective: Telephone call for weekly Transition of care.  Spoke with wife she is able to verify HIPAA.  She reports that patient is doing ok still some problems with being cold and weak. She reports patient to have some blood work for iron studies done on tomorrow. She reports that the GI doctor mentioned a possible iron infusion if blood work is abnormal.  Patient has several appointments in the next two weeks to PCP, Surgeon, GI, and oncologist.  Wife continues to stretch the stoma daily but reports some thickening and knows it is a matter of time before patient has to have a revision of the stoma but surgeon wants to make sure patient is healed from previous surgery and stable. Wife know signs of GI bleed and obstruction and who to notify.   Objective:   Encounter Medications:  Outpatient Encounter Medications as of 06/30/2017  Medication Sig Note  . acetaminophen (TYLENOL) 500 MG tablet Take 1 tablet (500 mg total) by mouth every 6 (six) hours as needed for mild pain, moderate pain, fever or headache.   . albuterol (PROVENTIL HFA;VENTOLIN HFA) 108 (90 BASE) MCG/ACT inhaler Inhale 2 puffs into the lungs every 6 (six) hours as needed for wheezing or shortness of breath.   Marland Kitchen atorvastatin (LIPITOR) 20 MG tablet Take 20 mg by mouth daily with breakfast.    . cetirizine (ZYRTEC) 10 MG tablet Take 10 mg by mouth daily with breakfast.   . clopidogrel (PLAVIX) 75 MG tablet Take 75 mg daily by mouth.   . escitalopram (LEXAPRO) 10 MG tablet TAKE ONE TABLET BY MOUTH ONCE DAILY IN THE MORNING   . furosemide (LASIX) 20 MG tablet Take 1 tablet (20 mg total) by mouth daily.   Marland Kitchen ketoconazole (NIZORAL) 2 % cream APPLY AS NEEDED TO RASH (Patient taking differently: Apply to groin or legs daily as needed for rash)   . Melatonin 3 MG TABS Take 3 mg by mouth at bedtime as needed (for sleep).    . Multiple Vitamin (MULTIVITAMIN WITH MINERALS) TABS tablet Take 1 tablet by mouth daily with breakfast.    . pantoprazole (PROTONIX) 40 MG tablet Take 1 tablet (40 mg total) daily by mouth.   . saccharomyces boulardii (FLORASTOR) 250 MG capsule Take 1 capsule (250 mg total) by mouth 2 (two) times daily.   . sodium bicarbonate 650 MG tablet Take 1 tablet (650 mg total) by mouth daily.   Marland Kitchen testosterone enanthate (DELATESTRYL) 200 MG/ML injection Inject 200 mg into the muscle every 14 (fourteen) days. For IM use only   . traMADol (ULTRAM) 50 MG tablet Take 50 mg by mouth every 6 (six) hours as needed for moderate pain.   . traZODone (DESYREL) 100 MG tablet TAKE ONE TABLET BY MOUTH AT BEDTIME   . triamcinolone cream (KENALOG) 0.1 % Apply 1 application topically 2 (two) times daily as needed (for rash). Applies to face   . ciprofloxacin (CIPRO) 500 MG tablet Take 1 tablet (500 mg total) by mouth 2 (two) times daily. (Patient not taking: Reported on 06/25/2017) 06/15/2017: COMPLETED Friday 06/12/17  . metoprolol tartrate (LOPRESSOR) 25 MG tablet Take 0.5 tablets (12.5 mg total) by mouth 2 (two) times daily. Hold if sbp less than 90 or heart rate less than 60. (Patient not taking: Reported on 06/25/2017) 06/10/2017: Has not had to take.   No facility-administered encounter medications on file as  of 06/30/2017.     Functional Status:  In your present state of health, do you have any difficulty performing the following activities: 06/19/2017 06/15/2017  Hearing? N N  Vision? N N  Difficulty concentrating or making decisions? N N  Walking or climbing stairs? Y Y  Dressing or bathing? Y Y  Doing errands, shopping? [place]pie Donning  Preparing Food and eating ? Y -  Using the Toilet? Y -  Do you have problems with loss of bowel control? N -  Managing your Medications? Y -  Managing your Finances? Y -  Housekeeping or managing your Housekeeping? Y -  Some recent data might be hidden    Fall/Depression  Screening: Fall Risk  06/19/2017 06/03/2017 02/19/2017  Falls in the past year? No No No  Risk for fall due to : - - -  Risk for fall due to: Comment - - -   PHQ 2/9 Scores 06/19/2017 06/03/2017 02/19/2017 05/25/2014  PHQ - 2 Score 0 0 0 0    Assessment:  Patient/caregiver continues to benefit from care manager outreach for disease management and support.    Plan:  [facility] CM Care Plan Problem One     Most Recent Value  Care Plan Problem One  at risk for readmission  Role Documenting the Problem One  Care Management Telephonic [place] for Problem One  Active  [facility] Long Term Goal   Patient will not be readmitted within the next 31 days.  THN Long Term Goal Start Date  06/19/17  Interventions for Problem One Long Term Goal  Reiterated with wife obstruction and daily stretching of ostomy site. Encouraged to call for questions.    THN CM Short Term Goal #1   Patient/caregiver will keep follow up appointmet within the next 1 week.  THN CM Short Term Goal #1 Start Date  06/19/17  [facility] CM Short Term Goal #1 Met Date  06/25/17  Interventions for Short Term Goal #1  Patient saw Dr. Lucia Gaskins on 06-24-17.  THN CM Short Term Goal #2   Patient/ caregiver will know signs of GI bleeding within 30 days.  THN CM Short Term Goal #2 Start Date  06/19/17  [facility] CM Short Term Goal #2 Met Date  06/30/17  Interventions for Short Term Goal #2  Wife aware of signs of GI Bleed.      RN CM will contact patient/caregiver next week and patient/caregiver agrees to next outreach.   [name], RN, MSN [facility] Care Management Care Management Coordinator Direct Line [phone] Toll Free: [phone]  Fax: [phone]

## 2017-07-01 ENCOUNTER — Other Ambulatory Visit (INDEPENDENT_AMBULATORY_CARE_PROVIDER_SITE_OTHER): Payer: PPO

## 2017-07-01 ENCOUNTER — Other Ambulatory Visit: Payer: Self-pay

## 2017-07-01 DIAGNOSIS — K922 Gastrointestinal hemorrhage, unspecified: Secondary | ICD-10-CM

## 2017-07-01 DIAGNOSIS — D509 Iron deficiency anemia, unspecified: Secondary | ICD-10-CM | POA: Diagnosis not present

## 2017-07-01 LAB — CBC WITH DIFFERENTIAL/PLATELET
BASOS ABS: 0.1 10*3/uL (ref 0.0–0.1)
BASOS PCT: 1 % (ref 0.0–3.0)
EOS PCT: 1.8 % (ref 0.0–5.0)
Eosinophils Absolute: 0.2 10*3/uL (ref 0.0–0.7)
HEMATOCRIT: 25.2 % — AB (ref 39.0–52.0)
Hemoglobin: 8 g/dL — CL (ref 13.0–17.0)
LYMPHS ABS: 1.2 10*3/uL (ref 0.7–4.0)
LYMPHS PCT: 8.8 % — AB (ref 12.0–46.0)
MCHC: 31.5 g/dL (ref 30.0–36.0)
MCV: 79.3 fl (ref 78.0–100.0)
MONOS PCT: 6.9 % (ref 3.0–12.0)
Monocytes Absolute: 0.9 10*3/uL (ref 0.1–1.0)
NEUTROS ABS: 11.1 10*3/uL — AB (ref 1.4–7.7)
Neutrophils Relative %: 81.5 % — ABNORMAL HIGH (ref 43.0–77.0)
Platelets: 425 10*3/uL — ABNORMAL HIGH (ref 150.0–400.0)
RBC: 3.18 Mil/uL — ABNORMAL LOW (ref 4.22–5.81)
RDW: 20.1 % — ABNORMAL HIGH (ref 11.5–15.5)
WBC: 13.6 10*3/uL — ABNORMAL HIGH (ref 4.0–10.5)

## 2017-07-01 LAB — IBC PANEL
IRON: 13 ug/dL — AB (ref 42–165)
Saturation Ratios: 5.6 % — ABNORMAL LOW (ref 20.0–50.0)
TRANSFERRIN: 166 mg/dL — AB (ref 212.0–360.0)

## 2017-07-01 LAB — FERRITIN: FERRITIN: 32.6 ng/mL (ref 22.0–322.0)

## 2017-07-06 ENCOUNTER — Encounter: Payer: Self-pay | Admitting: Family Medicine

## 2017-07-07 ENCOUNTER — Other Ambulatory Visit: Payer: Self-pay | Admitting: Family Medicine

## 2017-07-08 ENCOUNTER — Other Ambulatory Visit: Payer: Self-pay

## 2017-07-08 ENCOUNTER — Other Ambulatory Visit: Payer: Self-pay | Admitting: Family Medicine

## 2017-07-08 ENCOUNTER — Ambulatory Visit: Payer: PPO | Admitting: Family Medicine

## 2017-07-08 DIAGNOSIS — D509 Iron deficiency anemia, unspecified: Secondary | ICD-10-CM

## 2017-07-08 NOTE — Telephone Encounter (Signed)
Refill for 6 months. 

## 2017-07-08 NOTE — Patient Outreach (Signed)
[facility]) Care Management  [place]  07/08/2017   Austin [name] July 03, 1956 [id number]  Subjective: Telephone call to patient and caregiver for weekly call. Wife verifies HIPAA.  She reports that patient is some frustrated due to longer recovery. She reports that he feels weak and tired, and cold.  She states that his hemoglobin is coming up and last check was 8.4.  She reports that the iron was a little low and that they were doing another part of the test but has not heard anything. However, she states that she suspects that the test was ok as no further intervention has been ordered. Patient has numerous appointments next week for follow up.  She continues to dilate his stoma site daily and stomach has some soreness from it.  Otherwise no other issues with stoma.  Encouraged to continue regimen and call for any concerns.  She verbalized understanding.   Objective:   Encounter Medications:  Outpatient Encounter Medications as of 07/08/2017  Medication Sig Note  . acetaminophen (TYLENOL) 500 MG tablet Take 1 tablet (500 mg total) by mouth every 6 (six) hours as needed for mild pain, moderate pain, fever or headache.   . albuterol (PROVENTIL HFA;VENTOLIN HFA) 108 (90 BASE) MCG/ACT inhaler Inhale 2 puffs into the lungs every 6 (six) hours as needed for wheezing or shortness of breath.   Marland Kitchen atorvastatin (LIPITOR) 20 MG tablet Take 20 mg by mouth daily with breakfast.    . cetirizine (ZYRTEC) 10 MG tablet Take 10 mg by mouth daily with breakfast.   . clopidogrel (PLAVIX) 75 MG tablet Take 75 mg daily by mouth.   . escitalopram (LEXAPRO) 10 MG tablet TAKE ONE TABLET BY MOUTH ONCE DAILY IN THE MORNING   . furosemide (LASIX) 20 MG tablet Take 1 tablet (20 mg total) by mouth daily.   Marland Kitchen ketoconazole (NIZORAL) 2 % cream APPLY AS NEEDED TO RASH (Patient taking differently: Apply to groin or legs daily as needed for rash)   . Melatonin 3 MG TABS Take 3 mg by mouth at bedtime  as needed (for sleep).   . Multiple Vitamin (MULTIVITAMIN WITH MINERALS) TABS tablet Take 1 tablet by mouth daily with breakfast.    . pantoprazole (PROTONIX) 40 MG tablet Take 1 tablet (40 mg total) daily by mouth.   . saccharomyces boulardii (FLORASTOR) 250 MG capsule Take 1 capsule (250 mg total) by mouth 2 (two) times daily.   . sodium bicarbonate 650 MG tablet Take 1 tablet (650 mg total) by mouth daily.   Marland Kitchen testosterone enanthate (DELATESTRYL) 200 MG/ML injection INJECT 1ML INTO THE MUSCLE EVERY 2 WEEKS   . traMADol (ULTRAM) 50 MG tablet Take 50 mg by mouth every 6 (six) hours as needed for moderate pain.   . traZODone (DESYREL) 100 MG tablet TAKE ONE TABLET BY MOUTH AT BEDTIME   . triamcinolone cream (KENALOG) 0.1 % Apply 1 application topically 2 (two) times daily as needed (for rash). Applies to face   . ciprofloxacin (CIPRO) 500 MG tablet Take 1 tablet (500 mg total) by mouth 2 (two) times daily. (Patient not taking: Reported on 06/25/2017) 06/15/2017: COMPLETED Friday 06/12/17  . metoprolol tartrate (LOPRESSOR) 25 MG tablet Take 0.5 tablets (12.5 mg total) by mouth 2 (two) times daily. Hold if sbp less than 90 or heart rate less than 60. (Patient not taking: Reported on 06/25/2017) 06/10/2017: Has not had to take.   No facility-administered encounter medications on file as of 07/08/2017.  Functional Status:  In your present state of health, do you have any difficulty performing the following activities: 06/19/2017 06/15/2017  Hearing? N N  Vision? N N  Difficulty concentrating or making decisions? N N  Walking or climbing stairs? Y Y  Dressing or bathing? Y Y  Doing errands, shopping? Tempie Donning  Preparing Food and eating ? Y -  Using the Toilet? Y -  Do you have problems with loss of bowel control? N -  Managing your Medications? Y -  Managing your Finances? Y -  Housekeeping or managing your Housekeeping? Y -  Some recent data might be hidden    Fall/Depression Screening: Fall  Risk  06/19/2017 06/03/2017 02/19/2017  Falls in the past year? No No No  Risk for fall due to : - - -  Risk for fall due to: Comment - - -   PHQ 2/9 Scores 06/19/2017 06/03/2017 02/19/2017 05/25/2014  PHQ - 2 Score 0 0 0 0    Assessment: Patient continues to benefit from care manager outreach for disease management and support.    Plan:  [facility] CM Care Plan Problem One     Most Recent Value  Care Plan Problem One  at risk for readmission  Role Documenting the Problem One  Care Management Telephonic [place] for Problem One  Active  [facility] Long Term Goal   Patient will not be readmitted within the next 31 days.  [facility] Long Term Goal Start Date  06/19/17  Interventions for Problem One Long Term Goal  Reinforced with wife obstruction and daily stretching of ostomy site. Encouraged to call for questions.    [facility] CM Short Term Goal #1   Patient/caregiver will keep follow up appointmet within the next 1 week.  [facility] CM Short Term Goal #1 Start Date  06/19/17  [facility] CM Short Term Goal #1 Met Date  06/25/17  Interventions for Short Term Goal #1  Patient saw Dr. Lucia Gaskins on 06-24-17.  [facility] CM Short Term Goal #2   Patient/ caregiver will know signs of GI bleeding within 30 days.  [facility] CM Short Term Goal #2 Start Date  06/19/17  [facility] CM Short Term Goal #2 Met Date  06/30/17  Interventions for Short Term Goal #2  Wife aware of signs of GI Bleed.      RN CM will contact patient next week and patient/caregiver agrees to next outreach.  [name], RN, MSN [facility] Care Management Care Management Coordinator Direct Line [phone] Toll Free: [phone]  Fax: [phone]

## 2017-07-13 ENCOUNTER — Encounter: Payer: Self-pay | Admitting: Family Medicine

## 2017-07-13 ENCOUNTER — Ambulatory Visit: Payer: PPO | Admitting: Family Medicine

## 2017-07-13 ENCOUNTER — Encounter: Payer: Self-pay | Admitting: Physician Assistant

## 2017-07-13 ENCOUNTER — Ambulatory Visit: Payer: PPO | Admitting: Physician Assistant

## 2017-07-13 VITALS — BP 140/80 | HR 72

## 2017-07-13 VITALS — BP 120/70 | HR 83 | Temp 98.1°F

## 2017-07-13 DIAGNOSIS — R600 Localized edema: Secondary | ICD-10-CM | POA: Diagnosis not present

## 2017-07-13 DIAGNOSIS — I10 Essential (primary) hypertension: Secondary | ICD-10-CM | POA: Diagnosis not present

## 2017-07-13 DIAGNOSIS — D500 Iron deficiency anemia secondary to blood loss (chronic): Secondary | ICD-10-CM | POA: Diagnosis not present

## 2017-07-13 DIAGNOSIS — D62 Acute posthemorrhagic anemia: Secondary | ICD-10-CM | POA: Diagnosis not present

## 2017-07-13 DIAGNOSIS — Z86010 Personal history of colonic polyps: Secondary | ICD-10-CM

## 2017-07-13 DIAGNOSIS — C649 Malignant neoplasm of unspecified kidney, except renal pelvis: Secondary | ICD-10-CM

## 2017-07-13 DIAGNOSIS — K264 Chronic or unspecified duodenal ulcer with hemorrhage: Secondary | ICD-10-CM | POA: Diagnosis not present

## 2017-07-13 DIAGNOSIS — Z933 Colostomy status: Secondary | ICD-10-CM | POA: Diagnosis not present

## 2017-07-13 DIAGNOSIS — K9409 Other complications of colostomy: Secondary | ICD-10-CM | POA: Diagnosis not present

## 2017-07-13 DIAGNOSIS — K9403 Colostomy malfunction: Secondary | ICD-10-CM

## 2017-07-13 DIAGNOSIS — C7989 Secondary malignant neoplasm of other specified sites: Secondary | ICD-10-CM | POA: Diagnosis not present

## 2017-07-13 DIAGNOSIS — Z7901 Long term (current) use of anticoagulants: Secondary | ICD-10-CM

## 2017-07-13 MED ORDER — PANTOPRAZOLE SODIUM 40 MG PO TBEC: 40.0000 mg | DELAYED_RELEASE_TABLET | Freq: Two times a day (BID) | ORAL | 0 refills | Status: DC

## 2017-07-13 NOTE — Progress Notes (Addendum)
Subjective:    Patient ID: Austin [name], male    DOB: July 16, 1956, 61 y.o.   MRN: [id number]  HPI Austin [name] is a very nice 61 year old white male, known to Dr. Hilarie Fredrickson with a complicated past medical history.  He comes in today for post hospital follow-up after a recent GI bleed. 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  He also had resection of metastatic carcinoma and enteral lysis at the time of surgery. He was admitted in October with an acute GI bleed and underwent EGD and colonoscopy with Dr. Ardis Hughs on 05/31/2017.  Colonoscopy via the ostomy showed a stenotic ostomy which was inflamed and friable and diverticular changes.  EGD showed moderate duodenitis, duodenal bulb ulcer and gastritis.  Patient has been on twice daily PPI, biopsies were negative for H. pylori.  He had been advised at the time of discharge that he could resume Plavix but has not and does not want to go back on Plavix. He also has had a second admission in November 2018, per surgery for colostomy stricture, and underwent dilations of the stricture.  His wife is now doing daily dilations and he is scheduled to see Dr. Lucia Gaskins later this week for a more extensive dilation. Patient is feeling well today, he and his wife do not feel that he has had any further bleeding and his ostomy is functioning well.  He is taking MiraLAX to keep his stools very loose.  He has no complaints of abdominal pain.  Again he has remained off Plavix. Most recent labs 07/01/2017 showed hemoglobin of 8 hematocrit of 25.2 ferritin of 32.6. He is scheduled to have 2 ferriheme infusions ordered per Dr. Hilarie Fredrickson, and follow-up labs in a month and follow-up iron studies in 3  months.  Review of Systems Pertinent positive and negative review of systems were noted in the above HPI section.  All other review of systems was otherwise negative.  Outpatient Encounter Medications as of 07/13/2017  Medication Sig  . acetaminophen (TYLENOL) 500 MG tablet Take 1 tablet (500 mg total) by mouth every 6 (six) hours as needed for mild pain, moderate pain, fever or headache.  . albuterol (PROVENTIL HFA;VENTOLIN HFA) 108 (90 BASE) MCG/ACT inhaler Inhale 2 puffs into the lungs every 6 (six) hours as needed for wheezing or shortness of breath.  Marland Kitchen atorvastatin (LIPITOR) 20 MG tablet Take 20 mg by mouth daily with breakfast.   . cetirizine (ZYRTEC) 10 MG tablet Take 10 mg by mouth daily with breakfast.  . escitalopram (LEXAPRO) 10 MG tablet TAKE ONE TABLET BY MOUTH ONCE DAILY IN THE MORNING  . furosemide (LASIX) 20 MG tablet Take 1 tablet (20 mg total) by mouth daily.  Marland Kitchen ketoconazole (NIZORAL) 2 % cream APPLY AS NEEDED TO RASH (Patient taking differently: Apply to groin or legs daily as needed for rash)  . Melatonin 3 MG TABS Take 3 mg by mouth at bedtime as needed (for sleep).  . Multiple Vitamin (MULTIVITAMIN WITH MINERALS) TABS tablet Take 1 tablet by mouth daily with breakfast.   . pantoprazole (PROTONIX) 40 MG tablet Take 1 tablet (40 mg total) by mouth 2 (two) times daily.  Marland Kitchen saccharomyces boulardii (FLORASTOR) 250 MG capsule Take 1 capsule (250 mg total) by mouth 2 (two) times  daily.  . testosterone enanthate (DELATESTRYL) 200 MG/ML injection INJECT 1ML INTO THE MUSCLE EVERY 2 WEEKS  . traMADol (ULTRAM) 50 MG tablet Take 50 mg by mouth every 6 (six) hours as needed for moderate pain.  . traZODone (DESYREL) 100 MG tablet TAKE ONE TABLET BY MOUTH AT BEDTIME  . triamcinolone cream (KENALOG) 0.1 % Apply 1 application topically 2 (two) times daily as needed (for rash). Applies to face  . [DISCONTINUED] pantoprazole (PROTONIX) 40 MG tablet Take 1 tablet (40 mg total) daily by mouth.  .  clopidogrel (PLAVIX) 75 MG tablet Take 75 mg daily by mouth.  . metoprolol tartrate (LOPRESSOR) 25 MG tablet Take 0.5 tablets (12.5 mg total) by mouth 2 (two) times daily. Hold if sbp less than 90 or heart rate less than 60. (Patient not taking: Reported on 06/25/2017)  . [DISCONTINUED] ciprofloxacin (CIPRO) 500 MG tablet Take 1 tablet (500 mg total) by mouth 2 (two) times daily. (Patient not taking: Reported on 06/25/2017)  . [DISCONTINUED] sodium bicarbonate 650 MG tablet Take 1 tablet (650 mg total) by mouth daily.   No facility-administered encounter medications on file as of 07/13/2017.    Allergies  Allergen Reactions  . Penicillins Rash    Has patient had a PCN reaction causing immediate rash, facial/tongue/throat swelling, SOB or lightheadedness with hypotension: YES Has patient had a PCN reaction causing severe rash involving mucus membranes or skin necrosis: No Has patient had a PCN reaction that required hospitalization No Has patient had a PCN reaction occurring within the last 10 years: Yes  If all of the above answers are "NO", then may proceed with Cephalosporin use.   Patient Active Problem List   Diagnosis Date Noted  . 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 AKI (acute kidney injury) ([place])   . Colostomy in place  05/30/2017  . Blood loss anemia 05/29/2017  . 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 Perforation of sigmoid colon due to diverticulitis s/p colostomy 9/18 09/27/2016  . Fatigue 05/23/2015  . Hyperlipidemia 10/17/2013  . Renal carcinoma metastatic with carcinomatosis 11/09/2012  . Type 2 diabetes mellitus ([place]) 03/31/2012  . Anemia associated with acute blood loss 02/28/2012  . Abdominal pain, other specified site 02/27/2012  . Low testosterone 06/21/2010  . WEAKNESS 04/03/2010  . FACIAL WEAKNESS 04/03/2010  . EDEMA 01/15/2009  . Pituitary tumor 01/15/2009  . Essential hypertension 10/16/2008  .  Neurogenic bladder 10/16/2008  . OTHER SEBORRHEIC DERMATITIS 10/16/2008  . COLONIC POLYPS, HX OF 10/16/2008   Social History   Socioeconomic History  . Marital status: Married    Spouse name: Not on file  . Number of children: 3  . Years of education: Not on file  . Highest education level: Not on file  Social Needs  . Financial resource strain: Not on file  . Food insecurity - worry: Not on file  . Food insecurity - inability: Not on file  . Transportation needs - medical: Not on file  . Transportation needs - non-medical: Not on file  Occupational History  . Occupation: disabled    Fish farm manager: UNEMPLOYED  Tobacco Use  . Smoking status: Former Smoker    Packs/day: 0.50    Years: 23.00    Pack years: 11.50    Types: Cigarettes    Last attempt to quit: 09/24/1992    Years since quitting: 24.8  . Smokeless tobacco: Never Used  Substance and Sexual Activity  . Alcohol use: No  Alcohol/week: 0.0 oz    Comment: rare  . Drug use: No  . Sexual activity: Not on file  Other Topics Concern  . Not on file  Social History Narrative   Retired, disabled 34 Quadreplegic    Mr. Boesel family history includes Alcohol abuse in his father; Arthritis in his maternal grandmother and mother; Colon cancer in his brother; Esophageal cancer (age of onset: 16) in his father; Hyperlipidemia in his mother; Hypertension in his brother; Stroke in his paternal grandmother; Throat cancer in his father; Uterine cancer (age of onset: 62) in his mother.      Objective:    Vitals:   07/13/17 1332  BP: 140/80  Pulse: 72    Physical Exam well-developed older white male, in a wheelchair, he speaks very slowly and quietly.  He is accompanied by his wife who gives most of the history blood pressure 140/80 pulse 72, not weighed today patient nonambulatory.  HEENT ;nontraumatic normocephalic EOMI PERRLA sclera anicteric, Cardiovascular ;regular rate and rhythm with S1-S2, Pulmonary ;clear bilaterally,  Abdomen; soft, nontender nondistended bowel sounds are present he has a colostomy in the left mid quadrant with light brown stool, Rectal; exam not done, Extremities;quadriplegia, Neuro psych; mood and affect appropriate       Assessment & Plan:   #57 61 year old white male seen today in post hospital follow-up after recent admission for upper GI bleed secondary to duodenal bulb ulcer, bleeding was in setting of chronic Plavix use. 2.  Acute anemia secondary to acute GI blood loss, iron deficiency anemia #3 status post recent sigmoid resection with diverting end colostomy September 9390 after complicated sigmoid diverticulitis with abscess and persistent fistula Colostomy has developed a friable stricture, he had a second admission in November for dilation of the colostomy and is scheduled for further dilations later this week per surgery  #4 status post remote CVA with quadriplegia-patient had been maintained on chronic Plavix-off since October #5 history of renal cell CA, metastatic with carcinomatosis #6 history of adenomatous colon polyps colonoscopy 3009 complicated by post polypectomy bleed  Plan; patient has only been taking once daily Protonix over the past several weeks, they will increase to twice daily for at least 1 more month and then decrease to once daily long-term every morning  Patient and wife were advised that from a GI standpoint he was stable to resume Plavix.  They are both very reluctant to resume Plavix, and want to further discuss with surgery and Dr. [name].  Alternatively he could also start a daily aspirin. He will complete ferriheme infusions, then repeat hemoglobin in 4 weeks and plan to repeat iron studies in 3 months. He will follow-up with Dr. Hilarie Fredrickson in 3 months or sooner as needed.    Austin [name] S Leni Pankonin PA-C 07/13/2017   Cc: [name], MD  Addendum: Reviewed and agree with management. Pyrtle, Lajuan Lines, MD

## 2017-07-13 NOTE — Patient Instructions (Signed)
We sent a prescription for twice daily Protonix 40 mg for one month.  You will then go to once daily. Call us for refills.   We made you a follow up with Dr. Hilarie Fredrickson for 09-11-2017 at 1:30 PM.

## 2017-07-13 NOTE — Progress Notes (Signed)
Subjective:     Patient ID: Austin [name], male   DOB: [birth year], 61 y.o.   MRN: [id number]  HPI Patient seen for hospital follow-up. He was admitted on 06/15/17 with colostomy stricture and underwent a couple of dilations-with improvement in abdominal bloating and pain. He has scheduled follow-up with surgery Thursday this week. His colostomy did begin working again with dilations and hydration. He is doing fairly well at this time. His wife is dilating daily and the most she has been able to dilate is 12 mm but usually 11 mm. No fevers or chills. Generalized weakness and some of this may be related to anemia.  Recent hemoglobin 8.0 with low iron. Patient is scheduled for iron transfusion and this will be 11th of December.  Wife relates he has had some recent mild peripheral edema since hospital discharge. No orthopnea. He has Lasix at home 20 mg but has not taken any recently. Recent creatinine stable.  Past Medical History:  Diagnosis Date  . Brainstem stroke ([place]) 10/16/2008   Qualifier: Diagnosis of  By: [name] LPN, [name]    . C. difficile colitis 02/08/2017  . Cerebrovascular accident ([place]) 1994   hx of brainstem stroke, residual diminished lung capacity  . Chronic kidney disease    Left Renal Mass  . Diabetes mellitus without complication ([place])    no medications;   Marland Kitchen Duodenal ulcer   . GIB (gastrointestinal bleeding) 05/28/2017  . Hx of colonic polyps   . Hypertension   . Hypogonadism   . Intra-abdominal abscess ([place]) 01/23/2017  . 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 Neuromuscular disorder ([place])    quadraplegic  . Open abdominal incision with drainage 01/26/2017  . Pituitary adenoma ([place])   . 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 Pneumonia   . 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[place])   . Urinary incontinence   . Venous stasis    edema   Past Surgical History:  Procedure Laterality Date  . CHOLECYSTECTOMY    .  COLON RESECTION N/A 05/04/2017   Procedure: LAPAROSCOPIC SIGMOID COLON RESECTION WITH END COLSOTOMY ERAS PATHWAY;  Surgeon: Alphonsa Overall, MD;  Location: [facility]S;  Service: General;  Laterality: N/A;  . COLONOSCOPY  02/27/2012   Procedure: COLONOSCOPY;  Surgeon: Jerene Bears, MD;  Location: [facility] ENDOSCOPY;  Service: Gastroenterology;  Laterality: N/A;  . COLONOSCOPY N/A 05/31/2017   Procedure: COLONOSCOPY;  Surgeon: Milus Banister, MD;  Location: [facility] ENDOSCOPY;  Service: Endoscopy;  Laterality: N/A;  . ESOPHAGOGASTRODUODENOSCOPY N/A 05/31/2017   Procedure: ESOPHAGOGASTRODUODENOSCOPY (EGD);  Surgeon: Milus Banister, MD;  Location: [facility];  Service: Endoscopy;  Laterality: N/A;  . gamma knife radiation surgery  05/2010   for pituitary  . IR RADIOLOGIST EVAL & MGMT  02/12/2017  . IR RADIOLOGIST EVAL & MGMT  03/17/2017  . IR RADIOLOGIST EVAL & MGMT  04/14/2017  . IR RADIOLOGIST EVAL & MGMT  02/05/2017  . PITUITARY SURGERY  2007   nose approach  . ROBOT ASSISTED LAPAROSCOPIC NEPHRECTOMY  04/23/2012   Procedure: ROBOTIC ASSISTED LAPAROSCOPIC NEPHRECTOMY;  Surgeon: Alexis Frock, MD;  Location: [facility];  Service: Urology;  Laterality: Left;  radical  . Robotic Partial Nephrectomy  04-23-12   left   Left Renal Mass  . stomach peg  02/1993   removed 5-6 years later  . TRACHEOSTOMY TUBE PLACEMENT  02/1993   removed 04/1993  . UMBILICAL HERNIA REPAIR  04/23/2012  Procedure: HERNIA REPAIR UMBILICAL ADULT;  Surgeon: Alexis Frock, MD;  Location: [facility];  Service: Urology;;  . [name]  . vocal cord surgery     injected with collagen, then fat from stomach to improve speech s/p stroke    reports that he quit smoking about 24 years ago. His smoking use included cigarettes. He has a 11.50 pack-year smoking history. he has never used smokeless tobacco. He reports that he does not drink alcohol or use drugs. family history includes Alcohol abuse in his father; Arthritis in his maternal grandmother  and mother; Colon cancer in his brother; Esophageal cancer (age of onset: 49) in his father; Hyperlipidemia in his mother; Hypertension in his brother; Stroke in his paternal grandmother; Throat cancer in his father; Uterine cancer (age of onset: 36) in his mother. Allergies  Allergen Reactions  . Penicillins Rash    Has patient had a PCN reaction causing immediate rash, facial/tongue/throat swelling, SOB or lightheadedness with hypotension: YES Has patient had a PCN reaction causing severe rash involving mucus membranes or skin necrosis: No Has patient had a PCN reaction that required hospitalization No Has patient had a PCN reaction occurring within the last 10 years: Yes  If all of the above answers are "NO", then may proceed with Cephalosporin use.     Review of Systems  Constitutional: Positive for fatigue.  Eyes: Negative for visual disturbance.  Respiratory: Negative for cough, chest tightness and shortness of breath.   Cardiovascular: Negative for chest pain, palpitations and leg swelling.  Gastrointestinal: Negative for abdominal pain, nausea and vomiting.  Genitourinary: Negative for dysuria.  Neurological: Negative for dizziness, syncope, weakness, light-headedness and headaches.       Objective:   Physical Exam  Constitutional: He is oriented to person, place, and time. He appears well-developed and well-nourished.  Cardiovascular: Normal rate and regular rhythm.  Pulmonary/Chest: Effort normal and breath sounds normal. No respiratory distress. He has no wheezes. He has no rales.  Abdominal: Soft. There is no tenderness.  Musculoskeletal: He exhibits edema.  1+ pitting edema ankles and lower legs bilaterally  Neurological: He is alert and oriented to person, place, and time.       Assessment:     #1 recent colostomy stricture-dilated as above  #2 iron deficiency anemia-pending IV iron infusion which has been ordered through GI  #3 peripheral edema-    Plan:      -Recommend start back furosemide 20 mg once daily for 2-3 days and follow for now -iron infusion as above -We'll plan routine follow-up in 3 months and sooner as needed  [name] MD [facility]

## 2017-07-13 NOTE — Patient Instructions (Signed)
Consider taking the Lasix 20 mg daily for 2-3 days to see if leg edema improves.

## 2017-07-14 ENCOUNTER — Ambulatory Visit: Payer: PPO | Admitting: Oncology

## 2017-07-14 ENCOUNTER — Other Ambulatory Visit (HOSPITAL_BASED_OUTPATIENT_CLINIC_OR_DEPARTMENT_OTHER): Payer: PPO

## 2017-07-14 ENCOUNTER — Telehealth: Payer: Self-pay | Admitting: Oncology

## 2017-07-14 VITALS — BP 160/62 | HR 85 | Temp 98.4°F | Resp 18 | Ht 72.0 in | Wt 165.0 lb

## 2017-07-14 DIAGNOSIS — D509 Iron deficiency anemia, unspecified: Secondary | ICD-10-CM

## 2017-07-14 DIAGNOSIS — Z85528 Personal history of other malignant neoplasm of kidney: Secondary | ICD-10-CM

## 2017-07-14 DIAGNOSIS — C649 Malignant neoplasm of unspecified kidney, except renal pelvis: Secondary | ICD-10-CM

## 2017-07-14 LAB — CBC WITH DIFFERENTIAL/PLATELET
BASO%: 0.7 % (ref 0.0–2.0)
BASOS ABS: 0.1 10*3/uL (ref 0.0–0.1)
EOS ABS: 0.4 10*3/uL (ref 0.0–0.5)
EOS%: 3 % (ref 0.0–7.0)
HCT: 29.7 % — ABNORMAL LOW (ref 38.4–49.9)
HEMOGLOBIN: 8.6 g/dL — AB (ref 13.0–17.1)
LYMPH%: 12.1 % — ABNORMAL LOW (ref 14.0–49.0)
MCH: 23.4 pg — AB (ref 27.2–33.4)
MCHC: 29 g/dL — ABNORMAL LOW (ref 32.0–36.0)
MCV: 80.7 fL (ref 79.3–98.0)
MONO#: 0.9 10*3/uL (ref 0.1–0.9)
MONO%: 7.6 % (ref 0.0–14.0)
NEUT%: 76.6 % — ABNORMAL HIGH (ref 39.0–75.0)
NEUTROS ABS: 9.1 10*3/uL — AB (ref 1.5–6.5)
PLATELETS: 373 10*3/uL (ref 140–400)
RBC: 3.68 10*6/uL — ABNORMAL LOW (ref 4.20–5.82)
RDW: 18.8 % — AB (ref 11.0–14.6)
WBC: 11.9 10*3/uL — ABNORMAL HIGH (ref 4.0–10.3)
lymph#: 1.4 10*3/uL (ref 0.9–3.3)

## 2017-07-14 NOTE — Progress Notes (Signed)
Hematology and Oncology Follow Up Visit  Austin [name] [id number] 07-26-1956 61 y.o. 07/14/2017 9:34 AM [name], [name], MDBurchette, [name], MD   Principle Diagnosis: 61 year old man with a renal cell carcinoma diagnosed in 2013 he presented with a 4.2 cm mass in the lower pole of the left kidney. Now he has stage IV disease. He has peritoneal involvement as well as splenic metastasis. 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 He is status post robotic-assisted laparoscopic nephrectomy on the left done on 04/23/2012. The pathology revealed renal cell carcinoma with a grade 3/4 with the pathological staging of T3a.   2. Votrient 800 mg daily started around 12/27/2013. This was reduced to 400 mg subsequently and have been discontinued since 02/03/2014 due to poor tolerance.  Current therapy: Observation and surveillance.  Interim History: Austin [name] presents today for a followup visit with his wife. Since his last visit, he underwent a sigmoid resection and a colostomy formation in September 2018.  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  He developed iron deficiency anemia and scheduled to have repeat IV iron infusion in the near future.  Clinically, he reports feeling better although he still quite fatigued.  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  He is being evaluated by Dr. Lucia Gaskins for possible revision.  He recovered well from his operation in September 2018.  A metastatic deposit of his renal cell cancer has been removed and repeat imaging studies in October 2018 showed no major changes in his malignancy except for the resected tumor.  His performance status and quality of life is not dramatically changed.   He had any headaches, blurry vision, or seizures. He does not  report any fevers or chills or sweats. Has not reported chest pain or difficulty breathing. Has not reported any nausea, vomiting or early satiety. Has not reported any hematochezia or melena. He has not reported any arthralgias or myalgias. As that report any lymphadenopathy or petechiae. Is not reporting any bleeding complications. Rest of review of systems unremarkable.  Medications: I have reviewed the patient's current medications.  Current Outpatient Medications  Medication Sig Dispense Refill  . acetaminophen (TYLENOL) 500 MG tablet Take 1 tablet (500 mg total) by mouth every 6 (six) hours as needed for mild pain, moderate pain, fever or headache. 30 tablet 0  . albuterol (PROVENTIL HFA;VENTOLIN HFA) 108 (90 BASE) MCG/ACT inhaler Inhale 2 puffs into the lungs every 6 (six) hours as needed for wheezing or shortness of breath. 1 Inhaler 0  . atorvastatin (LIPITOR) 20 MG tablet Take 20 mg by mouth daily with breakfast.     . cetirizine (ZYRTEC) 10 MG tablet Take 10 mg by mouth daily with breakfast.    . clopidogrel (PLAVIX) 75 MG tablet Take 75 mg daily by mouth.    . escitalopram (LEXAPRO) 10 MG tablet TAKE ONE TABLET BY MOUTH ONCE DAILY IN THE MORNING 90 tablet 1  . furosemide (LASIX) 20 MG tablet Take 1 tablet (20 mg total) by mouth daily. 30 tablet 0  . Melatonin 3 MG TABS Take 3 mg by mouth at bedtime as needed (for sleep).    . Multiple Vitamin (MULTIVITAMIN WITH MINERALS) TABS tablet Take 1 tablet by mouth daily with breakfast.     . pantoprazole (PROTONIX)  40 MG tablet Take 1 tablet (40 mg total) by mouth 2 (two) times daily. 60 tablet 0  . saccharomyces boulardii (FLORASTOR) 250 MG capsule Take 1 capsule (250 mg total) by mouth 2 (two) times daily. 20 capsule 0  . testosterone enanthate (DELATESTRYL) 200 MG/ML injection INJECT 1ML INTO THE MUSCLE EVERY 2 WEEKS 5 mL 5  . traMADol (ULTRAM) 50 MG tablet Take 50 mg by mouth every 6 (six) hours as needed for moderate pain.    . traZODone  (DESYREL) 100 MG tablet TAKE ONE TABLET BY MOUTH AT BEDTIME 90 tablet 1  . triamcinolone cream (KENALOG) 0.1 % Apply 1 application topically 2 (two) times daily as needed (for rash). Applies to face    . ketoconazole (NIZORAL) 2 % cream APPLY AS NEEDED TO RASH (Patient taking differently: Apply to groin or legs daily as needed for rash) 60 g 1   No current facility-administered medications for this visit.      Allergies:  Allergies  Allergen Reactions  . Penicillins Rash    Has patient had a PCN reaction causing immediate rash, facial/tongue/throat swelling, SOB or lightheadedness with hypotension: YES Has patient had a PCN reaction causing severe rash involving mucus membranes or skin necrosis: No Has patient had a PCN reaction that required hospitalization No Has patient had a PCN reaction occurring within the last 10 years: Yes  If all of the above answers are "NO", then may proceed with Cephalosporin use.    Past Medical History, Surgical history, Social history, and Family History were reviewed and updated.    Physical Exam: Blood pressure (!) 160/62, pulse 85, temperature 98.4 F (36.9 C), temperature source Oral, resp. rate 18, height 6' (1.829 m), weight 165 lb (74.8 kg), SpO2 100 %. ECOG: 2 General appearance: Alert, awake gentleman in a motorized wheelchair. Head: Normocephalic, without obvious abnormality. No oral or ulcers. Neck: no adenopathy or neck masses. Lymph nodes: Cervical, supraclavicular, and axillary nodes normal. Heart:regular rate and rhythm, S1, S2 normal, no murmur, click, rub or gallop Lung:chest clear, no wheezing, rales, no dullness to percussion.  Abdomin: soft, non-tender, without masses or organomegaly no shifting dullness or ascites. EXT: No edema noted.   Lab Results: Lab Results  Component Value Date   WBC 11.9 (H) 07/14/2017   HGB 8.6 (L) 07/14/2017   HCT 29.7 (L) 07/14/2017   MCV 80.7 07/14/2017   PLT 373 07/14/2017     Chemistry       Component Value Date/Time   NA 140 06/18/2017 0437   NA 138 03/13/2017 0922   K 3.8 06/18/2017 0437   K 4.6 03/13/2017 0922   CL 109 06/18/2017 0437   CO2 24 06/18/2017 0437   CO2 24 03/13/2017 0922   BUN 11 06/18/2017 0437   BUN 27.4 (H) 03/13/2017 0922   CREATININE 1.25 (H) 06/18/2017 0437   CREATININE 0.87 06/05/2017 1643   CREATININE 1.3 03/13/2017 0922      Component Value Date/Time   CALCIUM 8.0 (L) 06/18/2017 0437   CALCIUM 9.3 03/13/2017 0922   ALKPHOS 67 06/15/2017 1414   ALKPHOS 91 03/13/2017 0922   AST 25 06/15/2017 1414   AST 17 03/13/2017 0922   ALT 16 (L) 06/15/2017 1414   ALT 21 03/13/2017 0922   BILITOT 0.9 06/15/2017 1414   BILITOT 0.27 03/13/2017 0922     IMPRESSION: 1. Since the previous exam of 02/05/2017 the patient has undergone removal of left lower quadrant percutaneous drainage catheter and placement of a left  lower quadrant colostomy. The bowel loops have a normal caliber. There is no evidence for obstruction. 2. There is soft tissue stranding and a few small foci of gas associated with the segment of bowel entering the colostomy site. Findings may reflect recent postoperative change. Inflammation/infection not excluded. No abscess identified however. 3. Metastatic renal cell carcinoma is again noted. The dominant mass within the central abdomen has presumably been resected. Lesion within the right lower quadrant of the abdomen and lesion along the undersurface of the right lobe of liver demonstrates mild increase in size in the interval. 4.  Aortic Atherosclerosis (ICD10-I70.0).  Results for ELIAZ, FOUT" (MRN [id number]) as of 07/14/2017 09:14  Ref. Range 07/01/2017 10:17  Iron Latest Ref Range: 42 - 165 ug/dL 13 (L)  Saturation Ratios Latest Ref Range: 20.0 - 50.0 % 5.6 (L)  Transferrin Latest Ref Range: 212.0 - 360.0 mg/dL 166.0 (L)    Impression and Plan:  61 year old gentleman with the following issues:  1. Renal cell  carcinoma diagnosed with a left renal mass in July of 2013 and after a nephrectomy developed peritoneal and retroperitoneal metastasis. He was started on Votrient in May of 2015 but was discontinued due to poor tolerance.   He has been on observation and surveillance since that time.   CT scan on October 2018 was reviewed and showed no major changes in his malignancy other than the resected metastatic deposits.  At this time, I see no indication for any systemic therapy.  2. Abdominal abscess and possible fistula: He is status post colostomy with recurrent strictures.  He is currently under evaluation for possible colostomy revision.  3.  Iron deficiency anemia: He is scheduled to have IV iron infusion scheduled in the near future.  4. Prognosis: He has an incurable malignancy that is slowly progressing. His clinical status remain stable from that standpoint.  5. Followup: In 4 months.  Zola Button, MD 12/4/20189:34 AM

## 2017-07-14 NOTE — Telephone Encounter (Signed)
Gave avs and calendar for April 2019 °

## 2017-07-15 ENCOUNTER — Other Ambulatory Visit: Payer: Self-pay

## 2017-07-15 NOTE — Patient Outreach (Signed)
[facility]) Care Management  [place]  07/15/2017   Austin [name] May 15, 1956 [id number]  Subjective: Telephone call to wife for weekly call.  She reports that patient day yesterday was not good.  He had appointments and was cold but otherwise he was is ok.  She reports they are in a holding pattern for possible surgery.  Patient to see Dr. Lucia Gaskins on tomorrow  and that will see what he says. He is supposed to dilate the stoma to "15 " per wife.  She states that he complains that the site is sore but otherwise ok.  Patient continues to take medications as prescribed. Wife voices no concerns.  Objective:   Encounter Medications:  Outpatient Encounter Medications as of 07/15/2017  Medication Sig  . acetaminophen (TYLENOL) 500 MG tablet Take 1 tablet (500 mg total) by mouth every 6 (six) hours as needed for mild pain, moderate pain, fever or headache.  . albuterol (PROVENTIL HFA;VENTOLIN HFA) 108 (90 BASE) MCG/ACT inhaler Inhale 2 puffs into the lungs every 6 (six) hours as needed for wheezing or shortness of breath.  Marland Kitchen atorvastatin (LIPITOR) 20 MG tablet Take 20 mg by mouth daily with breakfast.   . cetirizine (ZYRTEC) 10 MG tablet Take 10 mg by mouth daily with breakfast.  . clopidogrel (PLAVIX) 75 MG tablet Take 75 mg daily by mouth.  . escitalopram (LEXAPRO) 10 MG tablet TAKE ONE TABLET BY MOUTH ONCE DAILY IN THE MORNING  . furosemide (LASIX) 20 MG tablet Take 1 tablet (20 mg total) by mouth daily.  Marland Kitchen ketoconazole (NIZORAL) 2 % cream APPLY AS NEEDED TO RASH (Patient taking differently: Apply to groin or legs daily as needed for rash)  . Melatonin 3 MG TABS Take 3 mg by mouth at bedtime as needed (for sleep).  . Multiple Vitamin (MULTIVITAMIN WITH MINERALS) TABS tablet Take 1 tablet by mouth daily with breakfast.   . pantoprazole (PROTONIX) 40 MG tablet Take 1 tablet (40 mg total) by mouth 2 (two) times daily.  Marland Kitchen saccharomyces boulardii (FLORASTOR) 250 MG capsule  Take 1 capsule (250 mg total) by mouth 2 (two) times daily.  Marland Kitchen testosterone enanthate (DELATESTRYL) 200 MG/ML injection INJECT 1ML INTO THE MUSCLE EVERY 2 WEEKS  . traMADol (ULTRAM) 50 MG tablet Take 50 mg by mouth every 6 (six) hours as needed for moderate pain.  . traZODone (DESYREL) 100 MG tablet TAKE ONE TABLET BY MOUTH AT BEDTIME  . triamcinolone cream (KENALOG) 0.1 % Apply 1 application topically 2 (two) times daily as needed (for rash). Applies to face   No facility-administered encounter medications on file as of 07/15/2017.     Functional Status:  In your present state of health, do you have any difficulty performing the following activities: 06/19/2017 06/15/2017  Hearing? N N  Vision? N N  Difficulty concentrating or making decisions? N N  Walking or climbing stairs? Y Y  Dressing or bathing? Y Y  Doing errands, shopping? [place]  Preparing Food and eating ? Y -  Using the Toilet? Y -  Do you have problems with loss of bowel control? N -  Managing your Medications? Y -  Managing your Finances? Y -  Housekeeping or managing your Housekeeping? Y -  Some recent data might be hidden    Fall/Depression Screening: Fall Risk  06/19/2017 06/03/2017 02/19/2017  Falls in the past year? No No No  Risk for fall due to : - - -  Risk for fall due to:  Comment - - -   PHQ 2/9 Scores 06/19/2017 06/03/2017 02/19/2017 05/25/2014  PHQ - 2 Score 0 0 0 0    Assessment: Patient continues to benefit from care manager outreach for disease management and support.    Plan:  [facility] CM Care Plan Problem One     Most Recent Value  Care Plan Problem One  at risk for readmission  Role Documenting the Problem One  Care Management Telephonic [place] for Problem One  Active  [facility] Long Term Goal   Patient will not be readmitted within the next 31 days.  [facility] Long Term Goal Start Date  06/19/17  Interventions for Problem One Long Term Goal  Discussed with wife obstruction and daily stretching of  ostomy site. Encouraged to call for questions.    [facility] CM Short Term Goal #1   Patient/caregiver will keep follow up appointmet within the next 1 week.  [facility] CM Short Term Goal #1 Start Date  06/19/17  [facility] CM Short Term Goal #1 Met Date  06/25/17  Interventions for Short Term Goal #1  Patient saw Dr. Lucia Gaskins on 06-24-17.  [facility] CM Short Term Goal #2   Patient/ caregiver will know signs of GI bleeding within 30 days.  [facility] CM Short Term Goal #2 Start Date  06/19/17  [facility] CM Short Term Goal #2 Met Date  06/30/17  Interventions for Short Term Goal #2  Wife aware of signs of GI Bleed.      RN CM will contact patient again next week for TOC call and wife/patient agrees to next outreach.    [name], RN, MSN [facility] Care Management Care Management Coordinator Direct Line [phone] Toll Free: [phone]  Fax: [phone]

## 2017-07-16 ENCOUNTER — Ambulatory Visit: Payer: Self-pay | Admitting: Surgery

## 2017-07-16 DIAGNOSIS — Z933 Colostomy status: Secondary | ICD-10-CM | POA: Diagnosis not present

## 2017-07-21 ENCOUNTER — Encounter (HOSPITAL_COMMUNITY)
Admission: RE | Admit: 2017-07-21 | Discharge: 2017-07-21 | Disposition: A | Discharge status: Home / Self Care | Payer: PPO | Source: Ambulatory Visit | Attending: Internal Medicine | Admitting: Internal Medicine

## 2017-07-21 ENCOUNTER — Encounter (HOSPITAL_COMMUNITY): Payer: Self-pay

## 2017-07-21 DIAGNOSIS — D509 Iron deficiency anemia, unspecified: Secondary | ICD-10-CM | POA: Insufficient documentation

## 2017-07-21 MED ORDER — SODIUM CHLORIDE 0.9 % IV SOLN
510.0000 mg | INTRAVENOUS | Status: DC
  Administered 2017-07-21: 510 mg via INTRAVENOUS
  Filled 2017-07-21: qty 17

## 2017-07-21 MED ORDER — SODIUM CHLORIDE 0.9 % IV SOLN
INTRAVENOUS | Status: DC
  Administered 2017-07-21: 12:00:00 via INTRAVENOUS

## 2017-07-21 NOTE — Discharge Instructions (Signed)
° °  Fereheme Ferumoxytol injection What is this medicine? FERUMOXYTOL is an iron complex. Iron is used to make healthy red blood cells, which carry oxygen and nutrients throughout the body. This medicine is used to treat iron deficiency anemia in people with chronic kidney disease. This medicine may be used for other purposes; ask your health care provider or pharmacist if you have questions. COMMON BRAND NAME(S): Feraheme What should I tell my health care provider before I take this medicine? They need to know if you have any of these conditions: -anemia not caused by low iron levels -high levels of iron in the blood -magnetic resonance imaging (MRI) test scheduled -an unusual or allergic reaction to iron, other medicines, foods, dyes, or preservatives -pregnant or trying to get pregnant -breast-feeding How should I use this medicine? This medicine is for injection into a vein. It is given by a health care professional in a hospital or clinic setting. Talk to your pediatrician regarding the use of this medicine in children. Special care may be needed. Overdosage: If you think you have taken too much of this medicine contact a poison control center or emergency room at once. NOTE: This medicine is only for you. Do not share this medicine with others. What if I miss a dose? It is important not to miss your dose. Call your doctor or health care professional if you are unable to keep an appointment. What may interact with this medicine? This medicine may interact with the following medications: -other iron products This list may not describe all possible interactions. Give your health care provider a list of all the medicines, herbs, non-prescription drugs, or dietary supplements you use. Also tell them if you smoke, drink alcohol, or use illegal drugs. Some items may interact with your medicine. What should I watch for while using this medicine? Visit your doctor or healthcare professional  regularly. Tell your doctor or healthcare professional if your symptoms do not start to get better or if they get worse. You may need blood work done while you are taking this medicine. You may need to follow a special diet. Talk to your doctor. Foods that contain iron include: whole grains/cereals, dried fruits, beans, or peas, leafy green vegetables, and organ meats (liver, kidney). What side effects may I notice from receiving this medicine? Side effects that you should report to your doctor or health care professional as soon as possible: -allergic reactions like skin rash, itching or hives, swelling of the face, lips, or tongue -breathing problems -changes in blood pressure -feeling faint or lightheaded, falls -fever or chills -flushing, sweating, or hot feelings -swelling of the ankles or feet Side effects that usually do not require medical attention (report to your doctor or health care professional if they continue or are bothersome): -diarrhea -headache -nausea, vomiting -stomach pain This list may not describe all possible side effects. Call your doctor for medical advice about side effects. You may report side effects to FDA at 1-800-FDA-1088. Where should I keep my medicine? This drug is given in a hospital or clinic and will not be stored at home. NOTE: This sheet is a summary. It may not cover all possible information. If you have questions about this medicine, talk to your doctor, pharmacist, or health care provider.  2018 Elsevier/Gold Standard (2015-08-30 12:41:49)

## 2017-07-22 ENCOUNTER — Other Ambulatory Visit: Payer: Self-pay

## 2017-07-22 NOTE — Patient Outreach (Signed)
[facility]) Care Management  [place]  07/22/2017   Austin [name] 09-03-1955 [id number]  Subjective: Telephone call to patient and wife for weekly TOC call. Wife verifies HIPAA.  She reports that patient saw Dr. Lucia Gaskins and ostomy was stretched but she will continue to stretch to 11 per day.  She reports that site is sore for patient.  Discussed ostomy stretching and trauma to site. She verbalized understanding.  Patient to have surgery at the beginning of the year to redo colostomy.  Patient had iron infusion done on yesterday.  Discussed with wife transition of care ending but continuing to call.    Objective:   Encounter Medications:  Outpatient Encounter Medications as of 07/22/2017  Medication Sig  . acetaminophen (TYLENOL) 500 MG tablet Take 1 tablet (500 mg total) by mouth every 6 (six) hours as needed for mild pain, moderate pain, fever or headache.  . albuterol (PROVENTIL HFA;VENTOLIN HFA) 108 (90 BASE) MCG/ACT inhaler Inhale 2 puffs into the lungs every 6 (six) hours as needed for wheezing or shortness of breath.  Marland Kitchen atorvastatin (LIPITOR) 20 MG tablet Take 20 mg by mouth daily with breakfast.   . cetirizine (ZYRTEC) 10 MG tablet Take 10 mg by mouth daily with breakfast.  . escitalopram (LEXAPRO) 10 MG tablet TAKE ONE TABLET BY MOUTH ONCE DAILY IN THE MORNING  . furosemide (LASIX) 20 MG tablet Take 1 tablet (20 mg total) by mouth daily.  Marland Kitchen ketoconazole (NIZORAL) 2 % cream APPLY AS NEEDED TO RASH (Patient taking differently: Apply to groin or legs daily as needed for rash)  . Melatonin 3 MG TABS Take 3 mg by mouth at bedtime as needed (for sleep).  . Multiple Vitamin (MULTIVITAMIN WITH MINERALS) TABS tablet Take 1 tablet by mouth daily with breakfast.   . pantoprazole (PROTONIX) 40 MG tablet Take 1 tablet (40 mg total) by mouth 2 (two) times daily.  Marland Kitchen saccharomyces boulardii (FLORASTOR) 250 MG capsule Take 1 capsule (250 mg total) by mouth 2 (two) times  daily.  Marland Kitchen testosterone enanthate (DELATESTRYL) 200 MG/ML injection INJECT 1ML INTO THE MUSCLE EVERY 2 WEEKS  . traMADol (ULTRAM) 50 MG tablet Take 50 mg by mouth every 6 (six) hours as needed for moderate pain.  . traZODone (DESYREL) 100 MG tablet TAKE ONE TABLET BY MOUTH AT BEDTIME  . triamcinolone cream (KENALOG) 0.1 % Apply 1 application topically 2 (two) times daily as needed (for rash). Applies to face  . clopidogrel (PLAVIX) 75 MG tablet Take 75 mg daily by mouth.   No facility-administered encounter medications on file as of 07/22/2017.     Functional Status:  In your present state of health, do you have any difficulty performing the following activities: 07/21/2017 06/19/2017  Hearing? N N  Vision? N N  Difficulty concentrating or making decisions? N N  Walking or climbing stairs? Y Y  Comment unable - partial quad -  Dressing or bathing? 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 - Y  Preparing Food and eating ? - Y  Using the Toilet? - Y  Do you have problems with loss of bowel control? - N  Managing your Medications? - Y  Managing your Finances? - Y  Housekeeping or managing your Housekeeping? - Y  Some recent data might be hidden    Fall/Depression Screening: Fall Risk  06/19/2017 06/03/2017 02/19/2017  Falls in the past year? No No No  Risk for  fall due to : - - -  Risk for fall due to: Comment - - -   PHQ 2/9 Scores 06/19/2017 06/03/2017 02/19/2017 05/25/2014  PHQ - 2 Score 0 0 0 0    Assessment: Patient continues to benefit from care manager outreach for disease management and support.    Plan: RN CM will contact patient in the month of December and patient agrees to next outreach.  [name], RN, MSN [facility] Care Management Care Management Coordinator Direct Line [phone] Toll Free: [phone]  Fax: [phone]

## 2017-07-27 ENCOUNTER — Other Ambulatory Visit: Payer: Self-pay | Admitting: Oncology

## 2017-07-28 ENCOUNTER — Encounter (HOSPITAL_COMMUNITY)
Admission: RE | Admit: 2017-07-28 | Discharge: 2017-07-28 | Disposition: A | Discharge status: Home / Self Care | Payer: PPO | Source: Ambulatory Visit | Attending: Internal Medicine | Admitting: Internal Medicine

## 2017-07-28 ENCOUNTER — Encounter (HOSPITAL_COMMUNITY): Payer: Self-pay

## 2017-07-28 DIAGNOSIS — D509 Iron deficiency anemia, unspecified: Secondary | ICD-10-CM | POA: Diagnosis not present

## 2017-07-28 MED ORDER — SODIUM CHLORIDE 0.9 % IV SOLN
INTRAVENOUS | Status: DC
  Administered 2017-07-28: 09:00:00 via INTRAVENOUS

## 2017-07-28 MED ORDER — SODIUM CHLORIDE 0.9 % IV SOLN
510.0000 mg | INTRAVENOUS | Status: AC
  Administered 2017-07-28: 510 mg via INTRAVENOUS
  Filled 2017-07-28: qty 17

## 2017-07-28 NOTE — Discharge Instructions (Signed)
°  Feraheme °Ferumoxytol injection °What is this medicine? °FERUMOXYTOL is an iron complex. Iron is used to make healthy red blood cells, which carry oxygen and nutrients throughout the body. This medicine is used to treat iron deficiency anemia in people with chronic kidney disease. °This medicine may be used for other purposes; ask your health care provider or pharmacist if you have questions. °COMMON BRAND NAME(S): Feraheme °What should I tell my health care provider before I take this medicine? °They need to know if you have any of these conditions: °-anemia not caused by low iron levels °-high levels of iron in the blood °-magnetic resonance imaging (MRI) test scheduled °-an unusual or allergic reaction to iron, other medicines, foods, dyes, or preservatives °-pregnant or trying to get pregnant °-breast-feeding °How should I use this medicine? °This medicine is for injection into a vein. It is given by a health care professional in a hospital or clinic setting. °Talk to your pediatrician regarding the use of this medicine in children. Special care may be needed. °Overdosage: If you think you have taken too much of this medicine contact a poison control center or emergency room at once. °NOTE: This medicine is only for you. Do not share this medicine with others. °What if I miss a dose? °It is important not to miss your dose. Call your doctor or health care professional if you are unable to keep an appointment. °What may interact with this medicine? °This medicine may interact with the following medications: °-other iron products °This list may not describe all possible interactions. Give your health care provider a list of all the medicines, herbs, non-prescription drugs, or dietary supplements you use. Also tell them if you smoke, drink alcohol, or use illegal drugs. Some items may interact with your medicine. °What should I watch for while using this medicine? °Visit your doctor or healthcare professional  regularly. Tell your doctor or healthcare professional if your symptoms do not start to get better or if they get worse. You may need blood work done while you are taking this medicine. °You may need to follow a special diet. Talk to your doctor. Foods that contain iron include: whole grains/cereals, dried fruits, beans, or peas, leafy green vegetables, and organ meats (liver, kidney). °What side effects may I notice from receiving this medicine? °Side effects that you should report to your doctor or health care professional as soon as possible: °-allergic reactions like skin rash, itching or hives, swelling of the face, lips, or tongue °-breathing problems °-changes in blood pressure °-feeling faint or lightheaded, falls °-fever or chills °-flushing, sweating, or hot feelings °-swelling of the ankles or feet °Side effects that usually do not require medical attention (report to your doctor or health care professional if they continue or are bothersome): °-diarrhea °-headache °-nausea, vomiting °-stomach pain °This list may not describe all possible side effects. Call your doctor for medical advice about side effects. You may report side effects to FDA at 1-800-FDA-1088. °Where should I keep my medicine? °This drug is given in a hospital or clinic and will not be stored at home. °NOTE: This sheet is a summary. It may not cover all possible information. If you have questions about this medicine, talk to your doctor, pharmacist, or health care provider. °© 2018 Elsevier/Gold Standard (2015-08-30 12:41:49) ° °

## 2017-07-28 NOTE — Progress Notes (Signed)
Patient completed second dose of Feraheme and tolerated well. He and wife declined d/c papers/instructions today as said they had theirs from last week at home. Aware of when to call MD if needed.

## 2017-08-03 ENCOUNTER — Other Ambulatory Visit: Payer: Self-pay

## 2017-08-03 NOTE — Patient Outreach (Signed)
[facility]) Care Management  [place]  08/03/2017   DWAN HEMMELGARN Jun 27, 1956 [id number]  Subjective: Telephone call to wife/caregiver for check in call.  She reports that the patient is doing the same [place] same ole.  She reports that patient waiting for surgery and that it is now scheduled for January 10th and that they have a pre-op appointment on January 4th.  She continues to stretch the stoma to maintain patency of the stoma site.  She reports that he continues to have soreness from the stretching.    Discussed with wife GI bleeding and she denies any active signs of bleeding.  No concerns voiced.    Objective:   Encounter Medications:  Outpatient Encounter Medications as of 08/03/2017  Medication Sig  . acetaminophen (TYLENOL) 500 MG tablet Take 1 tablet (500 mg total) by mouth every 6 (six) hours as needed for mild pain, moderate pain, fever or headache.  . albuterol (PROVENTIL HFA;VENTOLIN HFA) 108 (90 BASE) MCG/ACT inhaler Inhale 2 puffs into the lungs every 6 (six) hours as needed for wheezing or shortness of breath.  Marland Kitchen atorvastatin (LIPITOR) 20 MG tablet Take 20 mg by mouth daily with breakfast.   . cetirizine (ZYRTEC) 10 MG tablet Take 10 mg by mouth daily with breakfast.  . clopidogrel (PLAVIX) 75 MG tablet Take 75 mg daily by mouth.  . escitalopram (LEXAPRO) 10 MG tablet TAKE ONE TABLET BY MOUTH ONCE DAILY IN THE MORNING  . furosemide (LASIX) 20 MG tablet Take 1 tablet (20 mg total) by mouth daily.  Marland Kitchen ketoconazole (NIZORAL) 2 % cream APPLY AS NEEDED TO RASH (Patient taking differently: Apply to groin or legs daily as needed for rash)  . Melatonin 3 MG TABS Take 3 mg by mouth at bedtime as needed (for sleep).  . Multiple Vitamin (MULTIVITAMIN WITH MINERALS) TABS tablet Take 1 tablet by mouth daily with breakfast.   . pantoprazole (PROTONIX) 40 MG tablet Take 1 tablet (40 mg total) by mouth 2 (two) times daily.  Marland Kitchen saccharomyces boulardii (FLORASTOR)  250 MG capsule Take 1 capsule (250 mg total) by mouth 2 (two) times daily.  Marland Kitchen testosterone enanthate (DELATESTRYL) 200 MG/ML injection INJECT 1ML INTO THE MUSCLE EVERY 2 WEEKS  . traMADol (ULTRAM) 50 MG tablet Take 1 tablet (50 mg total) by mouth every 6 (six) hours as needed.  . traZODone (DESYREL) 100 MG tablet TAKE ONE TABLET BY MOUTH AT BEDTIME  . triamcinolone cream (KENALOG) 0.1 % Apply 1 application topically 2 (two) times daily as needed (for rash). Applies to face   No facility-administered encounter medications on file as of 08/03/2017.     Functional Status:  In your present state of health, do you have any difficulty performing the following activities: 07/21/2017 06/19/2017  Hearing? N N  Vision? N N  Difficulty concentrating or making decisions? N N  Walking or climbing stairs? Y Y  Comment unable - partial quad -  Dressing or bathing? 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 - Y  Preparing Food and eating ? - Y  Using the Toilet? - Y  Do you have problems with loss of bowel control? - N  Managing your Medications? - Y  Managing your Finances? - Y  Housekeeping or managing your Housekeeping? - Y  Some recent data might be hidden    Fall/Depression Screening: Fall Risk  06/19/2017 06/03/2017 02/19/2017  Falls in the past year? No  No No  Risk for fall due to : - - -  Risk for fall due to: Comment - - -   PHQ 2/9 Scores 06/19/2017 06/03/2017 02/19/2017 05/25/2014  PHQ - 2 Score 0 0 0 0    Assessment: Patient continues to benefit from care manager outreach for disease management and support.    Plan:  [facility] CM Care Plan Problem One     Most Recent Value  Care Plan Problem One  at risk for readmission  Role Documenting the Problem One  Care Management Telephonic [place] for Problem One  Active  [facility] Long Term Goal   Patient will not be readmitted within the next 31 days.  [facility] Long Term Goal Start Date   06/19/17  [facility] Long Term Goal Met Date  07/22/17  Interventions for Problem One Long Term Goal  no admissions  [facility] CM Short Term Goal #1   Patient will maintain patency of ostomy site within the next 30 days.  [facility] CM Short Term Goal #1 Start Date  07/22/17  Interventions for Short Term Goal #1  RN CM reviewed with wife daily stretching of ostomy site and bleeding precautions.    [facility] CM Short Term Goal #2   Patient/ caregiver will know signs of GI bleeding within 30 days.  [facility] CM Short Term Goal #2 Start Date  06/19/17  [facility] CM Short Term Goal #2 Met Date  06/30/17  Interventions for Short Term Goal #2  Wife aware of signs of GI Bleed.      RN CM will contact caregiver/patient in the month of January and caregiver/patient agrees to next outreach.  [name], RN, MSN [facility] Care Management Care Management Coordinator Direct Line [phone] Toll Free: [phone]  Fax: [phone]

## 2017-08-09 DIAGNOSIS — Z933 Colostomy status: Secondary | ICD-10-CM | POA: Diagnosis not present

## 2017-08-11 DIAGNOSIS — Z933 Colostomy status: Secondary | ICD-10-CM | POA: Diagnosis not present

## 2017-08-12 NOTE — Patient Instructions (Signed)
Austin [name]  08/12/2017   Your procedure is scheduled on: 08-20-17  Report to [facility] Main  Entrance    Report to admitting at 7:00AM   Call this number if you have problems the morning of surgery [phone]   Remember: ONLY 1 PERSON MAY GO WITH YOU TO SHORT STAY TO GET  READY MORNING OF YOUR SURGERY.    No solids after midnight on Tuesday 08-18-17. Drink plenty of clear liquids all day Wednesday 08-19-17 up until 6:00AM morning of surgery. Please follow all bowel prep instructions provided by your surgeon and finish carbohydrate drink per surgeon order.      Take these medicines the morning of surgery with A SIP OF WATER: escitalopram, atorvastatin, pantoprazole, tylenol if needed, inhaler if needed (may bring to hospital)                                  You may not have any metal on your body including hair pins and              piercings  Do not wear jewelry, make-up, lotions, powders or perfumes, deodorant                  Men may shave face and neck.   Do not bring valuables to the hospital. [place].  Contacts, dentures or bridgework may not be worn into surgery.  Leave suitcase in the car. After surgery it may be brought to your room.                 Please read over the following fact sheets you were given: _____________________________________________________________________    CLEAR LIQUID DIET   Foods Allowed                                                                     Foods Excluded  Coffee and tea, regular and decaf                             liquids that you cannot  Plain Jell-O in any flavor                                             see through such as: Fruit ices (not with fruit pulp)                                     milk, soups, orange juice  Iced Popsicles                                    All solid food Carbonated beverages, regular and diet  Cranberry, grape and apple juices Sports drinks like Gatorade Lightly seasoned clear broth or consume(fat free) Sugar, honey syrup  Sample Menu Breakfast                                Lunch                                     Supper Cranberry juice                    Beef broth                            Chicken broth Jell-O                                     Grape juice                           Apple juice Coffee or tea                        Jell-O                                      Popsicle                                                Coffee or tea                        Coffee or tea  _____________________________________________________________________  [facility] - Preparing for Surgery Before surgery, you can play an important role.  Because skin is not sterile, your skin needs to be as free of germs as possible.  You can reduce the number of germs on your skin by washing with CHG (chlorahexidine gluconate) soap before surgery.  CHG is an antiseptic cleaner which kills germs and bonds with the skin to continue killing germs even after washing. Please DO NOT use if you have an allergy to CHG or antibacterial soaps.  If your skin becomes reddened/irritated stop using the CHG and inform your nurse when you arrive at Short Stay. Do not shave (including legs and underarms) for at least 48 hours prior to the first CHG shower.  You may shave your face/neck. Please follow these instructions carefully:  1.  Shower with CHG Soap the night before surgery and the  morning of Surgery.  2.  If you choose to wash your hair, wash your hair first as usual with your  normal  shampoo.  3.  After you shampoo, rinse your hair and body thoroughly to remove the  shampoo.                           4.  Use CHG as you would any other liquid soap.  You can apply chg directly  to the skin and wash  Gently with a scrungie or clean washcloth.  5.  Apply the CHG Soap to  your body ONLY FROM THE NECK DOWN.   Do not use on face/ open                           Wound or open sores. Avoid contact with eyes, ears mouth and genitals (private parts).                       Wash face,  Genitals (private parts) with your normal soap.             6.  Wash thoroughly, paying special attention to the area where your surgery  will be performed.  7.  Thoroughly rinse your body with warm water from the neck down.  8.  DO NOT shower/wash with your normal soap after using and rinsing off  the CHG Soap.                9.  Pat yourself dry with a clean towel.            10.  Wear clean pajamas.            11.  Place clean sheets on your bed the night of your first shower and do not  sleep with pets. Day of Surgery : Do not apply any lotions/deodorants the morning of surgery.  Please wear clean clothes to the hospital/surgery center.  FAILURE TO FOLLOW THESE INSTRUCTIONS MAY RESULT IN THE CANCELLATION OF YOUR SURGERY PATIENT SIGNATURE_________________________________  NURSE SIGNATURE__________________________________  ________________________________________________________________________   Austin [name]  An incentive spirometer is a tool that can help keep your lungs clear and active. This tool measures how well you are filling your lungs with each breath. Taking long deep breaths may help reverse or decrease the chance of developing breathing (pulmonary) problems (especially infection) following:  A long period of time when you are unable to move or be active. BEFORE THE PROCEDURE   If the spirometer includes an indicator to show your best effort, your nurse or respiratory therapist will set it to a desired goal.  If possible, sit up straight or lean slightly forward. Try not to slouch.  Hold the incentive spirometer in an upright position. INSTRUCTIONS FOR USE  1. Sit on the edge of your bed if possible, or sit up as far as you can in bed or on a  chair. 2. Hold the incentive spirometer in an upright position. 3. Breathe out normally. 4. Place the mouthpiece in your mouth and seal your lips tightly around it. 5. Breathe in slowly and as deeply as possible, raising the piston or the ball toward the top of the column. 6. Hold your breath for 3-5 seconds or for as long as possible. Allow the piston or ball to fall to the bottom of the column. 7. Remove the mouthpiece from your mouth and breathe out normally. 8. Rest for a few seconds and repeat Steps 1 through 7 at least 10 times every 1-2 hours when you are awake. Take your time and take a few normal breaths between deep breaths. 9. The spirometer may include an indicator to show your best effort. Use the indicator as a goal to work toward during each repetition. 10. After each set of 10 deep breaths, practice coughing to be sure your lungs are clear. If you have an incision (the cut made at the time of  surgery), support your incision when coughing by placing a pillow or rolled up towels firmly against it. Once you are able to get out of bed, walk around indoors and cough well. You may stop using the incentive spirometer when instructed by your caregiver.  RISKS AND COMPLICATIONS  Take your time so you do not get dizzy or light-headed.  If you are in pain, you may need to take or ask for pain medication before doing incentive spirometry. It is harder to take a deep breath if you are having pain. AFTER USE  Rest and breathe slowly and easily.  It can be helpful to keep track of a log of your progress. Your caregiver can provide you with a simple table to help with this. If you are using the spirometer at home, follow these instructions: [place] IF:   You are having difficultly using the spirometer.  You have trouble using the spirometer as often as instructed.  Your pain medication is not giving enough relief while using the spirometer.  You develop fever of 100.5 F  (38.1 C) or higher. SEEK IMMEDIATE MEDICAL CARE IF:   You cough up bloody sputum that had not been present before.  You develop fever of 102 F (38.9 C) or greater.  You develop worsening pain at or near the incision site. MAKE SURE YOU:   Understand these instructions.  Will watch your condition.  Will get help right away if you are not doing well or get worse. Document Released: 12/08/2006 Document Revised: 10/20/2011 Document Reviewed: 02/08/2007 [facility] Patient Information 2014 [place], [place].   ________________________________________________________________________

## 2017-08-12 NOTE — Progress Notes (Signed)
EKG 05-29-17 Epic   CXR 05-13-17 EPIC

## 2017-08-14 ENCOUNTER — Encounter (HOSPITAL_COMMUNITY)
Admission: RE | Admit: 2017-08-14 | Discharge: 2017-08-14 | Disposition: A | Discharge status: Home / Self Care | Payer: PPO | Source: Ambulatory Visit | Attending: Surgery | Admitting: Surgery

## 2017-08-14 ENCOUNTER — Other Ambulatory Visit: Payer: Self-pay

## 2017-08-14 ENCOUNTER — Encounter (HOSPITAL_COMMUNITY): Payer: Self-pay

## 2017-08-14 DIAGNOSIS — E119 Type 2 diabetes mellitus without complications: Secondary | ICD-10-CM | POA: Insufficient documentation

## 2017-08-14 DIAGNOSIS — Z01812 Encounter for preprocedural laboratory examination: Secondary | ICD-10-CM | POA: Diagnosis not present

## 2017-08-14 DIAGNOSIS — D649 Anemia, unspecified: Secondary | ICD-10-CM | POA: Diagnosis not present

## 2017-08-14 DIAGNOSIS — Z87891 Personal history of nicotine dependence: Secondary | ICD-10-CM | POA: Insufficient documentation

## 2017-08-14 DIAGNOSIS — K9403 Colostomy malfunction: Secondary | ICD-10-CM | POA: Diagnosis not present

## 2017-08-14 DIAGNOSIS — I10 Essential (primary) hypertension: Secondary | ICD-10-CM | POA: Diagnosis not present

## 2017-08-14 DIAGNOSIS — N289 Disorder of kidney and ureter, unspecified: Secondary | ICD-10-CM | POA: Diagnosis not present

## 2017-08-14 HISTORY — DX: Other specified postprocedural states: Z98.890

## 2017-08-14 HISTORY — DX: Prediabetes: R73.03

## 2017-08-14 HISTORY — DX: Other specified postprocedural states: R11.2

## 2017-08-14 LAB — CBC WITH DIFFERENTIAL/PLATELET
BASOS PCT: 1 %
Basophils Absolute: 0.1 10*3/uL (ref 0.0–0.1)
Eosinophils Absolute: 0.3 10*3/uL (ref 0.0–0.7)
Eosinophils Relative: 4 %
HCT: 35.3 % — ABNORMAL LOW (ref 39.0–52.0)
HEMOGLOBIN: 10.6 g/dL — AB (ref 13.0–17.0)
LYMPHS ABS: 1 10*3/uL (ref 0.7–4.0)
Lymphocytes Relative: 12 %
MCH: 25.4 pg — AB (ref 26.0–34.0)
MCHC: 30 g/dL (ref 30.0–36.0)
MCV: 84.7 fL (ref 78.0–100.0)
MONO ABS: 0.8 10*3/uL (ref 0.1–1.0)
Monocytes Relative: 10 %
NEUTROS ABS: 5.8 10*3/uL (ref 1.7–7.7)
NEUTROS PCT: 73 %
PLATELETS: 230 10*3/uL (ref 150–400)
RBC: 4.17 MIL/uL — ABNORMAL LOW (ref 4.22–5.81)
RDW: 24.4 % — ABNORMAL HIGH (ref 11.5–15.5)
WBC: 8 10*3/uL (ref 4.0–10.5)

## 2017-08-14 LAB — BASIC METABOLIC PANEL
Anion gap: 6 (ref 5–15)
BUN: 19 mg/dL (ref 6–20)
CALCIUM: 8.8 mg/dL — AB (ref 8.9–10.3)
CO2: 26 mmol/L (ref 22–32)
Chloride: 107 mmol/L (ref 101–111)
Creatinine, Ser: 1.23 mg/dL (ref 0.61–1.24)
GFR calc Af Amer: >60 mL/min (ref >60)
Glucose, Bld: 99 mg/dL (ref 65–99)
Potassium: 4.5 mmol/L (ref 3.5–5.1)
SODIUM: 139 mmol/L (ref 135–145)

## 2017-08-14 LAB — HEMOGLOBIN A1C
HEMOGLOBIN A1C: 5.3 % (ref 4.8–5.6)
Mean Plasma Glucose: 105.41 mg/dL

## 2017-08-17 ENCOUNTER — Other Ambulatory Visit: Payer: Self-pay

## 2017-08-17 ENCOUNTER — Telehealth: Payer: Self-pay | Admitting: Internal Medicine

## 2017-08-17 NOTE — Telephone Encounter (Signed)
Patient's wife notified that he is not due for iron studies for 3 months post 2nd feraheme which would be March.

## 2017-08-17 NOTE — Patient Outreach (Signed)
[facility]) Care Management  [place]  08/17/2017   Austin [name] April 24, 1956 [id number]  Subjective: Telephone call to patient/caregiver for follow up call.  Wife able to verify HIPAA.  She reports that patient has felt tired and weak but is preparing for surgery.  She states they have his pre-op on Wednesday and surgery on Thursday.  Wife shared that patient is doing what they call enhanced recovery where he drink these high calorie drinks prior to surgery.  She also shares that patient will be getting out of bed sooner and being up most of the day.  She states they like the idea due to patient quad status.  Daily stretching of the stoma continues and not sure if he will have the same stoma site.    Wife voices no concerns.    Objective:   Encounter Medications:  Outpatient Encounter Medications as of 08/17/2017  Medication Sig Note  . acetaminophen (TYLENOL) 500 MG tablet Take 1 tablet (500 mg total) by mouth every 6 (six) hours as needed for mild pain, moderate pain, fever or headache.   . albuterol (PROVENTIL HFA;VENTOLIN HFA) 108 (90 BASE) MCG/ACT inhaler Inhale 2 puffs into the lungs every 6 (six) hours as needed for wheezing or shortness of breath.   Marland Kitchen atorvastatin (LIPITOR) 20 MG tablet Take 20 mg by mouth daily with breakfast.    . cetirizine (ZYRTEC) 10 MG tablet Take 10 mg by mouth daily with breakfast.   . escitalopram (LEXAPRO) 10 MG tablet TAKE ONE TABLET BY MOUTH ONCE DAILY IN THE MORNING (Patient taking differently: TAKE 10 MG BY MOUTH ONCE DAILY IN THE MORNING)   . furosemide (LASIX) 20 MG tablet Take 1 tablet (20 mg total) by mouth daily. (Patient taking differently: Take 20 mg by mouth daily as needed for fluid. )   . ketoconazole (NIZORAL) 2 % cream APPLY AS NEEDED TO RASH (Patient taking differently: Apply to groin or legs daily as needed for rash)   . Melatonin 3 MG TABS Take 3 mg by mouth at bedtime as needed (for sleep).   . pantoprazole  (PROTONIX) 40 MG tablet Take 1 tablet (40 mg total) by mouth 2 (two) times daily.   Marland Kitchen saccharomyces boulardii (FLORASTOR) 250 MG capsule Take 1 capsule (250 mg total) by mouth 2 (two) times daily.   Marland Kitchen testosterone enanthate (DELATESTRYL) 200 MG/ML injection INJECT 1ML INTO THE MUSCLE EVERY 2 WEEKS (Patient taking differently: INJECT 200 MG INTO THE MUSCLE EVERY 2 WEEKS)   . traMADol (ULTRAM) 50 MG tablet Take 1 tablet (50 mg total) by mouth every 6 (six) hours as needed. (Patient taking differently: Take 50 mg by mouth every 6 (six) hours as needed for moderate pain. )   . traZODone (DESYREL) 100 MG tablet TAKE ONE TABLET BY MOUTH AT BEDTIME (Patient taking differently: TAKE 100 MG BY MOUTH AT BEDTIME)   . triamcinolone cream (KENALOG) 0.1 % Apply 1 application topically 2 (two) times daily as needed (for rash). Applies to face   . clopidogrel (PLAVIX) 75 MG tablet Take 75 mg daily by mouth. 08/05/2017: Stopped since October 2018 per MD  . Multiple Vitamin (MULTIVITAMIN WITH MINERALS) TABS tablet Take 1 tablet by mouth daily with breakfast.  08/17/2017: Not taking for surgery   No facility-administered encounter medications on file as of 08/17/2017.     Functional Status:  In your present state of health, do you have any difficulty performing the following activities: 08/14/2017 07/21/2017  Hearing? N  N  Vision? N N  Difficulty concentrating or making decisions? N N  Walking or climbing stairs? Y Y  Comment - unable - partial quad  Dressing or bathing? 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 N -  Preparing Food and eating ? - -  Using the Toilet? - -  Do you have problems with loss of bowel control? - -  Managing your Medications? - -  Managing your Finances? - -  Housekeeping or managing your Housekeeping? - -  Some recent data might be hidden    Fall/Depression Screening: Fall Risk  06/19/2017 06/03/2017 02/19/2017  Falls in the past year?  No No No  Risk for fall due to : - - -  Risk for fall due to: Comment - - -   PHQ 2/9 Scores 06/19/2017 06/03/2017 02/19/2017 05/25/2014  PHQ - 2 Score 0 0 0 0    Assessment: Patient continues to benefit from care manager outreach for disease management and support.    Plan:  [facility] CM Care Plan Problem One     Most Recent Value  Care Plan Problem One  at risk for readmission  Role Documenting the Problem One  Care Management Telephonic [place] for Problem One  Active  [facility] Long Term Goal   Patient will not be readmitted within the next 31 days.  [facility] Long Term Goal Start Date  06/19/17  [facility] Long Term Goal Met Date  07/22/17  Interventions for Problem One Long Term Goal  no admissions  [facility] CM Short Term Goal #1   Patient will maintain patency of ostomy site within the next 30 days.  [facility] CM Short Term Goal #1 Start Date  08/17/17  Interventions for Short Term Goal #1  RN CM discussed with wife daily stretching of ostomy site and bleeding precautions.    [facility] CM Short Term Goal #2   Patient/ caregiver will know signs of GI bleeding within 30 days.  [facility] CM Short Term Goal #2 Start Date  06/19/17  [facility] CM Short Term Goal #2 Met Date  06/30/17  Interventions for Short Term Goal #2  Wife aware of signs of GI Bleed.      RN CM will contact patient/caregiver post hospitalization and patient/caregiver agrees to next outreach.  [name], RN, MSN [facility] Care Management Care Management Coordinator Direct Line [phone] Toll Free: [phone]  Fax: [phone]

## 2017-08-19 NOTE — H&P (Signed)
Austin [name]  Location: [facility] Patient #: [id number] DOB: March 30, 1956 Married / Language: English / Race: White Male  History of Present Illness   The patient is a 62 year old male who presents with a complaint of recurrent diverticular abscess.  The PCP is Dr. Girtha Hake  GI - Dr. Hilarie Fredrickson  Oncology - Dr. Jerilynn Mages HIs wife is with him.  I did a lap sigmoid colectomy with end colostomy - 05/04/2017 for persistent colo-cutaneous fistula. He has had a complicated post op course since surgery Hospitalized from 05/20/2017 for upper GI bleed secondary to ulcer and Plavix. Hospitalized from 11/5 - 06/18/2017 for stricture of colostomy His GI is Dr. Hilarie Fredrickson. He also sees his wife. He has developed a stricture at the ostomy. His wife dilates this with Hagar dilators that his wife bought. She can get up to a 12. With local anesthesia, I was able to dilate him to 14 dilator today.  Plan: 1) Though his wife can dilate the stoma to an 11 or 12 size with the Hagar dilators, he is interested in revising the colostomy. 2) Plan revision of the colostomy after the holidays.  Past Medical History: 1. Sigmoid colectomy with end colostomy - 05/04/2017 - D. Batul Diego  For Diverticulitis with persistent colo-cutaneous fistula 1A. 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 Post op GI bleed from gastric ulcer - while on Plavix 3. C. Diff - recurrent 4. Recurrent Metastatic renal cell cancer, s/p left radical nephrectomy 04/23/2012 - Dr. Tresa Moore Peritoneal metastasis 2015 Followed by Dr. Jerilynn Mages 5. 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 Creatinine - 1.26 - 01/27/2017 7. Was on Plavix Now off because of GI bleed 8. DM - on no meds now - he has lost weight with all of his illnesses 9. Pituitary tumor in 1994 10. Prior cholecystectomy  Social History: Wife is with him.  She has Crohn's disease and sees Dr. Hilarie Fredrickson. He has 3 children. I have met Heather. He has two other children - who live in [place] (?)  His brother died of colon cancer. His mother and sister are living   Allergies Alean Rinne, [place]; 07/16/2017 8:31 AM) Penicillin G Benzathine & Proc *PENICILLINS*  Allergies Reconciled   Medication History Alean Rinne, [place]; 07/16/2017 8:33 AM) Neomycin Sulfate (500MG Tablet, 2 (two) Tablet Oral SEE NOTE, Taken starting 04/23/2017) Active. (TAKE TWO TABLETS AT 2 PM, 3 PM, AND 10 PM THE DAY PRIOR TO SURGERY) Atorvastatin Calcium (20MG Tablet, Oral) Active. Testosterone Enanthate (200MG/ML Solution, Intramuscular) Active. Escitalopram Oxalate (10MG Tablet, Oral) Active. Ketoconazole (2% Cream, External) Active. Lisinopril-Hydrochlorothiazide (20-12.5MG Tablet, Oral) Active. TraZODone HCl (100MG Tablet, Oral) Active. Pantoprazole Sodium (40MG Tablet DR, Oral) Active. Furosemide (20MG Tablet, Oral) Active. LevoFLOXacin (500MG Tablet, Oral) Active. Medications Reconciled  Vitals Alean Rinne RMA; 07/16/2017 8:31 AM) 07/16/2017 8:31 AM Weight: 165 lb Height: 72in Body Surface Area: 1.96 m Body Mass Index: 22.38 kg/m  Temp.: 40F  Pulse: 98 (Regular)  BP: 140/72 (Sitting, Left Arm, Standard)   Physical Exam  General: WM who is wheel chair bound alert. We moved him to the exam table. HEENT: Normal. Pupils equal.  Neck: Supple. No mass. No thyroid mass.  Lymph Nodes: No supraclavicular or cervical nodes.  Lungs: Clear to auscultation and symmetric breath sounds. Heart: RRR. No murmur or rub.  Abdomen: Soft. No mass. No tenderness. Normal bowel sounds.  Colostomy in left upper abdomen - I placed a block around the  ostomy with 6 cc of 1% xylocaine. I dilated the ostomy to 14. I could then admit the tip of my 5th digit into the ostomy. He flet some pain sensation with this.  Extremities: Wheel  chair bound. Contracted left hand/arm, limited motion of right hand  Psychiatric: Has normal mood. His speech pattern is hard to follow, but his wife does a good job of interpreting his speech.   Assessment & Plan  1.  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  [place] (K57.20)  Story: Lap sigmoid colectomy with end colostomy - 05/04/2017 - D. Zell Hylton 3.  METASTATIC RENAL CELL CARCINOMA TO INTRA-ABDOMINAL SITE (C79.89)  s/p left radical nephrectomy 04/23/2012 - Dr. Tresa Moore  Peritoneal metastasis 2015 Followed by Dr. Jerilynn Mages 4.  ANTICOAGULATED (Z79.01)  Impression: His off the Plavix now 5.  QUADRIPLEGIA (G82.50)  Story: brain stem stroke in 1993  Wheelchair bound - his wife takes good care of him  5. 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 Pituitary tumor in [place], MD, [facility] Surgery Pager: [phone] Office phone:  [phone]

## 2017-08-20 ENCOUNTER — Inpatient Hospital Stay (HOSPITAL_COMMUNITY): Payer: PPO | Admitting: Anesthesiology

## 2017-08-20 ENCOUNTER — Other Ambulatory Visit: Payer: Self-pay

## 2017-08-20 ENCOUNTER — Encounter (HOSPITAL_COMMUNITY): Payer: Self-pay | Admitting: Emergency Medicine

## 2017-08-20 ENCOUNTER — Encounter (HOSPITAL_COMMUNITY)
Admission: RE | Disposition: A | Discharge status: Home / Self Care | Payer: Self-pay | Source: Ambulatory Visit | Attending: Surgery

## 2017-08-20 ENCOUNTER — Inpatient Hospital Stay (HOSPITAL_COMMUNITY)
Admission: RE | Admit: 2017-08-20 | Discharge: 2017-08-24 | DRG: 329 | Disposition: A | Discharge status: Home / Self Care | Payer: PPO | Source: Ambulatory Visit | Attending: Surgery | Admitting: Surgery

## 2017-08-20 DIAGNOSIS — Z800 Family history of malignant neoplasm of digestive organs: Secondary | ICD-10-CM

## 2017-08-20 DIAGNOSIS — K660 Peritoneal adhesions (postprocedural) (postinfection): Secondary | ICD-10-CM | POA: Diagnosis present

## 2017-08-20 DIAGNOSIS — Z8673 Personal history of transient ischemic attack (TIA), and cerebral infarction without residual deficits: Secondary | ICD-10-CM | POA: Diagnosis not present

## 2017-08-20 DIAGNOSIS — Y833 Surgical operation with formation of external stoma as the cause of abnormal reaction of the patient, or of later complication, without mention of misadventure at the time of the procedure: Secondary | ICD-10-CM | POA: Diagnosis present

## 2017-08-20 DIAGNOSIS — Z85528 Personal history of other malignant neoplasm of kidney: Secondary | ICD-10-CM | POA: Diagnosis not present

## 2017-08-20 DIAGNOSIS — Z87891 Personal history of nicotine dependence: Secondary | ICD-10-CM

## 2017-08-20 DIAGNOSIS — Z79899 Other long term (current) drug therapy: Secondary | ICD-10-CM | POA: Diagnosis not present

## 2017-08-20 DIAGNOSIS — Z905 Acquired absence of kidney: Secondary | ICD-10-CM

## 2017-08-20 DIAGNOSIS — K9403 Colostomy malfunction: Secondary | ICD-10-CM | POA: Diagnosis not present

## 2017-08-20 DIAGNOSIS — Z993 Dependence on wheelchair: Secondary | ICD-10-CM

## 2017-08-20 DIAGNOSIS — I10 Essential (primary) hypertension: Secondary | ICD-10-CM | POA: Diagnosis not present

## 2017-08-20 DIAGNOSIS — E119 Type 2 diabetes mellitus without complications: Secondary | ICD-10-CM | POA: Diagnosis not present

## 2017-08-20 DIAGNOSIS — K9409 Other complications of colostomy: Secondary | ICD-10-CM | POA: Diagnosis not present

## 2017-08-20 DIAGNOSIS — Z9049 Acquired absence of other specified parts of digestive tract: Secondary | ICD-10-CM | POA: Diagnosis not present

## 2017-08-20 DIAGNOSIS — G8250 Quadriplegia, unspecified: Secondary | ICD-10-CM | POA: Diagnosis not present

## 2017-08-20 DIAGNOSIS — E785 Hyperlipidemia, unspecified: Secondary | ICD-10-CM | POA: Diagnosis not present

## 2017-08-20 DIAGNOSIS — Z85038 Personal history of other malignant neoplasm of large intestine: Secondary | ICD-10-CM | POA: Diagnosis not present

## 2017-08-20 HISTORY — PX: COLOSTOMY TAKEDOWN: SHX5258

## 2017-08-20 LAB — TYPE AND SCREEN
ABO/RH(D): O POS
Antibody Screen: NEGATIVE

## 2017-08-20 SURGERY — CLOSURE, COLOSTOMY, LAPAROSCOPIC
Anesthesia: General | Site: Abdomen

## 2017-08-20 MED ORDER — CHLORHEXIDINE GLUCONATE 4 % EX LIQD: 60.0000 mL | Freq: Once | CUTANEOUS | Status: DC

## 2017-08-20 MED ORDER — DEXAMETHASONE SODIUM PHOSPHATE 10 MG/ML IJ SOLN
INTRAMUSCULAR | Status: AC
  Filled 2017-08-20: qty 1

## 2017-08-20 MED ORDER — ACETAMINOPHEN 500 MG PO TABS
1000.0000 mg | ORAL_TABLET | ORAL | Status: AC
  Administered 2017-08-20: 1000 mg via ORAL
  Filled 2017-08-20: qty 2

## 2017-08-20 MED ORDER — CEFOTETAN DISODIUM-DEXTROSE 2-2.08 GM-%(50ML) IV SOLR
2.0000 g | INTRAVENOUS | Status: AC
  Administered 2017-08-20: 2 g via INTRAVENOUS
  Filled 2017-08-20: qty 50

## 2017-08-20 MED ORDER — FENTANYL CITRATE (PF) 100 MCG/2ML IJ SOLN
INTRAMUSCULAR | Status: DC | PRN
  Administered 2017-08-20 (×7): 50 ug via INTRAVENOUS

## 2017-08-20 MED ORDER — HEPARIN SODIUM (PORCINE) 5000 UNIT/ML IJ SOLN
5000.0000 [IU] | Freq: Three times a day (TID) | INTRAMUSCULAR | Status: DC
  Administered 2017-08-21 – 2017-08-24 (×10): 5000 [IU] via SUBCUTANEOUS
  Filled 2017-08-20 (×10): qty 1

## 2017-08-20 MED ORDER — ONDANSETRON HCL 4 MG/2ML IJ SOLN
INTRAMUSCULAR | Status: DC | PRN
  Administered 2017-08-20: 4 mg via INTRAVENOUS

## 2017-08-20 MED ORDER — BUPIVACAINE-EPINEPHRINE 0.25% -1:200000 IJ SOLN
INTRAMUSCULAR | Status: AC
  Filled 2017-08-20: qty 1

## 2017-08-20 MED ORDER — HYDROCODONE-ACETAMINOPHEN 5-325 MG PO TABS
1.0000 | ORAL_TABLET | ORAL | Status: DC | PRN
  Administered 2017-08-21: 1 via ORAL
  Filled 2017-08-20: qty 1

## 2017-08-20 MED ORDER — SUGAMMADEX SODIUM 200 MG/2ML IV SOLN
INTRAVENOUS | Status: AC
  Filled 2017-08-20: qty 2

## 2017-08-20 MED ORDER — LACTATED RINGERS IR SOLN
Status: DC | PRN
  Administered 2017-08-20: 2000 mL

## 2017-08-20 MED ORDER — FENTANYL CITRATE (PF) 250 MCG/5ML IJ SOLN
INTRAMUSCULAR | Status: AC
  Filled 2017-08-20: qty 5

## 2017-08-20 MED ORDER — KCL IN DEXTROSE-NACL 20-5-0.45 MEQ/L-%-% IV SOLN
INTRAVENOUS | Status: DC
  Administered 2017-08-20: 14:00:00 via INTRAVENOUS
  Administered 2017-08-20 – 2017-08-21 (×2): 1000 mL via INTRAVENOUS
  Administered 2017-08-21 – 2017-08-24 (×4): via INTRAVENOUS
  Filled 2017-08-20 (×9): qty 1000

## 2017-08-20 MED ORDER — MIDAZOLAM HCL 2 MG/2ML IJ SOLN
INTRAMUSCULAR | Status: AC
  Filled 2017-08-20: qty 2

## 2017-08-20 MED ORDER — PROPOFOL 10 MG/ML IV BOLUS
INTRAVENOUS | Status: AC
  Filled 2017-08-20: qty 20

## 2017-08-20 MED ORDER — ONDANSETRON HCL 4 MG/2ML IJ SOLN: 4.0000 mg | Freq: Once | INTRAMUSCULAR | Status: DC | PRN

## 2017-08-20 MED ORDER — SUGAMMADEX SODIUM 200 MG/2ML IV SOLN
INTRAVENOUS | Status: DC | PRN
  Administered 2017-08-20: 200 mg via INTRAVENOUS

## 2017-08-20 MED ORDER — LIDOCAINE 2% (20 MG/ML) 5 ML SYRINGE
INTRAMUSCULAR | Status: DC | PRN
  Administered 2017-08-20: 1.5 mg/kg/h via INTRAVENOUS

## 2017-08-20 MED ORDER — FENTANYL CITRATE (PF) 100 MCG/2ML IJ SOLN: 25.0000 ug | INTRAMUSCULAR | Status: DC | PRN

## 2017-08-20 MED ORDER — BUPIVACAINE LIPOSOME 1.3 % IJ SUSP
20.0000 mL | Freq: Once | INTRAMUSCULAR | Status: AC
  Administered 2017-08-20: 20 mL
  Filled 2017-08-20: qty 20

## 2017-08-20 MED ORDER — SCOPOLAMINE 1 MG/3DAYS TD PT72
MEDICATED_PATCH | TRANSDERMAL | Status: AC
  Administered 2017-08-20: 1.5 mg
  Filled 2017-08-20: qty 1

## 2017-08-20 MED ORDER — PROPOFOL 10 MG/ML IV BOLUS
INTRAVENOUS | Status: DC | PRN
  Administered 2017-08-20: 130 mg via INTRAVENOUS

## 2017-08-20 MED ORDER — LIDOCAINE HCL (CARDIAC) 20 MG/ML IV SOLN
INTRAVENOUS | Status: DC | PRN
  Administered 2017-08-20: 60 mg via INTRAVENOUS
  Administered 2017-08-20: 25 mg via INTRATRACHEAL

## 2017-08-20 MED ORDER — BUPIVACAINE-EPINEPHRINE 0.25% -1:200000 IJ SOLN
INTRAMUSCULAR | Status: DC | PRN
  Administered 2017-08-20: 30 mL

## 2017-08-20 MED ORDER — LABETALOL HCL 5 MG/ML IV SOLN
INTRAVENOUS | Status: DC | PRN
  Administered 2017-08-20: 5 mg via INTRAVENOUS

## 2017-08-20 MED ORDER — MORPHINE SULFATE (PF) 2 MG/ML IV SOLN
1.0000 mg | INTRAVENOUS | Status: DC | PRN
  Administered 2017-08-20: 2 mg via INTRAVENOUS
  Filled 2017-08-20: qty 1

## 2017-08-20 MED ORDER — GABAPENTIN 300 MG PO CAPS
300.0000 mg | ORAL_CAPSULE | ORAL | Status: AC
  Administered 2017-08-20: 300 mg via ORAL
  Filled 2017-08-20: qty 1

## 2017-08-20 MED ORDER — ROCURONIUM BROMIDE 100 MG/10ML IV SOLN
INTRAVENOUS | Status: DC | PRN
  Administered 2017-08-20 (×2): 20 mg via INTRAVENOUS
  Administered 2017-08-20: 30 mg via INTRAVENOUS

## 2017-08-20 MED ORDER — ONDANSETRON HCL 4 MG PO TABS: 4.0000 mg | ORAL_TABLET | Freq: Four times a day (QID) | ORAL | Status: DC | PRN

## 2017-08-20 MED ORDER — ALVIMOPAN 12 MG PO CAPS
12.0000 mg | ORAL_CAPSULE | Freq: Once | ORAL | Status: AC
  Administered 2017-08-20: 12 mg via ORAL
  Filled 2017-08-20: qty 1

## 2017-08-20 MED ORDER — ONDANSETRON HCL 4 MG/2ML IJ SOLN: 4.0000 mg | Freq: Four times a day (QID) | INTRAMUSCULAR | Status: DC | PRN

## 2017-08-20 MED ORDER — DEXAMETHASONE SODIUM PHOSPHATE 10 MG/ML IJ SOLN
INTRAMUSCULAR | Status: DC | PRN
  Administered 2017-08-20: 8 mg via INTRAVENOUS

## 2017-08-20 MED ORDER — LIDOCAINE 2% (20 MG/ML) 5 ML SYRINGE
INTRAMUSCULAR | Status: AC
  Filled 2017-08-20: qty 10

## 2017-08-20 MED ORDER — ROCURONIUM BROMIDE 50 MG/5ML IV SOSY
PREFILLED_SYRINGE | INTRAVENOUS | Status: AC
  Filled 2017-08-20: qty 10

## 2017-08-20 MED ORDER — ONDANSETRON HCL 4 MG/2ML IJ SOLN
INTRAMUSCULAR | Status: AC
  Filled 2017-08-20: qty 2

## 2017-08-20 MED ORDER — LABETALOL HCL 5 MG/ML IV SOLN
INTRAVENOUS | Status: AC
  Filled 2017-08-20: qty 4

## 2017-08-20 MED ORDER — 0.9 % SODIUM CHLORIDE (POUR BTL) OPTIME
TOPICAL | Status: DC | PRN
  Administered 2017-08-20: 1000 mL

## 2017-08-20 MED ORDER — LIDOCAINE 2% (20 MG/ML) 5 ML SYRINGE
INTRAMUSCULAR | Status: AC
  Filled 2017-08-20: qty 5

## 2017-08-20 MED ORDER — MIDAZOLAM HCL 5 MG/5ML IJ SOLN
INTRAMUSCULAR | Status: DC | PRN
  Administered 2017-08-20 (×2): 0.5 mg via INTRAVENOUS

## 2017-08-20 MED ORDER — LACTATED RINGERS IV SOLN
INTRAVENOUS | Status: DC
  Administered 2017-08-20 (×3): via INTRAVENOUS

## 2017-08-20 SURGICAL SUPPLY — 71 items
APPLIER CLIP 5 13 M/L LIGAMAX5 (MISCELLANEOUS)
APPLIER CLIP ROT 10 11.4 M/L (STAPLE)
BARRIER SKIN 2 3/4 (OSTOMY) ×2 IMPLANT
BARRIER SKIN 2 3/4 INCH (OSTOMY) ×1
BLADE EXTENDED COATED 6.5IN (ELECTRODE) IMPLANT
CABLE HIGH FREQUENCY MONO STRZ (ELECTRODE) ×3 IMPLANT
CELLS DAT CNTRL 66122 CELL SVR (MISCELLANEOUS) IMPLANT
CLIP APPLIE 5 13 M/L LIGAMAX5 (MISCELLANEOUS) IMPLANT
CLIP APPLIE ROT 10 11.4 M/L (STAPLE) IMPLANT
COUNTER NEEDLE 20 DBL MAG RED (NEEDLE) ×3 IMPLANT
COVER MAYO STAND STRL (DRAPES) IMPLANT
COVER SURGICAL LIGHT HANDLE (MISCELLANEOUS) ×3 IMPLANT
DECANTER SPIKE VIAL GLASS SM (MISCELLANEOUS) ×3 IMPLANT
DERMABOND ADVANCED (GAUZE/BANDAGES/DRESSINGS) ×2
DERMABOND ADVANCED .7 DNX12 (GAUZE/BANDAGES/DRESSINGS) ×1 IMPLANT
DISSECTOR BLUNT TIP ENDO 5MM (MISCELLANEOUS) IMPLANT
DRAIN CHANNEL 19F RND (DRAIN) IMPLANT
DRAPE LAPAROSCOPIC ABDOMINAL (DRAPES) ×3 IMPLANT
DRAPE UTILITY XL STRL (DRAPES) ×3 IMPLANT
DRSG OPSITE POSTOP 4X10 (GAUZE/BANDAGES/DRESSINGS) IMPLANT
DRSG OPSITE POSTOP 4X6 (GAUZE/BANDAGES/DRESSINGS) IMPLANT
DRSG OPSITE POSTOP 4X8 (GAUZE/BANDAGES/DRESSINGS) IMPLANT
ELECT PENCIL ROCKER SW 15FT (MISCELLANEOUS) IMPLANT
ELECT REM PT RETURN 15FT ADLT (MISCELLANEOUS) ×3 IMPLANT
EVACUATOR SILICONE 100CC (DRAIN) IMPLANT
EXTRT SYSTEM ALEXIS 17CM (MISCELLANEOUS)
GAUZE SPONGE 4X4 12PLY STRL (GAUZE/BANDAGES/DRESSINGS) IMPLANT
GLOVE SURG SIGNA 7.5 PF LTX (GLOVE) ×6 IMPLANT
GOWN STRL REUS W/TWL XL LVL3 (GOWN DISPOSABLE) ×12 IMPLANT
HEMOSTAT SURGICEL 4X8 (HEMOSTASIS) ×3 IMPLANT
HOLDER FOLEY CATH W/STRAP (MISCELLANEOUS) ×3 IMPLANT
LEGGING LITHOTOMY PAIR STRL (DRAPES) IMPLANT
LUBRICANT JELLY K Y 4OZ (MISCELLANEOUS) IMPLANT
NEEDLE HYPO 25X1 1.5 SAFETY (NEEDLE) ×3 IMPLANT
PACK COLON (CUSTOM PROCEDURE TRAY) ×3 IMPLANT
PAD POSITIONING PINK XL (MISCELLANEOUS) ×3 IMPLANT
POSITIONER SURGICAL ARM (MISCELLANEOUS) IMPLANT
POUCH OSTOMY 2 3/4  H 3804 (WOUND CARE) ×2
POUCH OSTOMY 2 PC DRNBL 2.75 (WOUND CARE) ×1 IMPLANT
RTRCTR WOUND ALEXIS 18CM MED (MISCELLANEOUS)
SEALER TISSUE X1 CVD JAW (INSTRUMENTS) IMPLANT
SET IRRIG TUBING LAPAROSCOPIC (IRRIGATION / IRRIGATOR) ×3 IMPLANT
SHEARS CURVED HARMONIC AC 45CM (MISCELLANEOUS) ×3 IMPLANT
SHEARS HARMONIC ACE PLUS 36CM (ENDOMECHANICALS) IMPLANT
SLEEVE ADV FIXATION 5X100MM (TROCAR) ×12 IMPLANT
STAPLER VISISTAT 35W (STAPLE) ×3 IMPLANT
SUT ETHILON 3 0 PS 1 (SUTURE) IMPLANT
SUT MNCRL AB 4-0 PS2 18 (SUTURE) ×9 IMPLANT
SUT NOVA NAB DX-16 0-1 5-0 T12 (SUTURE) IMPLANT
SUT PDS AB 1 CTX 36 (SUTURE) IMPLANT
SUT PDS AB 1 TP1 96 (SUTURE) IMPLANT
SUT PROLENE 2 0 KS (SUTURE) IMPLANT
SUT PROLENE 2 0 SH DA (SUTURE) IMPLANT
SUT SILK 2 0 (SUTURE) ×2
SUT SILK 2 0 SH CR/8 (SUTURE) ×3 IMPLANT
SUT SILK 2-0 18XBRD TIE 12 (SUTURE) ×1 IMPLANT
SUT SILK 3 0 (SUTURE) ×2
SUT SILK 3 0 SH CR/8 (SUTURE) ×3 IMPLANT
SUT SILK 3-0 18XBRD TIE 12 (SUTURE) ×1 IMPLANT
SUT VIC AB 3-0 SH 18 (SUTURE) ×3 IMPLANT
SYS LAPSCP GELPORT 120MM (MISCELLANEOUS)
SYSTEM CONTND EXTRCTN KII BLLN (MISCELLANEOUS) IMPLANT
SYSTEM LAPSCP GELPORT 120MM (MISCELLANEOUS) IMPLANT
TOWEL OR NON WOVEN STRL DISP B (DISPOSABLE) IMPLANT
TRAY FOLEY W/METER SILVER 16FR (SET/KITS/TRAYS/PACK) ×3 IMPLANT
TROCAR ADV FIXATION 11X100MM (TROCAR) IMPLANT
TROCAR ADV FIXATION 12X100MM (TROCAR) IMPLANT
TROCAR ADV FIXATION 5X100MM (TROCAR) ×6 IMPLANT
TROCAR BLADELESS OPT 5 100 (ENDOMECHANICALS) ×3 IMPLANT
TROCAR XCEL BLUNT TIP 100MML (ENDOMECHANICALS) IMPLANT
TUBING INSUF HEATED (TUBING) ×3 IMPLANT

## 2017-08-20 NOTE — Anesthesia Postprocedure Evaluation (Signed)
Anesthesia Post Note  Patient: Austin [name]  Procedure(s) Performed: LAPAROSCOPIC ASSISTED REVISION END COLOSTOMY AND ENTEROLYSIS OF ADHESIONS (N/A Abdomen)     Patient location during evaluation: PACU Anesthesia Type: General Level of consciousness: awake and alert Pain management: pain level controlled Vital Signs Assessment: post-procedure vital signs reviewed and stable Respiratory status: spontaneous breathing, nonlabored ventilation, respiratory function stable and patient connected to nasal cannula oxygen Cardiovascular status: blood pressure returned to baseline and stable Postop Assessment: no apparent nausea or vomiting Anesthetic complications: no    Last Vitals:  Vitals:   08/20/17 1311 08/20/17 1329  BP: 140/77   Pulse: 72 70  Resp: 14 12  Temp: 36.8 C 36.5 C  SpO2: 98% 97%    Last Pain:  Vitals:   08/20/17 0711  TempSrc: Oral                 Tiajuana Amass

## 2017-08-20 NOTE — Anesthesia Preprocedure Evaluation (Signed)
Anesthesia Evaluation  Patient identified by MRN, date of birth, ID band Patient awake    Reviewed: Allergy & Precautions, NPO status , Patient's Chart, lab work & pertinent test results  Airway Mallampati: II  TM Distance: >3 FB Neck ROM: Full    Dental  (+) Edentulous Upper, Edentulous Lower   Pulmonary former smoker,    breath sounds clear to auscultation       Cardiovascular hypertension, Pt. on medications  Rhythm:Regular Rate:Normal     Neuro/Psych  Neuromuscular disease CVA (brainstem stroke. qradriplegic), Residual Symptoms    GI/Hepatic negative GI ROS, Neg liver ROS, Colonic diverticular fistula    Endo/Other  diabetes  Renal/GU Renal disease     Musculoskeletal   Abdominal   Peds  Hematology  (+) anemia ,   Anesthesia Other Findings   Reproductive/Obstetrics                             Lab Results  Component Value Date   WBC 8.0 08/14/2017   HGB 10.6 (L) 08/14/2017   HCT 35.3 (L) 08/14/2017   MCV 84.7 08/14/2017   PLT 230 08/14/2017   Lab Results  Component Value Date   CREATININE 1.23 08/14/2017   BUN 19 08/14/2017   NA 139 08/14/2017   K 4.5 08/14/2017   CL 107 08/14/2017   CO2 26 08/14/2017    Anesthesia Physical  Anesthesia Plan  ASA: IV  Anesthesia Plan: General   Post-op Pain Management:    Induction: Intravenous  PONV Risk Score and Plan: 4 or greater and Ondansetron, Dexamethasone, Scopolamine patch - Pre-op and Treatment may vary due to age or medical condition  Airway Management Planned: Oral ETT  Additional Equipment:   Intra-op Plan:   Post-operative Plan: Extubation in OR  Informed Consent: I have reviewed the patients History and Physical, chart, labs and discussed the procedure including the risks, benefits and alternatives for the proposed anesthesia with the patient or authorized representative who has indicated his/her understanding  and acceptance.   Dental advisory given  Plan Discussed with: CRNA  Anesthesia Plan Comments:         Anesthesia Quick Evaluation

## 2017-08-20 NOTE — Anesthesia Procedure Notes (Signed)
Procedure Name: Intubation Date/Time: 08/20/2017 9:39 AM Performed by: Lissa Morales, CRNA Pre-anesthesia Checklist: Patient identified, Emergency Drugs available, Suction available and Patient being monitored Patient Re-evaluated:Patient Re-evaluated prior to induction Oxygen Delivery Method: Circle system utilized Preoxygenation: Pre-oxygenation with 100% oxygen Induction Type: IV induction Ventilation: Mask ventilation with difficulty Laryngoscope Size: 4 and Mac Grade View: Grade I Tube type: Oral Tube size: 8.0 mm Number of attempts: 1 Airway Equipment and Method: Stylet and Oral airway Placement Confirmation: ETT inserted through vocal cords under direct vision,  positive ETCO2 and breath sounds checked- equal and bilateral Secured at: 22 cm Tube secured with: Tape Dental Injury: Teeth and Oropharynx as per pre-operative assessment

## 2017-08-20 NOTE — Transfer of Care (Signed)
Immediate Anesthesia Transfer of Care Note  Patient: Austin [name]  Procedure(s) Performed: LAPAROSCOPIC ASSISTED REVISION END COLOSTOMY AND ENTEROLYSIS OF ADHESIONS (N/A Abdomen)  Patient Location: PACU  Anesthesia Type:General  Level of Consciousness: awake, alert , oriented and patient cooperative  Airway & Oxygen Therapy: Patient Spontanous Breathing and Patient connected to face mask oxygen  Post-op Assessment: Report given to RN and Post -op Vital signs reviewed and stable  Post vital signs: stable  Last Vitals:  Vitals:   08/20/17 0711 08/20/17 1243  BP: (!) 141/82 (!) 144/80  Pulse: 82 72  Resp: 18 11  Temp: 37 C 36.8 C  SpO2: 98% 99%    Last Pain:  Vitals:   08/20/17 0711  TempSrc: Oral      Patients Stated Pain Goal: 4 (44/01/02 7253)  Complications: No apparent anesthesia complications

## 2017-08-20 NOTE — Interval H&P Note (Signed)
History and Physical Interval Note:  08/20/2017 8:22 AM  Austin [name]  has presented today for surgery, with the diagnosis of stricture of colostomy  The various methods of treatment have been discussed with the patient and family.  Wife at bedside.  After consideration of risks, benefits and other options for treatment, the patient has consented to  Procedure(s): [name] (N/A) as a surgical intervention .  The patient's history has been reviewed, patient examined, no change in status, stable for surgery.  I have reviewed the patient's chart and labs.  Questions were answered to the patient's satisfaction.     Shann Medal

## 2017-08-20 NOTE — Op Note (Signed)
08/20/2017  12:27 PM  PATIENT:  Austin [name], 62 y.o., male, MRN: [id number]  PREOP DIAGNOSIS:  stricture of colostomy  POSTOP DIAGNOSIS:   Stricture of colostomy, adhesions around colostomy opening.  PROCEDURE:   Procedure(s): LAPAROSCOPIC ASSISTED REVISION END COLOSTOMY AND ENTEROLYSIS OF ADHESIONS (1 hour for enterolysis of adhesions)  SURGEON:   Alphonsa Overall, M.D.  ASSISTANTClyda Greener, M.D.  ANESTHESIA:   general  Anesthesiologist: Suzette Battiest, MD CRNA: Lollie Sails, CRNA; Lissa Morales, CRNA  General  EBL:  100  ml  BLOOD ADMINISTERED: none  DRAINS: none   LOCAL MEDICATIONS USED:   20 cc of exparel and 30 cc of 1/4% marcaine  SPECIMEN:   Colostomy  COUNTS CORRECT:  YES  INDICATIONS FOR PROCEDURE:  Austin [name] is a 62 y.o. (DOB: [birth year]) white male whose primary care physician is [name], MD and comes for revision of end colostomy.   The indications and risks of the surgery were explained to the patient.  The risks include, but are not limited to, infection, bleeding, and nerve injury.  PROCEDURE:  The patient was taken to room #[facility] at [facility].  He had both arms tucked, and OG tube passed, and a Foley catheter passed.  He was given a general anesthetic.  He was given 2 g of cefotetan with the initiation of the operation.   A timeout was held the surgical checklist run.   Access to the abdominal cavity with a 5 mm Ethicon Optiview trocar in the right upper quadrant.  I placed 4 additional trochars.  I placed a 5 mm trocar in the right lower quadrant, I placed a 5 mm trocar in the lower midline, I placed a 5 mm trocar in the left lower quadrant, and I placed a 5 mm trocar in the left lateral abdomen.   Abdominal expiration carried out revealed a liver which was fairly unremarkable from what I could see.  He had several tumor nodules that could be seen on the peritoneal surface.  But he has known metastatic renal cell  carcinoma, so these were left alone.  Most remarkable is he had dense adhesions of small bowel around his distal colostomy near the anterior abdominal wall.  I spent about 1 hour doing enteral lysis of adhesions taking down the small bowel adhesions from the distal colon.  I then ran the small bowel to make sure there is no obvious small bowel injury.   I then identified where the mesentery of the small bowel was stuck up on the undersurface of the colostomy.  This was all part of a fairly intense inflammatory reaction around the ostomy.   I then mobilized the left colon.  He had a prior left nephrectomy, so there were extensive adhesions along the left colon gutter and renal bed that made mobilizing the colon difficult.  Once I released these adhesions and got to the splenic flexure, I had enough mobility of the colon that I thought I create a new colostomy.  There was some bleeding from the old kidney bed, but this was controlled with Surgicel.    I then cut down around the colostomy itself and got into the peritoneal cavity.  I resected the distal 12 cm of colostomy and colon back to healthy looking bowel.  This is what I used for the new colostomy.   I re-laparoscope the patient after this dissection make sure there is no bleeding.   The trochars were then  removed in turn.  I matured the colostomy with interrupted 3-0 Vicryl sutures, some sewn to the fascia and the rest sewn circumferentially around the ostomy skin.   I closed the trocar sites with 4-0 Monocryl suture painted this with Dermabond.  The patient tolerated the procedure well and was transported to recovery room in good condition.  Sponge and needle count were correct at the end of the case.  Alphonsa Overall, MD, [facility] Surgery Pager: [phone] Office phone:  [phone]

## 2017-08-21 ENCOUNTER — Encounter (HOSPITAL_COMMUNITY): Payer: Self-pay | Admitting: Surgery

## 2017-08-21 LAB — CBC
HCT: 30.5 % — ABNORMAL LOW (ref 39.0–52.0)
HEMOGLOBIN: 9.6 g/dL — AB (ref 13.0–17.0)
MCH: 26.6 pg (ref 26.0–34.0)
MCHC: 31.5 g/dL (ref 30.0–36.0)
MCV: 84.5 fL (ref 78.0–100.0)
PLATELETS: 206 10*3/uL (ref 150–400)
RBC: 3.61 MIL/uL — ABNORMAL LOW (ref 4.22–5.81)
RDW: 24 % — ABNORMAL HIGH (ref 11.5–15.5)
WBC: 11.8 10*3/uL — ABNORMAL HIGH (ref 4.0–10.5)

## 2017-08-21 LAB — BASIC METABOLIC PANEL
ANION GAP: 5 (ref 5–15)
BUN: 16 mg/dL (ref 6–20)
CALCIUM: 7.7 mg/dL — AB (ref 8.9–10.3)
CO2: 22 mmol/L (ref 22–32)
CREATININE: 1.18 mg/dL (ref 0.61–1.24)
Chloride: 105 mmol/L (ref 101–111)
GLUCOSE: 253 mg/dL — AB (ref 65–99)
Potassium: 5.1 mmol/L (ref 3.5–5.1)
Sodium: 132 mmol/L — ABNORMAL LOW (ref 135–145)

## 2017-08-21 MED ORDER — ESCITALOPRAM OXALATE 10 MG PO TABS
10.0000 mg | ORAL_TABLET | Freq: Every day | ORAL | Status: DC
  Administered 2017-08-21 – 2017-08-24 (×4): 10 mg via ORAL
  Filled 2017-08-21 (×4): qty 1

## 2017-08-21 MED ORDER — TRAZODONE HCL 50 MG PO TABS
50.0000 mg | ORAL_TABLET | Freq: Every day | ORAL | Status: DC
  Administered 2017-08-21 – 2017-08-23 (×3): 50 mg via ORAL
  Filled 2017-08-21 (×3): qty 1

## 2017-08-21 MED ORDER — PANTOPRAZOLE SODIUM 40 MG PO TBEC
40.0000 mg | DELAYED_RELEASE_TABLET | Freq: Two times a day (BID) | ORAL | Status: DC
  Administered 2017-08-21 – 2017-08-24 (×7): 40 mg via ORAL
  Filled 2017-08-21 (×7): qty 1

## 2017-08-21 NOTE — Consult Note (Addendum)
   [facility] Inpatient Consult   08/21/2017  FRENCHIE DANGERFIELD Dec 25, 1955 [id number]    Made aware of scheduled hospital admission by [place].   Mr. Hogeland is active with [place] Management program.   Mr. Alcock and wife have only been agreeable to [place] calls rather than home visits thus far.  Spoke to inpatient RNCM to make aware [place] Management is active.  Will continue to follow hospital course and update [place].   Marthenia Rolling, MSN-Ed, RN,BSN [facility] Liaison [phone]

## 2017-08-21 NOTE — Consult Note (Signed)
[place] Nurse ostomy consult note Stoma type/location: LLQ, end colostomy.  New stoma placed at site of previous stenosed stoma Stomal assessment/size: 1 7/8" budded, pink, moist Peristomal assessment: intact  Treatment options for stomal/peristomal skin: none Output bloody Ostomy pouching: 2pc. 2 3/4" used today. Discussed with patient and his wife that stoma is just over 1 3/4" which is what they had been using at home.  She will try the wafers at home once any edema has resolved.  Right now it's too snug.  I have provided the patient with 3 2/4" skin barriers and pouches.  They had been using barrier ring prior to this admission for flush/retracted stoma. With the current budded stoma we chose not to use today and the patient and wife verbalized understanding for not using.  If they have any issues at home they can restart use, explained how to use a portion of the barrier ring if needed if he begins to have any particular points that are leaking or causing issues.  Education provided: see above  Performed pouch change today because he may DC soon and this stoma is very different than the stoma the patient and wife were pouching previously. They were very happy to go through the steps and understand the care very well. We briefly discussed diet and the use of his Miralax regimen that he was using prior to this admission.  I have asked them to discuss this with Dr. Lucia Gaskins prior to DC.   Enrolled patient in [place] program: Yes. Previously enrolled, requested new supply samples to be sent to the home. 2 3/4" 2pc with filter for now until his stoma begins to be less edematous.   [place] Nurse team will follow along with you for continued support with ostomy teaching and care Tril[place] MSN, RN, [place], [place], [place]

## 2017-08-21 NOTE — Progress Notes (Signed)
[place] Surgery Office:  [phone] General Surgery Progress Note   LOS: 1 day  POD -  1 Day Post-Op  Chief Complaint: Stricture of end colostomy  Assessment and Plan: 1.  LAPAROSCOPIC ASSISTED REVISION END COLOSTOMY AND ENTEROLYSIS OF ADHESIONS - 08/20/2017 - D. Aryanah Enslow  For stricture of end sigmoid colostomy  Has had some stuff out of colostomy   Will start clear liquids and advance   2.  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 Tresa Moore     Peritoneal metastasis 2015 Followed by Dr. Jerilynn Mages 3.  ANTICOAGULATED (Z79.01)             Impression: His off the Plavix now 4.  QUADRIPLEGIA (G82.50)             Story: brain stem stroke in 1993             Wheelchair bound - his wife takes good care of him  5. Pituitary tumor in 1994 6.  DVT prophylaxis - SQ Heparin   Active Problems:   Colostomy stricture (HCC)   Subjective:  Doing well early post op  Wife and daughter at bedside.  Objective:   Vitals:   08/21/17 0206 08/21/17 0627  BP: 130/68 138/70  Pulse: 72 71  Resp: 16 16  Temp: 98.5 F (36.9 C) 98.3 F (36.8 C)  SpO2: 98% 98%     Intake/Output from previous day:  01/10 0701 - 01/11 0700 In: 4599.2 [P.O.:120; I.V.:4479.2] Out: 2440 [Urine:2340; Blood:100]  Intake/Output this shift:  No intake/output data recorded.   Physical Exam:   General: Older WM, difficult to understand because of his prior stroke, but is alert and oriented.    HEENT: Normal. Pupils equal. .   Lungs: Clear   Abdomen: Soft -  Has rare BS   Wound: Clean      Lab Results:    Recent Labs    08/21/17 0519  WBC 11.8*  HGB 9.6*  HCT 30.5*  PLT 206    BMET   Recent Labs    08/21/17 0519  NA 132*  K 5.1  CL 105  CO2 22  GLUCOSE 253*  BUN 16  CREATININE 1.18  CALCIUM 7.7*    PT/INR  No results for input(s): LABPROT, INR in the last 72 hours.  ABG  No results  for input(s): PHART, HCO3 in the last 72 hours.  Invalid input(s): PCO2, PO2   Studies/Results:  No results found.   Anti-infectives:   Anti-infectives (From admission, onward)   Start     Dose/Rate Route Frequency Ordered Stop   08/20/17 0701  cefoTEtan in Dextrose 5% (CEFOTAN) IVPB 2 g     2 g Intravenous On call to O.R. 08/20/17 0701 08/20/17 1000      Alphonsa Overall, MD, FACS Pager: [place] [facility] Office: [phone] 08/21/2017

## 2017-08-22 NOTE — Progress Notes (Signed)
General Surgery [facility]  Assessment & Plan: 1.  LAP ASSISTED REVISION END COLOSTOMY AND LYSIS OF ADHESIONS - 08/20/2017 - D. Newman             For stricture of end sigmoid colostomy             Colostomy output increasing              Advance to full liquid diet today 2.METASTATIC RENAL CELL CARCINOMA TO INTRA-ABDOMINAL SITE (C79.89) s/p left radical nephrectomy 04/23/2012 - Dr. Tresa Moore Peritoneal metastasis 2015 Followed by Dr. 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 Pituitary tumor in 1994 6.  DVT prophylaxis - SQ Heparin  Active Problems:   Colostomy stricture (HCC)        [name], MD, [facility]       Office: [phone]    Chief Complaint: Stricture of colostomy  Subjective: Patient in bed, comfortable.  Wife at bedside.  Wants to eat - "hungry".  Objective: Vital signs in last 24 hours: Temp:  [97.8 F (36.6 C)-98.6 F (37 C)] 97.8 F (36.6 C) (01/12 0621) Pulse Rate:  [65-70] 67 (01/12 0621) Resp:  [16-18] 16 (01/12 0621) BP: (136-152)/(70-82) 142/70 (01/12 0621) SpO2:  [96 %-100 %] 96 % (01/12 0621)    Intake/Output from previous day: 01/11 0701 - 01/12 0700 In: 1380 [P.O.:180; I.V.:1200] Out: 3175 [Urine:2900; Stool:275] Intake/Output this shift: No intake/output data recorded.  Physical Exam: HEENT - sclerae clear, mucous membranes moist Neck - soft Abdomen - soft, mild distension; wounds dry and intact; ostomy viable, soft stool in bag - moderate   Lab Results:  Recent Labs    08/21/17 0519  WBC 11.8*  HGB 9.6*  HCT 30.5*  PLT 206   BMET Recent Labs    08/21/17 0519  NA 132*  K 5.1  CL 105  CO2 22  GLUCOSE 253*  BUN 16  CREATININE 1.18  CALCIUM 7.7*    PT/INR No results for input(s): LABPROT, INR in the last 72 hours. Comprehensive Metabolic Panel:    Component Value Date/Time   NA 132 (L) 08/21/2017 0519   NA 139 08/14/2017 1146   NA 138 03/13/2017 0922   NA 135 (L) 11/11/2016 0913   K 5.1 08/21/2017 0519   K 4.5 08/14/2017 1146   K 4.6 03/13/2017 0922   K 4.6 11/11/2016 0913   CL 105 08/21/2017 0519   CL 107 08/14/2017 1146   CO2 22 08/21/2017 0519   CO2 26 08/14/2017 1146   CO2 24 03/13/2017 0922   CO2 17 (L) 11/11/2016 0913   BUN 16 08/21/2017 0519   BUN 19 08/14/2017 1146   BUN 27.4 (H) 03/13/2017 0922   BUN 42.8 (H) 11/11/2016 0913   CREATININE 1.18 08/21/2017 0519   CREATININE 1.23 08/14/2017 1146   CREATININE 0.87 06/05/2017 1643   CREATININE 1.3 03/13/2017 0922   CREATININE 1.32 (H) 02/06/2017 1605   CREATININE 1.5 (H) 11/11/2016 0913   GLUCOSE 253 (H) 08/21/2017 0519   GLUCOSE 99 08/14/2017 1146   GLUCOSE 91 03/13/2017 0922   GLUCOSE 105 11/11/2016 0913   CALCIUM 7.7 (L) 08/21/2017 0519   CALCIUM 8.8 (L) 08/14/2017 1146   CALCIUM 9.3 03/13/2017 0922   CALCIUM 9.4 11/11/2016 0913   AST 25 06/15/2017 1414  AST 16 05/28/2017 0759   AST 17 03/13/2017 0922   AST 14 11/11/2016 0913   ALT 16 (L) 06/15/2017 1414   ALT 31 05/28/2017 0759   ALT 21 03/13/2017 0922   ALT 18 11/11/2016 0913   ALKPHOS 67 06/15/2017 1414   ALKPHOS 70 05/28/2017 0759   ALKPHOS 91 03/13/2017 0922   ALKPHOS 63 11/11/2016 0913   BILITOT 0.9 06/15/2017 1414   BILITOT 0.5 05/28/2017 0759   BILITOT 0.27 03/13/2017 0922   BILITOT <0.22 11/11/2016 0913   PROT 7.2 06/15/2017 1414   PROT 7.1 05/28/2017 0759   PROT 7.9 03/13/2017 0922   PROT 7.7 11/11/2016 0913   ALBUMIN 2.6 (L) 06/15/2017 1414   ALBUMIN 2.9 (L) 05/28/2017 0759   ALBUMIN 3.0 (L) 03/13/2017 0922   ALBUMIN 3.1 (L) 11/11/2016 0913    Studies/Results: No results found.    Faelynn Wynder M 08/22/2017  Patient ID: Talbert Cage, male   DOB: 01/05/1956, 62 y.o.    MRN: [id number]

## 2017-08-23 NOTE — Progress Notes (Signed)
General Surgery [facility]  Assessment & Plan: 1.LAP ASSISTED REVISION END COLOSTOMY AND LYSIS OF ADHESIONS- 08/20/2017 - D. 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 Tresa Moore Peritoneal metastasis 2015  Followed by Dr. 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 Pituitary tumor in 1994 6.DVT prophylaxis -SQ Heparin  Active Problems: Colostomy stricture (HCC)        Austin Regal, MD, [facility]       Office: [phone]    Chief Complaint: Colostomy stricture  Subjective: Patient in bed, comfortable.  Tolerated full liquids.  No pain from ostomy.  Objective: Vital signs in last 24 hours: Temp:  [98.2 F (36.8 C)-98.7 F (37.1 C)] 98.3 F (36.8 C) (01/13 0626) Pulse Rate:  [70-72] 71 (01/13 0626) Resp:  [18] 18 (01/13 0626) BP: (148-157)/(72-84) 151/84 (01/13 0626) SpO2:  [100 %] 100 % (01/13 0626)    Intake/Output from previous day: 01/12 0701 - 01/13 0700 In: 2125.8 [P.O.:540; I.V.:1585.8] Out: 3685 [Urine:3625; Stool:60] Intake/Output this shift: No intake/output data recorded.  Physical Exam: HEENT - sclerae clear, mucous membranes moist Neck - soft, healed trach site Chest - clear bilaterally Cor - RRR Abdomen - ostomy viable LUQ; small gas and liquid stool in bag  Lab Results:  Recent Labs    08/21/17 0519  WBC 11.8*  HGB 9.6*  HCT 30.5*  PLT 206   BMET Recent Labs    08/21/17 0519  NA 132*  K 5.1  CL 105  CO2 22   GLUCOSE 253*  BUN 16  CREATININE 1.18  CALCIUM 7.7*   PT/INR No results for input(s): LABPROT, INR in the last 72 hours. Comprehensive Metabolic Panel:    Component Value Date/Time   NA 132 (L) 08/21/2017 0519   NA 139 08/14/2017 1146   NA 138 03/13/2017 0922   NA 135 (L) 11/11/2016 0913   K 5.1 08/21/2017 0519   K 4.5 08/14/2017 1146   K 4.6 03/13/2017 0922   K 4.6 11/11/2016 0913   CL 105 08/21/2017 0519   CL 107 08/14/2017 1146   CO2 22 08/21/2017 0519   CO2 26 08/14/2017 1146   CO2 24 03/13/2017 0922   CO2 17 (L) 11/11/2016 0913   BUN 16 08/21/2017 0519   BUN 19 08/14/2017 1146   BUN 27.4 (H) 03/13/2017 0922   BUN 42.8 (H) 11/11/2016 0913   CREATININE 1.18 08/21/2017 0519   CREATININE 1.23 08/14/2017 1146   CREATININE 0.87 06/05/2017 1643   CREATININE 1.3 03/13/2017 0922   CREATININE 1.32 (H) 02/06/2017 1605   CREATININE 1.5 (H) 11/11/2016 0913   GLUCOSE 253 (H) 08/21/2017 0519   GLUCOSE 99 08/14/2017 1146   GLUCOSE 91 03/13/2017 0922   GLUCOSE 105 11/11/2016 0913   CALCIUM 7.7 (L) 08/21/2017 0519   CALCIUM 8.8 (L) 08/14/2017 1146   CALCIUM 9.3 03/13/2017 0922   CALCIUM 9.4 11/11/2016 0913   AST 25 06/15/2017 1414   AST 16 05/28/2017 0759   AST 17 03/13/2017 0922   AST 14 11/11/2016 0913   ALT 16 (L) 06/15/2017 1414   ALT 31 05/28/2017 0759   ALT 21 03/13/2017  0922   ALT 18 11/11/2016 0913   ALKPHOS 67 06/15/2017 1414   ALKPHOS 70 05/28/2017 0759   ALKPHOS 91 03/13/2017 0922   ALKPHOS 63 11/11/2016 0913   BILITOT 0.9 06/15/2017 1414   BILITOT 0.5 05/28/2017 0759   BILITOT 0.27 03/13/2017 0922   BILITOT <0.22 11/11/2016 0913   PROT 7.2 06/15/2017 1414   PROT 7.1 05/28/2017 0759   PROT 7.9 03/13/2017 0922   PROT 7.7 11/11/2016 0913   ALBUMIN 2.6 (L) 06/15/2017 1414   ALBUMIN 2.9 (L) 05/28/2017 0759   ALBUMIN 3.0 (L) 03/13/2017 0922   ALBUMIN 3.1 (L) 11/11/2016 0913    Studies/Results: No results found.    Austin [name] M 08/23/2017  Patient  ID: Austin [name], male   DOB: 1955/10/12, 62 y.o.   MRN: [id number]

## 2017-08-24 LAB — BASIC METABOLIC PANEL
Anion gap: 4 — ABNORMAL LOW (ref 5–15)
BUN: 9 mg/dL (ref 6–20)
CHLORIDE: 109 mmol/L (ref 101–111)
CO2: 24 mmol/L (ref 22–32)
CREATININE: 0.98 mg/dL (ref 0.61–1.24)
Calcium: 8.2 mg/dL — ABNORMAL LOW (ref 8.9–10.3)
GFR calc non Af Amer: >60 mL/min (ref >60)
Glucose, Bld: 102 mg/dL — ABNORMAL HIGH (ref 65–99)
POTASSIUM: 4.2 mmol/L (ref 3.5–5.1)
Sodium: 137 mmol/L (ref 135–145)

## 2017-08-24 NOTE — Progress Notes (Signed)
Pt ready for discharge home with wife. Reviewed discharge instructions, prescriptions and f/u appointments.

## 2017-08-24 NOTE — Discharge Summary (Signed)
Physician Discharge Summary  Patient ID:  Austin [name]  MRN: [id number]  DOB/AGE: Aug 17, 1955 62 y.o.  Admit date: 08/20/2017 Discharge date: 08/24/2017  Discharge Diagnoses:  1.  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 Tresa Moore Peritoneal metastasis 2015 Followed by Dr. 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 Pituitary tumor in 1994   Active Problems:   Colostomy stricture ([place])  Operation: Procedure(s): LAPAROSCOPIC ASSISTED REVISION END COLOSTOMY AND ENTEROLYSIS OF ADHESIONS on 08/20/2017 - D. Lucia Gaskins  Discharged Condition: good  Hospital Course: KANAN SOBEK is an 62 y.o. male whose primary care physician is [name], MD and who was admitted 08/20/2017 with a chief complaint of colostomy stricture.   He was brought to the operating room on 08/20/2017 and underwent enterolysis of adhesions and revision of the end colostomy.   He is now 4 days post op.  He is eating well, afebrile, and his colostomy is working well. The colostomy is doing much better this time around. His wife is at the bedside.  They plan to hold the Plavix until they see me back in the office.  The discharge instructions were reviewed with the patient.  Consults: None  Significant Diagnostic Studies: Results for orders placed or performed during the hospital encounter of 16/94/50  Basic metabolic panel  Result Value Ref Range   Sodium 132 (L) 135 - 145 mmol/L   Potassium 5.1 3.5 - 5.1 mmol/L   Chloride 105 101 - 111 mmol/L   CO2 22 22 - 32 mmol/L   Glucose, Bld 253 (H) 65 - 99 mg/dL   BUN 16 6 - 20 mg/dL   Creatinine, Ser 1.18 0.61 - 1.24 mg/dL   Calcium 7.7 (L) 8.9 - 10.3 mg/dL    GFR calc non Af Amer >60 >60 mL/min   GFR calc Af Amer >60 >60 mL/min   Anion gap 5 5 - 15  CBC  Result Value Ref Range   WBC 11.8 (H) 4.0 - 10.5 K/uL   RBC 3.61 (L) 4.22 - 5.81 MIL/uL   Hemoglobin 9.6 (L) 13.0 - 17.0 g/dL   HCT 30.5 (L) 39.0 - 52.0 %   MCV 84.5 78.0 - 100.0 fL   MCH 26.6 26.0 - 34.0 pg   MCHC 31.5 30.0 - 36.0 g/dL   RDW 24.0 (H) 11.5 - 15.5 %   Platelets 206 150 - 400 K/uL  Basic metabolic panel  Result Value Ref Range   Sodium 137 135 - 145 mmol/L   Potassium 4.2 3.5 - 5.1 mmol/L   Chloride 109 101 - 111 mmol/L   CO2 24 22 - 32 mmol/L   Glucose, Bld 102 (H) 65 - 99 mg/dL   BUN 9 6 - 20 mg/dL   Creatinine, Ser 0.98 0.61 - 1.24 mg/dL   Calcium 8.2 (L) 8.9 - 10.3 mg/dL   GFR calc non Af Amer >60 >60 mL/min   GFR calc Af Amer >60 >60 mL/min   Anion gap 4 (L) 5 - 15  Type and screen [place]  Result Value Ref Range   ABO/RH(D) O POS    Antibody Screen NEG    Sample Expiration 08/23/2017     No results found.  Discharge Exam:  Vitals:   08/23/17 2210 08/24/17 0616  BP: (!) 145/65 (!) 142/64  Pulse: 79 78  Resp: 16 17  Temp: 98.1 F (36.7 C) 98.6 F (37 C)  SpO2: 93% 93%    General: WN older WM who is alert.   He is wheel chair bound.  Lungs: Clear to auscultation and symmetric breath sounds. Heart:  RRR. No murmur or rub. Abdomen: Soft. No mass.  Normal bowel sounds.  Colostomy is working well.  Discharge Medications:   Allergies as of 08/24/2017      Reactions   Penicillins Rash, Other (See Comments)   Has patient had a PCN reaction causing immediate rash, facial/tongue/throat swelling, SOB or lightheadedness with hypotension: YES Has patient had a PCN reaction causing severe rash involving mucus membranes or skin necrosis: No Has patient had a PCN reaction that required hospitalization No Has patient had a PCN reaction occurring within the last 10 years: Yes  If all of the above answers are "NO", then may proceed with  Cephalosporin use.      Medication List    STOP taking these medications   clopidogrel 75 MG tablet Commonly known as:  PLAVIX     TAKE these medications   acetaminophen 500 MG tablet Commonly known as:  TYLENOL Take 1 tablet (500 mg total) by mouth every 6 (six) hours as needed for mild pain, moderate pain, fever or headache.   albuterol 108 (90 Base) MCG/ACT inhaler Commonly known as:  PROVENTIL HFA;VENTOLIN HFA Inhale 2 puffs into the lungs every 6 (six) hours as needed for wheezing or shortness of breath.   atorvastatin 20 MG tablet Commonly known as:  LIPITOR Take 20 mg by mouth daily with breakfast.   cetirizine 10 MG tablet Commonly known as:  ZYRTEC Take 10 mg by mouth daily with breakfast.   escitalopram 10 MG tablet Commonly known as:  LEXAPRO TAKE ONE TABLET BY MOUTH ONCE DAILY IN THE MORNING What changed:  See the new instructions.   furosemide 20 MG tablet Commonly known as:  LASIX Take 1 tablet (20 mg total) by mouth daily. What changed:    when to take this  reasons to take this   ketoconazole 2 % cream Commonly known as:  NIZORAL APPLY AS NEEDED TO RASH What changed:  See the new instructions.   Melatonin 3 MG Tabs Take 3 mg by mouth at bedtime as needed (for sleep).   multivitamin with minerals Tabs tablet Take 1 tablet by mouth daily with breakfast.   pantoprazole 40 MG tablet Commonly known as:  PROTONIX Take 1 tablet (40 mg total) by mouth 2 (two) times daily.   saccharomyces boulardii 250 MG capsule Commonly known as:  FLORASTOR Take 1 capsule (250 mg total) by mouth 2 (two) times daily.   testosterone enanthate 200 MG/ML injection Commonly known as:  DELATESTRYL INJECT 1ML INTO THE MUSCLE EVERY 2 WEEKS What changed:  See the new instructions.   traMADol 50 MG tablet Commonly known as:  ULTRAM Take 1 tablet (50 mg total) by mouth every 6 (six) hours as needed. What changed:  reasons to take this   traZODone 100 MG  tablet Commonly known as:  DESYREL TAKE ONE TABLET BY MOUTH AT BEDTIME What changed:    how much to take  how to take this  when to take this   triamcinolone cream 0.1 % Commonly known as:  KENALOG Apply 1 application topically 2 (two) times daily as needed (for rash). Applies to face       Disposition: 01-Home or Self Care  Discharge Instructions  Diet - low sodium heart healthy   Complete by:  As directed    Increase activity slowly   Complete by:  As directed       Signed: Alphonsa Overall, M.D., [facility] Office:  [phone]  08/24/2017, 11:24 AM

## 2017-08-24 NOTE — Care Management Important Message (Signed)
Important Message  Patient Details  Name: Austin [name] MRN: [id number] Date of Birth: 1955-09-20   Medicare Important Message Given:  Yes    Kerin Salen 08/24/2017, 11:01 AMImportant Message  Patient Details  Name: Austin [name] MRN: [id number] Date of Birth: 01-Nov-1955   Medicare Important Message Given:  Yes    Kerin Salen 08/24/2017, 11:00 AM

## 2017-08-24 NOTE — Discharge Instructions (Signed)
[facility] - DISCHARGE INSTRUCTIONS TO PATIENT  Wound Care:   May bathe/shower       Routine ostomy care  Diet:  As tolerated  Follow up appointment:  Call Dr. Pollie Friar office [facility]) at [phone] for an appointment in 2 to 3 weeks  Medications and dosages:  Resume your home medications.  Call Dr. Lucia Gaskins or his office  [phone]) if you have:  Temperature greater than 100.4,  Persistent nausea and vomiting,  Severe uncontrolled pain,  Redness, tenderness, or signs of infection (pain, swelling, redness, odor or green/yellow discharge around the site),  Difficulty breathing, headache or visual disturbances,  Any other questions or concerns you may have after discharge.  In an emergency, call 911 or go to an Emergency Department at a nearby hospital.

## 2017-08-25 ENCOUNTER — Other Ambulatory Visit: Payer: Self-pay

## 2017-08-25 ENCOUNTER — Other Ambulatory Visit: Payer: Self-pay | Admitting: Physician Assistant

## 2017-08-25 NOTE — Patient Outreach (Signed)
[facility]) Care Management  [place]  08/25/2017   Austin [name] 29-Jan-1956 [id number]  Subjective: Telephone call to patient and wife for transition of care follow up. Patient was discharged from the hospital on yesterday after stoma revision.  Wife is able to verify HIPAA.  She reports that patient is doing well and that he has had no pain.  She states new stoma site pink and looks like the pictures they have seen of a stoma.  We discussed stoma site and how it should look and what it should not look like and if there is decrease in color of site such as ashen look and less blood flow that he would need to be taken to the hospital. She verbalized understanding.  She states that he has stool coming from the site and that she is taking his temperature daily as recommended as sign of infection.  Discussed with wife signs of surgical site infection as well. She verbalized understanding.  We discussed follow up appointments. Patient to see Dr. Lucia Gaskins on Feb 6th but no primary care appointment until March.  Advised wife to get a follow up appointment sooner for his primary care doctor.  She verbalized understanding.      Objective:   Encounter Medications:  Outpatient Encounter Medications as of 08/25/2017  Medication Sig Note  . acetaminophen (TYLENOL) 500 MG tablet Take 1 tablet (500 mg total) by mouth every 6 (six) hours as needed for mild pain, moderate pain, fever or headache.   . albuterol (PROVENTIL HFA;VENTOLIN HFA) 108 (90 BASE) MCG/ACT inhaler Inhale 2 puffs into the lungs every 6 (six) hours as needed for wheezing or shortness of breath.   Marland Kitchen atorvastatin (LIPITOR) 20 MG tablet Take 20 mg by mouth daily with breakfast.    . cetirizine (ZYRTEC) 10 MG tablet Take 10 mg by mouth daily with breakfast.   . escitalopram (LEXAPRO) 10 MG tablet TAKE ONE TABLET BY MOUTH ONCE DAILY IN THE MORNING (Patient taking differently: TAKE 10 MG BY MOUTH ONCE DAILY IN THE MORNING)    . furosemide (LASIX) 20 MG tablet Take 1 tablet (20 mg total) by mouth daily. (Patient taking differently: Take 20 mg by mouth daily as needed for fluid. )   . ketoconazole (NIZORAL) 2 % cream APPLY AS NEEDED TO RASH (Patient taking differently: Apply to groin or legs daily as needed for rash)   . Melatonin 3 MG TABS Take 3 mg by mouth at bedtime as needed (for sleep).   . Multiple Vitamin (MULTIVITAMIN WITH MINERALS) TABS tablet Take 1 tablet by mouth daily with breakfast.  08/17/2017: Not taking for surgery  . pantoprazole (PROTONIX) 40 MG tablet Take 1 tablet (40 mg total) by mouth 2 (two) times daily.   Marland Kitchen saccharomyces boulardii (FLORASTOR) 250 MG capsule Take 1 capsule (250 mg total) by mouth 2 (two) times daily.   Marland Kitchen testosterone enanthate (DELATESTRYL) 200 MG/ML injection INJECT 1ML INTO THE MUSCLE EVERY 2 WEEKS (Patient taking differently: INJECT 200 MG INTO THE MUSCLE EVERY 2 WEEKS)   . traMADol (ULTRAM) 50 MG tablet Take 1 tablet (50 mg total) by mouth every 6 (six) hours as needed. (Patient taking differently: Take 50 mg by mouth every 6 (six) hours as needed for moderate pain. )   . traZODone (DESYREL) 100 MG tablet TAKE ONE TABLET BY MOUTH AT BEDTIME (Patient taking differently: TAKE 100 MG BY MOUTH AT BEDTIME)   . triamcinolone cream (KENALOG) 0.1 % Apply 1 application topically  2 (two) times daily as needed (for rash). Applies to face    No facility-administered encounter medications on file as of 08/25/2017.     Functional Status:  In your present state of health, do you have any difficulty performing the following activities: 08/25/2017 08/20/2017  Hearing? N N  Vision? N N  Difficulty concentrating or making decisions? N N  Walking or climbing stairs? Y Y  Comment - -  Dressing or bathing? Y Y  Comment - -  Doing errands, shopping? N N  Preparing Food and eating ? Y -  Using the Toilet? Y -  Do you have problems with loss of bowel control? N -  Managing your Medications? Y -   Managing your Finances? Y -  Housekeeping or managing your Housekeeping? Y -  Some recent data might be hidden    Fall/Depression Screening: Fall Risk  08/25/2017 06/19/2017 06/03/2017  Falls in the past year? No No No  Risk for fall due to : - - -  Risk for fall due to: Comment - - -   PHQ 2/9 Scores 08/25/2017 06/19/2017 06/03/2017 02/19/2017 05/25/2014  PHQ - 2 Score 0 0 0 0 0    Assessment: Patient continues to benefit from care manager outreach for disease management and support.   Plan:  [facility] CM Care Plan Problem One     Most Recent Value  Care Plan Problem One  at risk for readmission  Role Documenting the Problem One  Care Management Telephonic [place] for Problem One  Active  [facility] Long Term Goal   Patient will not be readmitted within the next 31 days.  [facility] Long Term Goal Start Date  08/25/17  Interventions for Problem One Long Term Goal  RN CM discussed with wife signs of infection and stoma viability.  THN CM Short Term Goal #1   Patient will maintain patency of ostomy site within the next 30 days.  [facility] CM Short Term Goal #1 Start Date  08/25/17  Interventions for Short Term Goal #1  RN CM discussed with wife signs of stoma site reduced circulation.    THN CM Short Term Goal #2   Patient/ caregiver be able to report signs of infection to surgical site within 30 days.  [facility]N CM Short Term Goal #2 Start Date  08/25/17  Interventions for Short Term Goal #2  RN CM discussed with wife signas of infection such as temperature, drainage to site, and redness.       RN CM will contact patient/caregiver in the month of January and patient/caregiver agrees to next outreach. RN CM will send barriers letter and assessment to physician.   [name], RN, MSN [facility] Care Management Care Management Coordinator Direct Line [phone] Toll Free: [phone]  Fax: [phone]

## 2017-09-01 ENCOUNTER — Other Ambulatory Visit: Payer: Self-pay

## 2017-09-01 ENCOUNTER — Encounter: Payer: Self-pay | Admitting: *Deleted

## 2017-09-01 NOTE — Patient Outreach (Signed)
[place] [facility]) Care Management  [place]  09/01/2017   KERRIGAN GLENDENING 1956/01/26 [id number]  Subjective: Telephone call to wife for weekly TOC.  She is able to verify HIPAA. She reports that patient appetite is good and stoma site looks good. She reports that patient still feels tired and weak but knows that patient has been through a lot and it will take time. Reiterated to continue to watch for infection and stoma site viability.  She verbalized understanding.    Objective:   Encounter Medications:  Outpatient Encounter Medications as of 09/01/2017  Medication Sig Note  . acetaminophen (TYLENOL) 500 MG tablet Take 1 tablet (500 mg total) by mouth every 6 (six) hours as needed for mild pain, moderate pain, fever or headache.   . albuterol (PROVENTIL HFA;VENTOLIN HFA) 108 (90 BASE) MCG/ACT inhaler Inhale 2 puffs into the lungs every 6 (six) hours as needed for wheezing or shortness of breath.   Marland Kitchen atorvastatin (LIPITOR) 20 MG tablet Take 20 mg by mouth daily with breakfast.    . cetirizine (ZYRTEC) 10 MG tablet Take 10 mg by mouth daily with breakfast.   . escitalopram (LEXAPRO) 10 MG tablet TAKE ONE TABLET BY MOUTH ONCE DAILY IN THE MORNING (Patient taking differently: TAKE 10 MG BY MOUTH ONCE DAILY IN THE MORNING)   . furosemide (LASIX) 20 MG tablet Take 1 tablet (20 mg total) by mouth daily. (Patient taking differently: Take 20 mg by mouth daily as needed for fluid. )   . ketoconazole (NIZORAL) 2 % cream APPLY AS NEEDED TO RASH (Patient taking differently: Apply to groin or legs daily as needed for rash)   . Melatonin 3 MG TABS Take 3 mg by mouth at bedtime as needed (for sleep).   . Multiple Vitamin (MULTIVITAMIN WITH MINERALS) TABS tablet Take 1 tablet by mouth daily with breakfast.  08/17/2017: Not taking for surgery  . pantoprazole (PROTONIX) 40 MG tablet TAKE 1 TABLET BY MOUTH TWICE DAILY   . saccharomyces boulardii (FLORASTOR) 250 MG capsule Take 1 capsule (250  mg total) by mouth 2 (two) times daily.   Marland Kitchen testosterone enanthate (DELATESTRYL) 200 MG/ML injection INJECT 1ML INTO THE MUSCLE EVERY 2 WEEKS (Patient taking differently: INJECT 200 MG INTO THE MUSCLE EVERY 2 WEEKS)   . traMADol (ULTRAM) 50 MG tablet Take 1 tablet (50 mg total) by mouth every 6 (six) hours as needed. (Patient taking differently: Take 50 mg by mouth every 6 (six) hours as needed for moderate pain. )   . traZODone (DESYREL) 100 MG tablet TAKE ONE TABLET BY MOUTH AT BEDTIME (Patient taking differently: TAKE 100 MG BY MOUTH AT BEDTIME)   . triamcinolone cream (KENALOG) 0.1 % Apply 1 application topically 2 (two) times daily as needed (for rash). Applies to face    No facility-administered encounter medications on file as of 09/01/2017.     Functional Status:  In your present state of health, do you have any difficulty performing the following activities: 08/25/2017 08/20/2017  Hearing? N N  Vision? N N  Difficulty concentrating or making decisions? N N  Walking or climbing stairs? Y Y  Comment - -  Dressing or bathing? Y Y  Comment - -  Doing errands, shopping? N N  Preparing Food and eating ? Y -  Using the Toilet? Y -  Do you have problems with loss of bowel control? N -  Managing your Medications? Y -  Managing your Finances? Y -  Housekeeping or managing  your Housekeeping? Y -  Some recent data might be hidden    Fall/Depression Screening: Fall Risk  08/25/2017 06/19/2017 06/03/2017  Falls in the past year? No No No  Risk for fall due to : - - -  Risk for fall due to: Comment - - -   PHQ 2/9 Scores 08/25/2017 06/19/2017 06/03/2017 02/19/2017 05/25/2014  PHQ - 2 Score 0 0 0 0 0    Assessment: Patient continues to benefit from care manager outreach for disease management and support.    Plan:  [facility] CM Care Plan Problem One     Most Recent Value  Care Plan Problem One  at risk for readmission  Role Documenting the Problem One  Care Management Telephonic [place] for Problem One  Active  [facility] Long Term Goal   Patient will not be readmitted within the next 31 days.  [facility] Long Term Goal Start Date  08/25/17  Interventions for Problem One Long Term Goal  Retiterated  with wife signs of infection and stoma viability.  THN CM Short Term Goal #1   Patient will maintain patency of ostomy site within the next 30 days.  [facility]N CM Short Term Goal #1 Start Date  08/25/17  Interventions for Short Term Goal #1  Reiterated with wife maintaining stoma site.    THN CM Short Term Goal #2   Patient/ caregiver be able to report signs of infection to surgical site within 30 days.  [facility]N CM Short Term Goal #2 Start Date  08/25/17  Interventions for Short Term Goal #2  Reiterated what to look for with stoma site.       RN CM will contact patient/caregiver next week and patient/caregiver agrees to next outreach.  [name], RN, MSN [facility] Care Management Care Management Coordinator Direct Line [phone] Toll Free: [phone]  Fax: [phone]

## 2017-09-08 ENCOUNTER — Other Ambulatory Visit: Payer: Self-pay

## 2017-09-08 NOTE — Patient Outreach (Signed)
[place] [facility]) Care Management  09/08/2017  FAWAZ BORQUEZ 1956-04-18 [id number]   Telephone call to patient/caregiver for transition of care call.  No answer.  States VM full.    Plan: RN CM will attempt again within 3 business days.    [name], RN, MSN [facility] Care Management Care Management Coordinator Direct Line [phone] Toll Free: [phone]  Fax: [phone]

## 2017-09-09 ENCOUNTER — Other Ambulatory Visit: Payer: Self-pay

## 2017-09-09 NOTE — Patient Outreach (Signed)
[place] [facility]) Care Management  09/09/2017  ANTHONIO MIZZELL Aug 06, 1956 [id number]   Telephone call to patient and wife for weekly Transition of care call. Wife is able to verify HIPAA. She reports that patient is doing good and that his stoma site looks nice and pink.  She states is stronger and does not complain of being cold so much. She feels like he is making progress.  She reports that surgical sites look good. No swelling, redness or drainage.  She denies any problems with ostomy supplies and that they are actually using his old ostomy supplies with no problems.  Wife voices no issues or problems presently.    Plan: RN CM will contact patient again next week.  Patient and wife in agreement.    [name], RN, MSN [facility] Care Management Care Management Coordinator Direct Line [phone] Toll Free: [phone]  Fax: [phone]

## 2017-09-11 ENCOUNTER — Encounter: Payer: Self-pay | Admitting: Internal Medicine

## 2017-09-11 ENCOUNTER — Ambulatory Visit: Payer: PPO | Admitting: Internal Medicine

## 2017-09-11 ENCOUNTER — Other Ambulatory Visit (INDEPENDENT_AMBULATORY_CARE_PROVIDER_SITE_OTHER): Payer: PPO

## 2017-09-11 VITALS — BP 126/78 | HR 72 | Ht 72.0 in | Wt 170.0 lb

## 2017-09-11 DIAGNOSIS — Z8719 Personal history of other diseases of the digestive system: Secondary | ICD-10-CM | POA: Diagnosis not present

## 2017-09-11 DIAGNOSIS — Z8711 Personal history of peptic ulcer disease: Secondary | ICD-10-CM

## 2017-09-11 DIAGNOSIS — Z8619 Personal history of other infectious and parasitic diseases: Secondary | ICD-10-CM

## 2017-09-11 DIAGNOSIS — D509 Iron deficiency anemia, unspecified: Secondary | ICD-10-CM | POA: Diagnosis not present

## 2017-09-11 DIAGNOSIS — Z933 Colostomy status: Secondary | ICD-10-CM

## 2017-09-11 LAB — CBC WITH DIFFERENTIAL/PLATELET
BASOS PCT: 1.2 % (ref 0.0–3.0)
Basophils Absolute: 0.1 10*3/uL (ref 0.0–0.1)
EOS ABS: 0.4 10*3/uL (ref 0.0–0.7)
Eosinophils Relative: 5 % (ref 0.0–5.0)
HEMATOCRIT: 36.8 % — AB (ref 39.0–52.0)
Hemoglobin: 11.9 g/dL — ABNORMAL LOW (ref 13.0–17.0)
LYMPHS ABS: 0.9 10*3/uL (ref 0.7–4.0)
Lymphocytes Relative: 11.8 % — ABNORMAL LOW (ref 12.0–46.0)
MCHC: 32.4 g/dL (ref 30.0–36.0)
MCV: 83.9 fl (ref 78.0–100.0)
MONOS PCT: 9.9 % (ref 3.0–12.0)
Monocytes Absolute: 0.8 10*3/uL (ref 0.1–1.0)
NEUTROS ABS: 5.5 10*3/uL (ref 1.4–7.7)
NEUTROS PCT: 72.1 % (ref 43.0–77.0)
PLATELETS: 254 10*3/uL (ref 150.0–400.0)
RBC: 4.39 Mil/uL (ref 4.22–5.81)
RDW: 24.5 % — AB (ref 11.5–15.5)
WBC: 7.6 10*3/uL (ref 4.0–10.5)

## 2017-09-11 LAB — IBC PANEL
Iron: 36 ug/dL — ABNORMAL LOW (ref 42–165)
Saturation Ratios: 13.8 % — ABNORMAL LOW (ref 20.0–50.0)
Transferrin: 187 mg/dL — ABNORMAL LOW (ref 212.0–360.0)

## 2017-09-11 LAB — FERRITIN: Ferritin: 37.9 ng/mL (ref 22.0–322.0)

## 2017-09-11 MED ORDER — PANTOPRAZOLE SODIUM 40 MG PO TBEC: 40.0000 mg | DELAYED_RELEASE_TABLET | Freq: Every day | ORAL | 4 refills | Status: DC

## 2017-09-11 NOTE — Progress Notes (Signed)
Subjective:    Patient ID: Austin [name], male    DOB: 01-29-1956, 62 y.o.   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 difficile colitis, diabetes who is here for follow-up.  He is here today with his wife.  He has been doing well and is recovering nicely after repeat colon surgery and creation of a new colostomy after his old stoma developed stricturing.  His appetite has been improving.  Ostomy is functioning well.  No abdominal pain.  No nausea or vomiting.  No blood in his stool or melena.  He did receive IV iron several months ago for iron deficiency and his hemoglobin has yet to normalize.  He was feeling "cold" and thought that this may have been related to Florastor.  He stopped his probiotic and this feeling seemed to improve.  He has continued pantoprazole 40 mg twice daily.  Review of Systems As per HPI, otherwise negative  Current Medications, Allergies, Past Medical History, Past Surgical History, Family History and Social History were reviewed in [employer] record.     Objective:   Physical Exam BP 126/78   Pulse 72   Ht 6' (1.829 m)   Wt 170 lb (77.1 kg)   BMI 23.06 kg/m  Constitutional: Well-developed and well-nourished. No distress. HEENT: Normocephalic and atraumatic. Oropharynx is clear and moist. Conjunctivae are normal.  No scleral icterus. Neck: Neck supple. Trachea midline.  Prior tracheostomy site well-healed Cardiovascular: Normal rate, regular rhythm and intact distal pulses. No M/R/G Pulmonary/chest: Effort normal and breath sounds normal.  Abdominal: Soft, nontender, nondistended. Bowel sounds  active throughout.  Colostomy left mid abdomen without surrounding tenderness Extremities: no clubbing, cyanosis, or edema Skin: Skin is warm and dry. Psychiatric: Normal mood and affect. Behavior is normal.  CBC    Component Value Date/Time   WBC 7.6 09/11/2017 1423   RBC 4.39 09/11/2017 1423   HGB 11.9 (L) 09/11/2017 1423   HGB 8.6 (L) 07/14/2017 0859   HCT 36.8 (L) 09/11/2017 1423   HCT 29.7 (L) 07/14/2017 0859   PLT 254.0 09/11/2017 1423   PLT 373 07/14/2017 0859   MCV 83.9 09/11/2017 1423   MCV 80.7 07/14/2017 0859   MCH 26.6 08/21/2017 0519   MCHC 32.4 09/11/2017 1423   RDW 24.5 (H) 09/11/2017 1423   RDW 18.8 (H) 07/14/2017 0859   LYMPHSABS 0.9 09/11/2017 1423   LYMPHSABS 1.4 07/14/2017 0859   MONOABS 0.8 09/11/2017 1423   MONOABS 0.9 07/14/2017 0859   EOSABS 0.4 09/11/2017 1423   EOSABS 0.4 07/14/2017 0859   BASOSABS 0.1 09/11/2017 1423   BASOSABS 0.1 07/14/2017 0859   CMP     Component Value Date/Time   NA 137 08/24/2017 0430   NA 138 03/13/2017 0922   K 4.2 08/24/2017 0430   K 4.6 03/13/2017 0922   CL 109 08/24/2017 0430   CO2 24 08/24/2017 0430   CO2 24 03/13/2017 0922   GLUCOSE 102 (H) 08/24/2017 0430   GLUCOSE 91 03/13/2017 0922   BUN 9 08/24/2017 0430   BUN 27.4 (H) 03/13/2017 0922   CREATININE 0.98 08/24/2017 0430   CREATININE 0.87 06/05/2017 1643   CREATININE 1.3 03/13/2017 0254  CALCIUM 8.2 (L) 08/24/2017 0430   CALCIUM 9.3 03/13/2017 0922   PROT 7.2 06/15/2017 1414   PROT 7.9 03/13/2017 0922   ALBUMIN 2.6 (L) 06/15/2017 1414   ALBUMIN 3.0 (L) 03/13/2017 0922   AST 25 06/15/2017 1414   AST 17 03/13/2017 0922   ALT 16 (L) 06/15/2017 1414   ALT 21 03/13/2017 0922   ALKPHOS 67 06/15/2017 1414   ALKPHOS 91 03/13/2017 0922   BILITOT 0.9 06/15/2017 1414   BILITOT 0.27 03/13/2017 0922   GFRNONAA >60 08/24/2017 0430   GFRAA >60 08/24/2017 0430   Iron/TIBC/Ferritin/ %Sat    Component Value Date/Time   IRON 36 (L) 09/11/2017 1423   TIBC 249  (L) 05/28/2017 1730   FERRITIN 37.9 09/11/2017 1423   IRONPCTSAT 13.8 (L) 09/11/2017 1423       Assessment & Plan:  62 year old male with a past medical history of complicated diverticulitis status post left 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 difficile colitis, diabetes who is here for follow-up.   1.  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  Scar tissue was also removed which had lysed to the underside of the colostomy but also the bed of prior left nephrectomy.  Ostomy is working well.  2.  History of iron deficiency anemia --iron studies remain borderline low ferritin has increased to 38.  Percent iron saturation is still low.  Hemoglobin has improved post surgery and is now 11.9.  Would likely benefit from one additional iron infusion followed by monitoring of hemoglobin and iron studies  3.  Peptic ulcer disease --history of gastric ulcers contributing to GI bleeding and iron deficiency.  He has been on twice daily PPI.  Biopsies were negative for H. pylori.  Will decrease to PPI once daily at this point.  Continue pantoprazole 40 mg once daily  4.  History of C. Difficile --he stopped his probiotic which I think is okay.  I have recommended that he resume Florastor 250 mg twice daily with any future antibiotics and continue for 2-4 weeks thereafter  5.  Metastatic renal cell cancer --he is following with oncology  6.  History of CVA/quadriplegia --Plavix has been on hold.  Okay from a GI perspective to resume at this time.  Will defer to his primary care provider Dr. [name]  25 minutes spent with the patient today. Greater than 50% was spent in counseling and  coordination of care with the patient

## 2017-09-11 NOTE — Patient Instructions (Addendum)
Please decrease your Pantoprazole to once daily.  Discontinue Florastor.  Your physician has requested that you go to the basement for the following lab work before leaving today: CBC, IBC, Ferritin  If you are age 63 or older, your body mass index should be between 23-30. Your Body mass index is 23.06 kg/m. If this is out of the aforementioned range listed, please consider follow up with your Primary Care Provider.  If you are age 51 or younger, your body mass index should be between 19-25. Your Body mass index is 23.06 kg/m. If this is out of the aformentioned range listed, please consider follow up with your Primary Care Provider.

## 2017-09-15 ENCOUNTER — Other Ambulatory Visit: Payer: Self-pay

## 2017-09-15 DIAGNOSIS — D509 Iron deficiency anemia, unspecified: Secondary | ICD-10-CM

## 2017-09-15 NOTE — Patient Outreach (Signed)
[facility]) Care Management  [place]  09/15/2017   Austin [name] Aug 31, 1955 [id number]  Subjective: Telephone call to wife/patient for transition of care call.  Wife is able to verify HIPAA.  She reports that patient is getting stronger daily.  She reports that patient is not complaining of being cold as much.  Patient saw Dr. Hilarie Fredrickson on Friday and blood work was done to check his hemoglobin to make sure things were doing ok.  Patient to see surgeon on tomorrow for follow up.  Wife reports that stoma site continues to be pink and has not shrunk.  She reports no signs of bleeding or infection.  Discussed continuing to monitor for any problems.  She verbalized understanding.   Objective:   Encounter Medications:  Outpatient Encounter Medications as of 09/15/2017  Medication Sig Note  . acetaminophen (TYLENOL) 500 MG tablet Take 1 tablet (500 mg total) by mouth every 6 (six) hours as needed for mild pain, moderate pain, fever or headache.   . albuterol (PROVENTIL HFA;VENTOLIN HFA) 108 (90 BASE) MCG/ACT inhaler Inhale 2 puffs into the lungs every 6 (six) hours as needed for wheezing or shortness of breath.   Marland Kitchen atorvastatin (LIPITOR) 20 MG tablet Take 20 mg by mouth daily with breakfast.    . cetirizine (ZYRTEC) 10 MG tablet Take 10 mg by mouth daily with breakfast.   . escitalopram (LEXAPRO) 10 MG tablet TAKE ONE TABLET BY MOUTH ONCE DAILY IN THE MORNING (Patient taking differently: TAKE 10 MG BY MOUTH ONCE DAILY IN THE MORNING)   . furosemide (LASIX) 20 MG tablet Take 1 tablet (20 mg total) by mouth daily. (Patient taking differently: Take 20 mg by mouth daily as needed for fluid. )   . ketoconazole (NIZORAL) 2 % cream APPLY AS NEEDED TO RASH (Patient taking differently: Apply to groin or legs daily as needed for rash)   . Melatonin 3 MG TABS Take 3 mg by mouth at bedtime as needed (for sleep).   . Multiple Vitamin (MULTIVITAMIN WITH MINERALS) TABS tablet Take 1 tablet  by mouth daily with breakfast.  08/17/2017: Not taking for surgery  . pantoprazole (PROTONIX) 40 MG tablet Take 1 tablet (40 mg total) by mouth daily.   Marland Kitchen testosterone enanthate (DELATESTRYL) 200 MG/ML injection INJECT 1ML INTO THE MUSCLE EVERY 2 WEEKS (Patient taking differently: INJECT 200 MG INTO THE MUSCLE EVERY 2 WEEKS)   . traMADol (ULTRAM) 50 MG tablet Take 1 tablet (50 mg total) by mouth every 6 (six) hours as needed. (Patient taking differently: Take 50 mg by mouth every 6 (six) hours as needed for moderate pain. )   . traZODone (DESYREL) 100 MG tablet TAKE ONE TABLET BY MOUTH AT BEDTIME (Patient taking differently: TAKE 100 MG BY MOUTH AT BEDTIME)   . triamcinolone cream (KENALOG) 0.1 % Apply 1 application topically 2 (two) times daily as needed (for rash). Applies to face    No facility-administered encounter medications on file as of 09/15/2017.     Functional Status:  In your present state of health, do you have any difficulty performing the following activities: 08/25/2017 08/20/2017  Hearing? N N  Vision? N N  Difficulty concentrating or making decisions? N N  Walking or climbing stairs? Y Y  Comment - -  Dressing or bathing? Y Y  Comment - -  Doing errands, shopping? N N  Preparing Food and eating ? Y -  Using the Toilet? Y -  Do you have problems with  loss of bowel control? N -  Managing your Medications? Y -  Managing your Finances? Y -  Housekeeping or managing your Housekeeping? Y -  Some recent data might be hidden    Fall/Depression Screening: Fall Risk  08/25/2017 06/19/2017 06/03/2017  Falls in the past year? No No No  Risk for fall due to : - - -  Risk for fall due to: Comment - - -   PHQ 2/9 Scores 08/25/2017 06/19/2017 06/03/2017 02/19/2017 05/25/2014  PHQ - 2 Score 0 0 0 0 0    Assessment: Patient continues to benefit from care manager outreach for disease management and support.    Plan:  [facility] CM Care Plan Problem One     Most Recent Value  Care Plan  Problem One  at risk for readmission  Role Documenting the Problem One  Care Management Telephonic [place] for Problem One  Active  [facility] Long Term Goal   Patient will not be readmitted within the next 31 days.  THN Long Term Goal Start Date  08/25/17  Interventions for Problem One Long Term Goal  RN CM discussed with wife continuing to watch stoma patency and signs of bleeding from stoma.    THN CM Short Term Goal #1   Patient will maintain patency of ostomy site within the next 30 days.  THN CM Short Term Goal #1 Start Date  08/25/17  Interventions for Short Term Goal #1  RN CM discussed with wife watching for patency of stoma site.    THN CM Short Term Goal #2   Patient/ caregiver be able to report signs of infection to surgical site within 30 days.  THN CM Short Term Goal #2 Start Date  08/25/17  Interventions for Short Term Goal #2  RN CM discussed with wife continuing to monitor for infection.       RN CM will contact patient next week and patient/wife agrees to next outreach.    [name], RN, MSN [facility] Care Management Care Management Coordinator Direct Line [phone] Toll Free: [phone]  Fax: [phone]

## 2017-09-21 ENCOUNTER — Ambulatory Visit (HOSPITAL_COMMUNITY)
Admission: RE | Admit: 2017-09-21 | Discharge: 2017-09-21 | Disposition: A | Discharge status: Home / Self Care | Payer: PPO | Source: Ambulatory Visit | Attending: Internal Medicine | Admitting: Internal Medicine

## 2017-09-21 ENCOUNTER — Encounter (HOSPITAL_COMMUNITY): Payer: Self-pay

## 2017-09-21 DIAGNOSIS — D509 Iron deficiency anemia, unspecified: Secondary | ICD-10-CM | POA: Diagnosis not present

## 2017-09-21 MED ORDER — SODIUM CHLORIDE 0.9 % IV SOLN
510.0000 mg | Freq: Once | INTRAVENOUS | Status: AC
  Administered 2017-09-21: 510 mg via INTRAVENOUS
  Filled 2017-09-21: qty 17

## 2017-09-21 MED ORDER — SODIUM CHLORIDE 0.9 % IV SOLN
INTRAVENOUS | Status: DC
  Administered 2017-09-21: 10:00:00 via INTRAVENOUS

## 2017-09-22 ENCOUNTER — Other Ambulatory Visit: Payer: Self-pay

## 2017-09-22 NOTE — Patient Outreach (Signed)
[facility]) Care Management  09/22/2017  Austin [name] 02-14-1956 [id number]   Telephone call to patient for weekly transition of care call.  No answer.  Unable to leave a voice message.    Plan: RN CM will attempt patient again in one business day.  [name], RN, MSN [facility] Care Management Care Management Coordinator Direct Line [phone] Toll Free: [phone]  Fax: [phone]'

## 2017-09-23 ENCOUNTER — Other Ambulatory Visit: Payer: Self-pay

## 2017-09-23 NOTE — Patient Outreach (Signed)
[place] [facility]) Care Management  [facility] Care Manager  09/23/2017   Austin [name] 1956/07/09 [id number]  Subjective: Telephone call to wife and patient for transition of care. Wife is able to verify HIPAA as patient previous CVA patient and unable to speak well.  She reports that patient is doing good.  She reports that patient stoma sight looks good.  Patient appetite is good.  Patient stronger per wife as well.  Discussed stoma sight and continuing to monitor site. She verbalized understanding.    Objective:   Encounter Medications:  Outpatient Encounter Medications as of 09/23/2017  Medication Sig Note  . acetaminophen (TYLENOL) 500 MG tablet Take 1 tablet (500 mg total) by mouth every 6 (six) hours as needed for mild pain, moderate pain, fever or headache.   . albuterol (PROVENTIL HFA;VENTOLIN HFA) 108 (90 BASE) MCG/ACT inhaler Inhale 2 puffs into the lungs every 6 (six) hours as needed for wheezing or shortness of breath.   Marland Kitchen atorvastatin (LIPITOR) 20 MG tablet Take 20 mg by mouth daily with breakfast.    . cetirizine (ZYRTEC) 10 MG tablet Take 10 mg by mouth daily with breakfast.   . escitalopram (LEXAPRO) 10 MG tablet TAKE ONE TABLET BY MOUTH ONCE DAILY IN THE MORNING (Patient taking differently: TAKE 10 MG BY MOUTH ONCE DAILY IN THE MORNING)   . furosemide (LASIX) 20 MG tablet Take 1 tablet (20 mg total) by mouth daily. (Patient taking differently: Take 20 mg by mouth daily as needed for fluid. )   . ketoconazole (NIZORAL) 2 % cream APPLY AS NEEDED TO RASH (Patient taking differently: Apply to groin or legs daily as needed for rash)   . Melatonin 3 MG TABS Take 3 mg by mouth at bedtime as needed (for sleep).   . Multiple Vitamin (MULTIVITAMIN WITH MINERALS) TABS tablet Take 1 tablet by mouth daily with breakfast.  08/17/2017: Not taking for surgery  . pantoprazole (PROTONIX) 40 MG tablet Take 1 tablet (40 mg total) by mouth daily.   Marland Kitchen testosterone enanthate (DELATESTRYL)  200 MG/ML injection INJECT 1ML INTO THE MUSCLE EVERY 2 WEEKS (Patient taking differently: INJECT 200 MG INTO THE MUSCLE EVERY 2 WEEKS)   . traMADol (ULTRAM) 50 MG tablet Take 1 tablet (50 mg total) by mouth every 6 (six) hours as needed. (Patient taking differently: Take 50 mg by mouth every 6 (six) hours as needed for moderate pain. )   . traZODone (DESYREL) 100 MG tablet TAKE ONE TABLET BY MOUTH AT BEDTIME (Patient taking differently: TAKE 100 MG BY MOUTH AT BEDTIME)   . triamcinolone cream (KENALOG) 0.1 % Apply 1 application topically 2 (two) times daily as needed (for rash). Applies to face    No facility-administered encounter medications on file as of 09/23/2017.     Functional Status:  In your present state of health, do you have any difficulty performing the following activities: 09/21/2017 08/25/2017  Hearing? N N  Vision? N N  Difficulty concentrating or making decisions? N N  Walking or climbing stairs? Y Y  Comment - -  Dressing or bathing? Y Y  Comment - -  Doing errands, shopping? - [employer] and eating ? - Y  Using the Toilet? - Y  Do you have problems with loss of bowel control? - N  Managing your Medications? - Y  Managing your Finances? - Y  Housekeeping or managing your Housekeeping? - Y  Some recent data might be hidden    Fall/Depression Screening:  Fall Risk  08/25/2017 06/19/2017 06/03/2017  Falls in the past year? No No No  Risk for fall due to : - - -  Risk for fall due to: Comment - - -   PHQ 2/9 Scores 08/25/2017 06/19/2017 06/03/2017 02/19/2017 05/25/2014  PHQ - 2 Score 0 0 0 0 0    Assessment: Patient continues to benefit from care manager outreach for disease management and support.    Plan:  [facility] CM Care Plan Problem One     Most Recent Value  Care Plan Problem One  at risk for readmission  Role Documenting the Problem One  Care Management Telephonic [place] for Problem One  Active  [facility] Long Term Goal   Patient will not be  readmitted within the next 31 days.  THN Long Term Goal Start Date  08/25/17  Interventions for Problem One Long Term Goal  Wife continues to assess for stoma site patency.  Wife able to call out signs of GI bleeding.      THN CM Short Term Goal #1   Patient will maintain patency of ostomy site within the next 30 days.  THN CM Short Term Goal #1 Start Date  08/25/17  Interventions for Short Term Goal #1  Wife continues with assessing stoma site for patency.  Discussed signs blockage of stoma sight.    THN CM Short Term Goal #2   Patient/ caregiver be able to report signs of infection to surgical site within 30 days.  THN CM Short Term Goal #2 Start Date  08/25/17  [facility] CM Short Term Goal #2 Met Date  09/23/17  Interventions for Short Term Goal #2  no signs of infection.       RN CM will contact patient next week and patient/caregiver agrees to next outreach.  [name], RN, MSN [facility] Care Management Care Management Coordinator Direct Line [phone] Toll Free: [phone]  Fax: [phone]

## 2017-09-28 ENCOUNTER — Other Ambulatory Visit: Payer: Self-pay

## 2017-09-28 NOTE — Patient Outreach (Signed)
[place] [facility]) Care Management  [facility] Care Manager  09/28/2017   Austin [name] [birth year] [id number]  Subjective: Telephone call to wife for last weekly transition of care call. Wife reports that patient is doing well. Patient to have friend who is a therapist to come in and show him some exercises for strengthening. She reports that patient stoma site continues to be pink and no signs of blockage.  Discussed with wife the ending of weekly calls and that CM would call in March for final call as long as there are no new issues. She verbalized understanding and agrees.     Objective:   Encounter Medications:  Outpatient Encounter Medications as of 09/28/2017  Medication Sig Note  . acetaminophen (TYLENOL) 500 MG tablet Take 1 tablet (500 mg total) by mouth every 6 (six) hours as needed for mild pain, moderate pain, fever or headache.   . albuterol (PROVENTIL HFA;VENTOLIN HFA) 108 (90 BASE) MCG/ACT inhaler Inhale 2 puffs into the lungs every 6 (six) hours as needed for wheezing or shortness of breath.   Marland Kitchen atorvastatin (LIPITOR) 20 MG tablet Take 20 mg by mouth daily with breakfast.    . cetirizine (ZYRTEC) 10 MG tablet Take 10 mg by mouth daily with breakfast.   . escitalopram (LEXAPRO) 10 MG tablet TAKE ONE TABLET BY MOUTH ONCE DAILY IN THE MORNING (Patient taking differently: TAKE 10 MG BY MOUTH ONCE DAILY IN THE MORNING)   . furosemide (LASIX) 20 MG tablet Take 1 tablet (20 mg total) by mouth daily. (Patient taking differently: Take 20 mg by mouth daily as needed for fluid. )   . ketoconazole (NIZORAL) 2 % cream APPLY AS NEEDED TO RASH (Patient taking differently: Apply to groin or legs daily as needed for rash)   . Melatonin 3 MG TABS Take 3 mg by mouth at bedtime as needed (for sleep).   . Multiple Vitamin (MULTIVITAMIN WITH MINERALS) TABS tablet Take 1 tablet by mouth daily with breakfast.  08/17/2017: Not taking for surgery  . pantoprazole (PROTONIX) 40 MG tablet Take 1  tablet (40 mg total) by mouth daily.   Marland Kitchen testosterone enanthate (DELATESTRYL) 200 MG/ML injection INJECT 1ML INTO THE MUSCLE EVERY 2 WEEKS (Patient taking differently: INJECT 200 MG INTO THE MUSCLE EVERY 2 WEEKS)   . traMADol (ULTRAM) 50 MG tablet Take 1 tablet (50 mg total) by mouth every 6 (six) hours as needed. (Patient taking differently: Take 50 mg by mouth every 6 (six) hours as needed for moderate pain. )   . traZODone (DESYREL) 100 MG tablet TAKE ONE TABLET BY MOUTH AT BEDTIME (Patient taking differently: TAKE 100 MG BY MOUTH AT BEDTIME)   . triamcinolone cream (KENALOG) 0.1 % Apply 1 application topically 2 (two) times daily as needed (for rash). Applies to face    No facility-administered encounter medications on file as of 09/28/2017.     Functional Status:  In your present state of health, do you have any difficulty performing the following activities: 09/21/2017 08/25/2017  Hearing? N N  Vision? N N  Difficulty concentrating or making decisions? N N  Walking or climbing stairs? Y Y  Comment - -  Dressing or bathing? Y Y  Comment - -  Doing errands, shopping? - [employer] and eating ? - Y  Using the Toilet? - Y  Do you have problems with loss of bowel control? - N  Managing your Medications? - Y  Managing your Finances? - Y  Housekeeping or  managing your Housekeeping? - Y  Some recent data might be hidden    Fall/Depression Screening: Fall Risk  08/25/2017 06/19/2017 06/03/2017  Falls in the past year? No No No  Risk for fall due to : - - -  Risk for fall due to: Comment - - -   PHQ 2/9 Scores 08/25/2017 06/19/2017 06/03/2017 02/19/2017 05/25/2014  PHQ - 2 Score 0 0 0 0 0    Assessment: Patient continues to benefit from care manager outreach for disease management and support.    Plan:  [facility] CM Care Plan Problem One     Most Recent Value  Care Plan Problem One  at risk for readmission  Role Documenting the Problem One  Care Management Telephonic [place] for Problem One  Active  [facility] Long Term Goal   Patient will not be readmitted within the next 31 days.  THN Long Term Goal Start Date  08/25/17  THN Long Term Goal Met Date  09/28/17  Interventions for Problem One Long Term Goal  Patient has done well with new stoma site and continues to strengthen daily.   THN CM Short Term Goal #1   Patient will maintain patency of ostomy site within the next 30 days.  THN CM Short Term Goal #1 Start Date  09/28/17  Interventions for Short Term Goal #1  Wife to continue to assess stoma site for patency daily and watch for any bleeding at the site.    THN CM Short Term Goal #2   Patient/ caregiver be able to report signs of infection to surgical site within 30 days.  THN CM Short Term Goal #2 Start Date  08/25/17  [facility] CM Short Term Goal #2 Met Date  09/23/17  Interventions for Short Term Goal #2  no signs of infection.       RN CM will contact patient in the month of March and caregiver/patient agrees to next outreach. RN CM will close on next call if no new issues.   [name], RN, MSN [facility] Care Management Care Management Coordinator Direct Line [phone] Toll Free: [phone]  Fax: [phone]

## 2017-09-30 ENCOUNTER — Other Ambulatory Visit: Payer: Self-pay | Admitting: Family Medicine

## 2017-10-09 ENCOUNTER — Other Ambulatory Visit: Payer: Self-pay | Admitting: Family Medicine

## 2017-10-09 ENCOUNTER — Other Ambulatory Visit: Payer: Self-pay | Admitting: Physician Assistant

## 2017-10-12 ENCOUNTER — Ambulatory Visit (INDEPENDENT_AMBULATORY_CARE_PROVIDER_SITE_OTHER): Payer: PPO | Admitting: Family Medicine

## 2017-10-12 ENCOUNTER — Encounter: Payer: Self-pay | Admitting: Family Medicine

## 2017-10-12 VITALS — BP 130/80 | Temp 98.1°F

## 2017-10-12 DIAGNOSIS — R5383 Other fatigue: Secondary | ICD-10-CM

## 2017-10-12 DIAGNOSIS — R531 Weakness: Secondary | ICD-10-CM

## 2017-10-12 DIAGNOSIS — R300 Dysuria: Secondary | ICD-10-CM

## 2017-10-12 DIAGNOSIS — R7989 Other specified abnormal findings of blood chemistry: Secondary | ICD-10-CM | POA: Diagnosis not present

## 2017-10-12 LAB — POCT URINALYSIS DIPSTICK
BILIRUBIN UA: NEGATIVE
Glucose, UA: NEGATIVE
KETONES UA: NEGATIVE
Nitrite, UA: POSITIVE
Protein, UA: NEGATIVE
SPEC GRAV UA: 1.01 (ref 1.010–1.025)
UROBILINOGEN UA: 0.2 U/dL
pH, UA: 6 (ref 5.0–8.0)

## 2017-10-12 LAB — TESTOSTERONE: Testosterone: 107.67 ng/dL — ABNORMAL LOW (ref 300.00–890.00)

## 2017-10-12 MED ORDER — CIPROFLOXACIN HCL 500 MG PO TABS: 500.0000 mg | ORAL_TABLET | Freq: Two times a day (BID) | ORAL | 0 refills | Status: DC

## 2017-10-12 NOTE — Progress Notes (Signed)
Subjective:     Patient ID: Austin [name], male   DOB: 08/09/1956, 62 y.o.   MRN: [id number]  HPI Patient is seen for medical follow-up. 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 He had recent iron transfusion per GI. Follow-up hemoglobin 11.9 with ferritin 37.9. Patient had normal TSH back in August and normal B12 level and October. His major complaint is increasing fatigue.  He does have low testosterone and had normal levels last May when these were last checked. He complains of some generalized weakness and fatigue. They wonder how much of this may be related to his metastatic cancer versus other.  He has neurogenic bladder history and history of frequent UTI. Question low-grade fever past couple days. Intermittent chills. Urine has been more cloudy than usual until today. No hematuria.  Past Medical History:  Diagnosis Date  . Brainstem stroke ([place]) 10/16/2008   Qualifier: Diagnosis of  By: [name] LPN, [name]    . C. difficile colitis 02/08/2017  . Cerebrovascular accident ([place]) 1994   hx of brainstem stroke, residual diminished lung capacity  . Chronic kidney disease    Left Renal Mass  . 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 Diverticulitis   . Duodenal ulcer   . GIB (gastrointestinal bleeding) 05/28/2017  . Hx of colonic polyps   . Hypertension    has been off BP meds since GI bleed was thought to have been caused by previously prescribed lisinopril   . Hypogonadism   . Intra-abdominal abscess ([place]) 01/23/2017  . Iron deficiency anemia   . 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 Neuromuscular disorder ([place])     quadraplegic  . Open abdominal incision with drainage 01/26/2017  . Pituitary adenoma ([place])   . 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 Pneumonia   . PONV (postoperative nausea and vomiting)    vomited after colostomy placement   . Pre-diabetes    last a1c 5.6   . 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[place])   . Urinary incontinence   . Venous stasis    edema   Past Surgical History:  Procedure Laterality Date  . CHOLECYSTECTOMY    . COLON RESECTION N/A 05/04/2017   Procedure: LAPAROSCOPIC SIGMOID COLON RESECTION WITH END COLSOTOMY ERAS PATHWAY;  Surgeon: Alphonsa Overall, MD;  Location: [facility];  Service: General;  Laterality: N/A;  . COLONOSCOPY  02/27/2012   Procedure: COLONOSCOPY;  Surgeon: Jerene Bears, MD;  Location: [facility];  Service: Gastroenterology;  Laterality: N/A;  . COLONOSCOPY N/A 05/31/2017   Procedure: COLONOSCOPY;  Surgeon: Milus Banister, MD;  Location: [facility];  Service: Endoscopy;  Laterality: N/A;  . COLOSTOMY TAKEDOWN N/A 08/20/2017   Procedure: LAPAROSCOPIC ASSISTED REVISION END COLOSTOMY AND ENTEROLYSIS OF ADHESIONS;  Surgeon: Alphonsa Overall, MD;  Location: [facility];  Service: General;  Laterality: N/A;  ERAS PATHWAY  . ESOPHAGOGASTRODUODENOSCOPY N/A 05/31/2017   Procedure: ESOPHAGOGASTRODUODENOSCOPY (EGD);  Surgeon: Milus Banister, MD;  Location: [facility];  Service: Endoscopy;  Laterality: N/A;  . gamma knife radiation surgery  05/2010   for pituitary  . IR RADIOLOGIST EVAL & MGMT  02/12/2017  . IR RADIOLOGIST EVAL & MGMT  03/17/2017  . IR RADIOLOGIST  EVAL & MGMT  04/14/2017  . IR RADIOLOGIST EVAL & MGMT  02/05/2017  . PITUITARY SURGERY  2007   nose approach  . ROBOT ASSISTED LAPAROSCOPIC NEPHRECTOMY  04/23/2012   Procedure: ROBOTIC ASSISTED LAPAROSCOPIC NEPHRECTOMY;  Surgeon: Alexis Frock, MD;  Location: [facility];  Service: Urology;  Laterality: Left;  radical  . stomach peg  02/1993   removed 5-6 years later  . TRACHEOSTOMY TUBE PLACEMENT   02/1993   removed 04/1993  . UMBILICAL HERNIA REPAIR  04/23/2012   Procedure: HERNIA REPAIR UMBILICAL ADULT;  Surgeon: Alexis Frock, MD;  Location: [facility];  Service: Urology;;  . [name]  . vocal cord surgery     injected with collagen, then fat from stomach to improve speech s/p stroke    reports that he quit smoking about 25 years ago. His smoking use included cigarettes. He has a 11.50 pack-year smoking history. he has never used smokeless tobacco. He reports that he does not drink alcohol or use drugs. family history includes Alcohol abuse in his father; Arthritis in his maternal grandmother and mother; Colon cancer in his brother; Esophageal cancer (age of onset: 63) in his father; Hyperlipidemia in his mother; Hypertension in his brother; Stroke in his paternal grandmother; Throat cancer in his father; Uterine cancer (age of onset: 24) in his mother. Allergies  Allergen Reactions  . Penicillins Rash and Other (See Comments)    Has patient had a PCN reaction causing immediate rash, facial/tongue/throat swelling, SOB or lightheadedness with hypotension: YES Has patient had a PCN reaction causing severe rash involving mucus membranes or skin necrosis: No Has patient had a PCN reaction that required hospitalization No Has patient had a PCN reaction occurring within the last 10 years: Yes  If all of the above answers are "NO", then may proceed with Cephalosporin use.     Review of Systems  Constitutional: Positive for fatigue.  HENT: Negative for sore throat.   Respiratory: Negative for cough and shortness of breath.   Cardiovascular: Negative for chest pain.  Gastrointestinal: Negative for abdominal pain, diarrhea, nausea and vomiting.  Genitourinary: Positive for dysuria. Negative for hematuria.  Skin: Negative for rash.       Objective:   Physical Exam  Constitutional: He is oriented to person, place, and time. He appears well-developed and well-nourished.   Cardiovascular: Normal rate and regular rhythm.  Pulmonary/Chest: Effort normal and breath sounds normal. No respiratory distress. He has no wheezes. He has no rales.  Abdominal: Soft. There is no tenderness.  Musculoskeletal:  Trace edema lower legs bilaterally which is chronic and stable  Neurological: He is alert and oriented to person, place, and time.  Psychiatric: He has a normal mood and affect.       Assessment:     #1 nonspecific fatigue and weakness. Question multifactorial. He has history of some recent iron deficiency but hemoglobin stable following recent iron transfusion. Difficult to sort out how much of this may be related to his metastatic renal cell carcinoma.  #2 low testosterone  #3 history of diabetes with recent A1c 5.3% in January off medications  #4 possible recurrent UTI. High risk for colonization but is recently had some questionable low-grade fever and changes in appearance of urine    Plan:     -Urine culture sent -Start Cipro 500 mg twice a day for 5 days pending culture results. We are trying to balance minimizing antibiotics with his history of C. difficile with potential for more complicated infection -  Recheck total testosterone level. We did not check TSH or chemistries or CBC since it wheezes were recently done -Routine follow-up in 3 months and sooner as needed  [name] MD [facility] at [facility]

## 2017-10-15 LAB — URINE CULTURE
MICRO NUMBER: 90275285
SPECIMEN QUALITY: ADEQUATE

## 2017-10-15 MED ORDER — DOXYCYCLINE HYCLATE 100 MG PO TABS: 100.0000 mg | ORAL_TABLET | Freq: Two times a day (BID) | ORAL | 0 refills | Status: DC

## 2017-10-15 NOTE — Addendum Note (Signed)
Addended by: Agnes Lawrence on: 10/15/2017 06:07 PM   Modules accepted: Orders

## 2017-10-22 DIAGNOSIS — Z933 Colostomy status: Secondary | ICD-10-CM | POA: Diagnosis not present

## 2017-10-23 ENCOUNTER — Other Ambulatory Visit: Payer: Self-pay

## 2017-10-23 NOTE — Patient Outreach (Signed)
[facility]) Care Management  [place]  10/23/2017   Austin [name] 09-04-1955 [id number]  Subjective: Telephone call to patient for case closure call.  Spoke with wife who is his caregiver.  She is able to verify HIPAA.  She states that he is doing good. Patient appetite is good.  She reports that patient continues to work with their therapist friend for strengthening.  She did mention he has a bladder infection but has finished antibiotics and is ok.  She states stoma site is healed and measure 1 3/8 inches.  Discussed with her this being final call as patient has completed post-op and no issues or problems.  She verbalized understanding.   Objective:   Encounter Medications:  Outpatient Encounter Medications as of 10/23/2017  Medication Sig Note  . acetaminophen (TYLENOL) 500 MG tablet Take 1 tablet (500 mg total) by mouth every 6 (six) hours as needed for mild pain, moderate pain, fever or headache.   . albuterol (PROVENTIL HFA;VENTOLIN HFA) 108 (90 BASE) MCG/ACT inhaler Inhale 2 puffs into the lungs every 6 (six) hours as needed for wheezing or shortness of breath.   Marland Kitchen atorvastatin (LIPITOR) 20 MG tablet TAKE 1 TABLET BY MOUTH ONCE DAILY IN THE MORNING   . cetirizine (ZYRTEC) 10 MG tablet Take 10 mg by mouth daily with breakfast.   . escitalopram (LEXAPRO) 10 MG tablet TAKE 1 TABLET BY MOUTH ONCE DAILY IN THE MORNING   . furosemide (LASIX) 20 MG tablet Take 1 tablet (20 mg total) by mouth daily. (Patient taking differently: Take 20 mg by mouth daily as needed for fluid. )   . ketoconazole (NIZORAL) 2 % cream APPLY AS NEEDED TO RASH (Patient taking differently: Apply to groin or legs daily as needed for rash)   . Melatonin 3 MG TABS Take 3 mg by mouth at bedtime as needed (for sleep).   . Multiple Vitamin (MULTIVITAMIN WITH MINERALS) TABS tablet Take 1 tablet by mouth daily with breakfast.  08/17/2017: Not taking for surgery  . pantoprazole (PROTONIX) 40 MG tablet  Take 1 tablet (40 mg total) by mouth daily.   Marland Kitchen testosterone enanthate (DELATESTRYL) 200 MG/ML injection INJECT 1ML INTO THE MUSCLE EVERY 2 WEEKS (Patient taking differently: INJECT 200 MG INTO THE MUSCLE EVERY 2 WEEKS)   . traMADol (ULTRAM) 50 MG tablet Take 1 tablet (50 mg total) by mouth every 6 (six) hours as needed. (Patient taking differently: Take 50 mg by mouth every 6 (six) hours as needed for moderate pain. )   . traZODone (DESYREL) 100 MG tablet TAKE 1 TABLET BY MOUTH AT BEDTIME   . triamcinolone cream (KENALOG) 0.1 % Apply 1 application topically 2 (two) times daily as needed (for rash). Applies to face   . doxycycline (VIBRA-TABS) 100 MG tablet Take 1 tablet (100 mg total) by mouth 2 (two) times daily. (Patient not taking: Reported on 10/23/2017)    No facility-administered encounter medications on file as of 10/23/2017.     Functional Status:  In your present state of health, do you have any difficulty performing the following activities: 09/21/2017 08/25/2017  Hearing? N N  Vision? N N  Difficulty concentrating or making decisions? N N  Walking or climbing stairs? Y Y  Comment - -  Dressing or bathing? Y Y  Comment - -  Doing errands, shopping? - [employer] and eating ? - Y  Using the Toilet? - Y  Do you have problems with loss of bowel  control? - N  Managing your Medications? - Y  Managing your Finances? - Y  Housekeeping or managing your Housekeeping? - Y  Some recent data might be hidden    Fall/Depression Screening: Fall Risk  08/25/2017 06/19/2017 06/03/2017  Falls in the past year? No No No  Risk for fall due to : - - -  Risk for fall due to: Comment - - -   PHQ 2/9 Scores 08/25/2017 06/19/2017 06/03/2017 02/19/2017 05/25/2014  PHQ - 2 Score 0 0 0 0 0    Assessment: Patient has met goals of care and is ready for case closure.  Plan:  [facility] CM Care Plan Problem One     Most Recent Value  Care Plan Problem One  at risk for readmission  Role Documenting the  Problem One  Care Management Telephonic [place] for Problem One  Active  [facility] Long Term Goal   Patient will not be readmitted within the next 31 days.  [facility] Long Term Goal Start Date  08/25/17  [facility] Long Term Goal Met Date  09/28/17  Interventions for Problem One Long Term Goal  Patient has done well with new stoma site and continues to strengthen daily.   [facility] CM Short Term Goal #1   Patient will maintain patency of ostomy site within the next 30 days.  [facility] CM Short Term Goal #1 Start Date  09/28/17  [facility] CM Short Term Goal #1 Met Date  10/23/17  Interventions for Short Term Goal #1  Stoma site healed and no concerns.    [facility] CM Short Term Goal #2   Patient/ caregiver be able to report signs of infection to surgical site within 30 days.  [facility] CM Short Term Goal #2 Start Date  08/25/17  [facility] CM Short Term Goal #2 Met Date  09/23/17  Interventions for Short Term Goal #2  no signs of infection.       RN CM will close case and notify care management assistant.   [name], RN, MSN [facility] Care Management Care Management Coordinator Direct Line [phone] Toll Free: [phone]  Fax: [phone]

## 2017-11-12 ENCOUNTER — Inpatient Hospital Stay: Payer: PPO | Attending: Oncology | Admitting: Oncology

## 2017-11-12 ENCOUNTER — Inpatient Hospital Stay: Payer: PPO

## 2017-11-12 VITALS — BP 155/68 | HR 73 | Temp 98.2°F | Resp 24 | Ht 72.0 in

## 2017-11-12 DIAGNOSIS — Z905 Acquired absence of kidney: Secondary | ICD-10-CM | POA: Diagnosis not present

## 2017-11-12 DIAGNOSIS — Z8673 Personal history of transient ischemic attack (TIA), and cerebral infarction without residual deficits: Secondary | ICD-10-CM

## 2017-11-12 DIAGNOSIS — Z79899 Other long term (current) drug therapy: Secondary | ICD-10-CM | POA: Insufficient documentation

## 2017-11-12 DIAGNOSIS — C786 Secondary malignant neoplasm of retroperitoneum and peritoneum: Secondary | ICD-10-CM | POA: Diagnosis not present

## 2017-11-12 DIAGNOSIS — C649 Malignant neoplasm of unspecified kidney, except renal pelvis: Secondary | ICD-10-CM | POA: Diagnosis not present

## 2017-11-12 DIAGNOSIS — G8250 Quadriplegia, unspecified: Secondary | ICD-10-CM | POA: Insufficient documentation

## 2017-11-12 DIAGNOSIS — D509 Iron deficiency anemia, unspecified: Secondary | ICD-10-CM

## 2017-11-12 LAB — COMPREHENSIVE METABOLIC PANEL
ALBUMIN: 3.2 g/dL — AB (ref 3.5–5.0)
ALK PHOS: 95 U/L (ref 40–150)
ALT: 13 U/L (ref 0–55)
ANION GAP: 8 (ref 3–11)
AST: 14 U/L (ref 5–34)
BUN: 22 mg/dL (ref 7–26)
CALCIUM: 9.4 mg/dL (ref 8.4–10.4)
CO2: 23 mmol/L (ref 22–29)
Chloride: 107 mmol/L (ref 98–109)
Creatinine, Ser: 1.27 mg/dL (ref 0.70–1.30)
GFR calc Af Amer: >60 mL/min (ref >60)
GFR calc non Af Amer: 58 mL/min — ABNORMAL LOW (ref >60)
GLUCOSE: 94 mg/dL (ref 70–140)
POTASSIUM: 4.3 mmol/L (ref 3.5–5.1)
SODIUM: 138 mmol/L (ref 136–145)
Total Bilirubin: 0.4 mg/dL (ref 0.2–1.2)
Total Protein: 7.7 g/dL (ref 6.4–8.3)

## 2017-11-12 LAB — CBC WITH DIFFERENTIAL/PLATELET
Basophils Absolute: 0.1 10*3/uL (ref 0.0–0.1)
Basophils Relative: 1 %
Eosinophils Absolute: 0.4 10*3/uL (ref 0.0–0.5)
Eosinophils Relative: 4 %
HEMATOCRIT: 40.9 % (ref 38.4–49.9)
Hemoglobin: 13.2 g/dL (ref 13.0–17.1)
LYMPHS PCT: 13 %
Lymphs Abs: 1.2 10*3/uL (ref 0.9–3.3)
MCH: 28.4 pg (ref 27.2–33.4)
MCHC: 32.3 g/dL (ref 32.0–36.0)
MCV: 88.1 fL (ref 79.3–98.0)
MONO ABS: 1.1 10*3/uL — AB (ref 0.1–0.9)
MONOS PCT: 12 %
NEUTROS ABS: 6.3 10*3/uL (ref 1.5–6.5)
Neutrophils Relative %: 70 %
Platelets: 176 10*3/uL (ref 140–400)
RBC: 4.64 MIL/uL (ref 4.20–5.82)
RDW: 16.7 % — AB (ref 11.0–14.6)
WBC: 9 10*3/uL (ref 4.0–10.3)

## 2017-11-12 NOTE — Progress Notes (Signed)
Hematology and Oncology Follow Up Visit  Austin [name] [id number] March 18, 1956 62 y.o. 11/12/2017 10:09 AM Austin [name], Austin [name], MDBurchette, Austin Sierras, MD   Principle Diagnosis: 62 year old man with:  1.  Metastatic renal cell carcinoma with peritoneal involvement documented in 2015.  He was initially diagnosed in 2013 after a laparoscopic nephrectomy.  2. History of brainstem stroke with quadriplegia.  3.  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  He had a colostomy revision in January 2019.   Prior Therapy:   1. He is status post robotic-assisted laparoscopic nephrectomy on the left done on 04/23/2012. The pathology revealed renal cell carcinoma with a grade 3/4 with the pathological staging of T3a.   2. Votrient 800 mg daily started around 12/27/2013. This was reduced to 400 mg subsequently and have been discontinued since 02/03/2014 due to poor tolerance.  Current therapy: Active surveillance.  Interim History: Austin [name] is here for a follow-up.  He reports feeling well since the last visit.  He tolerated his colostomy revision reasonably well and has felt much better since.  He denies any abdominal pain, distention or early satiety.  No further issues with his colostomy noted since the revision.  He denies any diarrhea or abdominal distention.  He denies any weight loss.  His appetite is much improved and his energy has also improved.  He continues to receive occupational therapy for his upper body for increased strength.  He had any headaches, blurry vision, or seizures. He does not report any fevers or chills or sweats.  His appetite is reasonable and weight is stable. has not reported chest pain or difficulty breathing. Has not reported any nausea, vomiting or early satiety. Has not reported any hematochezia or melena. He has not reported any arthralgias or myalgias.  He denied any lymphadenopathy or petechiae. Rest of review of systems  is negative.  Medications: I have reviewed the patient's current medications.  Current Outpatient Medications  Medication Sig Dispense Refill  . acetaminophen (TYLENOL) 500 MG tablet Take 1 tablet (500 mg total) by mouth every 6 (six) hours as needed for mild pain, moderate pain, fever or headache. 30 tablet 0  . albuterol (PROVENTIL HFA;VENTOLIN HFA) 108 (90 BASE) MCG/ACT inhaler Inhale 2 puffs into the lungs every 6 (six) hours as needed for wheezing or shortness of breath. 1 Inhaler 0  . atorvastatin (LIPITOR) 20 MG tablet TAKE 1 TABLET BY MOUTH ONCE DAILY IN THE MORNING 90 tablet 1  . cetirizine (ZYRTEC) 10 MG tablet Take 10 mg by mouth daily with breakfast.    . doxycycline (VIBRA-TABS) 100 MG tablet Take 1 tablet (100 mg total) by mouth 2 (two) times daily. (Patient not taking: Reported on 10/23/2017) 14 tablet 0  . escitalopram (LEXAPRO) 10 MG tablet TAKE 1 TABLET BY MOUTH ONCE DAILY IN THE MORNING 90 tablet 1  . furosemide (LASIX) 20 MG tablet Take 1 tablet (20 mg total) by mouth daily. (Patient taking differently: Take 20 mg by mouth daily as needed for fluid. ) 30 tablet 0  . ketoconazole (NIZORAL) 2 % cream APPLY AS NEEDED TO RASH (Patient taking differently: Apply to groin or legs daily as needed for rash) 60 g 1  . Melatonin 3 MG TABS Take 3 mg by mouth at bedtime as needed (for sleep).    . Multiple Vitamin (MULTIVITAMIN WITH MINERALS) TABS tablet Take 1 tablet by mouth daily with breakfast.     . pantoprazole (PROTONIX) 40 MG tablet Take  1 tablet (40 mg total) by mouth daily. 30 tablet 4  . testosterone enanthate (DELATESTRYL) 200 MG/ML injection INJECT 1ML INTO THE MUSCLE EVERY 2 WEEKS (Patient taking differently: INJECT 200 MG INTO THE MUSCLE EVERY 2 WEEKS) 5 mL 5  . traMADol (ULTRAM) 50 MG tablet Take 1 tablet (50 mg total) by mouth every 6 (six) hours as needed. (Patient taking differently: Take 50 mg by mouth every 6 (six) hours as needed for moderate pain. ) 60 tablet 0  .  traZODone (DESYREL) 100 MG tablet TAKE 1 TABLET BY MOUTH AT BEDTIME 90 tablet 1  . triamcinolone cream (KENALOG) 0.1 % Apply 1 application topically 2 (two) times daily as needed (for rash). Applies to face     No current facility-administered medications for this visit.      Allergies:  Allergies  Allergen Reactions  . Penicillins Rash and Other (See Comments)    Has patient had a PCN reaction causing immediate rash, facial/tongue/throat swelling, SOB or lightheadedness with hypotension: YES Has patient had a PCN reaction causing severe rash involving mucus membranes or skin necrosis: No Has patient had a PCN reaction that required hospitalization No Has patient had a PCN reaction occurring within the last 10 years: Yes  If all of the above answers are "NO", then may proceed with Cephalosporin use.    Past Medical History, Surgical history, Social history, and Family History were reviewed and updated.    Physical Exam: Blood pressure (!) 155/68, pulse 73, temperature 98.2 F (36.8 C), temperature source Oral, resp. rate (!) 24, height 6' (1.829 m), SpO2 97 %.   ECOG: 2 General appearance: Well-appearing gentleman appeared comfortable in a motorized wheelchair. Head: Atraumatic without abnormalities. Eyes: No scleral icterus. Oropharynx: Without thrush or ulcers. Lymph nodes: Cervical, supraclavicular, and axillary nodes appear not enlarged. Heart: Regular rate and rhythm without any murmurs or gallops. Lung: Clear without any rhonchi, wheezes or dullness to percussion. Abdomin: Soft, nontender without any rebound or guarding. Musculoskeletal: No joint deformity or effusion.  Limited range of motion noted in his shoulder and elbow right more than left. Skin: No rashes or lesions.   Lab Results: Lab Results  Component Value Date   WBC 7.6 09/11/2017   HGB 11.9 (L) 09/11/2017   HCT 36.8 (L) 09/11/2017   MCV 83.9 09/11/2017   PLT 254.0 09/11/2017     Chemistry       Component Value Date/Time   NA 137 08/24/2017 0430   NA 138 03/13/2017 0922   K 4.2 08/24/2017 0430   K 4.6 03/13/2017 0922   CL 109 08/24/2017 0430   CO2 24 08/24/2017 0430   CO2 24 03/13/2017 0922   BUN 9 08/24/2017 0430   BUN 27.4 (H) 03/13/2017 0922   CREATININE 0.98 08/24/2017 0430   CREATININE 0.87 06/05/2017 1643   CREATININE 1.3 03/13/2017 0922      Component Value Date/Time   CALCIUM 8.2 (L) 08/24/2017 0430   CALCIUM 9.3 03/13/2017 0922   ALKPHOS 67 06/15/2017 1414   ALKPHOS 91 03/13/2017 0922   AST 25 06/15/2017 1414   AST 17 03/13/2017 0922   ALT 16 (L) 06/15/2017 1414   ALT 21 03/13/2017 0922   BILITOT 0.9 06/15/2017 1414   BILITOT 0.27 03/13/2017 0922         Impression and Plan:  62 year old man with:  1.  Stage IV renal cell carcinoma with metastatic disease to the peritoneum.  He was initially diagnosed in 2013 and developed advanced disease  in 2015.  He has been on active surveillance only after declining systemic therapy.  His disease has progressed very slowly in the last 4 years and has not required any intervention.  Imaging studies from October 2018 showed no dramatic changes.  The natural course of this disease was reviewed today including the indolent nature at this time.  He continues to decline active therapy which is a reasonable decision at this time.  Although he is interested in active surveillance.  The plan is to repeat imaging studies in 6 months for surveillance purposes.  This can be done sooner if he develops any symptoms.  2.  Recurrent diverticulitis: He is status post diverting colostomy and revision done in January 2019.  This is improved at this time without any new complications.  3.  Iron deficiency anemia: Iron studies in February 2019 has improved currently at 36 and ferritin of 37.  Hemoglobin today is back to normal range.  4. Prognosis: He does have an incurable malignancy that has progressed rather slowly.  5.  Followup: In 6 months.  15  minutes was spent with the patient face-to-face today.  More than 50% of time was dedicated to patient counseling, education and coordination of his care.   Zola Button, MD 4/4/201910:09 AM

## 2017-11-18 ENCOUNTER — Other Ambulatory Visit (INDEPENDENT_AMBULATORY_CARE_PROVIDER_SITE_OTHER): Payer: PPO

## 2017-11-18 ENCOUNTER — Other Ambulatory Visit: Payer: Self-pay | Admitting: *Deleted

## 2017-11-18 DIAGNOSIS — R300 Dysuria: Secondary | ICD-10-CM | POA: Diagnosis not present

## 2017-11-18 LAB — POC URINALSYSI DIPSTICK (AUTOMATED)
BILIRUBIN UA: NEGATIVE
GLUCOSE UA: NEGATIVE
KETONES UA: NEGATIVE
Nitrite, UA: POSITIVE
SPEC GRAV UA: 1.025 (ref 1.010–1.025)
Urobilinogen, UA: 0.2 E.U./dL
pH, UA: 6 (ref 5.0–8.0)

## 2017-11-18 MED ORDER — SULFAMETHOXAZOLE-TRIMETHOPRIM 800-160 MG PO TABS: 1.0000 | ORAL_TABLET | Freq: Two times a day (BID) | ORAL | 0 refills | Status: DC

## 2017-11-19 LAB — URINE CULTURE
MICRO NUMBER:: 90442518
SPECIMEN QUALITY:: ADEQUATE

## 2017-12-01 DIAGNOSIS — H524 Presbyopia: Secondary | ICD-10-CM | POA: Diagnosis not present

## 2017-12-01 DIAGNOSIS — H2513 Age-related nuclear cataract, bilateral: Secondary | ICD-10-CM | POA: Diagnosis not present

## 2017-12-01 DIAGNOSIS — H52223 Regular astigmatism, bilateral: Secondary | ICD-10-CM | POA: Diagnosis not present

## 2017-12-01 DIAGNOSIS — Z961 Presence of intraocular lens: Secondary | ICD-10-CM | POA: Diagnosis not present

## 2017-12-07 ENCOUNTER — Other Ambulatory Visit: Payer: Self-pay | Admitting: Family Medicine

## 2017-12-21 ENCOUNTER — Other Ambulatory Visit (INDEPENDENT_AMBULATORY_CARE_PROVIDER_SITE_OTHER): Payer: PPO

## 2017-12-21 DIAGNOSIS — D509 Iron deficiency anemia, unspecified: Secondary | ICD-10-CM

## 2017-12-21 LAB — CBC WITH DIFFERENTIAL/PLATELET
BASOS ABS: 0.1 10*3/uL (ref 0.0–0.1)
BASOS PCT: 0.9 % (ref 0.0–3.0)
EOS PCT: 2 % (ref 0.0–5.0)
Eosinophils Absolute: 0.2 10*3/uL (ref 0.0–0.7)
HEMATOCRIT: 39.9 % (ref 39.0–52.0)
Hemoglobin: 13.2 g/dL (ref 13.0–17.0)
LYMPHS PCT: 9.1 % — AB (ref 12.0–46.0)
Lymphs Abs: 1.1 10*3/uL (ref 0.7–4.0)
MCHC: 33 g/dL (ref 30.0–36.0)
MCV: 87.2 fl (ref 78.0–100.0)
MONOS PCT: 10.6 % (ref 3.0–12.0)
Monocytes Absolute: 1.3 10*3/uL — ABNORMAL HIGH (ref 0.1–1.0)
NEUTROS ABS: 9.2 10*3/uL — AB (ref 1.4–7.7)
Neutrophils Relative %: 77.4 % — ABNORMAL HIGH (ref 43.0–77.0)
PLATELETS: 135 10*3/uL — AB (ref 150.0–400.0)
RBC: 4.58 Mil/uL (ref 4.22–5.81)
RDW: 17.9 % — AB (ref 11.5–15.5)
WBC: 11.9 10*3/uL — ABNORMAL HIGH (ref 4.0–10.5)

## 2017-12-21 LAB — IBC PANEL
IRON: 35 ug/dL — AB (ref 42–165)
Saturation Ratios: 14.8 % — ABNORMAL LOW (ref 20.0–50.0)
TRANSFERRIN: 169 mg/dL — AB (ref 212.0–360.0)

## 2017-12-21 LAB — FERRITIN: Ferritin: 60.4 ng/mL (ref 22.0–322.0)

## 2018-01-12 ENCOUNTER — Ambulatory Visit (INDEPENDENT_AMBULATORY_CARE_PROVIDER_SITE_OTHER): Payer: PPO | Admitting: Family Medicine

## 2018-01-12 ENCOUNTER — Encounter: Payer: Self-pay | Admitting: Family Medicine

## 2018-01-12 VITALS — BP 128/70 | HR 64 | Temp 97.6°F | Wt 170.0 lb

## 2018-01-12 DIAGNOSIS — R2232 Localized swelling, mass and lump, left upper limb: Secondary | ICD-10-CM | POA: Diagnosis not present

## 2018-01-12 DIAGNOSIS — R5383 Other fatigue: Secondary | ICD-10-CM | POA: Diagnosis not present

## 2018-01-12 DIAGNOSIS — R7989 Other specified abnormal findings of blood chemistry: Secondary | ICD-10-CM

## 2018-01-12 DIAGNOSIS — N3090 Cystitis, unspecified without hematuria: Secondary | ICD-10-CM

## 2018-01-12 DIAGNOSIS — E119 Type 2 diabetes mellitus without complications: Secondary | ICD-10-CM

## 2018-01-12 LAB — POCT URINALYSIS DIPSTICK
Bilirubin, UA: NEGATIVE
Glucose, UA: NEGATIVE
Ketones, UA: NEGATIVE
Nitrite, UA: NEGATIVE
Protein, UA: POSITIVE — AB
Spec Grav, UA: 1.025
Urobilinogen, UA: 0.2 U/dL
pH, UA: 6

## 2018-01-12 LAB — POCT GLYCOSYLATED HEMOGLOBIN (HGB A1C): Hemoglobin A1C: 5.9 % — AB (ref 4.0–5.6)

## 2018-01-12 LAB — TESTOSTERONE: TESTOSTERONE: 846.58 ng/dL (ref 300.00–890.00)

## 2018-01-12 LAB — TSH: TSH: 5.31 u[IU]/mL — ABNORMAL HIGH (ref 0.35–4.50)

## 2018-01-12 MED ORDER — CIPROFLOXACIN HCL 500 MG PO TABS: 500.0000 mg | ORAL_TABLET | Freq: Two times a day (BID) | ORAL | 0 refills | Status: DC

## 2018-01-12 NOTE — Progress Notes (Signed)
Subjective:     Patient ID: Austin [name], male   DOB: [birth year], 62 y.o.   MRN: [id number]  HPI   Seen for medical follow-up. 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 He is trying to do some upper body exercises through occupational therapy. His major complaint though is excessive fatigue. He had recent testosterone level in March which was very low at 107. They've been taking 200 mg intramuscular every 2 weeks. He's had pituitary tumor in the past but no history of hypothyroidism. He had normal TSH year ago. Recent hemoglobin stable.  He recently noticed a firm subcutaneous nodular lesion left arm medially. Has had some intermittent left hip pain. No fall or injury. Pain is more the groin region.  He has history of recurrent UTIs in the past. Very high-risk for colonization. They've no some recent cloudy urine. No fever.  Past Medical History:  Diagnosis Date  . Brainstem stroke ([place]) 10/16/2008   Qualifier: Diagnosis of  By: [name] LPN, [name]    . C. difficile colitis 02/08/2017  . Cerebrovascular accident ([place]) 1994   hx of brainstem stroke, residual diminished lung capacity  . Chronic kidney disease    Left Renal Mass  . 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 Diverticulitis   . Duodenal ulcer   . GIB (gastrointestinal bleeding) 05/28/2017  . Hx of colonic polyps   . Hypertension    has been off BP meds since GI bleed was thought to have been caused by previously prescribed lisinopril   . Hypogonadism   . Intra-abdominal abscess ([place]) 01/23/2017  . Iron deficiency anemia   . 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 Neuromuscular disorder ([place])    quadraplegic  . Open abdominal incision with drainage 01/26/2017  . Pituitary adenoma ([place])   . 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 Pneumonia   . PONV (postoperative nausea and vomiting)    vomited after colostomy placement   . Pre-diabetes    last a1c 5.6   . 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[place])   . Urinary incontinence   . Venous stasis    edema   Past Surgical History:  Procedure Laterality Date  . CHOLECYSTECTOMY    . COLON RESECTION N/A 05/04/2017   Procedure: LAPAROSCOPIC SIGMOID COLON RESECTION WITH END COLSOTOMY ERAS PATHWAY;  Surgeon: Alphonsa Overall, MD;  [facility];  Service: General;  Laterality: N/A;  . COLONOSCOPY  02/27/2012   Procedure: COLONOSCOPY;  Surgeon: Jerene Bears, MD;  [facility];  Service: Gastroenterology;  Laterality: N/A;  . COLONOSCOPY N/A 05/31/2017   Procedure: COLONOSCOPY;  Surgeon: Milus Banister, MD;  [facility];  Service: Endoscopy;  Laterality: N/A;  . COLOSTOMY TAKEDOWN N/A 08/20/2017   Procedure: LAPAROSCOPIC ASSISTED REVISION END COLOSTOMY AND ENTEROLYSIS OF ADHESIONS;  Surgeon: Alphonsa Overall, MD;  [facility];  Service: General;  Laterality: N/A;  ERAS PATHWAY  . ESOPHAGOGASTRODUODENOSCOPY N/A 05/31/2017   Procedure: ESOPHAGOGASTRODUODENOSCOPY (EGD);  Surgeon: Milus Banister, MD;  Location[facility];  Service: Endoscopy;  Laterality: N/A;  . gamma knife radiation surgery  05/2010   for pituitary  . IR RADIOLOGIST EVAL & MGMT  02/12/2017  . IR RADIOLOGIST EVAL & MGMT  03/17/2017  . IR RADIOLOGIST EVAL & MGMT  04/14/2017  . IR RADIOLOGIST EVAL & MGMT  02/05/2017  . PITUITARY SURGERY  2007   nose approach  . ROBOT ASSISTED LAPAROSCOPIC NEPHRECTOMY  04/23/2012   Procedure: ROBOTIC ASSISTED LAPAROSCOPIC NEPHRECTOMY;  Surgeon: Alexis Frock, MD;  [facility];  Service: Urology;  Laterality: Left;  radical  . stomach peg  02/1993   removed 5-6 years later  . TRACHEOSTOMY TUBE PLACEMENT   02/1993   removed 04/1993  . UMBILICAL HERNIA REPAIR  04/23/2012   Procedure: HERNIA REPAIR UMBILICAL ADULT;  Surgeon: Alexis Frock, MD;  [facility];  Service: Urology;;  . [name]  . vocal cord surgery     injected with collagen, then fat from stomach to improve speech s/p stroke    reports that he quit smoking about 25 years ago. His smoking use included cigarettes. He has a 11.50 pack-year smoking history. He has never used smokeless tobacco. He reports that he does not drink alcohol or use drugs. family history includes Alcohol abuse in his father; Arthritis in his maternal grandmother and mother; Colon cancer in his brother; Esophageal cancer (age of onset: 56) in his father; Hyperlipidemia in his mother; Hypertension in his brother; Stroke in his paternal grandmother; Throat cancer in his father; Uterine cancer (age of onset: 12) in his mother. Allergies  Allergen Reactions  . Penicillins Rash and Other (See Comments)    Has patient had a PCN reaction causing immediate rash, facial/tongue/throat swelling, SOB or lightheadedness with hypotension: YES Has patient had a PCN reaction causing severe rash involving mucus membranes or skin necrosis: No Has patient had a PCN reaction that required hospitalization No Has patient had a PCN reaction occurring within the last 10 years: Yes  If all of the above answers are "NO", then may proceed with Cephalosporin use.     Review of Systems  Constitutional: Positive for fatigue. Negative for appetite change, chills, fever and unexpected weight change.  Respiratory: Negative for cough and shortness of breath.   Cardiovascular: Negative for chest pain.  Gastrointestinal: Negative for abdominal pain.  Neurological: Negative for headaches.       Objective:   Physical Exam  Constitutional: He appears well-developed and well-nourished.  Cardiovascular: Normal rate and regular rhythm.  Pulmonary/Chest: Effort normal and breath  sounds normal. He has no wheezes. He has no rales.  Musculoskeletal: He exhibits no edema.  Skin:  Left upper arm approximately 1-1/2 a centimeter mobile firm subcutaneous cystic/nodular type lesion       Assessment:     #1 dysuria. Rule out recurrent UTI.  #2 fatigue. Suspect multifactorial  #3 low testosterone  #4 left arm subcutaneous nodular lesion. Doubt lipoma-?cyst.  #5 type 2 diabetes well controlled A1c 5.9%    Plan:     -Check further labs including TSH and total testosterone level and adjust testosterone if indicated -Regarding left arm lesion follow-up promptly for any rapid growth or other concerns. -Urine culture sent -Start Cipro 500 mg twice a day pending culture results  [name] MD [facility] at [facility]

## 2018-01-14 LAB — URINE CULTURE
MICRO NUMBER: 90670813
SPECIMEN QUALITY:: ADEQUATE

## 2018-01-27 DIAGNOSIS — Z933 Colostomy status: Secondary | ICD-10-CM | POA: Diagnosis not present

## 2018-02-01 ENCOUNTER — Telehealth: Payer: Self-pay | Admitting: Family Medicine

## 2018-02-01 DIAGNOSIS — R300 Dysuria: Secondary | ICD-10-CM

## 2018-02-01 NOTE — Telephone Encounter (Signed)
Copied from [place] [id number]. Topic: Quick Communication - See Telephone Encounter >> Feb 01, 2018 11:36 AM Bea Graff, NT wrote: CRM for notification. See Telephone encounter for: 02/01/18. Pts wife calling and states husband has another bladder infection coming and on and she would like to see if Dr. [name] would like them to bring in a urine sample tomorrow or make an appt?

## 2018-02-01 NOTE — Telephone Encounter (Signed)
OK to bring in

## 2018-02-02 ENCOUNTER — Other Ambulatory Visit (INDEPENDENT_AMBULATORY_CARE_PROVIDER_SITE_OTHER): Payer: PPO

## 2018-02-02 DIAGNOSIS — R300 Dysuria: Secondary | ICD-10-CM | POA: Diagnosis not present

## 2018-02-02 LAB — POCT URINALYSIS DIPSTICK
Bilirubin, UA: NEGATIVE
GLUCOSE UA: NEGATIVE
Ketones, UA: NEGATIVE
ODOR: NEGATIVE
Protein, UA: POSITIVE — AB
Spec Grav, UA: 1.015 (ref 1.010–1.025)
Urobilinogen, UA: 0.2 E.U./dL
pH, UA: 6 (ref 5.0–8.0)

## 2018-02-02 NOTE — Telephone Encounter (Signed)
Called and spoke to patients wife. She said she would bring in the urine sample today around lunch time. Orders have been placed.

## 2018-02-03 ENCOUNTER — Other Ambulatory Visit: Payer: Self-pay | Admitting: *Deleted

## 2018-02-03 MED ORDER — CIPROFLOXACIN HCL 500 MG PO TABS: 500.0000 mg | ORAL_TABLET | Freq: Two times a day (BID) | ORAL | 0 refills | Status: DC

## 2018-02-26 ENCOUNTER — Ambulatory Visit (INDEPENDENT_AMBULATORY_CARE_PROVIDER_SITE_OTHER): Payer: PPO | Admitting: Family Medicine

## 2018-02-26 ENCOUNTER — Encounter: Payer: Self-pay | Admitting: Family Medicine

## 2018-02-26 VITALS — BP 152/80 | HR 72 | Temp 97.7°F

## 2018-02-26 DIAGNOSIS — F5104 Psychophysiologic insomnia: Secondary | ICD-10-CM

## 2018-02-26 DIAGNOSIS — R300 Dysuria: Secondary | ICD-10-CM

## 2018-02-26 DIAGNOSIS — M6289 Other specified disorders of muscle: Secondary | ICD-10-CM | POA: Diagnosis not present

## 2018-02-26 LAB — POCT URINALYSIS DIPSTICK
Bilirubin, UA: NEGATIVE
Glucose, UA: NEGATIVE
Ketones, UA: 0.5
NITRITE UA: NEGATIVE
PROTEIN UA: POSITIVE — AB
SPEC GRAV UA: 1.02 (ref 1.010–1.025)
Urobilinogen, UA: 0.2 E.U./dL
pH, UA: 5 (ref 5.0–8.0)

## 2018-02-26 MED ORDER — CIPROFLOXACIN HCL 500 MG PO TABS: 500.0000 mg | ORAL_TABLET | Freq: Two times a day (BID) | ORAL | 0 refills | Status: DC

## 2018-02-26 NOTE — Patient Instructions (Addendum)
Monitor blood pressure and be in touch if consistently > 140/90  HOLD the Lipitor for the next month to see if muscle fatigue improves.

## 2018-02-26 NOTE — Progress Notes (Signed)
Subjective:     Patient ID: Austin [name], male   DOB: [birth year], 63 y.o.   MRN: [id number]  HPI Patient is seen with concern for recurrent UTI. He had a long history of UTIs. He had some recent cloudy urine and increased fatigue. No fever. No gross hematuria. No flank pain. No nausea or vomiting.  He taken prophylaxis in the past years ago but has had issues with C. difficile in the past and we've tried to limit antibiotic use as much as possible.  Having some issues with insomnia. Currently on trazodone 100 mg daily at bedtime. Difficulty falling asleep  Increased muscle fatigue. Occasional muscle achiness. Testosterone level recently was normal. TSH was over 5. On statin with Lipitor.  Past Medical History:  Diagnosis Date  . Brainstem stroke ([place]) 10/16/2008   Qualifier: Diagnosis of  By: [name] LPN, [name]    . C. difficile colitis 02/08/2017  . Cerebrovascular accident ([place]) 1994   hx of brainstem stroke, residual diminished lung capacity  . Chronic kidney disease    Left Renal Mass  . 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 Diverticulitis   . Duodenal ulcer   . GIB (gastrointestinal bleeding) 05/28/2017  . Hx of colonic polyps   . Hypertension    has been off BP meds since GI bleed was thought to have been caused by previously prescribed lisinopril   . Hypogonadism   . Intra-abdominal abscess ([place]) 01/23/2017  . Iron deficiency anemia   . 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 Neuromuscular disorder ([place])    quadraplegic  . Open abdominal incision with drainage 01/26/2017  . Pituitary adenoma ([place])   . 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 Pneumonia   . PONV (postoperative nausea and vomiting)    vomited after colostomy placement   . Pre-diabetes    last a1c 5.6   . 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[place])   . Urinary incontinence   . Venous stasis    edema   Past Surgical History:  Procedure Laterality Date  .  CHOLECYSTECTOMY    . COLON RESECTION N/A 05/04/2017   Procedure: LAPAROSCOPIC SIGMOID COLON RESECTION WITH END COLSOTOMY ERAS PATHWAY;  Surgeon: Alphonsa Overall, MD;  [facility];  Service: General;  Laterality: N/A;  . COLONOSCOPY  02/27/2012   Procedure: COLONOSCOPY;  Surgeon: Jerene Bears, MD;  [facility];  Service: Gastroenterology;  Laterality: N/A;  . COLONOSCOPY N/A 05/31/2017   Procedure: COLONOSCOPY;  Surgeon: Milus Banister, MD;  [facility];  Service: Endoscopy;  Laterality: N/A;  . COLOSTOMY TAKEDOWN N/A 08/20/2017   Procedure: LAPAROSCOPIC ASSISTED REVISION END COLOSTOMY AND ENTEROLYSIS OF ADHESIONS;  Surgeon: Alphonsa Overall, MD;  [facility];  Service: General;  Laterality: N/A;  ERAS PATHWAY  . ESOPHAGOGASTRODUODENOSCOPY N/A 05/31/2017   Procedure: ESOPHAGOGASTRODUODENOSCOPY (EGD);  Surgeon: Milus Banister, MD;  [facility]: [facility];  Service: Endoscopy;  Laterality: N/A;  . gamma knife radiation surgery  05/2010   for pituitary  . IR RADIOLOGIST EVAL & MGMT  02/12/2017  . IR RADIOLOGIST EVAL & MGMT  03/17/2017  . IR RADIOLOGIST EVAL & MGMT  04/14/2017  . IR RADIOLOGIST EVAL & MGMT  02/05/2017  . PITUITARY SURGERY  2007   nose approach  . ROBOT ASSISTED LAPAROSCOPIC NEPHRECTOMY  04/23/2012   Procedure: ROBOTIC ASSISTED LAPAROSCOPIC NEPHRECTOMY;  Surgeon: Alexis Frock, MD;  [facility];  Service: Urology;  Laterality: Left;  radical  . stomach peg  02/1993   removed 5-6 years later  . TRACHEOSTOMY TUBE PLACEMENT  02/1993   removed 04/1993  . UMBILICAL HERNIA REPAIR  04/23/2012   Procedure: HERNIA REPAIR UMBILICAL ADULT;  Surgeon: Alexis Frock, MD;  [facility];  Service: Urology;;  . [name]  . vocal cord surgery     injected with collagen, then fat from stomach to improve speech s/p stroke    reports that he quit smoking about 25 years ago. His smoking use included cigarettes. He has a 11.50 pack-year smoking history. He has  never used smokeless tobacco. He reports that he does not drink alcohol or use drugs. family history includes Alcohol abuse in his father; Arthritis in his maternal grandmother and mother; Colon cancer in his brother; Esophageal cancer (age of onset: 65) in his father; Hyperlipidemia in his mother; Hypertension in his brother; Stroke in his paternal grandmother; Throat cancer in his father; Uterine cancer (age of onset: 54) in his mother. Allergies  Allergen Reactions  . Penicillins Rash and Other (See Comments)    Has patient had a PCN reaction causing immediate rash, facial/tongue/throat swelling, SOB or lightheadedness with hypotension: YES Has patient had a PCN reaction causing severe rash involving mucus membranes or skin necrosis: No Has patient had a PCN reaction that required hospitalization No Has patient had a PCN reaction occurring within the last 10 years: Yes  If all of the above answers are "NO", then may proceed with Cephalosporin use.     Review of Systems  Constitutional: Positive for fatigue. Negative for chills and fever.  Respiratory: Negative for shortness of breath.   Gastrointestinal: Negative for abdominal pain, nausea and vomiting.  Genitourinary: Positive for dysuria. Negative for flank pain and hematuria.  Musculoskeletal: Positive for myalgias.  Skin: Negative for rash.  Psychiatric/Behavioral: Positive for sleep disturbance. Negative for confusion.       Objective:   Physical Exam  Constitutional: He is oriented to person, place, and time. He appears well-developed and well-nourished.  Cardiovascular: Normal rate and regular rhythm.  Pulmonary/Chest: Effort normal and breath sounds normal. He has no wheezes. He has no rales.  Musculoskeletal: He exhibits no edema.  Neurological: He is alert and oriented to person, place, and time.       Assessment:     #1 dysuria. History of frequent UTI in the past. Urine dipstick reveals large leukocytes and  blood.  #2 chronic insomnia- on Trazodone.  #3 myalgias and muscle fatigue    Plan:     -Urine culture sent -Cipro 500 mg twice a day for 7 days -Try holding Lipitor for one month to see if this makes a difference in his muscle symptoms -Recommend follow-up labs including TSH, free T4, 25 hydroxy vitamin D at follow-up -He will try increasing Trazodone to one and one half tablets at night for insomnia.  If this is not helping they will let me know.  [name] MD [facility] at [facility]

## 2018-02-28 LAB — URINE CULTURE
MICRO NUMBER:: 90857911
SPECIMEN QUALITY:: ADEQUATE

## 2018-03-01 ENCOUNTER — Other Ambulatory Visit: Payer: Self-pay | Admitting: *Deleted

## 2018-03-01 MED ORDER — SULFAMETHOXAZOLE-TRIMETHOPRIM 800-160 MG PO TABS: 1.0000 | ORAL_TABLET | Freq: Two times a day (BID) | ORAL | 0 refills | Status: DC

## 2018-03-10 ENCOUNTER — Other Ambulatory Visit: Payer: Self-pay | Admitting: Family Medicine

## 2018-03-12 NOTE — Telephone Encounter (Signed)
Okay to refill? 

## 2018-03-12 NOTE — Telephone Encounter (Signed)
Refill OK

## 2018-03-15 ENCOUNTER — Other Ambulatory Visit: Payer: Self-pay | Admitting: Family Medicine

## 2018-03-15 NOTE — Telephone Encounter (Signed)
Rx done. 

## 2018-03-25 ENCOUNTER — Other Ambulatory Visit: Payer: Self-pay | Admitting: Family Medicine

## 2018-03-25 NOTE — Telephone Encounter (Signed)
Last refill on Trazodone given on 2/20 for #90 with 1 ref

## 2018-03-25 NOTE — Telephone Encounter (Signed)
Refill for 6 months. 

## 2018-03-25 NOTE — Telephone Encounter (Signed)
Rx done. 

## 2018-04-14 ENCOUNTER — Ambulatory Visit (INDEPENDENT_AMBULATORY_CARE_PROVIDER_SITE_OTHER): Payer: PPO | Admitting: Family Medicine

## 2018-04-14 DIAGNOSIS — E1169 Type 2 diabetes mellitus with other specified complication: Secondary | ICD-10-CM

## 2018-04-14 DIAGNOSIS — R7989 Other specified abnormal findings of blood chemistry: Secondary | ICD-10-CM | POA: Diagnosis not present

## 2018-04-14 DIAGNOSIS — R300 Dysuria: Secondary | ICD-10-CM

## 2018-04-14 DIAGNOSIS — I10 Essential (primary) hypertension: Secondary | ICD-10-CM | POA: Diagnosis not present

## 2018-04-14 DIAGNOSIS — Z23 Encounter for immunization: Secondary | ICD-10-CM

## 2018-04-14 DIAGNOSIS — E119 Type 2 diabetes mellitus without complications: Secondary | ICD-10-CM

## 2018-04-14 DIAGNOSIS — E785 Hyperlipidemia, unspecified: Secondary | ICD-10-CM

## 2018-04-14 LAB — LIPID PANEL
Cholesterol: 193 mg/dL (ref 0–200)
HDL: 31.5 mg/dL — ABNORMAL LOW (ref >39.00)
NonHDL: 161.52
Total CHOL/HDL Ratio: 6
Triglycerides: 313 mg/dL — ABNORMAL HIGH (ref 0.0–149.0)
VLDL: 62.6 mg/dL — ABNORMAL HIGH (ref 0.0–40.0)

## 2018-04-14 LAB — POCT GLYCOSYLATED HEMOGLOBIN (HGB A1C): HEMOGLOBIN A1C: 5.9 % — AB (ref 4.0–5.6)

## 2018-04-14 LAB — POCT URINALYSIS DIPSTICK
Bilirubin, UA: NEGATIVE
GLUCOSE UA: NEGATIVE
Ketones, UA: NEGATIVE
NITRITE UA: POSITIVE
PROTEIN UA: NEGATIVE
Spec Grav, UA: 1.01 (ref 1.010–1.025)
Urobilinogen, UA: 0.2 E.U./dL
pH, UA: 5 (ref 5.0–8.0)

## 2018-04-14 LAB — LDL CHOLESTEROL, DIRECT: Direct LDL: 122 mg/dL

## 2018-04-14 LAB — T4, FREE: FREE T4: 0.69 ng/dL (ref 0.60–1.60)

## 2018-04-14 LAB — TSH: TSH: 7.49 u[IU]/mL — AB (ref 0.35–4.50)

## 2018-04-14 NOTE — Progress Notes (Signed)
Subjective:     Patient ID: Austin [name], male   DOB: [birth year], 62 y.o.   MRN: [id number]  HPI Patient is here to discuss several things as follows  History of type 2 diabetes. Has been stable off medication. He has history of neurogenic bladder does in and out caths twice weekly. No increased urine frequency. No increased thirst. A1c today 5.9%  Chronic insomnia. Takes trazodone 100 mg daily at bedtime. Occasionally supplements with melatonin. Sleeping fairly well  Recent mildly elevated TSH 5.3. Not on replacement. Has had some recent dry scalp issues. Our plan was to get free T4 and TSH repeat today  History of recurrent UTI. Recent urine culture grew out MRSA. Treated with Septra DS. Has had some recent cloudy urine. No fevers or chills.  He has metastatic renal cell carcinoma and has follow-up with oncology in October for repeat scans and labs  Past Medical History:  Diagnosis Date  . Brainstem stroke ([place]) 10/16/2008   Qualifier: Diagnosis of  By: [name] LPN, [name]    . C. difficile colitis 02/08/2017  . Cerebrovascular accident ([place]) 1994   hx of brainstem stroke, residual diminished lung capacity  . Chronic kidney disease    Left Renal Mass  . 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 Diverticulitis   . Duodenal ulcer   . GIB (gastrointestinal bleeding) 05/28/2017  . Hx of colonic polyps   . Hypertension    has been off BP meds since GI bleed was thought to have been caused by previously prescribed lisinopril   . Hypogonadism   . Intra-abdominal abscess ([place]) 01/23/2017  . Iron deficiency anemia   . 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 Neuromuscular disorder ([place])    quadraplegic  . Open abdominal incision with drainage 01/26/2017  . Pituitary adenoma ([place])   . 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 Pneumonia   . PONV (postoperative nausea and vomiting)    vomited after colostomy placement   .  Pre-diabetes    last a1c 5.6   . 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[place])   . Urinary incontinence   . Venous stasis    edema   Past Surgical History:  Procedure Laterality Date  . CHOLECYSTECTOMY    . COLON RESECTION N/A 05/04/2017   Procedure: LAPAROSCOPIC SIGMOID COLON RESECTION WITH END COLSOTOMY ERAS PATHWAY;  Surgeon: Alphonsa Overall, MD;  [facility];  Service: General;  Laterality: N/A;  . COLONOSCOPY  02/27/2012   Procedure: COLONOSCOPY;  Surgeon: Jerene Bears, MD;  [facility];  Service: Gastroenterology;  Laterality: N/A;  . COLONOSCOPY N/A 05/31/2017   Procedure: COLONOSCOPY;  Surgeon: Milus Banister, MD;  [facility];  Service: Endoscopy;  Laterality: N/A;  . COLOSTOMY TAKEDOWN N/A 08/20/2017   Procedure: LAPAROSCOPIC ASSISTED REVISION END COLOSTOMY AND ENTEROLYSIS OF ADHESIONS;  Surgeon: Alphonsa Overall, MD;  [facility];  Service: General;  Laterality: N/A;  ERAS PATHWAY  . ESOPHAGOGASTRODUODENOSCOPY N/A 05/31/2017   Procedure: ESOPHAGOGASTRODUODENOSCOPY (EGD);  Surgeon: Milus Banister, MD;  [facility];  Service: Endoscopy;  Laterality: N/A;  . gamma knife radiation surgery  05/2010   for pituitary  . IR RADIOLOGIST EVAL & MGMT  02/12/2017  . IR RADIOLOGIST EVAL & MGMT  03/17/2017  . IR RADIOLOGIST EVAL & MGMT  04/14/2017  . IR RADIOLOGIST EVAL & MGMT  02/05/2017  . PITUITARY SURGERY  2007   nose approach  . ROBOT  ASSISTED LAPAROSCOPIC NEPHRECTOMY  04/23/2012   Procedure: ROBOTIC ASSISTED LAPAROSCOPIC NEPHRECTOMY;  Surgeon: Alexis Frock, MD;  [facility];  Service: Urology;  Laterality: Left;  radical  . stomach peg  02/1993   removed 5-6 years later  . TRACHEOSTOMY TUBE PLACEMENT  02/1993   removed 04/1993  . UMBILICAL HERNIA REPAIR  04/23/2012   Procedure: HERNIA REPAIR UMBILICAL ADULT;  Surgeon: Alexis Frock, MD;  [facility];  Service: Urology;;  . [name]  . vocal cord surgery     injected with collagen, then fat from stomach  to improve speech s/p stroke    reports that he quit smoking about 25 years ago. His smoking use included cigarettes. He has a 11.50 pack-year smoking history. He has never used smokeless tobacco. He reports that he does not drink alcohol or use drugs. family history includes Alcohol abuse in his father; Arthritis in his maternal grandmother and mother; Colon cancer in his brother; Esophageal cancer (age of onset: 66) in his father; Hyperlipidemia in his mother; Hypertension in his brother; Stroke in his paternal grandmother; Throat cancer in his father; Uterine cancer (age of onset: 34) in his mother. Allergies  Allergen Reactions  . Penicillins Rash and Other (See Comments)    Has patient had a PCN reaction causing immediate rash, facial/tongue/throat swelling, SOB or lightheadedness with hypotension: YES Has patient had a PCN reaction causing severe rash involving mucus membranes or skin necrosis: No Has patient had a PCN reaction that required hospitalization No Has patient had a PCN reaction occurring within the last 10 years: Yes  If all of the above answers are "NO", then may proceed with Cephalosporin use.     Review of Systems  Constitutional: Negative for fatigue.  Eyes: Negative for visual disturbance.  Respiratory: Negative for cough, chest tightness and shortness of breath.   Cardiovascular: Negative for chest pain, palpitations and leg swelling.  Genitourinary: Positive for dysuria.  Neurological: Negative for dizziness, syncope, weakness, light-headedness and headaches.       Objective:   Physical Exam  Constitutional: He is oriented to person, place, and time. He appears well-developed and well-nourished.  HENT:  Right Ear: External ear normal.  Left Ear: External ear normal.  Mouth/Throat: Oropharynx is clear and moist.  Eyes: Pupils are equal, round, and reactive to light.  Neck: Neck supple. No thyromegaly present.  Cardiovascular: Normal rate and regular rhythm.   Pulmonary/Chest: Effort normal and breath sounds normal. No respiratory distress. He has no wheezes. He has no rales.  Musculoskeletal: He exhibits no edema.  Neurological: He is alert and oriented to person, place, and time.       Assessment:     #1 chronic insomnia  # 2 elevated TSH  #3 history of recurrent UTI risk factor of neurogenic bladder and in and out caths  #4 hyperlipidemia  #5 controlled type 2 diabetes off medication    Plan:     -flu vaccine given -Repeat TSH along with free T4 and lipid panel -Urine dipstick reveals large leukocytes, blood, and nitrites. He is currently afebrile. Send urine culture. -We discussed possible prophylaxis for urinary infections but somewhat reluctant with past history of C. Difficile  [name] MD [facility] at [facility]

## 2018-04-15 ENCOUNTER — Other Ambulatory Visit: Payer: Self-pay | Admitting: Family Medicine

## 2018-04-15 MED ORDER — LEVOTHYROXINE SODIUM 25 MCG PO TABS: 25.0000 ug | ORAL_TABLET | Freq: Every day | ORAL | 1 refills | Status: DC

## 2018-04-16 ENCOUNTER — Other Ambulatory Visit: Payer: Self-pay | Admitting: Family Medicine

## 2018-04-16 LAB — URINE CULTURE
MICRO NUMBER:: 91056421
SPECIMEN QUALITY:: ADEQUATE

## 2018-04-16 MED ORDER — CIPROFLOXACIN HCL 500 MG PO TABS: 500.0000 mg | ORAL_TABLET | Freq: Two times a day (BID) | ORAL | 0 refills | Status: DC

## 2018-04-23 ENCOUNTER — Telehealth: Payer: Self-pay | Admitting: Family Medicine

## 2018-04-23 NOTE — Telephone Encounter (Signed)
Dr. [name], please advise. Thanks

## 2018-04-23 NOTE — Telephone Encounter (Signed)
Last culture did grew out Klebsiella pneumonia (which was susceptible to Cipro)- so he should've been covered Finish out Cipro. If concern for ongoing symptoms by next week lets follow-up urine and urine culture next week.

## 2018-04-23 NOTE — Telephone Encounter (Signed)
Copied from [place] [id number]. Topic: Quick Communication - See Telephone Encounter >> Apr 23, 2018  1:56 PM Sheran Luz wrote: CRM for notification. See Telephone encounter for: 04/23/18.  Pts wife called stating that pt is finishing ciprofloxacin (CIPRO) 500 MG tablet tomorrow but is still experiencing cloudy urine. She is requesting a call back to see if something else needs to be called in before the weekend if possible. Please advise. [phone]

## 2018-04-23 NOTE — Telephone Encounter (Signed)
Called and spoke with pts wife and she is aware of Dr. Anastasio Auerbach recs.  Nothing further is needed.

## 2018-04-26 ENCOUNTER — Telehealth: Payer: Self-pay

## 2018-04-26 ENCOUNTER — Other Ambulatory Visit: Payer: Self-pay

## 2018-04-26 DIAGNOSIS — R8290 Unspecified abnormal findings in urine: Secondary | ICD-10-CM

## 2018-04-26 DIAGNOSIS — Z933 Colostomy status: Secondary | ICD-10-CM | POA: Diagnosis not present

## 2018-04-26 NOTE — Telephone Encounter (Signed)
Last OV 04/14/18, Next OV 07/14/18  Please advise.

## 2018-04-26 NOTE — Telephone Encounter (Signed)
OK  This would need to be cultured.

## 2018-04-26 NOTE — Telephone Encounter (Signed)
Urine Culture has been ordered. Called patient and spoke to wife Peter Congo and let her know that she can bring a sample by tomorrow. Peter Congo verbalized an understanding.

## 2018-04-26 NOTE — Telephone Encounter (Signed)
Copied from [place] [phone]. Topic: Quick Communication - See Telephone Encounter >> Apr 26, 2018  1:58 PM Hewitt Shorts wrote: Pt believes that the bladder infection has not completely gone away and would like to know if wife can bring in a sample tomorrow   Best number to call [phone]

## 2018-04-27 ENCOUNTER — Other Ambulatory Visit: Payer: PPO

## 2018-04-27 DIAGNOSIS — R8290 Unspecified abnormal findings in urine: Secondary | ICD-10-CM

## 2018-04-29 LAB — URINE CULTURE
MICRO NUMBER: 91114186
SPECIMEN QUALITY:: ADEQUATE

## 2018-04-30 ENCOUNTER — Other Ambulatory Visit: Payer: Self-pay

## 2018-04-30 MED ORDER — TRIMETHOPRIM 100 MG PO TABS: 100.0000 mg | ORAL_TABLET | Freq: Every day | ORAL | 0 refills | Status: DC

## 2018-04-30 MED ORDER — SULFAMETHOXAZOLE-TRIMETHOPRIM 800-160 MG PO TABS: 1.0000 | ORAL_TABLET | Freq: Two times a day (BID) | ORAL | 0 refills | Status: DC

## 2018-05-12 ENCOUNTER — Inpatient Hospital Stay: Payer: PPO | Attending: Oncology

## 2018-05-12 ENCOUNTER — Ambulatory Visit (HOSPITAL_COMMUNITY)
Admission: RE | Admit: 2018-05-12 | Discharge: 2018-05-12 | Disposition: A | Discharge status: Home / Self Care | Payer: PPO | Source: Ambulatory Visit | Attending: Oncology | Admitting: Oncology

## 2018-05-12 DIAGNOSIS — D509 Iron deficiency anemia, unspecified: Secondary | ICD-10-CM | POA: Diagnosis not present

## 2018-05-12 DIAGNOSIS — R918 Other nonspecific abnormal finding of lung field: Secondary | ICD-10-CM | POA: Insufficient documentation

## 2018-05-12 DIAGNOSIS — C649 Malignant neoplasm of unspecified kidney, except renal pelvis: Secondary | ICD-10-CM | POA: Diagnosis not present

## 2018-05-12 DIAGNOSIS — C7800 Secondary malignant neoplasm of unspecified lung: Secondary | ICD-10-CM | POA: Insufficient documentation

## 2018-05-12 DIAGNOSIS — N2889 Other specified disorders of kidney and ureter: Secondary | ICD-10-CM | POA: Diagnosis not present

## 2018-05-12 DIAGNOSIS — Z905 Acquired absence of kidney: Secondary | ICD-10-CM | POA: Insufficient documentation

## 2018-05-12 DIAGNOSIS — C7989 Secondary malignant neoplasm of other specified sites: Secondary | ICD-10-CM | POA: Diagnosis not present

## 2018-05-12 DIAGNOSIS — C786 Secondary malignant neoplasm of retroperitoneum and peritoneum: Secondary | ICD-10-CM | POA: Insufficient documentation

## 2018-05-12 DIAGNOSIS — Z933 Colostomy status: Secondary | ICD-10-CM | POA: Diagnosis not present

## 2018-05-12 DIAGNOSIS — D7389 Other diseases of spleen: Secondary | ICD-10-CM | POA: Diagnosis not present

## 2018-05-12 DIAGNOSIS — K5792 Diverticulitis of intestine, part unspecified, without perforation or abscess without bleeding: Secondary | ICD-10-CM | POA: Diagnosis not present

## 2018-05-12 DIAGNOSIS — G8250 Quadriplegia, unspecified: Secondary | ICD-10-CM | POA: Diagnosis not present

## 2018-05-12 DIAGNOSIS — Z8673 Personal history of transient ischemic attack (TIA), and cerebral infarction without residual deficits: Secondary | ICD-10-CM | POA: Diagnosis not present

## 2018-05-12 DIAGNOSIS — Z79899 Other long term (current) drug therapy: Secondary | ICD-10-CM | POA: Diagnosis not present

## 2018-05-12 DIAGNOSIS — C7900 Secondary malignant neoplasm of unspecified kidney and renal pelvis: Secondary | ICD-10-CM | POA: Diagnosis not present

## 2018-05-12 DIAGNOSIS — C801 Malignant (primary) neoplasm, unspecified: Secondary | ICD-10-CM | POA: Diagnosis not present

## 2018-05-12 LAB — CMP (CANCER CENTER ONLY)
ALBUMIN: 3.2 g/dL — AB (ref 3.5–5.0)
ALT: 36 U/L (ref 0–44)
ANION GAP: 7 (ref 5–15)
AST: 20 U/L (ref 15–41)
Alkaline Phosphatase: 87 U/L (ref 38–126)
BUN: 28 mg/dL — AB (ref 8–23)
CHLORIDE: 109 mmol/L (ref 98–111)
CO2: 22 mmol/L (ref 22–32)
Calcium: 9.4 mg/dL (ref 8.9–10.3)
Creatinine: 1.21 mg/dL (ref 0.61–1.24)
GFR, Est AFR Am: >60 mL/min (ref >60)
GFR, Estimated: >60 mL/min (ref >60)
GLUCOSE: 100 mg/dL — AB (ref 70–99)
POTASSIUM: 5.2 mmol/L — AB (ref 3.5–5.1)
Sodium: 138 mmol/L (ref 135–145)
Total Bilirubin: 0.4 mg/dL (ref 0.3–1.2)
Total Protein: 8 g/dL (ref 6.5–8.1)

## 2018-05-12 LAB — CBC WITH DIFFERENTIAL (CANCER CENTER ONLY)
Basophils Absolute: 0.1 10*3/uL (ref 0.0–0.1)
Basophils Relative: 1 %
EOS PCT: 4 %
Eosinophils Absolute: 0.4 10*3/uL (ref 0.0–0.5)
HEMATOCRIT: 45.3 % (ref 38.4–49.9)
Hemoglobin: 14.7 g/dL (ref 13.0–17.1)
LYMPHS ABS: 1 10*3/uL (ref 0.9–3.3)
LYMPHS PCT: 11 %
MCH: 28.8 pg (ref 27.2–33.4)
MCHC: 32.5 g/dL (ref 32.0–36.0)
MCV: 88.6 fL (ref 79.3–98.0)
Monocytes Absolute: 0.9 10*3/uL (ref 0.1–0.9)
Monocytes Relative: 10 %
NEUTROS ABS: 7.3 10*3/uL — AB (ref 1.5–6.5)
Neutrophils Relative %: 74 %
Platelet Count: 181 10*3/uL (ref 140–400)
RBC: 5.11 MIL/uL (ref 4.20–5.82)
RDW: 16.5 % — AB (ref 11.0–14.6)
WBC: 9.8 10*3/uL (ref 4.0–10.3)

## 2018-05-12 MED ORDER — SODIUM CHLORIDE 0.9 % IJ SOLN
INTRAMUSCULAR | Status: AC
  Filled 2018-05-12: qty 50

## 2018-05-12 MED ORDER — IOHEXOL 300 MG/ML  SOLN
100.0000 mL | Freq: Once | INTRAMUSCULAR | Status: AC | PRN
  Administered 2018-05-12: 100 mL via INTRAVENOUS

## 2018-05-14 ENCOUNTER — Telehealth: Payer: Self-pay

## 2018-05-14 ENCOUNTER — Inpatient Hospital Stay (HOSPITAL_BASED_OUTPATIENT_CLINIC_OR_DEPARTMENT_OTHER): Payer: PPO | Admitting: Oncology

## 2018-05-14 VITALS — BP 166/84 | HR 73 | Temp 98.0°F | Resp 17 | Ht 72.0 in

## 2018-05-14 DIAGNOSIS — D509 Iron deficiency anemia, unspecified: Secondary | ICD-10-CM

## 2018-05-14 DIAGNOSIS — Z933 Colostomy status: Secondary | ICD-10-CM | POA: Diagnosis not present

## 2018-05-14 DIAGNOSIS — Z905 Acquired absence of kidney: Secondary | ICD-10-CM

## 2018-05-14 DIAGNOSIS — Z8673 Personal history of transient ischemic attack (TIA), and cerebral infarction without residual deficits: Secondary | ICD-10-CM | POA: Diagnosis not present

## 2018-05-14 DIAGNOSIS — K5792 Diverticulitis of intestine, part unspecified, without perforation or abscess without bleeding: Secondary | ICD-10-CM

## 2018-05-14 DIAGNOSIS — Z79899 Other long term (current) drug therapy: Secondary | ICD-10-CM

## 2018-05-14 DIAGNOSIS — R918 Other nonspecific abnormal finding of lung field: Secondary | ICD-10-CM

## 2018-05-14 DIAGNOSIS — G8250 Quadriplegia, unspecified: Secondary | ICD-10-CM | POA: Diagnosis not present

## 2018-05-14 DIAGNOSIS — C649 Malignant neoplasm of unspecified kidney, except renal pelvis: Secondary | ICD-10-CM

## 2018-05-14 DIAGNOSIS — C786 Secondary malignant neoplasm of retroperitoneum and peritoneum: Secondary | ICD-10-CM

## 2018-05-14 NOTE — Telephone Encounter (Signed)
Printed avs and calender of upcoming appointment. Per 10/4 los

## 2018-05-14 NOTE — Progress Notes (Signed)
Hematology and Oncology Follow Up Visit  Austin [name] [id number] 06/07/1956 62 y.o. 05/14/2018 10:14 AM [name], [name], MDBurchette, [name], MD   Principle Diagnosis: 62 year old man with:  1.  Renal cell carcinoma diagnosed in 2013 and subsequently developed metastatic disease with peritoneal involvement documented in 2015.    2. Brainstem stroke with quadriplegia.  3. 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  He had a colostomy revision in January 2019.   Prior Therapy:   1. He is status post robotic-assisted laparoscopic nephrectomy on the left done on 04/23/2012. The pathology revealed renal cell carcinoma with a grade 3/4 with the pathological staging of T3a.   2. Votrient 800 mg daily started around 12/27/2013. This was reduced to 400 mg subsequently and have been discontinued since 02/03/2014 due to poor tolerance.  Current therapy: Active surveillance.  Interim History: Austin [name] returns today for evaluation.  Since her last visit, he reports no major changes in his health.  He does have recurrent urinary tract infection and currently on suppressive antibiotics daily.  He has not had any recent hospitalizations or illnesses.  He has not had any abdominal discomfort or pain.  His quality of life is marginal but has not changed.  He does eat regularly but his appetite has been overall poor.  His performance status and activity level has not changed.  Continues to use a motorized scooter for his mobility.  Denies any worsening pain overall.  He does have lower back pain which is chronic in nature.  He denies any falls or any other discomfort.  He had any headaches, blurry vision, or seizures.  He denies any alteration in mental status or confusion.  He does not report any fevers or chills or sweats. Has not reported chest pain or difficulty breathing.  He denies any cough or wheezing.  Any chest pain or palpitation or leg  edema.  Has not reported any nausea, vomiting or changes in bowel habits.  Has not reported any hematochezia or melena. He has not reported any bone pain or pathological fractures.  He denied any lymphadenopathy or petechiae. Rest of review of systems is negative.  Medications: I have reviewed the patient's current medications.  Current Outpatient Medications  Medication Sig Dispense Refill  . acetaminophen (TYLENOL) 500 MG tablet Take 1 tablet (500 mg total) by mouth every 6 (six) hours as needed for mild pain, moderate pain, fever or headache. 30 tablet 0  . albuterol (PROVENTIL HFA;VENTOLIN HFA) 108 (90 BASE) MCG/ACT inhaler Inhale 2 puffs into the lungs every 6 (six) hours as needed for wheezing or shortness of breath. 1 Inhaler 0  . BD DISP NEEDLES 18G X 1-1/2" MISC INJECT TESTOSTERONE INTO THE MUSCLE AS DIRECTED EVERY 14 DAYS 6 each 66  . cetirizine (ZYRTEC) 10 MG tablet Take 10 mg by mouth daily with breakfast.    . ciprofloxacin (CIPRO) 500 MG tablet Take 1 tablet (500 mg total) by mouth 2 (two) times daily. 14 tablet 0  . escitalopram (LEXAPRO) 10 MG tablet TAKE 1 TABLET BY MOUTH IN THE MORNING 90 tablet 1  . furosemide (LASIX) 20 MG tablet Take 1 tablet (20 mg total) by mouth daily. (Patient taking differently: Take 20 mg by mouth daily as needed for fluid. ) 30 tablet 0  . ketoconazole (NIZORAL) 2 % cream APPLY AS NEEDED TO RASH (Patient taking differently: Apply to groin or legs daily as needed for rash) 60 g 1  .  levothyroxine (SYNTHROID, LEVOTHROID) 25 MCG tablet Take 1 tablet (25 mcg total) by mouth daily before breakfast. 90 tablet 1  . Melatonin 3 MG TABS Take 3 mg by mouth at bedtime as needed (for sleep).    . Multiple Vitamin (MULTIVITAMIN WITH MINERALS) TABS tablet Take 1 tablet by mouth daily with breakfast.     . pantoprazole (PROTONIX) 40 MG tablet Take 1 tablet (40 mg total) by mouth daily. 30 tablet 4  . sulfamethoxazole-trimethoprim (BACTRIM DS,SEPTRA DS) 800-160 MG tablet  Take 1 tablet by mouth 2 (two) times daily. 14 tablet 0  . Syringe/Needle, Disp, (SYRINGE 3CC/22GX1-1/2") 22G X 1-1/2" 3 ML MISC INJECT 1ML OF TESTOSTERONE EVERY 14 DAYS 6 each 66  . testosterone enanthate (DELATESTRYL) 200 MG/ML injection INJECT 1ML INTO THE MUSCLE EVERY 2 WEEKS 5 mL 0  . traZODone (DESYREL) 100 MG tablet TAKE 1 TABLET BY MOUTH AT BEDTIME 90 tablet 1  . triamcinolone cream (KENALOG) 0.1 % Apply 1 application topically 2 (two) times daily as needed (for rash). Applies to face    . trimethoprim (TRIMPEX) 100 MG tablet Take 1 tablet (100 mg total) by mouth daily. 30 tablet 0   No current facility-administered medications for this visit.      Allergies:  Allergies  Allergen Reactions  . Penicillins Rash and Other (See Comments)    Has patient had a PCN reaction causing immediate rash, facial/tongue/throat swelling, SOB or lightheadedness with hypotension: YES Has patient had a PCN reaction causing severe rash involving mucus membranes or skin necrosis: No Has patient had a PCN reaction that required hospitalization No Has patient had a PCN reaction occurring within the last 10 years: Yes  If all of the above answers are "NO", then may proceed with Cephalosporin use.    Past Medical History, Surgical history, Social history, and Family History were reviewed and updated.    Physical Exam: Blood pressure (!) 166/84, pulse 73, temperature 98 F (36.7 C), temperature source Oral, resp. rate 17, height 6' (1.829 m), SpO2 98 %.   ECOG: 2   General appearance: Comfortable appearing without any discomfort Head: Normocephalic without any trauma Oropharynx: Mucous membranes are moist and pink without any thrush or ulcers. Eyes: Pupils are equal and round reactive to light. Lymph nodes: No cervical, supraclavicular, inguinal or axillary lymphadenopathy.   Heart:regular rate and rhythm.  S1 and S2 without leg edema. Lung: Clear without any rhonchi or wheezes.  No dullness to  percussion. Abdomin: Soft, nontender, nondistended with good bowel sounds.  No hepatosplenomegaly. Musculoskeletal: No joint deformity or effusion.  Full range of motion noted. Neurological: No new deficits noted.  He is quadriplegic. Skin: No petechial rash or dryness.  Appeared moist.     Lab Results: Lab Results  Component Value Date   WBC 9.8 05/12/2018   HGB 14.7 05/12/2018   HCT 45.3 05/12/2018   MCV 88.6 05/12/2018   PLT 181 05/12/2018     Chemistry      Component Value Date/Time   NA 138 05/12/2018 1049   NA 138 03/13/2017 0922   K 5.2 (H) 05/12/2018 1049   K 4.6 03/13/2017 0922   CL 109 05/12/2018 1049   CO2 22 05/12/2018 1049   CO2 24 03/13/2017 0922   BUN 28 (H) 05/12/2018 1049   BUN 27.4 (H) 03/13/2017 0922   CREATININE 1.21 05/12/2018 1049   CREATININE 0.87 06/05/2017 1643   CREATININE 1.3 03/13/2017 0922      Component Value Date/Time   CALCIUM  9.4 05/12/2018 1049   CALCIUM 9.3 03/13/2017 0922   ALKPHOS 87 05/12/2018 1049   ALKPHOS 91 03/13/2017 0922   AST 20 05/12/2018 1049   AST 17 03/13/2017 0922   ALT 36 05/12/2018 1049   ALT 21 03/13/2017 0922   BILITOT 0.4 05/12/2018 1049   BILITOT 0.27 03/13/2017 0922       EXAM: CT CHEST WITH CONTRAST  CT ABDOMEN AND PELVIS WITH AND WITHOUT CONTRAST  TECHNIQUE: Multidetector CT imaging of the chest was performed during intravenous contrast administration. Multidetector CT imaging of the abdomen and pelvis was performed following the standard protocol before and during bolus administration of intravenous contrast.  CONTRAST:  126mL OMNIPAQUE IOHEXOL 300 MG/ML  SOLN  COMPARISON:  Multiple prior CT scans from 2018. The most recent is 05/28/2017 which was noncontrast. The most recent contrast study is 02/05/2017. Prior PET-CT 12/25/2015  FINDINGS: CT CHEST FINDINGS  Cardiovascular: The heart is normal in size. No pericardial effusion. The aorta is normal in caliber. No dissection. The  branch vessels are patent. Mild-to-moderate scattered atherosclerotic calcifications. Scattered coronary artery calcifications.  Mediastinum/Nodes: No mediastinal or hilar mass or lymphadenopathy. Small scattered lymph nodes are noted. The esophagus is grossly normal.  Lungs/Pleura: Numerous pulmonary nodules consistent with pulmonary metastatic disease.  9.5 mm right middle lobe nodule on image number 77.  10 mm superior segment right lower lobe pulmonary nodule on image number 67.  9 mm left lower lobe pulmonary nodule on image number 92.  Numerous other pulmonary nodules.  Underlying emphysematous changes with areas of pulmonary scarring. No acute pulmonary findings.  Musculoskeletal: No definite findings for osseous metastatic disease. Severe degenerative changes noted in the cervical spine.  CT ABDOMEN AND PELVIS FINDINGS  Hepatobiliary: No focal hepatic lesions to suggest metastatic disease. The gallbladder surgically absent. No common bile duct dilatation.  Pancreas: No mass, inflammation or ductal dilatation. Duodenal diverticulum noted near the pancreatic head.  Spleen: Stable 3.5 cm splenic lesion.  Adrenals/Urinary Tract: Small right adrenal gland nodule is stable. Stable enhancing mass associated with the lateral limb of the left adrenal gland measuring approximately 2.4 x 1.7 cm. This most recently measured 1.8 x 1.3 cm.  Prior left nephrectomy. There is a persistent rim calcified fatty lesion in the nephrectomy bed which may be fat necrosis or fat infarction. There is a new enhancing nodule along the lateral margin of this area on image number 57 of series 6 which measures 6 mm and is likely a small metastasis.  The right kidney is unremarkable. No renal lesion or hydronephrosis. The bladder is unremarkable.  Stomach/Bowel: The stomach, duodenum, small bowel and colon appear stable. There is a Hartmann's pouch and a left lower  quadrant colostomy.  Vascular/Lymphatic: Stable atherosclerotic calcifications involving the abdominal aorta and iliac arteries. No aneurysm or dissection. The branch vessels are patent. The major venous structures are patent.  No retroperitoneal lymphadenopathy.  Numerous hyperenhancing lesions are again demonstrated in the abdomen.  23.5 mm lesion along the anterior and inferior border of the liver on series 6, image 44 previously measured 21.5 mm.  26 x 25 mm hyperenhancing lesion in the right pericolic gutter on image number 70 of series 6 previously measured 20 x 13.5 mm.  Large irregular enhancing mass in the right paracolic gutter on image number 85 measures 7.6 x 6.3 cm and previously measured 6.7 x 5.4 cm. It appears to invade the transversalis muscle and there are large recruited feeding vessels.  Reproductive: The prostate gland and  seminal vesicles are unremarkable.  Other: No pelvic mass or adenopathy. There are stable large fatty lymph nodes in the pelvis. No inguinal adenopathy. No free abdominal or pelvic fluid collections.  Musculoskeletal: No obvious osseous metastatic disease.  IMPRESSION: 1. Numerous metastatic pulmonary nodules new since prior PET-CT of 2019. No prior chest CT for comparison. 2. Enlarging metastatic implants in the abdomen as detailed above. There is also a new small enhancing lesion along the lateral margin of the left nephrectomy bed. 3. The largest lesion in the right lower quadrant is invading the transversalis muscle. 4. Stable splenic lesion.    Impression and Plan:  62 year old man with:  1.  Renal cell carcinoma with documented metastatic disease to the peritoneum in 2015 after nephrectomy in 2013.  He has declined systemic therapy after a brief trial and has been on supportive care only.  CT scan obtained on October 2019 showed progression of disease including enlarging peritoneal implant as well as new  pulmonary nodules.  The natural course of this disease as well as treatment options were reviewed.  These options would include continue supportive care, starting systemic therapy with oral targeted therapy or intravenous immune therapy.  The rationale for using these options were discussed today especially in the setting of his quadriplegia and very limited performance status and quality of life.  After discussion today, he has elected to continue with supportive care only.  He understands his disease is progressing and likely will take over his body in a way that we will debilitate him further.  He also understands the window to treat his cancer will close eventually and if his cancer becomes more debilitating, he will enroll in hospice.  2.  Recurrent diverticulitis: No recent exacerbation noted.  He has a colostomy in place.  3.  Iron deficiency anemia: Resolved at this time with hemoglobin is back to normal.  4. Prognosis: His disease is progressing and is incurable.  His performance status is limited which narrows a therapeutic window for any treatment.  5. Followup: In 4 months to follow his progress.  25  minutes was spent with the patient face-to-face today.  More than 50% of time was dedicated to reviewing the natural course of his disease, reviewing imaging studies and discussing treatment options and complications related to these therapies.   Zola Button, MD 10/4/201910:14 AM

## 2018-05-18 ENCOUNTER — Other Ambulatory Visit: Payer: Self-pay | Admitting: Family Medicine

## 2018-05-28 ENCOUNTER — Other Ambulatory Visit: Payer: Self-pay | Admitting: Family Medicine

## 2018-05-28 NOTE — Telephone Encounter (Signed)
Called Austin [name], patients wife and she stated that she would like to do another month of the Trimpex as she thinks it has helped and thinks his infection is not clear but way better than was before starting this medication. 1 month refill sent.

## 2018-05-31 ENCOUNTER — Encounter: Payer: Self-pay | Admitting: Family Medicine

## 2018-05-31 ENCOUNTER — Ambulatory Visit (INDEPENDENT_AMBULATORY_CARE_PROVIDER_SITE_OTHER): Payer: PPO | Admitting: Family Medicine

## 2018-05-31 VITALS — BP 130/70 | HR 60 | Temp 97.6°F

## 2018-05-31 DIAGNOSIS — R300 Dysuria: Secondary | ICD-10-CM

## 2018-05-31 DIAGNOSIS — N390 Urinary tract infection, site not specified: Secondary | ICD-10-CM | POA: Diagnosis not present

## 2018-05-31 DIAGNOSIS — T83511A Infection and inflammatory reaction due to indwelling urethral catheter, initial encounter: Secondary | ICD-10-CM | POA: Diagnosis not present

## 2018-05-31 DIAGNOSIS — Z933 Colostomy status: Secondary | ICD-10-CM | POA: Diagnosis not present

## 2018-05-31 LAB — POCT URINALYSIS DIPSTICK
BILIRUBIN UA: NEGATIVE
GLUCOSE UA: NEGATIVE
Ketones, UA: NEGATIVE
Nitrite, UA: POSITIVE
PROTEIN UA: POSITIVE — AB
Spec Grav, UA: 1.02 (ref 1.010–1.025)
Urobilinogen, UA: 0.2 E.U./dL
pH, UA: 6.5 (ref 5.0–8.0)

## 2018-05-31 MED ORDER — SULFAMETHOXAZOLE-TRIMETHOPRIM 800-160 MG PO TABS: 1.0000 | ORAL_TABLET | Freq: Two times a day (BID) | ORAL | 0 refills | Status: DC

## 2018-05-31 NOTE — Patient Instructions (Signed)
Let us know if any worsening of symptoms. I will call you when we get culture results.

## 2018-05-31 NOTE — Progress Notes (Signed)
Austin [name] DOB: 16-Mar-1956 Encounter date: 05/31/2018  This is a 62 y.o. male who presents with Chief Complaint  Patient presents with  . Urinary Tract Infection    dark urine, cloudy, dysuria,     History of present illness: Hx of neurogenic bladder; frequent catheters. After September infection started Trimethoprim 100mg  daily for prophylaxis.  Seems like every month the UTI is coming back. Has been doing same routine for 20 years. Straight cath morning and night. During day is wearing external. Has sensation and has increase dysuria as well as dark urine, cloudy urine. Had some lower abd pain but more lower right side and this has resolved.   Past year has had infection nearly every month. Last year fought C diff so they have been very careful with antibiotic use.   BM are normal (has colostomy bag)   Have been cathing same way for 20 years; no change in method. Wife doing it for him.   Allergies  Allergen Reactions  . Penicillins Rash and Other (See Comments)    Has patient had a PCN reaction causing immediate rash, facial/tongue/throat swelling, SOB or lightheadedness with hypotension: YES Has patient had a PCN reaction causing severe rash involving mucus membranes or skin necrosis: No Has patient had a PCN reaction that required hospitalization No Has patient had a PCN reaction occurring within the last 10 years: Yes  If all of the above answers are "NO", then may proceed with Cephalosporin use.   No outpatient medications have been marked as taking for the 05/31/18 encounter (Office Visit) with Caren Macadam, MD.    Review of Systems  Constitutional: Negative for chills, fatigue and fever.  Respiratory: Negative for cough, chest tightness, shortness of breath and wheezing.   Cardiovascular: Negative for chest pain, palpitations and leg swelling.  Gastrointestinal: Negative for abdominal pain, constipation, diarrhea and vomiting.  Genitourinary: Positive for  dysuria and hematuria. Negative for decreased urine volume, flank pain and frequency.    Objective:  BP 130/70 (BP Location: Right Arm, Patient Position: Sitting, Cuff Size: Normal)   Pulse 60   Temp 97.6 F (36.4 C) (Oral)   SpO2 98%       BP Readings from Last 3 Encounters:  05/31/18 130/70  05/14/18 (!) 166/84  02/26/18 (!) 152/80   Wt Readings from Last 3 Encounters:  01/12/18 170 lb (77.1 kg)  09/11/17 170 lb (77.1 kg)  08/20/17 165 lb (74.8 kg)    Physical Exam  Constitutional: He is oriented to person, place, and time. He appears well-developed and well-nourished. No distress.  Cardiovascular: Normal rate, regular rhythm and normal heart sounds. Exam reveals no friction rub.  No murmur heard. No lower extremity edema  Pulmonary/Chest: Effort normal and breath sounds normal. No respiratory distress. He has no wheezes. He has no rales.  Abdominal: Soft. Bowel sounds are normal. He exhibits no distension and no mass. There is no tenderness. There is no guarding.  Neurological: He is alert and oriented to person, place, and time.  Voice soft  Psychiatric: He has a normal mood and affect. His behavior is normal. Thought content normal. Cognition and memory are normal.    Assessment/Plan 1. Dysuria Suspect infection. Will increase bactrim to DS strength BID until cx results available. Reculture after treatment. Consider re-eval with urology in case intervention would give assistance. - POCT urinalysis dipstick - Urine Culture  2. Urinary tract infection associated with catheterization of urinary tract, unspecified indwelling urinary catheter type, initial encounter ([place])  See above.  - sulfamethoxazole-trimethoprim (BACTRIM DS,SEPTRA DS) 800-160 MG tablet; Take 1 tablet by mouth 2 (two) times daily.  Dispense: 6 tablet; Refill: 0 - Urine Culture  Return if symptoms worsen or fail to improve.      Micheline Rough, MD

## 2018-06-02 LAB — URINE CULTURE
MICRO NUMBER: 91262665
SPECIMEN QUALITY:: ADEQUATE

## 2018-06-02 NOTE — Addendum Note (Signed)
Addended by: Agnes Lawrence on: 06/02/2018 02:50 PM   Modules accepted: Orders

## 2018-06-03 DIAGNOSIS — R32 Unspecified urinary incontinence: Secondary | ICD-10-CM | POA: Diagnosis not present

## 2018-06-04 DIAGNOSIS — T83511A Infection and inflammatory reaction due to indwelling urethral catheter, initial encounter: Secondary | ICD-10-CM | POA: Diagnosis not present

## 2018-06-04 DIAGNOSIS — N390 Urinary tract infection, site not specified: Secondary | ICD-10-CM | POA: Diagnosis not present

## 2018-06-04 NOTE — Addendum Note (Signed)
Addended by: Diona Foley on: 06/04/2018 05:31 PM   Modules accepted: Orders

## 2018-06-07 LAB — UNLABELED: TEST ORDERED ON REQ: 395

## 2018-06-08 LAB — URINE CULTURE
MICRO NUMBER:: 91286597
SPECIMEN QUALITY:: ADEQUATE

## 2018-06-28 ENCOUNTER — Other Ambulatory Visit (INDEPENDENT_AMBULATORY_CARE_PROVIDER_SITE_OTHER): Payer: PPO

## 2018-06-28 ENCOUNTER — Telehealth: Payer: Self-pay

## 2018-06-28 ENCOUNTER — Other Ambulatory Visit: Payer: Self-pay

## 2018-06-28 DIAGNOSIS — R8290 Unspecified abnormal findings in urine: Secondary | ICD-10-CM

## 2018-06-28 DIAGNOSIS — R300 Dysuria: Secondary | ICD-10-CM | POA: Diagnosis not present

## 2018-06-28 NOTE — Telephone Encounter (Signed)
Called wife Peter Congo and let her know OK to bring sample. POCT UA and culture have been ordered.

## 2018-06-28 NOTE — Telephone Encounter (Signed)
Please advise if okay to drop off sample?

## 2018-06-28 NOTE — Telephone Encounter (Signed)
Yes but we will need to go ahead and culture.

## 2018-06-28 NOTE — Telephone Encounter (Signed)
Copied from [place] [id number]. Topic: Appointment Scheduling - Scheduling Inquiry for Clinic >> Jun 28, 2018  8:49 AM Lennox Solders wrote: Reason for CRM: pt has an history of bladder infection and having symptoms cloudy urine etc . Pt wife is calling and her husband would like to drop off urine samples today. Pt does not want to see another provider

## 2018-06-30 LAB — URINE CULTURE
MICRO NUMBER:: 91386119
SPECIMEN QUALITY:: ADEQUATE

## 2018-07-01 ENCOUNTER — Other Ambulatory Visit: Payer: Self-pay

## 2018-07-01 MED ORDER — CIPROFLOXACIN HCL 500 MG PO TABS: 500.0000 mg | ORAL_TABLET | Freq: Two times a day (BID) | ORAL | 0 refills | Status: DC

## 2018-07-05 ENCOUNTER — Other Ambulatory Visit: Payer: Self-pay | Admitting: Internal Medicine

## 2018-07-06 DIAGNOSIS — R32 Unspecified urinary incontinence: Secondary | ICD-10-CM | POA: Diagnosis not present

## 2018-07-07 ENCOUNTER — Other Ambulatory Visit: Payer: Self-pay | Admitting: Family Medicine

## 2018-07-07 NOTE — Telephone Encounter (Signed)
I do not recall stopping this.  Would add back to list and refill for one year.

## 2018-07-12 NOTE — Telephone Encounter (Signed)
Rx done. 

## 2018-07-14 ENCOUNTER — Ambulatory Visit (INDEPENDENT_AMBULATORY_CARE_PROVIDER_SITE_OTHER): Payer: PPO | Admitting: Family Medicine

## 2018-07-14 ENCOUNTER — Encounter: Payer: Self-pay | Admitting: Family Medicine

## 2018-07-14 VITALS — BP 142/68 | HR 68 | Temp 97.8°F

## 2018-07-14 DIAGNOSIS — E119 Type 2 diabetes mellitus without complications: Secondary | ICD-10-CM

## 2018-07-14 DIAGNOSIS — E039 Hypothyroidism, unspecified: Secondary | ICD-10-CM | POA: Diagnosis not present

## 2018-07-14 DIAGNOSIS — R8290 Unspecified abnormal findings in urine: Secondary | ICD-10-CM

## 2018-07-14 LAB — POCT URINALYSIS DIPSTICK
BILIRUBIN UA: NEGATIVE
GLUCOSE UA: NEGATIVE
Ketones, UA: NEGATIVE
Nitrite, UA: POSITIVE
Protein, UA: NEGATIVE
Spec Grav, UA: 1.02 (ref 1.010–1.025)
Urobilinogen, UA: 0.2 E.U./dL
pH, UA: 5.5 (ref 5.0–8.0)

## 2018-07-14 LAB — TSH: TSH: 4.74 u[IU]/mL — ABNORMAL HIGH (ref 0.35–4.50)

## 2018-07-14 NOTE — Progress Notes (Signed)
Subjective:     Patient ID: Austin [name], male   DOB: [birth year], 62 y.o.   MRN: [id number]  HPI Patient seen for the following issues.  History of recurrent UTI.  We suspected possible colonization.  However, he has grown out separate pathogens recently including Enterobacter, Klebsiella, and MRSA.  At this point only has some cloudy urine but no fever.  No flank pain.  Has neurogenic bladder and high risk for UTI and high risk for colonization  Type 2 diabetes.  Currently not on medications.  No polyuria.  He has lost some weight over the past couple years likely related to his metastatic renal cell cancer which is currently not treated  Hypothyroidism.  Elevating TSH and increased fatigue and we initiated low-dose levothyroxine few months ago.  Needs follow-up levels.  Fatigue unchanged  Past Medical History:  Diagnosis Date  . 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 C. difficile colitis 02/08/2017  . Cerebrovascular accident ([place]) 1994   hx of brainstem stroke, residual diminished lung capacity  . Chronic kidney disease    Left Renal Mass  . 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 Diverticulitis   . Duodenal ulcer   . GIB (gastrointestinal bleeding) 05/28/2017  . Hx of colonic polyps   . Hypertension    has been off BP meds since GI bleed was thought to have been caused by previously prescribed lisinopril   . Hypogonadism   . Intra-abdominal abscess ([place]) 01/23/2017  . Iron deficiency anemia   . 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 Open abdominal incision with drainage 01/26/2017  . Pituitary adenoma ([place])   . 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 Pneumonia   . PONV (postoperative nausea and vomiting)    vomited after colostomy placement   . Pre-diabetes    last a1c 5.6   . 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[place])   . Urinary  incontinence   . Venous stasis    edema   Past Surgical History:  Procedure Laterality Date  . CHOLECYSTECTOMY    . COLON RESECTION N/A 05/04/2017   Procedure: LAPAROSCOPIC SIGMOID COLON RESECTION WITH END COLSOTOMY ERAS PATHWAY;  Surgeon: Alphonsa Overall, MD;  [facility]: [facility];  Service: General;  Laterality: N/A;  . COLONOSCOPY  02/27/2012   Procedure: COLONOSCOPY;  Surgeon: Jerene Bears, MD;  [facility];  Service: Gastroenterology;  Laterality: N/A;  . COLONOSCOPY N/A 05/31/2017   Procedure: COLONOSCOPY;  Surgeon: Milus Banister, MD;  [facility];  Service: Endoscopy;  Laterality: N/A;  . COLOSTOMY TAKEDOWN N/A 08/20/2017   Procedure: LAPAROSCOPIC ASSISTED REVISION END COLOSTOMY AND ENTEROLYSIS OF ADHESIONS;  Surgeon: Alphonsa Overall, MD;  [facility];  Service: General;  Laterality: N/A;  ERAS PATHWAY  . ESOPHAGOGASTRODUODENOSCOPY N/A 05/31/2017   Procedure: ESOPHAGOGASTRODUODENOSCOPY (EGD);  Surgeon: Milus Banister, MD;  Location: [facility];  Service: Endoscopy;  Laterality: N/A;  . gamma knife radiation surgery  05/2010   for pituitary  . IR RADIOLOGIST EVAL & MGMT  02/12/2017  . IR RADIOLOGIST EVAL & MGMT  03/17/2017  . IR RADIOLOGIST EVAL & MGMT  04/14/2017  . IR RADIOLOGIST EVAL & MGMT  02/05/2017  . PITUITARY SURGERY  2007   nose approach  . ROBOT ASSISTED LAPAROSCOPIC NEPHRECTOMY  04/23/2012   Procedure: ROBOTIC ASSISTED LAPAROSCOPIC NEPHRECTOMY;  Surgeon: Alexis Frock, MD;  [facility];  Service: Urology;  Laterality: Left;  radical  . stomach peg  02/1993   removed 5-6 years later  . TRACHEOSTOMY TUBE PLACEMENT  02/1993   removed 04/1993  . UMBILICAL HERNIA REPAIR  04/23/2012   Procedure: HERNIA REPAIR UMBILICAL ADULT;  Surgeon: Alexis Frock, MD;  [facility];  Service: Urology;;  . [name]  . vocal cord surgery     injected with collagen, then fat from stomach to improve speech s/p stroke    reports that he quit smoking about  25 years ago. His smoking use included cigarettes. He has a 11.50 pack-year smoking history. He has never used smokeless tobacco. He reports that he does not drink alcohol or use drugs. family history includes Alcohol abuse in his father; Arthritis in his maternal grandmother and mother; Colon cancer in his brother; Esophageal cancer (age of onset: 105) in his father; Hyperlipidemia in his mother; Hypertension in his brother; Stroke in his paternal grandmother; Throat cancer in his father; Uterine cancer (age of onset: 38) in his mother. Allergies  Allergen Reactions  . Penicillins Rash and Other (See Comments)    Has patient had a PCN reaction causing immediate rash, facial/tongue/throat swelling, SOB or lightheadedness with hypotension: YES Has patient had a PCN reaction causing severe rash involving mucus membranes or skin necrosis: No Has patient had a PCN reaction that required hospitalization No Has patient had a PCN reaction occurring within the last 10 years: Yes  If all of the above answers are "NO", then may proceed with Cephalosporin use.     Review of Systems  Constitutional: Positive for fatigue. Negative for chills and fever.  Respiratory: Negative for shortness of breath.   Cardiovascular: Negative for chest pain.  Gastrointestinal: Negative for abdominal pain and diarrhea.  Genitourinary: Negative for hematuria.       Objective:   Physical Exam  Constitutional: He is oriented to person, place, and time. He appears well-developed and well-nourished.  HENT:  Mouth/Throat: Oropharynx is clear and moist.  Cardiovascular: Normal rate and regular rhythm.  Pulmonary/Chest: Effort normal and breath sounds normal.  Musculoskeletal: He exhibits no edema.  Neurological: He is alert and oriented to person, place, and time.       Assessment:     #1 type 2 diabetes well-controlled with A1c 5.5% today  #2 hypothyroidism.  Recent initiation of low-dose Synthroid  #3 history of  recurrent UTI.  High risk for colonization    Plan:     -Recheck TSH -Continue current medications -Follow-up promptly for any fever or other concerns otherwise routine follow-up in 3 months  [name] MD [facility]

## 2018-07-16 LAB — URINE CULTURE
MICRO NUMBER:: 91452323
SPECIMEN QUALITY:: ADEQUATE

## 2018-08-05 ENCOUNTER — Other Ambulatory Visit: Payer: Self-pay | Admitting: Family Medicine

## 2018-08-09 ENCOUNTER — Other Ambulatory Visit: Payer: Self-pay | Admitting: Oncology

## 2018-08-17 DIAGNOSIS — R32 Unspecified urinary incontinence: Secondary | ICD-10-CM | POA: Diagnosis not present

## 2018-08-25 ENCOUNTER — Encounter: Payer: Self-pay | Admitting: Family Medicine

## 2018-08-25 ENCOUNTER — Ambulatory Visit (INDEPENDENT_AMBULATORY_CARE_PROVIDER_SITE_OTHER): Payer: PPO | Admitting: Family Medicine

## 2018-08-25 VITALS — BP 160/80 | HR 63 | Temp 98.0°F

## 2018-08-25 DIAGNOSIS — R300 Dysuria: Secondary | ICD-10-CM

## 2018-08-25 DIAGNOSIS — R7989 Other specified abnormal findings of blood chemistry: Secondary | ICD-10-CM

## 2018-08-25 DIAGNOSIS — E039 Hypothyroidism, unspecified: Secondary | ICD-10-CM

## 2018-08-25 DIAGNOSIS — R030 Elevated blood-pressure reading, without diagnosis of hypertension: Secondary | ICD-10-CM | POA: Diagnosis not present

## 2018-08-25 LAB — POC URINALSYSI DIPSTICK (AUTOMATED)
Bilirubin, UA: NEGATIVE
Glucose, UA: NEGATIVE
KETONES UA: NEGATIVE
Nitrite, UA: POSITIVE
Protein, UA: POSITIVE — AB
Spec Grav, UA: 1.025 (ref 1.010–1.025)
Urobilinogen, UA: 0.2 E.U./dL
pH, UA: 6 (ref 5.0–8.0)

## 2018-08-25 LAB — TSH: TSH: 3.68 u[IU]/mL (ref 0.35–4.50)

## 2018-08-25 LAB — TESTOSTERONE: Testosterone: 872.65 ng/dL (ref 300.00–890.00)

## 2018-08-25 MED ORDER — CIPROFLOXACIN HCL 500 MG PO TABS: 500.0000 mg | ORAL_TABLET | Freq: Two times a day (BID) | ORAL | 0 refills | Status: DC

## 2018-08-25 NOTE — Progress Notes (Signed)
Subjective:     Patient ID: Austin [name], male   DOB: [birth year], 63 y.o.   MRN: [id number]  HPI Patient here for the following issues  Concern for possible recurrent UTI.  He has neurogenic bladder from prior stroke.  They do in and out caths twice per day.  She uses the catheter for about a week at a time but she has done the same kind of sterilization procedure with vinegar soaks for several years.  They are concerned because he has had previous pneumonia and had no fever and they were concerned about waiting for fever as response.  They are aware of increased risk of colonization.  He has had a couple days of chills and increased sweats.  They have also noticed some cloudy urine changes.  The last few cultures have grown out Klebsiella.  He has grown out other pathogens in the past.  Frequent feeling cold and chilled.  Last TSH was a little over 5.  He is on thyroid replacement and takes regularly.  Does have some chronic fatigue issues  Low testosterone.  They are requesting recheck level.  He had level over 800 back in June.  He has had some recent problems with erectile dysfunction.  This is a new complaint.  History of hypertension.  Blood pressure had been doing very well but is up today.  No headaches.  No chest pains.  No dyspnea.  Past Medical History:  Diagnosis Date  . 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 C. difficile colitis 02/08/2017  . Cerebrovascular accident ([place]) 1994   hx of brainstem stroke, residual diminished lung capacity  . Chronic kidney disease    Left Renal Mass  . 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 Diverticulitis   . Duodenal ulcer   . GIB (gastrointestinal bleeding) 05/28/2017  . Hx of colonic polyps   . Hypertension    has been off BP meds since GI bleed was thought to have been caused by previously prescribed lisinopril   . Hypogonadism   . Intra-abdominal abscess ([place]) 01/23/2017  . Iron deficiency  anemia   . 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 Open abdominal incision with drainage 01/26/2017  . Pituitary adenoma ([place])   . 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 Pneumonia   . PONV (postoperative nausea and vomiting)    vomited after colostomy placement   . Pre-diabetes    last a1c 5.6   . 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[place])   . Urinary incontinence   . Venous stasis    edema   Past Surgical History:  Procedure Laterality Date  . CHOLECYSTECTOMY    . COLON RESECTION N/A 05/04/2017   Procedure: LAPAROSCOPIC SIGMOID COLON RESECTION WITH END COLSOTOMY ERAS PATHWAY;  Surgeon: Alphonsa Overall, MD;  [facility];  Service: General;  Laterality: N/A;  . COLONOSCOPY  02/27/2012   Procedure: COLONOSCOPY;  Surgeon: Jerene Bears, MD;  [facility];  Service: Gastroenterology;  Laterality: N/A;  . COLONOSCOPY N/A 05/31/2017   Procedure: COLONOSCOPY;  Surgeon: Milus Banister, MD;  [facility];  Service: Endoscopy;  Laterality: N/A;  . COLOSTOMY TAKEDOWN N/A 08/20/2017   Procedure: LAPAROSCOPIC ASSISTED REVISION END COLOSTOMY AND ENTEROLYSIS OF ADHESIONS;  Surgeon: Alphonsa Overall, MD;  [facility];  Service: General;  Laterality: N/A;  ERAS PATHWAY  . ESOPHAGOGASTRODUODENOSCOPY N/A 05/31/2017   Procedure: ESOPHAGOGASTRODUODENOSCOPY (EGD);  Surgeon: Milus Banister, MD;  Location: [facility];  Service: Endoscopy;  Laterality: N/A;  . gamma knife radiation surgery  05/2010   for pituitary  . IR RADIOLOGIST EVAL & MGMT  02/12/2017  . IR RADIOLOGIST EVAL & MGMT  03/17/2017  . IR RADIOLOGIST EVAL & MGMT  04/14/2017  . IR RADIOLOGIST EVAL & MGMT  02/05/2017  . PITUITARY SURGERY  2007   nose approach  . ROBOT ASSISTED LAPAROSCOPIC NEPHRECTOMY  04/23/2012   Procedure: ROBOTIC ASSISTED LAPAROSCOPIC NEPHRECTOMY;  Surgeon: Alexis Frock, MD;  [facility];   Service: Urology;  Laterality: Left;  radical  . stomach peg  02/1993   removed 5-6 years later  . TRACHEOSTOMY TUBE PLACEMENT  02/1993   removed 04/1993  . UMBILICAL HERNIA REPAIR  04/23/2012   Procedure: HERNIA REPAIR UMBILICAL ADULT;  Surgeon: Alexis Frock, MD;  [facility];  Service: Urology;;  . [name]  . vocal cord surgery     injected with collagen, then fat from stomach to improve speech s/p stroke    reports that he quit smoking about 25 years ago. His smoking use included cigarettes. He has a 11.50 pack-year smoking history. He has never used smokeless tobacco. He reports that he does not drink alcohol or use drugs. family history includes Alcohol abuse in his father; Arthritis in his maternal grandmother and mother; Colon cancer in his brother; Esophageal cancer (age of onset: 79) in his father; Hyperlipidemia in his mother; Hypertension in his brother; Stroke in his paternal grandmother; Throat cancer in his father; Uterine cancer (age of onset: 18) in his mother. Allergies  Allergen Reactions  . Penicillins Rash and Other (See Comments)    Has patient had a PCN reaction causing immediate rash, facial/tongue/throat swelling, SOB or lightheadedness with hypotension: YES Has patient had a PCN reaction causing severe rash involving mucus membranes or skin necrosis: No Has patient had a PCN reaction that required hospitalization No Has patient had a PCN reaction occurring within the last 10 years: Yes  If all of the above answers are "NO", then may proceed with Cephalosporin use.     Review of Systems  Constitutional: Positive for chills and fatigue. Negative for fever.  Respiratory: Negative for shortness of breath.   Cardiovascular: Negative for chest pain.  Gastrointestinal: Negative for abdominal pain.  Neurological: Negative for dizziness and headaches.  Psychiatric/Behavioral: Negative for confusion.       Objective:   Physical Exam Constitutional:       Appearance: He is well-developed.  Eyes:     Pupils: Pupils are equal, round, and reactive to light.  Neck:     Musculoskeletal: Neck supple.     Thyroid: No thyromegaly.  Cardiovascular:     Rate and Rhythm: Normal rate and regular rhythm.  Pulmonary:     Effort: Pulmonary effort is normal. No respiratory distress.     Breath sounds: Normal breath sounds. No wheezing or rales.  Neurological:     Mental Status: He is alert and oriented to person, place, and time.        Assessment:     #1 concern for possible urinary infection.  Neurogenic bladder history and very high risk for colonization.  He has not had a documented fever but has had chills for past couple days.  He does not tend to mount a prominent fever response.  #2 elevated blood pressure.  Past  history of hypertension but currently untreated.  #3 low testosterone  #4 hypothyroidism.  He is on replacement but has had some recent increase in tolerance of cold    Plan:     -Recheck TSH and total testosterone level -Urine culture sent -We talked many times the past about avoiding overtreatment of urine infections.  They are again very concerned because of the 2-day history of chills.  We wrote for Cipro 500 mg twice daily for 7 days pending culture results.  Consider urology referral.  We also recommend they consider changing his catheters more frequently -Monitor blood pressure and be in touch if not consistently below 140/90 over the next couple of weeks  [name] MD [facility]

## 2018-08-25 NOTE — Progress Notes (Signed)
Ine cultu

## 2018-08-28 LAB — URINE CULTURE
MICRO NUMBER:: 60226
SPECIMEN QUALITY:: ADEQUATE

## 2018-09-02 DIAGNOSIS — Z933 Colostomy status: Secondary | ICD-10-CM | POA: Diagnosis not present

## 2018-09-03 ENCOUNTER — Ambulatory Visit: Payer: Self-pay

## 2018-09-03 ENCOUNTER — Other Ambulatory Visit: Payer: Self-pay

## 2018-09-03 MED ORDER — LISINOPRIL 10 MG PO TABS: 10.0000 mg | ORAL_TABLET | Freq: Every day | ORAL | 1 refills | Status: DC

## 2018-09-03 NOTE — Telephone Encounter (Signed)
Called patient and spoke to wife Peter Congo and gave her the message from Dr. [name] and let her know that I have sent the medication to [facility] for her. Peter Congo verbalized an understanding. They have an appointment on 10/13/18.

## 2018-09-03 NOTE — Telephone Encounter (Signed)
Wife called to report she has been monitoring pt.'s BP as instructed: Last Thurs. - 153/83 Sat. - 162/75 Sun.  155/75  Mon. - 178/76 Tues. 171/82 Wed. 175/76 Thurs. 168/76 Urine has cleared up - pt. Finished antibiotic. Please advise in regard to elevated BP. No symptoms other than feels cold and having chills.     Answer Assessment - Initial Assessment Questions 1. BLOOD PRESSURE: "What is the blood pressure?" "Did you take at least two measurements 5 minutes apart?"     This morning  180/82 2. ONSET: "When did you take your blood pressure?"     This morning 3. HOW: "How did you obtain the blood pressure?" (e.g., visiting nurse, automatic home BP monitor)     Home BP monitor 4. HISTORY: "Do you have a history of high blood pressure?"     Yes 5. MEDICATIONS: "Are you taking any medications for blood pressure?" "Have you missed any doses recently?"     Not on medication now. 6. OTHER SYMPTOMS: "Do you have any symptoms?" (e.g., headache, chest pain, blurred vision, difficulty breathing, weakness)     No symptoms 7. PREGNANCY: "Is there any chance you are pregnant?" "When was your last menstrual period?"     n/a  Protocols used: HIGH BLOOD PRESSURE-A-AH

## 2018-09-03 NOTE — Telephone Encounter (Signed)
Please advise 

## 2018-09-03 NOTE — Telephone Encounter (Signed)
I would start back lisinopril 10 mg once daily and set up follow-up to reassess blood pressure within about 4 weeks

## 2018-09-08 ENCOUNTER — Other Ambulatory Visit: Payer: Self-pay | Admitting: Internal Medicine

## 2018-09-10 ENCOUNTER — Other Ambulatory Visit: Payer: PPO

## 2018-09-10 ENCOUNTER — Ambulatory Visit: Payer: PPO | Admitting: Oncology

## 2018-09-14 ENCOUNTER — Ambulatory Visit (INDEPENDENT_AMBULATORY_CARE_PROVIDER_SITE_OTHER): Payer: PPO | Admitting: Family Medicine

## 2018-09-14 ENCOUNTER — Telehealth: Payer: Self-pay

## 2018-09-14 ENCOUNTER — Encounter: Payer: Self-pay | Admitting: Family Medicine

## 2018-09-14 ENCOUNTER — Other Ambulatory Visit: Payer: Self-pay

## 2018-09-14 ENCOUNTER — Inpatient Hospital Stay: Payer: PPO | Attending: Oncology

## 2018-09-14 ENCOUNTER — Inpatient Hospital Stay (HOSPITAL_BASED_OUTPATIENT_CLINIC_OR_DEPARTMENT_OTHER): Payer: PPO | Admitting: Oncology

## 2018-09-14 VITALS — BP 148/82 | HR 64

## 2018-09-14 VITALS — BP 181/76 | HR 68 | Temp 97.6°F | Resp 17 | Ht 72.0 in

## 2018-09-14 DIAGNOSIS — N319 Neuromuscular dysfunction of bladder, unspecified: Secondary | ICD-10-CM

## 2018-09-14 DIAGNOSIS — R309 Painful micturition, unspecified: Secondary | ICD-10-CM

## 2018-09-14 DIAGNOSIS — Z87440 Personal history of urinary (tract) infections: Secondary | ICD-10-CM | POA: Insufficient documentation

## 2018-09-14 DIAGNOSIS — Z79899 Other long term (current) drug therapy: Secondary | ICD-10-CM

## 2018-09-14 DIAGNOSIS — C786 Secondary malignant neoplasm of retroperitoneum and peritoneum: Secondary | ICD-10-CM | POA: Insufficient documentation

## 2018-09-14 DIAGNOSIS — D509 Iron deficiency anemia, unspecified: Secondary | ICD-10-CM | POA: Diagnosis not present

## 2018-09-14 DIAGNOSIS — C649 Malignant neoplasm of unspecified kidney, except renal pelvis: Secondary | ICD-10-CM | POA: Insufficient documentation

## 2018-09-14 DIAGNOSIS — N390 Urinary tract infection, site not specified: Secondary | ICD-10-CM

## 2018-09-14 LAB — CMP (CANCER CENTER ONLY)
ALT: 24 U/L (ref 0–44)
AST: 17 U/L (ref 15–41)
Albumin: 3.1 g/dL — ABNORMAL LOW (ref 3.5–5.0)
Alkaline Phosphatase: 67 U/L (ref 38–126)
Anion gap: 9 (ref 5–15)
BUN: 28 mg/dL — ABNORMAL HIGH (ref 8–23)
CO2: 23 mmol/L (ref 22–32)
Calcium: 9.2 mg/dL (ref 8.9–10.3)
Chloride: 110 mmol/L (ref 98–111)
Creatinine: 1.14 mg/dL (ref 0.61–1.24)
GFR, Est AFR Am: >60 mL/min (ref >60)
GFR, Estimated: >60 mL/min (ref >60)
Glucose, Bld: 94 mg/dL (ref 70–99)
Potassium: 4.4 mmol/L (ref 3.5–5.1)
Sodium: 142 mmol/L (ref 135–145)
Total Bilirubin: 0.4 mg/dL (ref 0.3–1.2)
Total Protein: 7.9 g/dL (ref 6.5–8.1)

## 2018-09-14 LAB — CBC WITH DIFFERENTIAL (CANCER CENTER ONLY)
Abs Immature Granulocytes: 0.06 10*3/uL (ref 0.00–0.07)
BASOS ABS: 0.1 10*3/uL (ref 0.0–0.1)
BASOS PCT: 1 %
Eosinophils Absolute: 0.3 10*3/uL (ref 0.0–0.5)
Eosinophils Relative: 3 %
HCT: 44.2 % (ref 39.0–52.0)
Hemoglobin: 14 g/dL (ref 13.0–17.0)
Immature Granulocytes: 1 %
Lymphocytes Relative: 11 %
Lymphs Abs: 1 10*3/uL (ref 0.7–4.0)
MCH: 28.7 pg (ref 26.0–34.0)
MCHC: 31.7 g/dL (ref 30.0–36.0)
MCV: 90.8 fL (ref 80.0–100.0)
Monocytes Absolute: 1.1 10*3/uL — ABNORMAL HIGH (ref 0.1–1.0)
Monocytes Relative: 12 %
NRBC: 0 % (ref 0.0–0.2)
Neutro Abs: 6.5 10*3/uL (ref 1.7–7.7)
Neutrophils Relative %: 72 %
PLATELETS: 233 10*3/uL (ref 150–400)
RBC: 4.87 MIL/uL (ref 4.22–5.81)
RDW: 15.8 % — ABNORMAL HIGH (ref 11.5–15.5)
WBC Count: 9 10*3/uL (ref 4.0–10.5)

## 2018-09-14 LAB — POCT URINALYSIS DIPSTICK
Bilirubin, UA: NEGATIVE
Blood, UA: POSITIVE
Glucose, UA: NEGATIVE
Ketones, UA: NEGATIVE
Nitrite, UA: POSITIVE
PH UA: 6 (ref 5.0–8.0)
PROTEIN UA: POSITIVE — AB
Spec Grav, UA: 1.025 (ref 1.010–1.025)
UROBILINOGEN UA: 0.2 U/dL

## 2018-09-14 MED ORDER — CIPROFLOXACIN HCL 500 MG PO TABS: 500.0000 mg | ORAL_TABLET | Freq: Two times a day (BID) | ORAL | 0 refills | Status: DC

## 2018-09-14 MED ORDER — TAMSULOSIN HCL 0.4 MG PO CAPS: 0.4000 mg | ORAL_CAPSULE | Freq: Every day | ORAL | 3 refills | Status: DC

## 2018-09-14 NOTE — Patient Instructions (Signed)
We are setting up Urology referral.    Go ahead and start the Flomax one at night.

## 2018-09-14 NOTE — Telephone Encounter (Signed)
Printed avs and calender of upcoming appointment. Per 2/4 los

## 2018-09-14 NOTE — Progress Notes (Signed)
Hematology and Oncology Follow Up Visit  Austin [name] [id number] 09-26-1955 63 y.o. 09/14/2018 9:52 AM [name], Austin [name], MDBurchette, Austin Sierras, MD   Principle Diagnosis: 63 year old man with stage IV renal cell carcinoma diagnosed in 2015.  He has pulmonary and peritoneal involvement.  He initially presented with localized disease in 2013.   Prior Therapy:   1. He is status post robotic-assisted laparoscopic nephrectomy on the left done on 04/23/2012. The pathology revealed renal cell carcinoma with a grade 3/4 with the pathological staging of T3a.   2. Votrient 800 mg daily started around 12/27/2013. This was reduced to 400 mg subsequently and have been discontinued since 02/03/2014 due to poor tolerance.  Current therapy: Active surveillance.  Interim History: [name] is here for a follow-up.  Since last visit, he reports no major changes in his health.  He has experienced multiple urinary tract infections which is is not normal for him.  He denies any abdominal pain, chills or constitutional symptoms.  He denies any major changes in his quality of life and performance status.  He remains ambulatory with the help of a motorized scooter.  No recent hospitalizations or illnesses.  Patient denied any alteration mental status, neuropathy, confusion or dizziness.  Denies any headaches or lethargy.  Denies any night sweats, weight loss or changes in appetite.  Denied orthopnea, dyspnea on exertion or chest discomfort.  Denies shortness of breath, difficulty breathing hemoptysis or cough.  Denies any abdominal distention, nausea, early satiety or dyspepsia.  Denies any hematuria, frequency, dysuria or nocturia.  Denies any skin irritation, dryness or rash.  Denies any ecchymosis or petechiae.  Denies any lymphadenopathy or clotting.  Denies any heat or cold intolerance.  Denies any anxiety or depression.  Remaining review of system is negative.     Medications: I have reviewed the patient's  current medications.  Current Outpatient Medications  Medication Sig Dispense Refill  . acetaminophen (TYLENOL) 500 MG tablet Take 1 tablet (500 mg total) by mouth every 6 (six) hours as needed for mild pain, moderate pain, fever or headache. 30 tablet 0  . albuterol (PROVENTIL HFA;VENTOLIN HFA) 108 (90 BASE) MCG/ACT inhaler Inhale 2 puffs into the lungs every 6 (six) hours as needed for wheezing or shortness of breath. 1 Inhaler 0  . BD DISP NEEDLES 18G X 1-1/2" MISC INJECT TESTOSTERONE INTO THE MUSCLE AS DIRECTED EVERY 14 DAYS 6 each 66  . cetirizine (ZYRTEC) 10 MG tablet Take 10 mg by mouth daily with breakfast.    . ciprofloxacin (CIPRO) 500 MG tablet Take 1 tablet (500 mg total) by mouth 2 (two) times daily. 14 tablet 0  . escitalopram (LEXAPRO) 10 MG tablet TAKE 1 TABLET BY MOUTH IN THE MORNING 90 tablet 1  . furosemide (LASIX) 20 MG tablet Take 1 tablet (20 mg total) by mouth daily. (Patient taking differently: Take 20 mg by mouth daily as needed for fluid. ) 30 tablet 0  . ketoconazole (NIZORAL) 2 % cream APPLY AS NEEDED TO RASH (Patient taking differently: Apply to groin or legs daily as needed for rash) 60 g 1  . levothyroxine (SYNTHROID, LEVOTHROID) 25 MCG tablet Take 1 tablet (25 mcg total) by mouth daily before breakfast. 90 tablet 1  . lisinopril (PRINIVIL,ZESTRIL) 10 MG tablet Take 1 tablet (10 mg total) by mouth daily. 30 tablet 1  . Melatonin 3 MG TABS Take 3 mg by mouth at bedtime as needed (for sleep).    . Multiple Vitamin (MULTIVITAMIN WITH MINERALS) TABS  tablet Take 1 tablet by mouth daily with breakfast.     . pantoprazole (PROTONIX) 40 MG tablet TAKE 1 TABLET BY MOUTH ONCE DAILY 30 tablet 3  . Syringe/Needle, Disp, (SYRINGE 3CC/22GX1-1/2") 22G X 1-1/2" 3 ML MISC INJECT 1ML OF TESTOSTERONE EVERY 14 DAYS 6 each 66  . testosterone enanthate (DELATESTRYL) 200 MG/ML injection INJECT 1ML INTO THE MUSCLE EVERY 2 WEEKS 5 mL 0  . traMADol (ULTRAM) 50 MG tablet TAKE 1 TABLET BY MOUTH  EVERY 6 HOURS AS NEEDED. 60 tablet 0  . traZODone (DESYREL) 100 MG tablet TAKE 1 TABLET BY MOUTH AT BEDTIME 90 tablet 1  . triamcinolone cream (KENALOG) 0.1 % Apply 1 application topically 2 (two) times daily as needed (for rash). Applies to face     No current facility-administered medications for this visit.      Allergies:  Allergies  Allergen Reactions  . Penicillins Rash and Other (See Comments)    Has patient had a PCN reaction causing immediate rash, facial/tongue/throat swelling, SOB or lightheadedness with hypotension: YES Has patient had a PCN reaction causing severe rash involving mucus membranes or skin necrosis: No Has patient had a PCN reaction that required hospitalization No Has patient had a PCN reaction occurring within the last 10 years: Yes  If all of the above answers are "NO", then may proceed with Cephalosporin use.    Past Medical History, Surgical history, Social history, and Family History were reviewed and updated.    Physical Exam: Blood pressure (!) 181/76, pulse 68, temperature 97.6 F (36.4 C), temperature source Oral, resp. rate 17, height 6' (1.829 m), SpO2 100 %.   ECOG: 2   General appearance: Alert, awake without any distress. Head: Atraumatic without abnormalities Oropharynx: Without any thrush or ulcers. Eyes: No scleral icterus. Lymph nodes: No lymphadenopathy noted in the cervical, supraclavicular, or axillary nodes Heart:regular rate and rhythm, without any murmurs or gallops.   Lung: Clear to auscultation without any rhonchi, wheezes or dullness to percussion. Abdomin: Soft, nontender without any shifting dullness or ascites. Musculoskeletal: No clubbing or cyanosis. Neurological: No motor or sensory deficits. Skin: No rashes or lesions.     Lab Results: Lab Results  Component Value Date   WBC 9.0 09/14/2018   HGB 14.0 09/14/2018   HCT 44.2 09/14/2018   MCV 90.8 09/14/2018   PLT 233 09/14/2018     Chemistry       Component Value Date/Time   NA 138 05/12/2018 1049   NA 138 03/13/2017 0922   K 5.2 (H) 05/12/2018 1049   K 4.6 03/13/2017 0922   CL 109 05/12/2018 1049   CO2 22 05/12/2018 1049   CO2 24 03/13/2017 0922   BUN 28 (H) 05/12/2018 1049   BUN 27.4 (H) 03/13/2017 0922   CREATININE 1.21 05/12/2018 1049   CREATININE 0.87 06/05/2017 1643   CREATININE 1.3 03/13/2017 0922      Component Value Date/Time   CALCIUM 9.4 05/12/2018 1049   CALCIUM 9.3 03/13/2017 0922   ALKPHOS 87 05/12/2018 1049   ALKPHOS 91 03/13/2017 0922   AST 20 05/12/2018 1049   AST 17 03/13/2017 0922   ALT 36 05/12/2018 1049   ALT 21 03/13/2017 0922   BILITOT 0.4 05/12/2018 1049   BILITOT 0.27 03/13/2017 0922        Impression and Plan:  63 year old man with:  1.  Stage IV renal cell carcinoma with metastatic disease to the peritoneum noted in 2015.  He has opted not to receive treatment  and has been on active surveillance since that time.  He is tumor has been growing rather slowly and he is asymptomatic from his disease.  Risks and benefits of continuing this approach versus systemic treatment was reviewed today.  After discussion he opted to continue with active surveillance and will repeat imaging studies in 4 months.  2.  Recurrent urinary tract infection: The differential diagnosis was reviewed today.  Colonic fistula could be a possibility could also be related to intermittent catheterization although he has done that for over 20 years.  I recommended urological evaluation regarding this issue.  He has seen Dr. Tammi Klippel in the past.  3.  Iron deficiency anemia: Hemoglobin is back to normal at this time.  4. Prognosis: He has incurable disease although his performance status has not dramatically changed.  He continues to opt for supportive management.  5. Followup: In 4 months with repeat imaging studies.  15  minutes was spent with the patient face-to-face today.  More than 50% of time was dedicated to  discussing his disease status, laboratory data, differential diagnosis of recurrent urinary tract infections, treatment options and coordinating plan of care.   Zola Button, MD 2/4/20209:52 AM

## 2018-09-14 NOTE — Addendum Note (Signed)
Addended by: Scot Dock on: 09/14/2018 10:03 AM   Modules accepted: Orders

## 2018-09-14 NOTE — Progress Notes (Signed)
Subjective:     Patient ID: Austin [name], male   DOB: [birth year], 63 y.o.   MRN: [id number]  HPI Patient is seen with concern for recurrent UTI.  He has basically had about 1/month over the past year.  He has history of neurogenic bladder related to remote history of brainstem stroke several years ago.  They do in and out caths twice daily and seem to be doing these appropriately.  He has grown out Klebsiella pneumonia past few cultures.  Each time he is placed on Cipro he seems to do well.  Wife states that ever since he had colostomy secondary to perforation of sigmoid colon from diverticulitis back in September 2018 he has had more frequent UTIs.  Current symptoms include some mild burning, low-grade fever, and chills past few days.  His urine has been more dark in color and increased odor.  He has responded well to Cipro each time in the past.  They have noted that his urine stream seems to be weaker than it had previously and sometimes more of a dribble.  He does have metastatic renal carcinoma and has decided against any further chemotherapy treatments for that.  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 Brainstem stroke ([place]) 10/16/2008   Qualifier: Diagnosis of  By: [name] LPN, [name]    . C. difficile colitis 02/08/2017  . Cerebrovascular accident ([place]) 1994   hx of brainstem stroke, residual diminished lung capacity  . Chronic kidney disease    Left Renal Mass  . 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 Diverticulitis   . Duodenal ulcer   . GIB (gastrointestinal bleeding) 05/28/2017  . Hx of colonic polyps   . Hypertension    has been off BP meds since GI bleed was thought to have been caused by previously prescribed lisinopril   . Hypogonadism   . Intra-abdominal abscess ([place]) 01/23/2017  . Iron  deficiency anemia   . 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 Neuromuscular disorder ([place])    quadraplegic  . Open abdominal incision with drainage 01/26/2017  . Pituitary adenoma ([place])   . 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 Pneumonia   . PONV (postoperative nausea and vomiting)    vomited after colostomy placement   . Pre-diabetes    last a1c 5.6   . 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[place])   . Urinary incontinence   . Venous stasis    edema   Past Surgical History:  Procedure Laterality Date  . CHOLECYSTECTOMY    . COLON RESECTION N/A 05/04/2017   Procedure: LAPAROSCOPIC SIGMOID COLON RESECTION WITH END COLSOTOMY ERAS PATHWAY;  Surgeon: Alphonsa Overall, MD;  [facility]: [facility];  Service: General;  Laterality: N/A;  . COLONOSCOPY  02/27/2012   Procedure: COLONOSCOPY;  Surgeon: Jerene Bears, MD;  [facility];  Service: Gastroenterology;  Laterality: N/A;  . COLONOSCOPY N/A 05/31/2017   Procedure: COLONOSCOPY;  Surgeon: Milus Banister, MD;  [facility];  Service: Endoscopy;  Laterality: N/A;  . COLOSTOMY TAKEDOWN N/A 08/20/2017   Procedure: LAPAROSCOPIC ASSISTED REVISION END COLOSTOMY AND ENTEROLYSIS OF ADHESIONS;  Surgeon: Alphonsa Overall, MD;  [facility];  Service: General;  Laterality: N/A;  ERAS PATHWAY  . ESOPHAGOGASTRODUODENOSCOPY N/A 05/31/2017   Procedure: ESOPHAGOGASTRODUODENOSCOPY (EGD);  Surgeon: Milus Banister, MD;  [facility];  Service: Endoscopy;  Laterality: N/A;  . gamma knife radiation surgery  05/2010   for pituitary  . IR RADIOLOGIST EVAL & MGMT  02/12/2017  . IR RADIOLOGIST EVAL & MGMT  03/17/2017  . IR RADIOLOGIST EVAL & MGMT  04/14/2017  . IR RADIOLOGIST EVAL & MGMT  02/05/2017  . PITUITARY SURGERY  2007   nose approach  . ROBOT ASSISTED LAPAROSCOPIC NEPHRECTOMY  04/23/2012   Procedure: ROBOTIC ASSISTED LAPAROSCOPIC NEPHRECTOMY;  Surgeon: Alexis Frock, MD;  [facility];  Service: Urology;  Laterality: Left;  radical  . stomach peg  02/1993   removed 5-6 years later  . TRACHEOSTOMY TUBE PLACEMENT  02/1993   removed 04/1993  . UMBILICAL HERNIA REPAIR  04/23/2012   Procedure: HERNIA REPAIR UMBILICAL ADULT;  Surgeon: Alexis Frock, MD;  [facility];  Service: Urology;;  . [name]  . vocal cord surgery     injected with collagen, then fat from stomach to improve speech s/p stroke    reports that he quit smoking about 25 years ago. His smoking use included cigarettes. He has a 11.50 pack-year smoking history. He has never used smokeless tobacco. He reports that he does not drink alcohol or use drugs. family history includes Alcohol abuse in his father; Arthritis in his maternal grandmother and mother; Colon cancer in his brother; Esophageal cancer (age of onset: 63) in his father; Hyperlipidemia in his mother; Hypertension in his brother; Stroke in his paternal grandmother; Throat cancer in his father; Uterine cancer (age of onset: 37) in his mother. Allergies  Allergen Reactions  . Penicillins Rash and Other (See Comments)    Has patient had a PCN reaction causing immediate rash, facial/tongue/throat swelling, SOB or lightheadedness with hypotension: YES Has patient had a PCN reaction causing severe rash involving mucus membranes or skin necrosis: No Has patient had a PCN reaction that required hospitalization No Has patient had a PCN reaction occurring within the last 10 years: Yes  If all of the above answers are "NO", then may proceed with Cephalosporin use.     Review of Systems  Constitutional: Positive for chills, fatigue and fever.  Respiratory: Negative for shortness of breath.   Cardiovascular: Negative for chest pain.  Gastrointestinal: Negative for abdominal pain, nausea and vomiting.  Genitourinary: Positive for dysuria.       Objective:   Physical Exam Constitutional:      Appearance: Normal appearance.  Cardiovascular:      Rate and Rhythm: Normal rate and regular rhythm.  Genitourinary:    Comments: Rectal exam reveals no mass.  Normal sphincter tone.  Prostate is not enlarged but minimally tender. Neurological:     Mental Status: He is alert.        Assessment:     #1 recurrent UTI in a patient with neurogenic bladder related to brainstem stroke.  They have questions regarding whether there may be a leak with more frequent UTIs since his colostomy  #2 slow urinary stream    Plan:     -Urine culture sent -Start Cipro 500 mg twice daily for 7 days -Recommend trial of Flomax 0.4 mg nightly -Set up urology referral for further evaluation given his frequent and recurrent UTIs over the past year  [name] MD [facility]

## 2018-09-16 LAB — URINE CULTURE
MICRO NUMBER: 148823
SPECIMEN QUALITY: ADEQUATE

## 2018-09-17 DIAGNOSIS — R32 Unspecified urinary incontinence: Secondary | ICD-10-CM | POA: Diagnosis not present

## 2018-09-21 ENCOUNTER — Other Ambulatory Visit: Payer: Self-pay | Admitting: Family Medicine

## 2018-10-12 ENCOUNTER — Telehealth: Payer: Self-pay

## 2018-10-12 NOTE — Telephone Encounter (Signed)
Error

## 2018-10-12 NOTE — Progress Notes (Signed)
Subjective:   Austin [name] is a 63 y.o. male who presents for an Initial Medicare Annual Wellness Visit.  Review of Systems  No ROS.  Medicare Wellness Visit. Additional risk factors are reflected in the social history.  Cardiac Risk Factors include: diabetes mellitus;male gender;advanced age (>69mn, >>39women);sedentary lifestyle Sleep patterns: no sleep issues.   Home Safety/Smoke Alarms: Feels safe in home. Smoke alarms in place.  Living environment; residence and Firearm Safety: apartment. Has motorized w/c, w/c ramp, ADA accomodations within the home. No need for additional equipment.   Seat Belt Safety/Bike Helmet: Wears seat belt.   Male:   CCS- 05/2017, no need for follow-up per Dr. PWest Carbo      PSA-  Lab Results  Component Value Date   PSA 1.54 10/17/2013   PSA 0.83 08/19/2011   PSA 3.13 06/21/2010      Objective:    Today's Vitals   There is no height or weight on file to calculate BMI.  Advanced Directives 10/13/2018 08/25/2017 08/21/2017 08/20/2017 08/20/2017 06/16/2017 06/03/2017  Does Patient Have a Medical Advance Directive? Yes Yes Yes Yes Yes Yes Yes  Type of AParamedicof AGibbonLiving will [place]io del Mar[place] will Living will;Healthcare Power of Attorney Living will;Healthcare Power of Attorney Living will;Healthcare Power of [place]. [place] will  Does patient want to make changes to medical advance directive? No - Patient declined No - Patient declined No - Patient declined No - Patient declined No - Patient declined No - Patient declined -  Copy of H[place]facein Chart? No - copy requested No - copy requested No - copy requested - No - copy requested - Yes  Would patient like information on creating a medical advance directive? - No - Patient declined No - Patient declined - No - Patient declined - -  Pre-existing out of facility DNR order  (yellow form or pink MOST form) - - - - - - -    Current Medications (verified) Outpatient Encounter Medications as of 10/13/2018  Medication Sig  . acetaminophen (TYLENOL) 500 MG tablet Take 1 tablet (500 mg total) by mouth every 6 (six) hours as needed for mild pain, moderate pain, fever or headache.  . albuterol (PROVENTIL HFA;VENTOLIN HFA) 108 (90 BASE) MCG/ACT inhaler Inhale 2 puffs into the lungs every 6 (six) hours as needed for wheezing or shortness of breath.  . BD DISP NEEDLES 18G X 1-1/2" MISC INJECT TESTOSTERONE INTO THE MUSCLE AS DIRECTED EVERY 14 DAYS  . cetirizine (ZYRTEC) 10 MG tablet Take 10 mg by mouth daily with breakfast.  . escitalopram (LEXAPRO) 10 MG tablet TAKE 1 TABLET BY MOUTH IN THE MORNING  . furosemide (LASIX) 20 MG tablet Take 1 tablet (20 mg total) by mouth daily. (Patient taking differently: Take 20 mg by mouth daily as needed for fluid. )  . ketoconazole (NIZORAL) 2 % cream APPLY AS NEEDED TO RASH (Patient taking differently: Apply to groin or legs daily as needed for rash)  . levothyroxine (SYNTHROID, LEVOTHROID) 25 MCG tablet Take 1 tablet (25 mcg total) by mouth daily before breakfast.  . lisinopril (PRINIVIL,ZESTRIL) 10 MG tablet Take 1 tablet (10 mg total) by mouth daily.  . Melatonin 3 MG TABS Take 3 mg by mouth at bedtime as needed (for sleep).  . Multiple Vitamin (MULTIVITAMIN WITH MINERALS) TABS tablet Take 1 tablet by mouth daily with breakfast.   . pantoprazole (PROTONIX) 40 MG tablet  TAKE 1 TABLET BY MOUTH ONCE DAILY  . Syringe/Needle, Disp, (SYRINGE 3CC/22GX1-1/2") 22G X 1-1/2" 3 ML MISC INJECT 1ML OF TESTOSTERONE EVERY 14 DAYS  . tamsulosin (FLOMAX) 0.4 MG CAPS capsule Take 1 capsule (0.4 mg total) by mouth daily.  Marland Kitchen testosterone enanthate (DELATESTRYL) 200 MG/ML injection INJECT 1ML INTO THE MUSCLE EVERY 2 WEEKS  . traMADol (ULTRAM) 50 MG tablet TAKE 1 TABLET BY MOUTH EVERY 6 HOURS AS NEEDED.  Marland Kitchen traZODone (DESYREL) 100 MG tablet TAKE 1 TABLET BY  MOUTH AT BEDTIME  . triamcinolone cream (KENALOG) 0.1 % Apply 1 application topically 2 (two) times daily as needed (for rash). Applies to face  . [DISCONTINUED] ciprofloxacin (CIPRO) 500 MG tablet Take 1 tablet (500 mg total) by mouth 2 (two) times daily. (Patient not taking: Reported on 10/13/2018)   No facility-administered encounter medications on file as of 10/13/2018.     Allergies (verified) Penicillins   History: Past Medical History:  Diagnosis Date  . Brainstem stroke ([place]) 10/16/2008   Qualifier: Diagnosis of  By: [name] LPN, [name]    . C. difficile colitis 02/08/2017  . Cerebrovascular accident ([place]) 1994   hx of brainstem stroke, residual diminished lung capacity  . Chronic kidney disease    Left Renal Mass  . 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 Diverticulitis   . Duodenal ulcer   . GIB (gastrointestinal bleeding) 05/28/2017  . Hx of colonic polyps   . Hypertension    has been off BP meds since GI bleed was thought to have been caused by previously prescribed lisinopril   . Hypogonadism   . Intra-abdominal abscess ([place]) 01/23/2017  . Iron deficiency anemia   . 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 Neuromuscular disorder ([place])    quadraplegic  . Open abdominal incision with drainage 01/26/2017  . Pituitary adenoma ([place])   . 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 Pneumonia   . PONV (postoperative nausea and vomiting)    vomited after colostomy placement   . Pre-diabetes    last a1c 5.6   . 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[place])   . Urinary incontinence   . Venous stasis    edema   Past Surgical History:  Procedure Laterality Date  . CHOLECYSTECTOMY    . COLON RESECTION N/A 05/04/2017   Procedure: LAPAROSCOPIC SIGMOID COLON RESECTION WITH END COLSOTOMY ERAS PATHWAY;  Surgeon: Alphonsa Overall, MD;  [facility];  Service: General;  Laterality: N/A;  . COLONOSCOPY  02/27/2012   Procedure: COLONOSCOPY;  Surgeon:  Jerene Bears, MD;  [facility];  Service: Gastroenterology;  Laterality: N/A;  . COLONOSCOPY N/A 05/31/2017   Procedure: COLONOSCOPY;  Surgeon: Milus Banister, MD;  [facility];  Service: Endoscopy;  Laterality: N/A;  . COLOSTOMY TAKEDOWN N/A 08/20/2017   Procedure: LAPAROSCOPIC ASSISTED REVISION END COLOSTOMY AND ENTEROLYSIS OF ADHESIONS;  Surgeon: Alphonsa Overall, MD;  [facility];  Service: General;  Laterality: N/A;  ERAS PATHWAY  . ESOPHAGOGASTRODUODENOSCOPY N/A 05/31/2017   Procedure: ESOPHAGOGASTRODUODENOSCOPY (EGD);  Surgeon: Milus Banister, MD;  Location: [facility];  Service: Endoscopy;  Laterality: N/A;  . gamma knife radiation surgery  05/2010   for pituitary  . IR RADIOLOGIST EVAL & MGMT  02/12/2017  . IR RADIOLOGIST EVAL & MGMT  03/17/2017  . IR RADIOLOGIST EVAL & MGMT  04/14/2017  . IR RADIOLOGIST EVAL & MGMT  02/05/2017  . PITUITARY SURGERY  2007  nose approach  . ROBOT ASSISTED LAPAROSCOPIC NEPHRECTOMY  04/23/2012   Procedure: ROBOTIC ASSISTED LAPAROSCOPIC NEPHRECTOMY;  Surgeon: Alexis Frock, MD;  [facility];  Service: Urology;  Laterality: Left;  radical  . stomach peg  02/1993   removed 5-6 years later  . TRACHEOSTOMY TUBE PLACEMENT  02/1993   removed 04/1993  . UMBILICAL HERNIA REPAIR  04/23/2012   Procedure: HERNIA REPAIR UMBILICAL ADULT;  Surgeon: Alexis Frock, MD;  [facility];  Service: Urology;;  . [name]  . vocal cord surgery     injected with collagen, then fat from stomach to improve speech s/p stroke   Family History  Problem Relation Age of Onset  . Alcohol abuse Father   . Throat cancer Father   . Esophageal cancer Father 67  . Arthritis Mother   . Hyperlipidemia Mother   . Uterine cancer Mother 5  . Colon cancer Brother   . Arthritis Maternal Grandmother   . Hypertension Brother   . Stroke Paternal Grandmother    Social History   Socioeconomic History  . Marital status: Married    Spouse name:  Not on file  . Number of children: 3  . Years of education: Not on file  . Highest education level: Not on file  Occupational History  . Occupation: disabled    Fish farm manager: UNEMPLOYED    Employer: [employer]    Comment: Retired  . 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 Financial resource strain: Not hard at all  . Food insecurity:    Worry: Never true    Inability: Never true  . Transportation needs:    Medical: No    Non-medical: No  Tobacco Use  . Smoking status: Former Smoker    Packs/day: 0.50    Years: 23.00    Pack years: 11.50    Types: Cigarettes    Last attempt to quit: 09/24/1992    Years since quitting: 26.0  . Smokeless tobacco: Never Used  Substance and Sexual Activity  . Alcohol use: No    Comment: rare  . Drug use: No  . Sexual activity: Not on file  Lifestyle  . Physical activity:    Days per week: 0 days    Minutes per session: 0 min  . Stress: Not at all  Relationships  . Social connections:    Talks on phone: More than three times a week    Gets together: Once a week    Attends religious service: More than 4 times per year    Active member of club or organization: No    Attends meetings of clubs or organizations: Never    Relationship status: Married  Other Topics Concern  . Not on file  Social History Narrative   Retired, disabled 1994 Quadreplegic      10/13/2018:   Lives with wife on one level apartment. Wife Peter Congo is main caregiver.   Has 2 daughters, 1 son, 6 grandchildren, all of whom live close by and are supportive   Attends church, active with church community with Peter Congo   Enjoys working on the computer, going outside in his motorized wheelchair   Tobacco Counseling Counseling given: Not Answered   Activities of Daily Living In your present state of health, do you have any difficulty performing the following activities: 10/13/2018 10/13/2018  Hearing? N N  Vision? N N    Difficulty concentrating or making decisions? N N  Walking or climbing  stairs? Y N  Comment quadriplegic -  Dressing or bathing? Y N  Doing errands, shopping? Y N  Preparing Food and eating ? Y -  Using the Toilet? Y -  In the past six months, have you accidently leaked urine? Y -  Comment being treated for recurrent UTI -  Do you have problems with loss of bowel control? Y -  Managing your Medications? Y -  Managing your Finances? Y -  Housekeeping or managing your Housekeeping? Y -  Comment wife handles all; respite services not needed, but resources provided in event wife feels she needs additional assistance. -  Some recent data might be hidden     Immunizations and Health Maintenance Immunization History  Administered Date(s) Administered  . Influenza Split 05/27/2011, 05/31/2012  . Influenza Whole 05/14/2009, 05/21/2010  . Influenza,inj,Quad PF,6+ Mos 04/13/2013, 06/01/2014, 05/23/2015, 06/18/2016, 04/07/2017, 04/14/2018  . Pneumococcal Polysaccharide-23 04/16/2004  . Td 11/12/2009   Health Maintenance Due  Topic Date Due  . Hepatitis C Screening  March 02, 1956  . HEMOGLOBIN A1C  10/13/2018    Patient Care Team: [name], MD as PCP - General Alen Blew, Mathis Dad, MD as Consulting Physician (Oncology) Pyrtle, Lajuan Lines, MD as Consulting Physician (Gastroenterology) Alphonsa Overall, MD as Consulting Physician (General Surgery)  Indicate any recent Medical Services you may have received from other than [facility] providers in the past year (date may be approximate).    Assessment:   This is a routine wellness examination for Austin [name]. Physical assessment deferred to PCP.   Hearing/Vision screen Hearing Screening Comments: Able to hear conversational tones w/o difficulty. No issues reported.   Declined hearing screen at this time. Vision Screening Comments: Wears glasses, but no acute vision concerns. Declined vision exam at this time.   Dietary issues and exercise  activities discussed: Current Exercise Habits: The patient does not participate in regular exercise at present, Exercise limited by: neurologic condition(s);respiratory conditions(s) Diet (meal preparation, eat out, water intake, caffeinated beverages, dairy products, fruits and vegetables): in general, a "healthy" diet  . Good appetite. States he stays hydrated. Drinks coffee/tea daily. Author encouraged pt. to limit coffee and tea during periods of UTI as these can be bladder irritants, and pt. and wife at side verbalized understanding.        Goals    . Patient Stated     Get UTI and coughing under control! Keep staying active with family!      Depression Screen PHQ 2/9 Scores 10/13/2018 10/13/2018 08/25/2017 06/19/2017  PHQ - 2 Score 1 0 0 0  PHQ- 9 Score 2 0 - -    Much of the visit centered around talking about pt's support system, including family and church community. Wife served as pt's interpreter for much of the visit, and demonstrated great care and attentiveness to pt. Pt. Loved talking about his grandchildren, retelling stories of how he likes to give them rides on his motorized chair, read them stories, face-time with those who still live in [place]. Despite periods of feeling down, pt. States he is doing OK considering all of his co-morbidities and current issues with UTI and URI.  Fall Risk Fall Risk  10/13/2018 08/25/2017 06/19/2017 06/03/2017 02/19/2017  Falls in the past year? 0 No No No No  Risk for fall due to : Impaired balance/gait;History of fall(s);Impaired mobility;Impaired vision - - - -  Risk for fall due to: Comment - - - - -      Cognitive Function:  Ad8 score reviewed for issues:  Issues making decisions: no  Less interest in hobbies / activities: no  Repeats questions, stories (family complaining): no  Trouble using ordinary gadgets (microwave, computer, phone):no  Forgets the month or year: no  Mismanaging finances: no  Remembering appts:  no  Daily problems with thinking and/or memory: no Ad8 score is= 0  Screening Tests Health Maintenance  Topic Date Due  . Hepatitis C Screening  13-Dec-1955  . HEMOGLOBIN A1C  10/13/2018  . FOOT EXAM  12/08/2028 (Originally 10/18/2014)  . OPHTHALMOLOGY EXAM  02/23/2019  . TETANUS/TDAP  11/13/2019  . COLONOSCOPY  06/01/2027  . INFLUENZA VACCINE  Completed  . PNEUMOCOCCAL POLYSACCHARIDE VACCINE AGE 16-64 HIGH RISK  Completed  . HIV Screening  Completed    Qualifies for Shingles Vaccine? Yes, advised to receive at local pharmacy.       Plan:    Refer to respite care resources  Consider getting shingles vaccine (shingrix) at local pharmacy.  I have personally reviewed and noted the following in the patient's chart:   . Medical and social history . Use of alcohol, tobacco or illicit drugs  . Current medications and supplements . Functional ability and status . Nutritional status . Physical activity . Advanced directives . List of other physicians . Hospitalizations, surgeries, and ER visits in previous 12 months . Vitals . Screenings to include cognitive, depression, and falls . Referrals and appointments  In addition, I have reviewed and discussed with patient certain preventive protocols, quality metrics, and best practice recommendations. A written personalized care plan for preventive services as well as general preventive health recommendations were provided to patient.     Alphia Moh, RN   10/13/2018

## 2018-10-13 ENCOUNTER — Ambulatory Visit (INDEPENDENT_AMBULATORY_CARE_PROVIDER_SITE_OTHER): Payer: PPO

## 2018-10-13 ENCOUNTER — Other Ambulatory Visit: Payer: Self-pay

## 2018-10-13 ENCOUNTER — Ambulatory Visit (INDEPENDENT_AMBULATORY_CARE_PROVIDER_SITE_OTHER): Payer: PPO | Admitting: Family Medicine

## 2018-10-13 ENCOUNTER — Encounter: Payer: Self-pay | Admitting: Family Medicine

## 2018-10-13 ENCOUNTER — Other Ambulatory Visit: Payer: Self-pay | Admitting: Family Medicine

## 2018-10-13 VITALS — BP 142/92 | HR 63 | Temp 97.7°F

## 2018-10-13 DIAGNOSIS — N3001 Acute cystitis with hematuria: Secondary | ICD-10-CM

## 2018-10-13 DIAGNOSIS — Z0000 Encounter for general adult medical examination without abnormal findings: Secondary | ICD-10-CM | POA: Diagnosis not present

## 2018-10-13 DIAGNOSIS — I10 Essential (primary) hypertension: Secondary | ICD-10-CM

## 2018-10-13 DIAGNOSIS — E119 Type 2 diabetes mellitus without complications: Secondary | ICD-10-CM

## 2018-10-13 DIAGNOSIS — R309 Painful micturition, unspecified: Secondary | ICD-10-CM | POA: Diagnosis not present

## 2018-10-13 LAB — POCT URINALYSIS DIPSTICK
Bilirubin, UA: NEGATIVE
Blood, UA: POSITIVE
Glucose, UA: NEGATIVE
KETONES UA: NEGATIVE
Nitrite, UA: POSITIVE
Protein, UA: POSITIVE — AB
SPEC GRAV UA: 1.015 (ref 1.010–1.025)
Urobilinogen, UA: 0.2 E.U./dL
pH, UA: 6 (ref 5.0–8.0)

## 2018-10-13 LAB — POCT GLYCOSYLATED HEMOGLOBIN (HGB A1C): Hemoglobin A1C: 5.9 % — AB (ref 4.0–5.6)

## 2018-10-13 MED ORDER — LEVOFLOXACIN 500 MG PO TABS: 500.0000 mg | ORAL_TABLET | Freq: Every day | ORAL | 0 refills | Status: DC

## 2018-10-13 NOTE — Patient Instructions (Addendum)
Refer to respite care resources  Consider getting shingles vaccine (shingrix) at local pharmacy.   [name] , Thank you for taking time to come for your Medicare Wellness Visit. I appreciate your ongoing commitment to your health goals. Please review the following plan we discussed and let me know if I can assist you in the future.   These are the goals we discussed: Goals    . Patient Stated     Get UTI and coughing under control! Keep staying active with family!       This is a list of the screening recommended for you and due dates:  Health Maintenance  Topic Date Due  .  Hepatitis C: One time screening is recommended by Center for Disease Control  (CDC) for  adults born from [birth year] through 1966.   Jun 01, 1956  . Hemoglobin A1C  10/13/2018  . Complete foot exam   12/08/2028*  . Eye exam for diabetics  02/23/2019  . Tetanus Vaccine  11/13/2019  . Colon Cancer Screening  06/01/2027  . Flu Shot  Completed  . Pneumococcal vaccine  Completed  . HIV Screening  Completed  *Topic was postponed. The date shown is not the original due date.     Health Maintenance, Male A healthy lifestyle and preventive care is important for your health and wellness. Ask your health care provider about what schedule of regular examinations is right for you. What should I know about weight and diet? Eat a Healthy Diet  Eat plenty of vegetables, fruits, whole grains, low-fat dairy products, and lean protein.  Do not eat a lot of foods high in solid fats, added sugars, or salt.  Maintain a Healthy Weight Regular exercise can help you achieve or maintain a healthy weight. You should:  Do at least 150 minutes of exercise each week. The exercise should increase your heart rate and make you sweat (moderate-intensity exercise).  Do strength-training exercises at least twice a week. Watch Your Levels of Cholesterol and Blood Lipids  Have your blood tested for lipids and cholesterol every 5 years  starting at 64 years of age. If you are at high risk for heart disease, you should start having your blood tested when you are 64 years old. You may need to have your cholesterol levels checked more often if: ? Your lipid or cholesterol levels are high. ? You are older than 64 years of age. ? You are at high risk for heart disease. What should I know about cancer screening? Many types of cancers can be detected early and may often be prevented. Lung Cancer  You should be screened every year for lung cancer if: ? You are a current smoker who has smoked for at least 30 years. ? You are a former smoker who has quit within the past 15 years.  Talk to your health care provider about your screening options, when you should start screening, and how often you should be screened. Colorectal Cancer  Routine colorectal cancer screening usually begins at 64 years of age and should be repeated every 5-10 years until you are 64 years old. You may need to be screened more often if early forms of precancerous polyps or small growths are found. Your health care provider may recommend screening at an earlier age if you have risk factors for colon cancer.  Your health care provider may recommend using home test kits to check for hidden blood in the stool.  A small camera at the end of a  tube can be used to examine your colon (sigmoidoscopy or colonoscopy). This checks for the earliest forms of colorectal cancer. Prostate and Testicular Cancer  Depending on your age and overall health, your health care provider may do certain tests to screen for prostate and testicular cancer.  Talk to your health care provider about any symptoms or concerns you have about testicular or prostate cancer. Skin Cancer  Check your skin from head to toe regularly.  Tell your health care provider about any new moles or changes in moles, especially if: ? There is a change in a mole's size, shape, or color. ? You have a mole that  is larger than a pencil eraser.  Always use sunscreen. Apply sunscreen liberally and repeat throughout the day.  Protect yourself by wearing long sleeves, pants, a wide-brimmed hat, and sunglasses when outside. What should I know about heart disease, diabetes, and high blood pressure?  If you are 61-63 years of age, have your blood pressure checked every 3-5 years. If you are 54 years of age or older, have your blood pressure checked every year. You should have your blood pressure measured twice-once when you are at a hospital or clinic, and once when you are not at a hospital or clinic. Record the average of the two measurements. To check your blood pressure when you are not at a hospital or clinic, you can use: ? An automated blood pressure machine at a pharmacy. ? A home blood pressure monitor.  Talk to your health care provider about your target blood pressure.  If you are between 64-96 years old, ask your health care provider if you should take aspirin to prevent heart disease.  Have regular diabetes screenings by checking your fasting blood sugar level. ? If you are at a normal weight and have a low risk for diabetes, have this test once every three years after the age of 32. ? If you are overweight and have a high risk for diabetes, consider being tested at a younger age or more often.  A one-time screening for abdominal aortic aneurysm (AAA) by ultrasound is recommended for men aged 53-75 years who are current or former smokers. What should I know about preventing infection? Hepatitis B If you have a higher risk for hepatitis B, you should be screened for this virus. Talk with your health care provider to find out if you are at risk for hepatitis B infection. Hepatitis C Blood testing is recommended for:  Everyone born from [birth year] through 1966.  Anyone with known risk factors for hepatitis C. Sexually Transmitted Diseases (STDs)  You should be screened each year for STDs  including gonorrhea and chlamydia if: ? You are sexually active and are younger than 64 years of age. ? You are older than 64 years of age and your health care provider tells you that you are at risk for this type of infection. ? Your sexual activity has changed since you were last screened and you are at an increased risk for chlamydia or gonorrhea. Ask your health care provider if you are at risk.  Talk with your health care provider about whether you are at high risk of being infected with HIV. Your health care provider may recommend a prescription medicine to help prevent HIV infection. What else can I do?  Schedule regular health, dental, and eye exams.  Stay current with your vaccines (immunizations).  Do not use any tobacco products, such as cigarettes, chewing tobacco, and e-cigarettes. If you need  help quitting, ask your health care provider.  Limit alcohol intake to no more than 2 drinks per day. One drink equals 12 ounces of beer, 5 ounces of wine, or 1 ounces of hard liquor.  Do not use street drugs.  Do not share needles.  Ask your health care provider for help if you need support or information about quitting drugs.  Tell your health care provider if you often feel depressed.  Tell your health care provider if you have ever been abused or do not feel safe at home. This information is not intended to replace advice given to you by your health care provider. Make sure you discuss any questions you have with your health care provider. Document Released: 01/24/2008 Document Revised: 03/26/2016 Document Reviewed: 05/01/2015 Elsevier Interactive Patient Education  2019 [employer].

## 2018-10-13 NOTE — Progress Notes (Signed)
Subjective:     Patient ID: Austin [name], male   DOB: [birth year], 63 y.o.   MRN: [id number]  HPI Patient is here for medical follow-up.  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  His diabetes has been well controlled.  A1c today 5.9%.  Frequent UTIs.  We have suspected possible colonization.  He had 3 cultures successive that grew out Klebsiella but most recent culture grew out Serratia marcescens.  He has had some chills but no fever.  Cloudy urine with foul odor.  We started Flomax and they did notice some increase in his urinary stream after starting that.  They still do in and out caths couple times daily.  Has pending urology follow up    He has also had some cough and upper airway congestion past several days.  No documented fever.  He does have metastatic renal cell with multiple pulmonary nodules noted on most recent CT which was back in October.  No hemoptysis.  Past Medical History:  Diagnosis Date  . Brainstem stroke ([place]) 10/16/2008   Qualifier: Diagnosis of  By: Austin [name]    . C. difficile colitis 02/08/2017  . Cerebrovascular accident ([place]) 1994   hx of brainstem stroke, residual diminished lung capacity  . Chronic kidney disease    Left Renal Mass  . 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 Diverticulitis   . Duodenal ulcer   . GIB (gastrointestinal bleeding) 05/28/2017  . Hx of colonic polyps   . Hypertension    has been off BP meds since GI bleed was thought to have been caused by previously prescribed lisinopril   . Hypogonadism   . Intra-abdominal abscess ([place]) 01/23/2017  . Iron deficiency anemia   . 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 Neuromuscular disorder ([place])    quadraplegic  . Open  abdominal incision with drainage 01/26/2017  . Pituitary adenoma ([place])   . 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 Pneumonia   . PONV (postoperative nausea and vomiting)    vomited after colostomy placement   . Pre-diabetes    last a1c 5.6   . 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[place])   . Urinary incontinence   . Venous stasis    edema   Past Surgical History:  Procedure Laterality Date  . CHOLECYSTECTOMY    . COLON RESECTION N/A 05/04/2017   Procedure: LAPAROSCOPIC SIGMOID COLON RESECTION WITH END COLSOTOMY ERAS PATHWAY;  Surgeon: Austin Overall, MD;  Location: [facility];  Service: General;  Laterality: N/A;  . COLONOSCOPY  02/27/2012   Procedure: COLONOSCOPY;  Surgeon: Austin Bears, MD;  Location: [facility];  Service: Gastroenterology;  Laterality: N/A;  . COLONOSCOPY N/A 05/31/2017   Procedure: COLONOSCOPY;  Surgeon: Austin Banister, MD;  Location: [facility];  Service: Endoscopy;  Laterality: N/A;  . COLOSTOMY TAKEDOWN N/A 08/20/2017   Procedure: LAPAROSCOPIC ASSISTED REVISION END COLOSTOMY AND ENTEROLYSIS OF ADHESIONS;  Surgeon: Austin Overall, MD;  Location: [facility];  Service: General;  Laterality: N/A;  ERAS PATHWAY  . ESOPHAGOGASTRODUODENOSCOPY N/A 05/31/2017   Procedure: ESOPHAGOGASTRODUODENOSCOPY (EGD);  Surgeon: Austin Banister, MD;  Location: [facility];  Service: Endoscopy;  Laterality: N/A;  . gamma knife radiation surgery  05/2010   for pituitary  . IR RADIOLOGIST EVAL & MGMT  02/12/2017  .  IR RADIOLOGIST EVAL & MGMT  03/17/2017  . IR RADIOLOGIST EVAL & MGMT  04/14/2017  . IR RADIOLOGIST EVAL & MGMT  02/05/2017  . PITUITARY SURGERY  2007   nose approach  . ROBOT ASSISTED LAPAROSCOPIC NEPHRECTOMY  04/23/2012   Procedure: ROBOTIC ASSISTED LAPAROSCOPIC NEPHRECTOMY;  Surgeon: Austin Frock, MD;  Location: [facility];  Service: Urology;  Laterality: Left;  radical  . stomach peg  02/1993   removed 5-6 years later  . TRACHEOSTOMY TUBE PLACEMENT  02/1993   removed  04/1993  . UMBILICAL HERNIA REPAIR  04/23/2012   Procedure: HERNIA REPAIR UMBILICAL ADULT;  Surgeon: Austin Frock, MD;  Location: [facility];  Service: Urology;;  . Austin [name]  . vocal cord surgery     injected with collagen, then fat from stomach to improve speech s/p stroke    reports that he quit smoking about 26 years ago. His smoking use included cigarettes. He has a 11.50 pack-year smoking history. He has never used smokeless tobacco. He reports that he does not drink alcohol or use drugs. family history includes Alcohol abuse in his father; Arthritis in his maternal grandmother and mother; Colon cancer in his brother; Esophageal cancer (age of onset: 78) in his father; Hyperlipidemia in his mother; Hypertension in his brother; Stroke in his paternal grandmother; Throat cancer in his father; Uterine cancer (age of onset: 42) in his mother. Allergies  Allergen Reactions  . Penicillins Rash and Other (See Comments)    Has patient had a PCN reaction causing immediate rash, facial/tongue/throat swelling, SOB or lightheadedness with hypotension: YES Has patient had a PCN reaction causing severe rash involving mucus membranes or skin necrosis: No Has patient had a PCN reaction that required hospitalization No Has patient had a PCN reaction occurring within the last 10 years: Yes  If all of the above answers are "NO", then may proceed with Cephalosporin use.     Review of Systems  Constitutional: Positive for chills and fatigue. Negative for fever.  HENT: Positive for congestion.   Respiratory: Positive for cough.   Cardiovascular: Negative for chest pain.  Gastrointestinal: Negative for abdominal pain.       Objective:   Physical Exam Constitutional:      Appearance: Normal appearance.  Cardiovascular:     Rate and Rhythm: Normal rate and regular rhythm.  Pulmonary:     Effort: Pulmonary effort is normal.     Comments: Diminished breath sounds right base compared with left.  No  rales.  No wheezes. Neurological:     Mental Status: He is alert.        Assessment:     #1 type 2 diabetes well-controlled with A1c now 5.9%  #2 history of neurogenic bladder.  Frequent UTI.  Urine dipstick today reveals 3+ leukocytes, 2+ blood, positive nitrites.  He has had symptoms of chills but no documented fever.  Cloudy urine.  #3 hypertension.  Slightly up today but generally this has been well controlled  #4 metastatic renal cell carcinoma.  They have decided against any further chemotherapy    Plan:     -Urine culture sent -Start Levaquin 500 mg once daily for 7 days.  They have pending urology follow-up -Suggested over-the-counter plain Mucinex -Plan routine follow-up in 3 months and sooner as needed  [name] MD [facility]

## 2018-10-15 LAB — URINE CULTURE
MICRO NUMBER:: 276190
SPECIMEN QUALITY:: ADEQUATE

## 2018-10-19 ENCOUNTER — Other Ambulatory Visit: Payer: Self-pay

## 2018-10-19 MED ORDER — SULFAMETHOXAZOLE-TRIMETHOPRIM 800-160 MG PO TABS: 1.0000 | ORAL_TABLET | Freq: Two times a day (BID) | ORAL | 0 refills | Status: DC

## 2018-10-20 DIAGNOSIS — R32 Unspecified urinary incontinence: Secondary | ICD-10-CM | POA: Diagnosis not present

## 2018-10-22 ENCOUNTER — Telehealth: Payer: Self-pay

## 2018-10-22 NOTE — Telephone Encounter (Signed)
Please see message. Please advise.  Copied from [place] [phone]. Topic: General - Other >> Oct 21, 2018  3:56 PM Alanda Slim E wrote: Reason for CRM: PA Jacqualine Code from [facility] called to advise that he seen the patient today for an urgent house call. If need notes or info please call

## 2018-10-22 NOTE — Telephone Encounter (Signed)
Is this a FYI note??   I don't see anything from the brief note to act on.

## 2018-10-25 DIAGNOSIS — Z515 Encounter for palliative care: Secondary | ICD-10-CM | POA: Diagnosis not present

## 2018-10-25 DIAGNOSIS — N3020 Other chronic cystitis without hematuria: Secondary | ICD-10-CM | POA: Diagnosis not present

## 2018-10-25 DIAGNOSIS — N319 Neuromuscular dysfunction of bladder, unspecified: Secondary | ICD-10-CM | POA: Diagnosis not present

## 2018-10-25 DIAGNOSIS — C642 Malignant neoplasm of left kidney, except renal pelvis: Secondary | ICD-10-CM | POA: Diagnosis not present

## 2018-10-25 DIAGNOSIS — C778 Secondary and unspecified malignant neoplasm of lymph nodes of multiple regions: Secondary | ICD-10-CM | POA: Diagnosis not present

## 2018-10-25 DIAGNOSIS — C787 Secondary malignant neoplasm of liver and intrahepatic bile duct: Secondary | ICD-10-CM | POA: Diagnosis not present

## 2018-10-26 ENCOUNTER — Other Ambulatory Visit: Payer: Self-pay | Admitting: Family Medicine

## 2018-10-26 NOTE — Telephone Encounter (Signed)
Author phoned pt. To check in, and wife answered, stating that [facility] had visited for initial assessment, but nothing urgent that she knew of. Pt. saw urologist yesterday, recommending more frequent catheterizations. Otherwise, no new changes or concerns. Routed to PCP as FYI.

## 2018-10-29 ENCOUNTER — Other Ambulatory Visit: Payer: Self-pay

## 2018-10-29 MED ORDER — LISINOPRIL 10 MG PO TABS: 10.0000 mg | ORAL_TABLET | Freq: Every day | ORAL | 1 refills | Status: DC

## 2018-11-17 DIAGNOSIS — I10 Essential (primary) hypertension: Secondary | ICD-10-CM | POA: Diagnosis not present

## 2018-11-17 DIAGNOSIS — G4733 Obstructive sleep apnea (adult) (pediatric): Secondary | ICD-10-CM | POA: Diagnosis not present

## 2018-11-17 DIAGNOSIS — R0600 Dyspnea, unspecified: Secondary | ICD-10-CM | POA: Diagnosis not present

## 2018-11-17 DIAGNOSIS — M5416 Radiculopathy, lumbar region: Secondary | ICD-10-CM | POA: Diagnosis not present

## 2018-11-17 DIAGNOSIS — Z933 Colostomy status: Secondary | ICD-10-CM | POA: Diagnosis not present

## 2018-11-17 DIAGNOSIS — J4520 Mild intermittent asthma, uncomplicated: Secondary | ICD-10-CM | POA: Diagnosis not present

## 2018-11-18 DIAGNOSIS — R32 Unspecified urinary incontinence: Secondary | ICD-10-CM | POA: Diagnosis not present

## 2018-11-18 DIAGNOSIS — C44519 Basal cell carcinoma of skin of other part of trunk: Secondary | ICD-10-CM | POA: Diagnosis not present

## 2018-12-08 IMAGING — CT CT CHEST W/ CM
3 of 16 series · 10 of 46 positions shown, 16 images · IV contrast (omnipaque)
Comparison: Multiple prior CT scans from 2924.

CLINICAL DATA: Followup metastatic renal cell carcinoma.

EXAM:
CT CHEST WITH CONTRAST
CT ABDOMEN AND PELVIS WITH AND WITHOUT CONTRAST
TECHNIQUE: Multidetector CT imaging of the chest was performed during
intravenous contrast administration. Multidetector CT imaging of the
abdomen and pelvis was performed following the standard protocol
before and during bolus administration of intravenous contrast.
CONTRAST:  100mL OMNIPAQUE IOHEXOL 300 MG/ML  SOLN

[Series 2: axial pre · axial · non-contrast · 0.81mm/px · z∈[-192,-117]mm · 2 of 99 slices shown]
[im 25/99  soft-tissue]
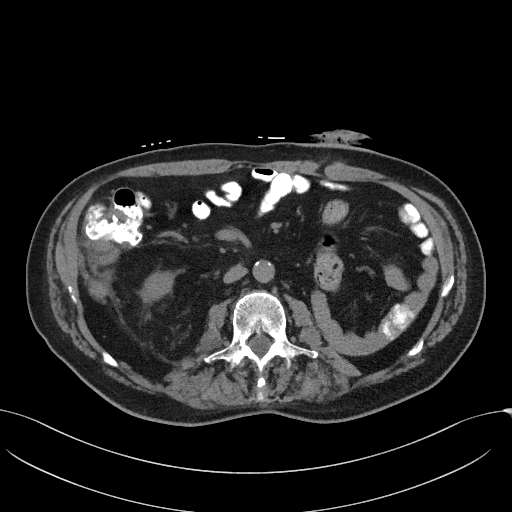
[im 50/99  soft-tissue]
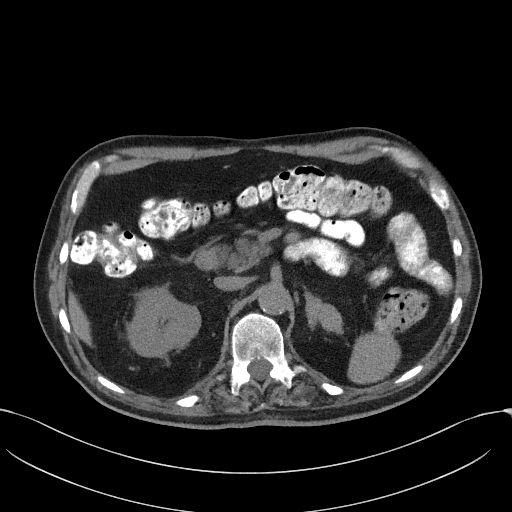

[Series 9: coronal arterial · coronal · arterial · 0.56mm/px · 1 of 98 slices shown, 2 images]
[im 49/98  soft-tissue]
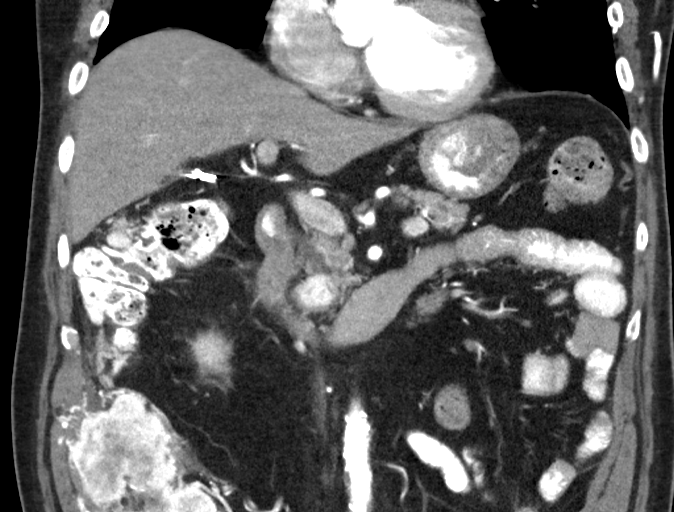
[im 49/98  bone]
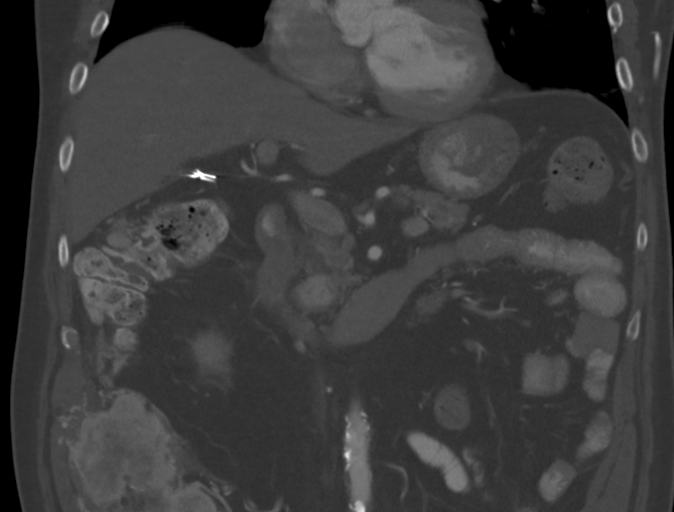

[Series 11: axial nephro · axial · 0.76mm/px · z∈[-337,+119]mm · 7 of 204 slices shown, 12 images]
[im 26/204  soft-tissue]
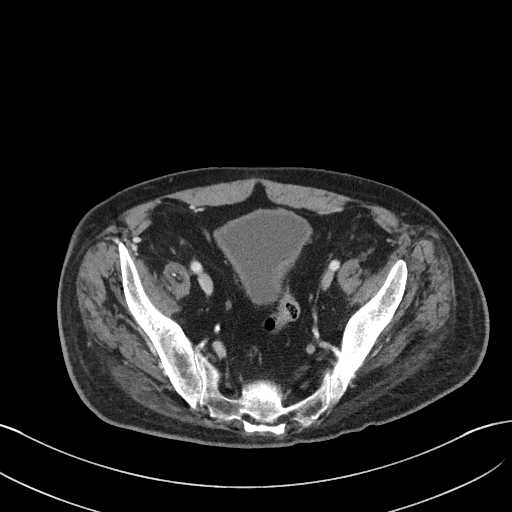
[im 26/204  bone]
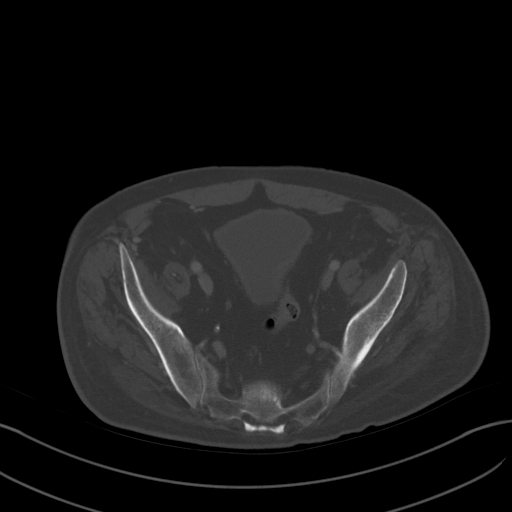
[im 51/204  soft-tissue]
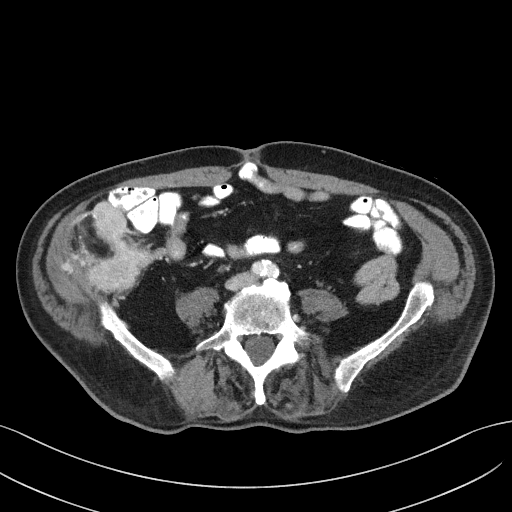
[im 77/204  soft-tissue]
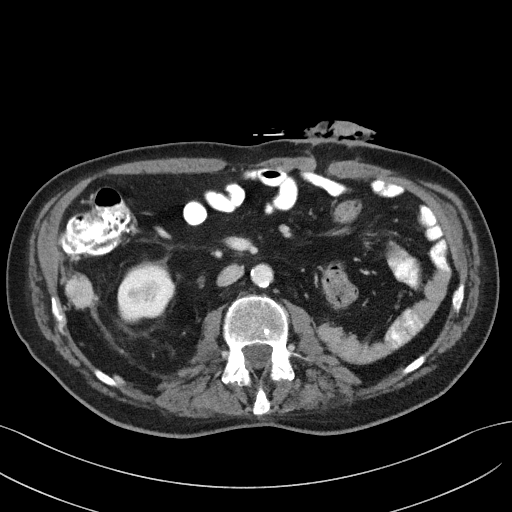
[im 102/204  soft-tissue]
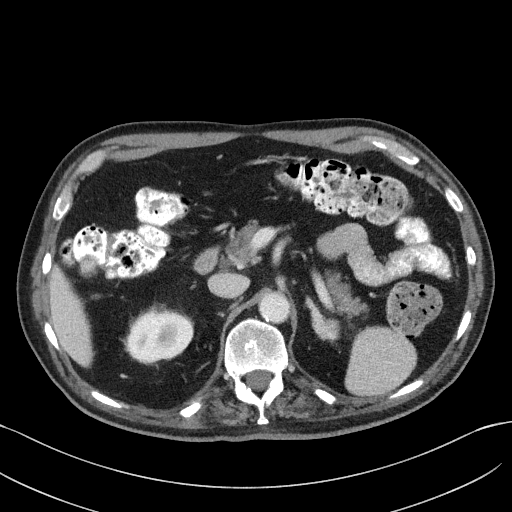
[im 102/204  lung]
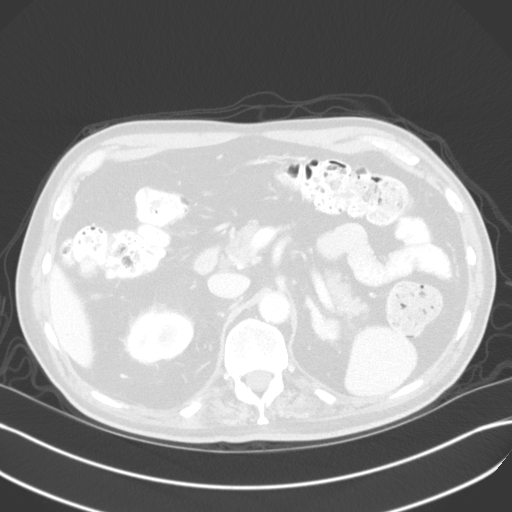
[im 127/204  soft-tissue]
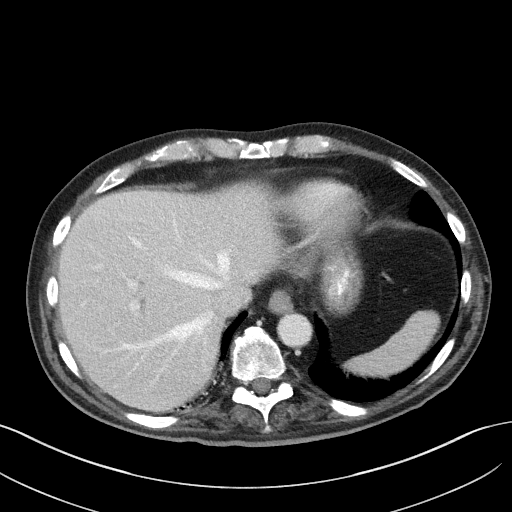
[im 127/204  lung]
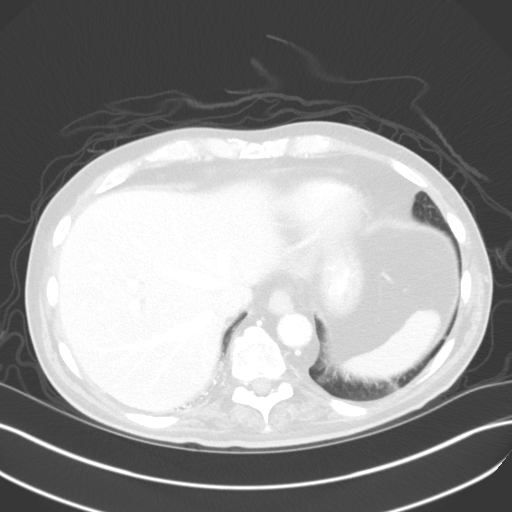
[im 153/204  soft-tissue]
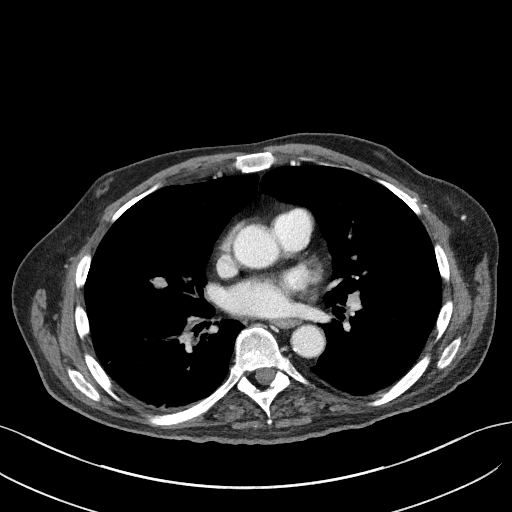
[im 153/204  lung]
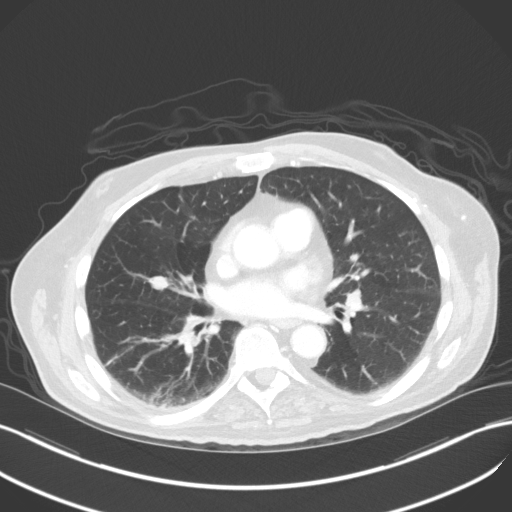
[im 178/204  soft-tissue]
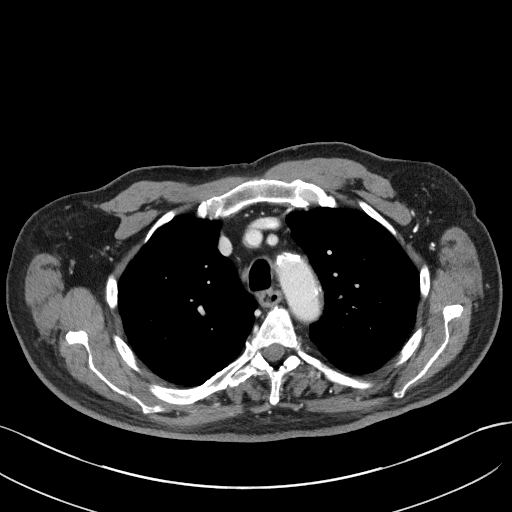
[im 178/204  lung]
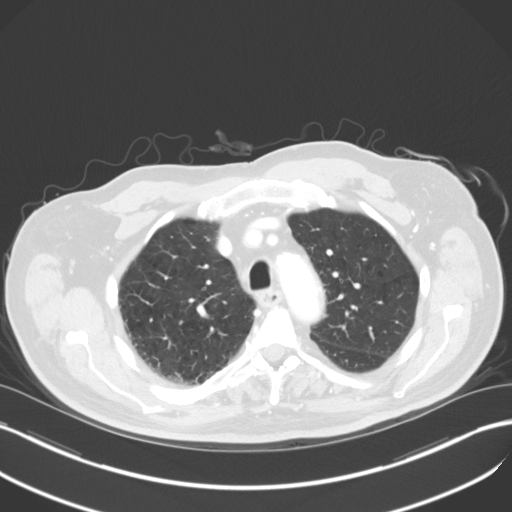

[10 of 46 positions shown; findings below may reference images not displayed]

The most recent is
05/28/2017 which was noncontrast. The most recent contrast study is
02/05/2017. Prior PET-CT 12/25/2015
FINDINGS: CT CHEST FINDINGS

Cardiovascular: The heart is normal in size. No pericardial
effusion. The aorta is normal in caliber. No dissection. The branch
vessels are patent. Mild-to-moderate scattered atherosclerotic
calcifications. Scattered coronary artery calcifications.

Mediastinum/Nodes: No mediastinal or hilar mass or lymphadenopathy.
Small scattered lymph nodes are noted. The esophagus is grossly
normal.

Lungs/Pleura: Numerous pulmonary nodules consistent with pulmonary
metastatic disease.

9.5 mm right middle lobe nodule on image number 77.

10 mm superior segment right lower lobe pulmonary nodule on image
number 67.

9 mm left lower lobe pulmonary nodule on image number 92.

Numerous other pulmonary nodules.

Underlying emphysematous changes with areas of pulmonary scarring.
No acute pulmonary findings.

Musculoskeletal: No definite findings for osseous metastatic
disease. Severe degenerative changes noted in the cervical spine.

CT ABDOMEN AND PELVIS FINDINGS

Hepatobiliary: No focal hepatic lesions to suggest metastatic
disease. The gallbladder surgically absent. No common bile duct
dilatation.

Pancreas: No mass, inflammation or ductal dilatation. Duodenal
diverticulum noted near the pancreatic head.

Spleen: Stable 3.5 cm splenic lesion.

Adrenals/Urinary Tract: Small right adrenal gland nodule is stable.
Stable enhancing mass associated with the lateral limb of the left
adrenal gland measuring approximately 2.4 x 1.7 cm. This most
recently measured 1.8 x 1.3 cm.

Prior left nephrectomy. There is a persistent rim calcified fatty
lesion in the nephrectomy bed which may be fat necrosis or fat
infarction. There is a new enhancing nodule along the lateral margin
of this area on image number 57 of series 6 which measures 6 mm and
is likely a small metastasis.

The right kidney is unremarkable. No renal lesion or hydronephrosis.
The bladder is unremarkable.

Stomach/Bowel: The stomach, duodenum, small bowel and colon appear
stable. There is a Hartmann's pouch and a left lower quadrant
colostomy.

Vascular/Lymphatic: Stable atherosclerotic calcifications involving
the abdominal aorta and iliac arteries. No aneurysm or dissection.
The branch vessels are patent. The major venous structures are
patent.

No retroperitoneal lymphadenopathy.

Numerous hyperenhancing lesions are again demonstrated in the
abdomen.

23.5 mm lesion along the anterior and inferior border of the liver
on series 6, image 44 previously measured 21.5 mm.

26 x 25 mm hyperenhancing lesion in the right pericolic gutter on
image number 70 of series 6 previously measured 20 x 13.5 mm.

Large irregular enhancing mass in the right paracolic gutter on
image number 85 measures 7.6 x 6.3 cm and previously measured 6.7 x
5.4 cm. It appears to invade the transversalis muscle and there are
large recruited feeding vessels.

Reproductive: The prostate gland and seminal vesicles are
unremarkable.

Other: No pelvic mass or adenopathy. There are stable large fatty
lymph nodes in the pelvis. No inguinal adenopathy. No free abdominal
or pelvic fluid collections.

Musculoskeletal: No obvious osseous metastatic disease.
IMPRESSION: 1. Numerous metastatic pulmonary nodules new since prior PET-CT of
0357. No prior chest CT for comparison.
2. Enlarging metastatic implants in the abdomen as detailed above.
There is also a new small enhancing lesion along the lateral margin
of the left nephrectomy bed.
3. The largest lesion in the right lower quadrant is invading the
transversalis muscle.
4. Stable splenic lesion.

## 2018-12-19 ENCOUNTER — Other Ambulatory Visit: Payer: Self-pay | Admitting: Family Medicine

## 2018-12-21 NOTE — Telephone Encounter (Signed)
Please send controlled substance for patient. Thank you!

## 2018-12-24 DIAGNOSIS — R32 Unspecified urinary incontinence: Secondary | ICD-10-CM | POA: Diagnosis not present

## 2019-01-03 ENCOUNTER — Other Ambulatory Visit: Payer: Self-pay | Admitting: Internal Medicine

## 2019-01-13 ENCOUNTER — Ambulatory Visit (HOSPITAL_COMMUNITY): Payer: PPO

## 2019-01-13 ENCOUNTER — Other Ambulatory Visit: Payer: PPO

## 2019-01-14 ENCOUNTER — Ambulatory Visit: Payer: PPO | Admitting: Family Medicine

## 2019-01-20 ENCOUNTER — Ambulatory Visit: Payer: PPO | Admitting: Oncology

## 2019-01-20 DIAGNOSIS — R32 Unspecified urinary incontinence: Secondary | ICD-10-CM | POA: Diagnosis not present

## 2019-01-20 DIAGNOSIS — Z933 Colostomy status: Secondary | ICD-10-CM | POA: Diagnosis not present

## 2019-02-02 ENCOUNTER — Other Ambulatory Visit: Payer: Self-pay | Admitting: Internal Medicine

## 2019-02-04 ENCOUNTER — Telehealth: Payer: Self-pay | Admitting: Internal Medicine

## 2019-02-04 MED ORDER — PANTOPRAZOLE SODIUM 40 MG PO TBEC: 40.0000 mg | DELAYED_RELEASE_TABLET | Freq: Every day | ORAL | 3 refills | Status: DC

## 2019-02-04 NOTE — Telephone Encounter (Signed)
Patient wife called would like to know if the patient needs to continue talking pantoprazole (PROTONIX) 40 MG

## 2019-02-04 NOTE — Telephone Encounter (Signed)
Given his history of IDA, gastric ulcers if this medication is not causing him any issues, I would be inclined to continue it It can be refilled

## 2019-02-04 NOTE — Telephone Encounter (Signed)
Spoke to patients wife Peter Congo, refills have been sent to [facility].

## 2019-02-09 ENCOUNTER — Other Ambulatory Visit: Payer: Self-pay | Admitting: Family Medicine

## 2019-02-18 DIAGNOSIS — R32 Unspecified urinary incontinence: Secondary | ICD-10-CM | POA: Diagnosis not present

## 2019-03-10 ENCOUNTER — Ambulatory Visit (HOSPITAL_COMMUNITY)
Admission: RE | Admit: 2019-03-10 | Discharge: 2019-03-10 | Disposition: A | Discharge status: Home / Self Care | Payer: PPO | Source: Ambulatory Visit | Attending: Oncology | Admitting: Oncology

## 2019-03-10 ENCOUNTER — Other Ambulatory Visit: Payer: Self-pay

## 2019-03-10 ENCOUNTER — Inpatient Hospital Stay: Payer: PPO | Attending: Oncology

## 2019-03-10 ENCOUNTER — Encounter (HOSPITAL_COMMUNITY): Payer: Self-pay | Admitting: Radiology

## 2019-03-10 DIAGNOSIS — C786 Secondary malignant neoplasm of retroperitoneum and peritoneum: Secondary | ICD-10-CM | POA: Insufficient documentation

## 2019-03-10 DIAGNOSIS — D509 Iron deficiency anemia, unspecified: Secondary | ICD-10-CM | POA: Insufficient documentation

## 2019-03-10 DIAGNOSIS — Z905 Acquired absence of kidney: Secondary | ICD-10-CM | POA: Diagnosis not present

## 2019-03-10 DIAGNOSIS — C649 Malignant neoplasm of unspecified kidney, except renal pelvis: Secondary | ICD-10-CM | POA: Insufficient documentation

## 2019-03-10 DIAGNOSIS — C7800 Secondary malignant neoplasm of unspecified lung: Secondary | ICD-10-CM | POA: Diagnosis not present

## 2019-03-10 DIAGNOSIS — Z87440 Personal history of urinary (tract) infections: Secondary | ICD-10-CM | POA: Diagnosis not present

## 2019-03-10 DIAGNOSIS — D739 Disease of spleen, unspecified: Secondary | ICD-10-CM | POA: Diagnosis not present

## 2019-03-10 DIAGNOSIS — Z79899 Other long term (current) drug therapy: Secondary | ICD-10-CM | POA: Diagnosis not present

## 2019-03-10 DIAGNOSIS — C7972 Secondary malignant neoplasm of left adrenal gland: Secondary | ICD-10-CM | POA: Diagnosis not present

## 2019-03-10 DIAGNOSIS — E279 Disorder of adrenal gland, unspecified: Secondary | ICD-10-CM | POA: Insufficient documentation

## 2019-03-10 DIAGNOSIS — Z85528 Personal history of other malignant neoplasm of kidney: Secondary | ICD-10-CM | POA: Diagnosis not present

## 2019-03-10 LAB — CMP (CANCER CENTER ONLY)
ALT: 36 U/L (ref 0–44)
AST: 19 U/L (ref 15–41)
Albumin: 3.3 g/dL — ABNORMAL LOW (ref 3.5–5.0)
Alkaline Phosphatase: 72 U/L (ref 38–126)
Anion gap: 8 (ref 5–15)
BUN: 31 mg/dL — ABNORMAL HIGH (ref 8–23)
CO2: 23 mmol/L (ref 22–32)
Calcium: 9.2 mg/dL (ref 8.9–10.3)
Chloride: 107 mmol/L (ref 98–111)
Creatinine: 1.34 mg/dL — ABNORMAL HIGH (ref 0.61–1.24)
GFR, Est AFR Am: >60 mL/min (ref >60)
GFR, Estimated: 56 mL/min — ABNORMAL LOW (ref >60)
Glucose, Bld: 99 mg/dL (ref 70–99)
Potassium: 4.8 mmol/L (ref 3.5–5.1)
Sodium: 138 mmol/L (ref 135–145)
Total Bilirubin: 0.4 mg/dL (ref 0.3–1.2)
Total Protein: 7.7 g/dL (ref 6.5–8.1)

## 2019-03-10 LAB — CBC WITH DIFFERENTIAL (CANCER CENTER ONLY)
Abs Immature Granulocytes: 0.08 10*3/uL — ABNORMAL HIGH (ref 0.00–0.07)
Basophils Absolute: 0.1 10*3/uL (ref 0.0–0.1)
Basophils Relative: 1 %
Eosinophils Absolute: 0.2 10*3/uL (ref 0.0–0.5)
Eosinophils Relative: 2 %
HCT: 46.9 % (ref 39.0–52.0)
Hemoglobin: 15.2 g/dL (ref 13.0–17.0)
Immature Granulocytes: 1 %
Lymphocytes Relative: 9 %
Lymphs Abs: 1.1 10*3/uL (ref 0.7–4.0)
MCH: 29.7 pg (ref 26.0–34.0)
MCHC: 32.4 g/dL (ref 30.0–36.0)
MCV: 91.6 fL (ref 80.0–100.0)
Monocytes Absolute: 1.1 10*3/uL — ABNORMAL HIGH (ref 0.1–1.0)
Monocytes Relative: 10 %
Neutro Abs: 8.6 10*3/uL — ABNORMAL HIGH (ref 1.7–7.7)
Neutrophils Relative %: 77 %
Platelet Count: 179 10*3/uL (ref 150–400)
RBC: 5.12 MIL/uL (ref 4.22–5.81)
RDW: 15.9 % — ABNORMAL HIGH (ref 11.5–15.5)
WBC Count: 11.1 10*3/uL — ABNORMAL HIGH (ref 4.0–10.5)
nRBC: 0 % (ref 0.0–0.2)

## 2019-03-10 MED ORDER — IOHEXOL 300 MG/ML  SOLN
100.0000 mL | Freq: Once | INTRAMUSCULAR | Status: AC | PRN
  Administered 2019-03-10: 100 mL via INTRAVENOUS

## 2019-03-10 MED ORDER — SODIUM CHLORIDE (PF) 0.9 % IJ SOLN
INTRAMUSCULAR | Status: AC
  Filled 2019-03-10: qty 50

## 2019-03-15 ENCOUNTER — Other Ambulatory Visit: Payer: Self-pay | Admitting: Oncology

## 2019-03-15 ENCOUNTER — Telehealth: Payer: Self-pay | Admitting: Oncology

## 2019-03-15 ENCOUNTER — Inpatient Hospital Stay: Payer: PPO | Attending: Oncology | Admitting: Oncology

## 2019-03-15 ENCOUNTER — Other Ambulatory Visit: Payer: Self-pay

## 2019-03-15 VITALS — BP 192/84 | HR 70 | Temp 98.3°F | Resp 17 | Ht 72.0 in

## 2019-03-15 DIAGNOSIS — C649 Malignant neoplasm of unspecified kidney, except renal pelvis: Secondary | ICD-10-CM

## 2019-03-15 DIAGNOSIS — C786 Secondary malignant neoplasm of retroperitoneum and peritoneum: Secondary | ICD-10-CM | POA: Diagnosis not present

## 2019-03-15 DIAGNOSIS — Z79899 Other long term (current) drug therapy: Secondary | ICD-10-CM | POA: Diagnosis not present

## 2019-03-15 NOTE — Telephone Encounter (Signed)
Called and left msg. Mailed printout  °

## 2019-03-15 NOTE — Progress Notes (Signed)
Hematology and Oncology Follow Up Visit  Austin [name] [id number] 06-Nov-1955 63 y.o. 03/15/2019 8:42 AM [name], Austin [name], MDBurchette, Austin Sierras, MD   Principle Diagnosis: 63 year old man with renal cell carcinoma diagnosed in 2013.  He developed stage IV disease with peritoneal and lung involvement.   Prior Therapy:   1. He is status post robotic-assisted laparoscopic nephrectomy on the left done on 04/23/2012. The pathology revealed renal cell carcinoma with a grade 3/4 with the pathological staging of T3a.   2. Votrient 800 mg daily started around 12/27/2013. This was reduced to 400 mg subsequently and have been discontinued since 02/03/2014 due to poor tolerance.  Current therapy: He declined any additional therapy currently on active surveillance.  Interim History: [name] returns today for a repeat evaluation.  He reports no major changes in his health.  He has reported increased body pain predominantly upper body and chest.  He has also reported a increased knot on his left biceps.  He denies any fevers or chills.  He denies any recent hospitalizations or illnesses.  He denies any recent infections.   He denied headaches, blurry vision, syncope or seizures.  Denies any fevers, chills or sweats.  Denied chest pain, palpitation, orthopnea or leg edema.  Denied cough, wheezing or hemoptysis.  Denied nausea, vomiting or abdominal pain.  Denies any constipation or diarrhea.  Denies any frequency urgency or hesitancy.  Denies any arthralgias or myalgias.  Denies any skin rashes or lesions.  Denies any bleeding or clotting tendency.  Denies any easy bruising.  Denies any hair or nail changes.  Denies any anxiety or depression.  Remaining review of system is negative.      Medications: Updated without changes. Current Outpatient Medications  Medication Sig Dispense Refill  . acetaminophen (TYLENOL) 500 MG tablet Take 1 tablet (500 mg total) by mouth every 6 (six) hours as needed for  mild pain, moderate pain, fever or headache. 30 tablet 0  . albuterol (PROVENTIL HFA;VENTOLIN HFA) 108 (90 BASE) MCG/ACT inhaler Inhale 2 puffs into the lungs every 6 (six) hours as needed for wheezing or shortness of breath. 1 Inhaler 0  . BD DISP NEEDLES 18G X 1-1/2" MISC INJECT TESTOSTERONE INTO THE MUSCLE AS DIRECTED EVERY 14 DAYS 6 each 0  . cetirizine (ZYRTEC) 10 MG tablet Take 10 mg by mouth daily with breakfast.    . escitalopram (LEXAPRO) 10 MG tablet TAKE 1 TABLET BY MOUTH IN THE MORNING 90 tablet 0  . furosemide (LASIX) 20 MG tablet Take 1 tablet (20 mg total) by mouth daily. (Patient taking differently: Take 20 mg by mouth daily as needed for fluid. ) 30 tablet 0  . ketoconazole (NIZORAL) 2 % cream APPLY AS NEEDED TO RASH (Patient taking differently: Apply to groin or legs daily as needed for rash) 60 g 1  . levofloxacin (LEVAQUIN) 500 MG tablet Take 1 tablet (500 mg total) by mouth daily. 7 tablet 0  . levothyroxine (SYNTHROID, LEVOTHROID) 25 MCG tablet TAKE 1 TABLET BY MOUTH DAILY BEFORE BREAKFAST 90 tablet 3  . lisinopril (PRINIVIL,ZESTRIL) 10 MG tablet Take 1 tablet (10 mg total) by mouth daily. 90 tablet 1  . Magnesium 250 MG TABS Take 1 tablet by mouth daily.    . Melatonin 3 MG TABS Take 3 mg by mouth at bedtime as needed (for sleep).    . Multiple Vitamin (MULTIVITAMIN WITH MINERALS) TABS tablet Take 1 tablet by mouth daily with breakfast.     . pantoprazole (PROTONIX) 40  MG tablet Take 1 tablet (40 mg total) by mouth daily. 30 tablet 3  . SAFETY-LOK 3CC SYR 22GX1.5" 22G X 1-1/2" 3 ML MISC INJECT 1 ML OF TESTOSTERONE EVERY 14 DAYS 6 each 0  . sulfamethoxazole-trimethoprim (BACTRIM DS,SEPTRA DS) 800-160 MG tablet Take 1 tablet by mouth 2 (two) times daily. 14 tablet 0  . tamsulosin (FLOMAX) 0.4 MG CAPS capsule Take 1 capsule (0.4 mg total) by mouth daily. 30 capsule 3  . testosterone enanthate (DELATESTRYL) 200 MG/ML injection INJECT 1 ML INTRAMUSCULARLY  EVERY TWO WEEKS 10 mL 1   . traMADol (ULTRAM) 50 MG tablet TAKE 1 TABLET BY MOUTH EVERY 6 HOURS AS NEEDED. 60 tablet 0  . traZODone (DESYREL) 100 MG tablet TAKE 1 TABLET BY MOUTH AT BEDTIME 90 tablet 0  . triamcinolone cream (KENALOG) 0.1 % Apply 1 application topically 2 (two) times daily as needed (for rash). Applies to face     No current facility-administered medications for this visit.      Allergies:  Allergies  Allergen Reactions  . Penicillins Rash and Other (See Comments)    Has patient had a PCN reaction causing immediate rash, facial/tongue/throat swelling, SOB or lightheadedness with hypotension: YES Has patient had a PCN reaction causing severe rash involving mucus membranes or skin necrosis: No Has patient had a PCN reaction that required hospitalization No Has patient had a PCN reaction occurring within the last 10 years: Yes  If all of the above answers are "NO", then may proceed with Cephalosporin use.    Past Medical History, Surgical history, Social history, and Family History reviewed today without changes.    Physical Exam:   ECOG: 2    General appearance: Comfortable appearing without any discomfort Head: Normocephalic without any trauma Oropharynx: Mucous membranes are moist and pink without any thrush or ulcers. Eyes: Pupils are equal and round reactive to light. Lymph nodes: No cervical, supraclavicular, inguinal or axillary lymphadenopathy.   Heart:regular rate and rhythm.  S1 and S2 without leg edema. Lung: Clear without any rhonchi or wheezes.  No dullness to percussion. Abdomin: Soft, nontender, nondistended with good bowel sounds.  No hepatosplenomegaly. Musculoskeletal: Mobile nodule noted on his left bicep muscle. Neurological: No deficits noted on motor, sensory and deep tendon reflex exam. Skin: No petechial rash or dryness.  Appeared moist.      Lab Results: Lab Results  Component Value Date   WBC 11.1 (H) 03/10/2019   HGB 15.2 03/10/2019   HCT 46.9  03/10/2019   MCV 91.6 03/10/2019   PLT 179 03/10/2019     Chemistry      Component Value Date/Time   NA 138 03/10/2019 0937   NA 138 03/13/2017 0922   K 4.8 03/10/2019 0937   K 4.6 03/13/2017 0922   CL 107 03/10/2019 0937   CO2 23 03/10/2019 0937   CO2 24 03/13/2017 0922   BUN 31 (H) 03/10/2019 0937   BUN 27.4 (H) 03/13/2017 0922   CREATININE 1.34 (H) 03/10/2019 0937   CREATININE 0.87 06/05/2017 1643   CREATININE 1.3 03/13/2017 0922      Component Value Date/Time   CALCIUM 9.2 03/10/2019 0937   CALCIUM 9.3 03/13/2017 0922   ALKPHOS 72 03/10/2019 0937   ALKPHOS 91 03/13/2017 0922   AST 19 03/10/2019 0937   AST 17 03/13/2017 0922   ALT 36 03/10/2019 0937   ALT 21 03/13/2017 0922   BILITOT 0.4 03/10/2019 0937   BILITOT 0.27 03/13/2017 0922     IMPRESSION: Chest  Impression:  1. Stable bilateral round pulmonary nodular metastasis. 2. No mediastinal nodal metastasis.  Abdomen / Pelvis Impression:  1. Interval mild-to-moderate increase in size of large lobular mass in the RIGHT retroperitoneal space superior to the iliac crest. Increase in size of smaller nodule in the RIGHT pararenal space. 2. New small peritoneal metastasis in the anterior ventral peritoneal space of the RIGHT abdomen. 3. Stable round hypoenhancing lesion in the spleen. 4. No evidence local recurrence in the LEFT nephrectomy bed. 5. Stable nodular masslike enhancement of the LEFT adrenal gland presumed metastatic disease.   Impression and Plan:  63 year old man with:  1.  Renal cell carcinoma diagnosed in 2013 and subsequently developed stage IV with peritoneal and lung involvement.  He has opted against any systemic therapy and has been on active surveillance with slow progression over the years.  CT scan obtained on 03/10/2019 showed continued progression of his disease that is rather small and asymptomatic.  Risks and benefits of continuing active surveillance versus systemic therapy was  reiterated again.  Options of therapy including oral targeted agents versus immune therapy were reviewed.  I recommend continued active surveillance.  2.  Recurrent urinary tract infection: Resolved at this time.  3. Prognosis: His disease remains incurable and performance status is marginal and he would be a marginal candidate for aggressive measures.  Any therapy would be palliative.  4.  Nodule in the left bicep muscle: Unclear etiology could be due to metastatic disease and MRI of the arm may be needed.  For the time being it does not appear to be problematic we will continue to monitor.  5. Followup: In 4 months for repeat evaluation.  25  minutes was spent with the patient face-to-face today.  More than 50% of time was spent on reviewing his disease status, reviewing imaging studies, treatment options and answering questions regarding future plan of care.   Zola Button, MD 8/4/20208:42 AM

## 2019-03-18 ENCOUNTER — Other Ambulatory Visit: Payer: Self-pay | Admitting: Oncology

## 2019-03-21 ENCOUNTER — Other Ambulatory Visit: Payer: Self-pay | Admitting: Family Medicine

## 2019-03-22 ENCOUNTER — Other Ambulatory Visit: Payer: Self-pay | Admitting: Oncology

## 2019-03-22 MED ORDER — TRAMADOL HCL 50 MG PO TABS: 50.0000 mg | ORAL_TABLET | Freq: Four times a day (QID) | ORAL | 0 refills | Status: DC | PRN

## 2019-04-01 DIAGNOSIS — R32 Unspecified urinary incontinence: Secondary | ICD-10-CM | POA: Diagnosis not present

## 2019-04-15 ENCOUNTER — Other Ambulatory Visit: Payer: PPO

## 2019-04-15 ENCOUNTER — Encounter: Payer: Self-pay | Admitting: Family Medicine

## 2019-04-15 ENCOUNTER — Other Ambulatory Visit: Payer: Self-pay

## 2019-04-15 ENCOUNTER — Ambulatory Visit (INDEPENDENT_AMBULATORY_CARE_PROVIDER_SITE_OTHER): Payer: PPO | Admitting: Family Medicine

## 2019-04-15 VITALS — BP 128/76 | HR 65 | Temp 97.8°F

## 2019-04-15 DIAGNOSIS — M791 Myalgia, unspecified site: Secondary | ICD-10-CM

## 2019-04-15 DIAGNOSIS — E039 Hypothyroidism, unspecified: Secondary | ICD-10-CM | POA: Diagnosis not present

## 2019-04-15 DIAGNOSIS — R5383 Other fatigue: Secondary | ICD-10-CM

## 2019-04-15 DIAGNOSIS — R2232 Localized swelling, mass and lump, left upper limb: Secondary | ICD-10-CM

## 2019-04-15 DIAGNOSIS — Z23 Encounter for immunization: Secondary | ICD-10-CM | POA: Diagnosis not present

## 2019-04-15 LAB — VITAMIN D 25 HYDROXY (VIT D DEFICIENCY, FRACTURES): VITD: 40.24 ng/mL (ref 30.00–100.00)

## 2019-04-15 LAB — TSH: TSH: 4.19 u[IU]/mL (ref 0.35–4.50)

## 2019-04-15 NOTE — Progress Notes (Signed)
Subjective:     Patient ID: Austin [name], male   DOB: [birth year], 63 y.o.   MRN: [id number]  HPI  Patient is here for medical follow-up.  He has history of metastatic renal cancer and recently saw oncology.  He has evidence for pulmonary nodular metastases but no mediastinal nodal metastases.  He has large lobular mass right retroperitoneal space and new small peritoneal metastases but no evidence for bony involvement.  Also has evidence for probable left adrenal gland metastases.  He has a large irregular lump in the left arm.  This is mostly nonpainful.  He has discussed with oncologist.  There was discussion that could pursue MRI and possible radiation therapy (if malignant) and pt decided against.  He has had some increased generalized weakness and muscle weakness.  Has hypothyroidism and last TSH was at goal.  He frequently feels "cold ".  They would like to have his thyroid level checked again.  He is on testosterone replacement and last testosterone levels were adequate  Still needs flu vaccine  He has neurogenic bladder and is doing in and out caths 4 times daily.  No recent fever.    Past Medical History:  Diagnosis Date  . 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 C. difficile colitis 02/08/2017  . Cerebrovascular accident ([place]) 1994   hx of brainstem stroke, residual diminished lung capacity  . Chronic kidney disease    Left Renal Mass  . 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 Diverticulitis   . Duodenal ulcer   . GIB (gastrointestinal bleeding) 05/28/2017  . Hx of colonic polyps   . Hypertension    has been off BP meds since GI bleed was thought to have been caused by previously prescribed lisinopril   . Hypogonadism   . Intra-abdominal abscess ([place]) 01/23/2017  . Iron deficiency anemia   . 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 Open abdominal  incision with drainage 01/26/2017  . Pituitary adenoma ([place])   . 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 Pneumonia   . PONV (postoperative nausea and vomiting)    vomited after colostomy placement   . Pre-diabetes    last a1c 5.6   . 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[place])   . Urinary incontinence   . Venous stasis    edema   Past Surgical History:  Procedure Laterality Date  . CHOLECYSTECTOMY    . COLON RESECTION N/A 05/04/2017   Procedure: LAPAROSCOPIC SIGMOID COLON RESECTION WITH END COLSOTOMY ERAS PATHWAY;  Surgeon: Alphonsa Overall, MD;  Location: [facility];  Service: General;  Laterality: N/A;  . COLONOSCOPY  02/27/2012   Procedure: COLONOSCOPY;  Surgeon: Jerene Bears, MD;  Location: [facility];  Service: Gastroenterology;  Laterality: N/A;  . COLONOSCOPY N/A 05/31/2017   Procedure: COLONOSCOPY;  Surgeon: Milus Banister, MD;  Location: [facility];  Service: Endoscopy;  Laterality: N/A;  . COLOSTOMY TAKEDOWN N/A 08/20/2017   Procedure: LAPAROSCOPIC ASSISTED REVISION END COLOSTOMY AND ENTEROLYSIS OF ADHESIONS;  Surgeon: Alphonsa Overall, MD;  Location: [facility];  Service: General;  Laterality: N/A;  ERAS PATHWAY  . ESOPHAGOGASTRODUODENOSCOPY N/A 05/31/2017   Procedure: ESOPHAGOGASTRODUODENOSCOPY (EGD);  Surgeon: Milus Banister, MD;  Location: [facility];  Service: Endoscopy;  Laterality: N/A;  . gamma knife radiation surgery  05/2010   for pituitary  . IR  RADIOLOGIST EVAL & MGMT  02/12/2017  . IR RADIOLOGIST EVAL & MGMT  03/17/2017  . IR RADIOLOGIST EVAL & MGMT  04/14/2017  . IR RADIOLOGIST EVAL & MGMT  02/05/2017  . PITUITARY SURGERY  2007   nose approach  . ROBOT ASSISTED LAPAROSCOPIC NEPHRECTOMY  04/23/2012   Procedure: ROBOTIC ASSISTED LAPAROSCOPIC NEPHRECTOMY;  Surgeon: Alexis Frock, MD;  Location: [facility];  Service: Urology;  Laterality: Left;  radical  . stomach peg  02/1993   removed 5-6 years later  . TRACHEOSTOMY TUBE PLACEMENT  02/1993   removed 04/1993  .  UMBILICAL HERNIA REPAIR  04/23/2012   Procedure: HERNIA REPAIR UMBILICAL ADULT;  Surgeon: Alexis Frock, MD;  Location: [facility];  Service: Urology;;  . [name]  . vocal cord surgery     injected with collagen, then fat from stomach to improve speech s/p stroke    reports that he quit smoking about 26 years ago. His smoking use included cigarettes. He has a 11.50 pack-year smoking history. He has never used smokeless tobacco. He reports that he does not drink alcohol or use drugs. family history includes Alcohol abuse in his father; Arthritis in his maternal grandmother and mother; Colon cancer in his brother; Esophageal cancer (age of onset: 64) in his father; Hyperlipidemia in his mother; Hypertension in his brother; Stroke in his paternal grandmother; Throat cancer in his father; Uterine cancer (age of onset: 77) in his mother. Allergies  Allergen Reactions  . Penicillins Rash and Other (See Comments)    Has patient had a PCN reaction causing immediate rash, facial/tongue/throat swelling, SOB or lightheadedness with hypotension: YES Has patient had a PCN reaction causing severe rash involving mucus membranes or skin necrosis: No Has patient had a PCN reaction that required hospitalization No Has patient had a PCN reaction occurring within the last 10 years: Yes  If all of the above answers are "NO", then may proceed with Cephalosporin use.    Review of Systems  Constitutional: Positive for fatigue. Negative for fever.  Respiratory: Negative for cough and shortness of breath.   Cardiovascular: Negative for chest pain.  Neurological: Positive for weakness. Negative for dizziness and syncope.  Psychiatric/Behavioral: Negative for confusion.       Objective:   Physical Exam Cardiovascular:     Pulses: Normal pulses.     Heart sounds: Normal heart sounds.  Pulmonary:     Effort: Pulmonary effort is normal.     Breath sounds: Normal breath sounds.  Musculoskeletal:     Right  lower leg: No edema.     Left lower leg: No edema.     Comments: He has fairly large irregular firm lump in his left upper arm region.  This is approximately 4 x 6 cm.  Slightly mobile  Neurological:     Mental Status: He is alert.        Assessment:     #1 metastatic renal cancer-with evidence of some local progression as above.  No known bony metastases  #2 mass left arm.  Suspect this is probably related to his renal cell carcinoma.  This definitely has irregular features  #3 hypothyroidism  #4 generalized weakness.  Difficult to sort out how much of this is his cancer versus other    Plan:     -We will check TSH and 25 hydroxy vitamin D level -Flu vaccine given -again discussed options regarding left arm mass with possible MRI but they have decided against.  He does not wish  to pursue radiation- even if this is malignant.  [name] MD [facility] at [facility]

## 2019-04-28 DIAGNOSIS — Z933 Colostomy status: Secondary | ICD-10-CM | POA: Diagnosis not present

## 2019-05-02 ENCOUNTER — Other Ambulatory Visit: Payer: Self-pay

## 2019-05-02 ENCOUNTER — Encounter: Payer: Self-pay | Admitting: Family Medicine

## 2019-05-02 ENCOUNTER — Ambulatory Visit (INDEPENDENT_AMBULATORY_CARE_PROVIDER_SITE_OTHER): Payer: PPO | Admitting: Family Medicine

## 2019-05-02 VITALS — BP 146/90 | HR 69 | Temp 98.1°F

## 2019-05-02 DIAGNOSIS — R233 Spontaneous ecchymoses: Secondary | ICD-10-CM

## 2019-05-02 DIAGNOSIS — S20211A Contusion of right front wall of thorax, initial encounter: Secondary | ICD-10-CM | POA: Diagnosis not present

## 2019-05-02 DIAGNOSIS — C649 Malignant neoplasm of unspecified kidney, except renal pelvis: Secondary | ICD-10-CM | POA: Diagnosis not present

## 2019-05-02 LAB — CBC WITH DIFFERENTIAL/PLATELET
Basophils Absolute: 0.1 10*3/uL (ref 0.0–0.1)
Basophils Relative: 0.7 % (ref 0.0–3.0)
Eosinophils Absolute: 0.2 10*3/uL (ref 0.0–0.7)
Eosinophils Relative: 2.2 % (ref 0.0–5.0)
HCT: 35.9 % — ABNORMAL LOW (ref 39.0–52.0)
Hemoglobin: 11.9 g/dL — ABNORMAL LOW (ref 13.0–17.0)
Lymphocytes Relative: 9.7 % — ABNORMAL LOW (ref 12.0–46.0)
Lymphs Abs: 1 10*3/uL (ref 0.7–4.0)
MCHC: 33.2 g/dL (ref 30.0–36.0)
MCV: 90.1 fl (ref 78.0–100.0)
Monocytes Absolute: 1 10*3/uL (ref 0.1–1.0)
Monocytes Relative: 9.1 % (ref 3.0–12.0)
Neutro Abs: 8.3 10*3/uL — ABNORMAL HIGH (ref 1.4–7.7)
Neutrophils Relative %: 78.3 % — ABNORMAL HIGH (ref 43.0–77.0)
Platelets: 180 10*3/uL (ref 150.0–400.0)
RBC: 3.99 Mil/uL — ABNORMAL LOW (ref 4.22–5.81)
RDW: 15.3 % (ref 11.5–15.5)
WBC: 10.6 10*3/uL — ABNORMAL HIGH (ref 4.0–10.5)

## 2019-05-02 NOTE — Patient Instructions (Signed)
Hematoma A hematoma is a collection of blood under the skin, in an organ, in a body space, in a joint space, or in other tissue. The blood can thicken (clot) to form a lump that you can see and feel. The lump is often firm and may become sore and tender. Most hematomas get better in a few days to weeks. However, some hematomas may be serious and require medical care. Hematomas can range from very small to very large. What are the causes? This condition is caused by:  A blunt or penetrating injury.  A leakage from a blood vessel under the skin.  Some medical procedures, including surgeries, such as oral surgery, face lifts, and surgeries on the joints.  Some medical conditions that cause bleeding or bruising. There may be multiple hematomas that appear in different areas of the body. What increases the risk? You are more likely to develop this condition if:  You are an older adult.  You use blood thinners. What are the signs or symptoms?  Symptoms of this condition depend on where the hematoma is located.  Common symptoms of a hematoma that is under the skin include:  A firm lump on the body.  Pain and tenderness in the area.  Bruising. Blue, dark blue, purple-red, or yellowish skin (discoloration) may appear at the site of the hematoma if the hematoma is close to the surface of the skin. Common symptoms of a hematoma that is deep in the tissues or body spaces may be less obvious. They include:  A collection of blood in the stomach (intra-abdominal hematoma). This may cause pain in the abdomen, weakness, fainting, and shortness of breath.  A collection of blood in the head (intracranial hematoma). This may cause a headache or symptoms such as weakness, trouble speaking or understanding, or a change in consciousness. How is this diagnosed? This condition is diagnosed based on:  Your medical history.  A physical exam.  Imaging tests, such as an ultrasound or CT scan. These may  be needed if your health care provider suspects a hematoma in deeper tissues or body spaces.  Blood tests. These may be needed if your health care provider believes that the hematoma is caused by a medical condition. How is this treated? Treatment for this condition depends on the cause, size, and location of the hematoma. Treatment may include:  Doing nothing. The majority of hematomas do not need treatment as many of them go away on their own over time.  Surgery or close monitoring. This may be needed for large hematomas or hematomas that affect vital organs.  Medicines. Medicines may be given if there is an underlying medical cause for the hematoma. Follow these instructions at home: Managing pain, stiffness, and swelling   If directed, put ice on the affected area. ? Put ice in a plastic bag. ? Place a towel between your skin and the bag. ? Leave the ice on for 20 minutes, 2-3 times a day for the first couple of days.  If directed, apply heat to the affected area after applying ice for a couple of days. Use the heat source that your health care provider recommends, such as a moist heat pack or a heating pad. ? Place a towel between your skin and the heat source. ? Leave the heat on for 20-30 minutes. ? Remove the heat if your skin turns bright red. This is especially important if you are unable to feel pain, heat, or cold. You may have a greater  risk of getting burned.  Raise (elevate) the affected area above the level of your heart while you are sitting or lying down.  If told, wrap the affected area with an elastic bandage. The bandage applies pressure (compression) to the area, which may help to reduce swelling and promote healing. Do not wrap the bandage too tightly around the affected area.  If your hematoma is on a leg or foot (lower extremity) and is painful, your health care provider may recommend crutches. Use them as told by your health care provider. General instructions   Take over-the-counter and prescription medicines only as told by your health care provider.  Keep all follow-up visits as told by your health care provider. This is important. Contact a health care provider if:  You have a fever.  The swelling or discoloration gets worse.  You develop more hematomas. Get help right away if:  Your pain is worse or your pain is not controlled with medicine.  Your skin over the hematoma breaks or starts bleeding.  Your hematoma is in your chest or abdomen and you have weakness, shortness of breath, or a change in consciousness.  You have a hematoma on your scalp that is caused by a fall or injury, and you also have: ? A headache that gets worse. ? Trouble speaking or understanding speech. ? Weakness. ? Change in alertness or consciousness. Summary  A hematoma is a collection of blood under the skin, in an organ, in a body space, in a joint space, or in other tissue.  This condition usually does not need treatment because many hematomas go away on their own over time.  Large hematomas, or those that may affect vital organs, may need surgical drainage or monitoring. If the hematoma is caused by a medical condition, medicines may be prescribed.  Get help right away if your hematoma breaks or starts to bleed, you have shortness of breath, or you have a headache or trouble speaking after a fall. This information is not intended to replace advice given to you by your health care provider. Make sure you discuss any questions you have with your health care provider. Document Released: 03/11/2004 Document Revised: 12/31/2017 Document Reviewed: 12/31/2017 Elsevier Patient Education  2020 [employer].

## 2019-05-02 NOTE — Progress Notes (Signed)
Subjective:     Patient ID: Austin [name], male   DOB: 1956-04-16, 63 y.o.   MRN: [id number]  HPI Patient has multiple medical problems including metastatic renal cell carcinoma.  He presents with extensive bruising right upper arm and chest wall all the way down to the abdomen along with a "lump upper chest wall region.  They deny any trauma history.  He has some soreness at the site of the lump.  No increased dyspnea.  No fevers or chills.  Denies any recent falls.  He has some soreness when extending the right arm in the area of the lump.  Wife thinks the lump is increased slightly in size over the past few days.  They first noted the bruising several days ago.  He has had some declining appetite and declining intake for several months now.  He is followed by oncology.  Recent CT chest results reviewed.  Past Medical History:  Diagnosis Date  . 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 C. difficile colitis 02/08/2017  . Cerebrovascular accident ([place]) 1994   hx of brainstem stroke, residual diminished lung capacity  . Chronic kidney disease    Left Renal Mass  . 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 Diverticulitis   . Duodenal ulcer   . GIB (gastrointestinal bleeding) 05/28/2017  . Hx of colonic polyps   . Hypertension    has been off BP meds since GI bleed was thought to have been caused by previously prescribed lisinopril   . Hypogonadism   . Intra-abdominal abscess ([place]) 01/23/2017  . Iron deficiency anemia   . 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 Open abdominal incision with drainage 01/26/2017  . Pituitary adenoma ([place])   . 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 Pneumonia   . PONV (postoperative nausea and vomiting)    vomited after colostomy placement   . Pre-diabetes    last a1c 5.6   . 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[place])    . Urinary incontinence   . Venous stasis    edema   Past Surgical History:  Procedure Laterality Date  . CHOLECYSTECTOMY    . COLON RESECTION N/A 05/04/2017   Procedure: LAPAROSCOPIC SIGMOID COLON RESECTION WITH END COLSOTOMY ERAS PATHWAY;  Surgeon: Alphonsa Overall, MD;  [facility]: [facility];  Service: General;  Laterality: N/A;  . COLONOSCOPY  02/27/2012   Procedure: COLONOSCOPY;  Surgeon: Jerene Bears, MD;  [facility];  Service: Gastroenterology;  Laterality: N/A;  . COLONOSCOPY N/A 05/31/2017   Procedure: COLONOSCOPY;  Surgeon: Milus Banister, MD;  [facility];  Service: Endoscopy;  Laterality: N/A;  . COLOSTOMY TAKEDOWN N/A 08/20/2017   Procedure: LAPAROSCOPIC ASSISTED REVISION END COLOSTOMY AND ENTEROLYSIS OF ADHESIONS;  Surgeon: Alphonsa Overall, MD;  [facility];  Service: General;  Laterality: N/A;  ERAS PATHWAY  . ESOPHAGOGASTRODUODENOSCOPY N/A 05/31/2017   Procedure: ESOPHAGOGASTRODUODENOSCOPY (EGD);  Surgeon: Milus Banister, MD;  Location: [facility];  Service: Endoscopy;  Laterality: N/A;  . gamma knife radiation surgery  05/2010   for pituitary  . IR RADIOLOGIST EVAL & MGMT  02/12/2017  . IR RADIOLOGIST EVAL & MGMT  03/17/2017  . IR RADIOLOGIST EVAL & MGMT  04/14/2017  . IR RADIOLOGIST EVAL & MGMT  02/05/2017  . PITUITARY SURGERY  2007   nose approach  .  ROBOT ASSISTED LAPAROSCOPIC NEPHRECTOMY  04/23/2012   Procedure: ROBOTIC ASSISTED LAPAROSCOPIC NEPHRECTOMY;  Surgeon: Alexis Frock, MD;  [facility];  Service: Urology;  Laterality: Left;  radical  . stomach peg  02/1993   removed 5-6 years later  . TRACHEOSTOMY TUBE PLACEMENT  02/1993   removed 04/1993  . UMBILICAL HERNIA REPAIR  04/23/2012   Procedure: HERNIA REPAIR UMBILICAL ADULT;  Surgeon: Alexis Frock, MD;  [facility];  Service: Urology;;  . [name]  . vocal cord surgery     injected with collagen, then fat from stomach to improve speech s/p stroke    reports that he quit  smoking about 26 years ago. His smoking use included cigarettes. He has a 11.50 pack-year smoking history. He has never used smokeless tobacco. He reports that he does not drink alcohol or use drugs. family history includes Alcohol abuse in his father; Arthritis in his maternal grandmother and mother; Colon cancer in his brother; Esophageal cancer (age of onset: 20) in his father; Hyperlipidemia in his mother; Hypertension in his brother; Stroke in his paternal grandmother; Throat cancer in his father; Uterine cancer (age of onset: 19) in his mother. Allergies  Allergen Reactions  . Penicillins Rash and Other (See Comments)    Has patient had a PCN reaction causing immediate rash, facial/tongue/throat swelling, SOB or lightheadedness with hypotension: YES Has patient had a PCN reaction causing severe rash involving mucus membranes or skin necrosis: No Has patient had a PCN reaction that required hospitalization No Has patient had a PCN reaction occurring within the last 10 years: Yes  If all of the above answers are "NO", then may proceed with Cephalosporin use.     Review of Systems  Constitutional: Positive for appetite change. Negative for chills and fever.  Respiratory: Negative for shortness of breath.   Cardiovascular: Negative for chest pain.  Gastrointestinal: Negative for abdominal pain.  Neurological: Negative for dizziness and headaches.  Hematological: Negative for adenopathy.       Objective:   Physical Exam Constitutional:      Appearance: Normal appearance.  Cardiovascular:     Rate and Rhythm: Normal rate and regular rhythm.  Pulmonary:     Effort: Pulmonary effort is normal.     Breath sounds: Normal breath sounds.  Musculoskeletal:     Comments: He has extensive bruising involving right arm especially proximally about midway down and involving the right chest wall with ecchymosis extending all the way down to the abdomen.  He has fairly large approximately 8 x 8 cm  area of swelling right pectoral region.  Minimally tender to palpation.  No erythema.  No fluctuance.  No axillary adenopathy  Neurological:     Mental Status: He is alert.        Assessment:     He appears to have probably large right anterior chest wall hematoma with extensive dependent ecchymoses.  No clear history of trauma.  No history of thrombocytopenia    Plan:     -Check CBC  -Consider low-grade heat for example with heating pad several times daily over hematoma  -We explained the imaging could help sort out but would probably not change our management any  -Follow-up for any enlargement of chest wall mass or any other concerns.  They are aware this will take several weeks for ecchymosis to resolve and possibly several months for hematoma to resolve  [name] MD [facility] at [facility]

## 2019-05-09 ENCOUNTER — Other Ambulatory Visit: Payer: Self-pay | Admitting: Family Medicine

## 2019-05-16 DIAGNOSIS — R32 Unspecified urinary incontinence: Secondary | ICD-10-CM | POA: Diagnosis not present

## 2019-05-31 ENCOUNTER — Other Ambulatory Visit: Payer: Self-pay | Admitting: Internal Medicine

## 2019-06-15 DIAGNOSIS — R32 Unspecified urinary incontinence: Secondary | ICD-10-CM | POA: Diagnosis not present

## 2019-06-21 ENCOUNTER — Other Ambulatory Visit: Payer: Self-pay | Admitting: Family Medicine

## 2019-06-24 NOTE — Telephone Encounter (Signed)
Pt's wife calling to check status of refill requests. Pt has one pill left. Please fill ASAP.

## 2019-06-28 ENCOUNTER — Telehealth (INDEPENDENT_AMBULATORY_CARE_PROVIDER_SITE_OTHER): Payer: PPO | Admitting: Family Medicine

## 2019-06-28 ENCOUNTER — Ambulatory Visit: Payer: Self-pay

## 2019-06-28 ENCOUNTER — Other Ambulatory Visit: Payer: Self-pay

## 2019-06-28 DIAGNOSIS — R531 Weakness: Secondary | ICD-10-CM

## 2019-06-28 DIAGNOSIS — I10 Essential (primary) hypertension: Secondary | ICD-10-CM | POA: Diagnosis not present

## 2019-06-28 MED ORDER — LISINOPRIL-HYDROCHLOROTHIAZIDE 20-12.5 MG PO TABS: 1.0000 | ORAL_TABLET | Freq: Every day | ORAL | 11 refills | Status: DC

## 2019-06-28 NOTE — Telephone Encounter (Signed)
Patient's wife called stating that her husband BP has been elevated.  Today it is 201/93 HR 63-64.  Others 208/93, 203/93 and 205/90. She states that her husband is paraplegic.  He has had some swelling at at night to his ankles. He has a slight off and on headache. Per protocol patient was asked to go to ER for evaluation. Per request call was transfered to Dr [name] office for appointment ok'd by [facility].  Reason for Disposition . [2] Systolic BP  >= 774 OR Diastolic >= 128 AND [7] cardiac or neurologic symptoms (e.g., chest pain, difficulty breathing, unsteady gait, blurred vision)  Answer Assessment - Initial Assessment Questions 1. BLOOD PRESSURE: "What is the blood pressure?" "Did you take at least two measurements 5 minutes apart?"     201/93 HR 63-64 2. ONSET: "When did you take your blood pressure?"     1.5 weeks ago 3. HOW: "How did you obtain the blood pressure?" (e.g., visiting nurse, automatic home BP monitor)    Auto home 4. HISTORY: "Do you have a history of high blood pressure?"     yes 5. MEDICATIONS: "Are you taking any medications for blood pressure?" "Have you missed any doses recently?"     no 6. OTHER SYMPTOMS: "Do you have any symptoms?" (e.g., headache, chest pain, blurred vision, difficulty breathing, weakness)     Night swelling to lower extremities 7. PREGNANCY: "Is there any chance you are pregnant?" "When was your last menstrual period?"    N/A  Protocols used: HIGH BLOOD PRESSURE-A-AH

## 2019-06-28 NOTE — Progress Notes (Signed)
This visit type was conducted due to national recommendations for restrictions regarding the COVID-19 pandemic in an effort to limit this patient's exposure and mitigate transmission in our community.   Virtual Visit via Video Note  I connected with Austin [name] on 06/28/19 at  1:15 PM EST by a video enabled telemedicine application and verified that I am speaking with the correct person using two identifiers.  Location patient: home Location provider:work or home office Persons participating in the virtual visit: patient, provider, and patient's wife, Austin [name]  I discussed the limitations of evaluation and management by telemedicine and the availability of in person appointments. The patient expressed understanding and agreed to proceed.   HPI: Austin [name] has multiple chronic problems including type 2 diabetes, metastatic renal cell carcinoma, quadriplegia from prior brainstem stroke, history of pituitary tumor, history of diverticulitis, neurogenic bladder, low testosterone, hypothyroidism, hyperlipidemia.  We received call that sometime around last week started having elevated blood pressure readings.  These have been up over 353 systolic several times with diastolics in the low 29J.  Blood pressure earlier today 204/92.  He is currently on lisinopril 10 mg once daily.  Had previously taken lisinopril HCTZ but blood pressures had improved and we switched to plain lisinopril.  He has not having any current pain issues.  He has some generalized weakness and some nonspecific intermittent bilateral headaches.  No nausea or vomiting.  No visual changes.  No dyspnea.  He is still followed by oncology but he has decided against any further chemotherapy regarding his metastatic renal cell carcinoma.  He has some decreased appetite.  No fever.  No cough.   ROS: See pertinent positives and negatives per HPI.  Past Medical History:  Diagnosis Date  . 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 C. difficile colitis 02/08/2017  . Cerebrovascular accident ([place]) 1994   hx of brainstem stroke, residual diminished lung capacity  . Chronic kidney disease    Left Renal Mass  . 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 Diverticulitis   . Duodenal ulcer   . GIB (gastrointestinal bleeding) 05/28/2017  . Hx of colonic polyps   . Hypertension    has been off BP meds since GI bleed was thought to have been caused by previously prescribed lisinopril   . Hypogonadism   . Intra-abdominal abscess ([place]) 01/23/2017  . Iron deficiency anemia   . 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 Open abdominal incision with drainage 01/26/2017  . Pituitary adenoma ([place])   . 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 Pneumonia   . PONV (postoperative nausea and vomiting)    vomited after colostomy placement   . Pre-diabetes    last a1c 5.6   . 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[place])   . Urinary incontinence   . Venous stasis    edema    Past Surgical History:  Procedure Laterality Date  . CHOLECYSTECTOMY    . COLON RESECTION N/A 05/04/2017   Procedure: LAPAROSCOPIC SIGMOID COLON RESECTION WITH END COLSOTOMY ERAS PATHWAY;  Surgeon: Alphonsa Overall, MD;  [facility];  Service: General;  Laterality: N/A;  . COLONOSCOPY  02/27/2012   Procedure: COLONOSCOPY;  Surgeon: Jerene Bears, MD;  [facility];  Service: Gastroenterology;  Laterality: N/A;  . COLONOSCOPY N/A 05/31/2017   Procedure: COLONOSCOPY;  Surgeon:  Milus Banister, MD;  Location: [facility];  Service: Endoscopy;  Laterality: N/A;  . COLOSTOMY TAKEDOWN N/A 08/20/2017   Procedure: LAPAROSCOPIC ASSISTED REVISION END COLOSTOMY AND ENTEROLYSIS OF ADHESIONS;  Surgeon: Alphonsa Overall, MD;  [facility];  Service: General;  Laterality: N/A;  ERAS PATHWAY  . ESOPHAGOGASTRODUODENOSCOPY N/A 05/31/2017    Procedure: ESOPHAGOGASTRODUODENOSCOPY (EGD);  Surgeon: Milus Banister, MD;  Location: [facility];  Service: Endoscopy;  Laterality: N/A;  . gamma knife radiation surgery  05/2010   for pituitary  . IR RADIOLOGIST EVAL & MGMT  02/12/2017  . IR RADIOLOGIST EVAL & MGMT  03/17/2017  . IR RADIOLOGIST EVAL & MGMT  04/14/2017  . IR RADIOLOGIST EVAL & MGMT  02/05/2017  . PITUITARY SURGERY  2007   nose approach  . ROBOT ASSISTED LAPAROSCOPIC NEPHRECTOMY  04/23/2012   Procedure: ROBOTIC ASSISTED LAPAROSCOPIC NEPHRECTOMY;  Surgeon: Alexis Frock, MD;  [facility];  Service: Urology;  Laterality: Left;  radical  . stomach peg  02/1993   removed 5-6 years later  . TRACHEOSTOMY TUBE PLACEMENT  02/1993   removed 04/1993  . UMBILICAL HERNIA REPAIR  04/23/2012   Procedure: HERNIA REPAIR UMBILICAL ADULT;  Surgeon: Alexis Frock, MD;  [facility];  Service: Urology;;  . [name]  . vocal cord surgery     injected with collagen, then fat from stomach to improve speech s/p stroke    Family History  Problem Relation Age of Onset  . Alcohol abuse Father   . Throat cancer Father   . Esophageal cancer Father 38  . Arthritis Mother   . Hyperlipidemia Mother   . Uterine cancer Mother 66  . Colon cancer Brother   . Arthritis Maternal Grandmother   . Hypertension Brother   . Stroke Paternal Grandmother     SOCIAL HX: Non-smoker.  No alcohol use.   Current Outpatient Medications:  .  acetaminophen (TYLENOL) 500 MG tablet, Take 1 tablet (500 mg total) by mouth every 6 (six) hours as needed for mild pain, moderate pain, fever or headache., Disp: 30 tablet, Rfl: 0 .  albuterol (PROVENTIL HFA;VENTOLIN HFA) 108 (90 BASE) MCG/ACT inhaler, Inhale 2 puffs into the lungs every 6 (six) hours as needed for wheezing or shortness of breath., Disp: 1 Inhaler, Rfl: 0 .  BD DISP NEEDLES 18G X 1-1/2" MISC, INJECT TESTOSTERONE INTO THE MUSCLE AS DIRECTED EVERY 14 DAYS, Disp: 6 each, Rfl: 0 .  cetirizine  (ZYRTEC) 10 MG tablet, Take 10 mg by mouth daily with breakfast., Disp: , Rfl:  .  escitalopram (LEXAPRO) 10 MG tablet, TAKE 1 TABLET BY MOUTH IN THE MORNING, Disp: 90 tablet, Rfl: 0 .  furosemide (LASIX) 20 MG tablet, Take 1 tablet (20 mg total) by mouth daily. (Patient taking differently: Take 20 mg by mouth daily as needed for fluid. ), Disp: 30 tablet, Rfl: 0 .  ketoconazole (NIZORAL) 2 % cream, APPLY AS NEEDED TO RASH (Patient taking differently: Apply to groin or legs daily as needed for rash), Disp: 60 g, Rfl: 1 .  levothyroxine (SYNTHROID, LEVOTHROID) 25 MCG tablet, TAKE 1 TABLET BY MOUTH DAILY BEFORE BREAKFAST, Disp: 90 tablet, Rfl: 3 .  lisinopril-hydrochlorothiazide (ZESTORETIC) 20-12.5 MG tablet, Take 1 tablet by mouth daily., Disp: 30 tablet, Rfl: 11 .  Magnesium 250 MG TABS, Take 1 tablet by mouth daily., Disp: , Rfl:  .  Melatonin 3 MG TABS, Take 3 mg by mouth at bedtime as needed (for sleep)., Disp: , Rfl:  .  Multiple Vitamin (MULTIVITAMIN WITH MINERALS) TABS tablet, Take 1 tablet by mouth daily with breakfast. , Disp: , Rfl:  .  pantoprazole (PROTONIX) 40 MG tablet, TAKE 1 TABLET BY MOUTH ONCE DAILY.  VIRTUAL VISIT REQUIRED FOR FUTURE REFILLS, Disp: 30 tablet, Rfl: 0 .  SAFETY-LOK 3CC SYR 22GX1.5" 22G X 1-1/2" 3 ML MISC, INJECT 1 ML OF TESTOSTERONE EVERY 14 DAYS, Disp: 6 each, Rfl: 0 .  tamsulosin (FLOMAX) 0.4 MG CAPS capsule, Take 1 capsule (0.4 mg total) by mouth daily., Disp: 30 capsule, Rfl: 3 .  testosterone enanthate (DELATESTRYL) 200 MG/ML injection, INJECT 1 ML INTRAMUSCULARLY  EVERY TWO WEEKS, Disp: 10 mL, Rfl: 1 .  traMADol (ULTRAM) 50 MG tablet, Take 1 tablet (50 mg total) by mouth every 6 (six) hours as needed., Disp: 60 tablet, Rfl: 0 .  traZODone (DESYREL) 100 MG tablet, TAKE 1 TABLET BY MOUTH AT BEDTIME, Disp: 90 tablet, Rfl: 0 .  triamcinolone cream (KENALOG) 0.1 %, Apply 1 application topically 2 (two) times daily as needed (for rash). Applies to face, Disp: , Rfl:    EXAM:  VITALS per patient if applicable:  GENERAL: alert, oriented, appears well and in no acute distress  HEENT: atraumatic, conjunttiva clear, no obvious abnormalities on inspection of external nose and ears  NECK: normal movements of the head and neck  LUNGS: on inspection no signs of respiratory distress, breathing rate appears normal, no obvious gross SOB, gasping or wheezing  CV: no obvious cyanosis  MS: moves all visible extremities without noticeable abnormality  PSYCH/NEURO: pleasant and cooperative, no obvious depression or anxiety, speech and thought processing grossly intact  ASSESSMENT AND PLAN:  Discussed the following assessment and plan:  #1 hypertension with several recent severe elevated readings -Stop plain lisinopril and start lisinopril HCTZ 20/12.5 mg 1 daily -Watch sodium intake -They have scheduled follow-up in early December.  Recheck basic metabolic panel at that visit -Follow-up sooner if blood pressure not improving some by next week  #2 generalized weakness.  Probably related to his metastatic renal cell carcinoma.  Recent TSH at goal.  Did have recent hemoglobin dropped 11.9 compared with previous of 15.  Recheck at follow-up    I discussed the assessment and treatment plan with the patient. The patient was provided an opportunity to ask questions and all were answered. The patient agreed with the plan and demonstrated an understanding of the instructions.   The patient was advised to call back or seek an in-person evaluation if the symptoms worsen or if the condition fails to improve as anticipated.    Carolann Littler, MD

## 2019-06-28 NOTE — Telephone Encounter (Signed)
Please see message. Patient has a virtual today at 1:15pm.

## 2019-06-30 DIAGNOSIS — Z933 Colostomy status: Secondary | ICD-10-CM | POA: Diagnosis not present

## 2019-07-04 ENCOUNTER — Other Ambulatory Visit: Payer: Self-pay | Admitting: Internal Medicine

## 2019-07-05 ENCOUNTER — Telehealth: Payer: Self-pay | Admitting: Oncology

## 2019-07-05 NOTE — Telephone Encounter (Signed)
Called patient regarding providers request, per patient's request 12/04 appointment has moved to 1/08. Follow-up will be a virtual visit.

## 2019-07-15 ENCOUNTER — Other Ambulatory Visit: Payer: PPO

## 2019-07-15 ENCOUNTER — Ambulatory Visit: Payer: PPO | Admitting: Oncology

## 2019-07-18 ENCOUNTER — Ambulatory Visit: Payer: PPO | Admitting: Family Medicine

## 2019-07-21 ENCOUNTER — Other Ambulatory Visit: Payer: Self-pay

## 2019-07-21 ENCOUNTER — Other Ambulatory Visit: Payer: Self-pay | Admitting: Family Medicine

## 2019-07-22 ENCOUNTER — Other Ambulatory Visit: Payer: Self-pay

## 2019-07-22 ENCOUNTER — Ambulatory Visit (INDEPENDENT_AMBULATORY_CARE_PROVIDER_SITE_OTHER): Payer: PPO | Admitting: Family Medicine

## 2019-07-22 ENCOUNTER — Encounter: Payer: Self-pay | Admitting: Family Medicine

## 2019-07-22 VITALS — BP 160/92 | HR 70 | Temp 97.5°F

## 2019-07-22 DIAGNOSIS — D649 Anemia, unspecified: Secondary | ICD-10-CM | POA: Diagnosis not present

## 2019-07-22 DIAGNOSIS — I10 Essential (primary) hypertension: Secondary | ICD-10-CM

## 2019-07-22 DIAGNOSIS — R531 Weakness: Secondary | ICD-10-CM

## 2019-07-22 DIAGNOSIS — R5383 Other fatigue: Secondary | ICD-10-CM

## 2019-07-22 DIAGNOSIS — R7989 Other specified abnormal findings of blood chemistry: Secondary | ICD-10-CM | POA: Diagnosis not present

## 2019-07-22 LAB — BASIC METABOLIC PANEL
BUN: 41 mg/dL — ABNORMAL HIGH (ref 6–23)
CO2: 19 mEq/L (ref 19–32)
Calcium: 9 mg/dL (ref 8.4–10.5)
Chloride: 101 mEq/L (ref 96–112)
Creatinine, Ser: 1.25 mg/dL (ref 0.40–1.50)
GFR: 58 mL/min — ABNORMAL LOW (ref >60.00)
Glucose, Bld: 110 mg/dL — ABNORMAL HIGH (ref 70–99)
Potassium: 4.9 mEq/L (ref 3.5–5.1)
Sodium: 132 mEq/L — ABNORMAL LOW (ref 135–145)

## 2019-07-22 LAB — CBC WITH DIFFERENTIAL/PLATELET
Basophils Absolute: 0.1 10*3/uL (ref 0.0–0.1)
Basophils Relative: 0.8 % (ref 0.0–3.0)
Eosinophils Absolute: 0.2 10*3/uL (ref 0.0–0.7)
Eosinophils Relative: 2.1 % (ref 0.0–5.0)
HCT: 42.1 % (ref 39.0–52.0)
Hemoglobin: 14.1 g/dL (ref 13.0–17.0)
Lymphocytes Relative: 9.5 % — ABNORMAL LOW (ref 12.0–46.0)
Lymphs Abs: 0.9 10*3/uL (ref 0.7–4.0)
MCHC: 33.4 g/dL (ref 30.0–36.0)
MCV: 88.8 fl (ref 78.0–100.0)
Monocytes Absolute: 0.9 10*3/uL (ref 0.1–1.0)
Monocytes Relative: 9.4 % (ref 3.0–12.0)
Neutro Abs: 7.6 10*3/uL (ref 1.4–7.7)
Neutrophils Relative %: 78.2 % — ABNORMAL HIGH (ref 43.0–77.0)
Platelets: 198 10*3/uL (ref 150.0–400.0)
RBC: 4.74 Mil/uL (ref 4.22–5.81)
RDW: 15.6 % — ABNORMAL HIGH (ref 11.5–15.5)
WBC: 9.8 10*3/uL (ref 4.0–10.5)

## 2019-07-22 LAB — TSH: TSH: 3.1 u[IU]/mL (ref 0.35–4.50)

## 2019-07-22 LAB — FERRITIN: Ferritin: 58.7 ng/mL (ref 22.0–322.0)

## 2019-07-22 LAB — TESTOSTERONE: Testosterone: 559.64 ng/dL (ref 300.00–890.00)

## 2019-07-22 MED ORDER — TESTOSTERONE ENANTHATE 200 MG/ML IM SOLN: INTRAMUSCULAR | 1 refills | Status: DC

## 2019-07-22 MED ORDER — AMLODIPINE BESYLATE 5 MG PO TABS: 5.0000 mg | ORAL_TABLET | Freq: Every day | ORAL | 3 refills | Status: DC

## 2019-07-22 NOTE — Progress Notes (Signed)
Subjective:     Patient ID: Austin [name], male   DOB: 12/26/1955, 63 y.o.   MRN: [id number]  HPI Bill seen for medical follow-up.  He has multiple chronic problems including metastatic renal cell carcinoma, history of brainstem CVA, history of pituitary tumor.  We addressed several things today as follows  Hypertension.  He has had several home elevated readings for quite some time.  Back in September we switched him from plain lisinopril to lisinopril HCTZ.  He has seen some improvement in leg and ankle edema but blood pressure still running around 160 systolic and 90 diastolic.  He has had actually some gradual mild weight loss and poor appetite.  He continues have chronic fatigue.  As per previous note, he had drop in hemoglobin from 15-11.9.  No obvious bloody stools.  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  He takes testosterone 200 mg IM every 2 weeks.  Needs follow-up levels.  He has hypothyroidism and is on replacement.  Requesting repeat TSH  Past Medical History:  Diagnosis Date  . Brainstem stroke (HCC) 10/16/2008   Qualifier: Diagnosis of  By: Neckers LPN, Nancy    . C. difficile colitis 02/08/2017  . Cerebrovascular accident (HCC) 1994   hx of brainstem stroke, residual diminished lung capacity  . Chronic kidney disease    Left Renal Mass  . Colostomy stricture (HCC)   . Diverticulitis   . Duodenal ulcer   . GIB (gastrointestinal bleeding) 05/28/2017  . Hx of colonic polyps   . Hypertension    has been off BP meds since GI bleed was thought to have been caused by previously prescribed lisinopril   . Hypogonadism   . Intra-abdominal abscess (HCC) 01/23/2017  . Iron deficiency anemia   . 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 Neuromuscular disorder (HCC)    quadraplegic  . Open abdominal incision with drainage 01/26/2017  . Pituitary adenoma (HCC)   . 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 Pneumonia   . PONV (postoperative nausea and vomiting)    vomited after colostomy placement   . Pre-diabetes    last a1c 5.6   . 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HCC)   . Urinary incontinence   . Venous stasis    edema   Past Surgical History:  Procedure Laterality Date  . CHOLECYSTECTOMY    . COLON RESECTION N/A 05/04/2017   Procedure: LAPAROSCOPIC SIGMOID COLON RESECTION WITH END COLSOTOMY ERAS PATHWAY;  Surgeon: Newman, David, MD;  Location: [facility];  Service: General;  Laterality: N/A;  . COLONOSCOPY  02/27/2012   Procedure: COLONOSCOPY;  Surgeon: Jay M Pyrtle, MD;  Location: [facility];  Service: Gastroenterology;  Laterality: N/A;  . COLONOSCOPY N/A 05/31/2017   Procedure: COLONOSCOPY;  Surgeon: Jacobs, Daniel P, MD;  Location: [facility];  Service: Endoscopy;  Laterality: N/A;  . COLOSTOMY TAKEDOWN N/A 08/20/2017   Procedure: LAPAROSCOPIC ASSISTED REVISION END COLOSTOMY AND ENTEROLYSIS OF ADHESIONS;  Surgeon: Newman, David, MD;  Location: [facility];  Service: General;  Laterality: N/A;  ERAS PATHWAY  . ESOPHAGOGASTRODUODENOSCOPY N/A 05/31/2017   Procedure: ESOPHAGOGASTRODUODENOSCOPY (EGD);  Surgeon: Jacobs, Daniel P, MD;  Location: [facility];  Service: Endoscopy;  Laterality: N/A;  . gamma knife radiation surgery  05/2010   for pituitary  . IR RADIOLOGIST EVAL & MGMT  02/12/2017  . IR RADIOLOGIST EVAL & MGMT  03/17/2017  . IR   RADIOLOGIST EVAL & MGMT  04/14/2017  . IR RADIOLOGIST EVAL & MGMT  02/05/2017  . PITUITARY SURGERY  2007   nose approach  . ROBOT ASSISTED LAPAROSCOPIC NEPHRECTOMY  04/23/2012   Procedure: ROBOTIC ASSISTED LAPAROSCOPIC NEPHRECTOMY;  Surgeon: Theodore Manny, MD;  Location: [facility];  Service: Urology;  Laterality: Left;  radical  . stomach peg  02/1993   removed 5-6 years later  . TRACHEOSTOMY TUBE PLACEMENT  02/1993   removed 04/1993  . UMBILICAL HERNIA REPAIR  04/23/2012   Procedure: HERNIA REPAIR UMBILICAL ADULT;   Surgeon: Theodore Manny, MD;  Location: [facility];  Service: Urology;;  . VASECTOMY  1984  . vocal cord surgery     injected with collagen, then fat from stomach to improve speech s/p stroke    reports that he quit smoking about 26 years ago. His smoking use included cigarettes. He has a 11.50 pack-year smoking history. He has never used smokeless tobacco. He reports that he does not drink alcohol or use drugs. family history includes Alcohol abuse in his father; Arthritis in his maternal grandmother and mother; Colon cancer in his brother; Esophageal cancer (age of onset: 60) in his father; Hyperlipidemia in his mother; Hypertension in his brother; Stroke in his paternal grandmother; Throat cancer in his father; Uterine cancer (age of onset: 58) in his mother. Allergies  Allergen Reactions  . Penicillins Rash and Other (See Comments)    Has patient had a PCN reaction causing immediate rash, facial/tongue/throat swelling, SOB or lightheadedness with hypotension: YES Has patient had a PCN reaction causing severe rash involving mucus membranes or skin necrosis: No Has patient had a PCN reaction that required hospitalization No Has patient had a PCN reaction occurring within the last 10 years: Yes  If all of the above answers are "NO", then may proceed with Cephalosporin use.     Review of Systems  Constitutional: Positive for fatigue. Negative for chills and fever.  Eyes: Negative for visual disturbance.  Respiratory: Negative for cough, chest tightness and shortness of breath.   Cardiovascular: Negative for chest pain, palpitations and leg swelling.  Gastrointestinal: Negative for abdominal pain.  Neurological: Positive for weakness. Negative for dizziness, syncope, light-headedness and headaches.       Objective:   Physical Exam Vitals reviewed.  Constitutional:      Appearance: Normal appearance.  Cardiovascular:     Rate and Rhythm: Normal rate and regular rhythm.  Pulmonary:      Effort: Pulmonary effort is normal.     Breath sounds: Normal breath sounds.  Musculoskeletal:     Right lower leg: No edema.     Left lower leg: No edema.  Neurological:     Mental Status: He is alert.        Assessment:     #1 hypertension poorly controlled  #2 history of low testosterone on replacement  #3 history of mild anemia from most recent labs  #4 hypothyroidism-on replacement  #5 progressive fatigue and generalized weakness.  Difficult to sort out how much of this is related to his metastatic renal cell versus other.  #6 metastatic renal cell carcinoma.  They have elected against any further treatments at this point and remain comfortable with this decision    Plan:     -Check further labs with basic metabolic panel, CBC, ferritin, TIBC, serum iron, TSH -Start amlodipine 5 mg once daily and continue to monitor blood pressures closely -We recommended 3-month follow-up to reassess and then will be in touch   sooner if not seeing improvements with blood pressure over the next couple weeks   W  MD [facility] [place]     

## 2019-07-23 LAB — IRON, TOTAL/TOTAL IRON BINDING CAP
%SAT: 30 % (calc) (ref 20–48)
Iron: 80 ug/dL (ref 50–180)
TIBC: 269 mcg/dL (calc) (ref 250–425)

## 2019-07-26 ENCOUNTER — Other Ambulatory Visit: Payer: Self-pay | Admitting: Family Medicine

## 2019-07-26 DIAGNOSIS — R32 Unspecified urinary incontinence: Secondary | ICD-10-CM | POA: Diagnosis not present

## 2019-08-15 ENCOUNTER — Telehealth: Payer: Self-pay | Admitting: Oncology

## 2019-08-15 NOTE — Telephone Encounter (Signed)
Returned patient's phone call regarding cancelling 01/08 appointments. Per 01/04 scheduled message, appointment cancelled. Left a voicemail for patient to reschedule.

## 2019-08-15 NOTE — Telephone Encounter (Signed)
Patient's wife called to reschedule 01/08 appointments, it has been rescheduled for 02/05.

## 2019-08-19 ENCOUNTER — Telehealth: Payer: PPO | Admitting: Oncology

## 2019-08-19 ENCOUNTER — Other Ambulatory Visit: Payer: PPO

## 2019-08-29 DIAGNOSIS — Z933 Colostomy status: Secondary | ICD-10-CM | POA: Diagnosis not present

## 2019-09-01 ENCOUNTER — Other Ambulatory Visit: Payer: Self-pay | Admitting: Oncology

## 2019-09-15 ENCOUNTER — Telehealth: Payer: Self-pay | Admitting: Family Medicine

## 2019-09-15 NOTE — Chronic Care Management (AMB) (Signed)
Chronic Care Management   Note  09/15/2019 Name: Austin [name] MRN: [id number] DOB: 1955-08-21  Austin [name] is a 64 y.o. year old male who is a primary care patient of [name], [name], MD. I reached out to Talbert Cage by phone today in response to a referral sent by Austin [name]'s PCP, [name], MD.  Spoke with Mr. Emanuele wife.  Mr. Borthwick was given information about Chronic Care Management services today including:  1. CCM service includes personalized support from designated clinical staff supervised by his physician, including individualized plan of care and coordination with other care providers 2. 24/7 contact phone numbers for assistance for urgent and routine care needs. 3. Service will only be billed when office clinical staff spend 20 minutes or more in a month to coordinate care. 4. Only one practitioner may furnish and bill the service in a calendar month. 5. The patient may stop CCM services at any time (effective at the end of the month) by phone call to the office staff. 6. The patient will be responsible for cost sharing (co-pay) of up to 20% of the service fee (after annual deductible is met).  Patient agreed to services and verbal consent obtained.   Follow up plan:  [place]

## 2019-09-15 NOTE — Chronic Care Management (AMB) (Signed)
Chronic Care Management Pharmacy  Name: Austin [name]  MRN: [id number] DOB: [birth year]  Initial Questions: 1. Have you seen any other providers since your last visit? No  2. Any changes in your medicines or health? No   Chief Complaint/ HPI  Austin [name],  64 y.o. , male presents for their Initial CCM visit with the clinical pharmacist via [place].  PCP : Austin Post, MD  Their chronic conditions include: HTN, Hypothyroidism  Office Visits:  07/22/2019: Austin [name]- Patient presented OV for follow up. Due to uncontrolled HTN. Amlodpine 5mg  once daily was initiated with plan to monitor BP readings.   06/28/2019: Austin [name]- Patient presented via Telemedicine for HTN and generalized weakness follow up. Patient switched from lisinopril (monotherapy) to lisinopril/ HCTZ 20/12.5mg , 1 tab daily. Patient counseled to watch sodium intake. Patient also presented for generalized weakness; possible relation to metastatic renal cell carcinoma. Plan for BMP and hemoglobin recheck on Dec appointment.   05/02/2019: Austin [name]- patient presented for OV for ecchymoses, spontaneous. Large right anterior chest wall hematoma found with extensive dependent ecchymoses. CBC checked and counseled to consider low-grade heat (I.e. heating pad).   04/15/2019: Austin [name]- patient presented for OV for fatigue. Mass on left arm suspected to be related to renal cell carcinoma. TSH and 25 hydroxy vitamin D level check. Flu vaccine was administered. Patient decided against MRI and radiation even if mass resulted malignant.   10/13/2018: Austin [name]- patient presented for OV for painful urination. Patient's A1c controlled: 5.9%. Patient has a history of neurogenic bladder. Frequent UTIs. Urine dipstick ordered and patient started on Levaquin 500mg  daily for 7 days. Hypertension controlled at this visit. Plan for follow-up in 3 months.   Consult  Visit: 03/15/2019: Austin [name]- patient presented OV for carcinoma of kidney (repeat evaluation). Patient opted against systemic therapy and only on active surveillance.    Medications: Outpatient Encounter Medications as of 09/16/2019  Medication Sig Note  . acetaminophen (TYLENOL) 500 MG tablet Take 1 tablet (500 mg total) by mouth every 6 (six) hours as needed for mild pain, moderate pain, fever or headache.   . albuterol (PROVENTIL HFA;VENTOLIN HFA) 108 (90 BASE) MCG/ACT inhaler Inhale 2 puffs into the lungs every 6 (six) hours as needed for wheezing or shortness of breath.   Austin [name] amLODipine (NORVASC) 5 MG tablet Take 1 tablet (5 mg total) by mouth daily.   . BD DISP NEEDLES 18G X 1-1/2" MISC USE TO INJECT TESTOSTERONE INTO THE MUSCLE AS DIRECTED EVERY 14 DAYS   . escitalopram (LEXAPRO) 10 MG tablet TAKE 1 TABLET BY MOUTH IN THE MORNING   . ketoconazole (NIZORAL) 2 % cream APPLY AS NEEDED TO RASH (Patient taking differently: Apply to groin or legs daily as needed for rash)   . levothyroxine (SYNTHROID, LEVOTHROID) 25 MCG tablet TAKE 1 TABLET BY MOUTH DAILY BEFORE BREAKFAST   . lisinopril-hydrochlorothiazide (ZESTORETIC) 20-12.5 MG tablet Take 1 tablet by mouth daily.   . Melatonin 3 MG TABS Take 3 mg by mouth at bedtime as needed (for sleep).   . Multiple Vitamin (MULTIVITAMIN WITH MINERALS) TABS tablet Take 1 tablet by mouth daily with breakfast.  08/17/2017: Not taking for surgery  . SAFETY-LOK 3CC SYR 22GX1.5" 22G X 1-1/2" 3 ML MISC USE TO INJECT 1 ML OF TESTOSTERONE EVERY 14 DAYS   . testosterone enanthate (DELATESTRYL) 200 MG/ML injection INJECT 1 ML INTRAMUSCULARLY  EVERY TWO WEEKS   . testosterone enanthate (DELATESTRYL) 200  MG/ML injection INJECT 1 ML INTRAMUSCULARLY  EVERY TWO WEEKS   . traMADol (ULTRAM) 50 MG tablet TAKE 1 TABLET BY MOUTH EVERY 6 HOURS AS NEEDED   . traZODone (DESYREL) 100 MG tablet TAKE 1 TABLET BY MOUTH AT BEDTIME   . triamcinolone cream (KENALOG) 0.1 % Apply 1  application topically 2 (two) times daily as needed (for rash). Applies to face   . cetirizine (ZYRTEC) 10 MG tablet Take 10 mg by mouth daily with breakfast.   . furosemide (LASIX) 20 MG tablet Take 1 tablet (20 mg total) by mouth daily. (Patient not taking: Reported on 09/16/2019)   . Magnesium 250 MG TABS Take 1 tablet by mouth daily.   . pantoprazole (PROTONIX) 40 MG tablet TAKE 1 TABLET BY MOUTH ONCE DAILY.  VIRTUAL VISIT REQUIRED FOR FUTURE REFILLS (Patient not taking: Reported on 09/16/2019)   . tamsulosin (FLOMAX) 0.4 MG CAPS capsule Take 1 capsule (0.4 mg total) by mouth daily. (Patient not taking: Reported on 09/16/2019)    No facility-administered encounter medications on file as of 09/16/2019.     Current Diagnosis/Assessment:  Goals Addressed            This Visit's Progress   . Pharmacy Care Plan       Current Barriers:  . Chronic Disease Management support, education, and care coordination needs related to HTN  Pharmacist Clinical Goal(s):  Austin [name] Over the next 30 days, patient will continue checking blood pressure and reporting blood pressure readings.   Interventions: . Discussed reduction in sodium intake.  Austin [name] Collaboration with provider re: medication management  Patient Self Care Activities:  . Patient verbalizes understanding of plan to continuing blood pressure regimen.  Initial goal documentation        Hypertension   BP today is:  <140/90  Office blood pressures are  BP Readings from Last 3 Encounters:  09/16/19 (!) 167/74  07/22/19 (!) 160/92  05/02/19 (!) 146/90    Patient has failed these meds in the past: Lisinopril 10mg , 1 tab once daily  Patient checks BP at home 3-5x per week  Patient home BP readings are ranging: at bedtime.  2/2: 154/83 2/1: 160/85  1/31: 160/84   Patient not controlled on: lisinopril/ HCTZ 20/12.5mg , 1 tab daily and amlodipine 5mg .   Plan  Continue current medications. Counseled on reduction of sodium intake. Discussed  with Dr. Elease [name] and recommended to increase amlodipine 5mg  to 10mg , 1 tab once daily. Monitor ankle edema as patient has history of this. Monitoring plan set in place to follow-up on blood pressure readings in 1 month.  Hypothyroidism   Patient has failed these meds in past: none Patient is currently controlled on the following medications: levothyroxine 30mcg, 1 tab once daily.  Recent TSH: 3.10 (07/22/2019) Past TSH: 4.19 (04/15/2019)  We discussed:  continue current medication. Patient mentions cold chills and not sure if related to renal cancer. Patient has follow up appointment with oncologist today (2/05) and will follow up with provider.  Plan  Continue current medications.   Lower extremity edema   Patient has failed these meds in past: none Patient is currently controlled on the following medications: currently not taking any medications. Has furosemide on hand, but has not been taking.  We discussed:  sodium intake reduction. Patient has compression stockings, but states swelling is controlled and has no need for them currently.  Plan  Continue diet modifications.  Mood    Patient has failed these meds in past: none Patient is  currently controlled on the following medications: escitalopram 10mg , 1 tab daily  Plan  Continue current medications   Insomnia   Patient has failed these meds in past: none Patient is currently controlled on the following medications: Melatonin 3mg  as needed and Trazodone 100mg  as needed  We discussed:  patient does not use these consistently. Discussed use of Trazodone sparingly and interactions with Tramadol.  Plan  Continue current medications.  Pain related to renal carcinoma   Patient has failed these meds in past: none Patient is currently controlled on the following medications:Tylenol (APAP) 500mg  as needed and Tramadol 50mg  as needed.    We discussed:  increased risk of CNS and respiration depression of Tramadol and  Trazodone. Patient/ spouse mentions taking Tylenol first for any pain and will take a dose of Tramadol for days when patient is moved more often.   Plan Continue current medications.   Wheezing    Patient has failed these meds in past: none Patient is currently controlled on the following medications: albuterol HFA inhaler prn wheezing  Plan Spouse would like refill for albuterol inhaler. Spouse notes slight wheezing, and would like inhaler to have on hand.    Anson Crofts, PharmD Clinical Pharmacist [facility] [place]

## 2019-09-16 ENCOUNTER — Inpatient Hospital Stay: Payer: PPO | Attending: Oncology

## 2019-09-16 ENCOUNTER — Inpatient Hospital Stay (HOSPITAL_BASED_OUTPATIENT_CLINIC_OR_DEPARTMENT_OTHER): Payer: PPO | Admitting: Oncology

## 2019-09-16 ENCOUNTER — Ambulatory Visit: Payer: PPO

## 2019-09-16 ENCOUNTER — Other Ambulatory Visit: Payer: Self-pay | Admitting: Family Medicine

## 2019-09-16 ENCOUNTER — Other Ambulatory Visit: Payer: Self-pay

## 2019-09-16 VITALS — BP 167/74 | HR 86 | Temp 98.7°F | Resp 20 | Ht 72.0 in

## 2019-09-16 DIAGNOSIS — C649 Malignant neoplasm of unspecified kidney, except renal pelvis: Secondary | ICD-10-CM

## 2019-09-16 DIAGNOSIS — C7800 Secondary malignant neoplasm of unspecified lung: Secondary | ICD-10-CM | POA: Insufficient documentation

## 2019-09-16 DIAGNOSIS — C786 Secondary malignant neoplasm of retroperitoneum and peritoneum: Secondary | ICD-10-CM | POA: Insufficient documentation

## 2019-09-16 DIAGNOSIS — Z905 Acquired absence of kidney: Secondary | ICD-10-CM | POA: Diagnosis not present

## 2019-09-16 DIAGNOSIS — R5383 Other fatigue: Secondary | ICD-10-CM | POA: Diagnosis not present

## 2019-09-16 DIAGNOSIS — E039 Hypothyroidism, unspecified: Secondary | ICD-10-CM

## 2019-09-16 DIAGNOSIS — R2232 Localized swelling, mass and lump, left upper limb: Secondary | ICD-10-CM | POA: Diagnosis not present

## 2019-09-16 DIAGNOSIS — M6289 Other specified disorders of muscle: Secondary | ICD-10-CM | POA: Diagnosis not present

## 2019-09-16 DIAGNOSIS — I10 Essential (primary) hypertension: Secondary | ICD-10-CM

## 2019-09-16 DIAGNOSIS — Z993 Dependence on wheelchair: Secondary | ICD-10-CM | POA: Insufficient documentation

## 2019-09-16 DIAGNOSIS — Z79899 Other long term (current) drug therapy: Secondary | ICD-10-CM

## 2019-09-16 LAB — CBC WITH DIFFERENTIAL (CANCER CENTER ONLY)
Abs Immature Granulocytes: 0.14 10*3/uL — ABNORMAL HIGH (ref 0.00–0.07)
Basophils Absolute: 0.1 10*3/uL (ref 0.0–0.1)
Basophils Relative: 1 %
Eosinophils Absolute: 0.3 10*3/uL (ref 0.0–0.5)
Eosinophils Relative: 2 %
HCT: 39.8 % (ref 39.0–52.0)
Hemoglobin: 13.1 g/dL (ref 13.0–17.0)
Immature Granulocytes: 1 %
Lymphocytes Relative: 9 %
Lymphs Abs: 1 10*3/uL (ref 0.7–4.0)
MCH: 29.7 pg (ref 26.0–34.0)
MCHC: 32.9 g/dL (ref 30.0–36.0)
MCV: 90.2 fL (ref 80.0–100.0)
Monocytes Absolute: 1 10*3/uL (ref 0.1–1.0)
Monocytes Relative: 9 %
Neutro Abs: 8.6 10*3/uL — ABNORMAL HIGH (ref 1.7–7.7)
Neutrophils Relative %: 78 %
Platelet Count: 187 10*3/uL (ref 150–400)
RBC: 4.41 MIL/uL (ref 4.22–5.81)
RDW: 16.1 % — ABNORMAL HIGH (ref 11.5–15.5)
WBC Count: 11 10*3/uL — ABNORMAL HIGH (ref 4.0–10.5)
nRBC: 0 % (ref 0.0–0.2)

## 2019-09-16 LAB — CMP (CANCER CENTER ONLY)
ALT: 27 U/L (ref 0–44)
AST: 13 U/L — ABNORMAL LOW (ref 15–41)
Albumin: 3.2 g/dL — ABNORMAL LOW (ref 3.5–5.0)
Alkaline Phosphatase: 67 U/L (ref 38–126)
Anion gap: 9 (ref 5–15)
BUN: 31 mg/dL — ABNORMAL HIGH (ref 8–23)
CO2: 22 mmol/L (ref 22–32)
Calcium: 8.8 mg/dL — ABNORMAL LOW (ref 8.9–10.3)
Chloride: 106 mmol/L (ref 98–111)
Creatinine: 1.2 mg/dL (ref 0.61–1.24)
GFR, Est AFR Am: >60 mL/min (ref >60)
GFR, Estimated: >60 mL/min (ref >60)
Glucose, Bld: 142 mg/dL — ABNORMAL HIGH (ref 70–99)
Potassium: 5.1 mmol/L (ref 3.5–5.1)
Sodium: 137 mmol/L (ref 135–145)
Total Bilirubin: 0.3 mg/dL (ref 0.3–1.2)
Total Protein: 7.5 g/dL (ref 6.5–8.1)

## 2019-09-16 NOTE — Progress Notes (Signed)
Hematology and Oncology Follow Up Visit  Austin [name] [id number] [birth year] 64 y.o. 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   Prior Therapy:   1. Austin [name] is status post robotic-assisted laparoscopic nephrectomy on the left done on 04/23/2012. The pathology revealed renal cell carcinoma with a grade 3/4 with the pathological staging of T3a.   2. Votrient 800 mg daily started around 12/27/2013. This was reduced to 400 mg subsequently and have been discontinued since 02/03/2014 due to poor tolerance.  Current therapy: Austin [name] declined any additional therapy currently on active surveillance.  Interim History: Austin [name] is here for a follow-up.  Since the last visit, Austin [name] reports no major changes in Austin [name] health.  Austin [name] continues to have limited quality of life with no major changes.  Austin [name] is wheelchair-bound without any further decline in Austin [name] performance status.  Austin [name] does report some fatigue tiredness as well as cold sensitivity.  Austin [name] does report an increase in Austin [name] mass in the left bicep muscle.  Austin [name] appetite remains reasonable although slowly declining.      Medications: Unchanged on review. Current Outpatient Medications  Medication Sig Dispense Refill  . acetaminophen (TYLENOL) 500 MG tablet Take 1 tablet (500 mg total) by mouth every 6 (six) hours as needed for mild pain, moderate pain, fever or headache. 30 tablet 0  . albuterol (PROVENTIL HFA;VENTOLIN HFA) 108 (90 BASE) MCG/ACT inhaler Inhale 2 puffs into the lungs every 6 (six) hours as needed for wheezing or shortness of breath. 1 Inhaler 0  . amLODipine (NORVASC) 5 MG tablet Take 1 tablet (5 mg total) by mouth daily. 90 tablet 3  . BD DISP NEEDLES 18G X 1-1/2" MISC USE TO INJECT TESTOSTERONE INTO THE MUSCLE AS DIRECTED EVERY 14 DAYS 6 each 0  . cetirizine (ZYRTEC) 10 MG tablet Take 10 mg by mouth  daily with breakfast.    . escitalopram (LEXAPRO) 10 MG tablet TAKE 1 TABLET BY MOUTH IN THE MORNING 90 tablet 0  . furosemide (LASIX) 20 MG tablet Take 1 tablet (20 mg total) by mouth daily. (Patient not taking: Reported on 09/16/2019) 30 tablet 0  . ketoconazole (NIZORAL) 2 % cream APPLY AS NEEDED TO RASH (Patient taking differently: Apply to groin or legs daily as needed for rash) 60 g 1  . levothyroxine (SYNTHROID, LEVOTHROID) 25 MCG tablet TAKE 1 TABLET BY MOUTH DAILY BEFORE BREAKFAST 90 tablet 3  . lisinopril-hydrochlorothiazide (ZESTORETIC) 20-12.5 MG tablet Take 1 tablet by mouth daily. 30 tablet 11  . Magnesium 250 MG TABS Take 1 tablet by mouth daily.    . Melatonin 3 MG TABS Take 3 mg by mouth at bedtime as needed (for sleep).    . Multiple Vitamin (MULTIVITAMIN WITH MINERALS) TABS tablet Take 1 tablet by mouth daily with breakfast.     . pantoprazole (PROTONIX) 40 MG tablet TAKE 1 TABLET BY MOUTH ONCE DAILY.  VIRTUAL VISIT REQUIRED FOR FUTURE REFILLS (Patient not taking: Reported on 09/16/2019) 30 tablet 0  . SAFETY-LOK 3CC SYR 22GX1.5" 22G X 1-1/2" 3 ML MISC USE TO INJECT 1 ML OF TESTOSTERONE EVERY 14 DAYS 6 each 0  . tamsulosin (FLOMAX) 0.4 MG CAPS capsule Take 1 capsule (0.4 mg total) by mouth daily. (Patient not taking: Reported on 09/16/2019) 30 capsule 3  . testosterone enanthate (DELATESTRYL) 200 MG/ML injection INJECT 1 ML INTRAMUSCULARLY  EVERY TWO WEEKS 10  mL 1  . testosterone enanthate (DELATESTRYL) 200 MG/ML injection INJECT 1 ML INTRAMUSCULARLY  EVERY TWO WEEKS 10 mL 1  . traMADol (ULTRAM) 50 MG tablet TAKE 1 TABLET BY MOUTH EVERY 6 HOURS AS NEEDED 60 tablet 0  . traZODone (DESYREL) 100 MG tablet TAKE 1 TABLET BY MOUTH AT BEDTIME 90 tablet 0  . triamcinolone cream (KENALOG) 0.1 % Apply 1 application topically 2 (two) times daily as needed (for rash). Applies to face     No current facility-administered medications for this visit.     Allergies:  Allergies  Allergen  Reactions  . Penicillins Rash and Other (See Comments)    Has patient had a PCN reaction causing immediate rash, facial/tongue/throat swelling, SOB or lightheadedness with hypotension: YES Has patient had a PCN reaction causing severe rash involving mucus membranes or skin necrosis: No Has patient had a PCN reaction that required hospitalization No Has patient had a PCN reaction occurring within the last 10 years: Yes  If all of the above answers are "NO", then may proceed with Cephalosporin use.        Physical Exam: Blood pressure (!) 167/74, pulse 86, temperature 98.7 F (37.1 C), temperature source Temporal, resp. rate 20, height 6' (1.829 m), SpO2 98 %.   ECOG: 2     General appearance: Alert, awake without any distress. Head: Atraumatic without abnormalities Oropharynx: Without any thrush or ulcers. Eyes: No scleral icterus. Lymph nodes: No lymphadenopathy noted in the cervical, supraclavicular, or axillary nodes Heart:regular rate and rhythm, without any murmurs or gallops.   Lung: Clear to auscultation without any rhonchi, wheezes or dullness to percussion. Abdomin: Soft, nontender without any shifting dullness or ascites. Musculoskeletal: Protrusion noted on Austin [name] left bicep muscle increase compared to prior examination. Neurological: No motor or sensory deficits. Skin: No rashes or lesions.       Lab Results: Lab Results  Component Value Date   WBC 9.8 07/22/2019   HGB 14.1 07/22/2019   HCT 42.1 07/22/2019   MCV 88.8 07/22/2019   PLT 198.0 07/22/2019     Chemistry      Component Value Date/Time   NA 132 (L) 07/22/2019 1025   NA 138 03/13/2017 0922   K 4.9 07/22/2019 1025   K 4.6 03/13/2017 0922   CL 101 07/22/2019 1025   CO2 19 07/22/2019 1025   CO2 24 03/13/2017 0922   BUN 41 (H) 07/22/2019 1025   BUN 27.4 (H) 03/13/2017 0922   CREATININE 1.25 07/22/2019 1025   CREATININE 1.34 (H) 03/10/2019 0937   CREATININE 0.87 06/05/2017 1643   CREATININE  1.3 03/13/2017 0922      Component Value Date/Time   CALCIUM 9.0 07/22/2019 1025   CALCIUM 9.3 03/13/2017 0922   ALKPHOS 72 03/10/2019 0937   ALKPHOS 91 03/13/2017 0922   AST 19 03/10/2019 0937   AST 17 03/13/2017 0922   ALT 36 03/10/2019 0937   ALT 21 03/13/2017 0922   BILITOT 0.4 03/10/2019 0937   BILITOT 0.27 03/13/2017 0922        Impression and Plan:  64 year old man with:  1.  Stage IV renal cell carcinoma with peritoneal and lung involvement since 2015.  Austin [name] continues to be on active surveillance without any systemic treatment.  Austin [name] had a trial of anticancer therapy in 2015 and opted against it.  The natural course of this disease as well as indication for systemic treatments were reviewed.  Alternatively continued active surveillance was also discussed.  Options of therapy  will include oral targeted therapy, intravenous immune therapy, as combination treatment.  Austin [name] performance status is marginal and quality of life is limited and I doubt there will be significant improvement in Austin [name] quality of life with cancer treatment.  The time being Austin [name] is okay with continued active surveillance but willing to have another staging scan in 4 months.  2. Prognosis: Austin [name] disease is incurable although Austin [name] has limited quality of life and have opted against anticancer treatment.  This disease has progressed over the years rather slowly but eventually will lead to further morbidities and decline.  3.  Nodule in the left bicep muscle: I recommended obtaining an MRI and consideration for possible palliative radiation therapy if needed.  4. Followup: Will be in 4 months after repeat imaging studies.  30  minutes was spent on this encounter.  Time was dedicated to reviewing Austin [name] disease status, treatment options and addressing complications noted therapy.   Zola Button, MD 2/5/20213:00 PM treated with Marlowe Kays

## 2019-09-16 NOTE — Patient Instructions (Addendum)
Visit Information  Goals Addressed            This Visit's Progress   . Pharmacy Care Plan       Current Barriers:  . Chronic Disease Management support, education, and care coordination needs related to HTN  Pharmacist Clinical Goal(s):  Marland Kitchen Over the next 30 days, patient will continue checking blood pressure and reporting blood pressure readings.   Interventions: . Discussed reduction in sodium intake.  Marland Kitchen Collaboration with provider re: medication management  Patient Self Care Activities:  . Patient verbalizes understanding of plan to continuing blood pressure regimen.  Initial goal documentation        Mr. Benish was given information about Chronic Care Management services today including:  1. CCM service includes personalized support from designated clinical staff supervised by his physician, including individualized plan of care and coordination with other care providers 2. 24/7 contact phone numbers for assistance for urgent and routine care needs. 3. Service will only be billed when office clinical staff spend 20 minutes or more in a month to coordinate care. 4. Only one practitioner may furnish and bill the service in a calendar month. 5. The patient may stop CCM services at any time (effective at the end of the month) by phone call to the office staff. 6. The patient will be responsible for cost sharing (co-pay) of up to 20% of the service fee (after annual deductible is met).  Patient agreed to services and verbal consent obtained.   Print copy of patient instructions provided.  The pharmacy team will reach out to the patient again over the next 30 days.   Anson Crofts, PharmD Clinical Pharmacist [facility]    Hypertension, Adult Hypertension is another name for high blood pressure. High blood pressure forces your heart to work harder to pump blood. This can cause problems over time. There are two numbers in a blood pressure  reading. There is a top number (systolic) over a bottom number (diastolic). It is best to have a blood pressure that is below 120/80. Healthy choices can help lower your blood pressure, or you may need medicine to help lower it. What are the causes? The cause of this condition is not known. Some conditions may be related to high blood pressure. What increases the risk?  Smoking.  Having type 2 diabetes mellitus, high cholesterol, or both.  Not getting enough exercise or physical activity.  Being overweight.  Having too much fat, sugar, calories, or salt (sodium) in your diet.  Drinking too much alcohol.  Having long-term (chronic) kidney disease.  Having a family history of high blood pressure.  Age. Risk increases with age.  Race. You may be at higher risk if you are African American.  Gender. Men are at higher risk than women before age 42. After age 34, women are at higher risk than men.  Having obstructive sleep apnea.  Stress. What are the signs or symptoms?  High blood pressure may not cause symptoms. Very high blood pressure (hypertensive crisis) may cause: ? Headache. ? Feelings of worry or nervousness (anxiety). ? Shortness of breath. ? Nosebleed. ? A feeling of being sick to your stomach (nausea). ? Throwing up (vomiting). ? Changes in how you see. ? Very bad chest pain. ? Seizures. How is this treated?  This condition is treated by making healthy lifestyle changes, such as: ? Eating healthy foods. ? Exercising more. ? Drinking less alcohol.  Your health care provider may  prescribe medicine if lifestyle changes are not enough to get your blood pressure under control, and if: ? Your top number is above 130. ? Your bottom number is above 80.  Your personal target blood pressure may vary. Follow these instructions at home: Eating and drinking   If told, follow the DASH eating plan. To follow this plan: ? Fill one half of your plate at each meal with  fruits and vegetables. ? Fill one fourth of your plate at each meal with whole grains. Whole grains include whole-wheat pasta, brown rice, and whole-grain bread. ? Eat or drink low-fat dairy products, such as skim milk or low-fat yogurt. ? Fill one fourth of your plate at each meal with low-fat (lean) proteins. Low-fat proteins include fish, chicken without skin, eggs, beans, and tofu. ? Avoid fatty meat, cured and processed meat, or chicken with skin. ? Avoid pre-made or processed food.  Eat less than 1,500 mg of salt each day.  Do not drink alcohol if: ? Your doctor tells you not to drink. ? You are pregnant, may be pregnant, or are planning to become pregnant.  If you drink alcohol: ? Limit how much you use to:  0-1 drink a day for women.  0-2 drinks a day for men. ? Be aware of how much alcohol is in your drink. In the [place] one drink equals one 12 oz bottle of beer (355 mL), one 5 oz glass of wine (148 mL), or one 1 oz glass of hard liquor (44 mL). Lifestyle   Work with your doctor to stay at a healthy weight or to lose weight. Ask your doctor what the best weight is for you.  Get at least 30 minutes of exercise most days of the week. This may include walking, swimming, or biking.  Get at least 30 minutes of exercise that strengthens your muscles (resistance exercise) at least 3 days a week. This may include lifting weights or doing Pilates.  Do not use any products that contain nicotine or tobacco, such as cigarettes, e-cigarettes, and chewing tobacco. If you need help quitting, ask your doctor.  Check your blood pressure at home as told by your doctor.  Keep all follow-up visits as told by your doctor. This is important. Medicines  Take over-the-counter and prescription medicines only as told by your doctor. Follow directions carefully.  Do not skip doses of blood pressure medicine. The medicine does not work as well if you skip doses. Skipping doses also puts you at  risk for problems.  Ask your doctor about side effects or reactions to medicines that you should watch for. Contact a doctor if you:  Think you are having a reaction to the medicine you are taking.  Have headaches that keep coming back (recurring).  Feel dizzy.  Have swelling in your ankles.  Have trouble with your vision. Get help right away if you:  Get a very bad headache.  Start to feel mixed up (confused).  Feel weak or numb.  Feel faint.  Have very bad pain in your: ? Chest. ? Belly (abdomen).  Throw up more than once.  Have trouble breathing. Summary  Hypertension is another name for high blood pressure.  High blood pressure forces your heart to work harder to pump blood.  For most people, a normal blood pressure is less than 120/80.  Making healthy choices can help lower blood pressure. If your blood pressure does not get lower with healthy choices, you may need to take medicine.  This information is not intended to replace advice given to you by your health care provider. Make sure you discuss any questions you have with your health care provider. Document Revised: 04/07/2018 Document Reviewed: 04/07/2018 Elsevier Patient Education  2020 [employer]  Hypothyroidism  Hypothyroidism is when the thyroid gland does not make enough of certain hormones (it is underactive). The thyroid gland is a small gland located in the lower front part of the neck, just in front of the windpipe (trachea). This gland makes hormones that help control how the body uses food for energy (metabolism) as well as how the heart and brain function. These hormones also play a role in keeping your bones strong. When the thyroid is underactive, it produces too little of the hormones thyroxine (T4) and triiodothyronine (T3). What are the causes? This condition may be caused by:  Hashimoto's disease. This is a disease in which the body's disease-fighting system (immune system) attacks the  thyroid gland. This is the most common cause.  Viral infections.  Pregnancy.  Certain medicines.  Birth defects.  Past radiation treatments to the head or neck for cancer.  Past treatment with radioactive iodine.  Past exposure to radiation in the environment.  Past surgical removal of part or all of the thyroid.  Problems with a gland in the center of the brain (pituitary gland).  Lack of enough iodine in the diet. What increases the risk? You are more likely to develop this condition if:  You are male.  You have a family history of thyroid conditions.  You use a medicine called lithium.  You take medicines that affect the immune system (immunosuppressants). What are the signs or symptoms? Symptoms of this condition include:  Feeling as though you have no energy (lethargy).  Not being able to tolerate cold.  Weight gain that is not explained by a change in diet or exercise habits.  Lack of appetite.  Dry skin.  Coarse hair.  Menstrual irregularity.  Slowing of thought processes.  Constipation.  Sadness or depression. How is this diagnosed? This condition may be diagnosed based on:  Your symptoms, your medical history, and a physical exam.  Blood tests. You may also have imaging tests, such as an ultrasound or MRI. How is this treated? This condition is treated with medicine that replaces the thyroid hormones that your body does not make. After you begin treatment, it may take several weeks for symptoms to go away. Follow these instructions at home:  Take over-the-counter and prescription medicines only as told by your health care provider.  If you start taking any new medicines, tell your health care provider.  Keep all follow-up visits as told by your health care provider. This is important. ? As your condition improves, your dosage of thyroid hormone medicine may change. ? You will need to have blood tests regularly so that your health care  provider can monitor your condition. Contact a health care provider if:  Your symptoms do not get better with treatment.  You are taking thyroid replacement medicine and you: ? Sweat a lot. ? Have tremors. ? Feel anxious. ? Lose weight rapidly. ? Cannot tolerate heat. ? Have emotional swings. ? Have diarrhea. ? Feel weak. Get help right away if you have:  Chest pain.  An irregular heartbeat.  A rapid heartbeat.  Difficulty breathing. Summary  Hypothyroidism is when the thyroid gland does not make enough of certain hormones (it is underactive).  When the thyroid is underactive, it produces too   little of the hormones thyroxine (T4) and triiodothyronine (T3).  The most common cause is Hashimoto's disease, a disease in which the body's disease-fighting system (immune system) attacks the thyroid gland. The condition can also be caused by viral infections, medicine, pregnancy, or past radiation treatment to the head or neck.  Symptoms may include weight gain, dry skin, constipation, feeling as though you do not have energy, and not being able to tolerate cold.  This condition is treated with medicine to replace the thyroid hormones that your body does not make. This information is not intended to replace advice given to you by your health care provider. Make sure you discuss any questions you have with your health care provider. Document Revised: 07/10/2017 Document Reviewed: 07/08/2017 Elsevier Patient Education  2020 [employer].

## 2019-09-19 ENCOUNTER — Telehealth: Payer: Self-pay | Admitting: Oncology

## 2019-09-19 NOTE — Telephone Encounter (Signed)
Scheduled appt per 2/5 los.  Sent a message to HIM pool to get a calendar mailed out.

## 2019-09-20 ENCOUNTER — Other Ambulatory Visit: Payer: Self-pay | Admitting: Family Medicine

## 2019-09-30 ENCOUNTER — Other Ambulatory Visit: Payer: Self-pay

## 2019-09-30 ENCOUNTER — Telehealth: Payer: Self-pay

## 2019-09-30 MED ORDER — ALBUTEROL SULFATE HFA 108 (90 BASE) MCG/ACT IN AERS: 2.0000 | INHALATION_SPRAY | Freq: Four times a day (QID) | RESPIRATORY_TRACT | 1 refills | Status: DC | PRN

## 2019-09-30 MED ORDER — AMLODIPINE BESYLATE 10 MG PO TABS: 10.0000 mg | ORAL_TABLET | Freq: Every day | ORAL | 3 refills | Status: DC

## 2019-09-30 NOTE — Telephone Encounter (Signed)
Spouse called and requested refills sent to [facility] :  - Amlodipine 10mg , 1 tablet daily    - Albuterol HFA inhaler, 2 puffs every 6 hours PRN wheezing/ shortness of breath  Dose of amlodipine was increased at last CCM visit and Dr. [name] was consulted.  Spouse states no increase in swelling since increasing amlodipine.

## 2019-09-30 NOTE — Telephone Encounter (Signed)
Medications have been sent to the requested pharmacy. 

## 2019-09-30 NOTE — Telephone Encounter (Signed)
Refill amlodipine for 1 year at 10 mg dose Refill albuterol inhaler with 1 additional refill

## 2019-10-07 ENCOUNTER — Ambulatory Visit (HOSPITAL_COMMUNITY)
Admission: RE | Admit: 2019-10-07 | Discharge: 2019-10-07 | Disposition: A | Discharge status: Home / Self Care | Payer: PPO | Source: Ambulatory Visit | Attending: Oncology | Admitting: Oncology

## 2019-10-07 ENCOUNTER — Other Ambulatory Visit: Payer: Self-pay

## 2019-10-07 DIAGNOSIS — C649 Malignant neoplasm of unspecified kidney, except renal pelvis: Secondary | ICD-10-CM

## 2019-10-07 DIAGNOSIS — R2232 Localized swelling, mass and lump, left upper limb: Secondary | ICD-10-CM | POA: Diagnosis not present

## 2019-10-07 DIAGNOSIS — M6289 Other specified disorders of muscle: Secondary | ICD-10-CM | POA: Insufficient documentation

## 2019-10-07 MED ORDER — GADOBUTROL 1 MMOL/ML IV SOLN
7.0000 mL | Freq: Once | INTRAVENOUS | Status: AC | PRN
  Administered 2019-10-07: 7 mL via INTRAVENOUS

## 2019-10-10 ENCOUNTER — Other Ambulatory Visit: Payer: Self-pay | Admitting: Family Medicine

## 2019-10-11 ENCOUNTER — Other Ambulatory Visit: Payer: Self-pay | Admitting: Oncology

## 2019-10-11 DIAGNOSIS — C649 Malignant neoplasm of unspecified kidney, except renal pelvis: Secondary | ICD-10-CM

## 2019-10-11 DIAGNOSIS — M6289 Other specified disorders of muscle: Secondary | ICD-10-CM

## 2019-10-13 NOTE — Progress Notes (Signed)
**Note [place]-Identified via Obfuscation** Radiation Oncology         [phone] ________________________________  Initial outpatient Consultation by telephone as patient was unable to access MyChart video during pandemic precautions   Name: Austin [name] MRN: [id number]  Date: 10/14/2019  DOB: August 12, 1955  MA:[id number], [name], MD  Wyatt Portela, MD   REFERRING PHYSICIAN: Wyatt Portela, MD  DIAGNOSIS:    ICD-10-CM   1. Malignant neoplasm of left upper extremity ([place])  C76.42     CHIEF COMPLAINT: Here to discuss management of metastatic renal cancer  HISTORY OF PRESENT ILLNESS::Austin [name] is a 64 y.o. male who presented with an enlarging left arm mass. He has a history of kidney cancer.  His wife provides most of the history today.  She states that he is a quadriplegic and has difficulty amplifying his voice.  He was initially diagnosed with stage IV renal cell carcinoma in 2013, and then peritoneal and lung involvement in 2015. He was treated with left laparoscopic nephrectomy on 04/23/2012, with pathology showing pT3a renal cell carcinoma, grade 3/4. Then, an abdomen/pelvis CT on 11/18/2013 showed an enlarging retroperitoneal nodule, omental implant, and left adrenal nodule. Pathology of the nodule confirmed metastatic disease. He was started on Votrient in 12/2013 under Dr. Alen Blew, but this was subsequently discontinued due to poor tolerance. He has since declined any additional therapy.  A couple years ago he noted a small "knot" in the left bicep and was told by physicians that it should be watched.  It now feels like a golf ball. Skin looks bruised at times, and the small vessels are more visible in the overlying skin. No ulceration. Has pain associated with it, and itching.   At follow up with Dr. Alen Blew on 09/16/2019, he reported increased size of a mass in his left bicep muscle. He underwent left humerus MRI on 10/07/2019 showing: 3.7 cm enhancing soft tissue mass in anterior aspect of left upper arm, highly  suspicious for metastatic focus given history of metastatic kidney cancer.  His most recent systemic scans on 03/10/2019 showed: interval increase in lobular right retroperitoneal mass; increase in size of smaller right pararenal space nodule; new small peritoneal metastasis in right abdomen; stable spleen lesion; no evidence local recurrence in left nephrectomy bed; stable left adrenal nodule; stable bilateral round pulmonary nodular metastasis; no mediastinal nodal metastasis.  He is quadriplegic - basilar artery dissection at 64 years old; 8. Left arm has had minimal motor function since.  He is not receiving systemic therapy right now --- has been off of in for 5-6 years.   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    PAST SURGICAL HISTORY: Past Surgical History:  Procedure Laterality Date   CHOLECYSTECTOMY     COLON RESECTION N/A 05/04/2017   Procedure: LAPAROSCOPIC SIGMOID COLON RESECTION WITH END COLSOTOMY ERAS PATHWAY;  Surgeon: Alphonsa Overall, MD;  Location: [facility];  Service: General;  Laterality: N/A;   COLONOSCOPY  02/27/2012  Procedure: COLONOSCOPY;  Surgeon: Jerene Bears, MD;  Location: [facility];  Service:  Gastroenterology;  Laterality: N/A;   COLONOSCOPY N/A 05/31/2017   Procedure: COLONOSCOPY;  Surgeon: Milus Banister, MD;  Location: [facility];  Service: Endoscopy;  Laterality: N/A;   COLOSTOMY TAKEDOWN N/A 08/20/2017   Procedure: LAPAROSCOPIC ASSISTED REVISION END COLOSTOMY AND ENTEROLYSIS OF ADHESIONS;  Surgeon: Alphonsa Overall, MD;  Location: [facility];  Service: General;  Laterality: N/A;  ERAS PATHWAY   ESOPHAGOGASTRODUODENOSCOPY N/A 05/31/2017   Procedure: ESOPHAGOGASTRODUODENOSCOPY (EGD);  Surgeon: Milus Banister, MD;  Location: [facility];  Service: Endoscopy;  Laterality: N/A;   gamma knife radiation surgery  05/2010   for pituitary   IR RADIOLOGIST EVAL & MGMT  02/12/2017   IR RADIOLOGIST EVAL & MGMT  03/17/2017   IR RADIOLOGIST EVAL & MGMT  04/14/2017   IR RADIOLOGIST EVAL & MGMT  02/05/2017   PITUITARY SURGERY  2007   nose approach   ROBOT ASSISTED LAPAROSCOPIC NEPHRECTOMY  04/23/2012   Procedure: ROBOTIC ASSISTED LAPAROSCOPIC NEPHRECTOMY;  Surgeon: Alexis Frock, MD;  Location: [facility];  Service: Urology;  Laterality: Left;  radical   stomach peg  02/1993   removed 5-6 years later   [place]  02/1993   removed 37/8588   UMBILICAL HERNIA REPAIR  04/23/2012   Procedure: HERNIA REPAIR UMBILICAL ADULT;  Surgeon: Alexis Frock, MD;  Location: [facility];  Service: Urology;;   VASECTOMY  1984   vocal cord surgery     injected with collagen, then fat from stomach to improve speech s/p stroke    FAMILY HISTORY: family history includes Alcohol abuse in his father; Arthritis in his maternal grandmother and mother; Colon cancer in his brother; Esophageal cancer (age of onset: 43) in his father; Hyperlipidemia in his mother; Hypertension in his brother; Stroke in his paternal grandmother; Throat cancer in his father; Uterine cancer (age of onset: 18) in his mother.  SOCIAL HISTORY:  reports that he quit smoking about 27 years ago. His smoking use included  cigarettes. He has a 11.50 pack-year smoking history. He has never used smokeless tobacco. He reports that he does not drink alcohol or use drugs.  ALLERGIES: Penicillins  MEDICATIONS:  Current Outpatient Medications  Medication Sig Dispense Refill   acetaminophen (TYLENOL) 500 MG tablet Take 1 tablet (500 mg total) by mouth every 6 (six) hours as needed for mild pain, moderate pain, fever or headache. 30 tablet 0   amLODipine (NORVASC) 10 MG tablet Take 1 tablet (10 mg total) by mouth daily. 90 tablet 3   BD DISP NEEDLES 18G X 1-1/2" MISC USE TO INJECT TESTOSTERONE INTO THE MUSCLE AS DIRECTED EVERY 14 DAYS 6 each 0   escitalopram (LEXAPRO) 10 MG tablet TAKE 1 TABLET BY MOUTH IN THE MORNING 90 tablet 0   furosemide (LASIX) 20 MG tablet Take 1 tablet (20 mg total) by mouth daily. 30 tablet 0   ketoconazole (NIZORAL) 2 % cream APPLY AS NEEDED TO RASH (Patient taking differently: Apply to groin or legs daily as needed for rash) 60 g 1   levothyroxine (SYNTHROID) 25 MCG tablet TAKE 1 TABLET BY MOUTH ONCE DAILY BEFORE BREAKFAST 90 tablet 0   lisinopril-hydrochlorothiazide (ZESTORETIC) 20-12.5 MG tablet Take 1 tablet by mouth daily. 30 tablet 11   Melatonin 3 MG TABS Take 3 mg by mouth at bedtime as needed (for sleep).     Multiple Vitamin (MULTIVITAMIN WITH MINERALS) TABS tablet Take 1 tablet by mouth daily with breakfast.  SAFETY-LOK 3CC SYR 22GX1.5" 22G X 1-1/2" 3 ML MISC USE TO INJECT 1 ML OF TESTOSTERONE EVERY 14 DAYS 6 each 0   testosterone enanthate (DELATESTRYL) 200 MG/ML injection INJECT 1 ML INTRAMUSCULARLY  EVERY TWO WEEKS 10 mL 1   traMADol (ULTRAM) 50 MG tablet TAKE 1 TABLET BY MOUTH EVERY 6 HOURS AS NEEDED 60 tablet 0   traZODone (DESYREL) 100 MG tablet TAKE 1 TABLET BY MOUTH AT BEDTIME 90 tablet 0   triamcinolone cream (KENALOG) 0.1 % Apply 1 application topically 2 (two) times daily as needed (for rash). Applies to face     cetirizine (ZYRTEC) 10 MG tablet Take 10  mg by mouth daily with breakfast.     No current facility-administered medications for this encounter.    REVIEW OF SYSTEMS:  Notable for that above.   PHYSICAL EXAM:  vitals were not taken for this visit.      LABORATORY DATA:  Lab Results  Component Value Date   WBC 11.0 (H) 09/16/2019   HGB 13.1 09/16/2019   HCT 39.8 09/16/2019   MCV 90.2 09/16/2019   PLT 187 09/16/2019   CMP     Component Value Date/Time   NA 137 09/16/2019 1453   NA 138 03/13/2017 0922   K 5.1 09/16/2019 1453   K 4.6 03/13/2017 0922   CL 106 09/16/2019 1453   CO2 22 09/16/2019 1453   CO2 24 03/13/2017 0922   GLUCOSE 142 (H) 09/16/2019 1453   GLUCOSE 91 03/13/2017 0922   BUN 31 (H) 09/16/2019 1453   BUN 27.4 (H) 03/13/2017 0922   CREATININE 1.20 09/16/2019 1453   CREATININE 0.87 06/05/2017 1643   CREATININE 1.3 03/13/2017 0922   CALCIUM 8.8 (L) 09/16/2019 1453   CALCIUM 9.3 03/13/2017 0922   PROT 7.5 09/16/2019 1453   PROT 7.9 03/13/2017 0922   ALBUMIN 3.2 (L) 09/16/2019 1453   ALBUMIN 3.0 (L) 03/13/2017 0922   AST 13 (L) 09/16/2019 1453   AST 17 03/13/2017 0922   ALT 27 09/16/2019 1453   ALT 21 03/13/2017 0922   ALKPHOS 67 09/16/2019 1453   ALKPHOS 91 03/13/2017 0922   BILITOT 0.3 09/16/2019 1453   BILITOT 0.27 03/13/2017 0922   GFRNONAA >60 09/16/2019 1453   GFRAA >60 09/16/2019 1453         RADIOGRAPHY: MR HUMERUS LEFT W WO CONTRAST  Result Date: 10/07/2019 CLINICAL DATA:  History of renal cell carcinoma with new palpable mass in the upper left arm. EXAM: MRI OF THE LEFT HUMERUS WITHOUT AND WITH CONTRAST TECHNIQUE: Multiplanar, multisequence MR imaging of the left upper extremity was performed before and after the administration of intravenous contrast. CONTRAST:  39m GADAVIST GADOBUTROL 1 MMOL/ML IV SOLN COMPARISON:  None. FINDINGS: There is a large rounded soft tissue mass measuring 3.7 x 3.6 cm in the anterior aspect of the left upper arm. This has intermediate T1 signal intensity,  increased T2 signal intensity and moderate contrast enhancement. Findings highly suspicious for a metastatic focus given his history of metastatic renal cell carcinoma. The mass begins at the level of the deltoid attachment on the humerus and is anterior to the upper aspect of the biceps muscle. No involvement of the humerus is identified. No direct invasion of the biceps or deltoid muscles. No axillary lymphadenopathy is identified. No other subcutaneous lesions are identified involving the left upper extremity or left chest wall. IMPRESSION: 3.7 x 3.6 cm enhancing soft tissue mass in the anterior aspect of the left upper arm. This  is highly suspicious for a metastatic focus given history of metastatic renal cell carcinoma. Electronically Signed   By: Marijo Sanes M.D.   On: 10/07/2019 13:19      IMPRESSION/PLAN: Metastatic Renal Cell Carcinoma  The patient and his wife state that their goals of care are for palliation of his pain.  Given his comorbidities they do not want to pursue aggressive systemic therapies that will decrease his quality of life.  Today, I talked to the patient and his wife about the findings and work-up thus far. We discussed the patient's arm mass, which is presumably due to a metastatic renal cell carcinoma to the left upper limb, and general treatment for this, highlighting the role of radiotherapy in the management. We discussed the available radiation techniques, and focused on the details of logistics and delivery.    We discussed the risks, benefits, and side effects of radiotherapy. Side effects may include but not necessarily be limited to: Fatigue, skin irritation, tenderness in the arm, rare bone or muscle injury, ulceration of the skin which might require a wound clinic referral; no guarantees of treatment were given.  The patient was encouraged to ask questions that I answered to the best of my ability.   We will schedule him for a CT simulation next week at a  time that is convenient for him.  They are enthusiastic about radiation therapy.  After I examine him we can determine if a 5 or 10 fraction regimen is most appropriate.  Tentatively, plan to deliver 40 Gray in 10 daily fractions to the mass.  This encounter was provided by telemedicine platform by telephone as patient was unable to access MyChart video during pandemic precautions The patient has given verbal consent for this type of encounter and has been advised to only accept a meeting of this type in a secure network environment. On date of service, in total, I spent 50 minutes on this encounter. The attendants for this meeting include Eppie Gibson  and Talbert Cage.  His wife was present too. During the encounter, Eppie Gibson was located at [facility] Radiation Oncology Department.  Talbert Cage was located at home.      __________________________________________   Eppie Gibson, MD  This document serves as a record of services personally performed by Eppie Gibson, MD. It was created on her behalf by Wilburn Mylar, a trained medical scribe. The creation of this record is based on the scribe's personal observations and the provider's statements to them. This document has been checked and approved by the attending provider.

## 2019-10-13 NOTE — Progress Notes (Signed)
Histology and Location of Primary Cancer:  Stage IV renal cell carcinoma with peritoneal and lung involvement diagnosed in 2015.  Location of Symptomatic tumor:  There is a large rounded soft tissue mass measuring 3.7 x 3.6 cm in the anterior aspect of the left upper arm. The mass begins at the level of the deltoid attachment on the humerus and is anterior to the upper aspect of the biceps muscle.   Past/Anticipated chemotherapy by medical oncology, if any:  09/16/19 Dr. Alen Blew  Prior Therapy:  1. He is status post robotic-assisted laparoscopic nephrectomy on the left done on 04/23/2012. The pathology revealed renal cell carcinoma with a grade 3/4 with the pathological staging of T3a.  2. Votrient 800 mg daily started around 12/27/2013. This was reduced to 400 mg subsequently and have been discontinued since 02/03/2014 due to poor tolerance. Current therapy: He declined any additional therapy currently on active surveillance.  Impression and Plan: 64 year old man with:  1.  Stage IV renal cell carcinoma with peritoneal and lung involvement since 2015. He continues to be on active surveillance without any systemic treatment.  He had a trial of anticancer therapy in 2015 and opted against it.  The natural course of this disease as well as indication for systemic treatments were reviewed.  Alternatively continued active surveillance was also discussed.  Options of therapy will include oral targeted therapy, intravenous immune therapy, as combination treatment.  His performance status is marginal and quality of life is limited and I doubt there will be significant improvement in his quality of life with cancer treatment.  The time being he is okay with continued active surveillance but willing to have another staging scan in 4 months. 2. Prognosis: His disease is incurable although he has limited quality of life and have opted against anticancer treatment.  This disease has progressed over the  years rather slowly but eventually will lead to further morbidities and decline. 3.  Nodule in the left bicep muscle: I recommended obtaining an MRI and consideration for possible palliative radiation therapy if needed. 4. Followup: Will be in 4 months after repeat imaging studies.   Patient's main complaints related to symptomatic tumor are: He has pain at times to his left arm and describes it as "itchy". It has been present for several years but keeps growing larger and larger.   Pain on a scale of 0-10 is: He does have pain to his left arm most of the time, but it sometimes worse than other times.    If Spine Met(s), symptoms, if any, include: N/A  Bowel/Bladder retention or incontinence (please describe):   Numbness or weakness in extremities (please describe):   Current Decadron regimen, if applicable:   Ambulatory status? Walker? Wheelchair?: He has limited mobility and uses a wheelchair.   SAFETY ISSUES:  Prior radiation? Gamma Knife radiation in 2011 for a pituitary tumor.   Pacemaker/ICD? No  Possible current pregnancy? No  Is the patient on methotrexate? No  Additional Complaints / other details:

## 2019-10-14 ENCOUNTER — Ambulatory Visit: Payer: Self-pay

## 2019-10-14 ENCOUNTER — Other Ambulatory Visit: Payer: Self-pay

## 2019-10-14 ENCOUNTER — Ambulatory Visit
Admission: RE | Admit: 2019-10-14 | Discharge: 2019-10-14 | Disposition: A | Discharge status: Home / Self Care | Payer: PPO | Source: Ambulatory Visit | Attending: Radiation Oncology | Admitting: Radiation Oncology

## 2019-10-14 ENCOUNTER — Encounter: Payer: Self-pay | Admitting: Radiation Oncology

## 2019-10-14 DIAGNOSIS — C7642 Malignant neoplasm of left upper limb: Secondary | ICD-10-CM | POA: Insufficient documentation

## 2019-10-14 DIAGNOSIS — Z85528 Personal history of other malignant neoplasm of kidney: Secondary | ICD-10-CM | POA: Diagnosis not present

## 2019-10-14 DIAGNOSIS — C649 Malignant neoplasm of unspecified kidney, except renal pelvis: Secondary | ICD-10-CM

## 2019-10-14 DIAGNOSIS — C7989 Secondary malignant neoplasm of other specified sites: Secondary | ICD-10-CM | POA: Diagnosis not present

## 2019-10-17 ENCOUNTER — Inpatient Hospital Stay (HOSPITAL_COMMUNITY)
Admission: EM | Admit: 2019-10-17 | Discharge: 2019-10-20 | DRG: 871 | Disposition: A | Discharge status: Home / Self Care | Payer: PPO | Attending: Internal Medicine | Admitting: Internal Medicine

## 2019-10-17 ENCOUNTER — Emergency Department (HOSPITAL_COMMUNITY): Payer: PPO

## 2019-10-17 ENCOUNTER — Other Ambulatory Visit: Payer: Self-pay

## 2019-10-17 ENCOUNTER — Encounter (HOSPITAL_COMMUNITY): Payer: Self-pay | Admitting: Emergency Medicine

## 2019-10-17 DIAGNOSIS — G8250 Quadriplegia, unspecified: Secondary | ICD-10-CM | POA: Diagnosis present

## 2019-10-17 DIAGNOSIS — R1084 Generalized abdominal pain: Secondary | ICD-10-CM | POA: Diagnosis not present

## 2019-10-17 DIAGNOSIS — I129 Hypertensive chronic kidney disease with stage 1 through stage 4 chronic kidney disease, or unspecified chronic kidney disease: Secondary | ICD-10-CM | POA: Diagnosis not present

## 2019-10-17 DIAGNOSIS — C779 Secondary and unspecified malignant neoplasm of lymph node, unspecified: Secondary | ICD-10-CM | POA: Diagnosis present

## 2019-10-17 DIAGNOSIS — I10 Essential (primary) hypertension: Secondary | ICD-10-CM | POA: Diagnosis present

## 2019-10-17 DIAGNOSIS — C7642 Malignant neoplasm of left upper limb: Secondary | ICD-10-CM | POA: Diagnosis not present

## 2019-10-17 DIAGNOSIS — D352 Benign neoplasm of pituitary gland: Secondary | ICD-10-CM | POA: Diagnosis not present

## 2019-10-17 DIAGNOSIS — Z808 Family history of malignant neoplasm of other organs or systems: Secondary | ICD-10-CM

## 2019-10-17 DIAGNOSIS — E119 Type 2 diabetes mellitus without complications: Secondary | ICD-10-CM

## 2019-10-17 DIAGNOSIS — C494 Malignant neoplasm of connective and soft tissue of abdomen: Secondary | ICD-10-CM | POA: Diagnosis not present

## 2019-10-17 DIAGNOSIS — G4700 Insomnia, unspecified: Secondary | ICD-10-CM | POA: Diagnosis not present

## 2019-10-17 DIAGNOSIS — E039 Hypothyroidism, unspecified: Secondary | ICD-10-CM | POA: Diagnosis not present

## 2019-10-17 DIAGNOSIS — E785 Hyperlipidemia, unspecified: Secondary | ICD-10-CM | POA: Diagnosis not present

## 2019-10-17 DIAGNOSIS — E860 Dehydration: Secondary | ICD-10-CM | POA: Diagnosis not present

## 2019-10-17 DIAGNOSIS — Z20822 Contact with and (suspected) exposure to covid-19: Secondary | ICD-10-CM | POA: Diagnosis present

## 2019-10-17 DIAGNOSIS — N319 Neuromuscular dysfunction of bladder, unspecified: Secondary | ICD-10-CM | POA: Diagnosis present

## 2019-10-17 DIAGNOSIS — E1122 Type 2 diabetes mellitus with diabetic chronic kidney disease: Secondary | ICD-10-CM | POA: Diagnosis not present

## 2019-10-17 DIAGNOSIS — N1831 Chronic kidney disease, stage 3a: Secondary | ICD-10-CM | POA: Diagnosis not present

## 2019-10-17 DIAGNOSIS — Z87440 Personal history of urinary (tract) infections: Secondary | ICD-10-CM

## 2019-10-17 DIAGNOSIS — Z79891 Long term (current) use of opiate analgesic: Secondary | ICD-10-CM

## 2019-10-17 DIAGNOSIS — Z811 Family history of alcohol abuse and dependence: Secondary | ICD-10-CM | POA: Diagnosis not present

## 2019-10-17 DIAGNOSIS — Z87891 Personal history of nicotine dependence: Secondary | ICD-10-CM | POA: Diagnosis not present

## 2019-10-17 DIAGNOSIS — A419 Sepsis, unspecified organism: Principal | ICD-10-CM | POA: Diagnosis present

## 2019-10-17 DIAGNOSIS — Z8261 Family history of arthritis: Secondary | ICD-10-CM

## 2019-10-17 DIAGNOSIS — Z933 Colostomy status: Secondary | ICD-10-CM | POA: Diagnosis not present

## 2019-10-17 DIAGNOSIS — Z823 Family history of stroke: Secondary | ICD-10-CM

## 2019-10-17 DIAGNOSIS — C649 Malignant neoplasm of unspecified kidney, except renal pelvis: Secondary | ICD-10-CM | POA: Diagnosis not present

## 2019-10-17 DIAGNOSIS — Z800 Family history of malignant neoplasm of digestive organs: Secondary | ICD-10-CM

## 2019-10-17 DIAGNOSIS — Z85528 Personal history of other malignant neoplasm of kidney: Secondary | ICD-10-CM

## 2019-10-17 DIAGNOSIS — Z905 Acquired absence of kidney: Secondary | ICD-10-CM | POA: Diagnosis not present

## 2019-10-17 DIAGNOSIS — N179 Acute kidney failure, unspecified: Secondary | ICD-10-CM | POA: Diagnosis present

## 2019-10-17 DIAGNOSIS — J189 Pneumonia, unspecified organism: Secondary | ICD-10-CM | POA: Diagnosis present

## 2019-10-17 DIAGNOSIS — R0602 Shortness of breath: Secondary | ICD-10-CM | POA: Diagnosis not present

## 2019-10-17 DIAGNOSIS — Z79890 Hormone replacement therapy (postmenopausal): Secondary | ICD-10-CM

## 2019-10-17 DIAGNOSIS — Z8049 Family history of malignant neoplasm of other genital organs: Secondary | ICD-10-CM

## 2019-10-17 DIAGNOSIS — E875 Hyperkalemia: Secondary | ICD-10-CM | POA: Diagnosis not present

## 2019-10-17 DIAGNOSIS — Z993 Dependence on wheelchair: Secondary | ICD-10-CM

## 2019-10-17 DIAGNOSIS — Z79899 Other long term (current) drug therapy: Secondary | ICD-10-CM

## 2019-10-17 DIAGNOSIS — F329 Major depressive disorder, single episode, unspecified: Secondary | ICD-10-CM | POA: Diagnosis present

## 2019-10-17 DIAGNOSIS — R000 Tachycardia, unspecified: Secondary | ICD-10-CM | POA: Diagnosis not present

## 2019-10-17 DIAGNOSIS — I69365 Other paralytic syndrome following cerebral infarction, bilateral: Secondary | ICD-10-CM

## 2019-10-17 DIAGNOSIS — Z8349 Family history of other endocrine, nutritional and metabolic diseases: Secondary | ICD-10-CM

## 2019-10-17 LAB — CBC WITH DIFFERENTIAL/PLATELET
Abs Immature Granulocytes: 0.03 10*3/uL (ref 0.00–0.07)
Basophils Absolute: 0 10*3/uL (ref 0.0–0.1)
Basophils Relative: 0 %
Eosinophils Absolute: 0 10*3/uL (ref 0.0–0.5)
Eosinophils Relative: 0 %
HCT: 41.3 % (ref 39.0–52.0)
Hemoglobin: 13.2 g/dL (ref 13.0–17.0)
Immature Granulocytes: 0 %
Lymphocytes Relative: 3 %
Lymphs Abs: 0.4 10*3/uL — ABNORMAL LOW (ref 0.7–4.0)
MCH: 29.5 pg (ref 26.0–34.0)
MCHC: 32 g/dL (ref 30.0–36.0)
MCV: 92.4 fL (ref 80.0–100.0)
Monocytes Absolute: 0.7 10*3/uL (ref 0.1–1.0)
Monocytes Relative: 6 %
Neutro Abs: 9.5 10*3/uL — ABNORMAL HIGH (ref 1.7–7.7)
Neutrophils Relative %: 91 %
Platelets: 263 10*3/uL (ref 150–400)
RBC: 4.47 MIL/uL (ref 4.22–5.81)
RDW: 16.3 % — ABNORMAL HIGH (ref 11.5–15.5)
WBC: 10.6 10*3/uL — ABNORMAL HIGH (ref 4.0–10.5)
nRBC: 0 % (ref 0.0–0.2)

## 2019-10-17 LAB — URINALYSIS, ROUTINE W REFLEX MICROSCOPIC
Bilirubin Urine: NEGATIVE
Glucose, UA: NEGATIVE mg/dL
Ketones, ur: NEGATIVE mg/dL
Nitrite: NEGATIVE
Protein, ur: NEGATIVE mg/dL
Specific Gravity, Urine: 1.016 (ref 1.005–1.030)
pH: 5 (ref 5.0–8.0)

## 2019-10-17 LAB — COMPREHENSIVE METABOLIC PANEL
ALT: 21 U/L (ref 0–44)
AST: 14 U/L — ABNORMAL LOW (ref 15–41)
Albumin: 3.5 g/dL (ref 3.5–5.0)
Alkaline Phosphatase: 53 U/L (ref 38–126)
Anion gap: 13 (ref 5–15)
BUN: 67 mg/dL — ABNORMAL HIGH (ref 8–23)
CO2: 15 mmol/L — ABNORMAL LOW (ref 22–32)
Calcium: 8.5 mg/dL — ABNORMAL LOW (ref 8.9–10.3)
Chloride: 109 mmol/L (ref 98–111)
Creatinine, Ser: 2.19 mg/dL — ABNORMAL HIGH (ref 0.61–1.24)
GFR calc Af Amer: 35 mL/min — ABNORMAL LOW (ref >60)
GFR calc non Af Amer: 30 mL/min — ABNORMAL LOW (ref >60)
Glucose, Bld: 118 mg/dL — ABNORMAL HIGH (ref 70–99)
Potassium: 5.4 mmol/L — ABNORMAL HIGH (ref 3.5–5.1)
Sodium: 137 mmol/L (ref 135–145)
Total Bilirubin: 0.6 mg/dL (ref 0.3–1.2)
Total Protein: 7.4 g/dL (ref 6.5–8.1)

## 2019-10-17 LAB — LACTIC ACID, PLASMA
Lactic Acid, Venous: 1.4 mmol/L (ref 0.5–1.9)
Lactic Acid, Venous: 1.1 mmol/L (ref 0.5–1.9)

## 2019-10-17 LAB — PROTIME-INR
INR: 1.1 (ref 0.8–1.2)
Prothrombin Time: 13.9 seconds (ref 11.4–15.2)

## 2019-10-17 MED ORDER — HYDROCOD POLST-CPM POLST ER 10-8 MG/5ML PO SUER: 5.0000 mL | Freq: Two times a day (BID) | ORAL | Status: DC | PRN

## 2019-10-17 MED ORDER — SODIUM CHLORIDE 0.9 % IV BOLUS
1000.0000 mL | Freq: Once | INTRAVENOUS | Status: AC
  Administered 2019-10-17: 1000 mL via INTRAVENOUS

## 2019-10-17 MED ORDER — GUAIFENESIN-DM 100-10 MG/5ML PO SYRP: 10.0000 mL | ORAL_SOLUTION | ORAL | Status: DC | PRN

## 2019-10-17 MED ORDER — SODIUM CHLORIDE 0.9 % IV SOLN
2.0000 g | Freq: Every day | INTRAVENOUS | Status: DC
  Administered 2019-10-18 – 2019-10-19 (×3): 2 g via INTRAVENOUS
  Filled 2019-10-17: qty 2
  Filled 2019-10-17: qty 20
  Filled 2019-10-17: qty 2
  Filled 2019-10-17: qty 20

## 2019-10-17 MED ORDER — SODIUM CHLORIDE 0.9 % IV SOLN
500.0000 mg | Freq: Every day | INTRAVENOUS | Status: DC
  Administered 2019-10-18 – 2019-10-19 (×3): 500 mg via INTRAVENOUS
  Filled 2019-10-17 (×4): qty 500

## 2019-10-17 NOTE — H&P (Signed)
Austin [name]:[id number] DOB: [birth year] DOA: 10/17/2019     PCP: [name], MD   Outpatient Specialists:   rad Isidore Moos  Oncology  Dr. Alen Blew   Patient arrived to ER on 10/17/19 at [place]  Patient coming from: home Lives  With family    Chief Complaint:   Chief Complaint  Patient presents with  . Abdominal Pain    HPI: Austin [name] is a 64 y.o. male with medical history significant of  past medical history of renal cell carcinoma treated with nephrectomy now with metastasis, prior brainstem cva with quadriplegia  at 64 years old; diverticular perforation with colostomy, CKD, HTN, pituitary macroadenoma sp ressection, enlarging left arm mass.     Presented with abdominal cramping followed by increased ostomy outputs no blood or melena.  No fevers or chills he felt a bit more short of breath family reports decreased p.o. intake.  Nobody else has been sick in the house he stays at home with his wife and granddaughter who does not go to school.  Their children visit them at times but everybody been healthy. He has not been gotten his vaccination yet scheduled to get Covid vaccine on this upcoming Saturday    Infectious risk factors:  Reports  fever, shortness of breath,    in house  PCR testing  Pending  No results found for: SARSCOV2NAA   Regarding pertinent Chronic problems:       HTN on Norvasc, lisinopril-HCTZ    Renal CA - status post robotic-assisted laparoscopic nephrectomy on the left done on 04/23/2012. Now with mets to lungs and peritoneum  He declined any additional therapy currently on active surveillance.     Hypothyroidism:  Lab Results  Component Value Date   TSH 3.10 07/22/2019   on synthroid     CKD stage III - baseline Cr 1.2 Lab Results  Component Value Date   CREATININE 2.19 (H) 10/17/2019   CREATININE 1.20 09/16/2019   CREATININE 1.25 07/22/2019    BPH - on Flomax      While in ER: CT scan worrisome for basal consolidation  probably infectious and metastatic spread  febrile in emergency department     The following Work up has been ordered so far:  Orders Placed This Encounter  Procedures  . Culture, blood (Routine x 2)  . SARS CORONAVIRUS 2 (TAT 6-24 HRS) Nasopharyngeal Nasopharyngeal Swab  . DG Chest 2 View  . CT ABDOMEN PELVIS WO CONTRAST  . Comprehensive metabolic panel  . Lactic acid, plasma  . CBC with Differential  . Protime-INR  . Urinalysis, Routine w reflex microscopic  . Notify Physician if pt is possible Sepsis patient  . Document height and weight  . Insert / maintain saline lock  . In and Out Cath  . Consult to hospitalist  ALL PATIENTS BEING ADMITTED/HAVING PROCEDURES NEED COVID-19 SCREENING  . Consult to hospitalist  ALL PATIENTS BEING ADMITTED/HAVING PROCEDURES NEED COVID-19 SCREENING     Following Medications were ordered in ER: Medications  sodium chloride 0.9 % bolus 1,000 mL (1,000 mLs Intravenous New Bag/Given 10/17/19 2244)        Consult Orders  (From admission, onward)         Start     Ordered   10/17/19 2322  Consult to hospitalist  ALL PATIENTS BEING ADMITTED/HAVING PROCEDURES NEED COVID-19 SCREENING  Once    Comments: ALL PATIENTS BEING ADMITTED/HAVING PROCEDURES NEED COVID-19 SCREENING  Provider:  (Not yet assigned)  Question  Answer Comment  Place call to: [facility]   Reason for Consult Admit      10/17/19 2321   10/17/19 2251  Consult to hospitalist  ALL PATIENTS BEING ADMITTED/HAVING PROCEDURES NEED COVID-19 SCREENING  Once    Comments: ALL PATIENTS BEING ADMITTED/HAVING PROCEDURES NEED COVID-19 SCREENING  Provider:  (Not yet assigned)  Question Answer Comment  Place call to: [facility]   Reason for Consult Admit      10/17/19 2250          Significant initial  Findings: Abnormal Labs Reviewed  COMPREHENSIVE METABOLIC PANEL - Abnormal; Notable for the following components:      Result Value   Potassium 5.4 (*)    CO2 15 (*)     Glucose, Bld 118 (*)    BUN 67 (*)    Creatinine, Ser 2.19 (*)    Calcium 8.5 (*)    AST 14 (*)    GFR calc non Af Amer 30 (*)    GFR calc Af Amer 35 (*)    All other components within normal limits  CBC WITH DIFFERENTIAL/PLATELET - Abnormal; Notable for the following components:   WBC 10.6 (*)    RDW 16.3 (*)    Neutro Abs 9.5 (*)    Lymphs Abs 0.4 (*)    All other components within normal limits  URINALYSIS, ROUTINE W REFLEX MICROSCOPIC - Abnormal; Notable for the following components:   Hgb urine dipstick SMALL (*)    Leukocytes,Ua TRACE (*)    Bacteria, UA RARE (*)    All other components within normal limits    Otherwise labs showing:    Recent Labs  Lab 10/17/19 2000  NA 137  K 5.4*  CO2 15*  GLUCOSE 118*  BUN 67*  CREATININE 2.19*  CALCIUM 8.5*    Cr   Up from baseline see below Lab Results  Component Value Date   CREATININE 2.19 (H) 10/17/2019   CREATININE 1.20 09/16/2019   CREATININE 1.25 07/22/2019    Recent Labs  Lab 10/17/19 2000  AST 14*  ALT 21  ALKPHOS 53  BILITOT 0.6  PROT 7.4  ALBUMIN 3.5   Lab Results  Component Value Date   CALCIUM 8.5 (L) 10/17/2019      WBC       Component Value Date/Time   WBC 10.6 (H) 10/17/2019 2000   ANC    Component Value Date/Time   NEUTROABS 9.5 (H) 10/17/2019 2000   NEUTROABS 9.1 (H) 07/14/2017 0859   ALC No components found for: LYMPHAB    Plt: Lab Results  Component Value Date   PLT 263 10/17/2019     Lactic Acid, Venous    Component Value Date/Time   LATICACIDVEN 1.1 10/17/2019 2214    Procalcitonin 8   COVID-19 Labs  No results for input(s): DDIMER, FERRITIN, LDH, CRP in the last 72 hours.  No results found for: SARSCOV2NAA    HG/HCT   stable,       Component Value Date/Time   HGB 13.2 10/17/2019 2000   HGB 13.1 09/16/2019 1453   HGB 8.6 (L) 07/14/2017 0859   HCT 41.3 10/17/2019 2000   HCT 29.7 (L) 07/14/2017 0859        ECG: Ordered Personally reviewed by me  showing: HR : 104 Rhythm: Sinus tachycardia   no evidence of ischemic changes QTC 450    UA  no evidence of UTI     Urine analysis:    Component Value Date/Time  COLORURINE YELLOW 10/17/2019 2028   APPEARANCEUR CLEAR 10/17/2019 2028   LABSPEC 1.016 10/17/2019 2028   PHURINE 5.0 10/17/2019 2028   GLUCOSEU NEGATIVE 10/17/2019 2028   HGBUR SMALL (A) 10/17/2019 2028   HGBUR negative 02/01/2010 1022   BILIRUBINUR NEGATIVE 10/17/2019 2028   BILIRUBINUR Negative 10/13/2018 1014   KETONESUR NEGATIVE 10/17/2019 2028   PROTEINUR NEGATIVE 10/17/2019 2028   UROBILINOGEN 0.2 10/13/2018 1014   UROBILINOGEN 0.2 02/01/2010 1022   NITRITE NEGATIVE 10/17/2019 2028   LEUKOCYTESUR TRACE (A) 10/17/2019 2028     Ordered    CXR -   NON acute  CTabd/pelvis -  Bibasilar infiltrates  metastatic disease    ED Triage Vitals  Enc Vitals Group     BP 10/17/19 1856 (!) 149/64     Pulse Rate 10/17/19 1856 (!) 119     Resp 10/17/19 1856 19     Temp 10/17/19 1856 97.9 F (36.6 C)     Temp Source 10/17/19 1856 Oral     SpO2 10/17/19 1856 91 %     Weight 10/17/19 1854 180 lb (81.6 kg)     Height 10/17/19 1854 6' (1.829 m)     Head Circumference --      Peak Flow --      Pain Score --      Pain Loc --      Pain Edu? --      Excl. in [place]? --   TMAX(24)@       Latest  Blood pressure 131/73, pulse (!) 106, temperature (!) 101.4 F (38.6 C), temperature source Rectal, resp. rate 19, height 6' (1.829 m), weight 81.6 kg, SpO2 95 %.    Hospitalist was called for admission for CAP and dehydration   Review of Systems:    Pertinent positives include: chills, fatigue shortness of breath at rest, non-productive cough,abdominal pain nausea, vomiting, diarrhea, Constitutional:  No weight loss, night sweats, Fevers, , weight loss  HEENT:  No headaches, Difficulty swallowing,Tooth/dental problems,Sore throat,  No sneezing, itching, ear ache, nasal congestion, post nasal drip,  Cardio-vascular:  No  chest pain, Orthopnea, PND, anasarca, dizziness, palpitations.no Bilateral lower extremity swelling  GI:  No heartburn, indigestion, ,  change in bowel habits, loss of appetite, melena, blood in stool, hematemesis Resp:  no . No dyspnea on exertion, No excess mucus, no productive cough, No  No coughing up of blood.No change in color of mucus.No wheezing. Skin:  no rash or lesions. No jaundice GU:  no dysuria, change in color of urine, no urgency or frequency. No straining to urinate.  No flank pain.  Musculoskeletal:  No joint pain or no joint swelling. No decreased range of motion. No back pain.  Psych:  No change in mood or affect. No depression or anxiety. No memory loss.  Neuro: no localizing neurological complaints, no tingling, no weakness, no double vision, no gait abnormality, no slurred speech, no confusion  All systems reviewed and apart from [place] all are negative  Past Medical History:   Past Medical History:  Diagnosis Date  . Brainstem stroke ([place]) 10/16/2008   Qualifier: Diagnosis of  By: [name] LPN, [name]    . C. difficile colitis 02/08/2017  . Cerebrovascular accident ([place]) 1994   hx of brainstem stroke, residual diminished lung capacity  . Chronic kidney disease    Left Renal Mass  . 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 Diverticulitis   . Duodenal ulcer   . GIB (gastrointestinal bleeding) 05/28/2017  . Hx of  colonic polyps   . Hypertension    has been off BP meds since GI bleed was thought to have been caused by previously prescribed lisinopril   . Hypogonadism   . Intra-abdominal abscess ([place]) 01/23/2017  . Iron deficiency anemia   . 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 Open abdominal incision with drainage 01/26/2017  . Pituitary adenoma ([place])   . Pituitary macroadenoma ([place])    progression into right cavernous sinus  . Pneumonia   . PONV (postoperative nausea and  vomiting)    vomited after colostomy placement   . Pre-diabetes    last a1c 5.6   . 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[place])   . Urinary incontinence   . Venous stasis    edema     Past Surgical History:  Procedure Laterality Date  . CHOLECYSTECTOMY    . COLON RESECTION N/A 05/04/2017   Procedure: LAPAROSCOPIC SIGMOID COLON RESECTION WITH END COLSOTOMY ERAS PATHWAY;  Surgeon: Alphonsa Overall, MD;  Location: [facility];  Service: General;  Laterality: N/A;  . COLONOSCOPY  02/27/2012   Procedure: COLONOSCOPY;  Surgeon: Jerene Bears, MD;  Location: [facility];  Service: Gastroenterology;  Laterality: N/A;  . COLONOSCOPY N/A 05/31/2017   Procedure: COLONOSCOPY;  Surgeon: Milus Banister, MD;  Location: [facility];  Service: Endoscopy;  Laterality: N/A;  . COLOSTOMY TAKEDOWN N/A 08/20/2017   Procedure: LAPAROSCOPIC ASSISTED REVISION END COLOSTOMY AND ENTEROLYSIS OF ADHESIONS;  Surgeon: Alphonsa Overall, MD;  Location: [facility];  Service: General;  Laterality: N/A;  ERAS PATHWAY  . ESOPHAGOGASTRODUODENOSCOPY N/A 05/31/2017   Procedure: ESOPHAGOGASTRODUODENOSCOPY (EGD);  Surgeon: Milus Banister, MD;  Location: [facility];  Service: Endoscopy;  Laterality: N/A;  . gamma knife radiation surgery  05/2010   for pituitary  . IR RADIOLOGIST EVAL & MGMT  02/12/2017  . IR RADIOLOGIST EVAL & MGMT  03/17/2017  . IR RADIOLOGIST EVAL & MGMT  04/14/2017  . IR RADIOLOGIST EVAL & MGMT  02/05/2017  . PITUITARY SURGERY  2007   nose approach  . ROBOT ASSISTED LAPAROSCOPIC NEPHRECTOMY  04/23/2012   Procedure: ROBOTIC ASSISTED LAPAROSCOPIC NEPHRECTOMY;  Surgeon: Alexis Frock, MD;  Location: [facility];  Service: Urology;  Laterality: Left;  radical  . stomach peg  02/1993   removed 5-6 years later  . TRACHEOSTOMY TUBE PLACEMENT  02/1993   removed 04/1993  . UMBILICAL HERNIA REPAIR  04/23/2012   Procedure: HERNIA REPAIR UMBILICAL ADULT;  Surgeon: Alexis Frock, MD;  Location: [facility];  Service: Urology;;  . [name]  . vocal cord  surgery     injected with collagen, then fat from stomach to improve speech s/p stroke    Social History:  Ambulatory    wheelchair bound,      reports that he quit smoking about 27 years ago. His smoking use included cigarettes. He has a 11.50 pack-year smoking history. He has never used smokeless tobacco. He reports that he does not drink alcohol or use drugs.     Family History:   Family History  Problem Relation Age of Onset  . Alcohol abuse Father   . Throat cancer Father   . Esophageal cancer Father 67  . Arthritis Mother   . Hyperlipidemia Mother   . Uterine cancer Mother 51  . Colon cancer Brother   . Arthritis Maternal Grandmother   . Hypertension Brother   . Stroke Paternal Grandmother  Allergies: Allergies  Allergen Reactions  . Penicillins Rash and Other (See Comments)    Has patient had a PCN reaction causing immediate rash, facial/tongue/throat swelling, SOB or lightheadedness with hypotension: YES Has patient had a PCN reaction causing severe rash involving mucus membranes or skin necrosis: No Has patient had a PCN reaction that required hospitalization No Has patient had a PCN reaction occurring within the last 10 years: Yes  If all of the above answers are "NO", then may proceed with Cephalosporin use.     Prior to Admission medications   Medication Sig Start Date End Date Taking? Authorizing Provider  acetaminophen (TYLENOL) 500 MG tablet Take 1 tablet (500 mg total) by mouth every 6 (six) hours as needed for mild pain, moderate pain, fever or headache. 06/02/17  Yes Florencia Reasons, MD  amLODipine (NORVASC) 10 MG tablet Take 1 tablet (10 mg total) by mouth daily. 09/30/19  Yes [name], [name], MD  escitalopram (LEXAPRO) 10 MG tablet TAKE 1 TABLET BY MOUTH IN THE MORNING 09/20/19  Yes [name], [name], MD  furosemide (LASIX) 20 MG tablet Take 1 tablet (20 mg total) by mouth daily. Patient taking differently: Take 20 mg by mouth daily as needed for fluid  or edema.  06/05/17  Yes [name], [name], MD  ketoconazole (NIZORAL) 2 % cream APPLY AS NEEDED TO RASH Patient taking differently: Apply 1 application topically daily as needed for irritation (rash).  10/23/16  Yes [name], [name], MD  levothyroxine (SYNTHROID) 25 MCG tablet TAKE 1 TABLET BY MOUTH ONCE DAILY BEFORE BREAKFAST Patient taking differently: Take 25 mcg by mouth daily before breakfast.  10/10/19  Yes [name], [name], MD  lisinopril-hydrochlorothiazide (ZESTORETIC) 20-12.5 MG tablet Take 1 tablet by mouth daily. 06/28/19  Yes [name], [name], MD  Melatonin 3 MG TABS Take 3 mg by mouth at bedtime as needed (for sleep).   Yes [provider]  Multiple Vitamin (MULTIVITAMIN WITH MINERALS) TABS tablet Take 1 tablet by mouth daily with breakfast.    Yes [provider]  testosterone enanthate (DELATESTRYL) 200 MG/ML injection INJECT 1 ML INTRAMUSCULARLY  EVERY TWO WEEKS 07/22/19  Yes [name], [name], MD  traMADol (ULTRAM) 50 MG tablet TAKE 1 TABLET BY MOUTH EVERY 6 HOURS AS NEEDED Patient taking differently: Take 50 mg by mouth every 6 (six) hours as needed for moderate pain.  09/01/19  Yes Wyatt Portela, MD  traZODone (DESYREL) 100 MG tablet TAKE 1 TABLET BY MOUTH AT BEDTIME 09/20/19  Yes [name], [name], MD  triamcinolone cream (KENALOG) 0.1 % Apply 1 application topically 2 (two) times daily as needed (for rash). Applies to face   Yes [provider]  BD DISP NEEDLES 18G X 1-1/2" MISC USE TO INJECT TESTOSTERONE INTO THE MUSCLE AS DIRECTED EVERY 14 DAYS 07/26/19   [name], [name], MD  SAFETY-LOK 3CC SYR 22GX1.5" 22G X 1-1/2" 3 ML MISC USE TO INJECT 1 ML OF TESTOSTERONE EVERY 14 DAYS 07/26/19   [name], [name], MD   Physical Exam: Blood pressure 131/73, pulse (!) 106, temperature (!) 101.4 F (38.6 C), temperature source Rectal, resp. rate 19, height 6' (1.829 m), weight 81.6 kg, SpO2 95 %. 1. General:  in No Acute distress    Chronically ill  -appearing 2. Psychological: Alert and  Oriented 3. Head/ENT:     Dry Mucous Membranes  Head Non traumatic, neck supple                             Poor Dentition 4. SKIN:  decreased Skin turgor,  Skin clean Dry and intact no rash 5. Heart: Regular rate and rhythm no Murmur, no Rub or gallop 6. Lungs: no wheezes or crackles   7. Abdomen: Soft,  non-tender,  Distended bowel sounds present, sp colostomy 8. Lower extremities: no clubbing, cyanosis, no  edema 9. Neurologically quadroplegic 10. MSK: Normal range of motion   All other LABS:     Recent Labs  Lab 10/17/19 2000  WBC 10.6*  NEUTROABS 9.5*  HGB 13.2  HCT 41.3  MCV 92.4  PLT 263     Recent Labs  Lab 10/17/19 2000  NA 137  K 5.4*  CL 109  CO2 15*  GLUCOSE 118*  BUN 67*  CREATININE 2.19*  CALCIUM 8.5*     Recent Labs  Lab 10/17/19 2000  AST 14*  ALT 21  ALKPHOS 53  BILITOT 0.6  PROT 7.4  ALBUMIN 3.5       Cultures:    Component Value Date/Time   SDES ABSCESS LLQ DIVERTICULAR 01/26/2017 0936   SPECREQUEST NONE 01/26/2017 0936   CULT  01/26/2017 0936    RARE ANAEROBIC GRAM NEGATIVE ROD BETA LACTAMASE NEGATIVE RARE ENTEROCOCCUS FAECALIS SEE SEPARATE REPORT FOR ADDITIONAL SUSCEPTIBILTY RESULTS Performed at [facility], [place] [address] [place], [place] [address]    REPTSTATUS 02/07/2017 FINAL 01/26/2017 5465     Radiological Exams on Admission: CT ABDOMEN PELVIS WO CONTRAST  Result Date: 10/17/2019 CLINICAL DATA:  Nausea vomiting and diarrhea with abdominal pain since yesterday. Metastatic renal cancer. Clinical concern for an abdominal abscess. EXAM: CT ABDOMEN AND PELVIS WITHOUT CONTRAST TECHNIQUE: Multidetector CT imaging of the abdomen and pelvis was performed following the standard protocol without IV contrast. Automatic exposure control utilized. COMPARISON:  February 08, 2019. FINDINGS: Lower chest: Bibasilar nonspecific bronchitis and patchy consolidation that is  probably infectious. Probable trace bilateral pleural fluid. Metastatic appearing bilateral pulmonary nodules including a 9 mm next right basilar nodule on series 3, image 14 and 16 mm left basilar next nodule on image 19. Normal heart size. Mild mitral annulus calcification. Trace physiologic amount of pericardial fluid. Hepatobiliary: Cholecystectomy. Normal hepatic size. Normal biliary tree. Metastatic hepatic disease is not excluded on the current unenhanced images. Trace intraperitoneal free fluid, probably metastatic. Pancreas: Mild atrophy. No acute pancreatitis. Air in the distal common bile duct and proximal main pancreatic duct, suggesting remote sphincterotomy. Spleen: Normally sized spleen with benign granulomatous calcification. Adrenals/Urinary Tract: Abnormal left adrenal gland thickening and nodularity with contiguous peripherally calcified lobular soft tissue with internal cystic necrosis in the left nephrectomy bed, concerning for recurrence malignancy or metastatic nodal disease with probable extension to the left adrenal gland. Small 10 mm right adrenal dense nodularity worrisome for a metastatic collision tumor given the adjacent 10 mm 6 Hounsfield unit adenoma. Normal size and cortical thickness of the right kidney. Moderate right perinephric inflammatory change. Partially characterized 11 mm dense right lower pole exophytic lesion. No right hydroureteronephrosis or obstructing calculus. Abnormal wall thickening of the urinary bladder with perivesicular inflammatory change. Stomach/Bowel: No bowel obstruction. Mild diverticulosis without acute diverticulitis. Left upper abdominal quadrant colostomy. Neither a normal nor abnormal appendix is apparent, and may be obscured or invaded by the below-described soft tissue mass. Vascular/Lymphatic: Bulky metastatic appearing lymph nodes in  the right abdomen and pelvis and right iliac chain, including a malignant or metastatic 7 cm transverse by 7 cm  antero posterior by 13 cm craniocaudal lobulated soft tissue mass in the right flank with invasion into the right lateral abdominal wall musculature. Thoracoabdominal calcified atherosclerosis without an abdominal aortic aneurysm. Reproductive: Mild prostatomegaly with adjacent fat stranding. Other: Bilateral nonspecific inguinal lymph nodes. Bilateral gynecomastia. Musculoskeletal: Respiratory motion degrades evaluation of the ribcage. No obvious rib erosion, but possible costochondral invasion by the adjacent right abdominal soft tissue mass. Diffuse bone demineralization. Dense sclerotic lesions in the right S1 sacrum, left acetabulum bilateral femoral heads and ischial tuberosities and L3 vertebral body with similar distributions but a couple slightly increased in size is compared to the remote February 27, 2012 CT imaging IMPRESSION: Bulky metastatic adenopathy including a 7 x 7 x 13 cm metastatic soft tissue mass with abdominal wall and possible costochondral or appendiceal invasion along the right flank. No bowel obstruction. Bibasilar consolidation that is probably infectious. Metastatic-appearing nodules in both lower lungs. Imaging findings that would support a clinical diagnosis of a nonspecific cystitis/prostatitis. Left nephrectomy with abnormal soft tissue in the nephrectomy bed and dense soft tissue nodularity of both adrenal glands concerning for metastatic disease. Partially characterized 10 mm right renal lower pole dense exophytic lesion, possibly benign or malignant or metastatic. Thoracoabdominal aortic calcified atherosclerosis. Chronic pelvic sclerotic lesions, some slightly increased in size since 2013. Electronically Signed   By: Revonda Humphrey   On: 10/17/2019 22:43   DG Chest 2 View  Result Date: 10/17/2019 CLINICAL DATA:  Shortness of breath with nausea and vomiting EXAM: CHEST - 2 VIEW COMPARISON:  CT from 03/10/2019 FINDINGS: Cardiac shadows within normal limits. Aortic calcifications are  seen. The lungs are well aerated bilaterally. Small nodular changes are noted similar to that seen on prior CT. No focal infiltrate is noted. Mild bibasilar atelectatic changes are seen. No effusion is noted. IMPRESSION: Small nodules bilaterally with bibasilar atelectasis. Electronically Signed   By: Inez Catalina M.D.   On: 10/17/2019 21:19    Chart has been reviewed    Assessment/Plan   64 y.o. male with medical history significant of  past medical history of renal cell carcinoma treated with nephrectomy now with metastasis, prior brainstem cva with quadriplegia  at 64 years old; diverticular perforation with colostomy, CKD, HTN, pituitary macroadenoma sp ressection, enlarging left arm mass.   Admitted for dehydration  Present on Admission: . CAP (community acquired pneumonia) -   will admit for treatment of CAP will start on appropriate antibiotic coverage.   Obtain:  sputum cultures,                 Obtain respiratory panel                   blood cultures                   strep pneumo UA antigen,                   given diarrhea also check for Legionella.                Provide oxygen as needed.   . Essential hypertension - hold home medications given hypotension  Sepsis -  Given history of pituitary normal check cortisol level if persistently hypotensive may benefit from stress dose steroids  -Patient meets sepsis criteria with  Tachycardia, hypotension, fever   Initial lactic acid Lactic Acid,  Venous    Component Value Date/Time   LATICACIDVEN 1.1 10/17/2019 2214   Source most likely pneumonia   -We will rehydrate, treat with IV antibiotics, follow lactic acid - Await results of blood and urine culture and adjust antibiotics as needed - Obtain MRSA serologies  - Obtain respiratory panel    . Neurogenic bladder - condom cath in place, pt need straight cath   . Renal carcinoma metastatic with carcinomatosis - followed by oncology  Recently started radiation therapy for a  nodule in his arm   . Hyperlipidemia-chronic stable   . 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 AKI (acute kidney injury) ([place]) -rehydrate obtain urine electrolytes Hold lisinopril  Hyperkalemia mild will rehydrate and recheck monitor on telemetry    . Hypothyroid - - Check TSH continue home medications at current dose  . 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 Dehydration -will rehydrate   Other plan as per orders.  DVT prophylaxis:   Lovenox     Code Status:  FULL CODE as per patient    I had personally discussed CODE STATUS with patient and family     Family Communication:   Family   at  Bedside  plan of care was discussed  with  Wife,  Disposition Plan:     To home once workup is complete and patient is stable                  Would benefit from PT/OT eval prior to DC  Ordered                                              Nutrition    consulted                  Wound care  consulted                      Consults called: will notify through apic Dr. Osker Mason  Admission status:  ED Disposition    ED Disposition Condition [place]: [facility] [[id number]]  Level of Care: Telemetry [5]  Admit to tele based on following criteria: Other see comments  Comments: hyperkalemia  Covid Evaluation: Asymptomatic Screening Protocol (No Symptoms)  Diagnosis: Dehydration [276.51.ICD-9-CM]  Admitting Physician: Toy Baker [[id number]]  Attending Physician: Toy Baker [[id number]]       Obs      Level of care     tele  For   24H       Precautions: admitted as PUI  No active isolations    PPE: Used by the provider:   P100  eye Goggles,  Gloves    Dosha Broshears 10/17/2019, 1:58 AM    [facility]     after 2 AM please page floor coverage PA If 7AM-7PM, please contact the day team taking care of the patient using [email or web address]   Patient was evaluated in the  context of the global COVID-19 pandemic, which necessitated consideration that the patient might be at risk for infection with the SARS-CoV-2 virus that causes COVID-19. Institutional protocols and algorithms that pertain to the evaluation of patients at risk for COVID-19 are in a state of rapid change based on information released by regulatory bodies including the CDC and federal and state  organizations. These policies and algorithms were followed during the patient's care.

## 2019-10-17 NOTE — ED Notes (Signed)
Per wife pt abdominal pain with n/v/d since yesterday; since has appeared SOB and hot to touch. Radiation due 10/27/19; hx of kidney cancer spreading to abdomen and lungs.

## 2019-10-17 NOTE — ED Provider Notes (Signed)
[place] DEPT Provider Note   CSN: [id number] Arrival date & time: 10/17/19  1848     History Chief Complaint  Patient presents with  . Abdominal Pain    Austin [name] is a 64 y.o. male.  Patient is a 64 year old male with a past medical history of renal cell carcinoma treated with nephrectomy, prior brainstem cva with quadriplegia, as well as diverticular perforation with colostomy.  His disease became metastatic several years ago.  Patient presents today with complaints of abdominal cramping followed by increased ostomy outputs.  This occurred yesterday.  The stools were not melanotic or bloody.  He has had no fever or chills.  Today he seemed more short of breath and presents for evaluation.  The history is provided by the patient and the spouse.  Abdominal Pain Pain location:  Generalized Pain quality: cramping   Pain radiates to:  Does not radiate Pain severity:  Moderate Onset quality:  Gradual Timing:  Constant Progression:  Worsening Chronicity:  New Relieved by:  Nothing Worsened by:  Nothing Ineffective treatments:  None tried      Past Medical History:  Diagnosis Date  . Brainstem stroke ([place]) 10/16/2008   Qualifier: Diagnosis of  By: [name] LPN, [name]    . C. difficile colitis 02/08/2017  . Cerebrovascular accident ([place]) 1994   hx of brainstem stroke, residual diminished lung capacity  . Chronic kidney disease    Left Renal Mass  . 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 Diverticulitis   . Duodenal ulcer   . GIB (gastrointestinal bleeding) 05/28/2017  . Hx of colonic polyps   . Hypertension    has been off BP meds since GI bleed was thought to have been caused by previously prescribed lisinopril   . Hypogonadism   . Intra-abdominal abscess ([place]) 01/23/2017  . Iron deficiency anemia   . 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 Open abdominal  incision with drainage 01/26/2017  . Pituitary adenoma ([place])   . 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 Pneumonia   . PONV (postoperative nausea and vomiting)    vomited after colostomy placement   . Pre-diabetes    last a1c 5.6   . 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[place])   . Urinary incontinence   . Venous stasis    edema    Patient Active Problem List   Diagnosis Date Noted  . Malignant neoplasm of left upper extremity ([place]) 10/14/2019  . Hypothyroid 07/14/2018  . Colostomy dysfunction ([place]) 06/16/2017  . 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 AKI (acute kidney injury) ([place])   . Colostomy in place  05/30/2017  . Blood loss anemia 05/29/2017  . 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 Perforation of sigmoid colon due to diverticulitis s/p colostomy 9/18 09/27/2016  . Fatigue 05/23/2015  . Hyperlipidemia 10/17/2013  . Renal carcinoma metastatic with carcinomatosis 11/09/2012  . Type 2 diabetes mellitus ([place]) 03/31/2012  . Anemia associated with acute blood loss 02/28/2012  . Abdominal pain, other specified site 02/27/2012  . Low testosterone 06/21/2010  . WEAKNESS 04/03/2010  . FACIAL WEAKNESS 04/03/2010  . EDEMA 01/15/2009  . Pituitary tumor 01/15/2009  . Essential hypertension 10/16/2008  . Neurogenic bladder 10/16/2008  . OTHER SEBORRHEIC DERMATITIS 10/16/2008  . COLONIC POLYPS, HX OF 10/16/2008    Past Surgical History:  Procedure Laterality Date  . CHOLECYSTECTOMY    .  COLON RESECTION N/A 05/04/2017   Procedure: LAPAROSCOPIC SIGMOID COLON RESECTION WITH END COLSOTOMY ERAS PATHWAY;  Surgeon: Alphonsa Overall, MD;  Location: [facility];  Service: General;  Laterality: N/A;  . COLONOSCOPY  02/27/2012   Procedure: COLONOSCOPY;  Surgeon: Jerene Bears, MD;  Location: [facility];  Service: Gastroenterology;  Laterality: N/A;  . COLONOSCOPY N/A 05/31/2017   Procedure: COLONOSCOPY;  Surgeon: Milus Banister, MD;  Location: [facility];  Service:  Endoscopy;  Laterality: N/A;  . COLOSTOMY TAKEDOWN N/A 08/20/2017   Procedure: LAPAROSCOPIC ASSISTED REVISION END COLOSTOMY AND ENTEROLYSIS OF ADHESIONS;  Surgeon: Alphonsa Overall, MD;  Location: [facility];  Service: General;  Laterality: N/A;  ERAS PATHWAY  . ESOPHAGOGASTRODUODENOSCOPY N/A 05/31/2017   Procedure: ESOPHAGOGASTRODUODENOSCOPY (EGD);  Surgeon: Milus Banister, MD;  Location: [facility];  Service: Endoscopy;  Laterality: N/A;  . gamma knife radiation surgery  05/2010   for pituitary  . IR RADIOLOGIST EVAL & MGMT  02/12/2017  . IR RADIOLOGIST EVAL & MGMT  03/17/2017  . IR RADIOLOGIST EVAL & MGMT  04/14/2017  . IR RADIOLOGIST EVAL & MGMT  02/05/2017  . PITUITARY SURGERY  2007   nose approach  . ROBOT ASSISTED LAPAROSCOPIC NEPHRECTOMY  04/23/2012   Procedure: ROBOTIC ASSISTED LAPAROSCOPIC NEPHRECTOMY;  Surgeon: Alexis Frock, MD;  Location: [facility];  Service: Urology;  Laterality: Left;  radical  . stomach peg  02/1993   removed 5-6 years later  . TRACHEOSTOMY TUBE PLACEMENT  02/1993   removed 04/1993  . UMBILICAL HERNIA REPAIR  04/23/2012   Procedure: HERNIA REPAIR UMBILICAL ADULT;  Surgeon: Alexis Frock, MD;  Location: [facility];  Service: Urology;;  . [name]  . vocal cord surgery     injected with collagen, then fat from stomach to improve speech s/p stroke       Family History  Problem Relation Age of Onset  . Alcohol abuse Father   . Throat cancer Father   . Esophageal cancer Father 38  . Arthritis Mother   . Hyperlipidemia Mother   . Uterine cancer Mother 42  . Colon cancer Brother   . Arthritis Maternal Grandmother   . Hypertension Brother   . Stroke Paternal Grandmother     Social History   Tobacco Use  . Smoking status: Former Smoker    Packs/day: 0.50    Years: 23.00    Pack years: 11.50    Types: Cigarettes    Quit date: 09/24/1992    Years since quitting: 27.0  . Smokeless tobacco: Never Used  Substance Use Topics  . Alcohol use: No    Comment:  rare  . Drug use: No    Home Medications Prior to Admission medications   Medication Sig Start Date End Date Taking? Authorizing Provider  acetaminophen (TYLENOL) 500 MG tablet Take 1 tablet (500 mg total) by mouth every 6 (six) hours as needed for mild pain, moderate pain, fever or headache. 06/02/17   Florencia Reasons, MD  amLODipine (NORVASC) 10 MG tablet Take 1 tablet (10 mg total) by mouth daily. 09/30/19   [name], [name], MD  BD DISP NEEDLES 18G X 1-1/2" MISC USE TO INJECT TESTOSTERONE INTO THE MUSCLE AS DIRECTED EVERY 14 DAYS 07/26/19   [name], [name], MD  cetirizine (ZYRTEC) 10 MG tablet Take 10 mg by mouth daily with breakfast.    [provider]  escitalopram (LEXAPRO) 10 MG tablet TAKE 1 TABLET BY MOUTH IN THE MORNING 09/20/19   [name], [name], MD  furosemide (LASIX) 20 MG tablet Take 1 tablet (20 mg total) by mouth daily. 06/05/17   [name], [name], MD  ketoconazole (NIZORAL) 2 % cream APPLY AS NEEDED TO RASH Patient taking differently: Apply to groin or legs daily as needed for rash 10/23/16   [name], MD  levothyroxine (SYNTHROID) 25 MCG tablet TAKE 1 TABLET BY MOUTH ONCE DAILY BEFORE BREAKFAST 10/10/19   [name], [name], MD  lisinopril-hydrochlorothiazide (ZESTORETIC) 20-12.5 MG tablet Take 1 tablet by mouth daily. 06/28/19   [name], [name], MD  Melatonin 3 MG TABS Take 3 mg by mouth at bedtime as needed (for sleep).    [provider]  Multiple Vitamin (MULTIVITAMIN WITH MINERALS) TABS tablet Take 1 tablet by mouth daily with breakfast.     [provider]  SAFETY-LOK 3CC SYR 22GX1.5" 22G X 1-1/2" 3 ML MISC USE TO INJECT 1 ML OF TESTOSTERONE EVERY 14 DAYS 07/26/19   [name], [name], MD  testosterone enanthate (DELATESTRYL) 200 MG/ML injection INJECT 1 ML INTRAMUSCULARLY  EVERY TWO WEEKS 07/22/19   [name], [name], MD  traMADol (ULTRAM) 50 MG tablet TAKE 1 TABLET BY MOUTH EVERY 6 HOURS AS NEEDED 09/01/19   Wyatt Portela, MD    traZODone (DESYREL) 100 MG tablet TAKE 1 TABLET BY MOUTH AT BEDTIME 09/20/19   [name], [name], MD  triamcinolone cream (KENALOG) 0.1 % Apply 1 application topically 2 (two) times daily as needed (for rash). Applies to face    [provider]    Allergies    Penicillins  Review of Systems   Review of Systems  Gastrointestinal: Positive for abdominal pain.  All other systems reviewed and are negative.   Physical Exam Updated Vital Signs BP (!) 149/64 (BP Location: Right Arm)   Pulse (!) 119   Temp 97.9 F (36.6 C) (Oral)   Resp 19   Ht 6' (1.829 m)   Wt 81.6 kg   SpO2 96%   BMI 24.41 kg/m   Physical Exam Vitals and nursing note reviewed.  Constitutional:      General: He is not in acute distress.    Appearance: He is well-developed. He is not diaphoretic.  HENT:     Head: Normocephalic and atraumatic.  Cardiovascular:     Rate and Rhythm: Normal rate and regular rhythm.     Heart sounds: No murmur. No friction rub.  Pulmonary:     Effort: Pulmonary effort is normal. No respiratory distress.     Breath sounds: Normal breath sounds. No wheezing or rales.  Abdominal:     General: Bowel sounds are normal. There is no distension.     Palpations: Abdomen is soft.     Tenderness: There is generalized abdominal tenderness. There is no right CVA tenderness, left CVA tenderness, guarding or rebound.  Musculoskeletal:        General: Normal range of motion.     Cervical back: Normal range of motion and neck supple.  Skin:    General: Skin is warm and dry.  Neurological:     Mental Status: He is alert and oriented to person, place, and time.     Coordination: Coordination normal.     ED Results / Procedures / Treatments   Labs (all labs ordered are listed, but only abnormal results are displayed) Labs Reviewed  CULTURE, BLOOD (ROUTINE X 2)  CULTURE, BLOOD (ROUTINE X 2)  COMPREHENSIVE METABOLIC PANEL  LACTIC ACID, PLASMA  LACTIC ACID, PLASMA  CBC WITH  DIFFERENTIAL/PLATELET  PROTIME-INR  URINALYSIS, ROUTINE W REFLEX MICROSCOPIC    EKG None  Radiology No results found.  Procedures Procedures (including critical care time)  Medications Ordered in ED Medications - No data to display  ED Course  I have reviewed the triage vital signs and the nursing notes.  Pertinent labs & imaging results that were available during my care of the patient were reviewed by me and considered in my medical decision making (see chart for details).    MDM Rules/Calculators/A&P  Patient is a 64 year old male with extensive past medical history including prior nephrectomy secondary to renal cell carcinoma which has become metastatic, brainstem stroke with limited use of his extremities, and prior diverticular rupture with colostomy.  He presents today with complaints of fevers, weakness, and increased ostomy outs since yesterday.  He has been having looser stools than normal that have been nonbloody and nonmelanotic.  Patient's work-up shows a white count of 10.6 and electrolytes reflective of AKI/dehydration.  Patient given 1 L of normal saline.  His CT scan shows metastatic disease, however no acute process within his abdomen or pelvis.  He will require admission for hydration and monitoring of his renal function.  Final Clinical Impression(s) / ED Diagnoses Final diagnoses:  None    Rx / DC Orders ED Discharge Orders    None       Veryl Speak, MD 10/17/19 2309

## 2019-10-18 ENCOUNTER — Other Ambulatory Visit: Payer: Self-pay

## 2019-10-18 DIAGNOSIS — G4700 Insomnia, unspecified: Secondary | ICD-10-CM | POA: Diagnosis present

## 2019-10-18 DIAGNOSIS — E119 Type 2 diabetes mellitus without complications: Secondary | ICD-10-CM

## 2019-10-18 DIAGNOSIS — J189 Pneumonia, unspecified organism: Secondary | ICD-10-CM | POA: Diagnosis present

## 2019-10-18 DIAGNOSIS — I69365 Other paralytic syndrome following cerebral infarction, bilateral: Secondary | ICD-10-CM | POA: Diagnosis not present

## 2019-10-18 DIAGNOSIS — F329 Major depressive disorder, single episode, unspecified: Secondary | ICD-10-CM | POA: Diagnosis present

## 2019-10-18 DIAGNOSIS — E860 Dehydration: Secondary | ICD-10-CM | POA: Diagnosis present

## 2019-10-18 DIAGNOSIS — Z933 Colostomy status: Secondary | ICD-10-CM | POA: Diagnosis not present

## 2019-10-18 DIAGNOSIS — I129 Hypertensive chronic kidney disease with stage 1 through stage 4 chronic kidney disease, or unspecified chronic kidney disease: Secondary | ICD-10-CM | POA: Diagnosis present

## 2019-10-18 DIAGNOSIS — A419 Sepsis, unspecified organism: Secondary | ICD-10-CM | POA: Diagnosis present

## 2019-10-18 DIAGNOSIS — Z87891 Personal history of nicotine dependence: Secondary | ICD-10-CM | POA: Diagnosis not present

## 2019-10-18 DIAGNOSIS — Z85528 Personal history of other malignant neoplasm of kidney: Secondary | ICD-10-CM | POA: Diagnosis not present

## 2019-10-18 DIAGNOSIS — N1831 Chronic kidney disease, stage 3a: Secondary | ICD-10-CM | POA: Diagnosis present

## 2019-10-18 DIAGNOSIS — E875 Hyperkalemia: Secondary | ICD-10-CM | POA: Diagnosis present

## 2019-10-18 DIAGNOSIS — E785 Hyperlipidemia, unspecified: Secondary | ICD-10-CM | POA: Diagnosis present

## 2019-10-18 DIAGNOSIS — G8250 Quadriplegia, unspecified: Secondary | ICD-10-CM | POA: Diagnosis present

## 2019-10-18 DIAGNOSIS — E039 Hypothyroidism, unspecified: Secondary | ICD-10-CM | POA: Diagnosis present

## 2019-10-18 DIAGNOSIS — C649 Malignant neoplasm of unspecified kidney, except renal pelvis: Secondary | ICD-10-CM | POA: Diagnosis present

## 2019-10-18 DIAGNOSIS — Z905 Acquired absence of kidney: Secondary | ICD-10-CM | POA: Diagnosis not present

## 2019-10-18 DIAGNOSIS — D352 Benign neoplasm of pituitary gland: Secondary | ICD-10-CM | POA: Diagnosis present

## 2019-10-18 DIAGNOSIS — Z811 Family history of alcohol abuse and dependence: Secondary | ICD-10-CM | POA: Diagnosis not present

## 2019-10-18 DIAGNOSIS — N319 Neuromuscular dysfunction of bladder, unspecified: Secondary | ICD-10-CM | POA: Diagnosis present

## 2019-10-18 DIAGNOSIS — E1122 Type 2 diabetes mellitus with diabetic chronic kidney disease: Secondary | ICD-10-CM | POA: Diagnosis present

## 2019-10-18 DIAGNOSIS — C779 Secondary and unspecified malignant neoplasm of lymph node, unspecified: Secondary | ICD-10-CM | POA: Diagnosis present

## 2019-10-18 DIAGNOSIS — N179 Acute kidney failure, unspecified: Secondary | ICD-10-CM | POA: Diagnosis present

## 2019-10-18 DIAGNOSIS — Z20822 Contact with and (suspected) exposure to covid-19: Secondary | ICD-10-CM | POA: Diagnosis present

## 2019-10-18 LAB — COMPREHENSIVE METABOLIC PANEL
ALT: 18 U/L (ref 0–44)
AST: 15 U/L (ref 15–41)
Albumin: 3.1 g/dL — ABNORMAL LOW (ref 3.5–5.0)
Alkaline Phosphatase: 46 U/L (ref 38–126)
Anion gap: 9 (ref 5–15)
BUN: 65 mg/dL — ABNORMAL HIGH (ref 8–23)
CO2: 16 mmol/L — ABNORMAL LOW (ref 22–32)
Calcium: 7.5 mg/dL — ABNORMAL LOW (ref 8.9–10.3)
Chloride: 113 mmol/L — ABNORMAL HIGH (ref 98–111)
Creatinine, Ser: 2.08 mg/dL — ABNORMAL HIGH (ref 0.61–1.24)
GFR calc Af Amer: 38 mL/min — ABNORMAL LOW (ref >60)
GFR calc non Af Amer: 32 mL/min — ABNORMAL LOW (ref >60)
Glucose, Bld: 109 mg/dL — ABNORMAL HIGH (ref 70–99)
Potassium: 5.4 mmol/L — ABNORMAL HIGH (ref 3.5–5.1)
Sodium: 138 mmol/L (ref 135–145)
Total Bilirubin: 0.5 mg/dL (ref 0.3–1.2)
Total Protein: 6.8 g/dL (ref 6.5–8.1)

## 2019-10-18 LAB — CBC
HCT: 36.3 % — ABNORMAL LOW (ref 39.0–52.0)
Hemoglobin: 11.5 g/dL — ABNORMAL LOW (ref 13.0–17.0)
MCH: 29.4 pg (ref 26.0–34.0)
MCHC: 31.7 g/dL (ref 30.0–36.0)
MCV: 92.8 fL (ref 80.0–100.0)
Platelets: 209 10*3/uL (ref 150–400)
RBC: 3.91 MIL/uL — ABNORMAL LOW (ref 4.22–5.81)
RDW: 16.4 % — ABNORMAL HIGH (ref 11.5–15.5)
WBC: 11.9 10*3/uL — ABNORMAL HIGH (ref 4.0–10.5)
nRBC: 0 % (ref 0.0–0.2)

## 2019-10-18 LAB — PROCALCITONIN
Procalcitonin: 14.77 ng/mL
Procalcitonin: 8.49 ng/mL

## 2019-10-18 LAB — STREP PNEUMONIAE URINARY ANTIGEN: Strep Pneumo Urinary Antigen: NEGATIVE

## 2019-10-18 LAB — D-DIMER, QUANTITATIVE: D-Dimer, Quant: 0.77 ug/mL-FEU — ABNORMAL HIGH (ref 0.00–0.50)

## 2019-10-18 LAB — C-REACTIVE PROTEIN: CRP: 8.1 mg/dL — ABNORMAL HIGH (ref <1.0)

## 2019-10-18 LAB — PHOSPHORUS: Phosphorus: 3.1 mg/dL (ref 2.5–4.6)

## 2019-10-18 LAB — HIV ANTIBODY (ROUTINE TESTING W REFLEX): HIV Screen 4th Generation wRfx: NONREACTIVE

## 2019-10-18 LAB — LACTATE DEHYDROGENASE: LDH: 127 U/L (ref 98–192)

## 2019-10-18 LAB — FERRITIN: Ferritin: 106 ng/mL (ref 24–336)

## 2019-10-18 LAB — SARS CORONAVIRUS 2 (TAT 6-24 HRS): SARS Coronavirus 2: NEGATIVE

## 2019-10-18 LAB — CORTISOL: Cortisol, Plasma: 40 ug/dL

## 2019-10-18 LAB — FIBRINOGEN: Fibrinogen: 653 mg/dL — ABNORMAL HIGH (ref 210–475)

## 2019-10-18 LAB — MAGNESIUM: Magnesium: 2.1 mg/dL (ref 1.7–2.4)

## 2019-10-18 LAB — TSH: TSH: 1.99 u[IU]/mL (ref 0.350–4.500)

## 2019-10-18 MED ORDER — HYDROCODONE-ACETAMINOPHEN 5-325 MG PO TABS: 1.0000 | ORAL_TABLET | ORAL | Status: DC | PRN

## 2019-10-18 MED ORDER — SODIUM CHLORIDE 0.9 % IV SOLN: INTRAVENOUS | Status: DC

## 2019-10-18 MED ORDER — HYDROCORTISONE NA SUCCINATE PF 100 MG IJ SOLR
100.0000 mg | Freq: Three times a day (TID) | INTRAMUSCULAR | Status: DC
  Administered 2019-10-18: 100 mg via INTRAVENOUS
  Filled 2019-10-18 (×2): qty 2

## 2019-10-18 MED ORDER — LEVOTHYROXINE SODIUM 25 MCG PO TABS
25.0000 ug | ORAL_TABLET | Freq: Every day | ORAL | Status: DC
  Administered 2019-10-18 – 2019-10-20 (×2): 25 ug via ORAL
  Filled 2019-10-18 (×2): qty 1

## 2019-10-18 MED ORDER — ESCITALOPRAM OXALATE 10 MG PO TABS
10.0000 mg | ORAL_TABLET | Freq: Every morning | ORAL | Status: DC
  Administered 2019-10-18 – 2019-10-20 (×2): 10 mg via ORAL
  Filled 2019-10-18 (×2): qty 1

## 2019-10-18 MED ORDER — SODIUM CHLORIDE 0.9 % IV BOLUS
500.0000 mL | Freq: Once | INTRAVENOUS | Status: AC
  Administered 2019-10-18: 500 mL via INTRAVENOUS

## 2019-10-18 MED ORDER — ACETAMINOPHEN 325 MG PO TABS: 650.0000 mg | ORAL_TABLET | Freq: Four times a day (QID) | ORAL | Status: DC | PRN

## 2019-10-18 MED ORDER — ONDANSETRON HCL 4 MG PO TABS: 4.0000 mg | ORAL_TABLET | Freq: Four times a day (QID) | ORAL | Status: DC | PRN

## 2019-10-18 MED ORDER — LACTATED RINGERS IV SOLN: INTRAVENOUS | Status: DC

## 2019-10-18 MED ORDER — TRAMADOL HCL 50 MG PO TABS: 50.0000 mg | ORAL_TABLET | Freq: Four times a day (QID) | ORAL | Status: DC | PRN

## 2019-10-18 MED ORDER — ONDANSETRON HCL 4 MG/2ML IJ SOLN: 4.0000 mg | Freq: Four times a day (QID) | INTRAMUSCULAR | Status: DC | PRN

## 2019-10-18 MED ORDER — ENOXAPARIN SODIUM 40 MG/0.4ML ~~LOC~~ SOLN
40.0000 mg | SUBCUTANEOUS | Status: DC
  Administered 2019-10-18 – 2019-10-20 (×3): 40 mg via SUBCUTANEOUS
  Filled 2019-10-18 (×3): qty 0.4

## 2019-10-18 MED ORDER — TRAZODONE HCL 100 MG PO TABS
100.0000 mg | ORAL_TABLET | Freq: Every day | ORAL | Status: DC
  Administered 2019-10-18 – 2019-10-19 (×2): 100 mg via ORAL
  Filled 2019-10-18 (×2): qty 1

## 2019-10-18 MED ORDER — ACETAMINOPHEN 650 MG RE SUPP: 650.0000 mg | Freq: Four times a day (QID) | RECTAL | Status: DC | PRN

## 2019-10-18 MED ORDER — SODIUM ZIRCONIUM CYCLOSILICATE 5 G PO PACK
5.0000 g | PACK | Freq: Once | ORAL | Status: AC
  Administered 2019-10-18: 5 g via ORAL
  Filled 2019-10-18: qty 1

## 2019-10-18 NOTE — Consult Note (Signed)
[place] Nurse ostomy consult note Stoma type/location: LLQ end colostomy Stomal assessment/size: 1 3/4" pink and moist budded stoma, revised.  Wife is at bedside.  States pouch is changed Saturday and Wednesday.  POuch is intact.  I will order supplies and spouse will change pouch on schedule.  She understands that she can ask for help from the [facility] team.   Peristomal assessment: wife states intact  No barrier ring needed. Patient wears ostomy belt.  Treatment options for stomal/peristomal skin: belt  2 piece pouch Output soft brown stool Ostomy pouching: 2pc. 2 3/4" pouch with belt.  (LAWSON # [id number]) (LASON #2) Education provided: see above   Enrolled patient in [place] program: Yes previously Will not follow at this time.  Please re-consult if needed.  Domenic Moras MSN, RN, FNP-BC [facility] Wound, Ostomy, Continence Nurse Pager [phone]

## 2019-10-18 NOTE — Progress Notes (Addendum)
PROGRESS NOTE  Austin [name]  ZJQ:[id number] DOB: July 19, 1956 DOA: 10/17/2019 PCP: [name], MD  Brief Narrative: Austin [name] is a 64 y.o. male with a history of brainstem CVA with resultant quadriplegia, diverticular perforation s/p colostomy, pituitary macroadenoma s/p resection, and renal cell carcinoma s/p nephrectomy now with metastases including left arm mass, HTN, hypothyroidism, and stage IIIa CKD. He presented to the ED with abdominal cramping, increased ostomy output, shortness of breath, poor per oral intake. In the ED he was febrile with leukocytosis, AKI suggestive of dehydration, with CT showing widespread metastatic disease with bibasilar consolidation most compatible with pneumonia. IV fluids and antibiotics were started and the patient admitted.  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 PCT rising to 14.77. Covid negative. - Continue IV antibiotics ceftriaxone, azithromycin. - Supplemental oxygen as needed - Monitor culture data, sputum culture ordered.  Dehydration with AKI on stage IIIa CKD and hyperkalemia and NAGMA:  - Continue IVF, monitor metabolic panel. Recheck this PM shows very mild lower extremity edema, will decrease IVF rate and continue monitoring, may need lasix tmrw which may help with hyperkalemia.  Neurogenic bladder, quadriplegia s/p brainstem CVA:  - Continue condom catheter, though patient has been requiring I/O catheterizations QID which has resolved previous issue of recurrent  UTIs  Metastatic renal cell CA: Widespread disease demonstrated on imaging, and left arm mass also noted.  - Dr. Alen Blew added to treatment team as FYI - Continue XRT  Hyperkalemia: No ECG changes.  - Continue telemetry - Lokelma x1 - Hold ACEi - Hydrate - Monitor in AM.  Hypothyroidism: TSH 1.990.  - Continue low dose synthroid  History of pituitary adenoma: With hypotension responsive to resuscitation, doubt hormonally mediated. Cortisol adequate.   Insomnia:  - Continue trazodone  Colostomy:  - WOC consulted.  HTN:  - Hold norvasc and lisinopril, HCTZ with sepsis and AKI.  Depression:  - Continue SSRI  DVT prophylaxis: Lovenox Code Status: Full Family Communication: Wife at bedside Disposition Plan: Return home once vital signs improve with treatment of sepsis due to pneumonia.  Consultants:   None  Procedures:   None  Antimicrobials:  Ceftriaxone, azithromycin    Subjective: Feels better this PM after IVF. No pain reported. Shortness of breath is very minimal at rest, abdominal cramping has improved, remains intermittent, moderate associated with improving loos stool output through colostomy.  Objective: Vitals:   10/18/19 1200 10/18/19 1230 10/18/19 1300 10/18/19 1315  BP: (!) 144/73 127/67 132/65   Pulse: 99 100 100 100  Resp: 15 15 19 18   Temp:      TempSrc:      SpO2: 93% 94% 95% 94%  Weight:      Height:        Intake/Output Summary (Last 24 hours) at 10/18/2019 1408 Last data filed at 10/18/2019 0238 Gross per 24 hour  Intake 1600 ml  Output --  Net 1600 ml   Filed Weights   10/17/19 1854  Weight: 81.6 kg    Gen: 64 y.o. male in no  distress Pulm: Non-labored breathing room air, diminished at bases without crackles or wheezes or stridor.  CV: Regular rate and rhythm. No murmur, rub, or gallop. No JVD, minimal pedal edema. GI: Abdomen soft, not significantly tender, non-distended, with normoactive bowel sounds. No organomegaly or masses  felt. Ext: Warm, left upper anterior arm with irregular large nodular mass nontender without fluctuance. Skin: No rashes, lesions or ulcers on visualized skin. Colostomy site appears wnl. Neuro: Alert and oriented. Quadriplegia noted. Psych: Judgement and insight appear normal. Mood & affect appropriate.   Data Reviewed: I have personally reviewed following labs and imaging studies  CBC: Recent Labs  Lab 10/17/19 2000 10/18/19 0432  WBC 10.6* 11.9*  NEUTROABS 9.5*  --   HGB 13.2 11.5*  HCT 41.3 36.3*  MCV 92.4 92.8  PLT 263 093   Basic Metabolic Panel: Recent Labs  Lab 10/17/19 2000 10/18/19 0432  NA 137 138  K 5.4* 5.4*  CL 109 113*  CO2 15* 16*  GLUCOSE 118* 109*  BUN 67* 65*  CREATININE 2.19* 2.08*  CALCIUM 8.5* 7.5*  MG  --  2.1  PHOS  --  3.1   GFR: Estimated Creatinine Clearance: 38.9 mL/min (A) (by C-G formula based on SCr of 2.08 mg/dL (H)). Liver Function Tests: Recent Labs  Lab 10/17/19 2000 10/18/19 0432  AST 14* 15  ALT 21 18  ALKPHOS 53 46  BILITOT 0.6 0.5  PROT 7.4 6.8  ALBUMIN 3.5 3.1*   No results for input(s): LIPASE, AMYLASE in the last 168 hours. No results for input(s): AMMONIA in the last 168 hours. Coagulation Profile: Recent Labs  Lab 10/17/19 2000  INR 1.1   Cardiac Enzymes: No results for input(s): CKTOTAL, CKMB, CKMBINDEX, TROPONINI in the last 168 hours. BNP (last 3 results) No results for input(s): PROBNP in the last 8760 hours. HbA1C: No results for input(s): HGBA1C in the last 72 hours. CBG: No results for input(s): GLUCAP in the last 168 hours. Lipid Profile: No results for input(s): CHOL, HDL, LDLCALC, TRIG, CHOLHDL, LDLDIRECT in the last 72 hours. Thyroid Function Tests: Recent Labs    10/18/19 0432  TSH 1.990   Anemia Panel: Recent Labs    10/17/19 2000  FERRITIN 106   Urine analysis:    Component Value Date/Time   COLORURINE YELLOW 10/17/2019 2028   APPEARANCEUR CLEAR 10/17/2019 2028   LABSPEC  1.016 10/17/2019 2028   PHURINE 5.0 10/17/2019 2028   GLUCOSEU NEGATIVE 10/17/2019 2028   HGBUR SMALL (A) 10/17/2019 2028   HGBUR negative 02/01/2010 1022   BILIRUBINUR NEGATIVE 10/17/2019 2028   BILIRUBINUR Negative 10/13/2018 1014   KETONESUR NEGATIVE 10/17/2019 2028   PROTEINUR NEGATIVE 10/17/2019 2028   UROBILINOGEN 0.2 10/13/2018 1014   UROBILINOGEN 0.2 02/01/2010 1022   NITRITE NEGATIVE 10/17/2019 2028   LEUKOCYTESUR TRACE (A) 10/17/2019 2028   Recent Results (from the past 240 hour(s))  Culture, blood (Routine x 2)     Status: None (Preliminary result)   Collection Time: 10/17/19  8:03 PM   Specimen: BLOOD  Result Value Ref Range Status   Specimen Description   Final    BLOOD LEFT ANTECUBITAL Performed at [facility], [place] [address] [place], [place] [address]    Special Requests   Final    BOTTLES DRAWN AEROBIC AND ANAEROBIC Blood Culture adequate volume Performed at [place] [address], [place], [place] [address]    Culture   Final    NO GROWTH < 24 HOURS  Performed at [facility], [place] [address], [place], [place] [address]    Report Status PENDING  Incomplete  Culture, blood (Routine x 2)     Status: None (Preliminary result)   Collection Time: 10/17/19  8:41 PM   Specimen: BLOOD  Result Value Ref Range Status   Specimen Description   Final    BLOOD BLOOD RIGHT FOREARM Performed at [facility] [address], [place], [place] [address]    Special Requests   Final    BOTTLES DRAWN AEROBIC AND ANAEROBIC Blood Culture results may not be optimal due to an excessive volume of blood received in culture bottles Performed at [facility] [address] [place], [place] [address]    Culture   Final    NO GROWTH < 24 HOURS Performed at [facility] [address] [place], [place] [address]    Report Status PENDING  Incomplete  SARS CORONAVIRUS 2 (TAT 6-24 HRS)  Nasopharyngeal Nasopharyngeal Swab     Status: None   Collection Time: 10/17/19 11:02 PM   Specimen: Nasopharyngeal Swab  Result Value Ref Range Status   SARS Coronavirus 2 NEGATIVE NEGATIVE Final    Comment: (NOTE) SARS-CoV-2 target nucleic acids are NOT DETECTED. The SARS-CoV-2 RNA is generally detectable in upper and lower respiratory specimens during the acute phase of infection. Negative results do not preclude SARS-CoV-2 infection, do not rule out co-infections with other pathogens, and should not be used as the sole basis for treatment or other patient management decisions. Negative results must be combined with clinical observations, patient history, and epidemiological information. The expected result is Negative. Fact Sheet for Patients: SugarRoll.be Fact Sheet for Healthcare Providers: https://www.woods-mathews.com/ This test is not yet approved or cleared by the [place] FDA and  has been authorized for detection and/or diagnosis of SARS-CoV-2 by FDA under an Emergency Use Authorization (EUA). This EUA will remain  in effect (meaning this test can be used) for the duration of the COVID-19 declaration under Section 56 4(b)(1) of the Act, 21 U.S.C. section 360bbb-3(b)(1), unless the authorization is terminated or revoked sooner. Performed at [facility], [place] [address] [place], [place] [address]       Radiology Studies: CT ABDOMEN PELVIS WO CONTRAST  Result Date: 10/17/2019 CLINICAL DATA:  Nausea vomiting and diarrhea with abdominal pain since yesterday. Metastatic renal cancer. Clinical concern for an abdominal abscess. EXAM: CT ABDOMEN AND PELVIS WITHOUT CONTRAST TECHNIQUE: Multidetector CT imaging of the abdomen and pelvis was performed following the standard protocol without IV contrast. Automatic exposure control utilized. COMPARISON:  February 08, 2019. FINDINGS: Lower chest: Bibasilar nonspecific bronchitis and patchy  consolidation that is probably infectious. Probable trace bilateral pleural fluid. Metastatic appearing bilateral pulmonary nodules including a 9 mm next right basilar nodule on series 3, image 14 and 16 mm left basilar next nodule on image 19. Normal heart size. Mild mitral annulus calcification. Trace physiologic amount of pericardial fluid. Hepatobiliary: Cholecystectomy. Normal hepatic size. Normal biliary tree. Metastatic hepatic disease is not excluded on the current unenhanced images. Trace intraperitoneal free fluid, probably metastatic. Pancreas: Mild atrophy. No acute pancreatitis. Air in the distal common bile duct and proximal main pancreatic duct, suggesting remote sphincterotomy. Spleen: Normally sized spleen with benign granulomatous calcification. Adrenals/Urinary Tract: Abnormal left adrenal gland thickening and nodularity with contiguous peripherally calcified lobular soft tissue with internal cystic necrosis in the left nephrectomy bed, concerning for recurrence malignancy or metastatic nodal disease with  probable extension to the left adrenal gland. Small 10 mm right adrenal dense nodularity worrisome for a metastatic collision tumor given the adjacent 10 mm 6 Hounsfield unit adenoma. Normal size and cortical thickness of the right kidney. Moderate right perinephric inflammatory change. Partially characterized 11 mm dense right lower pole exophytic lesion. No right hydroureteronephrosis or obstructing calculus. Abnormal wall thickening of the urinary bladder with perivesicular inflammatory change. Stomach/Bowel: No bowel obstruction. Mild diverticulosis without acute diverticulitis. Left upper abdominal quadrant colostomy. Neither a normal nor abnormal appendix is apparent, and may be obscured or invaded by the below-described soft tissue mass. Vascular/Lymphatic: Bulky metastatic appearing lymph nodes in the right abdomen and pelvis and right iliac chain, including a malignant or metastatic 7 cm  transverse by 7 cm antero posterior by 13 cm craniocaudal lobulated soft tissue mass in the right flank with invasion into the right lateral abdominal wall musculature. Thoracoabdominal calcified atherosclerosis without an abdominal aortic aneurysm. Reproductive: Mild prostatomegaly with adjacent fat stranding. Other: Bilateral nonspecific inguinal lymph nodes. Bilateral gynecomastia. Musculoskeletal: Respiratory motion degrades evaluation of the ribcage. No obvious rib erosion, but possible costochondral invasion by the adjacent right abdominal soft tissue mass. Diffuse bone demineralization. Dense sclerotic lesions in the right S1 sacrum, left acetabulum bilateral femoral heads and ischial tuberosities and L3 vertebral body with similar distributions but a couple slightly increased in size is compared to the remote February 27, 2012 CT imaging IMPRESSION: Bulky metastatic adenopathy including a 7 x 7 x 13 cm metastatic soft tissue mass with abdominal wall and possible costochondral or appendiceal invasion along the right flank. No bowel obstruction. Bibasilar consolidation that is probably infectious. Metastatic-appearing nodules in both lower lungs. Imaging findings that would support a clinical diagnosis of a nonspecific cystitis/prostatitis. Left nephrectomy with abnormal soft tissue in the nephrectomy bed and dense soft tissue nodularity of both adrenal glands concerning for metastatic disease. Partially characterized 10 mm right renal lower pole dense exophytic lesion, possibly benign or malignant or metastatic. Thoracoabdominal aortic calcified atherosclerosis. Chronic pelvic sclerotic lesions, some slightly increased in size since 2013. Electronically Signed   By: Revonda Humphrey   On: 10/17/2019 22:43   DG Chest 2 View  Result Date: 10/17/2019 CLINICAL DATA:  Shortness of breath with nausea and vomiting EXAM: CHEST - 2 VIEW COMPARISON:  CT from 03/10/2019 FINDINGS: Cardiac shadows within normal limits. Aortic  calcifications are seen. The lungs are well aerated bilaterally. Small nodular changes are noted similar to that seen on prior CT. No focal infiltrate is noted. Mild bibasilar atelectatic changes are seen. No effusion is noted. IMPRESSION: Small nodules bilaterally with bibasilar atelectasis. Electronically Signed   By: Inez Catalina M.D.   On: 10/17/2019 21:19    Scheduled Meds: . enoxaparin (LOVENOX) injection  40 mg Subcutaneous Q24H  . escitalopram  10 mg Oral q morning - 10a  . hydrocortisone sod succinate (SOLU-CORTEF) inj  100 mg Intravenous Q8H  . levothyroxine  25 mcg Oral Q0600  . traZODone  100 mg Oral QHS   Continuous Infusions: . azithromycin Stopped (10/18/19 0137)  . cefTRIAXone (ROCEPHIN)  IV Stopped (10/18/19 0057)     LOS: 0 days   Time spent: 35 minutes.  [name], MD [facility] [email or web address] 10/18/2019, 2:08 PM

## 2019-10-18 NOTE — ED Notes (Signed)
Pt moved onto hospital bed for comfort and wife (visitor at bedside) at bedside provided recliner for comfort.

## 2019-10-19 ENCOUNTER — Inpatient Hospital Stay (HOSPITAL_COMMUNITY): Payer: PPO

## 2019-10-19 LAB — BASIC METABOLIC PANEL
Anion gap: 9 (ref 5–15)
BUN: 53 mg/dL — ABNORMAL HIGH (ref 8–23)
CO2: 17 mmol/L — ABNORMAL LOW (ref 22–32)
Calcium: 7.5 mg/dL — ABNORMAL LOW (ref 8.9–10.3)
Chloride: 116 mmol/L — ABNORMAL HIGH (ref 98–111)
Creatinine, Ser: 1.64 mg/dL — ABNORMAL HIGH (ref 0.61–1.24)
GFR calc Af Amer: 50 mL/min — ABNORMAL LOW (ref >60)
GFR calc non Af Amer: 43 mL/min — ABNORMAL LOW (ref >60)
Glucose, Bld: 114 mg/dL — ABNORMAL HIGH (ref 70–99)
Potassium: 4.1 mmol/L (ref 3.5–5.1)
Sodium: 142 mmol/L (ref 135–145)

## 2019-10-19 LAB — PROCALCITONIN: Procalcitonin: 7.17 ng/mL

## 2019-10-19 LAB — CBC
HCT: 35.2 % — ABNORMAL LOW (ref 39.0–52.0)
Hemoglobin: 11.1 g/dL — ABNORMAL LOW (ref 13.0–17.0)
MCH: 29.7 pg (ref 26.0–34.0)
MCHC: 31.5 g/dL (ref 30.0–36.0)
MCV: 94.1 fL (ref 80.0–100.0)
Platelets: 217 10*3/uL (ref 150–400)
RBC: 3.74 MIL/uL — ABNORMAL LOW (ref 4.22–5.81)
RDW: 16.5 % — ABNORMAL HIGH (ref 11.5–15.5)
WBC: 10.6 10*3/uL — ABNORMAL HIGH (ref 4.0–10.5)
nRBC: 0 % (ref 0.0–0.2)

## 2019-10-19 MED ORDER — HYDRALAZINE HCL 20 MG/ML IJ SOLN
10.0000 mg | Freq: Four times a day (QID) | INTRAMUSCULAR | Status: DC | PRN
  Administered 2019-10-19 – 2019-10-20 (×2): 10 mg via INTRAVENOUS
  Filled 2019-10-19 (×2): qty 1

## 2019-10-19 MED ORDER — AMLODIPINE BESYLATE 10 MG PO TABS
10.0000 mg | ORAL_TABLET | Freq: Every day | ORAL | Status: DC
  Administered 2019-10-20: 10 mg via ORAL
  Filled 2019-10-19: qty 1

## 2019-10-19 MED ORDER — FENTANYL CITRATE (PF) 100 MCG/2ML IJ SOLN
12.5000 ug | Freq: Once | INTRAMUSCULAR | Status: AC
  Administered 2019-10-19: 12.5 ug via INTRAVENOUS
  Filled 2019-10-19: qty 2

## 2019-10-19 MED ORDER — ORAL CARE MOUTH RINSE
15.0000 mL | Freq: Two times a day (BID) | OROMUCOSAL | Status: DC
  Administered 2019-10-19 – 2019-10-20 (×2): 15 mL via OROMUCOSAL

## 2019-10-19 MED ORDER — KETOROLAC TROMETHAMINE 30 MG/ML IJ SOLN
30.0000 mg | Freq: Once | INTRAMUSCULAR | Status: DC
  Filled 2019-10-19: qty 1

## 2019-10-19 NOTE — Progress Notes (Signed)
Initial Nutrition Assessment  DOCUMENTATION CODES:   Not applicable  INTERVENTION:  Monitor for diet advancement  Provide nutrition supplements per SLP recommendations s/p pending MBS   NUTRITION DIAGNOSIS:   Increased nutrient needs related to acute illness, cancer and cancer related treatments(sepsis due to pneumonia; metastatic renal cell carcinoma s/p nephrectomy) as evidenced by estimated needs.   GOAL:   Patient will meet greater than or equal to 90% of their needs   MONITOR:   Labs, Weight trends, I & O's, Diet advancement  REASON FOR ASSESSMENT:   Consult Malnutrition Eval  ASSESSMENT:  RD working remotely.  64 year old male with past medical history of renal cell carcinoma s/p nephrectomy now with metastases including left arm mass, h/o brainstem CVA with resultant quadriplegia, diverticular perforation s/p colostomy, pituitary macroadenoma s/p resection, DM2, HTN, hypothyroidism, and CKD stage IIIa presented to ED with abdominal cramping, increased ostomy output, shortness of breath, and poor oral intake. CT showed widespread metastatic disease with bibasilar consolidation suggestive of pneumonia.  Patient admitted on 3/9 for sepsis due to pneumonia.  Patient feeling better overnight after IVF, reports minimal shortness of breath at rest and intermittent abdominal pain but improving.   Patient noted to be coughing with liquids per nursing and is s/p speech evaluation this morning. Per notes, pt has chronic dysarthria and dysphonia. He reports fluctuations in swallowing that relate to overall medical condition at any given moment. Patient is currently NPO pending MBS. Per flowsheets, he ate 100% of breakfast this morning prior to evaluation. RD will continue to monitor for diet advancement and provide nutrition supplements as appropriate per SLP recommendations.  Non-pitting BUE; Mild pitting BLE edema noted per RN assessment.  Current wt 179.52 lbs No recent wt  history available for review  I/Os: +1895 ml since admit     +295 ml x 24 hrs UOP: 700 ml x 24 hrs  Medications reviewed and include: Zithromax, Rocephin  Labs: BG 109-114 x 24 hrs, BUN 53 (H), Cr 1.64 (H), WBC 10.6 (H)  NUTRITION - FOCUSED PHYSICAL EXAM: Unable to complete at this time, RD working remotely.   Diet Order:   Diet Order            Diet NPO time specified  Diet effective now              EDUCATION NEEDS:   No education needs have been identified at this time  Skin:  Skin Assessment: Reviewed RN Assessment  Last BM:  3/10 (ostomy; 1 piece)  Height:   Ht Readings from Last 1 Encounters:  10/17/19 6' (1.829 m)    Weight:   Wt Readings from Last 1 Encounters:  10/17/19 81.6 kg    Ideal Body Weight:  73.5 kg(adjusted for quadriplegia)  BMI:  Body mass index is 24.41 kg/m.  Estimated Nutritional Needs:   Kcal:  2000-2200  Protein:  100-110  Fluid:  >/= 2 L/day   Lajuan Lines, RD, LDN Clinical Nutrition After Hours/Weekend Pager # in [place]

## 2019-10-19 NOTE — Progress Notes (Signed)
OT Cancellation Note  Patient Details Name: Austin [name] MRN: [id number] DOB: 22-Sep-1955   Cancelled Treatment:    Reason Eval/Treat Not Completed: OT screened, no needs identified, will sign off.  Patient and spouse verbalize no changes in self-care needs. Patient sitting in powerchair during OT screen. No acute OT needs at this time.  [name] OTR/L   [name] 10/19/2019, 3:01 PM

## 2019-10-19 NOTE — Progress Notes (Signed)
PT Cancellation Note  Patient Details Name: Austin [name] MRN: [id number] DOB: 1956/04/01   Cancelled Treatment:    Reason Eval/Treat Not Completed: PT screened, no needs identified, will sign offt and wife quite delightful. Patient in his power chair by wife and nursing. No acute PT needs at this time.   Claretha Cooper 10/19/2019, 2:23 PM  [place] Pager [phone] Office [phone]

## 2019-10-19 NOTE — Evaluation (Signed)
Clinical/Bedside Swallow Evaluation Patient Details  Name: Austin [name] MRN: [id number] Date of Birth: Mar 23, 1956  Today's Date: 10/19/2019 Time: SLP Start Time (ACUTE ONLY): 0930 SLP Stop Time (ACUTE ONLY): 1000 SLP Time Calculation (min) (ACUTE ONLY): 30 min  Past Medical History:  Past Medical History:  Diagnosis Date  . 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 C. difficile colitis 02/08/2017  . Cerebrovascular accident ([place]) 1994   hx of brainstem stroke, residual diminished lung capacity  . Chronic kidney disease    Left Renal Mass  . 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 Diverticulitis   . Duodenal ulcer   . GIB (gastrointestinal bleeding) 05/28/2017  . Hx of colonic polyps   . Hypertension    has been off BP meds since GI bleed was thought to have been caused by previously prescribed lisinopril   . Hypogonadism   . Intra-abdominal abscess ([place]) 01/23/2017  . Iron deficiency anemia   . 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 Open abdominal incision with drainage 01/26/2017  . Pituitary adenoma ([place])   . 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 Pneumonia   . PONV (postoperative nausea and vomiting)    vomited after colostomy placement   . Pre-diabetes    last a1c 5.6   . 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[place])   . Urinary incontinence   . Venous stasis    edema   Past Surgical History:  Past Surgical History:  Procedure Laterality Date  . CHOLECYSTECTOMY    . COLON RESECTION N/A 05/04/2017   Procedure: LAPAROSCOPIC SIGMOID COLON RESECTION WITH END COLSOTOMY ERAS PATHWAY;  Surgeon: Alphonsa Overall, MD;  Location: [facility];  Service: General;  Laterality: N/A;  . COLONOSCOPY  02/27/2012   Procedure: COLONOSCOPY;  Surgeon: Jerene Bears, MD;  Location: [facility];  Service: Gastroenterology;  Laterality: N/A;  . COLONOSCOPY N/A  05/31/2017   Procedure: COLONOSCOPY;  Surgeon: Milus Banister, MD;  Location: [facility];  Service: Endoscopy;  Laterality: N/A;  . COLOSTOMY TAKEDOWN N/A 08/20/2017   Procedure: LAPAROSCOPIC ASSISTED REVISION END COLOSTOMY AND ENTEROLYSIS OF ADHESIONS;  Surgeon: Alphonsa Overall, MD;  Location: [facility];  Service: General;  Laterality: N/A;  ERAS PATHWAY  . ESOPHAGOGASTRODUODENOSCOPY N/A 05/31/2017   Procedure: ESOPHAGOGASTRODUODENOSCOPY (EGD);  Surgeon: Milus Banister, MD;  Location: [facility];  Service: Endoscopy;  Laterality: N/A;  . gamma knife radiation surgery  05/2010   for pituitary  . IR RADIOLOGIST EVAL & MGMT  02/12/2017  . IR RADIOLOGIST EVAL & MGMT  03/17/2017  . IR RADIOLOGIST EVAL & MGMT  04/14/2017  . IR RADIOLOGIST EVAL & MGMT  02/05/2017  . PITUITARY SURGERY  2007   nose approach  . ROBOT ASSISTED LAPAROSCOPIC NEPHRECTOMY  04/23/2012   Procedure: ROBOTIC ASSISTED LAPAROSCOPIC NEPHRECTOMY;  Surgeon: Alexis Frock, MD;  Location: [facility];  Service: Urology;  Laterality: Left;  radical  . stomach peg  02/1993   removed 5-6 years later  . TRACHEOSTOMY TUBE PLACEMENT  02/1993   removed 04/1993  . UMBILICAL HERNIA REPAIR  04/23/2012   Procedure: HERNIA REPAIR UMBILICAL ADULT;  Surgeon: Alexis Frock, MD;  Location: [facility];  Service: Urology;;  . [name]  . vocal cord surgery     injected with collagen, then fat from stomach to improve speech s/p stroke  HPI:  64 y.o. 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 Admitted with  sepsis, CAP, dehydration with AKI .  Pt noted to be coughing with liquids per nursing.  Austin [name] reports increased episodes of coughing lately with liquids - he was planning to discuss these changes with his physician at upcoming appointment.    Assessment / Plan / Recommendation Clinical Impression  Pt participated in clinical swallow evaluation. He has chronic dysarthria and dysphonia.  There are focal CN deficits related to CN VII, X, XII on left.  Pt is a good historian.  He describes fluctuations in swallowing that relate to overall medical condition at any given moment.  Today, he presents with intermittent coughing after drinking thin liquids; no overt difficulty with puddings.  He describes the liquids as causing more issues with coughing recently.  There is adequate oral seal and no signs of oral residue post swallow.  Recommend proceeding with MBS while Austin [name] is hospitalized to determine current oropharyngeal status.  He agrees with plan.   SLP Visit Diagnosis: Dysphagia, unspecified (R13.10)    Aspiration Risk       Diet Recommendation   NPO pending MBS       Other  Recommendations     Follow up Recommendations   TBA     Frequency and Duration            Prognosis        Swallow Study   General HPI: 64 y.o. 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 Admitted with sepsis, CAP, dehydration with AKI .  Pt noted to be coughing with liquids per nursing.  Austin [name] reports increased episodes of coughing lately with liquids - he  was planning to discuss these changes with his physician at upcoming appointment.  Type of Study: Bedside Swallow Evaluation Previous Swallow Assessment: not in EMR system Diet Prior to this Study: NPO Temperature Spikes Noted: No Respiratory Status: Room air History of Recent Intubation: No Behavior/Cognition: Alert;Cooperative;Pleasant mood Oral Cavity Assessment: Within Functional Limits Oral Care Completed by SLP: No Oral Cavity - Dentition: Dentures, top Vision: Functional for self-feeding Self-Feeding Abilities: Needs assist Patient Positioning: Upright in bed Baseline Vocal Quality: Aphonic Volitional Cough: Weak Volitional Swallow: Able to elicit    Oral/Motor/Sensory Function Overall Oral Motor/Sensory Function: Moderate impairment Facial ROM: Suspected CN VII (facial) dysfunction;Reduced left Facial Symmetry: Suspected CN VII (facial) dysfunction;Abnormal symmetry left Lingual Symmetry: Abnormal symmetry left;Suspected CN XII (hypoglossal) dysfunction Lingual Strength: Reduced Velum: Suspected CN X (Vagus) dysfunction;Impaired left;Impaired right   Ice Chips Ice chips: Not tested   Thin Liquid Thin Liquid: Impaired Presentation: Straw Pharyngeal  Phase Impairments: Cough - Immediate    Nectar Thick Nectar Thick Liquid: Not tested  Honey Thick Honey Thick Liquid: Not tested   Puree Puree: Within functional limits   Solid     Solid: Not tested      [name] 10/19/2019,10:14 AM  Estill Bamberg L. Tivis Ringer, [place] Office number [phone] Pager [phone]

## 2019-10-19 NOTE — Progress Notes (Signed)
Writer @ bedside for PIV/midline placement per consult. Patient with moderate infiltration to LUE- encouraged primary care RN to ice and elevate. RUE with PIV patient reports is pain. Mildly edematous- encouraged primary RN to remove. Writer unable to place midline. Placed #22 gauge PIV in RFA. Per care RN, patient with hx of multiple PIV's and poor vasculature. Will have AM VAST RN evaluate patient for midline (may be challenging due to locations of current infiltration/swelling). Recommend communication/discussion with care team to determine most appropriate vascular access dependent on patient estimated length of stay and IV medications ordered. Please consult VAST with questions/concerns.

## 2019-10-19 NOTE — Progress Notes (Signed)
[facility]                                                                              Patient Demographics  Austin [name], is a 64 y.o. male, DOB - 1955-10-25, ZOX:[id number]  Admit date - 10/17/2019   Admitting Physician [name], MD  Outpatient Primary MD for the patient is [name], MD  Outpatient specialists:   LOS - 1  days   Medical records reviewed and are as summarized below:    Chief Complaint  Patient presents with  . Abdominal Pain       Brief summary   Austin [name] is a 64 y.o. male with a history of brainstem CVA with resultant quadriplegia, diverticular perforation s/p colostomy, pituitary macroadenoma s/p resection, and renal cell carcinoma s/p nephrectomy now with metastases including left arm mass, HTN, hypothyroidism, and stage IIIa CKD. He presented to the ED with abdominal cramping, increased ostomy output, shortness of breath, poor per oral intake. In the ED he was febrile with leukocytosis, AKI suggestive of dehydration, with CT showing widespread metastatic disease with bibasilar consolidation most compatible with pneumonia. IV fluids and antibiotics were started and the patient admitted.   Assessment & Plan    Principal Problem:   Sepsis due to pneumonia [facility]) -Patient met sepsis criteria at the time of admission with fevers, leukocytosis, tachycardia, source likely due to pneumonia -Patient was placed on IV Zithromax, ceftriaxone -Procalcitonin improving, follow cultures -SLP evaluation, overnight had aspiration issues with liquids   Acute kidney injury on CKD stage IIIa, with dehydration -Continue IV fluid hydration, currently on Ringer lactate -Baseline creatinine 1.1-1.2, presented with creatinine of 2.19 -Improving to 1.64 today, follow volume status closely  Neurogenic bladder, quadriplegia status post brainstem CVA -Continue condom catheter, no acute issues  Metastatic renal cell cancer -Widespread  disease demonstrated on imaging, continue XRT Outpatient follow-up with his oncologist, Dr. Alen Blew  Hyperkalemia, non-anion gap metabolic acidosis -Patient received Lokelma during hospitalization, ACE inhibitor on hold -Potassium improved to 4.1 today  Hypothyroidism -Continue Synthroid, TSH 1.9  History of pituitary adenoma: With hypotension responsive to resuscitation, doubt hormonally mediated. Cortisol adequate.   Insomnia:  - Continue trazodone  Colostomy:  -Wound care following  HTN:  -BP now trending up, restart Norvasc, hydralazine IV as needed with parameters - will continue to hold lisinopril, HCTZ   Depression:  - Continue SSRI  Code Status: Full CODE STATUS DVT Prophylaxis: Lovenox Family Communication: Discussed all imaging results, lab results, explained to the patient   Disposition Plan: Patient from home, anticipated discharge to home.  SLP evaluation pending.  Hopefully DC home in 24 to 48 hours, once cleared and tolerating diet   Time Spent in minutes   65mns   Procedures:  None  Consultants:   None  Antimicrobials:   Anti-infectives (From admission, onward)   Start     Dose/Rate Route Frequency Ordered Stop   10/18/19 0000  cefTRIAXone (ROCEPHIN) 2 g in sodium chloride 0.9 % 100 mL IVPB     2 g 200 mL/hr over 30 Minutes Intravenous Daily at bedtime 10/17/19 2350 10/22/19 2159  10/18/19 0000  azithromycin (ZITHROMAX) 500 mg in sodium chloride 0.9 % 250 mL IVPB     500 mg 250 mL/hr over 60 Minutes Intravenous Daily at bedtime 10/17/19 2350 10/22/19 2159          Medications  Scheduled Meds: . enoxaparin (LOVENOX) injection  40 mg Subcutaneous Q24H  . escitalopram  10 mg Oral q morning - 10a  . levothyroxine  25 mcg Oral Q0600  . mouth rinse  15 mL Mouth Rinse BID  . traZODone  100 mg Oral QHS   Continuous Infusions: . azithromycin Stopped (10/18/19 2342)  . cefTRIAXone (ROCEPHIN)  IV Stopped (10/18/19 2315)  . lactated  ringers 75 mL/hr at 10/19/19 1322   PRN Meds:.acetaminophen **OR** acetaminophen, chlorpheniramine-HYDROcodone, guaiFENesin-dextromethorphan, HYDROcodone-acetaminophen, ondansetron **OR** ondansetron (ZOFRAN) IV, traMADol      Subjective:   Austin [name] was seen and examined today.  Overnight and had aspiration issues with liquids.  No fevers or chills.  At the time of my examination, was eating with assistance.  Patient denies dizziness, chest pain, shortness of breath, abdominal pain, N/V/D/C  Objective:   Vitals:   10/19/19 0541 10/19/19 0758 10/19/19 1355 10/19/19 1607  BP: 129/68 138/76 (!) 163/76 (!) 169/95  Pulse: 91 90 82 79  Resp: '19 18 20 20  ' Temp: 98.5 F (36.9 C) 98.3 F (36.8 C) (!) 97.4 F (36.3 C) 97.7 F (36.5 C)  TempSrc: Oral Oral Oral Oral  SpO2: 93% 96% 100% 98%  Weight:      Height:        Intake/Output Summary (Last 24 hours) at 10/19/2019 1633 Last data filed at 10/19/2019 1625 Gross per 24 hour  Intake 1718 ml  Output 1550 ml  Net 168 ml     Wt Readings from Last 3 Encounters:  10/17/19 81.6 kg  01/12/18 77.1 kg  09/11/17 77.1 kg     Exam  General: Alert and oriented, NAD  Cardiovascular: S1 S2 auscultated, no murmurs, RRR  Respiratory: Decreased breath sound at the bases  Gastrointestinal: Soft, nontender, nondistended, + bowel sounds  Ext: no pedal edema bilaterally  Neuro: Quadriplegia  Musculoskeletal: No digital cyanosis, clubbing, left upper anterior arm with irregular large nodular mass  Skin: No rashes  Psych: Normal affect and demeanor, alert and oriented x3    Data Reviewed:  I have personally reviewed following labs and imaging studies  Micro Results Recent Results (from the past 240 hour(s))  Culture, blood (Routine x 2)     Status: None (Preliminary result)   Collection Time: 10/17/19  8:03 PM   Specimen: BLOOD  Result Value Ref Range Status   Specimen Description   Final    BLOOD LEFT  ANTECUBITAL Performed at [facility], [address], [place], [place] [address]    Special Requests   Final    BOTTLES DRAWN AEROBIC AND ANAEROBIC Blood Culture adequate volume Performed at [facility] [address], [place], [place] [address]    Culture   Final    NO GROWTH 2 DAYS Performed at [facility] [address], [place], [place] [address]    Report Status PENDING  Incomplete  Culture, blood (Routine x 2)     Status: None (Preliminary result)   Collection Time: 10/17/19  8:41 PM   Specimen: BLOOD RIGHT FOREARM  Result Value Ref Range Status   Specimen Description BLOOD RIGHT FOREARM  Final   Special Requests   Final    BOTTLES DRAWN  AEROBIC AND ANAEROBIC Blood Culture results may not be optimal due to an excessive volume of blood received in culture bottles Performed at [facility] [address] [place], [place] [address]    Culture   Final    NO GROWTH 2 DAYS Performed at [facility] [address], [place], [place] [address]    Report Status PENDING  Incomplete  SARS CORONAVIRUS 2 (TAT 6-24 HRS) Nasopharyngeal Nasopharyngeal Swab     Status: None   Collection Time: 10/17/19 11:02 PM   Specimen: Nasopharyngeal Swab  Result Value Ref Range Status   SARS Coronavirus 2 NEGATIVE NEGATIVE Final    Comment: (NOTE) SARS-CoV-2 target nucleic acids are NOT DETECTED. The SARS-CoV-2 RNA is generally detectable in upper and lower respiratory specimens during the acute phase of infection. Negative results do not preclude SARS-CoV-2 infection, do not rule out co-infections with other pathogens, and should not be used as the sole basis for treatment or other patient management decisions. Negative results must be combined with clinical observations, patient history, and epidemiological information. The expected result is Negative. Fact Sheet for  Patients: SugarRoll.be Fact Sheet for Healthcare Providers: https://www.woods-mathews.com/ This test is not yet approved or cleared by the [place] FDA and  has been authorized for detection and/or diagnosis of SARS-CoV-2 by FDA under an Emergency Use Authorization (EUA). This EUA will remain  in effect (meaning this test can be used) for the duration of the COVID-19 declaration under Section 56 4(b)(1) of the Act, 21 U.S.C. section 360bbb-3(b)(1), unless the authorization is terminated or revoked sooner. Performed at [facility], [place] [address], [place], [place] [address]     Radiology Reports CT ABDOMEN PELVIS WO CONTRAST  Result Date: 10/17/2019 CLINICAL DATA:  Nausea vomiting and diarrhea with abdominal pain since yesterday. Metastatic renal cancer. Clinical concern for an abdominal abscess. EXAM: CT ABDOMEN AND PELVIS WITHOUT CONTRAST TECHNIQUE: Multidetector CT imaging of the abdomen and pelvis was performed following the standard protocol without IV contrast. Automatic exposure control utilized. COMPARISON:  February 08, 2019. FINDINGS: Lower chest: Bibasilar nonspecific bronchitis and patchy consolidation that is probably infectious. Probable trace bilateral pleural fluid. Metastatic appearing bilateral pulmonary nodules including a 9 mm next right basilar nodule on series 3, image 14 and 16 mm left basilar next nodule on image 19. Normal heart size. Mild mitral annulus calcification. Trace physiologic amount of pericardial fluid. Hepatobiliary: Cholecystectomy. Normal hepatic size. Normal biliary tree. Metastatic hepatic disease is not excluded on the current unenhanced images. Trace intraperitoneal free fluid, probably metastatic. Pancreas: Mild atrophy. No acute pancreatitis. Air in the distal common bile duct and proximal main pancreatic duct, suggesting remote sphincterotomy. Spleen: Normally sized spleen with benign granulomatous  calcification. Adrenals/Urinary Tract: Abnormal left adrenal gland thickening and nodularity with contiguous peripherally calcified lobular soft tissue with internal cystic necrosis in the left nephrectomy bed, concerning for recurrence malignancy or metastatic nodal disease with probable extension to the left adrenal gland. Small 10 mm right adrenal dense nodularity worrisome for a metastatic collision tumor given the adjacent 10 mm 6 Hounsfield unit adenoma. Normal size and cortical thickness of the right kidney. Moderate right perinephric inflammatory change. Partially characterized 11 mm dense right lower pole exophytic lesion. No right hydroureteronephrosis or obstructing calculus. Abnormal wall thickening of the urinary bladder with perivesicular inflammatory change. Stomach/Bowel: No bowel obstruction. Mild diverticulosis without acute diverticulitis. Left upper abdominal quadrant colostomy. Neither a normal nor abnormal appendix is apparent, and may be obscured  or invaded by the below-described soft tissue mass. Vascular/Lymphatic: Bulky metastatic appearing lymph nodes in the right abdomen and pelvis and right iliac chain, including a malignant or metastatic 7 cm transverse by 7 cm antero posterior by 13 cm craniocaudal lobulated soft tissue mass in the right flank with invasion into the right lateral abdominal wall musculature. Thoracoabdominal calcified atherosclerosis without an abdominal aortic aneurysm. Reproductive: Mild prostatomegaly with adjacent fat stranding. Other: Bilateral nonspecific inguinal lymph nodes. Bilateral gynecomastia. Musculoskeletal: Respiratory motion degrades evaluation of the ribcage. No obvious rib erosion, but possible costochondral invasion by the adjacent right abdominal soft tissue mass. Diffuse bone demineralization. Dense sclerotic lesions in the right S1 sacrum, left acetabulum bilateral femoral heads and ischial tuberosities and L3 vertebral body with similar  distributions but a couple slightly increased in size is compared to the remote February 27, 2012 CT imaging IMPRESSION: Bulky metastatic adenopathy including a 7 x 7 x 13 cm metastatic soft tissue mass with abdominal wall and possible costochondral or appendiceal invasion along the right flank. No bowel obstruction. Bibasilar consolidation that is probably infectious. Metastatic-appearing nodules in both lower lungs. Imaging findings that would support a clinical diagnosis of a nonspecific cystitis/prostatitis. Left nephrectomy with abnormal soft tissue in the nephrectomy bed and dense soft tissue nodularity of both adrenal glands concerning for metastatic disease. Partially characterized 10 mm right renal lower pole dense exophytic lesion, possibly benign or malignant or metastatic. Thoracoabdominal aortic calcified atherosclerosis. Chronic pelvic sclerotic lesions, some slightly increased in size since 2013. Electronically Signed   By: Revonda Humphrey   On: 10/17/2019 22:43   DG Chest 2 View  Result Date: 10/17/2019 CLINICAL DATA:  Shortness of breath with nausea and vomiting EXAM: CHEST - 2 VIEW COMPARISON:  CT from 03/10/2019 FINDINGS: Cardiac shadows within normal limits. Aortic calcifications are seen. The lungs are well aerated bilaterally. Small nodular changes are noted similar to that seen on prior CT. No focal infiltrate is noted. Mild bibasilar atelectatic changes are seen. No effusion is noted. IMPRESSION: Small nodules bilaterally with bibasilar atelectasis. Electronically Signed   By: Inez Catalina M.D.   On: 10/17/2019 21:19   MR HUMERUS LEFT W WO CONTRAST  Result Date: 10/07/2019 CLINICAL DATA:  History of renal cell carcinoma with new palpable mass in the upper left arm. EXAM: MRI OF THE LEFT HUMERUS WITHOUT AND WITH CONTRAST TECHNIQUE: Multiplanar, multisequence MR imaging of the left upper extremity was performed before and after the administration of intravenous contrast. CONTRAST:  58m  GADAVIST GADOBUTROL 1 MMOL/ML IV SOLN COMPARISON:  None. FINDINGS: There is a large rounded soft tissue mass measuring 3.7 x 3.6 cm in the anterior aspect of the left upper arm. This has intermediate T1 signal intensity, increased T2 signal intensity and moderate contrast enhancement. Findings highly suspicious for a metastatic focus given his history of metastatic renal cell carcinoma. The mass begins at the level of the deltoid attachment on the humerus and is anterior to the upper aspect of the biceps muscle. No involvement of the humerus is identified. No direct invasion of the biceps or deltoid muscles. No axillary lymphadenopathy is identified. No other subcutaneous lesions are identified involving the left upper extremity or left chest wall. IMPRESSION: 3.7 x 3.6 cm enhancing soft tissue mass in the anterior aspect of the left upper arm. This is highly suspicious for a metastatic focus given history of metastatic renal cell carcinoma. Electronically Signed   By: PMarijo SanesM.D.   On: 10/07/2019 13:19  Lab Data:  CBC: Recent Labs  Lab 10/17/19 2000 10/18/19 0432 10/19/19 0631  WBC 10.6* 11.9* 10.6*  NEUTROABS 9.5*  --   --   HGB 13.2 11.5* 11.1*  HCT 41.3 36.3* 35.2*  MCV 92.4 92.8 94.1  PLT 263 209 327   Basic Metabolic Panel: Recent Labs  Lab 10/17/19 2000 10/18/19 0432 10/19/19 0631  NA 137 138 142  K 5.4* 5.4* 4.1  CL 109 113* 116*  CO2 15* 16* 17*  GLUCOSE 118* 109* 114*  BUN 67* 65* 53*  CREATININE 2.19* 2.08* 1.64*  CALCIUM 8.5* 7.5* 7.5*  MG  --  2.1  --   PHOS  --  3.1  --    GFR: Estimated Creatinine Clearance: 49.3 mL/min (A) (by C-G formula based on SCr of 1.64 mg/dL (H)). Liver Function Tests: Recent Labs  Lab 10/17/19 2000 10/18/19 0432  AST 14* 15  ALT 21 18  ALKPHOS 53 46  BILITOT 0.6 0.5  PROT 7.4 6.8  ALBUMIN 3.5 3.1*   No results for input(s): LIPASE, AMYLASE in the last 168 hours. No results for input(s): AMMONIA in the last 168  hours. Coagulation Profile: Recent Labs  Lab 10/17/19 2000  INR 1.1   Cardiac Enzymes: No results for input(s): CKTOTAL, CKMB, CKMBINDEX, TROPONINI in the last 168 hours. BNP (last 3 results) No results for input(s): PROBNP in the last 8760 hours. HbA1C: No results for input(s): HGBA1C in the last 72 hours. CBG: No results for input(s): GLUCAP in the last 168 hours. Lipid Profile: No results for input(s): CHOL, HDL, LDLCALC, TRIG, CHOLHDL, LDLDIRECT in the last 72 hours. Thyroid Function Tests: Recent Labs    10/18/19 0432  TSH 1.990   Anemia Panel: Recent Labs    10/17/19 2000  FERRITIN 106   Urine analysis:    Component Value Date/Time   COLORURINE YELLOW 10/17/2019 2028   APPEARANCEUR CLEAR 10/17/2019 2028   LABSPEC 1.016 10/17/2019 2028   PHURINE 5.0 10/17/2019 2028   GLUCOSEU NEGATIVE 10/17/2019 2028   HGBUR SMALL (A) 10/17/2019 2028   HGBUR negative 02/01/2010 1022   BILIRUBINUR NEGATIVE 10/17/2019 2028   BILIRUBINUR Negative 10/13/2018 1014   KETONESUR NEGATIVE 10/17/2019 2028   PROTEINUR NEGATIVE 10/17/2019 2028   UROBILINOGEN 0.2 10/13/2018 1014   UROBILINOGEN 0.2 02/01/2010 1022   NITRITE NEGATIVE 10/17/2019 2028   LEUKOCYTESUR TRACE (A) 10/17/2019 2028     Emmitte Surgeon M.D. [facility] 10/19/2019, 4:33 PM   Call night coverage person covering after 7pm

## 2019-10-19 NOTE — Progress Notes (Signed)
This RN notified  NP M. Sharlet Salina of pts left iv infiltration, pain to that left arm. Also notified NP of patients sinus tachycardia   and 6 beats of SVT. Orders received

## 2019-10-20 LAB — BASIC METABOLIC PANEL
Anion gap: 8 (ref 5–15)
BUN: 44 mg/dL — ABNORMAL HIGH (ref 8–23)
CO2: 17 mmol/L — ABNORMAL LOW (ref 22–32)
Calcium: 7.8 mg/dL — ABNORMAL LOW (ref 8.9–10.3)
Chloride: 119 mmol/L — ABNORMAL HIGH (ref 98–111)
Creatinine, Ser: 1.35 mg/dL — ABNORMAL HIGH (ref 0.61–1.24)
GFR calc Af Amer: >60 mL/min (ref >60)
GFR calc non Af Amer: 55 mL/min — ABNORMAL LOW (ref >60)
Glucose, Bld: 101 mg/dL — ABNORMAL HIGH (ref 70–99)
Potassium: 3.6 mmol/L (ref 3.5–5.1)
Sodium: 144 mmol/L (ref 135–145)

## 2019-10-20 LAB — CBC
HCT: 33.8 % — ABNORMAL LOW (ref 39.0–52.0)
Hemoglobin: 10.5 g/dL — ABNORMAL LOW (ref 13.0–17.0)
MCH: 29.2 pg (ref 26.0–34.0)
MCHC: 31.1 g/dL (ref 30.0–36.0)
MCV: 94.2 fL (ref 80.0–100.0)
Platelets: 194 10*3/uL (ref 150–400)
RBC: 3.59 MIL/uL — ABNORMAL LOW (ref 4.22–5.81)
RDW: 16.3 % — ABNORMAL HIGH (ref 11.5–15.5)
WBC: 11.6 10*3/uL — ABNORMAL HIGH (ref 4.0–10.5)
nRBC: 0 % (ref 0.0–0.2)

## 2019-10-20 LAB — LEGIONELLA PNEUMOPHILA SEROGP 1 UR AG: L. pneumophila Serogp 1 Ur Ag: NEGATIVE

## 2019-10-20 MED ORDER — LISINOPRIL-HYDROCHLOROTHIAZIDE 20-12.5 MG PO TABS: 1.0000 | ORAL_TABLET | Freq: Every day | ORAL | 11 refills | Status: DC

## 2019-10-20 MED ORDER — CEFPODOXIME PROXETIL 100 MG PO TABS: 100.0000 mg | ORAL_TABLET | Freq: Two times a day (BID) | ORAL | 0 refills | Status: AC

## 2019-10-20 MED ORDER — GUAIFENESIN-DM 100-10 MG/5ML PO SYRP: 10.0000 mL | ORAL_SOLUTION | ORAL | 0 refills | Status: DC | PRN

## 2019-10-20 MED ORDER — AZITHROMYCIN 500 MG PO TABS: 500.0000 mg | ORAL_TABLET | Freq: Every day | ORAL | 0 refills | Status: AC

## 2019-10-20 MED ORDER — MUCINEX 600 MG PO TB12: 600.0000 mg | ORAL_TABLET | Freq: Two times a day (BID) | ORAL | 0 refills | Status: AC

## 2019-10-20 MED ORDER — FUROSEMIDE 20 MG PO TABS: 20.0000 mg | ORAL_TABLET | Freq: Every day | ORAL | Status: DC | PRN

## 2019-10-20 NOTE — Progress Notes (Signed)
LATE ENTRY FROM 10/19/19   Modified Barium Swallow Progress Note  Patient Details  Name: Austin [name] MRN: [id number] Date of Birth: 11/11/1955  Today's Date: 10/20/2019  Modified Barium Swallow completed.  Full report located under Chart Review in the Imaging Section.  Brief recommendations include the following:  Clinical Impression  Pt presents with a mild pharyngeal dysphagia with inconsistent trace aspiration of thin liquids before and during the swallow response.  Oral control is functional, and there is adequate pharyngeal squeeze to propel materials into UES.  There is trace residual of barium in the vallecular space with no accumulation.  When pt tucks his chin, it improves timing of laryngeal vestibule closure and facilitates airway protection, preventing aspiration.  Recommend resuming a regular diet, thin liquids with chin tuck.  Reviewed results/recs and images later in pt's room with him and his wife.  SLP will f/u briefly for safety/education.      Swallow Evaluation Recommendations  SLP Diet Recommendations Regular solids;Thin liquid  Liquid Administration via Straw;Cup  Medication Administration Whole meds with puree  Compensations Chin tuck                        Cloie Wooden L. Tivis Ringer, [place] Office number [phone] Pager [phone]                [name] 10/20/2019,8:35 AM

## 2019-10-20 NOTE — Progress Notes (Signed)
  Speech Language Pathology Treatment: Dysphagia  Patient Details Name: Austin [name] MRN: [id number] DOB: Sep 10, 1955 Today's Date: 10/20/2019 Time: 0951-1000 SLP Time Calculation (min) (ACUTE ONLY): 9 min  Assessment / Plan / Recommendation Clinical Impression  F/u after yesterday's MBS.  Pt sitting upright in bed, being assisted by NT with breakfast.  Reviewed MBS results.  He is able to execute chin tuck with liquids. Required initial cues to maintain chin tuck through duration of swallow and briefly after - if he lifts head too soon, he begins to cough.  Increased duration of chin tuck posture eliminated coughing, likely allowing time for liquids to pass beyond laryngeal vestibule and through UES. Pt verbalized understanding and demonstrated carry-over.  We discussed the likely temporary need to use this compensatory strategy.  Fortunately, his sensory system allows him to know when he has aspirated - as he improves from this acute medical event, his swallow should improve to baseline. Encouraged him to continue chin tuck for next several days, then begin to try swallowing liquids in neutral position to assess whether he coughs or not. Pt agrees.  No further SLP f/u is needed - pt is scheduled for D/C this afternoon.     HPI HPI: 64 y.o. 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 Admitted with sepsis, CAP, dehydration with AKI .  Pt noted to be coughing with liquids per nursing.  Austin [name] reports increased episodes of coughing lately with liquids - he  was planning to discuss these changes with his physician at upcoming appointment.       SLP Plan  All goals met       Recommendations  Diet recommendations: Regular;Thin liquid Liquids provided via: Straw Compensations: Chin tuck                Oral Care Recommendations: Oral care BID Follow up Recommendations: None SLP Visit Diagnosis: Dysphagia, pharyngeal phase (R13.13) Plan: All goals met       GO              Austin Sleep L. Austin [name], Austin [name] CCC/SLP Acute Rehabilitation Services Office number [phone] Pager [phone]   Austin [name] Austin [name] 10/20/2019, 10:03 AM

## 2019-10-20 NOTE — Progress Notes (Signed)
Pt is being discharged home with spouse. Discharge instructions including medications and follow up appointments given. Pt and spouse had no further questions at this time.

## 2019-10-20 NOTE — Discharge Summary (Signed)
Physician Discharge Summary   Patient ID: Austin [name] MRN: [id number] DOB/AGE: 09/30/1955 64 y.o.  Admit date: 10/17/2019 Discharge date: 10/20/2019  Primary Care Physician:  [name], MD   Recommendations for Outpatient Follow-up:  1. Follow up with PCP in 1-2 weeks 2. Continue Zithromax, Vantin for 4 more days to complete full course 3. Patient recommended regular diet thin liquids via straw, chin tuck for aspiration precautions  Home Health: At baseline Equipment/Devices:   Discharge Condition: stable  CODE STATUS: FULL Diet recommendation: Regular thin liquids   Discharge Diagnoses:   . CAP (community acquired pneumonia) . Dehydration . Sepsis due to pneumonia ([place]) . Essential hypertension . Neurogenic bladder . Renal carcinoma metastatic with carcinomatosis . Hyperlipidemia . 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 AKI (acute kidney injury) ([place]) on CKD stage III . Hypothyroid . 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 Penicillins Rash and Other (See Comments)    Has patient had a PCN reaction causing immediate rash, facial/tongue/throat swelling, SOB or lightheadedness with hypotension: YES Has patient had a PCN reaction causing severe rash involving mucus membranes or skin necrosis: No Has patient had a PCN reaction that required hospitalization No Has patient had a PCN reaction occurring within the last 10 years: Yes  If all of the above answers are "NO", then may proceed with Cephalosporin use.     DISCHARGE MEDICATIONS: Allergies as of 10/20/2019      Reactions   Penicillins Rash, Other (See Comments)   Has patient had a PCN reaction causing immediate rash, facial/tongue/throat swelling, SOB or lightheadedness with hypotension: YES Has patient had a PCN reaction causing severe rash involving mucus membranes or skin necrosis: No Has patient had a PCN reaction that  required hospitalization No Has patient had a PCN reaction occurring within the last 10 years: Yes  If all of the above answers are "NO", then may proceed with Cephalosporin use.      Medication List    TAKE these medications   acetaminophen 500 MG tablet Commonly known as: TYLENOL Take 1 tablet (500 mg total) by mouth every 6 (six) hours as needed for mild pain, moderate pain, fever or headache.   amLODipine 10 MG tablet Commonly known as: NORVASC Take 1 tablet (10 mg total) by mouth daily.   azithromycin 500 MG tablet Commonly known as: Zithromax Take 1 tablet (500 mg total) by mouth daily for 4 days.   BD Disp Needles 18G X 1-1/2" Misc Generic drug: NEEDLE (DISP) 18 G USE TO INJECT TESTOSTERONE INTO THE MUSCLE AS DIRECTED EVERY 14 DAYS   cefpodoxime 100 MG tablet Commonly known as: VANTIN Take 1 tablet (100 mg total) by mouth 2 (two) times daily for 4 days.   escitalopram 10 MG tablet Commonly known as: LEXAPRO TAKE 1 TABLET BY MOUTH IN THE MORNING   furosemide 20 MG tablet Commonly known as: LASIX Take 1 tablet (20 mg total) by mouth daily as needed for fluid or edema.   guaiFENesin-dextromethorphan 100-10 MG/5ML syrup Commonly known as: ROBITUSSIN DM Take 10 mLs by mouth every 4 (four) hours as needed for cough.   ketoconazole 2 % cream Commonly known as: NIZORAL APPLY AS NEEDED TO RASH What changed: See the new instructions.   levothyroxine 25 MCG tablet Commonly known as: SYNTHROID TAKE 1 TABLET BY MOUTH ONCE DAILY BEFORE BREAKFAST What changed: See the new instructions.   lisinopril-hydrochlorothiazide 20-12.5  MG tablet Commonly known as: Zestoretic Take 1 tablet by mouth daily. Start taking on: October 21, 2019   Melatonin 3 MG Tabs Take 3 mg by mouth at bedtime as needed (for sleep).   Mucinex 600 MG 12 hr tablet Generic drug: guaiFENesin Take 1 tablet (600 mg total) by mouth 2 (two) times daily.   multivitamin with minerals Tabs tablet Take 1  tablet by mouth daily with breakfast.   SAFETY-LOK 3CC SYR 22GX1.5" 22G X 1-1/2" 3 ML Misc Generic drug: SYRINGE-NEEDLE (DISP) 3 ML USE TO INJECT 1 ML OF TESTOSTERONE EVERY 14 DAYS   testosterone enanthate 200 MG/ML injection Commonly known as: DELATESTRYL INJECT 1 ML INTRAMUSCULARLY  EVERY TWO WEEKS   traMADol 50 MG tablet Commonly known as: ULTRAM TAKE 1 TABLET BY MOUTH EVERY 6 HOURS AS NEEDED What changed: reasons to take this   traZODone 100 MG tablet Commonly known as: DESYREL TAKE 1 TABLET BY MOUTH AT BEDTIME   triamcinolone cream 0.1 % Commonly known as: KENALOG Apply 1 application topically 2 (two) times daily as needed (for rash). Applies to face        Brief H and P: For complete details please refer to admission H and P, but in brief Austin [name] a64 y.o.malewith a history of brainstem CVA with resultant quadriplegia, diverticular perforation s/p colostomy, pituitary macroadenoma s/p resection, and renal cell carcinoma s/p nephrectomy now with metastases including left arm mass, HTN, hypothyroidism, and stage IIIa CKD. He presented to the ED with abdominal cramping, increased ostomy output, shortness of breath, poor per oral intake. In the ED he was febrile with leukocytosis, AKI suggestive of dehydration, with CT showing widespread metastatic disease with bibasilar consolidationmost compatible with pneumonia. IV fluids and antibiotics were started and the patient admitted.   Hospital Course:  Sepsis due to pneumonia [facility]) -Patient met sepsis criteria at the time of admission with fevers, leukocytosis, tachycardia, source likely due to pneumonia -Patient was placed on IV Zithromax, ceftriaxone, transition to oral Zithromax and Vantin for 4 more days to complete full course -Procalcitonin improving, blood cultures remain negative -Speech therapy was consulted for SLP evaluation, recommended regular diet with thin liquids with straw, chin tuck   Acute  kidney injury on CKD stage IIIa, with dehydration -Continue IV fluid hydration, currently on Ringer lactate -Baseline creatinine 1.1-1.2, presented with creatinine of 2.19 -Improved close to baseline, 1.3  Neurogenic bladder, quadriplegia status post brainstem CVA -Continue condom catheter, no acute issues  Metastatic renal cell cancer -Widespread disease demonstrated on imaging, continue XRT Outpatient follow-up with his oncologist, Dr. Alen Blew  Hyperkalemia, non-anion gap metabolic acidosis -Patient received Lokelma during hospitalization, ACE inhibitor was placed on hold during admission -Potassium improved to 4.1 today  Hypothyroidism -Continue Synthroid, TSH 1.9  History of pituitary adenoma:  With hypotension responsive to resuscitation, doubt hormonally mediated. Cortisol adequate.  Insomnia:  - Continue trazodone  Colostomy:  -Wound care following  HTN:  -BP now trending up, resumed all antihypertensives   Depression:  - Continue SSRI  Day of Discharge S: Doing better, no fevers or chills, no acute issues overnight, no aspiration issues.  Wants to go home.  BP (!) 151/65 (BP Location: Left Arm)   Pulse 98   Temp 98 F (36.7 C) (Oral)   Resp 18   Ht 6' (1.829 m)   Wt 81.6 kg   SpO2 95%   BMI 24.41 kg/m   Physical Exam: General: Alert and awake oriented x3 not in any acute distress.  HEENT: anicteric sclera, pupils reactive to light and accommodation CVS: S1-S2 clear no murmur rubs or gallops Chest: clear to auscultation bilaterally, no wheezing rales or rhonchi Abdomen: soft nontender, nondistended, normal bowel sounds Extremities: no cyanosis, clubbing or edema noted bilaterally Neuro: Quadriplegia    Get Medicines reviewed and adjusted: Please take all your medications with you for your next visit with your Primary MD  Please request your Primary MD to go over all hospital tests and procedure/radiological results at the follow up. Please  ask your Primary MD to get all Hospital records sent to his/her office.  If you experience worsening of your admission symptoms, develop shortness of breath, life threatening emergency, suicidal or homicidal thoughts you must seek medical attention immediately by calling 911 or calling your MD immediately  if symptoms less severe.  You must read complete instructions/literature along with all the possible adverse reactions/side effects for all the Medicines you take and that have been prescribed to you. Take any new Medicines after you have completely understood and accept all the possible adverse reactions/side effects.   Do not drive when taking pain medications.   Do not take more than prescribed Pain, Sleep and Anxiety Medications  Special Instructions: If you have smoked or chewed Tobacco  in the last 2 yrs please stop smoking, stop any regular Alcohol  and or any Recreational drug use.  Wear Seat belts while driving.  Please note  You were cared for by a hospitalist during your hospital stay. Once you are discharged, your primary care physician will handle any further medical issues. Please note that NO REFILLS for any discharge medications will be authorized once you are discharged, as it is imperative that you return to your primary care physician (or establish a relationship with a primary care physician if you do not have one) for your aftercare needs so that they can reassess your need for medications and monitor your lab values.   The results of significant diagnostics from this hospitalization (including imaging, microbiology, ancillary and laboratory) are listed below for reference.      Procedures/Studies:  CT ABDOMEN PELVIS WO CONTRAST  Result Date: 10/17/2019 CLINICAL DATA:  Nausea vomiting and diarrhea with abdominal pain since yesterday. Metastatic renal cancer. Clinical concern for an abdominal abscess. EXAM: CT ABDOMEN AND PELVIS WITHOUT CONTRAST TECHNIQUE:  Multidetector CT imaging of the abdomen and pelvis was performed following the standard protocol without IV contrast. Automatic exposure control utilized. COMPARISON:  February 08, 2019. FINDINGS: Lower chest: Bibasilar nonspecific bronchitis and patchy consolidation that is probably infectious. Probable trace bilateral pleural fluid. Metastatic appearing bilateral pulmonary nodules including a 9 mm next right basilar nodule on series 3, image 14 and 16 mm left basilar next nodule on image 19. Normal heart size. Mild mitral annulus calcification. Trace physiologic amount of pericardial fluid. Hepatobiliary: Cholecystectomy. Normal hepatic size. Normal biliary tree. Metastatic hepatic disease is not excluded on the current unenhanced images. Trace intraperitoneal free fluid, probably metastatic. Pancreas: Mild atrophy. No acute pancreatitis. Air in the distal common bile duct and proximal main pancreatic duct, suggesting remote sphincterotomy. Spleen: Normally sized spleen with benign granulomatous calcification. Adrenals/Urinary Tract: Abnormal left adrenal gland thickening and nodularity with contiguous peripherally calcified lobular soft tissue with internal cystic necrosis in the left nephrectomy bed, concerning for recurrence malignancy or metastatic nodal disease with probable extension to the left adrenal gland. Small 10 mm right adrenal dense nodularity worrisome for a metastatic collision tumor given the adjacent 10 mm 6 Hounsfield unit  adenoma. Normal size and cortical thickness of the right kidney. Moderate right perinephric inflammatory change. Partially characterized 11 mm dense right lower pole exophytic lesion. No right hydroureteronephrosis or obstructing calculus. Abnormal wall thickening of the urinary bladder with perivesicular inflammatory change. Stomach/Bowel: No bowel obstruction. Mild diverticulosis without acute diverticulitis. Left upper abdominal quadrant colostomy. Neither a normal nor  abnormal appendix is apparent, and may be obscured or invaded by the below-described soft tissue mass. Vascular/Lymphatic: Bulky metastatic appearing lymph nodes in the right abdomen and pelvis and right iliac chain, including a malignant or metastatic 7 cm transverse by 7 cm antero posterior by 13 cm craniocaudal lobulated soft tissue mass in the right flank with invasion into the right lateral abdominal wall musculature. Thoracoabdominal calcified atherosclerosis without an abdominal aortic aneurysm. Reproductive: Mild prostatomegaly with adjacent fat stranding. Other: Bilateral nonspecific inguinal lymph nodes. Bilateral gynecomastia. Musculoskeletal: Respiratory motion degrades evaluation of the ribcage. No obvious rib erosion, but possible costochondral invasion by the adjacent right abdominal soft tissue mass. Diffuse bone demineralization. Dense sclerotic lesions in the right S1 sacrum, left acetabulum bilateral femoral heads and ischial tuberosities and L3 vertebral body with similar distributions but a couple slightly increased in size is compared to the remote February 27, 2012 CT imaging IMPRESSION: Bulky metastatic adenopathy including a 7 x 7 x 13 cm metastatic soft tissue mass with abdominal wall and possible costochondral or appendiceal invasion along the right flank. No bowel obstruction. Bibasilar consolidation that is probably infectious. Metastatic-appearing nodules in both lower lungs. Imaging findings that would support a clinical diagnosis of a nonspecific cystitis/prostatitis. Left nephrectomy with abnormal soft tissue in the nephrectomy bed and dense soft tissue nodularity of both adrenal glands concerning for metastatic disease. Partially characterized 10 mm right renal lower pole dense exophytic lesion, possibly benign or malignant or metastatic. Thoracoabdominal aortic calcified atherosclerosis. Chronic pelvic sclerotic lesions, some slightly increased in size since 2013. Electronically Signed    By: Revonda Humphrey   On: 10/17/2019 22:43   DG Chest 2 View  Result Date: 10/17/2019 CLINICAL DATA:  Shortness of breath with nausea and vomiting EXAM: CHEST - 2 VIEW COMPARISON:  CT from 03/10/2019 FINDINGS: Cardiac shadows within normal limits. Aortic calcifications are seen. The lungs are well aerated bilaterally. Small nodular changes are noted similar to that seen on prior CT. No focal infiltrate is noted. Mild bibasilar atelectatic changes are seen. No effusion is noted. IMPRESSION: Small nodules bilaterally with bibasilar atelectasis. Electronically Signed   By: Inez Catalina M.D.   On: 10/17/2019 21:19   MR HUMERUS LEFT W WO CONTRAST  Result Date: 10/07/2019 CLINICAL DATA:  History of renal cell carcinoma with new palpable mass in the upper left arm. EXAM: MRI OF THE LEFT HUMERUS WITHOUT AND WITH CONTRAST TECHNIQUE: Multiplanar, multisequence MR imaging of the left upper extremity was performed before and after the administration of intravenous contrast. CONTRAST:  12m GADAVIST GADOBUTROL 1 MMOL/ML IV SOLN COMPARISON:  None. FINDINGS: There is a large rounded soft tissue mass measuring 3.7 x 3.6 cm in the anterior aspect of the left upper arm. This has intermediate T1 signal intensity, increased T2 signal intensity and moderate contrast enhancement. Findings highly suspicious for a metastatic focus given his history of metastatic renal cell carcinoma. The mass begins at the level of the deltoid attachment on the humerus and is anterior to the upper aspect of the biceps muscle. No involvement of the humerus is identified. No direct invasion of the biceps or deltoid muscles. No  axillary lymphadenopathy is identified. No other subcutaneous lesions are identified involving the left upper extremity or left chest wall. IMPRESSION: 3.7 x 3.6 cm enhancing soft tissue mass in the anterior aspect of the left upper arm. This is highly suspicious for a metastatic focus given history of metastatic renal cell  carcinoma. Electronically Signed   By: Marijo Sanes M.D.   On: 10/07/2019 13:19   DG Swallowing Func-Speech Pathology  Result Date: 10/19/2019 Objective Swallowing Evaluation: Type of Study: MBS-Modified Barium Swallow Study  Patient Details Name: Austin [name] MRN: [id number] Date of Birth: 07-Feb-1956 Today's Date: 10/19/2019 Time: SLP Start Time (ACUTE ONLY): 1200 -SLP Stop Time (ACUTE ONLY): 1225 SLP Time Calculation (min) (ACUTE ONLY): 25 min Past Medical History: Past Medical History: Diagnosis Date . Brainstem stroke ([place]) 10/16/2008  Qualifier: Diagnosis of  By: [name] LPN, [name]   . C. difficile colitis 02/08/2017 . Cerebrovascular accident ([place]) 1994  hx of brainstem stroke, residual diminished lung capacity . Chronic kidney disease   Left Renal Mass . 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 Diverticulitis  . Duodenal ulcer  . GIB (gastrointestinal bleeding) 05/28/2017 . Hx of colonic polyps  . Hypertension   has been off BP meds since GI bleed was thought to have been caused by previously prescribed lisinopril  . Hypogonadism  . Intra-abdominal abscess ([place]) 01/23/2017 . Iron deficiency anemia  . met renal ca to peritoneal and retroperitoneal dx'd 12/2011  lt nephrectomy . Neuromuscular disorder ([place])   quadraplegic . Open abdominal incision with drainage 01/26/2017 . Pituitary adenoma ([place])  . Pituitary macroadenoma ([place])   progression into right cavernous sinus . Pneumonia  . PONV (postoperative nausea and vomiting)   vomited after colostomy placement  . Pre-diabetes   last a1c 5.6  . 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[place])  . Urinary incontinence  . Venous stasis   edema Past Surgical History: Past Surgical History: Procedure Laterality Date . CHOLECYSTECTOMY   . COLON RESECTION N/A 05/04/2017  Procedure: LAPAROSCOPIC SIGMOID COLON RESECTION WITH END COLSOTOMY ERAS PATHWAY;  Surgeon: Alphonsa Overall, MD;  Location: [facility];  Service: General;  Laterality: N/A; . COLONOSCOPY  02/27/2012  Procedure: COLONOSCOPY;  Surgeon: Jerene Bears, MD;   Location: [facility];  Service: Gastroenterology;  Laterality: N/A; . COLONOSCOPY N/A 05/31/2017  Procedure: COLONOSCOPY;  Surgeon: Milus Banister, MD;  Location: [facility];  Service: Endoscopy;  Laterality: N/A; . COLOSTOMY TAKEDOWN N/A 08/20/2017  Procedure: LAPAROSCOPIC ASSISTED REVISION END COLOSTOMY AND ENTEROLYSIS OF ADHESIONS;  Surgeon: Alphonsa Overall, MD;  Location: [facility];  Service: General;  Laterality: N/A;  ERAS PATHWAY . ESOPHAGOGASTRODUODENOSCOPY N/A 05/31/2017  Procedure: ESOPHAGOGASTRODUODENOSCOPY (EGD);  Surgeon: Milus Banister, MD;  Location: [facility];  Service: Endoscopy;  Laterality: N/A; . gamma knife radiation surgery  05/2010  for pituitary . IR RADIOLOGIST EVAL & MGMT  02/12/2017 . IR RADIOLOGIST EVAL & MGMT  03/17/2017 . IR RADIOLOGIST EVAL & MGMT  04/14/2017 . IR RADIOLOGIST EVAL & MGMT  02/05/2017 . PITUITARY SURGERY  2007  nose approach . ROBOT ASSISTED LAPAROSCOPIC NEPHRECTOMY  04/23/2012  Procedure: ROBOTIC ASSISTED LAPAROSCOPIC NEPHRECTOMY;  Surgeon: Alexis Frock, MD;  Location: [facility];  Service: Urology;  Laterality: Left;  radical . stomach peg  02/1993  removed 5-6 years later . TRACHEOSTOMY TUBE PLACEMENT  02/1993  removed 04/1993 . UMBILICAL HERNIA REPAIR  04/23/2012  Procedure: HERNIA REPAIR UMBILICAL ADULT;  Surgeon: Alexis Frock, MD;  Location: [facility];  Service: Urology;; . Austin [name] . vocal cord surgery    injected  with collagen, then fat from stomach to improve speech s/p stroke HPI: 64 y.o. 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 Admitted with sepsis, CAP, dehydration with AKI .  Pt noted to be coughing with liquids per nursing.  Austin [name] reports increased episodes of coughing lately with liquids - he was planning to discuss these changes with his physician at upcoming appointment.  Subjective: alert, cooperative Assessment / Plan / Recommendation CHL IP CLINICAL IMPRESSIONS 10/19/2019 Clinical Impression Pt present with a mild pharyngeal dysphagia with inconsistent trace aspiration of thin liquids before and during the swallow response.  Oral control is functional, and there is adequate pharyngeal squeeze to propel materials into UES.  There is trace residual of barium in the vallecular space with no accumulation.  When pt tucks his chin, it improves timing of laryngeal vestibule closure and facilitates airway protection, preventing aspiration.  Recommend resuming a regular diet, thin liquids with chin tuck.  Reviewed results/recs and images later in pt's room with him and his wife.  SLP will f/u briefly for safety/education. SLP Visit Diagnosis Dysphagia, pharyngeal phase (R13.13) Attention and concentration deficit following -- Frontal lobe and executive function deficit following -- Impact on safety and function Mild aspiration risk   CHL IP TREATMENT RECOMMENDATION 10/19/2019 Treatment Recommendations Therapy as outlined in treatment plan below   No flowsheet data found. CHL IP DIET RECOMMENDATION 10/19/2019 SLP Diet Recommendations Regular solids;Thin liquid Liquid Administration via Straw;Cup Medication Administration Whole meds with puree Compensations Chin tuck Postural Changes --   CHL IP OTHER RECOMMENDATIONS 10/19/2019 Recommended Consults -- Oral Care Recommendations Oral care BID Other Recommendations --   CHL IP FOLLOW UP RECOMMENDATIONS 10/19/2019 Follow up Recommendations None   CHL IP FREQUENCY AND DURATION 10/19/2019 Speech Therapy Frequency (ACUTE ONLY) min 1 x/week Treatment Duration 1 week      CHL IP ORAL  PHASE 10/19/2019 Oral Phase WFL Oral - Pudding Teaspoon -- Oral - Pudding Cup -- Oral - Honey Teaspoon -- Oral - Honey Cup -- Oral - Nectar Teaspoon -- Oral - Nectar Cup -- Oral - Nectar Straw -- Oral - Thin Teaspoon -- Oral - Thin Cup -- Oral - Thin Straw -- Oral - Puree -- Oral - Mech Soft -- Oral - Regular -- Oral - Multi-Consistency -- Oral - Pill -- Oral Phase - Comment --  CHL IP PHARYNGEAL PHASE 10/19/2019 Pharyngeal Phase Impaired Pharyngeal- Pudding Teaspoon -- Pharyngeal -- Pharyngeal- Pudding Cup -- Pharyngeal -- Pharyngeal- Honey Teaspoon -- Pharyngeal -- Pharyngeal- Honey Cup -- Pharyngeal -- Pharyngeal- Nectar Teaspoon -- Pharyngeal -- Pharyngeal- Nectar Cup -- Pharyngeal -- Pharyngeal- Nectar Straw Delayed swallow initiation-vallecula;Pharyngeal residue - valleculae Pharyngeal -- Pharyngeal- Thin Teaspoon -- Pharyngeal -- Pharyngeal- Thin Cup -- Pharyngeal -- Pharyngeal- Thin Straw Reduced airway/laryngeal closure;Penetration/Aspiration before swallow;Penetration/Aspiration during swallow;Trace aspiration;Pharyngeal residue - valleculae Pharyngeal Material enters airway, passes BELOW cords without attempt by patient to eject out (silent aspiration);Material enters airway, passes BELOW cords and not ejected out despite cough attempt by patient Pharyngeal- Puree Delayed swallow initiation-vallecula;Pharyngeal residue - valleculae Pharyngeal -- Pharyngeal- Mechanical Soft -- Pharyngeal -- Pharyngeal- Regular -- Pharyngeal --  Pharyngeal- Multi-consistency -- Pharyngeal -- Pharyngeal- Pill -- Pharyngeal -- Pharyngeal Comment --  No flowsheet data found. [name] 10/19/2019, 5:19 PM                  LAB RESULTS: Basic Metabolic Panel: Recent Labs  Lab 10/18/19 0432 10/18/19 0432 10/19/19 0631 10/20/19 0548  NA 138   < > 142 144  K 5.4*   < > 4.1 3.6  CL 113*   < > 116* 119*  CO2 16*   < > 17* 17*  GLUCOSE 109*   < > 114* 101*  BUN 65*   < > 53* 44*  CREATININE 2.08*   < >  1.64* 1.35*  CALCIUM 7.5*   < > 7.5* 7.8*  MG 2.1  --   --   --   PHOS 3.1  --   --   --    < > = values in this interval not displayed.   Liver Function Tests: Recent Labs  Lab 10/17/19 2000 10/18/19 0432  AST 14* 15  ALT 21 18  ALKPHOS 53 46  BILITOT 0.6 0.5  PROT 7.4 6.8  ALBUMIN 3.5 3.1*   No results for input(s): LIPASE, AMYLASE in the last 168 hours. No results for input(s): AMMONIA in the last 168 hours. CBC: Recent Labs  Lab 10/17/19 2000 10/18/19 0432 10/19/19 0631 10/19/19 0631 10/20/19 0548  WBC 10.6*   < > 10.6*  --  11.6*  NEUTROABS 9.5*  --   --   --   --   HGB 13.2   < > 11.1*  --  10.5*  HCT 41.3   < > 35.2*  --  33.8*  MCV 92.4   < > 94.1   < > 94.2  PLT 263   < > 217  --  194   < > = values in this interval not displayed.   Cardiac Enzymes: No results for input(s): CKTOTAL, CKMB, CKMBINDEX, TROPONINI in the last 168 hours. BNP: Invalid input(s): POCBNP CBG: No results for input(s): GLUCAP in the last 168 hours.     Disposition and Follow-up: Discharge Instructions    Diet - low sodium heart healthy   Complete by: As directed    Increase activity slowly   Complete by: As directed        DISPOSITION: home    DISCHARGE FOLLOW-UP Follow-up Information    [name], MD. Schedule an appointment as soon as possible for a visit in 2 week(s).   Specialty: Family Medicine Contact information: [address] [place] [address] [phone]            Time coordinating discharge:  67mns   Signed:   REstill CottaM.D. [facility] 10/20/2019, 12:24 PM

## 2019-10-20 NOTE — Care Management Important Message (Signed)
Important Message  Patient Details IM Letter given to Roque Lias SW Case Manager to present to the Patient Name: Austin [name] MRN: [id number] Date of Birth: 1956/07/17   Medicare Important Message Given:  Yes     Kerin Salen 10/20/2019, 11:06 AM

## 2019-10-21 ENCOUNTER — Other Ambulatory Visit: Payer: Self-pay

## 2019-10-21 ENCOUNTER — Ambulatory Visit
Admission: RE | Admit: 2019-10-21 | Discharge: 2019-10-21 | Disposition: A | Discharge status: Home / Self Care | Payer: PPO | Source: Ambulatory Visit | Attending: Radiation Oncology | Admitting: Radiation Oncology

## 2019-10-21 ENCOUNTER — Ambulatory Visit: Payer: PPO | Admitting: Family Medicine

## 2019-10-21 DIAGNOSIS — C7989 Secondary malignant neoplasm of other specified sites: Secondary | ICD-10-CM | POA: Diagnosis not present

## 2019-10-21 DIAGNOSIS — Z510 Encounter for antineoplastic radiation therapy: Secondary | ICD-10-CM | POA: Insufficient documentation

## 2019-10-21 DIAGNOSIS — C7642 Malignant neoplasm of left upper limb: Secondary | ICD-10-CM | POA: Diagnosis not present

## 2019-10-22 ENCOUNTER — Ambulatory Visit: Payer: PPO

## 2019-10-22 LAB — CULTURE, BLOOD (ROUTINE X 2)
Culture: NO GROWTH
Culture: NO GROWTH
Special Requests: ADEQUATE

## 2019-10-24 DIAGNOSIS — C7989 Secondary malignant neoplasm of other specified sites: Secondary | ICD-10-CM | POA: Diagnosis not present

## 2019-10-24 DIAGNOSIS — Z510 Encounter for antineoplastic radiation therapy: Secondary | ICD-10-CM | POA: Diagnosis not present

## 2019-10-25 ENCOUNTER — Other Ambulatory Visit: Payer: Self-pay | Admitting: Family Medicine

## 2019-10-27 ENCOUNTER — Ambulatory Visit: Payer: PPO | Admitting: Radiation Oncology

## 2019-10-28 ENCOUNTER — Ambulatory Visit: Payer: PPO

## 2019-10-31 ENCOUNTER — Ambulatory Visit
Admission: RE | Admit: 2019-10-31 | Discharge: 2019-10-31 | Disposition: A | Discharge status: Home / Self Care | Payer: PPO | Source: Ambulatory Visit | Attending: Radiation Oncology | Admitting: Radiation Oncology

## 2019-10-31 ENCOUNTER — Other Ambulatory Visit: Payer: Self-pay

## 2019-10-31 DIAGNOSIS — C7989 Secondary malignant neoplasm of other specified sites: Secondary | ICD-10-CM | POA: Diagnosis not present

## 2019-10-31 DIAGNOSIS — Z510 Encounter for antineoplastic radiation therapy: Secondary | ICD-10-CM | POA: Diagnosis not present

## 2019-11-01 ENCOUNTER — Other Ambulatory Visit: Payer: Self-pay

## 2019-11-01 ENCOUNTER — Ambulatory Visit
Admission: RE | Admit: 2019-11-01 | Discharge: 2019-11-01 | Disposition: A | Discharge status: Home / Self Care | Payer: PPO | Source: Ambulatory Visit | Attending: Radiation Oncology | Admitting: Radiation Oncology

## 2019-11-01 DIAGNOSIS — Z510 Encounter for antineoplastic radiation therapy: Secondary | ICD-10-CM | POA: Diagnosis not present

## 2019-11-01 DIAGNOSIS — C7989 Secondary malignant neoplasm of other specified sites: Secondary | ICD-10-CM | POA: Diagnosis not present

## 2019-11-02 ENCOUNTER — Other Ambulatory Visit: Payer: Self-pay

## 2019-11-02 ENCOUNTER — Ambulatory Visit
Admission: RE | Admit: 2019-11-02 | Discharge: 2019-11-02 | Disposition: A | Discharge status: Home / Self Care | Payer: PPO | Source: Ambulatory Visit | Attending: Radiation Oncology | Admitting: Radiation Oncology

## 2019-11-02 DIAGNOSIS — Z510 Encounter for antineoplastic radiation therapy: Secondary | ICD-10-CM | POA: Diagnosis not present

## 2019-11-02 DIAGNOSIS — C7989 Secondary malignant neoplasm of other specified sites: Secondary | ICD-10-CM | POA: Diagnosis not present

## 2019-11-03 ENCOUNTER — Ambulatory Visit
Admission: RE | Admit: 2019-11-03 | Discharge: 2019-11-03 | Disposition: A | Discharge status: Home / Self Care | Payer: PPO | Source: Ambulatory Visit | Attending: Radiation Oncology | Admitting: Radiation Oncology

## 2019-11-03 ENCOUNTER — Other Ambulatory Visit: Payer: Self-pay

## 2019-11-03 DIAGNOSIS — Z510 Encounter for antineoplastic radiation therapy: Secondary | ICD-10-CM | POA: Diagnosis not present

## 2019-11-03 DIAGNOSIS — C7989 Secondary malignant neoplasm of other specified sites: Secondary | ICD-10-CM | POA: Diagnosis not present

## 2019-11-04 ENCOUNTER — Encounter: Payer: Self-pay | Admitting: Family Medicine

## 2019-11-04 ENCOUNTER — Ambulatory Visit (INDEPENDENT_AMBULATORY_CARE_PROVIDER_SITE_OTHER): Payer: PPO | Admitting: Family Medicine

## 2019-11-04 ENCOUNTER — Other Ambulatory Visit: Payer: Self-pay

## 2019-11-04 ENCOUNTER — Ambulatory Visit
Admission: RE | Admit: 2019-11-04 | Discharge: 2019-11-04 | Disposition: A | Discharge status: Home / Self Care | Payer: PPO | Source: Ambulatory Visit | Attending: Radiation Oncology | Admitting: Radiation Oncology

## 2019-11-04 VITALS — BP 124/62 | HR 75 | Temp 97.9°F

## 2019-11-04 DIAGNOSIS — J189 Pneumonia, unspecified organism: Secondary | ICD-10-CM

## 2019-11-04 DIAGNOSIS — I10 Essential (primary) hypertension: Secondary | ICD-10-CM | POA: Diagnosis not present

## 2019-11-04 DIAGNOSIS — C7989 Secondary malignant neoplasm of other specified sites: Secondary | ICD-10-CM | POA: Diagnosis not present

## 2019-11-04 DIAGNOSIS — E119 Type 2 diabetes mellitus without complications: Secondary | ICD-10-CM | POA: Diagnosis not present

## 2019-11-04 DIAGNOSIS — E039 Hypothyroidism, unspecified: Secondary | ICD-10-CM

## 2019-11-04 DIAGNOSIS — R600 Localized edema: Secondary | ICD-10-CM | POA: Diagnosis not present

## 2019-11-04 DIAGNOSIS — C649 Malignant neoplasm of unspecified kidney, except renal pelvis: Secondary | ICD-10-CM | POA: Diagnosis not present

## 2019-11-04 DIAGNOSIS — Z510 Encounter for antineoplastic radiation therapy: Secondary | ICD-10-CM | POA: Diagnosis not present

## 2019-11-04 DIAGNOSIS — N179 Acute kidney failure, unspecified: Secondary | ICD-10-CM | POA: Diagnosis not present

## 2019-11-04 LAB — CBC WITH DIFFERENTIAL/PLATELET
Basophils Absolute: 0.1 10*3/uL (ref 0.0–0.1)
Basophils Relative: 1.4 % (ref 0.0–3.0)
Eosinophils Absolute: 0.2 10*3/uL (ref 0.0–0.7)
Eosinophils Relative: 2.3 % (ref 0.0–5.0)
HCT: 35.1 % — ABNORMAL LOW (ref 39.0–52.0)
Hemoglobin: 11.6 g/dL — ABNORMAL LOW (ref 13.0–17.0)
Lymphocytes Relative: 11.6 % — ABNORMAL LOW (ref 12.0–46.0)
Lymphs Abs: 1.1 10*3/uL (ref 0.7–4.0)
MCHC: 33 g/dL (ref 30.0–36.0)
MCV: 89.2 fl (ref 78.0–100.0)
Monocytes Absolute: 1 10*3/uL (ref 0.1–1.0)
Monocytes Relative: 10.8 % (ref 3.0–12.0)
Neutro Abs: 6.9 10*3/uL (ref 1.4–7.7)
Neutrophils Relative %: 73.9 % (ref 43.0–77.0)
Platelets: 310 10*3/uL (ref 150.0–400.0)
RBC: 3.93 Mil/uL — ABNORMAL LOW (ref 4.22–5.81)
RDW: 15.8 % — ABNORMAL HIGH (ref 11.5–15.5)
WBC: 9.4 10*3/uL (ref 4.0–10.5)

## 2019-11-04 LAB — BASIC METABOLIC PANEL
BUN: 37 mg/dL — ABNORMAL HIGH (ref 6–23)
CO2: 23 mEq/L (ref 19–32)
Calcium: 9 mg/dL (ref 8.4–10.5)
Chloride: 106 mEq/L (ref 96–112)
Creatinine, Ser: 1.32 mg/dL (ref 0.40–1.50)
GFR: 54.42 mL/min — ABNORMAL LOW (ref >60.00)
Glucose, Bld: 110 mg/dL — ABNORMAL HIGH (ref 70–99)
Potassium: 5 mEq/L (ref 3.5–5.1)
Sodium: 137 mEq/L (ref 135–145)

## 2019-11-04 MED ORDER — FUROSEMIDE 20 MG PO TABS: 20.0000 mg | ORAL_TABLET | Freq: Every day | ORAL | 3 refills | Status: DC | PRN

## 2019-11-04 NOTE — Patient Instructions (Signed)
Community-Acquired Pneumonia, Adult Pneumonia is a type of lung infection that causes swelling in the airways of the lungs. Mucus and fluid may also build up inside the airways. This may cause coughing and difficulty breathing. There are different types of pneumonia. One type can develop while a person is in a hospital. A different type is called community-acquired pneumonia. It develops in people who are not, and have not recently been, in the hospital or another type of health care facility. What are the causes? This condition may be caused by:  Viruses. This is the most common cause of pneumonia.  Bacteria. Community-acquired pneumonia is often caused by Streptococcus pneumoniae bacteria. These bacteria are often passed from one person to another by breathing in droplets from the cough or sneeze of an infected person.  Fungi. This is the least common cause of pneumonia. What increases the risk? The following factors may make you more likely to develop this condition:  Having a chronic disease, such as chronic obstructive pulmonary disease (COPD), asthma, congestive heart failure, cystic fibrosis, diabetes, or kidney disease.  Having early-stage or late-stage HIV.  Having sickle cell disease.  Having had your spleen removed (splenectomy).  Having poor dental hygiene.  Having a medical condition that increases the risk of breathing in (aspirating) secretions from your own mouth and nose.  Having a weakened body defense system (immune system).  Being a smoker.  Traveling to areas where pneumonia-causing germs commonly exist.  Being around animal habitats or animals that have pneumonia-causing germs, including birds, bats, rabbits, cats, and farm animals. What are the signs or symptoms? Symptoms of this condition include:  A dry cough.  A wet (productive) cough.  Fever.  Sweating.  Chest pain, especially when breathing deeply or coughing.  Rapid breathing or difficulty  breathing.  Shortness of breath.  Shaking chills.  Fatigue.  Muscle aches. How is this diagnosed? This condition may be diagnosed based on:  Your medical history.  A physical exam. You may also have tests, including:  Chest X-rays.  Tests of your blood oxygen level and other blood gases.  Tests on blood, mucus (sputum), fluid around your lungs (pleural fluid), and urine. If your pneumonia is severe, other tests may be done to find the exact cause of your illness. How is this treated? Treatment for this condition depends on many factors, such as the cause of your pneumonia, the medicines you take, and other medical conditions that you have. For most adults, treatment and recovery from pneumonia may occur at home. In some cases, treatment must happen in a hospital. Treatment may include:  Medicines that are given by mouth or through an IV, including: ? Antibiotic medicines, if the pneumonia was caused by bacteria. ? Antiviral medicines, if the pneumonia was caused by a virus.  Being given extra oxygen.  Respiratory therapy. Although rare, treating severe pneumonia may include:  Using a machine to help you breathe (mechanical ventilation). This is done if you are not breathing well on your own and you cannot maintain a safe blood oxygen level.  Thoracentesis. This is a procedure to remove fluid from around one lung or both lungs to help you breathe better. Follow these instructions at home:  Medicines  Take over-the-counter and prescription medicines only as told by your health care provider. ? Only take cough medicine if you are losing sleep. Be aware that cough medicine can prevent your body's natural ability to remove mucus from your lungs.  If you were prescribed an antibiotic  medicine, take it as told by your health care provider. Do not stop taking the antibiotic even if you start to feel better. General instructions  Sleep in a semi-upright position at night. Try  sleeping in a reclining chair, or place a few pillows under your head.  Rest as needed and get at least 8 hours of sleep each night.  Drink enough water to keep your urine pale yellow. This will help to thin out mucus secretions in your lungs.  Eat a healthy diet that includes plenty of vegetables, fruits, whole grains, low-fat dairy products, and lean protein.  Do not use any products that contain nicotine or tobacco, such as cigarettes, e-cigarettes, and chewing tobacco. If you need help quitting, ask your health care provider.  Keep all follow-up visits as told by your health care provider. This is important. How is this prevented? You can lower your risk of developing community-acquired pneumonia by:  Getting a pneumococcal vaccine. There are different types and schedules of pneumococcal vaccines. Ask your health care provider which option is best for you. Consider getting the vaccine if: ? You are older than 65 years of age. ? You are older than 65 years of age and are undergoing cancer treatment, have chronic lung disease, or have other medical conditions that affect your immune system. Ask your health care provider if this applies to you.  Getting an influenza vaccine every year. Ask your health care provider which type of vaccine is best for you.  Getting regular checkups from your dentist.  Washing your hands often. If soap and water are not available, use hand sanitizer. Contact a health care provider if:  You have a fever.  You are losing sleep because you cannot control your cough with cough medicine. Get help right away if:  You have worsening shortness of breath.  You have increased chest pain.  Your sickness becomes worse, especially if you are an older adult or have a weakened immune system.  You cough up blood. Summary  Pneumonia is an infection of the lungs.  Community-acquired pneumonia develops in people who have not been in the hospital. It can be caused  by bacteria, viruses, or fungi.  This condition may be treated with antibiotics or antiviral medicines.  Severe cases may require hospitalization, mechanical ventilation, and other procedures to drain fluid from the lungs. This information is not intended to replace advice given to you by your health care provider. Make sure you discuss any questions you have with your health care provider. Document Revised: 03/25/2018 Document Reviewed: 03/25/2018 Elsevier Patient Education  [place].

## 2019-11-04 NOTE — Progress Notes (Signed)
Subjective:     Patient ID: Austin [name], male   DOB: Feb 16, 1956, 65 y.o.   MRN: [id number]  HPI   Austin [name] is here for hospital follow-up.  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  He was noted in the ER to have leukocytosis, fever, hypoxia, elevated pulse, labs revealed acute kidney injury.  CT scan showed widespread metastatic disease with bibasilar consolidation felt to be compatible with pneumonia.  He met criteria for diagnosis of sepsis.  Started on IV Zithromax and Rocephin then transitioned to oral Zithromax and Vantin.  He was discharged on 11th.  His blood cultures remained negative.  Hospital labs, hospital discharge summary, and multiple imaging reports reviewed.    He had swallowing study and recommendations were made to reduce aspiration.  He had acute kidney injury with creatinine 2.19.  ACE inhibitor was held.  He also had mild hyperkalemia.  Baseline creatinine around 1.1.  Discharge creatinine 1.3.  Had some increased peripheral edema since discharge.  They have used Lasix intermittently for that the past He has had some cold intolerance.  Recent TSH during hospitalization was normal.  No dyspnea.  No cough.  No fever at this time.  Past Medical History:  Diagnosis Date  . Brainstem stroke ([place]) 10/16/2008   Qualifier: Diagnosis of  By: [name] LPN, [name]    . C. difficile colitis 02/08/2017  . Cerebrovascular accident ([place]) 1994   hx of brainstem stroke, residual diminished lung capacity  . Chronic kidney disease    Left Renal Mass  . 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 Diverticulitis   . Duodenal ulcer   . GIB (gastrointestinal bleeding) 05/28/2017  . Hx of colonic polyps   .  Hypertension    has been off BP meds since GI bleed was thought to have been caused by previously prescribed lisinopril   . Hypogonadism   . Intra-abdominal abscess ([place]) 01/23/2017  . Iron deficiency anemia   . 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 Neuromuscular disorder ([place])    quadraplegic  . Open abdominal incision with drainage 01/26/2017  . Pituitary adenoma ([place])   . 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 Pneumonia   . PONV (postoperative nausea and vomiting)    vomited after colostomy placement   . Pre-diabetes    last a1c 5.6   . 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[place])   . Urinary incontinence   . Venous stasis    edema   Past Surgical History:  Procedure Laterality Date  . CHOLECYSTECTOMY    . COLON RESECTION N/A 05/04/2017   Procedure: LAPAROSCOPIC SIGMOID COLON RESECTION WITH END COLSOTOMY ERAS PATHWAY;  Surgeon: Alphonsa Overall, MD;  Location: [facility];  Service: General;  Laterality: N/A;  . COLONOSCOPY  02/27/2012   Procedure: COLONOSCOPY;  Surgeon: Jerene Bears, MD;  Location: [facility];  Service: Gastroenterology;  Laterality: N/A;  . COLONOSCOPY N/A 05/31/2017   Procedure: COLONOSCOPY;  Surgeon: Milus Banister, MD;  Location: [facility];  Service: Endoscopy;  Laterality: N/A;  . COLOSTOMY TAKEDOWN N/A 08/20/2017   Procedure: LAPAROSCOPIC ASSISTED REVISION END COLOSTOMY AND ENTEROLYSIS OF ADHESIONS;  Surgeon: Alphonsa Overall, MD;  Location: [facility];  Service: General;  Laterality: N/A;  ERAS PATHWAY  . ESOPHAGOGASTRODUODENOSCOPY N/A 05/31/2017   Procedure: ESOPHAGOGASTRODUODENOSCOPY (EGD);  Surgeon: Milus Banister, MD;  Location: [facility];  Service: Endoscopy;  Laterality: N/A;  . gamma knife radiation surgery  05/2010   for pituitary  . IR RADIOLOGIST EVAL & MGMT  02/12/2017  . IR RADIOLOGIST EVAL & MGMT  03/17/2017  . IR RADIOLOGIST EVAL & MGMT  04/14/2017  . IR RADIOLOGIST EVAL & MGMT  02/05/2017  .  PITUITARY SURGERY  2007   nose approach  . ROBOT ASSISTED LAPAROSCOPIC NEPHRECTOMY  04/23/2012   Procedure: ROBOTIC ASSISTED LAPAROSCOPIC NEPHRECTOMY;  Surgeon: Alexis Frock, MD;  Location: [facility];  Service: Urology;  Laterality: Left;  radical  . stomach peg  02/1993   removed 5-6 years later  . TRACHEOSTOMY TUBE PLACEMENT  02/1993   removed 04/1993  . UMBILICAL HERNIA REPAIR  04/23/2012   Procedure: HERNIA REPAIR UMBILICAL ADULT;  Surgeon: Alexis Frock, MD;  Location: [facility];  Service: Urology;;  . [name]  . vocal cord surgery     injected with collagen, then fat from stomach to improve speech s/p stroke    reports that he quit smoking about 27 years ago. His smoking use included cigarettes. He has a 11.50 pack-year smoking history. He has never used smokeless tobacco. He reports that he does not drink alcohol or use drugs. family history includes Alcohol abuse in his father; Arthritis in his maternal grandmother and mother; Colon cancer in his brother; Esophageal cancer (age of onset: 57) in his father; Hyperlipidemia in his mother; Hypertension in his brother; Stroke in his paternal grandmother; Throat cancer in his father; Uterine cancer (age of onset: 66) in his mother. Allergies  Allergen Reactions  . Penicillins Rash and Other (See Comments)    Has patient had a PCN reaction causing immediate rash, facial/tongue/throat swelling, SOB or lightheadedness with hypotension: YES Has patient had a PCN reaction causing severe rash involving mucus membranes or skin necrosis: No Has patient had a PCN reaction that required hospitalization No Has patient had a PCN reaction occurring within the last 10 years: Yes  If all of the above answers are "NO", then may proceed with Cephalosporin use.         Review of Systems  Constitutional: Positive for fatigue. Negative for chills, fever and unexpected weight change.  Respiratory: Negative for shortness of breath.   Cardiovascular:  Positive for leg swelling. Negative for chest pain and palpitations.  Gastrointestinal: Negative for abdominal pain, nausea and vomiting.  Psychiatric/Behavioral: Negative for confusion.       Objective:   Physical Exam Vitals reviewed.  Constitutional:      Appearance: Normal appearance.  Cardiovascular:     Rate and Rhythm: Normal rate and regular rhythm.  Pulmonary:     Comments: He has some crackles in the right base.  Pulse oximetry 99%.  No increased work of breathing.  Normal respiratory rate Musculoskeletal:     Right lower leg: Edema present.     Left lower leg: Edema present.     Comments: 1+ pitting edema legs bilaterally  Neurological:     General: No focal deficit present.     Mental Status: He is alert.        Assessment:     #1 recent bilateral community-acquired pneumonia and patient is very high risk for complication and increased risk for aspiration-improved following treatment course with Zithromax and Vantin  #2 bilateral leg edema which is chronic and intermittent  #3  recent acute kidney injury-probably related to dehydration and improved during hospitalization with hydration therapy  #4 recent mild hyperkalemia during hospitalization.  Patient now back on ACE inhibitor  #5 metastatic renal cell carcinoma.  #6 tumor left arm currently being treated with radiation therapy  #6 history of brainstem stroke with quadriplegia  #7 hypothyroidism-recent TSH at goal    Plan:     -Recheck CBC with differential and basic metabolic panel  -Refill Lasix 20 mg for as needed use.  -We discussed increasing care needs.  Wife has interest in considering palliative care which I think is a good option at this point.  She is having increasing physical demands of trying to help him transfer and hopefully can get some help for palliative care  [name] MD [facility]

## 2019-11-07 ENCOUNTER — Other Ambulatory Visit: Payer: Self-pay

## 2019-11-07 ENCOUNTER — Ambulatory Visit
Admission: RE | Admit: 2019-11-07 | Discharge: 2019-11-07 | Disposition: A | Discharge status: Home / Self Care | Payer: PPO | Source: Ambulatory Visit | Attending: Radiation Oncology | Admitting: Radiation Oncology

## 2019-11-07 DIAGNOSIS — R32 Unspecified urinary incontinence: Secondary | ICD-10-CM | POA: Diagnosis not present

## 2019-11-07 DIAGNOSIS — Z510 Encounter for antineoplastic radiation therapy: Secondary | ICD-10-CM | POA: Diagnosis not present

## 2019-11-07 DIAGNOSIS — C7989 Secondary malignant neoplasm of other specified sites: Secondary | ICD-10-CM | POA: Diagnosis not present

## 2019-11-08 ENCOUNTER — Other Ambulatory Visit: Payer: Self-pay

## 2019-11-08 ENCOUNTER — Ambulatory Visit
Admission: RE | Admit: 2019-11-08 | Discharge: 2019-11-08 | Disposition: A | Discharge status: Home / Self Care | Payer: PPO | Source: Ambulatory Visit | Attending: Radiation Oncology | Admitting: Radiation Oncology

## 2019-11-08 ENCOUNTER — Encounter: Payer: Self-pay | Admitting: Radiation Oncology

## 2019-11-08 DIAGNOSIS — C7989 Secondary malignant neoplasm of other specified sites: Secondary | ICD-10-CM | POA: Diagnosis not present

## 2019-11-08 DIAGNOSIS — Z510 Encounter for antineoplastic radiation therapy: Secondary | ICD-10-CM | POA: Diagnosis not present

## 2019-11-08 DIAGNOSIS — Z933 Colostomy status: Secondary | ICD-10-CM | POA: Diagnosis not present

## 2019-11-09 ENCOUNTER — Other Ambulatory Visit: Payer: Self-pay

## 2019-11-09 ENCOUNTER — Ambulatory Visit: Payer: PPO

## 2019-11-09 ENCOUNTER — Ambulatory Visit
Admission: RE | Admit: 2019-11-09 | Discharge: 2019-11-09 | Disposition: A | Discharge status: Home / Self Care | Payer: PPO | Source: Ambulatory Visit | Attending: Radiation Oncology | Admitting: Radiation Oncology

## 2019-11-09 DIAGNOSIS — Z510 Encounter for antineoplastic radiation therapy: Secondary | ICD-10-CM | POA: Diagnosis not present

## 2019-11-09 DIAGNOSIS — C7989 Secondary malignant neoplasm of other specified sites: Secondary | ICD-10-CM | POA: Diagnosis not present

## 2019-11-10 ENCOUNTER — Ambulatory Visit
Admission: RE | Admit: 2019-11-10 | Discharge: 2019-11-10 | Disposition: A | Discharge status: Home / Self Care | Payer: PPO | Source: Ambulatory Visit | Attending: Radiation Oncology | Admitting: Radiation Oncology

## 2019-11-10 ENCOUNTER — Other Ambulatory Visit: Payer: Self-pay

## 2019-11-10 DIAGNOSIS — C7989 Secondary malignant neoplasm of other specified sites: Secondary | ICD-10-CM | POA: Diagnosis not present

## 2019-11-10 DIAGNOSIS — Z510 Encounter for antineoplastic radiation therapy: Secondary | ICD-10-CM | POA: Diagnosis not present

## 2019-11-10 DIAGNOSIS — C7642 Malignant neoplasm of left upper limb: Secondary | ICD-10-CM | POA: Diagnosis not present

## 2019-11-11 ENCOUNTER — Encounter: Payer: Self-pay | Admitting: Radiation Oncology

## 2019-11-11 ENCOUNTER — Ambulatory Visit
Admission: RE | Admit: 2019-11-11 | Discharge: 2019-11-11 | Disposition: A | Discharge status: Home / Self Care | Payer: PPO | Source: Ambulatory Visit | Attending: Radiation Oncology | Admitting: Radiation Oncology

## 2019-11-11 ENCOUNTER — Other Ambulatory Visit: Payer: Self-pay

## 2019-11-11 DIAGNOSIS — Z510 Encounter for antineoplastic radiation therapy: Secondary | ICD-10-CM | POA: Diagnosis not present

## 2019-11-11 DIAGNOSIS — C7989 Secondary malignant neoplasm of other specified sites: Secondary | ICD-10-CM | POA: Diagnosis not present

## 2019-11-14 ENCOUNTER — Ambulatory Visit: Payer: PPO | Attending: Internal Medicine

## 2019-11-14 ENCOUNTER — Ambulatory Visit: Payer: Self-pay

## 2019-11-14 DIAGNOSIS — Z23 Encounter for immunization: Secondary | ICD-10-CM

## 2019-11-14 NOTE — Progress Notes (Signed)
   Covid-19 Vaccination Clinic  Name:  Austin [name]    MRN: [id number] DOB: 1955/11/12  11/14/2019  [name] was observed post Covid-19 immunization for 15 minutes without incident. He was provided with Vaccine Information Sheet and instruction to access the V-Safe system.   [name] was instructed to call 911 with any severe reactions post vaccine: Marland Kitchen Difficulty breathing  . Swelling of face and throat  . A fast heartbeat  . A bad rash all over body  . Dizziness and weakness   Immunizations Administered    Name Date Dose VIS Date Route   Pfizer COVID-19 Vaccine 11/14/2019  1:21 PM 0.3 mL 07/22/2019 Intramuscular   Manufacturer: [place]   Lot: [id number]   [place]: [id number]

## 2019-11-14 NOTE — Chronic Care Management (AMB) (Signed)
  Chronic Care Management Pharmacy  Name: Austin [name]  MRN: [id number] DOB: 12/13/1955  Reviewed patient's office visits, consult notes, and hospital visits prior to telephone call.   Contacted Brennan Litzinger (legal guardian) for Hypertension follow up.   Spouse notes SBP: 150s. (does not go higher than that)  She notes patient was hospitalized a month ago with pneumonia and patient just finished a 10 days course of radiation treatment.  A lot going on. She notes better BP readings while hospitalized. BP: 120s.  Spouse does not check BP every day, but when she does she checks BP right before putting patient to bed.   Amlodipine was increased from 5mg  to 10mg  on 09/16/2019. Spouse denies increased swelling from usual. She has been using compression stockings and was told by [facility] nurse to give Lasix 3 to 5 days which has helped with intermittent swelling.   Patient to obtain first COVID vaccine today, 4/05.   Plan Spouse to check BP daily for a week. Will provide follow up call in 1 week to review BP readings.    Anson Crofts, PharmD Clinical Pharmacist [facility] [place] [phone]

## 2019-11-18 DIAGNOSIS — C642 Malignant neoplasm of left kidney, except renal pelvis: Secondary | ICD-10-CM | POA: Diagnosis not present

## 2019-11-18 DIAGNOSIS — C778 Secondary and unspecified malignant neoplasm of lymph nodes of multiple regions: Secondary | ICD-10-CM | POA: Diagnosis not present

## 2019-11-18 DIAGNOSIS — N319 Neuromuscular dysfunction of bladder, unspecified: Secondary | ICD-10-CM | POA: Diagnosis not present

## 2019-11-22 NOTE — Chronic Care Management (AMB) (Signed)
Chronic Care Management   Follow Up Note 11/22/2019 Name: Austin [name] MRN: [id number] DOB: 09/18/1955  Referred by: [name], MD Reason for referral : Chronic Care Management   Austin [name] is a 64 y.o. year old male who is a primary care patient of [name], [name], MD. The CCM team was consulted for assistance with chronic disease management and care coordination needs.    Review of patient status, including review of consultants reports, relevant laboratory and other test results, and collaboration with appropriate care team members and the patient's provider was performed as part of comprehensive patient evaluation and provision of chronic care management services.    SDOH (Social Determinants of Health) assessments performed: Yes See Care Plan activities for detailed interventions related to [facility])     Outpatient Encounter Medications as of 11/14/2019  Medication Sig  . acetaminophen (TYLENOL) 500 MG tablet Take 1 tablet (500 mg total) by mouth every 6 (six) hours as needed for mild pain, moderate pain, fever or headache.  Marland Kitchen amLODipine (NORVASC) 10 MG tablet Take 1 tablet (10 mg total) by mouth daily.  . BD DISP NEEDLES 18G X 1-1/2" MISC USE TO INJECT TESTOSTERONE INTO THE MUSCLE AS DIRECTED EVERY 14 DAYS  . escitalopram (LEXAPRO) 10 MG tablet TAKE 1 TABLET BY MOUTH IN THE MORNING  . furosemide (LASIX) 20 MG tablet Take 1 tablet (20 mg total) by mouth daily as needed for fluid or edema.  . [EXPIRED] guaiFENesin (MUCINEX) 600 MG 12 hr tablet Take 1 tablet (600 mg total) by mouth 2 (two) times daily.  Marland Kitchen guaiFENesin-dextromethorphan (ROBITUSSIN DM) 100-10 MG/5ML syrup Take 10 mLs by mouth every 4 (four) hours as needed for cough.  Marland Kitchen ketoconazole (NIZORAL) 2 % cream APPLY AS NEEDED TO RASH (Patient taking differently: Apply 1 application topically daily as needed for irritation (rash). )  . levothyroxine (SYNTHROID) 25 MCG tablet TAKE 1 TABLET BY MOUTH ONCE DAILY  BEFORE BREAKFAST (Patient taking differently: Take 25 mcg by mouth daily before breakfast. )  . lisinopril-hydrochlorothiazide (ZESTORETIC) 20-12.5 MG tablet Take 1 tablet by mouth daily.  . Melatonin 3 MG TABS Take 3 mg by mouth at bedtime as needed (for sleep).  . Multiple Vitamin (MULTIVITAMIN WITH MINERALS) TABS tablet Take 1 tablet by mouth daily with breakfast.   . SAFETY-LOK 3CC SYR 22GX1.5" 22G X 1-1/2" 3 ML MISC USE TO INJECT 1 ML OF TESTOSTERONE EVERY 14 DAYS  . testosterone enanthate (DELATESTRYL) 200 MG/ML injection INJECT 1 ML INTRAMUSCULARLY  EVERY TWO WEEKS  . traMADol (ULTRAM) 50 MG tablet TAKE 1 TABLET BY MOUTH EVERY 6 HOURS AS NEEDED (Patient taking differently: Take 50 mg by mouth every 6 (six) hours as needed for moderate pain. )  . traZODone (DESYREL) 100 MG tablet TAKE 1 TABLET BY MOUTH AT BEDTIME  . triamcinolone cream (KENALOG) 0.1 % Apply 1 application topically 2 (two) times daily as needed (for rash). Applies to face   No facility-administered encounter medications on file as of 11/14/2019.     Objective:   Goals Addressed   None      Hypertension   Office blood pressures are  BP Readings from Last 3 Encounters:  11/04/19 124/62  10/20/19 (!) 151/65  09/16/19 (!) 167/74   Patient has failed these meds in the past: none   Patient checks BP at home twice daily   Patient home BP readings are ranging:  Systolic: 637-858 Diastolic: 85-02  Patient is uncontrolled on:  - lisinopril/  HCTZ 20-12.5mg , 1 tablet once daily  - amlodipine 10mg , 1 tablet once daily  - since dose of amlodipine has been increased in Feb, spouse notes no difference in swelling.   - Spouse inquired about palliative referral since it has been 2 weeks and she has not heard back.   Plan:   Follow up with provider re: referral to palliative care and hypertension regimen modification.   - Consulted referral coordinator. Referral to palliative care has been modified  and patient/spouse  should be getting a call from them soon.   - Consulted with Dr. [name]. Goal for systolic: <903.  Agreed to increase lisinopril/ HCTZ 20/12.5mg  to 20/25mg  once daily. Spoke to spouse and patient and agreed with dose increase. Will be following up in 2 weeks for BP readings.   Spouse also reported she now obtained call from palliative care and nurse practitioner will be going to their home on Monday.     Austin [name], PharmD Clinical Pharmacist [facility] [place] [phone]

## 2019-11-23 ENCOUNTER — Telehealth: Payer: Self-pay | Admitting: Internal Medicine

## 2019-11-23 ENCOUNTER — Other Ambulatory Visit: Payer: Self-pay | Admitting: Family Medicine

## 2019-11-23 MED ORDER — LISINOPRIL-HYDROCHLOROTHIAZIDE 20-25 MG PO TABS: 1.0000 | ORAL_TABLET | Freq: Every day | ORAL | 3 refills | Status: DC

## 2019-11-23 NOTE — Progress Notes (Signed)
Increased dose of Lisinopril HCTZ to 20/25 mg base on several recent increased BP readings above goal

## 2019-11-23 NOTE — Telephone Encounter (Signed)
Scheduled [facility] Palliative visit for 11-28-19 at 3:30.

## 2019-11-27 NOTE — Progress Notes (Addendum)
April 19th, 2021 [facility] Consult Note Telephone: [phone]  Fax: [phone]  PATIENT NAME: Austin [name] DOB: [birth year] MRN: [id number]  [address] [place], [place] [address]  PRIMARY CARE PROVIDER:   [name], MD Dr. Eppie [name] ([place]) Dr. Alen [name] (Oncology)  REFERRING PROVIDER:  [name], MD [place],  [place] [address]  [facility]: Journalist, newspaper.   RESPONSIBLE PARTY: (spouse) Austin [name] (legal guardian)  ASSESSMENT / RECOMMENDATIONS: (M) [phone] 1. Advance Care Planning: A. Directives: Addressed with spouse, who had spoken with patient about this. Patient desires full resuscitative efforts in the event of a cardiopulmonary arrest, but would not wish prolonged support if condition felt hopeless (has living will in place). B. Goals of Care: No wish to pursue aggressive care for his cancer (intolerance to trial of chemo 2015), but would wish for hospitalization and treatment if necessary, for acute illnesses. Just completed palliative radiation therapy L upper arm for tumor (thought d/t metastatic disease). Also are interested in periodic surveillance scans. Should tumor progress, would likely seek only palliative therapy if tolerated, for pain relief. We discussed scope of Hospice services. They would consider should patient decide no further hospitalizations and/or scans; comfort care only.  -Spouse plans to resource [facility] NP (visits provided through her Medicare Advantage program) for order for [place] life.   2. Cognitive / Functional status: Patient is A & O x 3. Very soft vocalization/difficult to hear/understand.  Patient is a quadriplegic and is 100% dependent on all ADLs. He can feed himself using utensils but notes increasingly difficult. Spouse and patient report progressive overall weakness since 3 d March hospitalization. He  can stand with heavy assist, weight-bear and hold himself up when grasping a bar. Increasingly, he needs to hold himself up by resting his head on the shoulder of his much shorter wife. She is finding his care increasingly difficult. It's much harder for patient to pivot. He can maneuver about in his electric wheelchair. He has an external condom cath, and spouse does I & O cath qid to empty bladder; found to have significantly decreased frequency of UTIs. He has a colostomy bag managed by spouse.  He notes feeling cold, no matter what the temperature. Recent lab work was unremarkable.   Last weight 180lbs. At a height of 6' his BMI is 24.41kg/m2. Fair appetite.  3. Family Supports: Patient is married for 45 yrs to his very supportive spouse Austin [name]. 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 The couple [place] to [place] from [place] ago to be with her daughter here in [place]. Two supportive daughters and one son all well. Austin [name] has utilized aides in the past but found that she had to train each time, that the aids were inconsistent in coming to the home, and that unless they were certified couldn't assist with transfers. Austin [name] would be interest if there were a program that would pay her to provide her husband's personal care.  -Check with our Palliative Care SW if program available for pay of family members providing care in the home.  4. Follow up Palliative Care Visit: on a prn basis/signs of decline. Spouse has my contact information. They will continue to follow with [facility] NP for close follow up home visits.   I spent 60 minutes providing this consultation from 3:30pm to 4:30pm. More than 50% of the time in this consultation was spent coordinating communication.  HISTORY OF PRESENT ILLNESS:  Austin [name] is a 64 y.o.  male with h/o   -Renal cell carcinoma stage IV (dx 2013/L nephrectomy/chemo 2015; discontinued chemo after 1 month d/t poor tolerance and patient has since declined further  therapy) widespread metastases (peritoneal/retroperitoneal/lung), developed left arm mass (just completed XRT) most recent systemic scans on 03/10/2019 showed: interval increase in lobular right retroperitoneal mass; increase in size of smaller right pararenal space nodule; new small peritoneal metastasis in right abdomen; stable spleen lesion; no evidence local recurrence in left nephrectomy bed; stable left adrenal nodule; stable bilateral round pulmonary nodular metastasis; no mediastinal nodal metastasis.  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  3/8-3/11/21 hospitalized with sepsis d/t pneumonia/dehydration. SLP recommended regular diet with thin liquids/straw/chin tuck. Acute kidney injury on CKD stage IIIa d/t dehydration (Creating 2.19 ->1.3)  Palliative Care was asked to help address goals of care.   CODE STATUS: full  PPS: 30%  HOSPICE ELIGIBILITY/DIAGNOSIS: TBD  PAST MEDICAL HISTORY:  Past Medical History:  Diagnosis Date  . Brainstem stroke ([place]) 10/16/2008   Qualifier: Diagnosis of  By: [name] LPN, [name]    . C. difficile colitis 02/08/2017  . Cerebrovascular accident ([place]) 1994   hx of brainstem stroke, residual diminished lung capacity  . Chronic kidney disease    Left Renal Mass  . 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 Diverticulitis   . Duodenal ulcer   . GIB (gastrointestinal bleeding) 05/28/2017  . Hx of colonic polyps   . Hypertension    has been off BP meds since GI bleed was thought to have been caused by previously prescribed lisinopril   . Hypogonadism   . Intra-abdominal abscess ([place]) 01/23/2017  . Iron deficiency anemia   . 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 Neuromuscular disorder ([place])     quadraplegic  . Open abdominal incision with drainage 01/26/2017  . Pituitary adenoma ([place])   . Pituitary macroadenoma ([place])    progression into right cavernous sinus  . Pneumonia   . PONV (postoperative nausea and vomiting)    vomited after colostomy placement   . Pre-diabetes    last a1c 5.6   . 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[place])   . Urinary incontinence   . Venous stasis    edema    SOCIAL HX:  Social History   Tobacco Use  . Smoking status: Former Smoker    Packs/day: 0.50    Years: 23.00    Pack years: 11.50    Types: Cigarettes    Quit date: 09/24/1992    Years since quitting: 27.1  . Smokeless tobacco: Never Used  Substance Use Topics  . Alcohol use: No    Comment: rare    ALLERGIES:  Allergies  Allergen Reactions  . Penicillins Rash and Other (See Comments)    Has patient had a PCN reaction causing immediate rash, facial/tongue/throat swelling, SOB or lightheadedness with hypotension: YES Has patient had a PCN reaction causing severe rash involving mucus membranes or skin necrosis: No Has patient had a PCN reaction that required hospitalization No Has patient had a PCN reaction occurring within the last 10 years: Yes  If all of the above answers are "NO", then may proceed with Cephalosporin use.     PERTINENT MEDICATIONS:  Outpatient Encounter Medications as of 11/28/2019  Medication  Sig  . acetaminophen (TYLENOL) 500 MG tablet Take 1 tablet (500 mg total) by mouth every 6 (six) hours as needed for mild pain, moderate pain, fever or headache.  Marland Kitchen amLODipine (NORVASC) 10 MG tablet Take 1 tablet (10 mg total) by mouth daily.  . BD DISP NEEDLES 18G X 1-1/2" MISC USE TO INJECT TESTOSTERONE INTO THE MUSCLE AS DIRECTED EVERY 14 DAYS  . escitalopram (LEXAPRO) 10 MG tablet TAKE 1 TABLET BY MOUTH IN THE MORNING  . furosemide (LASIX) 20 MG tablet Take 1 tablet (20 mg total) by mouth daily as needed for fluid or edema.  Marland Kitchen guaiFENesin-dextromethorphan (ROBITUSSIN DM) 100-10 MG/5ML syrup  Take 10 mLs by mouth every 4 (four) hours as needed for cough.  Marland Kitchen ketoconazole (NIZORAL) 2 % cream APPLY AS NEEDED TO RASH (Patient taking differently: Apply 1 application topically daily as needed for irritation (rash). )  . levothyroxine (SYNTHROID) 25 MCG tablet TAKE 1 TABLET BY MOUTH ONCE DAILY BEFORE BREAKFAST (Patient taking differently: Take 25 mcg by mouth daily before breakfast. )  . lisinopril-hydrochlorothiazide (ZESTORETIC) 20-25 MG tablet Take 1 tablet by mouth daily.  . Melatonin 3 MG TABS Take 3 mg by mouth at bedtime as needed (for sleep).  . Multiple Vitamin (MULTIVITAMIN WITH MINERALS) TABS tablet Take 1 tablet by mouth daily with breakfast.   . SAFETY-LOK 3CC SYR 22GX1.5" 22G X 1-1/2" 3 ML MISC USE TO INJECT 1 ML OF TESTOSTERONE EVERY 14 DAYS  . testosterone enanthate (DELATESTRYL) 200 MG/ML injection INJECT 1 ML INTRAMUSCULARLY  EVERY TWO WEEKS  . traMADol (ULTRAM) 50 MG tablet TAKE 1 TABLET BY MOUTH EVERY 6 HOURS AS NEEDED (Patient taking differently: Take 50 mg by mouth every 6 (six) hours as needed for moderate pain. )  . traZODone (DESYREL) 100 MG tablet TAKE 1 TABLET BY MOUTH AT BEDTIME  . triamcinolone cream (KENALOG) 0.1 % Apply 1 application topically 2 (two) times daily as needed (for rash). Applies to face   No facility-administered encounter medications on file as of 11/28/2019.    PHYSICAL EXAM:   General: Well nourished, maintaining good eye contact, alert and oriented, very soft vocalizations. Sitting up in electric wheelchair. Spouse Gloria in attendance.  PE deferred to limit COVID exposure. Neurological: Weakness   Julianne Handler, NP

## 2019-11-28 ENCOUNTER — Encounter: Payer: Self-pay | Admitting: Internal Medicine

## 2019-11-28 ENCOUNTER — Other Ambulatory Visit: Payer: PPO | Admitting: Internal Medicine

## 2019-11-28 ENCOUNTER — Other Ambulatory Visit: Payer: Self-pay

## 2019-11-28 DIAGNOSIS — Z515 Encounter for palliative care: Secondary | ICD-10-CM

## 2019-11-28 DIAGNOSIS — Z7189 Other specified counseling: Secondary | ICD-10-CM

## 2019-12-06 ENCOUNTER — Ambulatory Visit: Payer: PPO | Attending: Internal Medicine

## 2019-12-06 DIAGNOSIS — Z23 Encounter for immunization: Secondary | ICD-10-CM

## 2019-12-06 NOTE — Progress Notes (Signed)
   Covid-19 Vaccination Clinic  Name:  KACI FREEL    MRN: [id number] DOB: 02/03/1956  12/06/2019  Mr. Jurgens was observed post Covid-19 immunization for 15 minutes without incident. He was provided with Vaccine Information Sheet and instruction to access the V-Safe system.   Mr. Frasier was instructed to call 911 with any severe reactions post vaccine: Marland Kitchen Difficulty breathing  . Swelling of face and throat  . A fast heartbeat  . A bad rash all over body  . Dizziness and weakness   Immunizations Administered    Name Date Dose VIS Date Route   Pfizer COVID-19 Vaccine 12/06/2019 11:19 AM 0.3 mL 10/05/2018 Intramuscular   Manufacturer: [place]   Lot: [id number]   [place]: [address]-6

## 2019-12-13 ENCOUNTER — Telehealth: Payer: Self-pay

## 2019-12-13 NOTE — Telephone Encounter (Signed)
I called the Austin [name]'s spouse Austin [name] today about her husband's upcoming follow-up appointment in radiation oncology.   Given the state of the  COVID-19 pandemic, concerning case numbers in our community, and guidance from [facility], I offered a phone assessment with the Austin [name] and his wife to determine if coming to the clinic was necessary. They accepted.  I let the Austin [name] and his wife know that I had spoken with Dr. Isidore Moos, and she wanted them to know the importance of washing their hands for at least 20 seconds at a time, especially after going out in public, and before they eat. Limit going out in public whenever possible. Do not touch your face, unless your hands are clean, such as when bathing. Get plenty of rest, eat well, and stay hydrated. Austin [name] and Austin [name] verbalized understanding and agreement.  Symptomatically, the Austin [name] is doing relatively well. They report that skin on left arm is still red "like a sunburn", and peels occasionally, but that Austin [name] has been applying a sensitive skin cream recommended by her dermatologist. I recommended she also apply a lotion or oil with vitamin E to help promote moisturizing and skin healing. Austin [name] has gotten weaker in his lower extremities Austin [name] is having more difficulty helping him transfer) but they are meeting with a [employer] representative next week to hopefully get approval for some kind of lift device to help with transfers. Austin [name] did have to go to the hospital back in March for pneumonia but was discharged relatively quickly and had several follow-ups/check ins from Austin [name]'s [facility] team.  All questions were answered to the Austin [name]'s satisfaction.  I encouraged the Austin [name] to call with any further questions. Otherwise, the plan is follow up with Dr. Alen Blew on 01/19/2020 after CT scan on 01/13/2020 and decide if Austin [name] and Dr. Alen Blew feel another evaluation with Dr. Isidore Moos is warrented.  Provided my direct number should  something change and Austin [name] feel he needs to be seen by Dr. Isidore Moos sooner  Austin [name] and his wife are pleased with this plan, and we will cancel their upcoming follow-up to reduce the risk of COVID-19 transmission.

## 2019-12-14 ENCOUNTER — Ambulatory Visit: Payer: PPO | Admitting: Radiation Oncology

## 2019-12-14 NOTE — Progress Notes (Signed)
  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   Plan: The patient will follow-up with radiation oncology in 1 mo, or PRN.  -----------------------------------  Eppie Gibson, MD

## 2019-12-19 ENCOUNTER — Other Ambulatory Visit: Payer: Self-pay | Admitting: Family Medicine

## 2019-12-27 ENCOUNTER — Telehealth: Payer: Self-pay

## 2019-12-27 NOTE — Telephone Encounter (Signed)
Received message to call Verdene Lennert RN with [facility] regarding patient status. Return message left for [employer]

## 2019-12-28 ENCOUNTER — Telehealth: Payer: Self-pay

## 2019-12-28 NOTE — Telephone Encounter (Signed)
Received return call from [place] with [facility]. Per Verdene Lennert, patient's wife would like Palliative care to reach out and schedule a follow up visit. Wife had questions concerning Palliative care services.

## 2019-12-29 NOTE — Telephone Encounter (Signed)
Spoke with patient's wife, Peter Congo, regarding Palliative care follow up. Peter Congo shared that patient has demonstrated more difficulty with transfers and she has inquired about the possibility of a hoyer lift. Discussed with Stanton Kidney NP, who directed this RN to contact PCP. Scheduled follow up visit with Palliative care for 01/05/20 @ 1pm

## 2020-01-05 ENCOUNTER — Other Ambulatory Visit: Payer: PPO | Admitting: Internal Medicine

## 2020-01-05 ENCOUNTER — Telehealth: Payer: Self-pay

## 2020-01-05 ENCOUNTER — Encounter: Payer: Self-pay | Admitting: Internal Medicine

## 2020-01-05 ENCOUNTER — Other Ambulatory Visit: Payer: Self-pay

## 2020-01-05 DIAGNOSIS — Z515 Encounter for palliative care: Secondary | ICD-10-CM | POA: Diagnosis not present

## 2020-01-05 DIAGNOSIS — Z7189 Other specified counseling: Secondary | ICD-10-CM | POA: Diagnosis not present

## 2020-01-05 DIAGNOSIS — R531 Weakness: Secondary | ICD-10-CM

## 2020-01-05 NOTE — Telephone Encounter (Signed)
At request of Stanton Kidney NP, message sent to PCP to provide update on Palliative care visit including recommendation for PT to evaluate regarding increased weakness and difficulty with transfers.

## 2020-01-05 NOTE — Progress Notes (Signed)
May 27th, 2021 [facility] Palliative Care Consult Note Telephone: [phone]  Fax: [phone]    PATIENT NAME: Austin [name] DOB: 10/27/1955 MRN: [id number]  [address] [place], [place] [address]   PRIMARY CARE PROVIDER:   [name], MD Dr. Eppie Gibson ([place]) Dr. Alen Blew (Oncology)    REFERRING PROVIDER:  [name], MD [place],  [place] [address]   [facility] NP Geni Bers (visits provided through her [facility] Advantage program).  RESPONSIBLE PARTY: (spouse) Arley Salamone (legal guardian)  ASSESSMENT / RECOMMENDATIONS: (M) [phone] 1. Advance Care Planning:  A. Directives: Reviewed. Patient desires full resuscitative efforts in the event of a cardiopulmonary arrest but would not wish prolonged support if condition felt hopeless (has living will in place).    B. Goals of Care: Again, reiterates no wish to pursue aggressive care for his cancer (intolerance to trial of chemo 2015), but would wish hospitalization and treatment if necessary, for acute illnesses. Completed palliative radiation therapy L upper arm for tumor (thought d/t metastatic disease from kidney). Plan f/u surveillance CT 6/4 with f/u OV with Dr. Alen Blew 6/10. Gradual progressive size (over last 2 yrs) of LUE tumor to golf ball size; shrinkage to ping pong ball sized after radiation.   2. Cognitive / Functional status: Patient is A & O x 3. Very soft vocalization/difficult to hear/understand.   Patient is a quadriplegic and is 100% dependent on all ADLs. He can feed himself using utensils but increasingly spouse feeding him 25-100% of the time d/t patient fatigue.  Spouse and patient report progressive overall weakness. It is increasingly more difficult for him to stand without heavy assist. More difficult to utilize a grab bar to help transfer. He now consistently needs to hold himself up by resting his  head on the shoulder of his much shorter wife. Spouse is finding his care increasingly difficult. They have a turn disk upon which he can stand, and thus pivot.  He can maneuver about in his electric wheelchair. He has an external condom cath worn during the day (minimal output), and spouse does I & O cath qid to empty bladder. He has a colostomy bag managed by spouse.  Maintaining his weight at 180lbs. At a height of 6' his BMI is 24.41kg/m2. Fair appetite.  LUE pain from tumor well managed with Adivil 265m bid, with rare use of prn Tramadol. States never pain free but can tolerate current level. Is not interest in augmenting or increasing pain meds. Pain not so severe that it interferes with sleep or ADLs.    -Will ask patients PCP for PT/OT consult; evaluate for sit-to-stand device or Hoyer lift. Will ask if could also have aide as well, for personal care assist (I think his insurance will cover for aide, for as long as ongoing PT).   3. 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 GPeter Congohas utilized aides in the past but found that she had to train each time, that the aids were inconsistent in coming to the home, and that unless they were certified couldnt assist with transfers.  We discussed asking contacts in her church family contacts for aides for hire.  4. Follow up  Palliative Care Visit: on a prn basis/signs of decline. Spouse has my contact information. They will continue to follow with [facility] NP for close follow up home visits.    I spent 60 minutes providing this consultation from 3:30pm to 4:30pm. More than 50% of the time in this consultation was spent coordinating communication.    HISTORY OF PRESENT ILLNESS:   Austin [name] is a 64 y.o.  male with h/o   -Renal cell  carcinoma stage IV (dx 2013/L nephrectomy/chemo 2015; discontinued chemo after 1 month d/t poor tolerance and patient has since declined further therapy) widespread metastases (peritoneal/retroperitoneal/lung), developed left arm mass (just completed XRT) most recent systemic scans on 03/10/2019 showed: interval increase in lobular right retroperitoneal mass; increase in size of smaller right pararenal space nodule; new small peritoneal metastasis in right abdomen; stable spleen lesion; no evidence local recurrence in left nephrectomy bed; stable left adrenal nodule; stable bilateral round pulmonary nodular metastasis; no mediastinal nodal metastasis.   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   3/8-3/11/21 hospitalized with sepsis d/t pneumonia/dehydration. SLP recommended regular diet with thin liquids/straw/chin tuck. Acute kidney injury on CKD stage IIIa d/t dehydration (Creating 2.19 ->1.3)   This is a f/u Palliative Care visit from 11/28/2019 for ongoing assist to help address goals of care.    CODE STATUS: full   PPS: 30%   HOSPICE ELIGIBILITY/DIAGNOSIS: TBD PAST MEDICAL HISTORY:  Past Medical History:  Diagnosis Date   Brainstem stroke ([place]) 10/16/2008   Qualifier: Diagnosis of  By: [name] LPN, [place] Lore. 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[place])    Diverticulitis    Duodenal ulcer    GIB (gastrointestinal bleeding) 05/28/2017   Hx of colonic polyps    Hypertension    has been off BP meds since GI bleed was thought to have been caused by previously prescribed lisinopril    Hypogonadism      Intra-abdominal abscess ([place]) 01/23/2017   Iron deficiency anemia    met renal ca to peritoneal and retroperitoneal dx'd 12/2011   lt nephrectomy   Neuromuscular disorder ([place])    quadraplegic   Open abdominal incision with drainage 01/26/2017   Pituitary adenoma ([place])    Pituitary macroadenoma ([facility])    progression into right cavernous sinus   Pneumonia    PONV (postoperative nausea and vomiting)    vomited after colostomy placement    Pre-diabetes    last a1c 5.6    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[facility])    Urinary incontinence    Venous stasis    edema    SOCIAL HX:  Social History   Tobacco Use   Smoking status: Former Smoker    Packs/day: 0.50    Years: 23.00    Pack years: 11.50    Types: Cigarettes    Quit date: 09/24/1992    Years since quitting: 27.2   Smokeless tobacco: Never Used  Substance Use Topics   Alcohol use: No    Comment: rare    ALLERGIES:  Allergies  Allergen Reactions   Penicillins Rash and Other (See Comments)    Has patient had a PCN reaction causing immediate rash, facial/tongue/throat swelling, SOB or  lightheadedness with hypotension: YES Has patient had a PCN reaction causing severe rash involving mucus membranes or skin necrosis: No Has patient had a PCN reaction that required hospitalization No Has patient had a PCN reaction occurring within the last 10 years: Yes  If all of the above answers are "NO", then may proceed with Cephalosporin use.     PERTINENT MEDICATIONS:  Outpatient Encounter Medications as of 01/05/2020  Medication Sig   ibuprofen (ADVIL) 200 MG tablet Take 200 mg by mouth 2 (two) times daily.   acetaminophen (TYLENOL) 500 MG tablet Take 1 tablet (500 mg total) by mouth every 6 (six) hours as needed for mild pain, moderate pain, fever or headache.   amLODipine (NORVASC) 10 MG tablet Take 1 tablet (10 mg total) by mouth daily.   BD DISP NEEDLES 18G X 1-1/2" MISC USE TO INJECT TESTOSTERONE INTO THE MUSCLE AS DIRECTED  EVERY 14 DAYS   escitalopram (LEXAPRO) 10 MG tablet TAKE 1 TABLET BY MOUTH IN THE MORNING   furosemide (LASIX) 20 MG tablet Take 1 tablet (20 mg total) by mouth daily as needed for fluid or edema.   guaiFENesin-dextromethorphan (ROBITUSSIN DM) 100-10 MG/5ML syrup Take 10 mLs by mouth every 4 (four) hours as needed for cough.   ketoconazole (NIZORAL) 2 % cream APPLY AS NEEDED TO RASH (Patient taking differently: Apply 1 application topically daily as needed for irritation (rash). )   levothyroxine (SYNTHROID) 25 MCG tablet TAKE 1 TABLET BY MOUTH ONCE DAILY BEFORE BREAKFAST (Patient taking differently: Take 25 mcg by mouth daily before breakfast. )   lisinopril-hydrochlorothiazide (ZESTORETIC) 20-25 MG tablet Take 1 tablet by mouth daily.   Melatonin 3 MG TABS Take 3 mg by mouth at bedtime as needed (for sleep).   Multiple Vitamin (MULTIVITAMIN WITH MINERALS) TABS tablet Take 1 tablet by mouth daily with breakfast.    SAFETY-LOK 3CC SYR 22GX1.5" 22G X 1-1/2" 3 ML MISC USE TO INJECT 1 ML OF TESTOSTERONE EVERY 14 DAYS   testosterone enanthate (DELATESTRYL) 200 MG/ML injection INJECT 1 ML INTRAMUSCULARLY  EVERY TWO WEEKS   traMADol (ULTRAM) 50 MG tablet TAKE 1 TABLET BY MOUTH EVERY 6 HOURS AS NEEDED (Patient taking differently: Take 50 mg by mouth every 6 (six) hours as needed for moderate pain. )   traZODone (DESYREL) 100 MG tablet TAKE 1 TABLET BY MOUTH AT BEDTIME   triamcinolone cream (KENALOG) 0.1 % Apply 1 application topically 2 (two) times daily as needed (for rash). Applies to face   No facility-administered encounter medications on file as of 01/05/2020.    PHYSICAL EXAM:   Pleasant male sitting up in his electric wheelchair. Soft voice is difficult to understand. A & O x 3. Spouse in attendance.  Julianne Handler, NP

## 2020-01-06 NOTE — Addendum Note (Signed)
Addended by: [name] on: 01/06/2020 01:26 PM   Modules accepted: Orders

## 2020-01-08 ENCOUNTER — Other Ambulatory Visit: Payer: Self-pay | Admitting: Family Medicine

## 2020-01-10 ENCOUNTER — Other Ambulatory Visit: Payer: Self-pay | Admitting: Family Medicine

## 2020-01-10 NOTE — Telephone Encounter (Signed)
Okay to refill both creams

## 2020-01-10 NOTE — Telephone Encounter (Signed)
Please advise if okay to fill  

## 2020-01-13 ENCOUNTER — Inpatient Hospital Stay (HOSPITAL_COMMUNITY)
Admission: EM | Admit: 2020-01-13 | Discharge: 2020-01-16 | DRG: 640 | Disposition: A | Discharge status: Home / Self Care | Payer: PPO | Attending: Internal Medicine | Admitting: Internal Medicine

## 2020-01-13 ENCOUNTER — Telehealth: Payer: Self-pay

## 2020-01-13 ENCOUNTER — Ambulatory Visit (HOSPITAL_COMMUNITY)
Admission: RE | Admit: 2020-01-13 | Discharge: 2020-01-13 | Disposition: A | Discharge status: Home / Self Care | Payer: PPO | Source: Ambulatory Visit | Attending: Oncology | Admitting: Oncology

## 2020-01-13 ENCOUNTER — Other Ambulatory Visit: Payer: Self-pay

## 2020-01-13 ENCOUNTER — Inpatient Hospital Stay: Payer: PPO | Attending: Radiation Oncology

## 2020-01-13 ENCOUNTER — Encounter (HOSPITAL_COMMUNITY): Payer: Self-pay

## 2020-01-13 DIAGNOSIS — Z8261 Family history of arthritis: Secondary | ICD-10-CM

## 2020-01-13 DIAGNOSIS — C7801 Secondary malignant neoplasm of right lung: Secondary | ICD-10-CM | POA: Insufficient documentation

## 2020-01-13 DIAGNOSIS — C649 Malignant neoplasm of unspecified kidney, except renal pelvis: Secondary | ICD-10-CM | POA: Diagnosis not present

## 2020-01-13 DIAGNOSIS — Z20822 Contact with and (suspected) exposure to covid-19: Secondary | ICD-10-CM | POA: Diagnosis not present

## 2020-01-13 DIAGNOSIS — Z87891 Personal history of nicotine dependence: Secondary | ICD-10-CM

## 2020-01-13 DIAGNOSIS — Z85528 Personal history of other malignant neoplasm of kidney: Secondary | ICD-10-CM | POA: Diagnosis not present

## 2020-01-13 DIAGNOSIS — Z79899 Other long term (current) drug therapy: Secondary | ICD-10-CM

## 2020-01-13 DIAGNOSIS — G8250 Quadriplegia, unspecified: Secondary | ICD-10-CM | POA: Diagnosis not present

## 2020-01-13 DIAGNOSIS — Z83438 Family history of other disorder of lipoprotein metabolism and other lipidemia: Secondary | ICD-10-CM

## 2020-01-13 DIAGNOSIS — I129 Hypertensive chronic kidney disease with stage 1 through stage 4 chronic kidney disease, or unspecified chronic kidney disease: Secondary | ICD-10-CM | POA: Diagnosis present

## 2020-01-13 DIAGNOSIS — N1831 Chronic kidney disease, stage 3a: Secondary | ICD-10-CM | POA: Diagnosis not present

## 2020-01-13 DIAGNOSIS — Z905 Acquired absence of kidney: Secondary | ICD-10-CM | POA: Diagnosis not present

## 2020-01-13 DIAGNOSIS — N179 Acute kidney failure, unspecified: Secondary | ICD-10-CM | POA: Diagnosis present

## 2020-01-13 DIAGNOSIS — N390 Urinary tract infection, site not specified: Secondary | ICD-10-CM | POA: Diagnosis present

## 2020-01-13 DIAGNOSIS — D352 Benign neoplasm of pituitary gland: Secondary | ICD-10-CM | POA: Diagnosis not present

## 2020-01-13 DIAGNOSIS — Z8049 Family history of malignant neoplasm of other genital organs: Secondary | ICD-10-CM

## 2020-01-13 DIAGNOSIS — I878 Other specified disorders of veins: Secondary | ICD-10-CM | POA: Diagnosis not present

## 2020-01-13 DIAGNOSIS — E039 Hypothyroidism, unspecified: Secondary | ICD-10-CM | POA: Diagnosis present

## 2020-01-13 DIAGNOSIS — I10 Essential (primary) hypertension: Secondary | ICD-10-CM | POA: Diagnosis not present

## 2020-01-13 DIAGNOSIS — Z800 Family history of malignant neoplasm of digestive organs: Secondary | ICD-10-CM

## 2020-01-13 DIAGNOSIS — Z933 Colostomy status: Secondary | ICD-10-CM

## 2020-01-13 DIAGNOSIS — Z8719 Personal history of other diseases of the digestive system: Secondary | ICD-10-CM

## 2020-01-13 DIAGNOSIS — R7303 Prediabetes: Secondary | ICD-10-CM | POA: Diagnosis not present

## 2020-01-13 DIAGNOSIS — E875 Hyperkalemia: Principal | ICD-10-CM | POA: Diagnosis present

## 2020-01-13 DIAGNOSIS — R918 Other nonspecific abnormal finding of lung field: Secondary | ICD-10-CM | POA: Diagnosis not present

## 2020-01-13 DIAGNOSIS — R1900 Intra-abdominal and pelvic swelling, mass and lump, unspecified site: Secondary | ICD-10-CM | POA: Diagnosis not present

## 2020-01-13 DIAGNOSIS — I69365 Other paralytic syndrome following cerebral infarction, bilateral: Secondary | ICD-10-CM

## 2020-01-13 DIAGNOSIS — Z888 Allergy status to other drugs, medicaments and biological substances status: Secondary | ICD-10-CM | POA: Diagnosis not present

## 2020-01-13 DIAGNOSIS — E785 Hyperlipidemia, unspecified: Secondary | ICD-10-CM | POA: Diagnosis present

## 2020-01-13 DIAGNOSIS — E872 Acidosis: Secondary | ICD-10-CM | POA: Diagnosis not present

## 2020-01-13 DIAGNOSIS — Z8249 Family history of ischemic heart disease and other diseases of the circulatory system: Secondary | ICD-10-CM

## 2020-01-13 DIAGNOSIS — C7802 Secondary malignant neoplasm of left lung: Secondary | ICD-10-CM | POA: Insufficient documentation

## 2020-01-13 DIAGNOSIS — D631 Anemia in chronic kidney disease: Secondary | ICD-10-CM | POA: Diagnosis present

## 2020-01-13 DIAGNOSIS — Z9049 Acquired absence of other specified parts of digestive tract: Secondary | ICD-10-CM | POA: Diagnosis not present

## 2020-01-13 DIAGNOSIS — Z880 Allergy status to penicillin: Secondary | ICD-10-CM | POA: Diagnosis not present

## 2020-01-13 DIAGNOSIS — Z811 Family history of alcohol abuse and dependence: Secondary | ICD-10-CM

## 2020-01-13 DIAGNOSIS — Z808 Family history of malignant neoplasm of other organs or systems: Secondary | ICD-10-CM | POA: Diagnosis not present

## 2020-01-13 DIAGNOSIS — Z03818 Encounter for observation for suspected exposure to other biological agents ruled out: Secondary | ICD-10-CM | POA: Diagnosis not present

## 2020-01-13 DIAGNOSIS — I452 Bifascicular block: Secondary | ICD-10-CM | POA: Diagnosis not present

## 2020-01-13 DIAGNOSIS — Z823 Family history of stroke: Secondary | ICD-10-CM

## 2020-01-13 DIAGNOSIS — Z79890 Hormone replacement therapy (postmenopausal): Secondary | ICD-10-CM

## 2020-01-13 DIAGNOSIS — Z923 Personal history of irradiation: Secondary | ICD-10-CM | POA: Insufficient documentation

## 2020-01-13 DIAGNOSIS — Z8711 Personal history of peptic ulcer disease: Secondary | ICD-10-CM

## 2020-01-13 LAB — COMPREHENSIVE METABOLIC PANEL
ALT: 18 U/L (ref 0–44)
AST: 16 U/L (ref 15–41)
Albumin: 3.9 g/dL (ref 3.5–5.0)
Alkaline Phosphatase: 67 U/L (ref 38–126)
Anion gap: 10 (ref 5–15)
BUN: 43 mg/dL — ABNORMAL HIGH (ref 8–23)
CO2: 18 mmol/L — ABNORMAL LOW (ref 22–32)
Calcium: 9.3 mg/dL (ref 8.9–10.3)
Chloride: 106 mmol/L (ref 98–111)
Creatinine, Ser: 1.52 mg/dL — ABNORMAL HIGH (ref 0.61–1.24)
GFR calc Af Amer: 55 mL/min — ABNORMAL LOW (ref >60)
GFR calc non Af Amer: 47 mL/min — ABNORMAL LOW (ref >60)
Glucose, Bld: 87 mg/dL (ref 70–99)
Potassium: 7.2 mmol/L (ref 3.5–5.1)
Sodium: 134 mmol/L — ABNORMAL LOW (ref 135–145)
Total Bilirubin: 0.6 mg/dL (ref 0.3–1.2)
Total Protein: 8.4 g/dL — ABNORMAL HIGH (ref 6.5–8.1)

## 2020-01-13 LAB — CBC
HCT: 41 % (ref 39.0–52.0)
Hemoglobin: 12.6 g/dL — ABNORMAL LOW (ref 13.0–17.0)
MCH: 28.9 pg (ref 26.0–34.0)
MCHC: 30.7 g/dL (ref 30.0–36.0)
MCV: 94 fL (ref 80.0–100.0)
Platelets: 265 10*3/uL (ref 150–400)
RBC: 4.36 MIL/uL (ref 4.22–5.81)
RDW: 15.4 % (ref 11.5–15.5)
WBC: 11.6 10*3/uL — ABNORMAL HIGH (ref 4.0–10.5)
nRBC: 0 % (ref 0.0–0.2)

## 2020-01-13 LAB — CBC WITH DIFFERENTIAL (CANCER CENTER ONLY)
Abs Immature Granulocytes: 0.1 10*3/uL — ABNORMAL HIGH (ref 0.00–0.07)
Basophils Absolute: 0.1 10*3/uL (ref 0.0–0.1)
Basophils Relative: 1 %
Eosinophils Absolute: 0.4 10*3/uL (ref 0.0–0.5)
Eosinophils Relative: 4 %
HCT: 35.9 % — ABNORMAL LOW (ref 39.0–52.0)
Hemoglobin: 11.5 g/dL — ABNORMAL LOW (ref 13.0–17.0)
Immature Granulocytes: 1 %
Lymphocytes Relative: 11 %
Lymphs Abs: 1.2 10*3/uL (ref 0.7–4.0)
MCH: 29.2 pg (ref 26.0–34.0)
MCHC: 32 g/dL (ref 30.0–36.0)
MCV: 91.1 fL (ref 80.0–100.0)
Monocytes Absolute: 0.9 10*3/uL (ref 0.1–1.0)
Monocytes Relative: 9 %
Neutro Abs: 7.8 10*3/uL — ABNORMAL HIGH (ref 1.7–7.7)
Neutrophils Relative %: 74 %
Platelet Count: 258 10*3/uL (ref 150–400)
RBC: 3.94 MIL/uL — ABNORMAL LOW (ref 4.22–5.81)
RDW: 15.3 % (ref 11.5–15.5)
WBC Count: 10.6 10*3/uL — ABNORMAL HIGH (ref 4.0–10.5)
nRBC: 0 % (ref 0.0–0.2)

## 2020-01-13 LAB — CMP (CANCER CENTER ONLY)
ALT: 16 U/L (ref 0–44)
AST: 12 U/L — ABNORMAL LOW (ref 15–41)
Albumin: 3.3 g/dL — ABNORMAL LOW (ref 3.5–5.0)
Alkaline Phosphatase: 69 U/L (ref 38–126)
Anion gap: 8 (ref 5–15)
BUN: 41 mg/dL — ABNORMAL HIGH (ref 8–23)
CO2: 16 mmol/L — ABNORMAL LOW (ref 22–32)
Calcium: 9.5 mg/dL (ref 8.9–10.3)
Chloride: 109 mmol/L (ref 98–111)
Creatinine: 1.6 mg/dL — ABNORMAL HIGH (ref 0.61–1.24)
GFR, Est AFR Am: 52 mL/min — ABNORMAL LOW (ref >60)
GFR, Estimated: 45 mL/min — ABNORMAL LOW (ref >60)
Glucose, Bld: 88 mg/dL (ref 70–99)
Potassium: 6.9 mmol/L (ref 3.5–5.1)
Sodium: 133 mmol/L — ABNORMAL LOW (ref 135–145)
Total Bilirubin: 0.3 mg/dL (ref 0.3–1.2)
Total Protein: 7.9 g/dL (ref 6.5–8.1)

## 2020-01-13 LAB — URINALYSIS, ROUTINE W REFLEX MICROSCOPIC
Bacteria, UA: NONE SEEN
Bilirubin Urine: NEGATIVE
Glucose, UA: NEGATIVE mg/dL
Ketones, ur: NEGATIVE mg/dL
Nitrite: POSITIVE — AB
Protein, ur: NEGATIVE mg/dL
Specific Gravity, Urine: 1.018 (ref 1.005–1.030)
WBC, UA: >50 WBC/hpf — ABNORMAL HIGH (ref 0–5)
pH: 5 (ref 5.0–8.0)

## 2020-01-13 LAB — NA AND K (SODIUM & POTASSIUM), RAND UR
Potassium Urine: 27 mmol/L
Sodium, Ur: 78 mmol/L

## 2020-01-13 LAB — SARS CORONAVIRUS 2 BY RT PCR (HOSPITAL ORDER, PERFORMED IN ~~LOC~~ HOSPITAL LAB): SARS Coronavirus 2: NEGATIVE

## 2020-01-13 MED ORDER — ALBUTEROL SULFATE (2.5 MG/3ML) 0.083% IN NEBU
10.0000 mg | INHALATION_SOLUTION | Freq: Once | RESPIRATORY_TRACT | Status: DC
  Filled 2020-01-13 (×2): qty 12

## 2020-01-13 MED ORDER — SODIUM CHLORIDE 0.9 % IV SOLN: INTRAVENOUS | Status: DC

## 2020-01-13 MED ORDER — LEVOTHYROXINE SODIUM 25 MCG PO TABS
25.0000 ug | ORAL_TABLET | Freq: Every day | ORAL | Status: DC
  Administered 2020-01-15 – 2020-01-16 (×2): 25 ug via ORAL
  Filled 2020-01-13 (×2): qty 1

## 2020-01-13 MED ORDER — SODIUM CHLORIDE (PF) 0.9 % IJ SOLN
INTRAMUSCULAR | Status: AC
  Filled 2020-01-13: qty 50

## 2020-01-13 MED ORDER — SODIUM CHLORIDE 0.9 % IV BOLUS
500.0000 mL | Freq: Once | INTRAVENOUS | Status: AC
  Administered 2020-01-13: 500 mL via INTRAVENOUS

## 2020-01-13 MED ORDER — ONDANSETRON HCL 4 MG/2ML IJ SOLN: 4.0000 mg | Freq: Four times a day (QID) | INTRAMUSCULAR | Status: DC | PRN

## 2020-01-13 MED ORDER — IOHEXOL 300 MG/ML  SOLN
80.0000 mL | Freq: Once | INTRAMUSCULAR | Status: AC | PRN
  Administered 2020-01-13: 80 mL via INTRAVENOUS

## 2020-01-13 MED ORDER — SODIUM CHLORIDE 0.9% FLUSH
3.0000 mL | Freq: Once | INTRAVENOUS | Status: AC
  Administered 2020-01-13: 3 mL via INTRAVENOUS

## 2020-01-13 MED ORDER — ONDANSETRON HCL 4 MG PO TABS: 4.0000 mg | ORAL_TABLET | Freq: Four times a day (QID) | ORAL | Status: DC | PRN

## 2020-01-13 MED ORDER — INSULIN ASPART 100 UNIT/ML IV SOLN
10.0000 [IU] | Freq: Once | INTRAVENOUS | Status: AC
  Administered 2020-01-13: 10 [IU] via INTRAVENOUS
  Filled 2020-01-13: qty 0.1

## 2020-01-13 MED ORDER — TRAZODONE HCL 100 MG PO TABS
100.0000 mg | ORAL_TABLET | Freq: Every day | ORAL | Status: DC
  Administered 2020-01-14 – 2020-01-15 (×2): 100 mg via ORAL
  Filled 2020-01-13 (×2): qty 1

## 2020-01-13 MED ORDER — CALCIUM GLUCONATE-NACL 1-0.675 GM/50ML-% IV SOLN
1.0000 g | Freq: Once | INTRAVENOUS | Status: AC
  Administered 2020-01-13: 1000 mg via INTRAVENOUS
  Filled 2020-01-13: qty 50

## 2020-01-13 MED ORDER — DEXTROSE 50 % IV SOLN
1.0000 | Freq: Once | INTRAVENOUS | Status: AC
  Administered 2020-01-13: 50 mL via INTRAVENOUS
  Filled 2020-01-13: qty 50

## 2020-01-13 MED ORDER — HYDRALAZINE HCL 20 MG/ML IJ SOLN: 10.0000 mg | INTRAMUSCULAR | Status: DC | PRN

## 2020-01-13 MED ORDER — AMLODIPINE BESYLATE 10 MG PO TABS
10.0000 mg | ORAL_TABLET | Freq: Every day | ORAL | Status: DC
  Administered 2020-01-14 – 2020-01-16 (×3): 10 mg via ORAL
  Filled 2020-01-13: qty 1
  Filled 2020-01-13: qty 2
  Filled 2020-01-13: qty 1

## 2020-01-13 MED ORDER — ESCITALOPRAM OXALATE 10 MG PO TABS
10.0000 mg | ORAL_TABLET | Freq: Every day | ORAL | Status: DC
  Administered 2020-01-14 – 2020-01-16 (×3): 10 mg via ORAL
  Filled 2020-01-13 (×3): qty 1

## 2020-01-13 MED ORDER — ALBUTEROL (5 MG/ML) CONTINUOUS INHALATION SOLN
10.0000 mg/h | INHALATION_SOLUTION | RESPIRATORY_TRACT | Status: DC
  Administered 2020-01-13: 10 mg/h via RESPIRATORY_TRACT

## 2020-01-13 MED ORDER — ENOXAPARIN SODIUM 40 MG/0.4ML ~~LOC~~ SOLN
40.0000 mg | Freq: Every day | SUBCUTANEOUS | Status: DC
  Administered 2020-01-14 – 2020-01-15 (×2): 40 mg via SUBCUTANEOUS
  Filled 2020-01-13 (×2): qty 0.4

## 2020-01-13 MED ORDER — SODIUM ZIRCONIUM CYCLOSILICATE 10 G PO PACK
10.0000 g | PACK | Freq: Once | ORAL | Status: DC
  Filled 2020-01-13: qty 1

## 2020-01-13 NOTE — H&P (Signed)
History and Physical    [name]:[id number] DOB: [birth year] DOA: 01/13/2020  PCP: [name], MD  Patient coming from: Home.  Chief Complaint: Abnormal labs.  HPI: Austin [name] is a 64 y.o. male with history of metastatic renal cell carcinoma, CVA with quadriplegia, history of colostomy bag placement for the surgery for diverticulitis, history of pituitary adenoma anemia hypertension had a routine follow-up with his oncologist when patient had lab work was done and showed hyperkalemia.  Patient was instructed to come to the ER.  Patient denies any nausea vomiting or increased colostomy output or any change in the medications.  Patient is on lisinopril.  Patient was admitted in March of this year at that time patient was found to be septic also was found to be hyperkalemic at that time.  ED Course: In the ER repeat labs show potassium of 7.2 creatinine 1.5 which is increased from 1.32 months ago.  EKG shows RBBB with peaked T waves.  Labs show hemoglobin of 12.6 Covid test negative UA shows features concerning for UTI.  Patient was given calcium gluconate D50 IV insulin and Lokelma admitted for hyperkalemia.  Review of Systems: As per HPI, rest all negative.   Past Medical History:  Diagnosis Date  . 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 C. difficile colitis 02/08/2017  . Cerebrovascular accident ([place]) 1994   hx of brainstem stroke, residual diminished lung capacity  . Chronic kidney disease    Left Renal Mass  . 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 Diverticulitis   . Duodenal ulcer   . GIB (gastrointestinal bleeding) 05/28/2017  . Hx of colonic polyps   . Hypertension    has been off BP meds since GI bleed was thought to have been caused by previously prescribed lisinopril   . Hypogonadism   . Intra-abdominal abscess ([place]) 01/23/2017  . Iron deficiency anemia   . met renal ca to peritoneal and retroperitoneal dx'd  12/2011   lt nephrectomy  . Neuromuscular disorder ([place])    quadraplegic  . Open abdominal incision with drainage 01/26/2017  . Pituitary adenoma ([place])   . 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 Pneumonia   . PONV (postoperative nausea and vomiting)    vomited after colostomy placement   . Pre-diabetes    last a1c 5.6   . 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[place])   . Urinary incontinence   . Venous stasis    edema    Past Surgical History:  Procedure Laterality Date  . CHOLECYSTECTOMY    . COLON RESECTION N/A 05/04/2017   Procedure: LAPAROSCOPIC SIGMOID COLON RESECTION WITH END COLSOTOMY ERAS PATHWAY;  Surgeon: Alphonsa Overall, MD;  Location: [facility];  Service: General;  Laterality: N/A;  . COLONOSCOPY  02/27/2012   Procedure: COLONOSCOPY;  Surgeon: Jerene Bears, MD;  Location: [facility];  Service: Gastroenterology;  Laterality: N/A;  . COLONOSCOPY N/A 05/31/2017   Procedure: COLONOSCOPY;  Surgeon: Milus Banister, MD;  Location: [facility];  Service: Endoscopy;  Laterality: N/A;  . COLOSTOMY TAKEDOWN N/A 08/20/2017   Procedure: LAPAROSCOPIC ASSISTED REVISION END COLOSTOMY AND ENTEROLYSIS OF ADHESIONS;  Surgeon: Alphonsa Overall, MD;  Location: [facility];  Service: General;  Laterality: N/A;  ERAS PATHWAY  . ESOPHAGOGASTRODUODENOSCOPY N/A 05/31/2017   Procedure: ESOPHAGOGASTRODUODENOSCOPY (EGD);  Surgeon: Milus Banister, MD;  Location: [facility];  Service: Endoscopy;  Laterality: N/A;  . gamma knife  radiation surgery  05/2010   for pituitary  . IR RADIOLOGIST EVAL & MGMT  02/12/2017  . IR RADIOLOGIST EVAL & MGMT  03/17/2017  . IR RADIOLOGIST EVAL & MGMT  04/14/2017  . IR RADIOLOGIST EVAL & MGMT  02/05/2017  . PITUITARY SURGERY  2007   nose approach  . ROBOT ASSISTED LAPAROSCOPIC NEPHRECTOMY  04/23/2012   Procedure: ROBOTIC ASSISTED LAPAROSCOPIC NEPHRECTOMY;  Surgeon: Alexis Frock, MD;  Location: [facility];  Service: Urology;  Laterality: Left;  radical  . stomach peg   02/1993   removed 5-6 years later  . TRACHEOSTOMY TUBE PLACEMENT  02/1993   removed 04/1993  . UMBILICAL HERNIA REPAIR  04/23/2012   Procedure: HERNIA REPAIR UMBILICAL ADULT;  Surgeon: Alexis Frock, MD;  Location: [facility];  Service: Urology;;  . [name]  . vocal cord surgery     injected with collagen, then fat from stomach to improve speech s/p stroke     reports that he quit smoking about 27 years ago. His smoking use included cigarettes. He has a 11.50 pack-year smoking history. He has never used smokeless tobacco. He reports that he does not drink alcohol or use drugs.  Allergies  Allergen Reactions  . Penicillins Rash and Other (See Comments)    Has patient had a PCN reaction causing immediate rash, facial/tongue/throat swelling, SOB or lightheadedness with hypotension: YES Has patient had a PCN reaction causing severe rash involving mucus membranes or skin necrosis: No Has patient had a PCN reaction that required hospitalization No Has patient had a PCN reaction occurring within the last 10 years: Yes  If all of the above answers are "NO", then may proceed with Cephalosporin use.    Family History  Problem Relation Age of Onset  . Alcohol abuse Father   . Throat cancer Father   . Esophageal cancer Father 44  . Arthritis Mother   . Hyperlipidemia Mother   . Uterine cancer Mother 4  . Colon cancer Brother   . Arthritis Maternal Grandmother   . Hypertension Brother   . Stroke Paternal Grandmother     Prior to Admission medications   Medication Sig Start Date End Date Taking? Authorizing Provider  acetaminophen (TYLENOL) 500 MG tablet Take 1 tablet (500 mg total) by mouth every 6 (six) hours as needed for mild pain, moderate pain, fever or headache. 06/02/17  Yes Florencia Reasons, MD  amLODipine (NORVASC) 10 MG tablet Take 1 tablet (10 mg total) by mouth daily. 09/30/19  Yes [name], [name], MD  escitalopram (LEXAPRO) 10 MG tablet TAKE 1 TABLET BY MOUTH IN THE  MORNING Patient taking differently: Take 10 mg by mouth daily.  12/19/19  Yes [name], [name], MD  furosemide (LASIX) 20 MG tablet Take 1 tablet (20 mg total) by mouth daily as needed for fluid or edema. 11/04/19  Yes [name], [name], MD  ibuprofen (ADVIL) 200 MG tablet Take 200 mg by mouth 2 (two) times daily as needed for headache or moderate pain.    Yes [provider]  ketoconazole (NIZORAL) 2 % cream APPLY AS NEEDED TO RASH Patient taking differently: Apply 1 application topically daily as needed for irritation.  01/10/20  Yes [name], [name], MD  levothyroxine (SYNTHROID) 25 MCG tablet TAKE 1 TABLET BY MOUTH ONCE DAILY BEFORE BREAKFAST Patient taking differently: Take 25 mcg by mouth daily before breakfast.  01/10/20  Yes [name], [name], MD  lisinopril-hydrochlorothiazide (ZESTORETIC) 20-25 MG tablet Take 1 tablet by mouth daily. 11/23/19  Yes [name], [name], MD  Multiple Vitamin (MULTIVITAMIN WITH MINERALS) TABS tablet Take 1 tablet by mouth daily with breakfast.    Yes [provider]  testosterone enanthate (DELATESTRYL) 200 MG/ML injection INJECT 1 ML INTRAMUSCULARLY  EVERY TWO WEEKS Patient taking differently: Inject 200 mg into the muscle every 14 (fourteen) days.  07/22/19  Yes [name], [name], MD  traMADol (ULTRAM) 50 MG tablet TAKE 1 TABLET BY MOUTH EVERY 6 HOURS AS NEEDED Patient taking differently: Take 50 mg by mouth every 6 (six) hours as needed for moderate pain.  09/01/19  Yes Wyatt Portela, MD  traZODone (DESYREL) 100 MG tablet TAKE 1 TABLET BY MOUTH AT BEDTIME Patient taking differently: Take 100 mg by mouth at bedtime.  12/19/19  Yes [name], [name], MD  triamcinolone cream (KENALOG) 0.1 % APPLY TO AFFECTED AREA 2 TIMES DAILY AS NEEDED Patient taking differently: Apply 1 application topically daily as needed (for rash).  01/10/20  Yes [name], [name], MD  BD DISP NEEDLES 18G X 1-1/2" MISC USE TO INJECT TESTOSTERONE INTO THE MUSCLE AS  DIRECTED EVERY 14 DAYS 10/25/19   [name], [name], MD  guaiFENesin-dextromethorphan (ROBITUSSIN DM) 100-10 MG/5ML syrup Take 10 mLs by mouth every 4 (four) hours as needed for cough. Patient not taking: Reported on 01/13/2020 10/20/19   Rai, Vernelle Emerald, MD  SAFETY-LOK 3CC SYR 754 [id number] 7583.5" 22G X 1-1/2" 3 ML MISC USE TO INJECT 1 ML OF TESTOSTERONE EVERY 14 DAYS 10/25/19   [name], [name], MD    Physical Exam: Constitutional: Moderately built and nourished. Vitals:   01/13/20 2100 01/13/20 2111 01/13/20 2130 01/13/20 2211  BP: (!) 145/64 (!) 145/64 (!) 149/60 (!) 130/59  Pulse: 73 73 78 93  Resp: '15 15 13 13  ' Temp:      TempSrc:      SpO2: 99% 98% 98% 96%  Weight:      Height:       Eyes: Anicteric no pallor. ENMT: No discharge from the ears eyes nose or mouth. Neck: No mass felt.  No neck rigidity. Respiratory: No rhonchi or crepitations. Cardiovascular: S1-S2 heard. Abdomen: Soft nontender bowel sounds present.  Colostomy bag in place. Musculoskeletal: No edema. Skin: No rash. Neurologic: Alert awake oriented time place and person.  Patient is quadriplegic. Psychiatric: Appears normal per normal affect.   Labs on Admission: I have personally reviewed following labs and imaging studies  CBC: Recent Labs  Lab 01/13/20 1329 01/13/20 1654  WBC 10.6* 11.6*  NEUTROABS 7.8*  --   HGB 11.5* 12.6*  HCT 35.9* 41.0  MCV 91.1 94.0  PLT 258 100   Basic Metabolic Panel: Recent Labs  Lab 01/13/20 1329 01/13/20 1654  NA 133* 134*  K 6.9* 7.2*  CL 109 106  CO2 16* 18*  GLUCOSE 88 87  BUN 41* 43*  CREATININE 1.60* 1.52*  CALCIUM 9.5 9.3   GFR: Estimated Creatinine Clearance: 53.2 mL/min (A) (by C-G formula based on SCr of 1.52 mg/dL (H)). Liver Function Tests: Recent Labs  Lab 01/13/20 1329 01/13/20 1654  AST 12* 16  ALT 16 18  ALKPHOS 69 67  BILITOT 0.3 0.6  PROT 7.9 8.4*  ALBUMIN 3.3* 3.9   No results for input(s): LIPASE, AMYLASE in the last 168 hours. No  results for input(s): AMMONIA in the last 168 hours. Coagulation Profile: No results for input(s): INR, PROTIME in the last 168 hours. Cardiac Enzymes: No results for input(s): CKTOTAL, CKMB, CKMBINDEX, TROPONINI in the last 168  hours. BNP (last 3 results) No results for input(s): PROBNP in the last 8760 hours. HbA1C: No results for input(s): HGBA1C in the last 72 hours. CBG: No results for input(s): GLUCAP in the last 168 hours. Lipid Profile: No results for input(s): CHOL, HDL, LDLCALC, TRIG, CHOLHDL, LDLDIRECT in the last 72 hours. Thyroid Function Tests: No results for input(s): TSH, T4TOTAL, FREET4, T3FREE, THYROIDAB in the last 72 hours. Anemia Panel: No results for input(s): VITAMINB12, FOLATE, FERRITIN, TIBC, IRON, RETICCTPCT in the last 72 hours. Urine analysis:    Component Value Date/Time   COLORURINE YELLOW 01/13/2020 1813   APPEARANCEUR HAZY (A) 01/13/2020 1813   LABSPEC 1.018 01/13/2020 1813   PHURINE 5.0 01/13/2020 1813   GLUCOSEU NEGATIVE 01/13/2020 1813   HGBUR SMALL (A) 01/13/2020 1813   HGBUR negative 02/01/2010 1022   BILIRUBINUR NEGATIVE 01/13/2020 1813   BILIRUBINUR Negative 10/13/2018 1014   KETONESUR NEGATIVE 01/13/2020 1813   PROTEINUR NEGATIVE 01/13/2020 1813   UROBILINOGEN 0.2 10/13/2018 1014   UROBILINOGEN 0.2 02/01/2010 1022   NITRITE POSITIVE (A) 01/13/2020 1813   LEUKOCYTESUR LARGE (A) 01/13/2020 1813   Sepsis Labs: '@LABRCNTIP' (procalcitonin:4,lacticidven:4) ) Recent Results (from the past 240 hour(s))  SARS Coronavirus 2 by RT PCR (hospital order, performed in [facility] hospital lab) Nasopharyngeal Nasopharyngeal Swab     Status: None   Collection Time: 01/13/20  9:37 PM   Specimen: Nasopharyngeal Swab  Result Value Ref Range Status   SARS Coronavirus 2 NEGATIVE NEGATIVE Final    Comment: (NOTE) SARS-CoV-2 target nucleic acids are NOT DETECTED. The SARS-CoV-2 RNA is generally detectable in upper and lower respiratory specimens during  the acute phase of infection. The lowest concentration of SARS-CoV-2 viral copies this assay can detect is 250 copies / mL. A negative result does not preclude SARS-CoV-2 infection and should not be used as the sole basis for treatment or other patient management decisions.  A negative result may occur with improper specimen collection / handling, submission of specimen other than nasopharyngeal swab, presence of viral mutation(s) within the areas targeted by this assay, and inadequate number of viral copies (<250 copies / mL). A negative result must be combined with clinical observations, patient history, and epidemiological information. Fact Sheet for Patients:   StrictlyIdeas.no Fact Sheet for Healthcare Providers: BankingDealers.co.za This test is not yet approved or cleared  by the [place] FDA and has been authorized for detection and/or diagnosis of SARS-CoV-2 by FDA under an Emergency Use Authorization (EUA).  This EUA will remain in effect (meaning this test can be used) for the duration of the COVID-19 declaration under Section 564(b)(1) of the Act, 21 U.S.C. section 360bbb-3(b)(1), unless the authorization is terminated or revoked sooner. Performed at [facility], [place] [address] [place], [place] [address]      Radiological Exams on Admission: No results found.  EKG: Independently reviewed.  Normal sinus rhythm with RBBB and peaked T waves.  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 Severe hypokalemia with acute renal failure the setting of using lisinopril and hydrochlorothiazide which I will hold for now.  Patient likely need to be discontinued from lisinopril.  Patient has received calcium gluconate D50  IV insulin albuterol nebulizer and I ordered Lokelma.  Repeat metabolic panel in a.m. patient is receiving fluids. 2. Possible UTI on ceftriaxone  follow urine cultures. 3. Hypertension on amlodipine and since patient's lisinopril hydrochlorothiazide have been discontinued we will keep patient on as needed IV hydralazine follow blood pressure trends. 4. History of stroke with quadriplegia. 5. History of metastatic renal cell carcinoma being followed by Dr. Alen Blew patient's oncologist. 6. Hypothyroidism on Synthroid. 7. History of pituitary adenoma.   DVT prophylaxis: Lovenox. Code Status: Full code. Family Communication: Patient's wife. Disposition Plan: Home. Consults called: None. Admission status: Inpatient.   Rise Patience MD [facility] Pager [phone].  If 7PM-7AM, please contact night-coverage [email or web address] Password [email or web address]  01/13/2020, 11:00 PM

## 2020-01-13 NOTE — ED Provider Notes (Signed)
[facility] DEPT Provider Note   CSN: [id number] Arrival date & time: 01/13/20  1626     History Chief Complaint  Patient presents with   Abnormal Lab    Austin [name] is a 64 y.o. male.  HPI Patient was being seen by oncology today for routine follow-up of metastatic kidney cancer, when it was discovered that he was hyperkalemic.  He proceeded to get scheduled CT imaging done, then came here afterwards.  He has not had any illnesses recently.  He has a chronic cough and occasionally produces sputum.  He is quadriparetic, from a stroke, remotely.  There have been no recent illnesses including fever, vomiting, diarrhea, or change in urine habits or symptoms.  He is here with his wife who helps to give answers because the patient has trouble talking loudly enough to be heard.  There are no other known modifying factors.    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[place])    Diverticulitis    Duodenal ulcer    GIB (gastrointestinal bleeding) 05/28/2017   Hx of colonic polyps    Hypertension    has been off BP meds since GI bleed was thought to have been caused by previously prescribed lisinopril    Hypogonadism    Intra-abdominal abscess ([place]) 01/23/2017   Iron deficiency anemia    met renal ca to peritoneal and retroperitoneal dx'd 12/2011   lt nephrectomy   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   Open abdominal incision with drainage 01/26/2017   Pituitary adenoma ([place])    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   Pneumonia    PONV (postoperative nausea and vomiting)    vomited after colostomy placement     Pre-diabetes    last a1c 5.6    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[place])    Urinary incontinence    Venous stasis    edema    Patient Active Problem List   Diagnosis Date Noted   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   Dehydration 10/18/2019   Sepsis due to pneumonia ([place]) 10/18/2019   CAP (community acquired pneumonia) 10/17/2019   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   Hypothyroid 07/14/2018   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   AKI (acute kidney injury) ([place])    Colostomy in place  05/30/2017   Blood loss anemia 05/29/2017   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/28/2016   Perforation of sigmoid colon due to diverticulitis s/p colostomy 9/18 09/27/2016   Fatigue 05/23/2015   Hyperlipidemia 10/17/2013   Renal carcinoma metastatic with carcinomatosis 11/09/2012   Type 2 diabetes mellitus ([place]) 03/31/2012   Anemia associated with acute blood loss 02/28/2012   Abdominal pain, other specified site 02/27/2012   Low testosterone 06/21/2010   WEAKNESS 04/03/2010   FACIAL WEAKNESS 04/03/2010   EDEMA 01/15/2009   Pituitary tumor 01/15/2009   Essential hypertension 10/16/2008   Neurogenic bladder 10/16/2008   OTHER SEBORRHEIC DERMATITIS 10/16/2008   COLONIC POLYPS, HX OF 10/16/2008    Past Surgical History:  Procedure Laterality Date   CHOLECYSTECTOMY     COLON RESECTION N/A 05/04/2017  Procedure: LAPAROSCOPIC SIGMOID COLON RESECTION WITH END COLSOTOMY ERAS PATHWAY;  Surgeon: Alphonsa Overall, MD;  Location: [facility];  Service: General;  Laterality: N/A;   COLONOSCOPY  02/27/2012   Procedure: COLONOSCOPY;  Surgeon: Jerene Bears, MD;  Location: [facility];  Service: Gastroenterology;  Laterality: N/A;   COLONOSCOPY N/A 05/31/2017   Procedure: COLONOSCOPY;  Surgeon: Milus Banister, MD;  Location: [facility];  Service: Endoscopy;  Laterality: N/A;   COLOSTOMY TAKEDOWN N/A  08/20/2017   Procedure: LAPAROSCOPIC ASSISTED REVISION END COLOSTOMY AND ENTEROLYSIS OF ADHESIONS;  Surgeon: Alphonsa Overall, MD;  Location: [facility];  Service: General;  Laterality: N/A;  ERAS PATHWAY   ESOPHAGOGASTRODUODENOSCOPY N/A 05/31/2017   Procedure: ESOPHAGOGASTRODUODENOSCOPY (EGD);  Surgeon: Milus Banister, MD;  Location: [facility];  Service: Endoscopy;  Laterality: N/A;   gamma knife radiation surgery  05/2010   for pituitary   IR RADIOLOGIST EVAL & MGMT  02/12/2017   IR RADIOLOGIST EVAL & MGMT  03/17/2017   IR RADIOLOGIST EVAL & MGMT  04/14/2017   IR RADIOLOGIST EVAL & MGMT  02/05/2017   PITUITARY SURGERY  2007   nose approach   ROBOT ASSISTED LAPAROSCOPIC NEPHRECTOMY  04/23/2012   Procedure: ROBOTIC ASSISTED LAPAROSCOPIC NEPHRECTOMY;  Surgeon: Alexis Frock, MD;  Location: [facility];  Service: Urology;  Laterality: Left;  radical   stomach peg  02/1993   removed 5-6 years later   [place]  02/1993   removed [id number]   UMBILICAL HERNIA REPAIR  04/23/2012   Procedure: HERNIA REPAIR UMBILICAL ADULT;  Surgeon: Alexis Frock, MD;  Location: [facility];  Service: Urology;;   VASECTOMY  1984   vocal cord surgery     injected with collagen, then fat from stomach to improve speech s/p stroke       Family History  Problem Relation Age of Onset   Alcohol abuse Father    Throat cancer Father    Esophageal cancer Father 28   Arthritis Mother    Hyperlipidemia Mother    Uterine cancer Mother 51   Colon cancer Brother    Arthritis Maternal Grandmother    Hypertension Brother    Stroke Paternal Grandmother     Social History   Tobacco Use   Smoking status: Former Smoker    Packs/day: 0.50    Years: 23.00    Pack years: 11.50    Types: Cigarettes    Quit date: 09/24/1992    Years since quitting: 27.3   Smokeless tobacco: Never Used  Substance Use Topics   Alcohol use: No    Comment: rare   Drug use: No    Home Medications Prior to  Admission medications   Medication Sig Start Date End Date Taking? Authorizing Provider  acetaminophen (TYLENOL) 500 MG tablet Take 1 tablet (500 mg total) by mouth every 6 (six) hours as needed for mild pain, moderate pain, fever or headache. 06/02/17  Yes Florencia Reasons, MD  amLODipine (NORVASC) 10 MG tablet Take 1 tablet (10 mg total) by mouth daily. 09/30/19  Yes [name], [name], MD  escitalopram (LEXAPRO) 10 MG tablet TAKE 1 TABLET BY MOUTH IN THE MORNING Patient taking differently: Take 10 mg by mouth daily.  12/19/19  Yes [name], [name], MD  furosemide (LASIX) 20 MG tablet Take 1 tablet (20 mg total) by mouth daily as needed for fluid or edema. 11/04/19  Yes [name], [name], MD  ibuprofen (ADVIL) 200 MG tablet Take 200 mg by mouth 2 (two) times daily as  needed for headache or moderate pain.    Yes [provider]  ketoconazole (NIZORAL) 2 % cream APPLY AS NEEDED TO RASH Patient taking differently: Apply 1 application topically daily as needed for irritation.  01/10/20  Yes [name], [name], MD  levothyroxine (SYNTHROID) 25 MCG tablet TAKE 1 TABLET BY MOUTH ONCE DAILY BEFORE BREAKFAST Patient taking differently: Take 25 mcg by mouth daily before breakfast.  01/10/20  Yes [name], [name], MD  lisinopril-hydrochlorothiazide (ZESTORETIC) 20-25 MG tablet Take 1 tablet by mouth daily. 11/23/19  Yes [name], [name], MD  Multiple Vitamin (MULTIVITAMIN WITH MINERALS) TABS tablet Take 1 tablet by mouth daily with breakfast.    Yes [provider]  testosterone enanthate (DELATESTRYL) 200 MG/ML injection INJECT 1 ML INTRAMUSCULARLY  EVERY TWO WEEKS Patient taking differently: Inject 200 mg into the muscle every 14 (fourteen) days.  07/22/19  Yes [name], [name], MD  traMADol (ULTRAM) 50 MG tablet TAKE 1 TABLET BY MOUTH EVERY 6 HOURS AS NEEDED Patient taking differently: Take 50 mg by mouth every 6 (six) hours as needed for moderate pain.  09/01/19  Yes Wyatt Portela, MD    traZODone (DESYREL) 100 MG tablet TAKE 1 TABLET BY MOUTH AT BEDTIME Patient taking differently: Take 100 mg by mouth at bedtime.  12/19/19  Yes [name], [name], MD  triamcinolone cream (KENALOG) 0.1 % APPLY TO AFFECTED AREA 2 TIMES DAILY AS NEEDED Patient taking differently: Apply 1 application topically daily as needed (for rash).  01/10/20  Yes [name], [name], MD  BD DISP NEEDLES 18G X 1-1/2" MISC USE TO INJECT TESTOSTERONE INTO THE MUSCLE AS DIRECTED EVERY 14 DAYS 10/25/19   [name], [name], MD  guaiFENesin-dextromethorphan (ROBITUSSIN DM) 100-10 MG/5ML syrup Take 10 mLs by mouth every 4 (four) hours as needed for cough. Patient not taking: Reported on 01/13/2020 10/20/19   Rai, Vernelle Emerald, MD  SAFETY-LOK 3CC SYR [phone]" 22G X 1-1/2" 3 ML MISC USE TO INJECT 1 ML OF TESTOSTERONE EVERY 14 DAYS 10/25/19   [name], [name], MD    Allergies    Penicillins  Review of Systems   Review of Systems  All other systems reviewed and are negative.   Physical Exam Updated Vital Signs BP (!) 149/60    Pulse 78    Temp 97.6 F (36.4 C) (Oral)    Resp 13    Ht 6' (1.829 m)    Wt 81.6 kg    SpO2 98%    BMI 24.41 kg/m   Physical Exam Vitals and nursing note reviewed.  Constitutional:      General: He is not in acute distress.    Appearance: He is well-developed. He is not ill-appearing, toxic-appearing or diaphoretic.  HENT:     Head: Normocephalic and atraumatic.     Right Ear: External ear normal.     Left Ear: External ear normal.  Eyes:     Conjunctiva/sclera: Conjunctivae normal.     Pupils: Pupils are equal, round, and reactive to light.  Neck:     Trachea: Phonation normal.  Cardiovascular:     Rate and Rhythm: Normal rate and regular rhythm.     Heart sounds: Normal heart sounds.  Pulmonary:     Effort: Pulmonary effort is normal. No respiratory distress.     Breath sounds: No stridor. Rhonchi present.  Musculoskeletal:     Cervical back: Normal range of motion and neck  supple.  Skin:    General: Skin is warm and  dry.     Coloration: Skin is not jaundiced or pale.  Neurological:     Mental Status: He is alert and oriented to person, place, and time.     Motor: No abnormal muscle tone.  Psychiatric:        Mood and Affect: Mood normal.        Behavior: Behavior normal.     ED Results / Procedures / Treatments   Labs (all labs ordered are listed, but only abnormal results are displayed) Labs Reviewed  COMPREHENSIVE METABOLIC PANEL - Abnormal; Notable for the following components:      Result Value   Sodium 134 (*)    Potassium 7.2 (*)    CO2 18 (*)    BUN 43 (*)    Creatinine, Ser 1.52 (*)    Total Protein 8.4 (*)    GFR calc non Af Amer 47 (*)    GFR calc Af Amer 55 (*)    All other components within normal limits  CBC - Abnormal; Notable for the following components:   WBC 11.6 (*)    Hemoglobin 12.6 (*)    All other components within normal limits  URINALYSIS, ROUTINE W REFLEX MICROSCOPIC - Abnormal; Notable for the following components:   APPearance HAZY (*)    Hgb urine dipstick SMALL (*)    Nitrite POSITIVE (*)    Leukocytes,Ua LARGE (*)    WBC, UA >50 (*)    All other components within normal limits  SARS CORONAVIRUS 2 BY RT PCR (HOSPITAL ORDER, [place] [facility])  URINE CULTURE  NA AND K (SODIUM & POTASSIUM), RAND UR    EKG EKG Interpretation  Date/Time:  Friday January 13 2020 18:11:35 EDT Ventricular Rate:  73 PR Interval:    QRS Duration: 128 QT Interval:  409 QTC Calculation: 451 R Axis:   119 Text Interpretation: Sinus rhythm RBBB and LPFB since last tracing no significant change Confirmed by Daleen Bo [phone]) on 01/13/2020 8:28:28 PM   Radiology No results found.  Procedures .Critical Care Performed by: Daleen Bo, MD Authorized by: Daleen Bo, MD   Critical care provider statement:    Critical care time (minutes):  35   Critical care start time:  01/13/2020 5:45 PM   Critical  care end time:  01/13/2020 9:44 PM   Critical care time was exclusive of:  Separately billable procedures and treating other patients   Critical care was necessary to treat or prevent imminent or life-threatening deterioration of the following conditions:  Metabolic crisis   Critical care was time spent personally by me on the following activities:  Blood draw for specimens, development of treatment plan with patient or surrogate, discussions with consultants, evaluation of patient's response to treatment, examination of patient, obtaining history from patient or surrogate, ordering and performing treatments and interventions, ordering and review of laboratory studies, pulse oximetry, re-evaluation of patient's condition, review of old charts and ordering and review of radiographic studies   (including critical care time)  Medications Ordered in ED Medications  0.9 %  sodium chloride infusion ( Intravenous New Bag/Given 01/13/20 2052)  albuterol (PROVENTIL,VENTOLIN) solution continuous neb (10 mg/hr Nebulization New Bag/Given 01/13/20 2100)  sodium chloride flush (NS) 0.9 % injection 3 mL (3 mLs Intravenous Given 01/13/20 1842)  sodium chloride 0.9 % bolus 500 mL (0 mLs Intravenous Stopped 01/13/20 1928)  calcium gluconate 1 g/ 50 mL sodium chloride IVPB (1,000 mg Intravenous New Bag/Given 01/13/20 2054)  insulin aspart (novoLOG) injection  10 Units (10 Units Intravenous Given 01/13/20 2056)    And  dextrose 50 % solution 50 mL (50 mLs Intravenous Given 01/13/20 2053)    ED Course  I have reviewed the triage vital signs and the nursing notes.  Pertinent labs & imaging results that were available during my care of the patient were reviewed by me and considered in my medical decision making (see chart for details).  Clinical Course as of Jan 13 2143  Fri Jan 13, 2020  2025 Normal except white count high, hemoglobin low  CBC(!) [EW]  2026 Normal except sodium low, potassium high, CO2 low, BUN high,  creatinine high, total protein high, GFR low  Comprehensive metabolic panel(!!) [EW]  7169 Normal except presence of hemoglobin, nitrite, leukocyte, white cells.  Will order urine culture since patient is acutely ill.  Urinalysis, Routine w reflex microscopic(!) [EW]  2027 Multiple treatments ordered for hyperkalemia   [EW]  2142 AST: 16 [EW]    Clinical Course User Index [EW] Daleen Bo, MD   MDM Rules/Calculators/A&P                       Patient Vitals for the past 24 hrs:  BP Temp Temp src Pulse Resp SpO2 Height Weight  01/13/20 2130 (!) 149/60 -- -- 78 13 98 % -- --  01/13/20 2111 (!) 145/64 -- -- 73 15 98 % -- --  01/13/20 2100 (!) 145/64 -- -- 73 15 99 % -- --  01/13/20 2030 (!) 152/69 -- -- 69 15 100 % -- --  01/13/20 2026 137/72 -- -- 72 14 98 % -- --  01/13/20 2000 137/72 -- -- 68 14 98 % -- --  01/13/20 1943 (!) 156/75 -- -- 70 14 100 % -- --  01/13/20 1649 -- -- -- -- -- -- 6' (1.829 m) 81.6 kg  01/13/20 1635 (!) 153/85 97.6 F (36.4 C) Oral 71 16 100 % -- --    9:40 PM Reevaluation with update and discussion. After initial assessment and treatment, an updated evaluation reveals he is comfortable receiving treatments for hyperkalemia. Findings discussed and questions answered. Daleen Bo   Medical Decision Making:  This patient is presenting for evaluation of hyperkalemia, which does require a range of treatment options, and is a complaint that involves a high risk of morbidity and mortality. The differential diagnoses include laboratory error, acute renal failure, drug reaction. I decided to review old records, and in summary patient being followed for routine care, surveillance of metastatic kidney cancer found to be hyperkalemic today.  I obtain additional historical information from his wife at the bedside.  Clinical Laboratory Tests Ordered, included CBC, Metabolic panel and Urinalysis. Review indicates abnormal urinalysis, chronic catheterizations.  Elevated potassium, low sodium, CO2 low, BUN high, creatinine high, total protein high, GFR low. Mild white count elevation.   Cardiac Monitor Tracing which shows normal sinus rhythm without arrhythmias    Critical Interventions-clinical evaluation, laboratory testing, cardiac monitoring, EKG surveillance, observation, medication administration reassessment  After These Interventions, the Patient was reevaluated and was found hypokalemic likely from volume depletion, or combined effect of lisinopril and hydrochlorothiazide. He requires hospitalization. No acute EKG abnormalities. No cardiac instability.  CRITICAL CARE-yes Performed by: Daleen Bo  Nursing Notes Reviewed/ Care Coordinated Applicable Imaging Reviewed Interpretation of Laboratory Data incorporated into ED treatment  9:45 PM-Consult complete with hospitalist. Patient case explained and discussed.  He agrees to admit patient for further evaluation and treatment. Call ended  at 9:55 PM  Plan: Admit    Final Clinical Impression(s) / ED Diagnoses Final diagnoses:  Hyperkalemia  [facility]acute kidney [facility]) [facility])    Rx / [place] Orders ED Discharge Orders    None       Daleen Bo, MD 01/13/20 2155

## 2020-01-13 NOTE — Telephone Encounter (Signed)
TC to Pt spoke with Pt's wife informed her Per Dr. Alen Blew after his scan he should go right to [facility] ER to be evaluated. Informed Pt's wife his potassium is 6.9 which is dangerously high. Pt' s wife verbalized understanding. Stated she will make sure he goes to ER.

## 2020-01-13 NOTE — Telephone Encounter (Signed)
CRITICAL VALUE STICKER  CRITICAL VALUE: potassium 6.9  RECEIVER (on-site recipient of call): Lenox Ponds LPN  DATE & TIME NOTIFIED: 01/13/20 1425  MESSENGER (representative from lab):   MD NOTIFIED: Dr. Alen Blew  TIME OF NOTIFICATION:1445  RESPONSE: Pt directed to go to ER after scan

## 2020-01-13 NOTE — ED Triage Notes (Signed)
Patient was notified by his oncologist that his K+- 6.9. Patient was instructed to come to the ED.

## 2020-01-14 DIAGNOSIS — R7303 Prediabetes: Secondary | ICD-10-CM | POA: Diagnosis present

## 2020-01-14 DIAGNOSIS — N179 Acute kidney failure, unspecified: Secondary | ICD-10-CM | POA: Diagnosis present

## 2020-01-14 DIAGNOSIS — Z880 Allergy status to penicillin: Secondary | ICD-10-CM | POA: Diagnosis not present

## 2020-01-14 DIAGNOSIS — E872 Acidosis: Secondary | ICD-10-CM | POA: Diagnosis present

## 2020-01-14 DIAGNOSIS — Z811 Family history of alcohol abuse and dependence: Secondary | ICD-10-CM | POA: Diagnosis not present

## 2020-01-14 DIAGNOSIS — I10 Essential (primary) hypertension: Secondary | ICD-10-CM | POA: Diagnosis not present

## 2020-01-14 DIAGNOSIS — I878 Other specified disorders of veins: Secondary | ICD-10-CM | POA: Diagnosis present

## 2020-01-14 DIAGNOSIS — Z8719 Personal history of other diseases of the digestive system: Secondary | ICD-10-CM | POA: Diagnosis not present

## 2020-01-14 DIAGNOSIS — Z87891 Personal history of nicotine dependence: Secondary | ICD-10-CM | POA: Diagnosis not present

## 2020-01-14 DIAGNOSIS — Z20822 Contact with and (suspected) exposure to covid-19: Secondary | ICD-10-CM | POA: Diagnosis present

## 2020-01-14 DIAGNOSIS — E039 Hypothyroidism, unspecified: Secondary | ICD-10-CM | POA: Diagnosis present

## 2020-01-14 DIAGNOSIS — I129 Hypertensive chronic kidney disease with stage 1 through stage 4 chronic kidney disease, or unspecified chronic kidney disease: Secondary | ICD-10-CM | POA: Diagnosis present

## 2020-01-14 DIAGNOSIS — I452 Bifascicular block: Secondary | ICD-10-CM | POA: Diagnosis present

## 2020-01-14 DIAGNOSIS — D352 Benign neoplasm of pituitary gland: Secondary | ICD-10-CM | POA: Diagnosis present

## 2020-01-14 DIAGNOSIS — N390 Urinary tract infection, site not specified: Secondary | ICD-10-CM | POA: Diagnosis present

## 2020-01-14 DIAGNOSIS — Z85528 Personal history of other malignant neoplasm of kidney: Secondary | ICD-10-CM | POA: Diagnosis not present

## 2020-01-14 DIAGNOSIS — D631 Anemia in chronic kidney disease: Secondary | ICD-10-CM | POA: Diagnosis present

## 2020-01-14 DIAGNOSIS — Z905 Acquired absence of kidney: Secondary | ICD-10-CM | POA: Diagnosis not present

## 2020-01-14 DIAGNOSIS — Z9049 Acquired absence of other specified parts of digestive tract: Secondary | ICD-10-CM | POA: Diagnosis not present

## 2020-01-14 DIAGNOSIS — G8250 Quadriplegia, unspecified: Secondary | ICD-10-CM | POA: Diagnosis present

## 2020-01-14 DIAGNOSIS — C649 Malignant neoplasm of unspecified kidney, except renal pelvis: Secondary | ICD-10-CM

## 2020-01-14 DIAGNOSIS — N1831 Chronic kidney disease, stage 3a: Secondary | ICD-10-CM | POA: Diagnosis present

## 2020-01-14 DIAGNOSIS — Z888 Allergy status to other drugs, medicaments and biological substances status: Secondary | ICD-10-CM | POA: Diagnosis not present

## 2020-01-14 DIAGNOSIS — I69365 Other paralytic syndrome following cerebral infarction, bilateral: Secondary | ICD-10-CM | POA: Diagnosis not present

## 2020-01-14 DIAGNOSIS — Z808 Family history of malignant neoplasm of other organs or systems: Secondary | ICD-10-CM | POA: Diagnosis not present

## 2020-01-14 DIAGNOSIS — E875 Hyperkalemia: Secondary | ICD-10-CM | POA: Diagnosis present

## 2020-01-14 LAB — BASIC METABOLIC PANEL
Anion gap: 9 (ref 5–15)
Anion gap: 7 (ref 5–15)
Anion gap: 8 (ref 5–15)
Anion gap: 8 (ref 5–15)
Anion gap: 7 (ref 5–15)
BUN: 39 mg/dL — ABNORMAL HIGH (ref 8–23)
BUN: 39 mg/dL — ABNORMAL HIGH (ref 8–23)
BUN: 36 mg/dL — ABNORMAL HIGH (ref 8–23)
BUN: 36 mg/dL — ABNORMAL HIGH (ref 8–23)
BUN: 34 mg/dL — ABNORMAL HIGH (ref 8–23)
CO2: 13 mmol/L — ABNORMAL LOW (ref 22–32)
CO2: 17 mmol/L — ABNORMAL LOW (ref 22–32)
CO2: 17 mmol/L — ABNORMAL LOW (ref 22–32)
CO2: 16 mmol/L — ABNORMAL LOW (ref 22–32)
CO2: 18 mmol/L — ABNORMAL LOW (ref 22–32)
Calcium: 8.4 mg/dL — ABNORMAL LOW (ref 8.9–10.3)
Calcium: 8.4 mg/dL — ABNORMAL LOW (ref 8.9–10.3)
Calcium: 8.5 mg/dL — ABNORMAL LOW (ref 8.9–10.3)
Calcium: 8.4 mg/dL — ABNORMAL LOW (ref 8.9–10.3)
Calcium: 8.5 mg/dL — ABNORMAL LOW (ref 8.9–10.3)
Chloride: 110 mmol/L (ref 98–111)
Chloride: 110 mmol/L (ref 98–111)
Chloride: 111 mmol/L (ref 98–111)
Chloride: 113 mmol/L — ABNORMAL HIGH (ref 98–111)
Chloride: 116 mmol/L — ABNORMAL HIGH (ref 98–111)
Creatinine, Ser: 1.46 mg/dL — ABNORMAL HIGH (ref 0.61–1.24)
Creatinine, Ser: 1.4 mg/dL — ABNORMAL HIGH (ref 0.61–1.24)
Creatinine, Ser: 1.44 mg/dL — ABNORMAL HIGH (ref 0.61–1.24)
Creatinine, Ser: 1.48 mg/dL — ABNORMAL HIGH (ref 0.61–1.24)
Creatinine, Ser: 1.56 mg/dL — ABNORMAL HIGH (ref 0.61–1.24)
GFR calc Af Amer: 58 mL/min — ABNORMAL LOW (ref >60)
GFR calc Af Amer: >60 mL/min (ref >60)
GFR calc Af Amer: 59 mL/min — ABNORMAL LOW (ref >60)
GFR calc Af Amer: 57 mL/min — ABNORMAL LOW (ref >60)
GFR calc Af Amer: 53 mL/min — ABNORMAL LOW (ref >60)
GFR calc non Af Amer: 50 mL/min — ABNORMAL LOW (ref >60)
GFR calc non Af Amer: 52 mL/min — ABNORMAL LOW (ref >60)
GFR calc non Af Amer: 51 mL/min — ABNORMAL LOW (ref >60)
GFR calc non Af Amer: 49 mL/min — ABNORMAL LOW (ref >60)
GFR calc non Af Amer: 46 mL/min — ABNORMAL LOW (ref >60)
Glucose, Bld: 91 mg/dL (ref 70–99)
Glucose, Bld: 203 mg/dL — ABNORMAL HIGH (ref 70–99)
Glucose, Bld: 95 mg/dL (ref 70–99)
Glucose, Bld: 118 mg/dL — ABNORMAL HIGH (ref 70–99)
Glucose, Bld: 100 mg/dL — ABNORMAL HIGH (ref 70–99)
Potassium: 6.5 mmol/L (ref 3.5–5.1)
Potassium: 6.1 mmol/L — ABNORMAL HIGH (ref 3.5–5.1)
Potassium: 6 mmol/L — ABNORMAL HIGH (ref 3.5–5.1)
Potassium: 5.7 mmol/L — ABNORMAL HIGH (ref 3.5–5.1)
Potassium: 5.4 mmol/L — ABNORMAL HIGH (ref 3.5–5.1)
Sodium: 132 mmol/L — ABNORMAL LOW (ref 135–145)
Sodium: 134 mmol/L — ABNORMAL LOW (ref 135–145)
Sodium: 136 mmol/L (ref 135–145)
Sodium: 137 mmol/L (ref 135–145)
Sodium: 141 mmol/L (ref 135–145)

## 2020-01-14 LAB — CBC
HCT: 36.2 % — ABNORMAL LOW (ref 39.0–52.0)
Hemoglobin: 10.7 g/dL — ABNORMAL LOW (ref 13.0–17.0)
MCH: 28.9 pg (ref 26.0–34.0)
MCHC: 29.6 g/dL — ABNORMAL LOW (ref 30.0–36.0)
MCV: 97.8 fL (ref 80.0–100.0)
Platelets: 185 10*3/uL (ref 150–400)
RBC: 3.7 MIL/uL — ABNORMAL LOW (ref 4.22–5.81)
RDW: 15.5 % (ref 11.5–15.5)
WBC: 10.3 10*3/uL (ref 4.0–10.5)
nRBC: 0 % (ref 0.0–0.2)

## 2020-01-14 MED ORDER — CALCIUM GLUCONATE-NACL 1-0.675 GM/50ML-% IV SOLN
1.0000 g | Freq: Once | INTRAVENOUS | Status: AC
  Administered 2020-01-14: 1000 mg via INTRAVENOUS
  Filled 2020-01-14: qty 50

## 2020-01-14 MED ORDER — DEXTROSE 50 % IV SOLN
1.0000 | Freq: Once | INTRAVENOUS | Status: AC
  Administered 2020-01-14: 50 mL via INTRAVENOUS
  Filled 2020-01-14: qty 50

## 2020-01-14 MED ORDER — SODIUM POLYSTYRENE SULFONATE 15 GM/60ML PO SUSP
30.0000 g | Freq: Once | ORAL | Status: AC
  Administered 2020-01-14: 30 g via ORAL
  Filled 2020-01-14: qty 120

## 2020-01-14 MED ORDER — SODIUM BICARBONATE 8.4 % IV SOLN
50.0000 meq | Freq: Once | INTRAVENOUS | Status: AC
  Administered 2020-01-14: 50 meq via INTRAVENOUS
  Filled 2020-01-14: qty 50

## 2020-01-14 MED ORDER — INSULIN ASPART 100 UNIT/ML IV SOLN
10.0000 [IU] | Freq: Once | INTRAVENOUS | Status: AC
  Administered 2020-01-14: 10 [IU] via INTRAVENOUS
  Filled 2020-01-14: qty 0.1

## 2020-01-14 MED ORDER — SODIUM CHLORIDE 0.9 % IV SOLN
1.0000 g | Freq: Every day | INTRAVENOUS | Status: DC
  Administered 2020-01-14 – 2020-01-15 (×2): 1 g via INTRAVENOUS
  Filled 2020-01-14: qty 10
  Filled 2020-01-14: qty 1

## 2020-01-14 MED ORDER — ALBUTEROL SULFATE (2.5 MG/3ML) 0.083% IN NEBU
2.5000 mg | INHALATION_SOLUTION | Freq: Once | RESPIRATORY_TRACT | Status: AC
  Administered 2020-01-14: 2.5 mg via RESPIRATORY_TRACT
  Filled 2020-01-14: qty 3

## 2020-01-14 MED ORDER — SODIUM ZIRCONIUM CYCLOSILICATE 10 G PO PACK
10.0000 g | PACK | Freq: Every day | ORAL | Status: DC
  Administered 2020-01-14 – 2020-01-15 (×2): 10 g via ORAL
  Filled 2020-01-14 (×2): qty 1

## 2020-01-14 NOTE — Progress Notes (Signed)
Patient ID: Austin [name], male   DOB: 1955/12/15, 64 y.o.   MRN: [id number]  PROGRESS NOTE    Austin [name]  JJH:[id number] DOB: 1955/10/06 DOA: 01/13/2020 PCP: [name], MD   Brief Narrative:  64 y.o. male with history of metastatic renal cell carcinoma, unspecified CVA with quadriplegia, history of colostomy bag placement for the surgery for diverticulitis, history of pituitary adenoma, anemia, hypertension had a routine follow-up lab work to be done on 01/13/2020 which had shown hyperkalemia and he was instructed to come to the ED.   Patient is on lisinopril at home.  In the ED, patient had potassium of 7.2, creatinine of 1.5 which was increased from 1.3 2 months ago.  EKG showed LBBB with peaked T waves.  Covid testing was negative.  UA had shown pyuria.  He was started on antibiotics and was given calcium gluconate, D50, insulin and Lokelma for hyperkalemia.  Assessment & Plan:   Severe hyperkalemia Acute kidney injury on chronic kidney disease stage IIIa -Probably in the setting of diuretic use with lisinopril/hydrochlorothiazide. -Lisinopril will be discontinued.  Lisinopril/ARB will be listed as allergy/intolerant for future references. -Presented with creatinine of 1.5 and potassium of 7.2.  Received IV calcium gluconate, D50, insulin and Lokelma on presentation. -Potassium 6.5 this morning.  Will repeat calcium gluconate, D50, insulin and add albuterol nebulizer 1 dose along with 1 amp of sodium bicarb IV 50 mEq.  Repeat potassium afterwards.  Will check potassium again in the afternoon. -Spoke with Dr. Schertz/nephrology on the phone on 01/14/2020 who recommended a dose of Kayexalate and repeat dose of Kayexalate later this afternoon if potassium is still elevated.  He also recommended to continue Lokelma 10 g daily till potassium is less than 5.  He also recommends to continue IV fluids.  Continue normal saline at 125 cc an hour.  Patient is making urine output. -Avoid NSAIDs  or other nephrotoxic medications  Urinalysis with pyuria -Currently on Rocephin.  Patient's wife and patient is concerned that he has a history of C. difficile in the past.  Patient is afebrile and they are okay for Rocephin to be discontinued.  Patient usually undergoes straight in and out catheterization 4 times a day at home.  Hypertension -Monitor blood pressure.  Continue amlodipine.  Lisinopril/hydrochlorothiazide held  History of unspecified stroke with quadriplegia -Fall precautions  History of metastatic renal cell cancer with history of left nephrectomy -Patient follow-up with Dr. Alen Blew.  Patient had a repeat CT of the abdomen and pelvis on 01/13/2020 as per Dr. Alen Blew which can be followed up as an outpatient with him.  Hypothyroidism -Continue Synthroid  History of pediatric adenoma -Outpatient follow-up  Anemia of chronic disease -From chronic kidney disease.  Hemoglobin stable.  Monitor   DVT prophylaxis:  Code Status:   Family Communication:  Disposition Plan: Status is: Observation.  He still has persistently elevated potassium requiring intravenous treatment for the same and also IV fluids.  He will require at least 1 more night of hospitalization or even more.  We will switch him to inpatient.  The patient will require care spanning > 2 midnights and should be moved to inpatient because: Persistent severe electrolyte disturbances  Dispo: The patient is from: Home              Anticipated d/c is to: Home              Anticipated d/c date is: 1 day  Patient currently is not medically stable to d/c.   Consultants: Spoke to nephrology/Dr. Jonnie Finner on phone on 01/14/2020  Procedures: None  Antimicrobials: Rocephin from 01/13/2020 onwards   Subjective: Patient seen and examined at bedside.  He is a poor historian but is awake.  Has chronic speech difficulties.  Wife is present at bedside.  No overnight fever, vomiting or shortness of breath  reported.  Objective: Vitals:   01/14/20 0915 01/14/20 0930 01/14/20 1000 01/14/20 1030  BP:  (!) 144/64 (!) 142/67 (!) 145/64  Pulse: 87 88 87 94  Resp: 16 18 16 15   Temp:      TempSrc:      SpO2: 99% 100% 99% 99%  Weight:      Height:        Intake/Output Summary (Last 24 hours) at 01/14/2020 1055 Last data filed at 01/14/2020 0109 Gross per 24 hour  Intake 1150 ml  Output --  Net 1150 ml   Filed Weights   01/13/20 1649  Weight: 81.6 kg    Examination:  General exam: Appears calm and comfortable.  Looks chronically ill.  Poor historian. Respiratory system: Bilateral decreased breath sounds at bases Cardiovascular system: S1 & S2 heard, Rate controlled Gastrointestinal system: Abdomen is nondistended, soft and nontender, colostomy bag in place. Normal bowel sounds heard. Extremities: No cyanosis, clubbing, edema  Central nervous system: Awake, has chronic speech difficulties, poor historian.  Patient is quadriplegic Skin: No rashes, lesions or ulcers Psychiatry: Couldn't be determined because of patient being a poor historian.    Data Reviewed: I have personally reviewed following labs and imaging studies  CBC: Recent Labs  Lab 01/13/20 1329 01/13/20 1654 01/14/20 0450  WBC 10.6* 11.6* 10.3  NEUTROABS 7.8*  --   --   HGB 11.5* 12.6* 10.7*  HCT 35.9* 41.0 36.2*  MCV 91.1 94.0 97.8  PLT 258 265 465   Basic Metabolic Panel: Recent Labs  Lab 01/13/20 1329 01/13/20 1654 01/14/20 0450 01/14/20 0900  NA 133* 134* 132* 134*  K 6.9* 7.2* 6.5* 6.1*  CL 109 106 110 110  CO2 16* 18* 13* 17*  GLUCOSE 88 87 91 203*  BUN 41* 43* 39* 39*  CREATININE 1.60* 1.52* 1.46* 1.40*  CALCIUM 9.5 9.3 8.4* 8.4*   GFR: Estimated Creatinine Clearance: 57.7 mL/min (A) (by C-G formula based on SCr of 1.4 mg/dL (H)). Liver Function Tests: Recent Labs  Lab 01/13/20 1329 01/13/20 1654  AST 12* 16  ALT 16 18  ALKPHOS 69 67  BILITOT 0.3 0.6  PROT 7.9 8.4*  ALBUMIN 3.3* 3.9    No results for input(s): LIPASE, AMYLASE in the last 168 hours. No results for input(s): AMMONIA in the last 168 hours. Coagulation Profile: No results for input(s): INR, PROTIME in the last 168 hours. Cardiac Enzymes: No results for input(s): CKTOTAL, CKMB, CKMBINDEX, TROPONINI in the last 168 hours. BNP (last 3 results) No results for input(s): PROBNP in the last 8760 hours. HbA1C: No results for input(s): HGBA1C in the last 72 hours. CBG: No results for input(s): GLUCAP in the last 168 hours. Lipid Profile: No results for input(s): CHOL, HDL, LDLCALC, TRIG, CHOLHDL, LDLDIRECT in the last 72 hours. Thyroid Function Tests: No results for input(s): TSH, T4TOTAL, FREET4, T3FREE, THYROIDAB in the last 72 hours. Anemia Panel: No results for input(s): VITAMINB12, FOLATE, FERRITIN, TIBC, IRON, RETICCTPCT in the last 72 hours. Sepsis Labs: No results for input(s): PROCALCITON, LATICACIDVEN in the last 168 hours.  Recent Results (from the past  240 hour(s))  SARS Coronavirus 2 by RT PCR (hospital order, performed in [facility] hospital lab) Nasopharyngeal Nasopharyngeal Swab     Status: None   Collection Time: 01/13/20  9:37 PM   Specimen: Nasopharyngeal Swab  Result Value Ref Range Status   SARS Coronavirus 2 NEGATIVE NEGATIVE Final    Comment: (NOTE) SARS-CoV-2 target nucleic acids are NOT DETECTED. The SARS-CoV-2 RNA is generally detectable in upper and lower respiratory specimens during the acute phase of infection. The lowest concentration of SARS-CoV-2 viral copies this assay can detect is 250 copies / mL. A negative result does not preclude SARS-CoV-2 infection and should not be used as the sole basis for treatment or other patient management decisions.  A negative result may occur with improper specimen collection / handling, submission of specimen other than nasopharyngeal swab, presence of viral mutation(s) within the areas targeted by this assay, and inadequate number of  viral copies (<250 copies / mL). A negative result must be combined with clinical observations, patient history, and epidemiological information. Fact Sheet for Patients:   StrictlyIdeas.no Fact Sheet for Healthcare Providers: BankingDealers.co.za This test is not yet approved or cleared  by the [place] FDA and has been authorized for detection and/or diagnosis of SARS-CoV-2 by FDA under an Emergency Use Authorization (EUA).  This EUA will remain in effect (meaning this test can be used) for the duration of the COVID-19 declaration under Section 564(b)(1) of the Act, 21 U.S.C. section 360bbb-3(b)(1), unless the authorization is terminated or revoked sooner. Performed at [facility], [place] [address] [place], [place] [address]          Radiology Studies: No results found.      Scheduled Meds: . amLODipine  10 mg Oral Daily  . enoxaparin (LOVENOX) injection  40 mg Subcutaneous QHS  . escitalopram  10 mg Oral Daily  . levothyroxine  25 mcg Oral Q0600  . sodium zirconium cyclosilicate  10 g Oral Once  . traZODone  100 mg Oral QHS   Continuous Infusions: . sodium chloride    . cefTRIAXone (ROCEPHIN)  IV 1 g (01/14/20 0440)          Aline August, MD [facility] 01/14/2020, 10:55 AM

## 2020-01-14 NOTE — ED Notes (Signed)
Right upper arm IV appears swollen and bruised. Pt denies pain. Flushes without difficulty but swelling and bruising not noted by RN or wife earlier. Pt's right arm remains contracted at baseline. IV d/c'd and intact. Resited to prevent future issues. Pt repositioned for comfort.

## 2020-01-14 NOTE — Progress Notes (Signed)
Patient visitors will be Peter Congo (wife) and Nira Conn (Daughter)

## 2020-01-14 NOTE — ED Notes (Signed)
Patient provided with lunch tray

## 2020-01-14 NOTE — ED Notes (Signed)
Patient provided with meal tray.

## 2020-01-14 NOTE — ED Notes (Addendum)
Pt resting in personal w/c. Wife at bedside. No complaints at this time. Cont to monitor.

## 2020-01-14 NOTE — Progress Notes (Signed)
Pt refused to be turn to back and left side and requested to be positioned on right side. RN attempted to explain to pt reason for intermittent turning; pt still refuses. Pt is alert and oriented x4.

## 2020-01-14 NOTE — ED Notes (Signed)
Pt placed in hospital bed and repositioned for comfort.

## 2020-01-15 DIAGNOSIS — N390 Urinary tract infection, site not specified: Secondary | ICD-10-CM

## 2020-01-15 DIAGNOSIS — E039 Hypothyroidism, unspecified: Secondary | ICD-10-CM

## 2020-01-15 LAB — COMPREHENSIVE METABOLIC PANEL
ALT: 15 U/L (ref 0–44)
AST: 13 U/L — ABNORMAL LOW (ref 15–41)
Albumin: 3 g/dL — ABNORMAL LOW (ref 3.5–5.0)
Alkaline Phosphatase: 52 U/L (ref 38–126)
Anion gap: 6 (ref 5–15)
BUN: 32 mg/dL — ABNORMAL HIGH (ref 8–23)
CO2: 17 mmol/L — ABNORMAL LOW (ref 22–32)
Calcium: 8.5 mg/dL — ABNORMAL LOW (ref 8.9–10.3)
Chloride: 118 mmol/L — ABNORMAL HIGH (ref 98–111)
Creatinine, Ser: 1.37 mg/dL — ABNORMAL HIGH (ref 0.61–1.24)
GFR calc Af Amer: >60 mL/min (ref >60)
GFR calc non Af Amer: 54 mL/min — ABNORMAL LOW (ref >60)
Glucose, Bld: 89 mg/dL (ref 70–99)
Potassium: 5 mmol/L (ref 3.5–5.1)
Sodium: 141 mmol/L (ref 135–145)
Total Bilirubin: 0.3 mg/dL (ref 0.3–1.2)
Total Protein: 6.5 g/dL (ref 6.5–8.1)

## 2020-01-15 LAB — MAGNESIUM: Magnesium: 2 mg/dL (ref 1.7–2.4)

## 2020-01-15 LAB — BASIC METABOLIC PANEL
Anion gap: 10 (ref 5–15)
BUN: 29 mg/dL — ABNORMAL HIGH (ref 8–23)
CO2: 17 mmol/L — ABNORMAL LOW (ref 22–32)
Calcium: 8.1 mg/dL — ABNORMAL LOW (ref 8.9–10.3)
Chloride: 113 mmol/L — ABNORMAL HIGH (ref 98–111)
Creatinine, Ser: 1.45 mg/dL — ABNORMAL HIGH (ref 0.61–1.24)
GFR calc Af Amer: 58 mL/min — ABNORMAL LOW (ref >60)
GFR calc non Af Amer: 50 mL/min — ABNORMAL LOW (ref >60)
Glucose, Bld: 184 mg/dL — ABNORMAL HIGH (ref 70–99)
Potassium: 4.6 mmol/L (ref 3.5–5.1)
Sodium: 140 mmol/L (ref 135–145)

## 2020-01-15 MED ORDER — SODIUM ZIRCONIUM CYCLOSILICATE 10 G PO PACK
10.0000 g | PACK | Freq: Every day | ORAL | Status: DC
  Filled 2020-01-15: qty 1

## 2020-01-15 MED ORDER — SODIUM ZIRCONIUM CYCLOSILICATE 10 G PO PACK: 10.0000 g | PACK | Freq: Once | ORAL | Status: DC

## 2020-01-15 MED ORDER — OXYCODONE HCL 5 MG PO TABS
5.0000 mg | ORAL_TABLET | Freq: Four times a day (QID) | ORAL | Status: DC | PRN
  Administered 2020-01-15 (×2): 5 mg via ORAL
  Filled 2020-01-15 (×2): qty 1

## 2020-01-15 MED ORDER — SODIUM BICARBONATE 8.4 % IV SOLN
INTRAVENOUS | Status: DC
  Filled 2020-01-15 (×6): qty 100

## 2020-01-15 MED ORDER — SODIUM CHLORIDE 0.9 % IV SOLN
1.0000 g | INTRAVENOUS | Status: DC
  Administered 2020-01-15: 1 g via INTRAVENOUS
  Filled 2020-01-15: qty 10

## 2020-01-15 NOTE — Progress Notes (Signed)
Patient ID: Austin [name], male   DOB: 1956-02-23, 64 y.o.   MRN: [id number]  PROGRESS NOTE    Austin [name]  VVO:[id number] DOB: 1956/06/11 DOA: 01/13/2020 PCP: [name], MD   Brief Narrative:  64 y.o. male with history of metastatic renal cell carcinoma, unspecified CVA with quadriplegia, history of colostomy bag placement for the surgery for diverticulitis, history of pituitary adenoma, anemia, hypertension had a routine follow-up lab work to be done on 01/13/2020 which had shown hyperkalemia and he was instructed to come to the ED.   Patient is on lisinopril at home.  In the ED, patient had potassium of 7.2, creatinine of 1.5 which was increased from 1.3 2 months ago.  EKG showed LBBB with peaked T waves.  Covid testing was negative.  UA had shown pyuria.  He was started on antibiotics and was given calcium gluconate, D50, insulin and Lokelma for hyperkalemia.  Assessment & Plan:   Severe hyperkalemia Acute kidney injury on chronic kidney disease stage IIIa Acute metabolic acidosis -Probably in the setting of diuretic use with lisinopril/hydrochlorothiazide. -Lisinopril will be discontinued.  Lisinopril/ARB will be listed as allergy/intolerant for future references. -Presented with creatinine of 1.5 and potassium of 7.2.  Received IV calcium gluconate, D50, insulin and Lokelma on presentation. -Spoke with Dr. Schertz/nephrology on the phone on 01/14/2020 and patient received 2 doses of Kayexalate on 01/14/2020 and subsequent potassium levels were above 6 and 5.7 respectively.  He also was given Lokelma on 01/14/2020.  Potassium is 5 this morning.  Will continue Lokelma till potassium is less than 5.   -Currently on normal saline at 125 cc an hour.  Bicarb is 17 this morning.  Reviewed the case with Dr. Jonnie Finner again today on 01/15/2020 and he recommended bicarb drip for today.  Will start bicarb drip at 100 cc an hour.  Repeat BMP this evening and again tomorrow morning. -Avoid NSAIDs or  other nephrotoxic medications  Urinalysis with pyuria -Currently on Rocephin.  Patient's wife and patient is concerned that he has a history of C. difficile in the past.  Patient usually undergoes straight in and out catheterization 4 times a day at home. -After discussion with family yesterday, Rocephin was supposed to be discontinued.  Patient had temperature of 100.9 last night.  Will resume Rocephin.  Follow cultures.  Hypertension -Monitor blood pressure.  Continue amlodipine.  Lisinopril/hydrochlorothiazide held  History of unspecified stroke with quadriplegia -Fall precautions  History of metastatic renal cell cancer with history of left nephrectomy -Patient follow-up with Dr. Alen Blew.  Patient had a repeat CT of the abdomen and pelvis on 01/13/2020 as per Dr. Alen Blew which can be followed up as an outpatient with him.  Hypothyroidism -Continue Synthroid  History of pediatric adenoma -Outpatient follow-up  Anemia of chronic disease -From chronic kidney disease.  Hemoglobin stable.  Monitor   DVT prophylaxis: Lovenox Code Status: Full Family Communication: Wife at bedside Disposition Plan: Inpatient.  Still requiring inpatient care  because: Persistent severe electrolyte disturbances.  Still acidotic and potassium is still borderline.  Will need bicarb drip for another 24 hours at least  Dispo: The patient is from: Home              Anticipated d/c is to: Home              Anticipated d/c date is: 1 day              Patient currently is not medically stable to d/c.  Consultants: Spoke to nephrology/Dr. Jonnie Finner on phone on 01/14/2020.  Communicated with Dr. Jonnie Finner again on 01/15/2020.  Procedures: None  Antimicrobials: Rocephin from 01/13/2020 onwards   Subjective: Patient seen and examined at bedside.  He is a poor historian but is awake.  Has chronic speech difficulties.  Denies worsening nausea or vomiting.  Had fever overnight.  Objective: Vitals:   01/14/20 1531  01/14/20 2029 01/15/20 0014 01/15/20 0429  BP: 135/67 129/69 127/69 (!) 144/66  Pulse: 92 95 100 93  Resp: 15 18 18 18   Temp: 99.1 F (37.3 C) (!) 100.9 F (38.3 C) 98.9 F (37.2 C) 98.7 F (37.1 C)  TempSrc: Oral Oral Oral Oral  SpO2: 99% 94% 94% 95%  Weight:      Height:        Intake/Output Summary (Last 24 hours) at 01/15/2020 0927 Last data filed at 01/15/2020 0700 Gross per 24 hour  Intake 1417.7 ml  Output 4525 ml  Net -3107.3 ml   Filed Weights   01/13/20 1649  Weight: 81.6 kg    Examination:  General exam: No acute distress.  Looks chronically ill.  Poor historian. Respiratory system: Bilateral decreased breath sounds at bases with some scattered crackles Cardiovascular system: Rate controlled, S1-S2 heard Gastrointestinal system: Abdomen is nondistended, soft and nontender, colostomy bag in place.  Bowel sounds are heard.   Extremities: Trace lower extremity edema.  No cyanosis Central nervous system: Awake, has chronic speech difficulties, poor historian.  Patient is quadriplegic.  No changes in neurological status Skin: No petechiae or ulcers Psychiatry: Cannot be assessed because patient is a poor historian.    Data Reviewed: I have personally reviewed following labs and imaging studies  CBC: Recent Labs  Lab 01/13/20 1329 01/13/20 1654 01/14/20 0450  WBC 10.6* 11.6* 10.3  NEUTROABS 7.8*  --   --   HGB 11.5* 12.6* 10.7*  HCT 35.9* 41.0 36.2*  MCV 91.1 94.0 97.8  PLT 258 265 027   Basic Metabolic Panel: Recent Labs  Lab 01/14/20 0900 01/14/20 1300 01/14/20 1737 01/14/20 2228 01/15/20 0419  NA 134* 136 137 141 141  K 6.1* 6.0* 5.7* 5.4* 5.0  CL 110 111 113* 116* 118*  CO2 17* 17* 16* 18* 17*  GLUCOSE 203* 95 118* 100* 89  BUN 39* 36* 36* 34* 32*  CREATININE 1.40* 1.44* 1.48* 1.56* 1.37*  CALCIUM 8.4* 8.5* 8.4* 8.5* 8.5*  MG  --   --   --   --  2.0   GFR: Estimated Creatinine Clearance: 59 mL/min (A) (by C-G formula based on SCr of 1.37  mg/dL (H)). Liver Function Tests: Recent Labs  Lab 01/13/20 1329 01/13/20 1654 01/15/20 0419  AST 12* 16 13*  ALT 16 18 15   ALKPHOS 69 67 52  BILITOT 0.3 0.6 0.3  PROT 7.9 8.4* 6.5  ALBUMIN 3.3* 3.9 3.0*   No results for input(s): LIPASE, AMYLASE in the last 168 hours. No results for input(s): AMMONIA in the last 168 hours. Coagulation Profile: No results for input(s): INR, PROTIME in the last 168 hours. Cardiac Enzymes: No results for input(s): CKTOTAL, CKMB, CKMBINDEX, TROPONINI in the last 168 hours. BNP (last 3 results) No results for input(s): PROBNP in the last 8760 hours. HbA1C: No results for input(s): HGBA1C in the last 72 hours. CBG: No results for input(s): GLUCAP in the last 168 hours. Lipid Profile: No results for input(s): CHOL, HDL, LDLCALC, TRIG, CHOLHDL, LDLDIRECT in the last 72 hours. Thyroid Function Tests: No results for  input(s): TSH, T4TOTAL, FREET4, T3FREE, THYROIDAB in the last 72 hours. Anemia Panel: No results for input(s): VITAMINB12, FOLATE, FERRITIN, TIBC, IRON, RETICCTPCT in the last 72 hours. Sepsis Labs: No results for input(s): PROCALCITON, LATICACIDVEN in the last 168 hours.  Recent Results (from the past 240 hour(s))  SARS Coronavirus 2 by RT PCR (hospital order, performed in [facility] hospital lab) Nasopharyngeal Nasopharyngeal Swab     Status: None   Collection Time: 01/13/20  9:37 PM   Specimen: Nasopharyngeal Swab  Result Value Ref Range Status   SARS Coronavirus 2 NEGATIVE NEGATIVE Final    Comment: (NOTE) SARS-CoV-2 target nucleic acids are NOT DETECTED. The SARS-CoV-2 RNA is generally detectable in upper and lower respiratory specimens during the acute phase of infection. The lowest concentration of SARS-CoV-2 viral copies this assay can detect is 250 copies / mL. A negative result does not preclude SARS-CoV-2 infection and should not be used as the sole basis for treatment or other patient management decisions.  A negative  result may occur with improper specimen collection / handling, submission of specimen other than nasopharyngeal swab, presence of viral mutation(s) within the areas targeted by this assay, and inadequate number of viral copies (<250 copies / mL). A negative result must be combined with clinical observations, patient history, and epidemiological information. Fact Sheet for Patients:   StrictlyIdeas.no Fact Sheet for Healthcare Providers: BankingDealers.co.za This test is not yet approved or cleared  by the [place] FDA and has been authorized for detection and/or diagnosis of SARS-CoV-2 by FDA under an Emergency Use Authorization (EUA).  This EUA will remain in effect (meaning this test can be used) for the duration of the COVID-19 declaration under Section 564(b)(1) of the Act, 21 U.S.C. section 360bbb-3(b)(1), unless the authorization is terminated or revoked sooner. Performed at [facility], [place] [address], [place], [place] [address]          Radiology Studies: CT Abdomen Pelvis W Wo Contrast  Result Date: 01/14/2020 CLINICAL DATA:  Patient with history of metastatic renal cell carcinoma. Follow-up exam. EXAM: CT CHEST WITH CONTRAST CT ABDOMEN AND PELVIS WITH AND WITHOUT CONTRAST TECHNIQUE: Multidetector CT imaging of the chest was performed during intravenous contrast administration. Multidetector CT imaging of the abdomen and pelvis was performed following the standard protocol before and during bolus administration of intravenous contrast. CONTRAST:  41mL OMNIPAQUE IOHEXOL 300 MG/ML  SOLN COMPARISON:  CT abdomen pelvis October 17, 2019; CT C AP March 10, 2019 FINDINGS: CT CHEST FINDINGS Cardiovascular: Normal heart size. Trace fluid superior pericardial recess. Thoracic aortic vascular calcifications. Multiple collateral vessels opacified within the right hemithorax. Mediastinum/Nodes: Normal appearance of the  esophagus. No enlarged axillary lymphadenopathy. Slight interval increase in size of left hilar node measuring 1.3 cm (image 57; series 4), previously 1.2 cm. Lungs/Pleura: Mucous demonstrated within the tracheal air column. Interval increase in size of bilateral pulmonary nodules. Reference right middle lobe pulmonary nodule measures 1.9 x 1.5 cm, previously 1.6 x 1.2 cm. Reference right lower lobe pulmonary nodule measures 1.7 cm (image 89; series 9), previously 1.2 cm. Reference left lower lobe nodule measures 1.1 cm (image 94; series 9), previously 0.8 cm. No pleural effusion or pneumothorax. Musculoskeletal: Thoracic spine degenerative changes. No aggressive or acute appearing osseous lesions. CT ABDOMEN AND PELVIS FINDINGS Hepatobiliary: Liver is normal in size and contour. No focal hepatic lesion is identified. Prior cholecystectomy. No intrahepatic or extrahepatic biliary ductal dilatation. Pancreas: Unremarkable Spleen: Interval decrease in size of heterogeneous lesion within  the spleen measuring 2.8 x 2.0 cm (image 40; series 5), previously 2.7 x 3.5 cm. Adrenals/Urinary Tract: Similar-appearing left greater than right adrenal nodularity with the left adrenal gland measuring up to 2.3 cm (image 56; series 5). Postsurgical changes compatible with left nephrectomy. Interval increase in size of nodule within the left nephrectomy bed measuring 2.6 x 1.9 cm (image 68; series 5), previously 1.6 x 1.2 cm. Right kidney enhances appropriately with contrast. Similar-appearing 1.3 cm dense exophytic lesion inferior pole right kidney. Urinary bladder is unremarkable. Stomach/Bowel: Normal morphology of the stomach. No evidence for small bowel obstruction. Left anterior abdominal wall colostomy. No free fluid or free intraperitoneal air. Vascular/Lymphatic: Normal caliber abdominal aorta. Peripheral calcified atherosclerotic plaque. No retroperitoneal lymphadenopathy. Reproductive: Heterogeneous prostate. Other:  Similar to mild interval increase in size of mass within the right hemiabdomen measuring up to 7.7 x 7.1 cm, previously 7.3 x 7.0 cm. Slight interval increase in size of nodule within the anterior right hemiabdomen measuring 3.6 x 2.3 cm (image 90; series 5), previously 2.8 x 2.0 cm. Interval increase in size of nodule within the right hemipelvis measuring 1.8 cm (image 128; series 5), previously 1.4 cm. Increased additional scattered nodules within the pelvis. Increased metastatic nodule within the left upper quadrant measuring 1.3 cm (image 57; series 5), previously 0.9 cm. Musculoskeletal: Lower thoracic and lumbar spine degenerative changes. No aggressive or acute appearing osseous lesions. IMPRESSION: 1. Interval increase in size of bilateral metastatic pulmonary nodules. 2. Similar to mild interval increase in size of mass within the right hemiabdomen. 3. Interval increase in size of nodule within the left nephrectomy bed. 4. Increased metastatic nodularity within the abdomen and pelvis. 5. Aortic atherosclerosis. Electronically Signed   By: Lovey Newcomer M.D.   On: 01/14/2020 12:20   CT Chest W Contrast  Result Date: 01/14/2020 CLINICAL DATA:  Patient with history of metastatic renal cell carcinoma. Follow-up exam. EXAM: CT CHEST WITH CONTRAST CT ABDOMEN AND PELVIS WITH AND WITHOUT CONTRAST TECHNIQUE: Multidetector CT imaging of the chest was performed during intravenous contrast administration. Multidetector CT imaging of the abdomen and pelvis was performed following the standard protocol before and during bolus administration of intravenous contrast. CONTRAST:  88mL OMNIPAQUE IOHEXOL 300 MG/ML  SOLN COMPARISON:  CT abdomen pelvis October 17, 2019; CT C AP March 10, 2019 FINDINGS: CT CHEST FINDINGS Cardiovascular: Normal heart size. Trace fluid superior pericardial recess. Thoracic aortic vascular calcifications. Multiple collateral vessels opacified within the right hemithorax. Mediastinum/Nodes: Normal  appearance of the esophagus. No enlarged axillary lymphadenopathy. Slight interval increase in size of left hilar node measuring 1.3 cm (image 57; series 4), previously 1.2 cm. Lungs/Pleura: Mucous demonstrated within the tracheal air column. Interval increase in size of bilateral pulmonary nodules. Reference right middle lobe pulmonary nodule measures 1.9 x 1.5 cm, previously 1.6 x 1.2 cm. Reference right lower lobe pulmonary nodule measures 1.7 cm (image 89; series 9), previously 1.2 cm. Reference left lower lobe nodule measures 1.1 cm (image 94; series 9), previously 0.8 cm. No pleural effusion or pneumothorax. Musculoskeletal: Thoracic spine degenerative changes. No aggressive or acute appearing osseous lesions. CT ABDOMEN AND PELVIS FINDINGS Hepatobiliary: Liver is normal in size and contour. No focal hepatic lesion is identified. Prior cholecystectomy. No intrahepatic or extrahepatic biliary ductal dilatation. Pancreas: Unremarkable Spleen: Interval decrease in size of heterogeneous lesion within the spleen measuring 2.8 x 2.0 cm (image 40; series 5), previously 2.7 x 3.5 cm. Adrenals/Urinary Tract: Similar-appearing left greater than right adrenal nodularity with the  left adrenal gland measuring up to 2.3 cm (image 56; series 5). Postsurgical changes compatible with left nephrectomy. Interval increase in size of nodule within the left nephrectomy bed measuring 2.6 x 1.9 cm (image 68; series 5), previously 1.6 x 1.2 cm. Right kidney enhances appropriately with contrast. Similar-appearing 1.3 cm dense exophytic lesion inferior pole right kidney. Urinary bladder is unremarkable. Stomach/Bowel: Normal morphology of the stomach. No evidence for small bowel obstruction. Left anterior abdominal wall colostomy. No free fluid or free intraperitoneal air. Vascular/Lymphatic: Normal caliber abdominal aorta. Peripheral calcified atherosclerotic plaque. No retroperitoneal lymphadenopathy. Reproductive: Heterogeneous  prostate. Other: Similar to mild interval increase in size of mass within the right hemiabdomen measuring up to 7.7 x 7.1 cm, previously 7.3 x 7.0 cm. Slight interval increase in size of nodule within the anterior right hemiabdomen measuring 3.6 x 2.3 cm (image 90; series 5), previously 2.8 x 2.0 cm. Interval increase in size of nodule within the right hemipelvis measuring 1.8 cm (image 128; series 5), previously 1.4 cm. Increased additional scattered nodules within the pelvis. Increased metastatic nodule within the left upper quadrant measuring 1.3 cm (image 57; series 5), previously 0.9 cm. Musculoskeletal: Lower thoracic and lumbar spine degenerative changes. No aggressive or acute appearing osseous lesions. IMPRESSION: 1. Interval increase in size of bilateral metastatic pulmonary nodules. 2. Similar to mild interval increase in size of mass within the right hemiabdomen. 3. Interval increase in size of nodule within the left nephrectomy bed. 4. Increased metastatic nodularity within the abdomen and pelvis. 5. Aortic atherosclerosis. Electronically Signed   By: Lovey Newcomer M.D.   On: 01/14/2020 12:20        Scheduled Meds: . amLODipine  10 mg Oral Daily  . enoxaparin (LOVENOX) injection  40 mg Subcutaneous QHS  . escitalopram  10 mg Oral Daily  . levothyroxine  25 mcg Oral Q0600  . sodium zirconium cyclosilicate  10 g Oral Once  . sodium zirconium cyclosilicate  10 g Oral Daily  . traZODone  100 mg Oral QHS   Continuous Infusions: . sodium chloride 125 mL/hr at 01/14/20 1930          Aline August, MD [facility] 01/15/2020, 9:27 AM

## 2020-01-16 LAB — URINE CULTURE: Culture: >=100000 — AB

## 2020-01-16 LAB — BASIC METABOLIC PANEL
Anion gap: 8 (ref 5–15)
BUN: 25 mg/dL — ABNORMAL HIGH (ref 8–23)
CO2: 20 mmol/L — ABNORMAL LOW (ref 22–32)
Calcium: 7.7 mg/dL — ABNORMAL LOW (ref 8.9–10.3)
Chloride: 108 mmol/L (ref 98–111)
Creatinine, Ser: 1.24 mg/dL (ref 0.61–1.24)
GFR calc Af Amer: >60 mL/min (ref >60)
GFR calc non Af Amer: >60 mL/min (ref >60)
Glucose, Bld: 136 mg/dL — ABNORMAL HIGH (ref 70–99)
Potassium: 4 mmol/L (ref 3.5–5.1)
Sodium: 136 mmol/L (ref 135–145)

## 2020-01-16 LAB — CBC
HCT: 30.3 % — ABNORMAL LOW (ref 39.0–52.0)
Hemoglobin: 9.5 g/dL — ABNORMAL LOW (ref 13.0–17.0)
MCH: 28.6 pg (ref 26.0–34.0)
MCHC: 31.4 g/dL (ref 30.0–36.0)
MCV: 91.3 fL (ref 80.0–100.0)
Platelets: 204 10*3/uL (ref 150–400)
RBC: 3.32 MIL/uL — ABNORMAL LOW (ref 4.22–5.81)
RDW: 15.5 % (ref 11.5–15.5)
WBC: 8.6 10*3/uL (ref 4.0–10.5)
nRBC: 0 % (ref 0.0–0.2)

## 2020-01-16 LAB — MAGNESIUM: Magnesium: 1.8 mg/dL (ref 1.7–2.4)

## 2020-01-16 MED ORDER — TRAMADOL HCL 50 MG PO TABS: 50.0000 mg | ORAL_TABLET | Freq: Four times a day (QID) | ORAL | Status: DC | PRN

## 2020-01-16 MED ORDER — CEFPODOXIME PROXETIL 200 MG PO TABS: 200.0000 mg | ORAL_TABLET | Freq: Two times a day (BID) | ORAL | 0 refills | Status: AC

## 2020-01-16 MED ORDER — SODIUM BICARBONATE 650 MG PO TABS
650.0000 mg | ORAL_TABLET | Freq: Two times a day (BID) | ORAL | 0 refills | Status: AC
Start: 2020-01-16 — End: 2020-01-31

## 2020-01-16 MED ORDER — TRIAMCINOLONE ACETONIDE 0.1 % EX CREA: 1.0000 "application " | TOPICAL_CREAM | Freq: Every day | CUTANEOUS | Status: DC | PRN

## 2020-01-16 NOTE — TOC Progression Note (Signed)
Transition of Care [facility]) - Progression Note    Patient Details  Name: Austin [name] MRN: [id number] Date of Birth: 10-11-1955  Transition of Care [facility]) CM/SW Contact  Purcell Mouton, RN Phone Number: 01/16/2020, 12:09 PM  Clinical Narrative:     Pt discharging with no needs.        Expected Discharge Plan and Services           Expected Discharge Date: 01/16/20                                     Social Determinants of Health (SDOH) Interventions    Readmission Risk Interventions No flowsheet data found.

## 2020-01-16 NOTE — Progress Notes (Signed)
Discussed with patient and spouse discharge instructions, they verbalized agreement and understanding.  Patient to leave in private vehicle with all belongings.

## 2020-01-16 NOTE — Discharge Summary (Signed)
Physician Discharge Summary  Austin [name] UUV:[id number] DOB: 10-27-1955 DOA: 01/13/2020  PCP: [name], MD  Admit date: 01/13/2020 Discharge date: 01/16/2020  Admitted From: Home Disposition: Home  Recommendations for Outpatient Follow-up:  1. Follow up with PCP in 1 week with repeat CBC/BMP 2. Outpatient follow-up with oncology 3. Follow up in ED if symptoms worsen or new appear   Home Health: No Equipment/Devices: None  Discharge Condition: Guarded to poor CODE STATUS: Full Diet recommendation: Heart healthy  Brief/Interim Summary: 64 y.o.malewithhistory of metastatic renal cell carcinoma, unspecified CVA with quadriplegia, history of colostomy bag placement for the surgery for diverticulitis, history of pituitary adenoma, anemia, hypertension had a routine follow-up lab work to be done on 01/13/2020 which had shown hyperkalemia and he was instructed to come to the ED.  Patient is on lisinopril at home.  In the ED, patient had potassium of 7.2, creatinine of 1.5 which was increased from 1.3 2 months ago.  EKG showed LBBB with peaked T waves.  Covid testing was negative.  UA had shown pyuria.  He was started on antibiotics and was given calcium gluconate, D50, insulin and Lokelma for hyperkalemia. During the hospitalization, his condition gradually improved. He was subsequently switched to bicarb drip. Rocephin was continued. Urine culture is growing Serratia. His potassium is normal now and bicarb level is improving. He will be discharged home on oral Vantin for 5 more days and sodium bicarbonate tablets twice a day for 2 weeks. Outpatient follow-up with PCP and oncology.  Discharge Diagnoses:  Severe hyperkalemia Acute kidney injury on chronic kidney disease stage IIIa Acute metabolic acidosis -Probably in the setting of diuretic use with lisinopril/hydrochlorothiazide. -Lisinopril will be discontinued.  Lisinopril/ARB will be listed as allergy/intolerant for future  references. -Presented with creatinine of 1.5 and potassium of 7.2.  Received IV calcium gluconate, D50, insulin and Lokelma on presentation. -Spoke with Dr. Schertz/nephrology on the phone on 01/14/2020 and patient received 2 doses of Kayexalate on 01/14/2020 and subsequent potassium levels were above 6 and 5.7 respectively.  He also was given Lokelma on 01/14/2020. -Potassium was 5 on 01/15/2020 for which she received Lokelma again. Bicarb is 17 on 01/15/2020 and after discussing with nephrology/Dr. Jonnie Finner, he was started on bicarb drip. -Potassium 4 this morning with bicarb of 20. - will discharge home on  oral sodium bicarbonate tablets 650 mg twice a day for 2 weeks. Outpatient follow-up of BMP within a week. Might benefit from nephrology evaluation and follow-up as an outpatient if needed.  -Avoid NSAIDs or other nephrotoxic medications. DC ibuprofen  UTI: Present on admission -Currently on Rocephin.  -Urine culture is growing Serratia resistant to Ancef. Discharge home on Vantin for 5 more days.  Hypertension -Outpatient follow-up. Continue amlodipine.  Lisinopril/hydrochlorothiazide held  History of unspecified stroke with quadriplegia -Outpatient follow-up.  History of metastatic renal cell cancer with history of left nephrectomy -Patient follow-up with Dr. Alen Blew.  Patient had a repeat CT of the abdomen and pelvis on 01/13/2020 as per Dr. Alen Blew which can be followed up as an outpatient with him. -If condition were to worsen, consider hospice/comfort measures  Hypothyroidism -Continue Synthroid  History of pituitary adenoma -Outpatient follow-up  Anemia of chronic disease -From chronic kidney disease.  Hemoglobin stable. Outpatient follow-up  Discharge Instructions  Discharge Instructions    Diet - low sodium heart healthy   Complete by: As directed    Increase activity slowly   Complete by: As directed      Allergies as  of 01/16/2020      Reactions   Ace Inhibitors Other  (See Comments)   Solitary kidney, experienced severe hyperkalemia on lisinopril (despite combined with HCTZ, no additional KCl)   Angiotensin Receptor Blockers Other (See Comments)   See ACEi   Penicillins Rash, Other (See Comments)   Has patient had a PCN reaction causing immediate rash, facial/tongue/throat swelling, SOB or lightheadedness with hypotension: YES Has patient had a PCN reaction causing severe rash involving mucus membranes or skin necrosis: No Has patient had a PCN reaction that required hospitalization No Has patient had a PCN reaction occurring within the last 10 years: Yes  If all of the above answers are "NO", then may proceed with Cephalosporin use.      Medication List    STOP taking these medications   furosemide 20 MG tablet Commonly known as: LASIX   guaiFENesin-dextromethorphan 100-10 MG/5ML syrup Commonly known as: ROBITUSSIN DM   ibuprofen 200 MG tablet Commonly known as: ADVIL   lisinopril-hydrochlorothiazide 20-25 MG tablet Commonly known as: ZESTORETIC     TAKE these medications   acetaminophen 500 MG tablet Commonly known as: TYLENOL Take 1 tablet (500 mg total) by mouth every 6 (six) hours as needed for mild pain, moderate pain, fever or headache.   amLODipine 10 MG tablet Commonly known as: NORVASC Take 1 tablet (10 mg total) by mouth daily.   BD Disp Needles 18G X 1-1/2" Misc Generic drug: NEEDLE (DISP) 18 G USE TO INJECT TESTOSTERONE INTO THE MUSCLE AS DIRECTED EVERY 14 DAYS   cefpodoxime 200 MG tablet Commonly known as: VANTIN Take 1 tablet (200 mg total) by mouth 2 (two) times daily for 5 days. Patient has tolerated Ceftriaxone in the hospital. Ok to give Vantin.   escitalopram 10 MG tablet Commonly known as: LEXAPRO TAKE 1 TABLET BY MOUTH IN THE MORNING What changed: when to take this   ketoconazole 2 % cream Commonly known as: NIZORAL APPLY AS NEEDED TO RASH What changed: See the new instructions.   levothyroxine 25 MCG  tablet Commonly known as: SYNTHROID TAKE 1 TABLET BY MOUTH ONCE DAILY BEFORE BREAKFAST What changed: See the new instructions.   multivitamin with minerals Tabs tablet Take 1 tablet by mouth daily with breakfast.   SAFETY-LOK 3CC SYR 22GX1.5" 22G X 1-1/2" 3 ML Misc Generic drug: SYRINGE-NEEDLE (DISP) 3 ML USE TO INJECT 1 ML OF TESTOSTERONE EVERY 14 DAYS   sodium bicarbonate 650 MG tablet Take 1 tablet (650 mg total) by mouth 2 (two) times daily for 15 days.   testosterone enanthate 200 MG/ML injection Commonly known as: DELATESTRYL INJECT 1 ML INTRAMUSCULARLY  EVERY TWO WEEKS What changed:   how much to take  how to take this  when to take this  additional instructions   traMADol 50 MG tablet Commonly known as: ULTRAM Take 1 tablet (50 mg total) by mouth every 6 (six) hours as needed for moderate pain.   traZODone 100 MG tablet Commonly known as: DESYREL TAKE 1 TABLET BY MOUTH AT BEDTIME   triamcinolone cream 0.1 % Commonly known as: KENALOG Apply 1 application topically daily as needed (for rash). What changed: See the new instructions.      Follow-up Information    [name], [name], MD. Schedule an appointment as soon as possible for a visit in 1 week(s).   Specialty: Family Medicine Why: with repeat cbc/bmp Contact information: [place] [address] [phone]        Zola Button  N, MD Follow up.   Specialty: Oncology Why: keep scheduled appointment Contact information: [address] [place] [address] [phone]          Allergies  Allergen Reactions  . Ace Inhibitors Other (See Comments)    Solitary kidney, experienced severe hyperkalemia on lisinopril (despite combined with HCTZ, no additional KCl)  . Angiotensin Receptor Blockers Other (See Comments)    See ACEi  . Penicillins Rash and Other (See Comments)    Has patient had a PCN reaction causing immediate rash, facial/tongue/throat swelling,  SOB or lightheadedness with hypotension: YES Has patient had a PCN reaction causing severe rash involving mucus membranes or skin necrosis: No Has patient had a PCN reaction that required hospitalization No Has patient had a PCN reaction occurring within the last 10 years: Yes  If all of the above answers are "NO", then may proceed with Cephalosporin use.    Consultations: Spoke to nephrology/Dr. Jonnie Finner on phone on 01/14/2020.  Communicated with Dr. Jonnie Finner again on 01/15/2020.   Procedures/Studies: CT Abdomen Pelvis W Wo Contrast  Result Date: 01/14/2020 CLINICAL DATA:  Patient with history of metastatic renal cell carcinoma. Follow-up exam. EXAM: CT CHEST WITH CONTRAST CT ABDOMEN AND PELVIS WITH AND WITHOUT CONTRAST TECHNIQUE: Multidetector CT imaging of the chest was performed during intravenous contrast administration. Multidetector CT imaging of the abdomen and pelvis was performed following the standard protocol before and during bolus administration of intravenous contrast. CONTRAST:  54mL OMNIPAQUE IOHEXOL 300 MG/ML  SOLN COMPARISON:  CT abdomen pelvis October 17, 2019; CT C AP March 10, 2019 FINDINGS: CT CHEST FINDINGS Cardiovascular: Normal heart size. Trace fluid superior pericardial recess. Thoracic aortic vascular calcifications. Multiple collateral vessels opacified within the right hemithorax. Mediastinum/Nodes: Normal appearance of the esophagus. No enlarged axillary lymphadenopathy. Slight interval increase in size of left hilar node measuring 1.3 cm (image 57; series 4), previously 1.2 cm. Lungs/Pleura: Mucous demonstrated within the tracheal air column. Interval increase in size of bilateral pulmonary nodules. Reference right middle lobe pulmonary nodule measures 1.9 x 1.5 cm, previously 1.6 x 1.2 cm. Reference right lower lobe pulmonary nodule measures 1.7 cm (image 89; series 9), previously 1.2 cm. Reference left lower lobe nodule measures 1.1 cm (image 94; series 9), previously 0.8 cm. No  pleural effusion or pneumothorax. Musculoskeletal: Thoracic spine degenerative changes. No aggressive or acute appearing osseous lesions. CT ABDOMEN AND PELVIS FINDINGS Hepatobiliary: Liver is normal in size and contour. No focal hepatic lesion is identified. Prior cholecystectomy. No intrahepatic or extrahepatic biliary ductal dilatation. Pancreas: Unremarkable Spleen: Interval decrease in size of heterogeneous lesion within the spleen measuring 2.8 x 2.0 cm (image 40; series 5), previously 2.7 x 3.5 cm. Adrenals/Urinary Tract: Similar-appearing left greater than right adrenal nodularity with the left adrenal gland measuring up to 2.3 cm (image 56; series 5). Postsurgical changes compatible with left nephrectomy. Interval increase in size of nodule within the left nephrectomy bed measuring 2.6 x 1.9 cm (image 68; series 5), previously 1.6 x 1.2 cm. Right kidney enhances appropriately with contrast. Similar-appearing 1.3 cm dense exophytic lesion inferior pole right kidney. Urinary bladder is unremarkable. Stomach/Bowel: Normal morphology of the stomach. No evidence for small bowel obstruction. Left anterior abdominal wall colostomy. No free fluid or free intraperitoneal air. Vascular/Lymphatic: Normal caliber abdominal aorta. Peripheral calcified atherosclerotic plaque. No retroperitoneal lymphadenopathy. Reproductive: Heterogeneous prostate. Other: Similar to mild interval increase in size of mass within the right hemiabdomen measuring up to 7.7 x 7.1 cm, previously 7.3 x 7.0  cm. Slight interval increase in size of nodule within the anterior right hemiabdomen measuring 3.6 x 2.3 cm (image 90; series 5), previously 2.8 x 2.0 cm. Interval increase in size of nodule within the right hemipelvis measuring 1.8 cm (image 128; series 5), previously 1.4 cm. Increased additional scattered nodules within the pelvis. Increased metastatic nodule within the left upper quadrant measuring 1.3 cm (image 57; series 5), previously  0.9 cm. Musculoskeletal: Lower thoracic and lumbar spine degenerative changes. No aggressive or acute appearing osseous lesions. IMPRESSION: 1. Interval increase in size of bilateral metastatic pulmonary nodules. 2. Similar to mild interval increase in size of mass within the right hemiabdomen. 3. Interval increase in size of nodule within the left nephrectomy bed. 4. Increased metastatic nodularity within the abdomen and pelvis. 5. Aortic atherosclerosis. Electronically Signed   By: Lovey Newcomer M.D.   On: 01/14/2020 12:20   CT Chest W Contrast  Result Date: 01/14/2020 CLINICAL DATA:  Patient with history of metastatic renal cell carcinoma. Follow-up exam. EXAM: CT CHEST WITH CONTRAST CT ABDOMEN AND PELVIS WITH AND WITHOUT CONTRAST TECHNIQUE: Multidetector CT imaging of the chest was performed during intravenous contrast administration. Multidetector CT imaging of the abdomen and pelvis was performed following the standard protocol before and during bolus administration of intravenous contrast. CONTRAST:  12mL OMNIPAQUE IOHEXOL 300 MG/ML  SOLN COMPARISON:  CT abdomen pelvis October 17, 2019; CT C AP March 10, 2019 FINDINGS: CT CHEST FINDINGS Cardiovascular: Normal heart size. Trace fluid superior pericardial recess. Thoracic aortic vascular calcifications. Multiple collateral vessels opacified within the right hemithorax. Mediastinum/Nodes: Normal appearance of the esophagus. No enlarged axillary lymphadenopathy. Slight interval increase in size of left hilar node measuring 1.3 cm (image 57; series 4), previously 1.2 cm. Lungs/Pleura: Mucous demonstrated within the tracheal air column. Interval increase in size of bilateral pulmonary nodules. Reference right middle lobe pulmonary nodule measures 1.9 x 1.5 cm, previously 1.6 x 1.2 cm. Reference right lower lobe pulmonary nodule measures 1.7 cm (image 89; series 9), previously 1.2 cm. Reference left lower lobe nodule measures 1.1 cm (image 94; series 9), previously 0.8  cm. No pleural effusion or pneumothorax. Musculoskeletal: Thoracic spine degenerative changes. No aggressive or acute appearing osseous lesions. CT ABDOMEN AND PELVIS FINDINGS Hepatobiliary: Liver is normal in size and contour. No focal hepatic lesion is identified. Prior cholecystectomy. No intrahepatic or extrahepatic biliary ductal dilatation. Pancreas: Unremarkable Spleen: Interval decrease in size of heterogeneous lesion within the spleen measuring 2.8 x 2.0 cm (image 40; series 5), previously 2.7 x 3.5 cm. Adrenals/Urinary Tract: Similar-appearing left greater than right adrenal nodularity with the left adrenal gland measuring up to 2.3 cm (image 56; series 5). Postsurgical changes compatible with left nephrectomy. Interval increase in size of nodule within the left nephrectomy bed measuring 2.6 x 1.9 cm (image 68; series 5), previously 1.6 x 1.2 cm. Right kidney enhances appropriately with contrast. Similar-appearing 1.3 cm dense exophytic lesion inferior pole right kidney. Urinary bladder is unremarkable. Stomach/Bowel: Normal morphology of the stomach. No evidence for small bowel obstruction. Left anterior abdominal wall colostomy. No free fluid or free intraperitoneal air. Vascular/Lymphatic: Normal caliber abdominal aorta. Peripheral calcified atherosclerotic plaque. No retroperitoneal lymphadenopathy. Reproductive: Heterogeneous prostate. Other: Similar to mild interval increase in size of mass within the right hemiabdomen measuring up to 7.7 x 7.1 cm, previously 7.3 x 7.0 cm. Slight interval increase in size of nodule within the anterior right hemiabdomen measuring 3.6 x 2.3 cm (image 90; series 5), previously 2.8 x 2.0 cm.  Interval increase in size of nodule within the right hemipelvis measuring 1.8 cm (image 128; series 5), previously 1.4 cm. Increased additional scattered nodules within the pelvis. Increased metastatic nodule within the left upper quadrant measuring 1.3 cm (image 57; series 5),  previously 0.9 cm. Musculoskeletal: Lower thoracic and lumbar spine degenerative changes. No aggressive or acute appearing osseous lesions. IMPRESSION: 1. Interval increase in size of bilateral metastatic pulmonary nodules. 2. Similar to mild interval increase in size of mass within the right hemiabdomen. 3. Interval increase in size of nodule within the left nephrectomy bed. 4. Increased metastatic nodularity within the abdomen and pelvis. 5. Aortic atherosclerosis. Electronically Signed   By: Lovey Newcomer M.D.   On: 01/14/2020 12:20       Subjective: Patient seen and examined at bedside. Wife present at bedside as well. He is a poor historian but awake. Feels okay to go home today. He has chronic speech difficulties. No overnight fever, nausea, vomiting or worsening shortness of breath.  Discharge Exam: Vitals:   01/15/20 2121 01/16/20 0535  BP: 134/74 140/69  Pulse: 82 73  Resp: 18 18  Temp: 97.9 F (36.6 C) 98.4 F (36.9 C)  SpO2: 96% 93%    General exam: No acute distress.  Looks chronically ill.  Poor historian. Respiratory system: Bilateral decreased breath sounds at bases with some scattered crackles Cardiovascular system: Rate controlled, S1-S2 heard Gastrointestinal system: Abdomen is nondistended, soft and nontender, colostomy bag in place.  Bowel sounds are heard.   Extremities: Trace lower extremity edema.  No cyanosis Central nervous system: Awake, has chronic speech difficulties, poor historian.  Patient is quadriplegic.  No changes in neurological status    The results of significant diagnostics from this hospitalization (including imaging, microbiology, ancillary and laboratory) are listed below for reference.     Microbiology: Recent Results (from the past 240 hour(s))  SARS Coronavirus 2 by RT PCR (hospital order, performed in [facility] hospital lab) Nasopharyngeal Nasopharyngeal Swab     Status: None   Collection Time: 01/13/20  9:37 PM   Specimen:  Nasopharyngeal Swab  Result Value Ref Range Status   SARS Coronavirus 2 NEGATIVE NEGATIVE Final    Comment: (NOTE) SARS-CoV-2 target nucleic acids are NOT DETECTED. The SARS-CoV-2 RNA is generally detectable in upper and lower respiratory specimens during the acute phase of infection. The lowest concentration of SARS-CoV-2 viral copies this assay can detect is 250 copies / mL. A negative result does not preclude SARS-CoV-2 infection and should not be used as the sole basis for treatment or other patient management decisions.  A negative result may occur with improper specimen collection / handling, submission of specimen other than nasopharyngeal swab, presence of viral mutation(s) within the areas targeted by this assay, and inadequate number of viral copies (<250 copies / mL). A negative result must be combined with clinical observations, patient history, and epidemiological information. Fact Sheet for Patients:   StrictlyIdeas.no Fact Sheet for Healthcare Providers: BankingDealers.co.za This test is not yet approved or cleared  by the [place] FDA and has been authorized for detection and/or diagnosis of SARS-CoV-2 by FDA under an Emergency Use Authorization (EUA).  This EUA will remain in effect (meaning this test can be used) for the duration of the COVID-19 declaration under Section 564(b)(1) of the Act, 21 U.S.C. section 360bbb-3(b)(1), unless the authorization is terminated or revoked sooner. Performed at [facility], [place] [address], [place], [place] [address]   Urine culture  Status: Abnormal   Collection Time: 01/13/20 10:11 PM   Specimen: Urine, Clean Catch  Result Value Ref Range Status   Specimen Description   Final    URINE, CLEAN CATCH Performed at [facility], [place] [address] [place], [place] [address]    Special Requests   Final    NONE Performed at [facility], [place] [address] [place], [place] [address]    Culture >=100,000 COLONIES/mL SERRATIA MARCESCENS (A)  Final   Report Status 01/16/2020 FINAL  Final   Organism ID, Bacteria SERRATIA MARCESCENS (A)  Final      Susceptibility   Serratia marcescens - MIC*    CEFAZOLIN >=64 RESISTANT Resistant     CEFTRIAXONE <=1 SENSITIVE Sensitive     CIPROFLOXACIN <=0.25 SENSITIVE Sensitive     GENTAMICIN <=1 SENSITIVE Sensitive     NITROFURANTOIN 128 RESISTANT Resistant     TRIMETH/SULFA <=20 SENSITIVE Sensitive     * >=100,000 COLONIES/mL SERRATIA MARCESCENS     Labs: BNP (last 3 results) No results for input(s): BNP in the last 8760 hours. Basic Metabolic Panel: Recent Labs  Lab 01/14/20 1737 01/14/20 2228 01/15/20 0419 01/15/20 1830 01/16/20 0440  NA 137 141 141 140 136  K 5.7* 5.4* 5.0 4.6 4.0  CL 113* 116* 118* 113* 108  CO2 16* 18* 17* 17* 20*  GLUCOSE 118* 100* 89 184* 136*  BUN 36* 34* 32* 29* 25*  CREATININE 1.48* 1.56* 1.37* 1.45* 1.24  CALCIUM 8.4* 8.5* 8.5* 8.1* 7.7*  MG  --   --  2.0  --  1.8   Liver Function Tests: Recent Labs  Lab 01/13/20 1329 01/13/20 1654 01/15/20 0419  AST 12* 16 13*  ALT 16 18 15   ALKPHOS 69 67 52  BILITOT 0.3 0.6 0.3  PROT 7.9 8.4* 6.5  ALBUMIN 3.3* 3.9 3.0*   No results for input(s): LIPASE, AMYLASE in the last 168 hours. No results for input(s): AMMONIA in the last 168 hours. CBC: Recent Labs  Lab 01/13/20 1329 01/13/20 1654 01/14/20 0450 01/16/20 0440  WBC 10.6* 11.6* 10.3 8.6  NEUTROABS 7.8*  --   --   --   HGB 11.5* 12.6* 10.7* 9.5*  HCT 35.9* 41.0 36.2* 30.3*  MCV 91.1 94.0 97.8 91.3  PLT 258 265 185 204   Cardiac Enzymes: No results for input(s): CKTOTAL, CKMB, CKMBINDEX, TROPONINI in the last 168 hours. BNP: Invalid input(s): POCBNP CBG: No results for input(s): GLUCAP in the last 168 hours. D-Dimer No results for input(s): DDIMER in the last 72 hours. Hgb A1c No results for input(s): HGBA1C in  the last 72 hours. Lipid Profile No results for input(s): CHOL, HDL, LDLCALC, TRIG, CHOLHDL, LDLDIRECT in the last 72 hours. Thyroid function studies No results for input(s): TSH, T4TOTAL, T3FREE, THYROIDAB in the last 72 hours.  Invalid input(s): FREET3 Anemia work up No results for input(s): VITAMINB12, FOLATE, FERRITIN, TIBC, IRON, RETICCTPCT in the last 72 hours. Urinalysis    Component Value Date/Time   COLORURINE YELLOW 01/13/2020 1813   APPEARANCEUR HAZY (A) 01/13/2020 1813   LABSPEC 1.018 01/13/2020 1813   PHURINE 5.0 01/13/2020 1813   GLUCOSEU NEGATIVE 01/13/2020 1813   HGBUR SMALL (A) 01/13/2020 1813   HGBUR negative 02/01/2010 1022   BILIRUBINUR NEGATIVE 01/13/2020 1813   BILIRUBINUR Negative 10/13/2018 1014   KETONESUR NEGATIVE 01/13/2020 1813   PROTEINUR NEGATIVE 01/13/2020 1813   UROBILINOGEN 0.2 10/13/2018 1014   UROBILINOGEN 0.2 02/01/2010 1022   NITRITE POSITIVE (  A) 01/13/2020 1813   LEUKOCYTESUR LARGE (A) 01/13/2020 1813   Sepsis Labs Invalid input(s): PROCALCITONIN,  WBC,  LACTICIDVEN Microbiology Recent Results (from the past 240 hour(s))  SARS Coronavirus 2 by RT PCR (hospital order, performed in [facility] hospital lab) Nasopharyngeal Nasopharyngeal Swab     Status: None   Collection Time: 01/13/20  9:37 PM   Specimen: Nasopharyngeal Swab  Result Value Ref Range Status   SARS Coronavirus 2 NEGATIVE NEGATIVE Final    Comment: (NOTE) SARS-CoV-2 target nucleic acids are NOT DETECTED. The SARS-CoV-2 RNA is generally detectable in upper and lower respiratory specimens during the acute phase of infection. The lowest concentration of SARS-CoV-2 viral copies this assay can detect is 250 copies / mL. A negative result does not preclude SARS-CoV-2 infection and should not be used as the sole basis for treatment or other patient management decisions.  A negative result may occur with improper specimen collection / handling, submission of specimen other than  nasopharyngeal swab, presence of viral mutation(s) within the areas targeted by this assay, and inadequate number of viral copies (<250 copies / mL). A negative result must be combined with clinical observations, patient history, and epidemiological information. Fact Sheet for Patients:   StrictlyIdeas.no Fact Sheet for Healthcare Providers: BankingDealers.co.za This test is not yet approved or cleared  by the [place] FDA and has been authorized for detection and/or diagnosis of SARS-CoV-2 by FDA under an Emergency Use Authorization (EUA).  This EUA will remain in effect (meaning this test can be used) for the duration of the COVID-19 declaration under Section 564(b)(1) of the Act, 21 U.S.C. section 360bbb-3(b)(1), unless the authorization is terminated or revoked sooner. Performed at [facility], [place] [address] [place], [place] [address]   Urine culture     Status: Abnormal   Collection Time: 01/13/20 10:11 PM   Specimen: Urine, Clean Catch  Result Value Ref Range Status   Specimen Description   Final    URINE, CLEAN CATCH Performed at [facility], [place] [address], [place], [place] [address]    Special Requests   Final    NONE Performed at [facility], [place] [address] [place], [place] [address]    Culture >=100,000 COLONIES/mL SERRATIA MARCESCENS (A)  Final   Report Status 01/16/2020 FINAL  Final   Organism ID, Bacteria SERRATIA MARCESCENS (A)  Final      Susceptibility   Serratia marcescens - MIC*    CEFAZOLIN >=64 RESISTANT Resistant     CEFTRIAXONE <=1 SENSITIVE Sensitive     CIPROFLOXACIN <=0.25 SENSITIVE Sensitive     GENTAMICIN <=1 SENSITIVE Sensitive     NITROFURANTOIN 128 RESISTANT Resistant     TRIMETH/SULFA <=20 SENSITIVE Sensitive     * >=100,000 COLONIES/mL SERRATIA MARCESCENS     Time coordinating discharge: 35 minutes  SIGNED:   Aline August, MD  [facility] 01/16/2020, 9:51 AM

## 2020-01-19 ENCOUNTER — Inpatient Hospital Stay: Payer: PPO | Admitting: Oncology

## 2020-01-19 ENCOUNTER — Telehealth: Payer: Self-pay | Admitting: Oncology

## 2020-01-19 ENCOUNTER — Other Ambulatory Visit: Payer: Self-pay

## 2020-01-19 VITALS — BP 144/64 | HR 81 | Temp 98.9°F | Resp 16 | Ht 72.0 in

## 2020-01-19 DIAGNOSIS — Z905 Acquired absence of kidney: Secondary | ICD-10-CM | POA: Diagnosis not present

## 2020-01-19 DIAGNOSIS — C649 Malignant neoplasm of unspecified kidney, except renal pelvis: Secondary | ICD-10-CM

## 2020-01-19 DIAGNOSIS — Z923 Personal history of irradiation: Secondary | ICD-10-CM | POA: Diagnosis not present

## 2020-01-19 DIAGNOSIS — C7801 Secondary malignant neoplasm of right lung: Secondary | ICD-10-CM | POA: Diagnosis not present

## 2020-01-19 DIAGNOSIS — Z79899 Other long term (current) drug therapy: Secondary | ICD-10-CM | POA: Diagnosis not present

## 2020-01-19 DIAGNOSIS — C7802 Secondary malignant neoplasm of left lung: Secondary | ICD-10-CM | POA: Diagnosis not present

## 2020-01-19 NOTE — Telephone Encounter (Signed)
Scheduled appt per 6/10 los.  Spoke with pt wife and she is aware of the appt dates and time.

## 2020-01-19 NOTE — Progress Notes (Signed)
Hematology and Oncology Follow Up Visit  Austin [name] [id number] 1956-05-19 64 y.o. 01/19/2020 8:46 AM [name], [name], MDBurchette, [name], MD   Principle Diagnosis: 64 year old man with renal cell carcinoma diagnosed in 2013.  He developed stage IV disease in 2015 was enlarging peritoneal and pulmonary involvement.   Prior Therapy:   1. He is status post robotic-assisted laparoscopic nephrectomy on the left done on 04/23/2012. The pathology revealed renal cell carcinoma with a grade 3/4 with the pathological staging of T3a.   2. Votrient 800 mg daily started around 12/27/2013. This was reduced to 400 mg subsequently and have been discontinued since 02/03/2014 due to poor tolerance.  3.  He is status post radiation therapy to the left upper arm.  He received 40 Gray in 10 fractions completed on November 11, 2019.  Current therapy: Active surveillance after declining further anticancer therapy.  Interim History: Austin [name] presents today for a repeat evaluation.  Since the last visit, he was hospitalized briefly last week after routine laboratory testing showed hyperkalemia and worsening renal insufficiency likely related to blood pressure medication.  He was discharged after correcting his potassium and acidosis.  Since his discharge, he reports no major changes in his health.  He is reporting more fatigue tiredness and increased overall generalized pain.  He has reported pain in his neck as well as left arm despite radiation therapy.  He has been using tramadol once to twice a day with some relief.     Medications: Reviewed without changes. Current Outpatient Medications  Medication Sig Dispense Refill  . acetaminophen (TYLENOL) 500 MG tablet Take 1 tablet (500 mg total) by mouth every 6 (six) hours as needed for mild pain, moderate pain, fever or headache. 30 tablet 0  . amLODipine (NORVASC) 10 MG tablet Take 1 tablet (10 mg total) by mouth daily. 90 tablet 3  . BD DISP NEEDLES 18G  X 1-1/2" MISC USE TO INJECT TESTOSTERONE INTO THE MUSCLE AS DIRECTED EVERY 14 DAYS 6 each 0  . cefpodoxime (VANTIN) 200 MG tablet Take 1 tablet (200 mg total) by mouth 2 (two) times daily for 5 days. Patient has tolerated Ceftriaxone in the hospital. Ok to give [place]. 10 tablet 0  . escitalopram (LEXAPRO) 10 MG tablet TAKE 1 TABLET BY MOUTH IN THE MORNING (Patient taking differently: Take 10 mg by mouth daily. ) 90 tablet 0  . ketoconazole (NIZORAL) 2 % cream APPLY AS NEEDED TO RASH (Patient taking differently: Apply 1 application topically daily as needed for irritation. ) 60 g 0  . levothyroxine (SYNTHROID) 25 MCG tablet TAKE 1 TABLET BY MOUTH ONCE DAILY BEFORE BREAKFAST (Patient taking differently: Take 25 mcg by mouth daily before breakfast. ) 90 tablet 0  . Multiple Vitamin (MULTIVITAMIN WITH MINERALS) TABS tablet Take 1 tablet by mouth daily with breakfast.     . SAFETY-LOK 3CC SYR 22GX1.5" 22G X 1-1/2" 3 ML MISC USE TO INJECT 1 ML OF TESTOSTERONE EVERY 14 DAYS 6 each 0  . sodium bicarbonate 650 MG tablet Take 1 tablet (650 mg total) by mouth 2 (two) times daily for 15 days. 30 tablet 0  . testosterone enanthate (DELATESTRYL) 200 MG/ML injection INJECT 1 ML INTRAMUSCULARLY  EVERY TWO WEEKS (Patient taking differently: Inject 200 mg into the muscle every 14 (fourteen) days. ) 10 mL 1  . traMADol (ULTRAM) 50 MG tablet Take 1 tablet (50 mg total) by mouth every 6 (six) hours as needed for moderate pain.    . traZODone (  DESYREL) 100 MG tablet TAKE 1 TABLET BY MOUTH AT BEDTIME (Patient taking differently: Take 100 mg by mouth at bedtime. ) 90 tablet 0  . triamcinolone cream (KENALOG) 0.1 % Apply 1 application topically daily as needed (for rash).     No current facility-administered medications for this visit.     Allergies:  Allergies  Allergen Reactions  . Ace Inhibitors Other (See Comments)    Solitary kidney, experienced severe hyperkalemia on lisinopril (despite combined with HCTZ, no  additional KCl)  . Angiotensin Receptor Blockers Other (See Comments)    See ACEi  . Penicillins Rash and Other (See Comments)    Has patient had a PCN reaction causing immediate rash, facial/tongue/throat swelling, SOB or lightheadedness with hypotension: YES Has patient had a PCN reaction causing severe rash involving mucus membranes or skin necrosis: No Has patient had a PCN reaction that required hospitalization No Has patient had a PCN reaction occurring within the last 10 years: Yes  If all of the above answers are "NO", then may proceed with Cephalosporin use.        Physical Exam:  Blood pressure (!) 144/64, pulse 81, temperature 98.9 F (37.2 C), temperature source Temporal, resp. rate 16, height 6' (1.829 m), SpO2 98 %.   ECOG: 2      General appearance: Comfortable appearing without any discomfort Head: Normocephalic without any trauma Oropharynx: Mucous membranes are moist and pink without any thrush or ulcers. Eyes: Pupils are equal and round reactive to light. Lymph nodes: No cervical, supraclavicular, inguinal or axillary lymphadenopathy.   Heart:regular rate and rhythm.  S1 and S2 without leg edema. Lung: Clear without any rhonchi or wheezes.  No dullness to percussion. Abdomin: Soft, nontender, nondistended with good bowel sounds.  No hepatosplenomegaly. Musculoskeletal: No joint deformity or effusion.  Full range of motion noted. Neurological: No deficits noted on motor, sensory and deep tendon reflex exam. Skin: No petechial rash or dryness.  Appeared moist.          Lab Results: Lab Results  Component Value Date   WBC 8.6 01/16/2020   HGB 9.5 (L) 01/16/2020   HCT 30.3 (L) 01/16/2020   MCV 91.3 01/16/2020   PLT 204 01/16/2020     Chemistry      Component Value Date/Time   NA 136 01/16/2020 0440   NA 138 03/13/2017 0922   K 4.0 01/16/2020 0440   K 4.6 03/13/2017 0922   CL 108 01/16/2020 0440   CO2 20 (L) 01/16/2020 0440   CO2 24  03/13/2017 0922   BUN 25 (H) 01/16/2020 0440   BUN 27.4 (H) 03/13/2017 0922   CREATININE 1.24 01/16/2020 0440   CREATININE 1.60 (H) 01/13/2020 1329   CREATININE 0.87 06/05/2017 1643   CREATININE 1.3 03/13/2017 0922      Component Value Date/Time   CALCIUM 7.7 (L) 01/16/2020 0440   CALCIUM 9.3 03/13/2017 0922   ALKPHOS 52 01/15/2020 0419   ALKPHOS 91 03/13/2017 0922   AST 13 (L) 01/15/2020 0419   AST 12 (L) 01/13/2020 1329   AST 17 03/13/2017 0922   ALT 15 01/15/2020 0419   ALT 16 01/13/2020 1329   ALT 21 03/13/2017 0922   BILITOT 0.3 01/15/2020 0419   BILITOT 0.3 01/13/2020 1329   BILITOT 0.27 03/13/2017 0922     IMPRESSION: 1. Interval increase in size of bilateral metastatic pulmonary nodules. 2. Similar to mild interval increase in size of mass within the right hemiabdomen. 3. Interval increase in size of  nodule within the left nephrectomy bed. 4. Increased metastatic nodularity within the abdomen and pelvis. 5. Aortic atherosclerosis.    Impression and Plan:  64 year old man with:  1.  Clear-cell renal cell carcinoma diagnosed in 2013.  He developed stage IV disease and documented peritoneal and lung involvement.   CT scan obtained on 01/13/2020 was personally reviewed and showed continued progression of disease.  He has increase in his abdominal peritoneal disease as well as pulmonary nodules.  Treatment options were reviewed at this time.  At the cancer therapy whether in the form of immunotherapy, oral targeted therapy or combination of the above were reiterated at this time.  He has opted with no cancer treatment since 2015 and has been relatively asymptomatic at this time.  His overall performance status has been declining and he would be a poor candidate for anticancer therapy.  I think he will experience more complications and clinical benefit at this time.  I recommended supportive management only and consideration for hospice in the future.   2. Prognosis:  His disease is incurable and overall prognosis is poor given his overall comorbidities.  3.  Nodule in the left bicep muscle: Status post radiation therapy.  Has not improved at this time.  4.  Hyperkalemia: Resolved at this time with potassium normalizing on 01/16/2020.  5.  Pain: We have discussed strategies to improve his pain well including scheduling tramadol and possibly consideration to escalate to hydrocodone or oxycodone.  He has also experienced which we can investigate further imaging studies to rule out metastasis.  For the time being he has deferred this option.  He is agreeable to schedule tramadol and will continue to monitor his progress.  6. Followup: in 4 months for repeat evaluation.  30  minutes were dedicated to this visit.  The time was spent on reviewing imaging studies, disease status update and future plan of care discussion.   Zola Button, MD 6/10/20218:46 AM treated with Marlowe Kays

## 2020-01-20 ENCOUNTER — Other Ambulatory Visit: Payer: Self-pay | Admitting: Oncology

## 2020-01-23 ENCOUNTER — Other Ambulatory Visit: Payer: Self-pay

## 2020-01-23 DIAGNOSIS — Z933 Colostomy status: Secondary | ICD-10-CM | POA: Diagnosis not present

## 2020-01-24 ENCOUNTER — Other Ambulatory Visit: Payer: Self-pay

## 2020-01-24 ENCOUNTER — Encounter: Payer: Self-pay | Admitting: Family Medicine

## 2020-01-24 ENCOUNTER — Ambulatory Visit (INDEPENDENT_AMBULATORY_CARE_PROVIDER_SITE_OTHER): Payer: PPO | Admitting: Family Medicine

## 2020-01-24 VITALS — BP 122/58 | HR 76 | Temp 98.0°F

## 2020-01-24 DIAGNOSIS — R600 Localized edema: Secondary | ICD-10-CM

## 2020-01-24 DIAGNOSIS — D649 Anemia, unspecified: Secondary | ICD-10-CM | POA: Diagnosis not present

## 2020-01-24 DIAGNOSIS — E875 Hyperkalemia: Secondary | ICD-10-CM | POA: Diagnosis not present

## 2020-01-24 DIAGNOSIS — R6889 Other general symptoms and signs: Secondary | ICD-10-CM | POA: Diagnosis not present

## 2020-01-24 NOTE — Patient Instructions (Signed)
Potassium Content of Foods  Potassium is a mineral found in many foods and drinks. It affects how the heart works, and helps keep fluids and minerals balanced in the body. The amount of potassium you need each day depends on your age and any medical conditions you may have. Talk to your health care provider or dietitian about how much potassium you need. The following lists of foods provide the general serving size for foods and the approximate amount of potassium in each serving, listed in milligrams (mg). Actual values may vary depending on the product and how it is processed. High in potassium The following foods and beverages have 200 mg or more of potassium per serving:  Apricots (raw) - 2 have 200 mg of potassium.  Apricots (dry) - 5 have 200 mg of potassium.  Artichoke - 1 medium has 345 mg of potassium.  Avocado -  fruit has 245 mg of potassium.  Banana - 1 medium fruit has 425 mg of potassium.  Louisa or baked beans (canned) -  cup has 280 mg of potassium.  White beans (canned) -  cup has 595 mg potassium.  Beef roast - 3 oz has 320 mg of potassium.  Ground beef - 3 oz has 270 mg of potassium.  Beets (raw or cooked) -  cup has 260 mg of potassium.  Bran muffin - 2 oz has 300 mg of potassium.  Broccoli (cooked) -  cup has 230 mg of potassium.  Brussels sprouts -  cup has 250 mg of potassium.  Cantaloupe -  cup has 215 mg of potassium.  Cereal, 100% bran -  cup has 200-400 mg of potassium.  Cheeseburger -1 single fast food burger has 225-400 mg of potassium.  Chicken - 3 oz has 220 mg of potassium.  Clams (canned) - 3 oz has 535 mg of potassium.  Crab - 3 oz has 225 mg of potassium.  Dates - 5 have 270 mg of potassium.  Dried beans and peas -  cup has 300-475 mg of potassium.  Figs (dried) - 2 have 260 mg of potassium.  Fish (halibut, tuna, cod, snapper) - 3 oz has 480 mg of potassium.  Fish (salmon, haddock, swordfish, perch) - 3 oz has 300 mg of  potassium.  Fish (tuna, canned) - 3 oz has 200 mg of potassium.  [place] fries (fast food) - 3 oz has 470 mg of potassium.  Granola with fruit and nuts -  cup has 200 mg of potassium.  Grapefruit juice -  cup has 200 mg of potassium.  Honeydew melon -  cup has 200 mg of potassium.  Kale (raw) - 1 cup has 300 mg of potassium.  Kiwi - 1 medium fruit has 240 mg of potassium.  Kohlrabi, rutabaga, parsnips -  cup has 280 mg of potassium.  Lentils -  cup has 365 mg of potassium.  Mango - 1 each has 325 mg of potassium.  Milk (nonfat, low-fat, whole, buttermilk) - 1 cup has 350-380 mg of potassium.  Milk (chocolate) - 1 cup has 420 mg of potassium  Molasses - 1 Tbsp has 295 mg of potassium.  Mushrooms -  cup has 280 mg of potassium.  Nectarine - 1 each has 275 mg of potassium.  Nuts (almonds, peanuts, hazelnuts, [place], cashew, mixed) - 1 oz has 200 mg of potassium.  Nuts (pistachios) - 1 oz has 295 mg of potassium.  Orange - 1 fruit has 240 mg of potassium.  Orange juice -  cup has 235 mg of potassium.  Papaya -  medium fruit has 390 mg of potassium.  Peanut butter (chunky) - 2 Tbsp has 240 mg of potassium.  Peanut butter (smooth) - 2 Tbsp has 210 mg of potassium.  Pear - 1 medium (200 mg) of potassium.  Pomegranate - 1 whole fruit has 400 mg of potassium.  Pomegranate juice -  cup has 215 mg of potassium.  Pork - 3 oz has 350 mg of potassium.  Potato chips (salted) - 1 oz has 465 mg of potassium.  Potato (baked with skin) - 1 medium has 925 mg of potassium.  Potato (boiled) -  cup has 255 mg of potassium.  Potato (Mashed) -  cup has 330 mg of potassium.  Prune juice -  cup has 370 mg of potassium.  Prunes - 5 have 305 mg of potassium.  Pudding (chocolate) -  cup has 230 mg of potassium.  Pumpkin (canned) -  cup has 250 mg of potassium.  Raisins (seedless) -  cup has 270 mg of potassium.  Seeds (sunflower or pumpkin) - 1 oz has 240 mg of  potassium.  Soy milk - 1 cup has 300 mg of potassium.  Spinach (cooked) - 1/2 cup has 420 mg of potassium.  Spinach (canned) -  cup has 370 mg of potassium.  Sweet potato (baked with skin) - 1 medium has 450 mg of potassium.  Swiss chard -  cup has 480 mg of potassium.  Tomato or vegetable juice -  cup has 275 mg of potassium.  Tomato (sauce or puree) -  cup has 400-550 mg of potassium.  Tomato (raw) - 1 medium has 290 mg of potassium.  Tomato (canned) -  cup has 200-300 mg of potassium.  [place] - 3 oz has 250 mg of potassium.  Wheat germ - 1 oz has 250 mg of potassium.  Winter squash -  cup has 250 mg of potassium.  Yogurt (plain or fruited) - 6 oz has 260-435 mg of potassium.  Zucchini -  cup has 220 mg of potassium. Moderate in potassium The following foods and beverages have 50-200 mg of potassium per serving:  Apple - 1 fruit has 150 mg of potassium  Apple juice -  cup has 150 mg of potassium  Applesauce -  cup has 90 mg of potassium  Apricot nectar -  cup has 140 mg of potassium  Asparagus (small spears) -  cup has 155 mg of potassium  Asparagus (large spears) - 6 have 155 mg of potassium  Bagel (cinnamon raisin) - 1 four-inch bagel has 130 mg of potassium  Bagel (egg or plain) - 1 four- inch bagel has 70 mg of potassium  Beans (green) -  cup has 90 mg of potassium  Beans (yellow) -  cup has 190 mg of potassium  Beer, regular - 12 oz has 100 mg of potassium  Beets (canned) -  cup has 125 mg of potassium  Blackberries -  cup has 115 mg of potassium  Blueberries -  cup has 60 mg of potassium  Bread (whole wheat) - 1 slice has 70 mg of potassium  Broccoli (raw) -  cup has 145 mg of potassium  Cabbage -  cup has 150 mg of potassium  Carrots (cooked or raw) -  cup has 180 mg of potassium  Cauliflower (raw) -  cup has 150 mg of potassium  Celery (raw) -  cup has 155 mg of potassium  Cereal, bran  flakes -  cup has 120-150 mg  of potassium  Cheese (cottage) -  cup has 110 mg of potassium  Cherries - 10 have 150 mg of potassium  Chocolate - 1 oz bar has 165 mg of potassium  Coffee (brewed) - 6 oz has 90 mg of potassium  Corn -  cup or 1 ear has 195 mg of potassium  Cucumbers -  cup has 80 mg of potassium  Egg - 1 large egg has 60 mg of potassium  Eggplant -  cup has 60 mg of potassium  Endive (raw) -  cup has 80 mg of potassium  English muffin - 1 has 65 mg of potassium  Fish (ocean perch) - 3 oz has 192 mg of potassium  Frankfurter, beef or pork - 1 has 75 mg of potassium  Fruit cocktail -  cup has 115 mg of potassium  Grape juice -  cup has 170 mg of potassium  Grapefruit -  fruit has 175 mg of potassium  Grapes -  cup has 155 mg of potassium  Greens: kale, turnip, collard -  cup has 110-150 mg of potassium  Ice cream or frozen yogurt (chocolate) -  cup has 175 mg of potassium  Ice cream or frozen yogurt (vanilla) -  cup has 120-150 mg of potassium  Lemons, limes - 1 each has 80 mg of potassium  Lettuce - 1 cup has 100 mg of potassium  Mixed vegetables -  cup has 150 mg of potassium  Mushrooms, raw -  cup has 110 mg of potassium  Nuts (walnuts, pecans, or macadamia) - 1 oz has 125 mg of potassium  Oatmeal -  cup has 80 mg of potassium  Okra -  cup has 110 mg of potassium  Onions -  cup has 120 mg of potassium  Peach - 1 has 185 mg of potassium  Peaches (canned) -  cup has 120 mg of potassium  Pears (canned) -  cup has 120 mg of potassium  Peas, green (frozen) -  cup has 90 mg of potassium  Peppers (Green) -  cup has 130 mg of potassium  Peppers (Red) -  cup has 160 mg of potassium  Pineapple juice -  cup has 165 mg of potassium  Pineapple (fresh or canned) -  cup has 100 mg of potassium  Plums - 1 has 105 mg of potassium  Pudding, vanilla -  cup has 150 mg of potassium  Raspberries -  cup has 90 mg of potassium  Rhubarb -  cup has  115 mg of potassium  Rice, wild -  cup has 80 mg of potassium  Shrimp - 3 oz has 155 mg of potassium  Spinach (raw) - 1 cup has 170 mg of potassium  Strawberries -  cup has 125 mg of potassium  Summer squash -  cup has 175-200 mg of potassium  Swiss chard (raw) - 1 cup has 135 mg of potassium  Tangerines - 1 fruit has 140 mg of potassium  Tea, brewed - 6 oz has 65 mg of potassium  Turnips -  cup has 140 mg of potassium  Watermelon -  cup has 85 mg of potassium  Wine (Red, table) - 5 oz has 180 mg of potassium  Wine (White, table) - 5 oz 100 mg of potassium Low in potassium The following foods and beverages have less than 50 mg of potassium per serving.  Bread (white) - 1 slice has 30 mg of potassium  Carbonated beverages - 12 oz has less than 5 mg of potassium  Cheese - 1 oz has 20-30 mg of potassium  Cranberries -  cup has 45 mg of potassium  Cranberry juice cocktail -  cup has 20 mg of potassium  Fats and oils - 1 Tbsp has less than 5 mg of potassium  Hummus - 1 Tbsp has 32 mg of potassium  Nectar (papaya, mango, or pear) -  cup has 35 mg of potassium  Rice (white or brown) -  cup has 50 mg of potassium  Spaghetti or macaroni (cooked) -  cup has 30 mg of potassium  Tortilla, flour or corn - 1 has 50 mg of potassium  Waffle - 1 four-inch waffle has 50 mg of potassium  Water chestnuts -  cup has 40 mg of potassium Summary  Potassium is a mineral found in many foods and drinks. It affects how the heart works, and helps keep fluids and minerals balanced in the body.  The amount of potassium you need each day depends on your age and any existing medical conditions you may have. Your health care provider or dietitian may recommend an amount of potassium that you should have each day. This information is not intended to replace advice given to you by your health care provider. Make sure you discuss any questions you have with your health care  provider. Document Revised: 07/10/2017 Document Reviewed: 10/22/2016 Elsevier Patient Education  [place].

## 2020-01-24 NOTE — Progress Notes (Signed)
Established Patient Office Visit  Subjective:  Patient ID: Austin [name], male    DOB: 01-06-1956  Age: 64 y.o. MRN: [id number]  CC:  Chief Complaint  Patient presents with  . Hospitalization Follow-up    Pt is here to follow up hospital visit for hyperkaliema     HPI Austin [name] presents for hospital follow-up.  He has hypertension and had been on lisinopril HCTZ for quite some time.  He had some electrolytes done through oncology in preparation for scan and potassium came back 7.2.  He was sent promptly to the hospital.  He had peaked T waves as expected on EKG.  He was treated with insulin, IV calcium gluconate, D50 on presentation.  He also received some Kayexalate.  His potassium levels came down promptly and were normal at discharge.  Had low sodium bicarbonate level.  Had been taking some ibuprofen on admission was advised not to use anymore that.  He had urine culture positive for Serratia marcescens.  He is likely colonized.  Apparently had 1 day of fever in the hospital.  He has completed outpatient antibiotics.  No recent fever.  Increased peripheral edema.  Has had some chronic leg edema.  On amlodipine which is likely contributing also recent albumin 3.0%.  Wife states that even when he was taking Lasix this did not seem to help much.  He had discharge hemoglobin 9.5.  This did drop down from around 12 range and may have been partly delusional.  Recommendation for outpatient follow-up CBC  He has cold intolerance.  He does take levothyroxine and had recent therapeutic TSH back in March.  Past Medical History:  Diagnosis Date  . 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 C. difficile colitis 02/08/2017  . Cerebrovascular accident ([place]) 1994   hx of brainstem stroke, residual diminished lung capacity  . Chronic kidney disease    Left Renal Mass  . 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 Diverticulitis   . Duodenal ulcer   . GIB  (gastrointestinal bleeding) 05/28/2017  . Hx of colonic polyps   . Hypertension    has been off BP meds since GI bleed was thought to have been caused by previously prescribed lisinopril   . Hypogonadism   . Intra-abdominal abscess ([place]) 01/23/2017  . Iron deficiency anemia   . 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 Open abdominal incision with drainage 01/26/2017  . Pituitary adenoma ([place])   . 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 Pneumonia   . PONV (postoperative nausea and vomiting)    vomited after colostomy placement   . Pre-diabetes    last a1c 5.6   . 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[place])   . Urinary incontinence   . Venous stasis    edema    Past Surgical History:  Procedure Laterality Date  . CHOLECYSTECTOMY    . COLON RESECTION N/A 05/04/2017   Procedure: LAPAROSCOPIC SIGMOID COLON RESECTION WITH END COLSOTOMY ERAS PATHWAY;  Surgeon: Alphonsa Overall, MD;  Location: [facility];  Service: General;  Laterality: N/A;  . COLONOSCOPY  02/27/2012   Procedure: COLONOSCOPY;  Surgeon: Jerene Bears, MD;  Location: [facility];  Service: Gastroenterology;  Laterality: N/A;  . COLONOSCOPY N/A 05/31/2017   Procedure: COLONOSCOPY;  Surgeon: Milus Banister, MD;  Location: [facility];  Service: Endoscopy;  Laterality: N/A;  . COLOSTOMY TAKEDOWN N/A 08/20/2017   Procedure: LAPAROSCOPIC ASSISTED REVISION END COLOSTOMY AND ENTEROLYSIS OF ADHESIONS;  Surgeon: Alphonsa Overall, MD;  Location: [facility];  Service: General;  Laterality: N/A;  ERAS PATHWAY  . ESOPHAGOGASTRODUODENOSCOPY N/A 05/31/2017   Procedure: ESOPHAGOGASTRODUODENOSCOPY (EGD);  Surgeon: Milus Banister, MD;  Location: [facility];  Service: Endoscopy;  Laterality: N/A;  . gamma knife radiation surgery  05/2010   for pituitary  . IR RADIOLOGIST EVAL & MGMT  02/12/2017  . IR RADIOLOGIST EVAL & MGMT  03/17/2017  . IR RADIOLOGIST  EVAL & MGMT  04/14/2017  . IR RADIOLOGIST EVAL & MGMT  02/05/2017  . PITUITARY SURGERY  2007   nose approach  . ROBOT ASSISTED LAPAROSCOPIC NEPHRECTOMY  04/23/2012   Procedure: ROBOTIC ASSISTED LAPAROSCOPIC NEPHRECTOMY;  Surgeon: Alexis Frock, MD;  Location: [facility];  Service: Urology;  Laterality: Left;  radical  . stomach peg  02/1993   removed 5-6 years later  . TRACHEOSTOMY TUBE PLACEMENT  02/1993   removed 04/1993  . UMBILICAL HERNIA REPAIR  04/23/2012   Procedure: HERNIA REPAIR UMBILICAL ADULT;  Surgeon: Alexis Frock, MD;  Location: [facility];  Service: Urology;;  . [name]  . vocal cord surgery     injected with collagen, then fat from stomach to improve speech s/p stroke    Family History  Problem Relation Age of Onset  . Alcohol abuse Father   . Throat cancer Father   . Esophageal cancer Father 53  . Arthritis Mother   . Hyperlipidemia Mother   . Uterine cancer Mother 39  . Colon cancer Brother   . Arthritis Maternal Grandmother   . Hypertension Brother   . Stroke Paternal Grandmother     Social History   Socioeconomic History  . Marital status: Married    Spouse name: Not on file  . Number of children: 3  . Years of education: Not on file  . Highest education level: Not on file  Occupational History  . Occupation: disabled    Fish farm manager: UNEMPLOYED    Employer: [employer]    Comment: Retired  . Occupation: Psychiatrist    Comment: retired  Tobacco Use  . Smoking status: Former Smoker    Packs/day: 0.50    Years: 23.00    Pack years: 11.50    Types: Cigarettes    Quit date: 09/24/1992    Years since quitting: 27.3  . Smokeless tobacco: Never Used  Vaping Use  . Vaping Use: Never used  Substance and Sexual Activity  . Alcohol use: No    Comment: rare  . Drug use: No  . Sexual activity: Not on file  Other Topics Concern  . Not on file  Social History Narrative   Retired, disabled 1994 Quadreplegic      10/13/2018:     Originally from [place].   Lives with wife on one level apartment. Wife Peter Congo is main caregiver.   Has 2 daughters, 1 son, 6 grandchildren.   Loves spending time with grandchildren, some of whom are local and visit frequently. Some family still in [place] with whom he face-times.   Attends church, active with church community with Peter Congo   Enjoys working on the computer, going outside in his motorized wheelchair   Social Determinants of Radio broadcast assistant Strain:   . Difficulty of Paying Living Expenses:   Food Insecurity:   . Worried About Charity fundraiser in  the Last Year:   . [place] in the Last Year:   Transportation Needs:   . Film/video editor (Medical):   Marland Kitchen Lack of Transportation (Non-Medical):   Physical Activity:   . Days of Exercise per Week:   . Minutes of Exercise per Session:   Stress:   . Feeling of Stress :   Social Connections:   . Frequency of Communication with Friends and Family:   . Frequency of Social Gatherings with Friends and Family:   . Attends Religious Services:   . Active Member of Clubs or Organizations:   . Attends Archivist Meetings:   Marland Kitchen Marital Status:   Intimate Partner Violence:   . Fear of Current or Ex-Partner:   . Emotionally Abused:   Marland Kitchen Physically Abused:   . Sexually Abused:     Outpatient Medications Prior to Visit  Medication Sig Dispense Refill  . acetaminophen (TYLENOL) 500 MG tablet Take 1 tablet (500 mg total) by mouth every 6 (six) hours as needed for mild pain, moderate pain, fever or headache. 30 tablet 0  . amLODipine (NORVASC) 10 MG tablet Take 1 tablet (10 mg total) by mouth daily. 90 tablet 3  . BD DISP NEEDLES 18G X 1-1/2" MISC USE TO INJECT TESTOSTERONE INTO THE MUSCLE AS DIRECTED EVERY 14 DAYS 6 each 0  . escitalopram (LEXAPRO) 10 MG tablet TAKE 1 TABLET BY MOUTH IN THE MORNING (Patient taking differently: Take 10 mg by mouth daily. ) 90 tablet 0  . ketoconazole (NIZORAL) 2 % cream APPLY AS  NEEDED TO RASH (Patient taking differently: Apply 1 application topically daily as needed for irritation. ) 60 g 0  . levothyroxine (SYNTHROID) 25 MCG tablet TAKE 1 TABLET BY MOUTH ONCE DAILY BEFORE BREAKFAST (Patient taking differently: Take 25 mcg by mouth daily before breakfast. ) 90 tablet 0  . Multiple Vitamin (MULTIVITAMIN WITH MINERALS) TABS tablet Take 1 tablet by mouth daily with breakfast.     . SAFETY-LOK 3CC SYR 22GX1.5" 22G X 1-1/2" 3 ML MISC USE TO INJECT 1 ML OF TESTOSTERONE EVERY 14 DAYS 6 each 0  . sodium bicarbonate 650 MG tablet Take 1 tablet (650 mg total) by mouth 2 (two) times daily for 15 days. 30 tablet 0  . testosterone enanthate (DELATESTRYL) 200 MG/ML injection INJECT 1 ML INTRAMUSCULARLY  EVERY TWO WEEKS (Patient taking differently: Inject 200 mg into the muscle every 14 (fourteen) days. ) 10 mL 1  . traMADol (ULTRAM) 50 MG tablet TAKE 1 TABLET BY MOUTH EVERY 6 HOURS AS NEEDED 60 tablet 0  . traZODone (DESYREL) 100 MG tablet TAKE 1 TABLET BY MOUTH AT BEDTIME (Patient taking differently: Take 100 mg by mouth at bedtime. ) 90 tablet 0  . triamcinolone cream (KENALOG) 0.1 % Apply 1 application topically daily as needed (for rash).     No facility-administered medications prior to visit.    Allergies  Allergen Reactions  . Ace Inhibitors Other (See Comments)    Solitary kidney, experienced severe hyperkalemia on lisinopril (despite combined with HCTZ, no additional KCl)  . Angiotensin Receptor Blockers Other (See Comments)    See ACEi  . Penicillins Rash and Other (See Comments)    Has patient had a PCN reaction causing immediate rash, facial/tongue/throat swelling, SOB or lightheadedness with hypotension: YES Has patient had a PCN reaction causing severe rash involving mucus membranes or skin necrosis: No Has patient had a PCN reaction that required hospitalization No Has patient had a PCN  reaction occurring within the last 10 years: Yes  If all of the above answers  are "NO", then may proceed with Cephalosporin use.    ROS Review of Systems  Constitutional: Negative for chills and fever.  Respiratory: Negative for cough and shortness of breath.   Cardiovascular: Positive for leg swelling. Negative for chest pain.  Endocrine: Positive for cold intolerance.  Neurological: Negative for dizziness and headaches.      Objective:    Physical Exam Vitals reviewed.  Constitutional:      Appearance: Normal appearance.  Cardiovascular:     Rate and Rhythm: Normal rate and regular rhythm.  Pulmonary:     Effort: Pulmonary effort is normal.     Breath sounds: Normal breath sounds.  Musculoskeletal:     Right lower leg: Edema present.     Left lower leg: Edema present.  Neurological:     Mental Status: He is alert.     BP (!) 122/58 (BP Location: Right Arm, Cuff Size: Normal)   Pulse 76   Temp 98 F (36.7 C) (Temporal)   SpO2 99%  Wt Readings from Last 3 Encounters:  01/13/20 180 lb (81.6 kg)  10/17/19 180 lb (81.6 kg)  01/12/18 170 lb (77.1 kg)     Health Maintenance Due  Topic Date Due  . Hepatitis C Screening  Never done  . OPHTHALMOLOGY EXAM  02/23/2019  . HEMOGLOBIN A1C  04/15/2019  . PNA vac Low Risk Adult (1 of 2 - PCV13) 09/25/2019  . TETANUS/TDAP  11/13/2019    There are no preventive care reminders to display for this patient.  Lab Results  Component Value Date   TSH 1.990 10/18/2019   Lab Results  Component Value Date   WBC 8.6 01/16/2020   HGB 9.5 (L) 01/16/2020   HCT 30.3 (L) 01/16/2020   MCV 91.3 01/16/2020   PLT 204 01/16/2020   Lab Results  Component Value Date   NA 136 01/16/2020   K 4.0 01/16/2020   CHLORIDE 106 03/13/2017   CO2 20 (L) 01/16/2020   GLUCOSE 136 (H) 01/16/2020   BUN 25 (H) 01/16/2020   CREATININE 1.24 01/16/2020   BILITOT 0.3 01/15/2020   ALKPHOS 52 01/15/2020   AST 13 (L) 01/15/2020   ALT 15 01/15/2020   PROT 6.5 01/15/2020   ALBUMIN 3.0 (L) 01/15/2020   CALCIUM 7.7 (L)  01/16/2020   ANIONGAP 8 01/16/2020   EGFR 56 (L) 03/13/2017   GFR 54.42 (L) 11/04/2019   Lab Results  Component Value Date   CHOL 193 04/14/2018   Lab Results  Component Value Date   HDL 31.50 (L) 04/14/2018   Lab Results  Component Value Date   LDLCALC 72 12/22/2016   Lab Results  Component Value Date   TRIG 313.0 (H) 04/14/2018   Lab Results  Component Value Date   CHOLHDL 6 04/14/2018   Lab Results  Component Value Date   HGBA1C 5.9 (A) 10/13/2018      Assessment & Plan:   #1 recent hyperkalemia.  Patient taken off lisinopril.  He had been on this for a year and never had similar problems previously.  Would avoid all future use of nonsteroidals, ACE inhibitors, ARB's -Recheck basic metabolic panel today  #2 chronic bilateral leg edema.  Suspect multifactorial.  He has likely venous stasis, amlodipine therapy, low albumin -Elevate legs frequently. -Try to avoid further diuretics at this point.  Consider low-dose torsemide if edema worsens  #3 history of positive urine culture  for Serratia organism.  He has neurogenic bladder and is likely colonized. -Avoid routine urine cultures unless he has fever or other suggestions of infection  #4 normocytic anemia -Recheck CBC  #5 cold intolerance.  He has known hypothyroidism.  Recent TSH at goal back in March.  Wife requesting recheck levels today -Check TSH  No orders of the defined types were placed in this encounter.  Total of 40 minutes were spent reviewing hospital records, reviewing medications, assessing patient today, and discussing future work-up  Follow-up: Return in about 3 months (around 04/25/2020).    Carolann Littler, MD

## 2020-01-25 ENCOUNTER — Other Ambulatory Visit (INDEPENDENT_AMBULATORY_CARE_PROVIDER_SITE_OTHER): Payer: PPO

## 2020-01-25 DIAGNOSIS — D649 Anemia, unspecified: Secondary | ICD-10-CM | POA: Diagnosis not present

## 2020-01-25 DIAGNOSIS — E875 Hyperkalemia: Secondary | ICD-10-CM | POA: Diagnosis not present

## 2020-01-25 DIAGNOSIS — R6889 Other general symptoms and signs: Secondary | ICD-10-CM | POA: Diagnosis not present

## 2020-01-25 LAB — CBC WITH DIFFERENTIAL/PLATELET
Basophils Absolute: 0.1 10*3/uL (ref 0.0–0.1)
Basophils Relative: 1.1 % (ref 0.0–3.0)
Eosinophils Absolute: 0.5 10*3/uL (ref 0.0–0.7)
Eosinophils Relative: 4.5 % (ref 0.0–5.0)
HCT: 30.2 % — ABNORMAL LOW (ref 39.0–52.0)
Hemoglobin: 10.2 g/dL — ABNORMAL LOW (ref 13.0–17.0)
Lymphocytes Relative: 11 % — ABNORMAL LOW (ref 12.0–46.0)
Lymphs Abs: 1.1 10*3/uL (ref 0.7–4.0)
MCHC: 33.7 g/dL (ref 30.0–36.0)
MCV: 87.3 fl (ref 78.0–100.0)
Monocytes Absolute: 1.1 10*3/uL — ABNORMAL HIGH (ref 0.1–1.0)
Monocytes Relative: 10.4 % (ref 3.0–12.0)
Neutro Abs: 7.6 10*3/uL (ref 1.4–7.7)
Neutrophils Relative %: 73 % (ref 43.0–77.0)
Platelets: 269 10*3/uL (ref 150.0–400.0)
RBC: 3.46 Mil/uL — ABNORMAL LOW (ref 4.22–5.81)
RDW: 15.4 % (ref 11.5–15.5)
WBC: 10.4 10*3/uL (ref 4.0–10.5)

## 2020-01-25 LAB — BASIC METABOLIC PANEL
BUN: 26 mg/dL — ABNORMAL HIGH (ref 6–23)
CO2: 24 mEq/L (ref 19–32)
Calcium: 8.8 mg/dL (ref 8.4–10.5)
Chloride: 104 mEq/L (ref 96–112)
Creatinine, Ser: 1.27 mg/dL (ref 0.40–1.50)
GFR: 56.86 mL/min — ABNORMAL LOW (ref >60.00)
Glucose, Bld: 92 mg/dL (ref 70–99)
Potassium: 5 mEq/L (ref 3.5–5.1)
Sodium: 138 mEq/L (ref 135–145)

## 2020-01-25 LAB — TSH: TSH: 7.57 u[IU]/mL — ABNORMAL HIGH (ref 0.35–4.50)

## 2020-01-27 ENCOUNTER — Other Ambulatory Visit: Payer: Self-pay

## 2020-01-27 DIAGNOSIS — E039 Hypothyroidism, unspecified: Secondary | ICD-10-CM

## 2020-01-27 MED ORDER — LEVOTHYROXINE SODIUM 50 MCG PO TABS
50.0000 ug | ORAL_TABLET | Freq: Every day | ORAL | 3 refills | Status: DC
Start: 2020-01-27 — End: 2020-08-07

## 2020-02-20 DIAGNOSIS — R32 Unspecified urinary incontinence: Secondary | ICD-10-CM | POA: Diagnosis not present

## 2020-02-21 ENCOUNTER — Other Ambulatory Visit: Payer: Self-pay | Admitting: Family Medicine

## 2020-02-21 NOTE — Telephone Encounter (Signed)
Please advise 

## 2020-03-06 ENCOUNTER — Ambulatory Visit (INDEPENDENT_AMBULATORY_CARE_PROVIDER_SITE_OTHER)
Admission: RE | Admit: 2020-03-06 | Discharge: 2020-03-06 | Disposition: A | Discharge status: Home / Self Care | Payer: PPO | Source: Ambulatory Visit | Attending: Family Medicine | Admitting: Family Medicine

## 2020-03-06 ENCOUNTER — Encounter: Payer: Self-pay | Admitting: Family Medicine

## 2020-03-06 ENCOUNTER — Other Ambulatory Visit: Payer: Self-pay

## 2020-03-06 ENCOUNTER — Ambulatory Visit (INDEPENDENT_AMBULATORY_CARE_PROVIDER_SITE_OTHER): Payer: PPO | Admitting: Family Medicine

## 2020-03-06 ENCOUNTER — Other Ambulatory Visit: Payer: Self-pay | Admitting: Family Medicine

## 2020-03-06 VITALS — BP 130/66 | HR 82 | Temp 98.0°F

## 2020-03-06 DIAGNOSIS — M542 Cervicalgia: Secondary | ICD-10-CM

## 2020-03-06 DIAGNOSIS — R1084 Generalized abdominal pain: Secondary | ICD-10-CM

## 2020-03-06 DIAGNOSIS — K5900 Constipation, unspecified: Secondary | ICD-10-CM | POA: Diagnosis not present

## 2020-03-06 DIAGNOSIS — R600 Localized edema: Secondary | ICD-10-CM | POA: Diagnosis not present

## 2020-03-06 DIAGNOSIS — R109 Unspecified abdominal pain: Secondary | ICD-10-CM | POA: Diagnosis not present

## 2020-03-06 MED ORDER — BACLOFEN 10 MG PO TABS
10.0000 mg | ORAL_TABLET | Freq: Three times a day (TID) | ORAL | 2 refills | Status: DC
Start: 2020-03-06 — End: 2020-03-21

## 2020-03-06 NOTE — Progress Notes (Signed)
Established Patient Office Visit  Subjective:  Patient ID: Austin [name], male    DOB: 01-19-1956  Age: 64 y.o. MRN: [id number]  CC:  Chief Complaint  Patient presents with  . Neck Pain    not getting any better, pain is getting worse  . Leg Swelling    bilateral foot swelling  . Abdominal Pain    more constant and stronger, getting worse    HPI Austin [name] presents for multiple issues as follows \ He has battled with bilateral leg edema for some time.  He is on amlodipine 10 mg daily.  Has taken Lasix in the past but not recently.  No increased dyspnea.  No orthopnea.  He is very sedentary because of his inability to ambulate which also contributes.  We had discussed last visit possibly reducing amlodipine if edema persisted.  They think he has gotten somewhat worse  He complains of neck pain.  This is somewhat bilateral and paracervical muscles mostly.  They feel like a lot of this may be muscle stiffness.  They have tried some topical massage and topical sports creams without improvement.  Does not describe any radiculitis symptoms.  He does have metastatic renal cell carcinoma which makes evaluating his pain issues challenging.  Has previously taken Flexeril several years ago but this made him extremely sedated for couple of days.  He complains of some decreased stool frequency.  Without taking MiraLAX or Dulcolax he frequently can go several days without bowel movement even with his colostomy.  They feel like he is drinking adequate fluids.  Tries to eat fiber.  He frequently feels bloated.  No recent fevers or chills. No nausea or vomiting.  Recent issues with cold intolerance.  His TSH was 7.57.  We increased his levothyroxine to 50 mcg.  We recommend a follow-up TSH in about 2 to 3 months.  He has not noted any change in his cold intolerance thus far with dosage increase.   Past Medical History:  Diagnosis Date  . 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 C. difficile colitis 02/08/2017  . Cerebrovascular accident ([place]) 1994   hx of brainstem stroke, residual diminished lung capacity  . Chronic kidney disease    Left Renal Mass  . 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 Diverticulitis   . Duodenal ulcer   . GIB (gastrointestinal bleeding) 05/28/2017  . Hx of colonic polyps   . Hypertension    has been off BP meds since GI bleed was thought to have been caused by previously prescribed lisinopril   . Hypogonadism   . Intra-abdominal abscess ([place]) 01/23/2017  . Iron deficiency anemia   . 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 Open abdominal incision with drainage 01/26/2017  . Pituitary adenoma ([place])   . 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 Pneumonia   . PONV (postoperative nausea and vomiting)    vomited after colostomy placement   . Pre-diabetes    last a1c 5.6   . 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[place])   . Urinary incontinence   . Venous stasis    edema    Past Surgical History:  Procedure Laterality Date  . CHOLECYSTECTOMY    . COLON RESECTION N/A 05/04/2017   Procedure: LAPAROSCOPIC SIGMOID COLON RESECTION WITH END COLSOTOMY ERAS PATHWAY;  Surgeon:  Alphonsa Overall, MD;  Location: [facility];  Service: General;  Laterality: N/A;  . COLONOSCOPY  02/27/2012   Procedure: COLONOSCOPY;  Surgeon: Jerene Bears, MD;  Location: [facility];  Service: Gastroenterology;  Laterality: N/A;  . COLONOSCOPY N/A 05/31/2017   Procedure: COLONOSCOPY;  Surgeon: Milus Banister, MD;  Location: [facility];  Service: Endoscopy;  Laterality: N/A;  . COLOSTOMY TAKEDOWN N/A 08/20/2017   Procedure: LAPAROSCOPIC ASSISTED REVISION END COLOSTOMY AND ENTEROLYSIS OF ADHESIONS;  Surgeon: Alphonsa Overall, MD;  Location: [facility];  Service: General;  Laterality: N/A;  ERAS PATHWAY  . ESOPHAGOGASTRODUODENOSCOPY N/A 05/31/2017    Procedure: ESOPHAGOGASTRODUODENOSCOPY (EGD);  Surgeon: Milus Banister, MD;  Location: [facility];  Service: Endoscopy;  Laterality: N/A;  . gamma knife radiation surgery  05/2010   for pituitary  . IR RADIOLOGIST EVAL & MGMT  02/12/2017  . IR RADIOLOGIST EVAL & MGMT  03/17/2017  . IR RADIOLOGIST EVAL & MGMT  04/14/2017  . IR RADIOLOGIST EVAL & MGMT  02/05/2017  . PITUITARY SURGERY  2007   nose approach  . ROBOT ASSISTED LAPAROSCOPIC NEPHRECTOMY  04/23/2012   Procedure: ROBOTIC ASSISTED LAPAROSCOPIC NEPHRECTOMY;  Surgeon: Alexis Frock, MD;  Location: [facility];  Service: Urology;  Laterality: Left;  radical  . stomach peg  02/1993   removed 5-6 years later  . TRACHEOSTOMY TUBE PLACEMENT  02/1993   removed 04/1993  . UMBILICAL HERNIA REPAIR  04/23/2012   Procedure: HERNIA REPAIR UMBILICAL ADULT;  Surgeon: Alexis Frock, MD;  Location: [facility];  Service: Urology;;  . [name]  . vocal cord surgery     injected with collagen, then fat from stomach to improve speech s/p stroke    Family History  Problem Relation Age of Onset  . Alcohol abuse Father   . Throat cancer Father   . Esophageal cancer Father 66  . Arthritis Mother   . Hyperlipidemia Mother   . Uterine cancer Mother 13  . Colon cancer Brother   . Arthritis Maternal Grandmother   . Hypertension Brother   . Stroke Paternal Grandmother     Social History   Socioeconomic History  . Marital status: Married    Spouse name: Not on file  . Number of children: 3  . Years of education: Not on file  . Highest education level: Not on file  Occupational History  . Occupation: disabled    Fish farm manager: UNEMPLOYED    Employer: [employer]    Comment: Retired  . Occupation: Psychiatrist    Comment: retired  Tobacco Use  . Smoking status: Former Smoker    Packs/day: 0.50    Years: 23.00    Pack years: 11.50    Types: Cigarettes    Quit date: 09/24/1992    Years since quitting: 27.4  . Smokeless  tobacco: Never Used  Vaping Use  . Vaping Use: Never used  Substance and Sexual Activity  . Alcohol use: No    Comment: rare  . Drug use: No  . Sexual activity: Not on file  Other Topics Concern  . Not on file  Social History Narrative   Retired, disabled 1994 Quadreplegic      10/13/2018:   Originally from [place].   Lives with wife on one level apartment. Wife Peter Congo is main caregiver.   Has 2 daughters, 1 son, 6 grandchildren.   Loves spending time with grandchildren, some of whom are local and visit frequently. Some family still in [place] with whom he  face-times.   Attends church, active with church community with Peter Congo   Enjoys working on the computer, going outside in his motorized wheelchair   Social Determinants of Radio broadcast assistant Strain:   . Difficulty of Paying Living Expenses:   Food Insecurity:   . Worried About Charity fundraiser in the Last Year:   . Arboriculturist in the Last Year:   Transportation Needs:   . Film/video editor (Medical):   Marland Kitchen Lack of Transportation (Non-Medical):   Physical Activity:   . Days of Exercise per Week:   . Minutes of Exercise per Session:   Stress:   . Feeling of Stress :   Social Connections:   . Frequency of Communication with Friends and Family:   . Frequency of Social Gatherings with Friends and Family:   . Attends Religious Services:   . Active Member of Clubs or Organizations:   . Attends Archivist Meetings:   Marland Kitchen Marital Status:   Intimate Partner Violence:   . Fear of Current or Ex-Partner:   . Emotionally Abused:   Marland Kitchen Physically Abused:   . Sexually Abused:     Outpatient Medications Prior to Visit  Medication Sig Dispense Refill  . acetaminophen (TYLENOL) 500 MG tablet Take 1 tablet (500 mg total) by mouth every 6 (six) hours as needed for mild pain, moderate pain, fever or headache. 30 tablet 0  . amLODipine (NORVASC) 10 MG tablet Take 1 tablet (10 mg total) by mouth daily. 90 tablet 3  . BD  DISP NEEDLES 18G X 1-1/2" MISC USE TO INJECT TESTOSTERONE INTO THE MUSCLE AS DIRECTED EVERY 14 DAYS 6 each 0  . escitalopram (LEXAPRO) 10 MG tablet TAKE 1 TABLET BY MOUTH IN THE MORNING (Patient taking differently: Take 10 mg by mouth daily. ) 90 tablet 0  . ketoconazole (NIZORAL) 2 % cream APPLY AS NEEDED TO RASH (Patient taking differently: Apply 1 application topically daily as needed for irritation. ) 60 g 0  . levothyroxine (SYNTHROID) 50 MCG tablet Take 1 tablet (50 mcg total) by mouth daily. 90 tablet 3  . Multiple Vitamin (MULTIVITAMIN WITH MINERALS) TABS tablet Take 1 tablet by mouth daily with breakfast.     . SAFETY-LOK 3CC SYR 22GX1.5" 22G X 1-1/2" 3 ML MISC USE TO INJECT 1 ML OF TESTOSTERONE EVERY 14 DAYS 6 each 0  . testosterone enanthate (DELATESTRYL) 200 MG/ML injection INJECT 1 ML INTRAMUSCULARLY  EVERY TWO WEEKS 10 mL 0  . traMADol (ULTRAM) 50 MG tablet TAKE 1 TABLET BY MOUTH EVERY 6 HOURS AS NEEDED 60 tablet 0  . traZODone (DESYREL) 100 MG tablet TAKE 1 TABLET BY MOUTH AT BEDTIME (Patient taking differently: Take 100 mg by mouth at bedtime. ) 90 tablet 0  . triamcinolone cream (KENALOG) 0.1 % Apply 1 application topically daily as needed (for rash).     No facility-administered medications prior to visit.    Allergies  Allergen Reactions  . Ace Inhibitors Other (See Comments)    Solitary kidney, experienced severe hyperkalemia on lisinopril (despite combined with HCTZ, no additional KCl)  . Angiotensin Receptor Blockers Other (See Comments)    See ACEi  . Penicillins Rash and Other (See Comments)    Has patient had a PCN reaction causing immediate rash, facial/tongue/throat swelling, SOB or lightheadedness with hypotension: YES Has patient had a PCN reaction causing severe rash involving mucus membranes or skin necrosis: No Has patient had a PCN reaction that required hospitalization  No Has patient had a PCN reaction occurring within the last 10 years: Yes  If all of the  above answers are "NO", then may proceed with Cephalosporin use.    ROS Review of Systems  Constitutional: Positive for appetite change and fatigue. Negative for chills and fever.  Respiratory: Negative for cough and shortness of breath.   Cardiovascular: Positive for leg swelling. Negative for chest pain.  Gastrointestinal: Positive for abdominal distention, abdominal pain and constipation. Negative for blood in stool, nausea and vomiting.  Genitourinary: Negative for dysuria.  Psychiatric/Behavioral: Negative for confusion.      Objective:    Physical Exam Vitals reviewed.  Cardiovascular:     Rate and Rhythm: Normal rate and regular rhythm.  Pulmonary:     Effort: Pulmonary effort is normal.     Breath sounds: Normal breath sounds.  Abdominal:     Comments: Abdomen is somewhat distended but positive bowel sounds.  Soft with no localized tenderness.  He has some scar tissue from previous abdominal surgery from hernia repair  Genitourinary:    Testes:        Right: Swelling not present.        Left: Swelling not present.  Musculoskeletal:     Right lower leg: Edema present.     Left lower leg: Edema present.     Comments: He has 1+ pitting edema legs bilaterally  No spinal tenderness.  Does have some paracervical muscle tenderness bilaterally.  No trapezius tenderness.  Neurological:     Mental Status: He is alert.     BP (!) 130/66 (BP Location: Right Arm, Patient Position: Sitting, Cuff Size: Normal)   Pulse 82   Temp 98 F (36.7 C) (Oral)  Wt Readings from Last 3 Encounters:  01/13/20 180 lb (81.6 kg)  10/17/19 180 lb (81.6 kg)  01/12/18 170 lb (77.1 kg)     Health Maintenance Due  Topic Date Due  . Hepatitis C Screening  Never done  . OPHTHALMOLOGY EXAM  02/23/2019  . HEMOGLOBIN A1C  04/15/2019  . PNA vac Low Risk Adult (1 of 2 - PCV13) 09/25/2019  . TETANUS/TDAP  11/13/2019    There are no preventive care reminders to display for this patient.  Lab  Results  Component Value Date   TSH 7.57 (H) 01/25/2020   Lab Results  Component Value Date   WBC 10.4 01/25/2020   HGB 10.2 (L) 01/25/2020   HCT 30.2 (L) 01/25/2020   MCV 87.3 01/25/2020   PLT 269.0 01/25/2020   Lab Results  Component Value Date   NA 138 01/25/2020   K 5.0 01/25/2020   CHLORIDE 106 03/13/2017   CO2 24 01/25/2020   GLUCOSE 92 01/25/2020   BUN 26 (H) 01/25/2020   CREATININE 1.27 01/25/2020   BILITOT 0.3 01/15/2020   ALKPHOS 52 01/15/2020   AST 13 (L) 01/15/2020   ALT 15 01/15/2020   PROT 6.5 01/15/2020   ALBUMIN 3.0 (L) 01/15/2020   CALCIUM 8.8 01/25/2020   ANIONGAP 8 01/16/2020   EGFR 56 (L) 03/13/2017   GFR 56.86 (L) 01/25/2020   Lab Results  Component Value Date   CHOL 193 04/14/2018   Lab Results  Component Value Date   HDL 31.50 (L) 04/14/2018   Lab Results  Component Value Date   LDLCALC 72 12/22/2016   Lab Results  Component Value Date   TRIG 313.0 (H) 04/14/2018   Lab Results  Component Value Date   CHOLHDL 6 04/14/2018   Lab  Results  Component Value Date   HGBA1C 5.9 (A) 10/13/2018      Assessment & Plan:   #1 bilateral neck pain.  Suspect more muscular.  Pain is difficult to evaluate because of his metastatic renal cell cancer.  He is not describing radiculitis features.  -Continue conservative topical therapy such as heat and sports creams -We discussed trial of baclofen 10 mg 3 times daily as needed.  Reviewed potential side effects  #2 progressive bilateral lower extremity edema.  Probably exacerbated by his amlodipine and inactivity  -Try reducing amlodipine to 5 mg daily and monitor blood pressure closely -Start back furosemide 20 mg daily for 3 to 4 days and then try regimen of 1 every Monday, Wednesday, and Friday until follow-up  #3 hypothyroidism recently under replaced.  Recent adjustment in thyroid medication  -Recheck TSH at follow-up in 2 months  #4 abdominal pain/bloating.  He has had some decreased  stool frequency but no signs of obstruction.  No nausea or vomiting.  Again, pain is difficult to assess in him because of his metastatic renal cell carcinoma. ? Secondary to constipation and incomplete evacuation.   -Continue MiraLAX as needed -We discussed getting plain films of abdomen to see if he has any significant stool retention or other abnormalities as a starting point -Follow-up immediately for any nausea/vomiting or worsening pain or any new symptoms such as fever  Meds ordered this encounter  Medications  . baclofen (LIORESAL) 10 MG tablet    Sig: Take 1 tablet (10 mg total) by mouth 3 (three) times daily.    Dispense:  30 each    Refill:  2    Follow-up: Return in about 2 months (around 05/07/2020).    Carolann Littler, MD

## 2020-03-06 NOTE — Patient Instructions (Signed)
Reduce the Amlodipine to one half tablet or 5 mg daily  Start back the Lasix 20 mg daily for 3-4 days and then back down to one every Monday, Wednesday, and Friday  Monitor blood pressure closely.

## 2020-03-13 ENCOUNTER — Other Ambulatory Visit: Payer: Self-pay

## 2020-03-13 ENCOUNTER — Ambulatory Visit (INDEPENDENT_AMBULATORY_CARE_PROVIDER_SITE_OTHER): Payer: PPO | Admitting: Family Medicine

## 2020-03-13 ENCOUNTER — Encounter: Payer: Self-pay | Admitting: Family Medicine

## 2020-03-13 VITALS — BP 138/70 | HR 76 | Temp 97.8°F

## 2020-03-13 DIAGNOSIS — R109 Unspecified abdominal pain: Secondary | ICD-10-CM

## 2020-03-13 DIAGNOSIS — C649 Malignant neoplasm of unspecified kidney, except renal pelvis: Secondary | ICD-10-CM

## 2020-03-13 MED ORDER — FENTANYL 12 MCG/HR TD PT72: 1.0000 | MEDICATED_PATCH | TRANSDERMAL | 0 refills | Status: DC

## 2020-03-13 NOTE — Progress Notes (Signed)
Established Patient Office Visit  Subjective:  Patient ID: Austin [name], male    DOB: 1956-06-12  Age: 64 y.o. MRN: [id number]  CC:  Chief Complaint  Patient presents with  . Flank Pain    continued pain and hurts to breathe    HPI Austin [name] presents for progressive right-sided pain.  Wife had noted that he has decreased frequency of stools in his colostomy bag.  We obtained recent 1 view abdomen which did not show any evidence for fecal impaction or retained stool.  No nausea or vomiting.  His abdominal pain is predominantly right-sided and he seems to be more swollen on that side.  He has metastatic renal cell with known right abdominal mass related to his cancer along with multiple abdominal nodules.  He states his pain is currently 8 out of 10 frequently and becoming more bothersome.  Starting awake up more at night with pain.  He has tried Tylenol and tramadol without any relief.  Last imaging by CT abdomen and pelvis and chest was January 13, 2020.  There was mention of interval increase in size of bilateral metastatic pulmonary nodules as well as mild increase in size of mass within the right hemiabdomen, increased size of nodule within the left nephrectomy bed, and increased metastatic nodularity within the abdomen and pelvis.  We had consulted palliative care at 1 point but they felt like they did not have much to offer until family decided to go on Hospice.  They have not decided to pursue hospice at this point.  Past Medical History:  Diagnosis Date  . 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 C. difficile colitis 02/08/2017  . Cerebrovascular accident ([place]) 1994   hx of brainstem stroke, residual diminished lung capacity  . Chronic kidney disease    Left Renal Mass  . 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)   . Diverticulitis   . Duodenal ulcer   . GIB (gastrointestinal bleeding) 05/28/2017  . Hx of colonic polyps   . Hypertension     has been off BP meds since GI bleed was thought to have been caused by previously prescribed lisinopril   . Hypogonadism   . Intra-abdominal abscess ([place]) 01/23/2017  . Iron deficiency anemia   . 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 Open abdominal incision with drainage 01/26/2017  . Pituitary adenoma ([place])   . 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 Pneumonia   . PONV (postoperative nausea and vomiting)    vomited after colostomy placement   . Pre-diabetes    last a1c 5.6   . 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[place])   . Urinary incontinence   . Venous stasis    edema    Past Surgical History:  Procedure Laterality Date  . CHOLECYSTECTOMY    . COLON RESECTION N/A 05/04/2017   Procedure: LAPAROSCOPIC SIGMOID COLON RESECTION WITH END COLSOTOMY ERAS PATHWAY;  Surgeon: Alphonsa Overall, MD;  Location: [facility];  Service: General;  Laterality: N/A;  . COLONOSCOPY  02/27/2012   Procedure: COLONOSCOPY;  Surgeon: Jerene Bears, MD;  Location: [facility];  Service: Gastroenterology;  Laterality: N/A;  . COLONOSCOPY N/A 05/31/2017   Procedure: COLONOSCOPY;  Surgeon: Milus Banister, MD;  Location: [facility];  Service: Endoscopy;  Laterality: N/A;  . COLOSTOMY TAKEDOWN N/A 08/20/2017   Procedure:  LAPAROSCOPIC ASSISTED REVISION END COLOSTOMY AND ENTEROLYSIS OF ADHESIONS;  Surgeon: Alphonsa Overall, MD;  Location: [facility];  Service: General;  Laterality: N/A;  ERAS PATHWAY  . ESOPHAGOGASTRODUODENOSCOPY N/A 05/31/2017   Procedure: ESOPHAGOGASTRODUODENOSCOPY (EGD);  Surgeon: Milus Banister, MD;  Location: [facility];  Service: Endoscopy;  Laterality: N/A;  . gamma knife radiation surgery  05/2010   for pituitary  . IR RADIOLOGIST EVAL & MGMT  02/12/2017  . IR RADIOLOGIST EVAL & MGMT  03/17/2017  . IR RADIOLOGIST EVAL & MGMT  04/14/2017  . IR RADIOLOGIST EVAL & MGMT  02/05/2017  . PITUITARY SURGERY   2007   nose approach  . ROBOT ASSISTED LAPAROSCOPIC NEPHRECTOMY  04/23/2012   Procedure: ROBOTIC ASSISTED LAPAROSCOPIC NEPHRECTOMY;  Surgeon: Alexis Frock, MD;  Location: [facility];  Service: Urology;  Laterality: Left;  radical  . stomach peg  02/1993   removed 5-6 years later  . TRACHEOSTOMY TUBE PLACEMENT  02/1993   removed 04/1993  . UMBILICAL HERNIA REPAIR  04/23/2012   Procedure: HERNIA REPAIR UMBILICAL ADULT;  Surgeon: Alexis Frock, MD;  Location: [facility];  Service: Urology;;  . [name]  . vocal cord surgery     injected with collagen, then fat from stomach to improve speech s/p stroke    Family History  Problem Relation Age of Onset  . Alcohol abuse Father   . Throat cancer Father   . Esophageal cancer Father 7  . Arthritis Mother   . Hyperlipidemia Mother   . Uterine cancer Mother 14  . Colon cancer Brother   . Arthritis Maternal Grandmother   . Hypertension Brother   . Stroke Paternal Grandmother     Social History   Socioeconomic History  . Marital status: Married    Spouse name: Not on file  . Number of children: 3  . Years of education: Not on file  . Highest education level: Not on file  Occupational History  . Occupation: disabled    Fish farm manager: UNEMPLOYED    Employer: [employer]    Comment: Retired  . Occupation: Psychiatrist    Comment: retired  Tobacco Use  . Smoking status: Former Smoker    Packs/day: 0.50    Years: 23.00    Pack years: 11.50    Types: Cigarettes    Quit date: 09/24/1992    Years since quitting: 27.4  . Smokeless tobacco: Never Used  Vaping Use  . Vaping Use: Never used  Substance and Sexual Activity  . Alcohol use: No    Comment: rare  . Drug use: No  . Sexual activity: Not on file  Other Topics Concern  . Not on file  Social History Narrative   Retired, disabled 1994 Quadreplegic      10/13/2018:   Originally from [place].   Lives with wife on one level apartment. Wife Peter Congo is main  caregiver.   Has 2 daughters, 1 son, 6 grandchildren.   Loves spending time with grandchildren, some of whom are local and visit frequently. Some family still in [place] with whom he face-times.   Attends church, active with church community with Peter Congo   Enjoys working on the computer, going outside in his motorized wheelchair   Social Determinants of Radio broadcast assistant Strain:   . Difficulty of Paying Living Expenses:   Food Insecurity:   . Worried About Charity fundraiser in the Last Year:   . [place] in the Last Year:  Transportation Needs:   . Film/video editor (Medical):   Marland Kitchen Lack of Transportation (Non-Medical):   Physical Activity:   . Days of Exercise per Week:   . Minutes of Exercise per Session:   Stress:   . Feeling of Stress :   Social Connections:   . Frequency of Communication with Friends and Family:   . Frequency of Social Gatherings with Friends and Family:   . Attends Religious Services:   . Active Member of Clubs or Organizations:   . Attends Archivist Meetings:   Marland Kitchen Marital Status:   Intimate Partner Violence:   . Fear of Current or Ex-Partner:   . Emotionally Abused:   Marland Kitchen Physically Abused:   . Sexually Abused:     Outpatient Medications Prior to Visit  Medication Sig Dispense Refill  . acetaminophen (TYLENOL) 500 MG tablet Take 1 tablet (500 mg total) by mouth every 6 (six) hours as needed for mild pain, moderate pain, fever or headache. 30 tablet 0  . amLODipine (NORVASC) 10 MG tablet Take 1 tablet (10 mg total) by mouth daily. 90 tablet 3  . baclofen (LIORESAL) 10 MG tablet Take 1 tablet (10 mg total) by mouth 3 (three) times daily. 30 each 2  . BD DISP NEEDLES 18G X 1-1/2" MISC USE TO INJECT TESTOSTERONE INTO THE MUSCLE AS DIRECTED EVERY 14 DAYS 6 each 0  . escitalopram (LEXAPRO) 10 MG tablet TAKE 1 TABLET BY MOUTH IN THE MORNING (Patient taking differently: Take 10 mg by mouth daily. ) 90 tablet 0  . ketoconazole  (NIZORAL) 2 % cream APPLY AS NEEDED TO RASH (Patient taking differently: Apply 1 application topically daily as needed for irritation. ) 60 g 0  . levothyroxine (SYNTHROID) 50 MCG tablet Take 1 tablet (50 mcg total) by mouth daily. 90 tablet 3  . Multiple Vitamin (MULTIVITAMIN WITH MINERALS) TABS tablet Take 1 tablet by mouth daily with breakfast.     . SAFETY-LOK 3CC SYR 22GX1.5" 22G X 1-1/2" 3 ML MISC USE TO INJECT 1 ML OF TESTOSTERONE EVERY 14 DAYS 6 each 0  . testosterone enanthate (DELATESTRYL) 200 MG/ML injection INJECT 1 ML INTRAMUSCULARLY  EVERY TWO WEEKS 10 mL 0  . traZODone (DESYREL) 100 MG tablet TAKE 1 TABLET BY MOUTH AT BEDTIME (Patient taking differently: Take 100 mg by mouth at bedtime. ) 90 tablet 0  . triamcinolone cream (KENALOG) 0.1 % Apply 1 application topically daily as needed (for rash).    . traMADol (ULTRAM) 50 MG tablet TAKE 1 TABLET BY MOUTH EVERY 6 HOURS AS NEEDED 60 tablet 0   No facility-administered medications prior to visit.    Allergies  Allergen Reactions  . Ace Inhibitors Other (See Comments)    Solitary kidney, experienced severe hyperkalemia on lisinopril (despite combined with HCTZ, no additional KCl)  . Angiotensin Receptor Blockers Other (See Comments)    See ACEi  . Penicillins Rash and Other (See Comments)    Has patient had a PCN reaction causing immediate rash, facial/tongue/throat swelling, SOB or lightheadedness with hypotension: YES Has patient had a PCN reaction causing severe rash involving mucus membranes or skin necrosis: No Has patient had a PCN reaction that required hospitalization No Has patient had a PCN reaction occurring within the last 10 years: Yes  If all of the above answers are "NO", then may proceed with Cephalosporin use.    ROS Review of Systems  Constitutional: Negative for chills and fever.  Respiratory: Negative for cough and shortness of  breath.   Cardiovascular: Negative for chest pain.  Gastrointestinal: Positive  for abdominal pain.  Genitourinary: Negative for flank pain.      Objective:    Physical Exam Vitals reviewed.  Constitutional:      Appearance: Normal appearance.  Cardiovascular:     Rate and Rhythm: Normal rate and regular rhythm.  Pulmonary:     Effort: Pulmonary effort is normal.     Breath sounds: Normal breath sounds.  Abdominal:     General: Bowel sounds are normal.     Palpations: Abdomen is soft.     Comments: Abdomen slightly distended but good bowel sounds  Mild tenderness mid right quadrant.   Neurological:     Mental Status: He is alert.     BP 138/70 (BP Location: Left Arm, Patient Position: Sitting, Cuff Size: Normal)   Pulse 76   Temp 97.8 F (36.6 C) (Oral)   SpO2 99%  Wt Readings from Last 3 Encounters:  01/13/20 180 lb (81.6 kg)  10/17/19 180 lb (81.6 kg)  01/12/18 170 lb (77.1 kg)     Health Maintenance Due  Topic Date Due  . Hepatitis C Screening  Never done  . OPHTHALMOLOGY EXAM  02/23/2019  . HEMOGLOBIN A1C  04/15/2019  . PNA vac Low Risk Adult (1 of 2 - PCV13) 09/25/2019  . TETANUS/TDAP  11/13/2019  . INFLUENZA VACCINE  03/11/2020    There are no preventive care reminders to display for this patient.  Lab Results  Component Value Date   TSH 7.57 (H) 01/25/2020   Lab Results  Component Value Date   WBC 10.4 01/25/2020   HGB 10.2 (L) 01/25/2020   HCT 30.2 (L) 01/25/2020   MCV 87.3 01/25/2020   PLT 269.0 01/25/2020   Lab Results  Component Value Date   NA 138 01/25/2020   K 5.0 01/25/2020   CHLORIDE 106 03/13/2017   CO2 24 01/25/2020   GLUCOSE 92 01/25/2020   BUN 26 (H) 01/25/2020   CREATININE 1.27 01/25/2020   BILITOT 0.3 01/15/2020   ALKPHOS 52 01/15/2020   AST 13 (L) 01/15/2020   ALT 15 01/15/2020   PROT 6.5 01/15/2020   ALBUMIN 3.0 (L) 01/15/2020   CALCIUM 8.8 01/25/2020   ANIONGAP 8 01/16/2020   EGFR 56 (L) 03/13/2017   GFR 56.86 (L) 01/25/2020   Lab Results  Component Value Date   CHOL 193 04/14/2018    Lab Results  Component Value Date   HDL 31.50 (L) 04/14/2018   Lab Results  Component Value Date   LDLCALC 72 12/22/2016   Lab Results  Component Value Date   TRIG 313.0 (H) 04/14/2018   Lab Results  Component Value Date   CHOLHDL 6 04/14/2018   Lab Results  Component Value Date   HGBA1C 5.9 (A) 10/13/2018      Assessment & Plan:   #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  He has had progressive particularly right upper sided abdominal pain with known right hemiabdominal mass related to his cancer.  His pain has been very poorly controlled with tramadol and Tylenol.  -We had long discussion regarding pain management options.  We discussed possible use of low dose fentanyl patch.  They like the option of looking at topical versus oral.  We reviewed potential side effects -We discussed starting low-dose fentanyl patch 12 mcg/hr patch every 72 hours -We have reviewed potential side effects including constipation -We recommended follow-up in 3 to 4 weeks to reassess.  They will be  in touch sooner if they are not seeing adequate pain relief after starting the patch.  They know not to cut or tear the patch in any way  Meds ordered this encounter  Medications  . fentaNYL (DURAGESIC) 12 MCG/HR    Sig: Place 1 patch onto the skin every 3 (three) days.    Dispense:  10 patch    Refill:  0    Follow-up: Return in about 1 month (around 04/13/2020).    Carolann Littler, MD

## 2020-03-13 NOTE — Patient Instructions (Signed)
Opioid Pain Medicine Management Opioids are powerful medicines that are used to treat moderate to severe pain. When used for short periods of time, they can help you:  Sleep better.  Do better in physical or occupational therapy.  Feel better in the first few days after an injury.  Recover from surgery. Opioids should be taken with the supervision of a trained health care provider. They should be taken for the shortest period of time possible. This is because opioids can be addictive, and the longer you take opioids, the greater your risk of addiction (opioid use disorder). What are the risks? Using opioid pain medicines for longer than 3 days increases your risk of these side effects. Opioids can cause side effects, such as:  Constipation.  Nausea.  Vomiting.  Drowsiness.  Confusion.  Opioid use disorder.  Breathing difficulties (respiratory depression). Taking opioid pain medicine for a long period of time can affect your ability to do daily tasks. It also puts you at risk for:  Motor vehicle accidents.  Depression.  Suicide.  Heart attack.  Overdose, which can sometimes lead to death. What is a pain treatment plan? A pain treatment plan is an agreement between you and your health care provider. Pain is unique to each person, and treatments vary depending on your condition. To manage your pain successfully, you and your health care provider need to understand each other and work together. To help you do this:  Discuss the goals of your treatment, including how much pain you might expect to have and how you will manage the pain.  Review the risks and benefits of taking opioid medicines for your condition.  Remember that a good treatment plan uses more than one approach and minimizes the chance of side effects.  Be honest about the amount of medicines you take and about any drug or alcohol use.  Get pain medicine prescriptions from only one health care provider. Pain  can be managed with many types of alternative treatments. Ask your health care provider to refer you to one or more specialists who can help you manage pain through:  Physical or occupational therapy.  Counseling (cognitive behavioral therapy).  Good nutrition.  Biofeedback.  Massage.  Meditation.  Non-opioid medicine.  Following a gentle exercise program. Tapering your use of opioids If you have been taking opioid medicine for more than a few weeks, you may need to slowly decrease (taper) how much you take until you stop completely. Tapering your use of opioids can decrease your chances of experiencing withdrawal symptoms, such as:  Pain and cramping in the abdomen.  Nausea.  Sweating.  Sleepiness.  Restlessness.  Uncontrollable shaking (tremors).  Cravings for the medicine. Do not attempt to taper your use of opioids on your own. Talk with your health care provider about how to do this. Your health care provider may prescribe a step-down schedule based on how much medicine you are taking and how long you have been taking it. Follow these instructions at home: Safety and storage   While you are taking opioid pain medicine: ? Do not drive. ? Do not use machinery or power tools. ? Do not sign legal documents. ? Do not drink alcohol. ? Do not take sleeping pills. ? Do not supervise children by yourself. ? Do not participate in activities that require climbing or being in high places. ? Do not enter a body of water, such as a lake, river, ocean, spa, or swimming pool.  Keep pain medicine in a  locked cabinet or in a secure area where children cannot reach it.  Do not share your pain medicine with anyone. Getting rid of leftover pills  Do not save any leftover pills. Get rid of leftover pills safely by: ? Taking the medicine to a prescription take-back program. This is usually offered by the county or law enforcement. ? Bringing it to a pharmacy that has a drug  disposal container. ? Throwing it out in the trash. Check the label or package insert of your medicine to see whether this is safe to do. If it is safe to throw it out, remove the medicine from the container and mix it with food or pet waste before putting it in the trash. Activity  Return to your normal activities as told by your health care provider. Ask your health care provider what activities are safe for you.  Avoid activities that make your pain worse.  Do exercises as told by your health care provider. General instructions  You may need to take these actions to prevent or treat constipation: ? Drink enough fluid to keep your urine pale yellow. ? Take over-the-counter or prescription medicines. ? Eat foods that are high in fiber, such as beans, whole grains, and fresh fruits and vegetables. ? Limit foods that are high in fat and processed sugars, such as fried or sweet foods.  Keep all follow-up visits as told by your health care provider. This is important. Where to find support If you have been taking opioids for a long time, you may benefit from receiving support for quitting from a local support group or counselor. Ask your health care provider for a referral to these resources in your area. Where to find more information Centers for Disease Control and Prevention (CDC): http://www.wolf.info/ Get help right away if: Seek medical care right away if you are taking opioids and you, or people close to you, notice any of the following:  Difficulty breathing.  Breathing that is slower or more shallow than normal.  A very slow heartbeat (pulse).  Severe confusion.  Unconsciousness.  Sleepiness.  Slurred speech.  Nausea and vomiting.  Cold, clammy skin.  Blue lips or fingernails.  Limpness.  Abnormally small pupils. If you think that you or someone else may have taken too much of an opioid medicine, get medical help right away. Call your local emergency services (911 in the  [place] Do not drive yourself to the hospital. If you ever feel like you may hurt yourself or others, or have thoughts about taking your own life, get help right away. You can go to your nearest emergency department or call:  Your local emergency services (911 in the [place]  The hotline of the [facility] 204-816-4491 in the [place]  A suicide crisis helpline, such as the [facility] at 412-700-6917. This is open 24 hours a day. Summary  Opioid medicines can help you manage moderate to severe pain for a short period of time.  A pain treatment plan is an agreement between you and your health care provider. Discuss the goals of your treatment, including how much pain you might expect to have and how you will manage the pain.  Pain can be managed with many types of alternative treatments.  If you think that you or someone else may have taken too much of an opioid, get medical help right away. This information is not intended to replace advice given to you by your health care provider. Make sure  you discuss any questions you have with your health care provider. Document Revised: 08/27/2018 Document Reviewed: 08/27/2018 Elsevier Patient Education  [place]. Fentanyl skin patch What is this medicine? FENTANYL (FEN ta nil) is a pain reliever. It is used to treat persistent, moderate to severe chronic pain. It is used only by people who have been taking an opioid or narcotic pain medicine for more than one week. This medicine may be used for other purposes; ask your health care provider or pharmacist if you have questions. COMMON BRAND NAME(S): Duragesic What should I tell my health care provider before I take this medicine? They need to know if you have any of these conditions:  brain tumor  Crohn's disease, inflammatory bowel disease, or ulcerative colitis  drug abuse or addiction  head injury  heart disease  if you frequently  drink alcohol containing drinks  kidney disease or problems going to the bathroom  liver disease  lung disease, asthma, or breathing problems  mental problems  skin problems  taken isocarboxazid, phenelzine, tranylcypromine, or selegiline in the past 2 weeks  an allergic or unusual reaction to fentanyl, meperidine, other medicines, foods, dyes, or preservatives  pregnant or trying to get pregnant  breast-feeding How should I use this medicine? Apply the patch to your skin. Do not cut or damage the patch. A cut or damaged patch can be very dangerous because you may get too much medicine. Select a clean, dry area of skin above your waist on your front or back. The upper back is a good spot to put the patch on children or people who are confused because it will be hard for them to remove the patch. Do not apply the patch to oily, broken, burned, cut, or irritated skin. Use only water to clean the area. Do not use soap or alcohol to clean the skin because this can increase the effects of the medicine. If the area is hairy, clip the hair with scissors, but do not shave. Take the patch out of its wrapper, and take off the protective strip over the sticky part. Do not use a patch if the packaging or backing is damaged. Do not touch the sticky part with your fingers. Press the sticky surface to the skin using the palm of your hand. Press the patch to the skin for 30 seconds. Wash your hands at once with soap and water. Keep patches far away from children. Do not let children see you apply the patch and do not apply it where children can see it. Do not call the patch a sticker, tattoo, or bandage, as this could encourage the child to mimic your actions. Used patches still contain medicine. Children or pets can have serious side effects or die from putting used patches in their mouth or on their bodies. Take off the old patch before putting on a new patch. Apply each new patch to a different area of  skin. If a patch comes off or causes irritation, remove it and apply a new patch to different site. If the edges of the patch start to loosen, first apply first aid tape to the edges of the patch. If problems with the patch not sticking continue, cover the patch with a see-through adhesive dressing (like Bioclusive or Tegaderm). Never cover the patch with any other bandage or tape. To get rid of used patches, fold the patch in half with the sticky sides together. Then, flush it down the toilet. Do not discard the patch  in the garbage. Pets and children can be harmed if they find used or lost patches. Replace the patch every 3 days or as directed by your doctor or health care professional. Follow the directions on the prescription label. Do not take more medicine than you are told to take. A special MedGuide will be given to you by the pharmacist with each prescription and refill. Be sure to read this information carefully each time. Talk to your pediatrician regarding the use of this medicine in children. While this drug may be prescribed for children as young as 71 years old for selected conditions, precautions do apply. If someone accidentally uses a fentanyl patch and is not awake and alert, immediately call 911 for help. If the person is awake and alert, call a doctor, health care professional, or the [employer]. Overdosage: If you think you have taken too much of this medicine contact a poison control center or emergency room at once. NOTE: This medicine is only for you. Do not share this medicine with others. What if I miss a dose? If you forget to replace your patch, take off the old patch and put on a new patch as soon as you can. Do not apply an extra patch to your skin. Do not wear more than one patch at the same time unless told to do so by your doctor or health care professional. What may interact with this medicine? Do not take this medication with any of the following  medicines:  mifepristone This medicine may also interact with the following medications:  alcohol  antihistamines for allergy, cough and cold  antiviral medicines for HIV or AIDS  aprepitant  atropine  certain antibiotics like erythromycin and clarithromycin  certain medicines for anxiety or sleep  certain medicines for bladder problems like oxybutynin, tolterodine  certain medicines for blood pressure, heart disease, irregular heart beat  certain medicines for depression like amitriptyline, fluoxetine, sertraline  certain medicines for diabetes like pioglitazone, troglitazone  certain medicines for fungal infections like ketoconazole and itraconazole  certain medicines for seizures like phenobarbital, phenytoin, primidone  certain medicines for stomach problems like dicyclomine, hyoscyamine  certain medicines for travel sickness like scopolamine  certain medicines for Parkinson's disease like benztropine, trihexyphenidyl  cimetidine  general anesthetics like halothane, isoflurane, methoxyflurane, propofol  grapefruit juice  ipratropium  local anesthetics like lidocaine, pramoxine, tetracaine  MAOIs like Carbex, Eldepryl, Marplan, Nardil, and Parnate  medicines that relax muscles for surgery  other narcotic medicines for pain or cough  phenothiazines like chlorpromazine, mesoridazine, prochlorperazine, thioridazine  rifampin  St. John's wort  steroid medicines like prednisone or cortisone This list may not describe all possible interactions. Give your health care provider a list of all the medicines, herbs, non-prescription drugs, or dietary supplements you use. Also tell them if you smoke, drink alcohol, or use illegal drugs. Some items may interact with your medicine. What should I watch for while using this medicine? Tell your health care provider if your pain does not go away, if it gets worse, or if you have new or a different type of pain. You may  develop tolerance to this drug. Tolerance means that you will need a higher dose of the drug for pain relief. Tolerance is normal and is expected if you take this drug for a long time. Do not suddenly stop taking your drug because you may develop a severe reaction. Your body becomes used to the drug. This does NOT mean you are addicted. Addiction  is a behavior related to getting and using a drug for a nonmedical reason. If you have pain, you have a medical reason to take pain drug. Your health care provider will tell you how much drug to take. If your health care provider wants you to stop the drug, the dose will be slowly lowered over time to avoid any side effects. If you take other drugs that also cause drowsiness like other narcotic pain drugs, benzodiazepines, or other drugs for sleep, you may have more side effects. Give your health care provider a list of all drugs you use. He or she will tell you how much drug to take. Do not take more drug than directed. Get emergency help right away if you have trouble breathing, unusually tired or sleepy, or not being able to respond or wake up. Talk to your health care provider about naloxone and how to get it. Naloxone is an emergency drug used for an opioid overdose. An overdose can happen if you take too much opioid. It can also happen if an opioid is taken with some other drugs or substances, like alcohol. Know the symptoms of an overdose, like trouble breathing, unusually tired or sleepy, or not being able to respond or wake up. Make sure to tell caregivers and close contacts where it is stored. Make sure they know how to use it. After naloxone is given, you must get emergency help right away. Naloxone is a temporary treatment. Repeat doses may be needed. This patch is sensitive to body heat changes. If your skin gets too hot, more drug will come out of the patch and can cause a deadly overdose. Call your health care provider if you get a fever. Do not take hot  baths. Do not sunbathe. Do not use hot tubs, saunas, hairdryers, heating pads, electric blankets, heated waterbeds, or tanning lamps. Do not do exercise that increases your body temperature. If you are going to need surgery, an MRI, CT scan, or other procedure, tell your health care provider that you are using this drug. You may need to remove the patch before the procedure. You may get drowsy or dizzy. Do not drive, use machinery, or do anything that needs mental alertness until you know how this drug affects you. Do not stand up or sit up quickly, especially if you are an older patient. This reduces the risk of dizzy or fainting spells. Alcohol may interfere with the effect of this drug. Avoid alcoholic drinks. This drug will cause constipation. If you do not have a bowel movement for 3 days, call your health care provider. Your mouth may get dry. Chewing sugarless gum or sucking hard candy and drinking plenty of water may help. Contact your health care provider if the problem does not go away or is severe. What side effects may I notice from receiving this medicine? Side effects that you should report to your doctor or health care professional as soon as possible:  allergic reactions like skin rash, itching or hives, swelling of the face, lips, or tongue  breathing problems  confusion  signs and symptoms of low blood pressure like dizziness; feeling faint or lightheaded, falls; unusually weak or tired  trouble passing urine or change in the amount of urine Side effects that usually do not require medical attention (report to your doctor or health care professional if they continue or are bothersome):  constipation  dry mouth  itching at the site where the patch was applied  nausea, vomiting  tiredness  This list may not describe all possible side effects. Call your doctor for medical advice about side effects. You may report side effects to FDA at 1-800-FDA-1088. Where should I keep my  medicine? Keep out of the reach of children. This medicine can be abused. Keep your medicine in a safe place to protect it from theft. Do not share this medicine with anyone. Selling or giving away this medicine is dangerous and against the law. Store below 77 degrees F (25 degrees C). Do not store the patches out of their wrappers. This medicine may cause harm and death if it is taken by other adults, children, or pets. Return medicine that has not been used to an official disposal site. Contact the DEA at 8173729421 or your city/county government to find a site. If you cannot return the medicine, flush it down the toilet as instructed above. Do not use the medicine after the expiration date. NOTE: This sheet is a summary. It may not cover all possible information. If you have questions about this medicine, talk to your doctor, pharmacist, or health care provider.  2020 Elsevier/Gold Standard (2019-03-07 11:37:19)

## 2020-03-21 ENCOUNTER — Encounter: Payer: Self-pay | Admitting: Gastroenterology

## 2020-03-21 ENCOUNTER — Ambulatory Visit (INDEPENDENT_AMBULATORY_CARE_PROVIDER_SITE_OTHER): Payer: PPO | Admitting: Gastroenterology

## 2020-03-21 ENCOUNTER — Telehealth: Payer: Self-pay | Admitting: Internal Medicine

## 2020-03-21 ENCOUNTER — Other Ambulatory Visit (INDEPENDENT_AMBULATORY_CARE_PROVIDER_SITE_OTHER): Payer: PPO

## 2020-03-21 VITALS — BP 126/80 | HR 74 | Ht 72.0 in | Wt 180.0 lb

## 2020-03-21 DIAGNOSIS — K5909 Other constipation: Secondary | ICD-10-CM | POA: Diagnosis not present

## 2020-03-21 DIAGNOSIS — C649 Malignant neoplasm of unspecified kidney, except renal pelvis: Secondary | ICD-10-CM | POA: Diagnosis not present

## 2020-03-21 DIAGNOSIS — R1084 Generalized abdominal pain: Secondary | ICD-10-CM

## 2020-03-21 DIAGNOSIS — R198 Other specified symptoms and signs involving the digestive system and abdomen: Secondary | ICD-10-CM | POA: Diagnosis not present

## 2020-03-21 LAB — BASIC METABOLIC PANEL
BUN: 29 mg/dL — ABNORMAL HIGH (ref 6–23)
CO2: 21 mEq/L (ref 19–32)
Calcium: 9.3 mg/dL (ref 8.4–10.5)
Chloride: 99 mEq/L (ref 96–112)
Creatinine, Ser: 1.15 mg/dL (ref 0.40–1.50)
GFR: 63.73 mL/min (ref >60.00)
Glucose, Bld: 107 mg/dL — ABNORMAL HIGH (ref 70–99)
Potassium: 5.4 mEq/L — ABNORMAL HIGH (ref 3.5–5.1)
Sodium: 128 mEq/L — ABNORMAL LOW (ref 135–145)

## 2020-03-21 NOTE — Patient Instructions (Signed)
If you are age 65 or older, your body mass index should be between 23-30. Your Body mass index is 24.41 kg/m. If this is out of the aforementioned range listed, please consider follow up with your Primary Care Provider.  If you are age 28 or younger, your body mass index should be between 19-25. Your Body mass index is 24.41 kg/m. If this is out of the aformentioned range listed, please consider follow up with your Primary Care Provider.   Miralax 1 capful daily in 8 ounces in daily.   You have been scheduled for a CT scan of the abdomen and pelvis at [facility], 1st floor Radiology. You are scheduled on Thursday 04/05/20 at 2 pm. You should arrive 15 minutes prior to your appointment time for registration. Please follow the written instructions below on the day of your exam:    1) Do not eat anything after 10 am (4 hours prior to your test)  2) At least 3 days prior to your procedure you will need to pick up) 2 bottles of oral contrast to drink.  The solution may taste better if refrigerated, but do NOT add ice or any other liquid to this solution. Shake well before drinking.   Drink 1 bottle of contrast @ 12 pm (2 hours prior to your exam)  Drink 1 bottle of contrast @ 1 pm (1 hour prior to your exam)   You may take any medications as prescribed with a small amount of water, if necessary. If you take any of the following medications: METFORMIN, GLUCOPHAGE, GLUCOVANCE, AVANDAMET, RIOMET, FORTAMET, [place] MET, JANUMET, GLUMETZA or METAGLIP, you MAY be asked to HOLD this medication 48 hours AFTER the exam.   The purpose of you drinking the oral contrast is to aid in the visualization of your intestinal tract. The contrast solution may cause some diarrhea. Depending on your individual set of symptoms, you may also receive an intravenous injection of x-ray contrast/dye. Plan on being at [facility] for 45 minutes or longer, depending on the type of exam you are having performed.   If you have  any questions regarding your exam or if you need to reschedule, you may call [facility] at [phone] between the hours of 8:00 am and 5:00 pm, Monday-Friday.

## 2020-03-21 NOTE — Telephone Encounter (Signed)
Pt has multiple issues, had a colostomy. They were told he would shed the lining of the intestines about every month and it is usually a white rectal discharge the consistency of desitin. For the past week and a half he had been having a clear discharge from the rectum. Pt is also having some abdominal pain. Requesting to be seen. Pt scheduled to see Alonza Bogus PA today at 2:30pm. Pts wife aware of appt.

## 2020-03-22 ENCOUNTER — Other Ambulatory Visit: Payer: Self-pay | Admitting: *Deleted

## 2020-03-22 ENCOUNTER — Other Ambulatory Visit: Payer: Self-pay | Admitting: Family Medicine

## 2020-03-22 DIAGNOSIS — E871 Hypo-osmolality and hyponatremia: Secondary | ICD-10-CM

## 2020-03-22 DIAGNOSIS — E875 Hyperkalemia: Secondary | ICD-10-CM

## 2020-03-22 MED ORDER — FENTANYL 25 MCG/HR TD PT72: 1.0000 | MEDICATED_PATCH | TRANSDERMAL | 0 refills | Status: DC

## 2020-03-22 NOTE — Progress Notes (Signed)
Patient's pain poorly controlled on Duragesic 12.5 mcg dose.  He is still having severe pain at times and difficulty sleeping at night.  Otherwise, tolerating the patch.  Will increase  Duragesic to 25 mcg patch every 72 hours and sent in #5 to see if this dose works.

## 2020-03-23 ENCOUNTER — Other Ambulatory Visit: Payer: Self-pay

## 2020-03-23 ENCOUNTER — Encounter: Payer: Self-pay | Admitting: Gastroenterology

## 2020-03-23 ENCOUNTER — Other Ambulatory Visit: Payer: PPO

## 2020-03-23 DIAGNOSIS — E871 Hypo-osmolality and hyponatremia: Secondary | ICD-10-CM | POA: Diagnosis not present

## 2020-03-23 DIAGNOSIS — K5909 Other constipation: Secondary | ICD-10-CM | POA: Insufficient documentation

## 2020-03-23 DIAGNOSIS — R198 Other specified symptoms and signs involving the digestive system and abdomen: Secondary | ICD-10-CM | POA: Insufficient documentation

## 2020-03-23 LAB — BASIC METABOLIC PANEL WITH GFR
BUN/Creatinine Ratio: 24 (calc) — ABNORMAL HIGH (ref 6–22)
BUN: 31 mg/dL — ABNORMAL HIGH (ref 7–25)
CO2: 21 mmol/L (ref 20–32)
Calcium: 9.3 mg/dL (ref 8.6–10.3)
Chloride: 100 mmol/L (ref 98–110)
Creat: 1.29 mg/dL — ABNORMAL HIGH (ref 0.70–1.25)
GFR, Est African American: 67 mL/min/{1.73_m2} (ref >=60)
GFR, Est Non African American: 58 mL/min/{1.73_m2} — ABNORMAL LOW (ref >=60)
Glucose, Bld: 87 mg/dL (ref 65–99)
Potassium: 5.7 mmol/L — ABNORMAL HIGH (ref 3.5–5.3)
Sodium: 131 mmol/L — ABNORMAL LOW (ref 135–146)

## 2020-03-23 NOTE — Progress Notes (Signed)
03/23/2020 Austin [name] [id number] 1955/10/13   HISTORY OF PRESENT ILLNESS: This is a 64 year old male who is a patient of Dr. Vena Rua.  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  He also has a history of peptic ulcer disease, metastatic renal cell carcinoma which has been progressive recently, remote CVAs resulting quadriplegia.  He is here today with his wife who is his caregiver.  Their primary complaint and reason for the visit today is for a rectal discharge that they have noticed for the past couple of weeks.  She says that this is something new and completely different than what they have seen in the past.  She describes it as almost a clear fluid not even quite like mucus that occur sometimes in large amounts throughout the day.  She denies seeing any blood.  They do describe some decreased ostomy output recently.  He is not on any type of laxatives or bowel regimen.  He also complains of generalized abdominal pain, but mostly on the right.  He had a CT scan of the abdomen and pelvis in June which showed increased size and has bilateral metastatic pulmonary nodules, increase size of the mass within the right hemiabdomen, and increase in size in the metastatic nodularity within the abdomen and pelvis.   Past Medical History:  Diagnosis Date  . 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 C. difficile colitis 02/08/2017  . Cerebrovascular accident ([place]) 1994   hx of brainstem stroke, residual diminished lung capacity  . Chronic kidney disease    Left Renal Mass  . 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 Diverticulitis   . Duodenal ulcer   . GIB (gastrointestinal bleeding) 05/28/2017  . Hx of colonic polyps   . Hypertension    has been off BP meds since GI bleed was thought to have been caused by  previously prescribed lisinopril   . Hypogonadism   . Intra-abdominal abscess ([place]) 01/23/2017  . Iron deficiency anemia   . 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 Neuromuscular disorder ([place])    quadraplegic  . Open abdominal incision with drainage 01/26/2017  . Pituitary adenoma ([place])   . 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 Pneumonia   . PONV (postoperative nausea and vomiting)    vomited after colostomy placement   . Pre-diabetes    last a1c 5.6   . 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[place])   . Urinary incontinence   . Venous stasis    edema   Past Surgical History:  Procedure Laterality Date  . CHOLECYSTECTOMY    . COLON RESECTION N/A 05/04/2017   Procedure: LAPAROSCOPIC SIGMOID COLON RESECTION WITH END COLSOTOMY ERAS PATHWAY;  Surgeon: Alphonsa Overall, MD;  Location: [facility];  Service: General;  Laterality: N/A;  . COLONOSCOPY  02/27/2012   Procedure: COLONOSCOPY;  Surgeon: Jerene Bears, MD;  Location: [facility];  Service: Gastroenterology;  Laterality: N/A;  . COLONOSCOPY N/A 05/31/2017   Procedure: COLONOSCOPY;  Surgeon: Milus Banister, MD;  Location: [facility];  Service: Endoscopy;  Laterality: N/A;  . COLOSTOMY TAKEDOWN N/A 08/20/2017   Procedure: LAPAROSCOPIC ASSISTED REVISION END COLOSTOMY AND ENTEROLYSIS OF ADHESIONS;  Surgeon: Alphonsa Overall, MD;  Location: [facility];  Service: General;  Laterality:  N/A;  ERAS PATHWAY  . ESOPHAGOGASTRODUODENOSCOPY N/A 05/31/2017   Procedure: ESOPHAGOGASTRODUODENOSCOPY (EGD);  Surgeon: Milus Banister, MD;  Location: [facility];  Service: Endoscopy;  Laterality: N/A;  . gamma knife radiation surgery  05/2010   for pituitary  . IR RADIOLOGIST EVAL & MGMT  02/12/2017  . IR RADIOLOGIST EVAL & MGMT  03/17/2017  . IR RADIOLOGIST EVAL & MGMT  04/14/2017  . IR RADIOLOGIST EVAL & MGMT  02/05/2017  . PITUITARY SURGERY  2007   nose approach  . ROBOT ASSISTED LAPAROSCOPIC NEPHRECTOMY   04/23/2012   Procedure: ROBOTIC ASSISTED LAPAROSCOPIC NEPHRECTOMY;  Surgeon: Alexis Frock, MD;  Location: [facility];  Service: Urology;  Laterality: Left;  radical  . stomach peg  02/1993   removed 5-6 years later  . TRACHEOSTOMY TUBE PLACEMENT  02/1993   removed 04/1993  . UMBILICAL HERNIA REPAIR  04/23/2012   Procedure: HERNIA REPAIR UMBILICAL ADULT;  Surgeon: Alexis Frock, MD;  Location: [facility];  Service: Urology;;  . [name]  . vocal cord surgery     injected with collagen, then fat from stomach to improve speech s/p stroke    reports that he quit smoking about 27 years ago. His smoking use included cigarettes. He has a 11.50 pack-year smoking history. He has never used smokeless tobacco. He reports that he does not drink alcohol and does not use drugs. family history includes Alcohol abuse in his father; Arthritis in his maternal grandmother and mother; Colon cancer in his brother; Esophageal cancer (age of onset: 61) in his father; Hyperlipidemia in his mother; Hypertension in his brother; Stroke in his paternal grandmother; Throat cancer in his father; Uterine cancer (age of onset: 10) in his mother. Allergies  Allergen Reactions  . Ace Inhibitors Other (See Comments)    Solitary kidney, experienced severe hyperkalemia on lisinopril (despite combined with HCTZ, no additional KCl)  . Angiotensin Receptor Blockers Other (See Comments)    See ACEi  . Penicillins Rash and Other (See Comments)    Has patient had a PCN reaction causing immediate rash, facial/tongue/throat swelling, SOB or lightheadedness with hypotension: YES Has patient had a PCN reaction causing severe rash involving mucus membranes or skin necrosis: No Has patient had a PCN reaction that required hospitalization No Has patient had a PCN reaction occurring within the last 10 years: Yes  If all of the above answers are "NO", then may proceed with Cephalosporin use.      Outpatient Encounter Medications as of  03/21/2020  Medication Sig  . acetaminophen (TYLENOL) 500 MG tablet Take 1 tablet (500 mg total) by mouth every 6 (six) hours as needed for mild pain, moderate pain, fever or headache.  Marland Kitchen amLODipine (NORVASC) 10 MG tablet Take 1 tablet (10 mg total) by mouth daily. (Patient taking differently: Take 5 mg by mouth daily. )  . ketoconazole (NIZORAL) 2 % cream APPLY AS NEEDED TO RASH (Patient taking differently: Apply 1 application topically daily as needed for irritation. )  . levothyroxine (SYNTHROID) 50 MCG tablet Take 1 tablet (50 mcg total) by mouth daily.  . Multiple Vitamin (MULTIVITAMIN WITH MINERALS) TABS tablet Take 1 tablet by mouth daily with breakfast.   . testosterone enanthate (DELATESTRYL) 200 MG/ML injection INJECT 1 ML INTRAMUSCULARLY  EVERY TWO WEEKS  . traZODone (DESYREL) 100 MG tablet TAKE 1 TABLET BY MOUTH AT BEDTIME (Patient taking differently: Take 50 mg by mouth at bedtime. )  . triamcinolone cream (KENALOG) 0.1 % Apply 1 application topically daily  as needed (for rash).  . [DISCONTINUED] BD DISP NEEDLES 18G X 1-1/2" MISC USE TO INJECT TESTOSTERONE INTO THE MUSCLE AS DIRECTED EVERY 14 DAYS  . [DISCONTINUED] escitalopram (LEXAPRO) 10 MG tablet TAKE 1 TABLET BY MOUTH IN THE MORNING (Patient taking differently: Take 10 mg by mouth daily. )  . [DISCONTINUED] fentaNYL (DURAGESIC) 12 MCG/HR Place 1 patch onto the skin every 3 (three) days.  . [DISCONTINUED] SAFETY-LOK 3CC SYR 22GX1.5" 22G X 1-1/2" 3 ML MISC USE TO INJECT 1 ML OF TESTOSTERONE EVERY 14 DAYS  . [DISCONTINUED] baclofen (LIORESAL) 10 MG tablet Take 1 tablet (10 mg total) by mouth 3 (three) times daily.   No facility-administered encounter medications on file as of 03/21/2020.     REVIEW OF SYSTEMS  : All other systems reviewed and negative except where noted in the History of Present Illness.   PHYSICAL EXAM: BP 126/80   Pulse 74   Ht 6' (1.829 m)   Wt 180 lb (81.6 kg)   BMI 24.41 kg/m  General: Well developed  white male in no acute distress; in motorized wheelchair Head: Normocephalic and atraumatic Eyes:  Sclerae anicteric, conjunctiva pink. Ears: Normal auditory acuity Lungs: Clear throughout to auscultation; no increased WOB. Heart: Regular rate and rhythm; no M/R/G. Abdomen: Soft, non-distended.  BS present.  Bulging right flank noted with tenderness and palpable kidney.  Ostomy noted on the left with some soft stool in the bag. Musculoskeletal: Symmetrical with no gross deformities  Skin: No lesions on visible extremities Extremities: No edema  Neurological: Alert oriented x 4, grossly non-focal Psychological:  Alert and cooperative. Normal mood and affect  ASSESSMENT AND PLAN: *Rectal discharge: He has an ostomy in place for several years.  His wife reports that this is completely new over the past couple of weeks and is much different than anything that they have seen coming from his rectum in the past.  She describes it as almost a clear fluid not even quite like mucus.  I am not quite sure what this represents.  I am going to reach out to Dr. Hilarie Fredrickson to see what his thoughts are.  May be a question that it could be better addressed by the surgeons. *Generalized abdominal pain: Mostly on the right side of the abdomen.  He had progression of his renal cell carcinoma on his last CT scan in June.  I am afraid that his pain may be a manifestation of further progression.  We will repeat CT scan of the abdomen and pelvis with contrast.  Check BMP today.  If it appears that this is progressed further and there is no other acute issues then likely he may need a progressive pain management regimen with the oncology team. *Constipation having lesser amount of stool in his ostomy bag.  Not really using any type of laxative or anything regularly.  I am quite surprised that he actually has not required something regularly before now with his immobility, etc.  Nonetheless, for now have asked him to begin MiraLAX  1 capful mixed in 8 ounces of liquid daily. 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  CC:  [name], MD

## 2020-03-26 ENCOUNTER — Other Ambulatory Visit: Payer: Self-pay | Admitting: Family Medicine

## 2020-03-26 MED ORDER — TRAZODONE HCL 100 MG PO TABS: 50.0000 mg | ORAL_TABLET | Freq: Every day | ORAL | 3 refills | Status: DC

## 2020-03-29 ENCOUNTER — Ambulatory Visit (INDEPENDENT_AMBULATORY_CARE_PROVIDER_SITE_OTHER): Payer: PPO

## 2020-03-29 ENCOUNTER — Other Ambulatory Visit: Payer: Self-pay

## 2020-03-29 DIAGNOSIS — Z0000 Encounter for general adult medical examination without abnormal findings: Secondary | ICD-10-CM | POA: Diagnosis not present

## 2020-03-29 NOTE — Progress Notes (Addendum)
Subjective:   Austin [name] is a 64 y.o. male who presents for Medicare Annual/Subsequent preventive examination.   I connected with Austin [name] today by telephone and verified that I am speaking with the correct person using two identifiers. Location patient: home Location provider: work Persons participating in the virtual visit: patient, provider.   I discussed the limitations, risks, security and privacy concerns of performing an evaluation and management service by telephone and the availability of in person appointments. I also discussed with the patient that there may be a patient responsible charge related to this service. The patient expressed understanding and verbally consented to this telephonic visit.    Interactive audio and video telecommunications were attempted between this provider and patient, however failed, due to patient having technical difficulties OR patient did not have access to video capability.  We continued and completed visit with audio only.     Review of Systems    N/A Cardiac Risk Factors include: advanced age (>43mn, >>57women);male gender;dyslipidemia     Objective:    Today's Vitals   03/29/20 1114  PainSc: 4    There is no height or weight on file to calculate BMI.  Advanced Directives 03/29/2020 01/19/2020 01/14/2020 01/13/2020 10/18/2019 10/17/2019 10/14/2019  Does Patient Have a Medical Advance Directive? _0  Yes Yes  Type of AParamedicof ACantrallLiving will HNorth Ton[place]andaLiving will Healthcare Power of AVazquezLiving will HWest SalemLiving will HMehlvilleLiving will HPortiaLiving will  Does patient want to make changes to medical advance directive? No - Patient declined No - Patient declined No - Patient declined - No - Patient declined - -  Copy of HRosa Sanchezin Chart? Yes -  validated most recent copy scanned in chart (See row information) - No - copy requested - No - copy requested - Yes - validated most recent copy scanned in chart (See row information)  Would patient like information on creating a medical advance directive? - - - - No - Patient declined - No - Patient declined  Pre-existing out of facility DNR order (yellow form or pink MOST form) - - - - - - -    Current Medications (verified) Outpatient Encounter Medications as of 03/29/2020  Medication Sig  . amLODipine (NORVASC) 10 MG tablet Take 1 tablet (10 mg total) by mouth daily. (Patient taking differently: Take 5 mg by mouth daily. )  . BD DISP NEEDLES 18G X 1-1/2" MISC USE TO INJECT TESTOSTERONE INTO THE MUSCLE AS DIRECTED EVERY 14 DAYS  . escitalopram (LEXAPRO) 10 MG tablet TAKE 1 TABLET BY MOUTH IN THE MORNING  . fentaNYL (DURAGESIC) 25 MCG/HR Place 1 patch onto the skin every 3 (three) days.  .Marland Kitchenketoconazole (NIZORAL) 2 % cream APPLY AS NEEDED TO RASH (Patient taking differently: Apply 1 application topically daily as needed for irritation. )  . levothyroxine (SYNTHROID) 50 MCG tablet Take 1 tablet (50 mcg total) by mouth daily.  . Multiple Vitamin (MULTIVITAMIN WITH MINERALS) TABS tablet Take 1 tablet by mouth daily with breakfast.   . SAFETY-LOK 3CC SYR 22GX1.5" 22G X 1-1/2" 3 ML MISC USE TO INJECT 1 ML OF TESTOSTERONE EVERY 14 DAYS  . testosterone enanthate (DELATESTRYL) 200 MG/ML injection INJECT 1 ML INTRAMUSCULARLY  EVERY TWO WEEKS  . traZODone (DESYREL) 100 MG tablet Take 0.5 tablets (50 mg total) by mouth at bedtime.  . triamcinolone cream (KENALOG)  0.1 % Apply 1 application topically daily as needed (for rash).  Marland Kitchen acetaminophen (TYLENOL) 500 MG tablet Take 1 tablet (500 mg total) by mouth every 6 (six) hours as needed for mild pain, moderate pain, fever or headache. (Patient not taking: Reported on 03/29/2020)   No facility-administered encounter medications on file as of 03/29/2020.     Allergies (verified) Ace inhibitors, Angiotensin receptor blockers, and Penicillins   History: Past Medical History:  Diagnosis Date  . 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 C. difficile colitis 02/08/2017  . Cerebrovascular accident ([place]) 1994   hx of brainstem stroke, residual diminished lung capacity  . Chronic kidney disease    Left Renal Mass  . 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 Diverticulitis   . Duodenal ulcer   . GIB (gastrointestinal bleeding) 05/28/2017  . Hx of colonic polyps   . Hypertension    has been off BP meds since GI bleed was thought to have been caused by previously prescribed lisinopril   . Hypogonadism   . Intra-abdominal abscess ([place]) 01/23/2017  . Iron deficiency anemia   . 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 Open abdominal incision with drainage 01/26/2017  . Pituitary adenoma ([place])   . 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 Pneumonia   . PONV (postoperative nausea and vomiting)    vomited after colostomy placement   . Pre-diabetes    last a1c 5.6   . 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[place])   . Urinary incontinence   . Venous stasis    edema   Past Surgical History:  Procedure Laterality Date  . CHOLECYSTECTOMY    . COLON RESECTION N/A 05/04/2017   Procedure: LAPAROSCOPIC SIGMOID COLON RESECTION WITH END COLSOTOMY ERAS PATHWAY;  Surgeon: Alphonsa Overall, MD;  Location: [facility];  Service: General;  Laterality: N/A;  . COLONOSCOPY  02/27/2012   Procedure: COLONOSCOPY;  Surgeon: Jerene Bears, MD;  Location: [facility];  Service: Gastroenterology;  Laterality: N/A;  . COLONOSCOPY N/A 05/31/2017   Procedure: COLONOSCOPY;  Surgeon: Milus Banister, MD;  Location: [facility];  Service: Endoscopy;  Laterality: N/A;  . COLOSTOMY TAKEDOWN N/A 08/20/2017   Procedure: LAPAROSCOPIC ASSISTED REVISION END  COLOSTOMY AND ENTEROLYSIS OF ADHESIONS;  Surgeon: Alphonsa Overall, MD;  Location: [facility];  Service: General;  Laterality: N/A;  ERAS PATHWAY  . ESOPHAGOGASTRODUODENOSCOPY N/A 05/31/2017   Procedure: ESOPHAGOGASTRODUODENOSCOPY (EGD);  Surgeon: Milus Banister, MD;  Location[facility];  Service: Endoscopy;  Laterality: N/A;  . gamma knife radiation surgery  05/2010   for pituitary  . IR RADIOLOGIST EVAL & MGMT  02/12/2017  . IR RADIOLOGIST EVAL & MGMT  03/17/2017  . IR RADIOLOGIST EVAL & MGMT  04/14/2017  . IR RADIOLOGIST EVAL & MGMT  02/05/2017  . PITUITARY SURGERY  2007   nose approach  . ROBOT ASSISTED LAPAROSCOPIC NEPHRECTOMY  04/23/2012   Procedure: ROBOTIC ASSISTED LAPAROSCOPIC NEPHRECTOMY;  Surgeon: Alexis Frock, MD;  Location: [facility];  Service: Urology;  Laterality: Left;  radical  . stomach peg  02/1993   removed 5-6 years later  . TRACHEOSTOMY TUBE PLACEMENT  02/1993   removed 04/1993  . UMBILICAL HERNIA REPAIR  04/23/2012   Procedure: HERNIA REPAIR UMBILICAL ADULT;  Surgeon: Alexis Frock, MD;  [facility];  Service: Urology;;  . Michelene Gardener  1984  . vocal cord surgery     injected with collagen, then fat from stomach to improve speech s/p stroke   Family History  Problem Relation Age of Onset  . Alcohol abuse Father   . Throat cancer Father   . Esophageal cancer Father 21  . Arthritis Mother   . Hyperlipidemia Mother   . Uterine cancer Mother 48  . Colon cancer Brother   . Arthritis Maternal Grandmother   . Hypertension Brother   . Stroke Paternal Grandmother    Social History   Socioeconomic History  . Marital status: Married    Spouse name: Not on file  . Number of children: 3  . Years of education: Not on file  . Highest education level: Not on file  Occupational History  . Occupation: disabled    Fish farm manager: UNEMPLOYED    Employer: [employer]    Comment: Retired  . Occupation: Psychiatrist    Comment: retired  Tobacco Use    . Smoking status: Former Smoker    Packs/day: 0.50    Years: 23.00    Pack years: 11.50    Types: Cigarettes    Quit date: 09/24/1992    Years since quitting: 27.5  . Smokeless tobacco: Never Used  Vaping Use  . Vaping Use: Never used  Substance and Sexual Activity  . Alcohol use: No    Comment: rare  . Drug use: No  . Sexual activity: Not on file  Other Topics Concern  . Not on file  Social History Narrative   Retired, disabled 1994 Quadreplegic      10/13/2018:   Originally from [place].   Lives with wife on one level apartment. Wife Peter Congo is main caregiver.   Has 2 daughters, 1 son, 6 grandchildren.   Loves spending time with grandchildren, some of whom are local and visit frequently. Some family still in [place] with whom he face-times.   Attends church, active with church community with Peter Congo   Enjoys working on the computer, going outside in his motorized wheelchair   Social Determinants of Health   Financial Resource Strain: Low Risk   . Difficulty of Paying Living Expenses: Not hard at all  Food Insecurity: No Food Insecurity  . Worried About Charity fundraiser in the Last Year: Never true  . Ran Out of Food in the Last Year: Never true  Transportation Needs: No Transportation Needs  . Lack of Transportation (Medical): No  . Lack of Transportation (Non-Medical): No  Physical Activity: Inactive  . Days of Exercise per Week: 0 days  . Minutes of Exercise per Session: 0 min  Stress: No Stress Concern Present  . Feeling of Stress : Not at all  Social Connections: Moderately Integrated  . Frequency of Communication with Friends and Family: More than three times a week  . Frequency of Social Gatherings with Friends and Family: Once a week  . Attends Religious Services: More than 4 times per year  . Active Member of Clubs or Organizations: No  . Attends Archivist Meetings: Never  . Marital Status: Married    Tobacco Counseling Counseling given: Not  Answered   Clinical Intake:  Pre-visit preparation completed: Yes  Pain : 0-10 Pain Score: 4  Pain Type: Chronic pain Pain Location: Neck Pain Descriptors / Indicators: Sore Pain Onset: More than a month ago Pain Relieving Factors: Pain patches  Pain Relieving Factors: Pain patches  Nutritional Risks: None Diabetes: No (Borderline diabetic) CBG  done?: No Did pt. bring in CBG monitor from home?: No  How often do you need to have someone help you when you read instructions, pamphlets, or other written materials from your doctor or pharmacy?: 1 - Never What is the last grade level you completed in school?: Some college  Diabetic?Yes  Interpreter Needed?: No  Information entered by :: [place] of Daily Living In your present state of health, do you have any difficulty performing the following activities: 03/29/2020 01/14/2020  Hearing? N N  Vision? N N  Difficulty concentrating or making decisions? N N  Walking or climbing stairs? Y Y  Comment [place] or bathing? Y Y  Comment quadriaplegic -  Doing errands, shopping? Y N  Preparing Food and eating ? Y -  Using the Toilet? Y -  In the past six months, have you accidently leaked urine? N -  Do you have problems with loss of bowel control? N -  Managing your Medications? Y -  Managing your Finances? Y -  Housekeeping or managing your Housekeeping? Y -  Some recent data might be hidden    Patient Care Team: [name], MD as PCP - General Alen Blew, Mathis Dad, MD as Consulting Physician (Oncology) Pyrtle, Lajuan Lines, MD as Consulting Physician (Gastroenterology) Alphonsa Overall, MD as Consulting Physician (General Surgery) Earnie Larsson, [facility] (Pharmacist) Julianne Handler, NP as Nurse Practitioner [facility])  Indicate any recent Medical Services you may have received from other than [facility] providers in the past year (date may be approximate).     Assessment:    This is a routine wellness examination for Tywone.  Hearing/Vision screen  Hearing Screening   _0  _1  _2  _3  _4  _5  _6  _7  _8   Right ear:           Left ear:           Vision Screening Comments: Patient states gets eye examined yearly except for since the pandemic    Dietary issues and exercise activities discussed: Current Exercise Habits: The patient does not participate in regular exercise at present (Patient is a quadriplegic patient), Exercise limited by: neurologic condition(s)  Goals    . Patient Stated     Get UTI and coughing under control! Keep staying active with family!    . Patient Stated     I would like to have my pain controlled     . Pharmacy Care Plan     Current Barriers:  . Chronic Disease Management support, education, and care coordination needs related to HTN  Pharmacist Clinical Goal(s):  Marland Kitchen Over the next 30 days, patient will continue checking blood pressure and reporting blood pressure readings.   Interventions: . Discussed reduction in sodium intake.  Marland Kitchen Collaboration with provider re: medication management  Patient Self Care Activities:  . Patient verbalizes understanding of plan to continuing blood pressure regimen.  Initial goal documentation       Depression Screen PHQ 2/9 Scores 03/29/2020 10/13/2018 10/13/2018 08/25/2017 06/19/2017 06/03/2017 02/19/2017  PHQ - 2 Score 0 1 0 0 0 0 0  PHQ- 9 Score 0 2 0 - - - -    Fall Risk Fall Risk  03/29/2020 10/13/2018 08/25/2017 06/19/2017 06/03/2017  Falls in the past year? 0 0 No No No  Number falls in past yr: 0 - - - -  Injury with Fall? 0 - - - -  Risk for fall due to : Impaired mobility Impaired balance/gait;History  of fall(s);Impaired mobility;Impaired vision - - -  Risk for fall due to: Comment - - - - -  Follow up Falls evaluation completed;Falls prevention discussed - - - -    Any stairs in or around the home? No  If so, are there any without handrails? No  Home free  of loose throw rugs in walkways, pet beds, electrical cords, etc? Yes  Adequate lighting in your home to reduce risk of falls? Yes   ASSISTIVE DEVICES UTILIZED TO PREVENT FALLS:  Life alert? No  Use of a cane, walker or w/c? Yes  Grab bars in the bathroom? No  Shower chair or bench in shower? Yes  Elevated toilet seat or a handicapped toilet? Yes     Cognitive Function:  Cognitive screening not indicated based on direct observation       Immunizations Immunization History  Administered Date(s) Administered  . Influenza Split 05/27/2011, 05/31/2012  . Influenza Whole 05/14/2009, 05/21/2010  . Influenza,inj,Quad PF,6+ Mos 04/13/2013, 06/01/2014, 05/23/2015, 06/18/2016, 04/07/2017, 04/14/2018, 04/15/2019  . PFIZER SARS-COV-2 Vaccination 11/14/2019, 12/06/2019  . Pneumococcal Polysaccharide-23 04/16/2004  . Td 11/12/2009    TDAP status: Due, Education has been provided regarding the importance of this vaccine. Advised may receive this vaccine at local pharmacy or Health Dept. Aware to provide a copy of the vaccination record if obtained from local pharmacy or Health Dept. Verbalized acceptance and understanding. Flu Vaccine status: Up to date Pneumococcal vaccine status: Declined,  Education has been provided regarding the importance of this vaccine but patient still declined. Advised may receive this vaccine at local pharmacy or Health Dept. Aware to provide a copy of the vaccination record if obtained from local pharmacy or Health Dept. Verbalized acceptance and understanding.  Covid-19 vaccine status: Completed vaccines  Qualifies for Shingles Vaccine? Yes   Zostavax completed No   Shingrix Completed?: No.    Education has been provided regarding the importance of this vaccine. Patient has been advised to call insurance company to determine out of pocket expense if they have not yet received this vaccine. Advised may also receive vaccine at local pharmacy or Health Dept. Verbalized  acceptance and understanding.  Screening Tests Health Maintenance  Topic Date Due  . Hepatitis C Screening  Never done  . OPHTHALMOLOGY EXAM  02/23/2019  . HEMOGLOBIN A1C  04/15/2019  . PNA vac Low Risk Adult (1 of 2 - PCV13) 09/25/2019  . TETANUS/TDAP  11/13/2019  . INFLUENZA VACCINE  03/11/2020  . FOOT EXAM  12/08/2028 (Originally 10/18/2014)  . COLONOSCOPY  06/01/2027  . COVID-19 Vaccine  Completed  . HIV Screening  Completed    Health Maintenance  Health Maintenance Due  Topic Date Due  . Hepatitis C Screening  Never done  . OPHTHALMOLOGY EXAM  02/23/2019  . HEMOGLOBIN A1C  04/15/2019  . PNA vac Low Risk Adult (1 of 2 - PCV13) 09/25/2019  . TETANUS/TDAP  11/13/2019  . INFLUENZA VACCINE  03/11/2020    Colorectal cancer screening: Completed 05/31/2017. Repeat every 10 years  Lung Cancer Screening: (Low Dose CT Chest recommended if Age 33-80 years, 30 pack-year currently smoking OR have quit w/in 15years.) does not qualify.   Lung Cancer Screening Referral: N/A  Additional Screening:  Hepatitis C Screening: does qualify;   Vision Screening: Recommended annual ophthalmology exams for early detection of glaucoma and other disorders of the eye. Is the patient up to date with their annual eye exam?  Yes  Who is the provider or what is the name  of the office in which the patient attends annual eye exams? [facility]  If pt is not established with a provider, would they like to be referred to a provider to establish care? No .   Dental Screening: Recommended annual dental exams for proper oral hygiene  Community Resource Referral / Chronic Care Management: CRR required this visit?  No   CCM required this visit?  No      Plan:     I have personally reviewed and noted the following in the patient's chart:   . Medical and social history . Use of alcohol, tobacco or illicit drugs  . Current medications and supplements . Functional ability and status . Nutritional  status . Physical activity . Advanced directives . List of other physicians . Hospitalizations, surgeries, and ER visits in previous 12 months . Vitals . Screenings to include cognitive, depression, and falls . Referrals and appointments  In addition, I have reviewed and discussed with patient certain preventive protocols, quality metrics, and best practice recommendations. A written personalized care plan for preventive services as well as general preventive health recommendations were provided to patient.     Ofilia Neas, LPN   3/58/2518   Nurse Notes: None

## 2020-03-29 NOTE — Patient Instructions (Signed)
Austin [name] , Thank you for taking time to come for your Medicare Wellness Visit. I appreciate your ongoing commitment to your health goals. Please review the following plan we discussed and let me know if I can assist you in the future.   Screening recommendations/referrals: Colonoscopy: Up to date, next due 06/01/2027 Recommended yearly ophthalmology/optometry visit for glaucoma screening and checkup Recommended yearly dental visit for hygiene and checkup  Vaccinations: Influenza vaccine: Up to date, next due this fall 2021 Pneumococcal vaccine: Prevnar 13 currently due, you may receive at your next office visit  Tdap vaccine: currently due, you may contact your insurance company to discuss cost or you may await and injury to receive  Shingles vaccine: Currently due, please contact your pharmacy to discuss cost and to receive     Advanced directives: Copies on file   Conditions/risks identified: None  Next appointment: 05/07/2020 @ 9:30 am with Dr. [name]  Preventive Care 65 Years and Older, Male Preventive care refers to lifestyle choices and visits with your health care provider that can promote health and wellness. What does preventive care include?  A yearly physical exam. This is also called an annual well check.  Dental exams once or twice a year.  Routine eye exams. Ask your health care provider how often you should have your eyes checked.  Personal lifestyle choices, including:  Daily care of your teeth and gums.  Regular physical activity.  Eating a healthy diet.  Avoiding tobacco and drug use.  Limiting alcohol use.  Practicing safe sex.  Taking low doses of aspirin every day.  Taking vitamin and mineral supplements as recommended by your health care provider. What happens during an annual well check? The services and screenings done by your health care provider during your annual well check will depend on your age, overall health, lifestyle risk factors,  and family history of disease. Counseling  Your health care provider may ask you questions about your:  Alcohol use.  Tobacco use.  Drug use.  Emotional well-being.  Home and relationship well-being.  Sexual activity.  Eating habits.  History of falls.  Memory and ability to understand (cognition).  Work and work Statistician. Screening  You may have the following tests or measurements:  Height, weight, and BMI.  Blood pressure.  Lipid and cholesterol levels. These may be checked every 5 years, or more frequently if you are over 16 years old.  Skin check.  Lung cancer screening. You may have this screening every year starting at age 30 if you have a 30-pack-year history of smoking and currently smoke or have quit within the past 15 years.  Fecal occult blood test (FOBT) of the stool. You may have this test every year starting at age 39.  Flexible sigmoidoscopy or colonoscopy. You may have a sigmoidoscopy every 5 years or a colonoscopy every 10 years starting at age 20.  Prostate cancer screening. Recommendations will vary depending on your family history and other risks.  Hepatitis C blood test.  Hepatitis B blood test.  Sexually transmitted disease (STD) testing.  Diabetes screening. This is done by checking your blood sugar (glucose) after you have not eaten for a while (fasting). You may have this done every 1-3 years.  Abdominal aortic aneurysm (AAA) screening. You may need this if you are a current or former smoker.  Osteoporosis. You may be screened starting at age 23 if you are at high risk. Talk with your health care provider about your test results, treatment options,  and if necessary, the need for more tests. Vaccines  Your health care provider may recommend certain vaccines, such as:  Influenza vaccine. This is recommended every year.  Tetanus, diphtheria, and acellular pertussis (Tdap, Td) vaccine. You may need a Td booster every 10  years.  Zoster vaccine. You may need this after age 63.  Pneumococcal 13-valent conjugate (PCV13) vaccine. One dose is recommended after age 50.  Pneumococcal polysaccharide (PPSV23) vaccine. One dose is recommended after age 84. Talk to your health care provider about which screenings and vaccines you need and how often you need them. This information is not intended to replace advice given to you by your health care provider. Make sure you discuss any questions you have with your health care provider. Document Released: 08/24/2015 Document Revised: 04/16/2016 Document Reviewed: 05/29/2015 Elsevier Interactive Patient Education  2017 [place] Prevention in the Home Falls can cause injuries. They can happen to people of all ages. There are many things you can do to make your home safe and to help prevent falls. What can I do on the outside of my home?  Regularly fix the edges of walkways and driveways and fix any cracks.  Remove anything that might make you trip as you walk through a door, such as a raised step or threshold.  Trim any bushes or trees on the path to your home.  Use bright outdoor lighting.  Clear any walking paths of anything that might make someone trip, such as rocks or tools.  Regularly check to see if handrails are loose or broken. Make sure that both sides of any steps have handrails.  Any raised decks and porches should have guardrails on the edges.  Have any leaves, snow, or ice cleared regularly.  Use sand or salt on walking paths during winter.  Clean up any spills in your garage right away. This includes oil or grease spills. What can I do in the bathroom?  Use night lights.  Install grab bars by the toilet and in the tub and shower. Do not use towel bars as grab bars.  Use non-skid mats or decals in the tub or shower.  If you need to sit down in the shower, use a plastic, non-slip stool.  Keep the floor dry. Clean up any water that  spills on the floor as soon as it happens.  Remove soap buildup in the tub or shower regularly.  Attach bath mats securely with double-sided non-slip rug tape.  Do not have throw rugs and other things on the floor that can make you trip. What can I do in the bedroom?  Use night lights.  Make sure that you have a light by your bed that is easy to reach.  Do not use any sheets or blankets that are too big for your bed. They should not hang down onto the floor.  Have a firm chair that has side arms. You can use this for support while you get dressed.  Do not have throw rugs and other things on the floor that can make you trip. What can I do in the kitchen?  Clean up any spills right away.  Avoid walking on wet floors.  Keep items that you use a lot in easy-to-reach places.  If you need to reach something above you, use a strong step stool that has a grab bar.  Keep electrical cords out of the way.  Do not use floor polish or wax that makes floors slippery. If  you must use wax, use non-skid floor wax.  Do not have throw rugs and other things on the floor that can make you trip. What can I do with my stairs?  Do not leave any items on the stairs.  Make sure that there are handrails on both sides of the stairs and use them. Fix handrails that are broken or loose. Make sure that handrails are as long as the stairways.  Check any carpeting to make sure that it is firmly attached to the stairs. Fix any carpet that is loose or worn.  Avoid having throw rugs at the top or bottom of the stairs. If you do have throw rugs, attach them to the floor with carpet tape.  Make sure that you have a light switch at the top of the stairs and the bottom of the stairs. If you do not have them, ask someone to add them for you. What else can I do to help prevent falls?  Wear shoes that:  Do not have high heels.  Have rubber bottoms.  Are comfortable and fit you well.  Are closed at the  toe. Do not wear sandals.  If you use a stepladder:  Make sure that it is fully opened. Do not climb a closed stepladder.  Make sure that both sides of the stepladder are locked into place.  Ask someone to hold it for you, if possible.  Clearly mark and make sure that you can see:  Any grab bars or handrails.  First and last steps.  Where the edge of each step is.  Use tools that help you move around (mobility aids) if they are needed. These include:  Canes.  Walkers.  Scooters.  Crutches.  Turn on the lights when you go into a dark area. Replace any light bulbs as soon as they burn out.  Set up your furniture so you have a clear path. Avoid moving your furniture around.  If any of your floors are uneven, fix them.  If there are any pets around you, be aware of where they are.  Review your medicines with your doctor. Some medicines can make you feel dizzy. This can increase your chance of falling. Ask your doctor what other things that you can do to help prevent falls. This information is not intended to replace advice given to you by your health care provider. Make sure you discuss any questions you have with your health care provider. Document Released: 05/24/2009 Document Revised: 01/03/2016 Document Reviewed: 09/01/2014 Elsevier Interactive Patient Education  2017 [employer].

## 2020-04-02 ENCOUNTER — Other Ambulatory Visit: Payer: Self-pay

## 2020-04-02 ENCOUNTER — Other Ambulatory Visit: Payer: Self-pay | Admitting: Family Medicine

## 2020-04-02 ENCOUNTER — Other Ambulatory Visit: Payer: PPO

## 2020-04-02 DIAGNOSIS — E039 Hypothyroidism, unspecified: Secondary | ICD-10-CM

## 2020-04-02 NOTE — Progress Notes (Signed)
Addendum: Reviewed and agree with assessment and management plan. Difficult to know what is causing the drainage from the rectum.  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   Agree with the CT which is planned for later this week. Given the clear nature it could be possible that a fistula has developed, or this could be from mild inflammation from the disconnected colon/rectum (though in the latter case I would expect more mucus or bloody discharge).   We will see what the CT shows and then determine if we can improve this symptom. Cristino Degroff, Lajuan Lines, MD

## 2020-04-02 NOTE — Progress Notes (Signed)
Per PCP TSH to be done in August/ordered/thx dmf

## 2020-04-03 ENCOUNTER — Other Ambulatory Visit: Payer: Self-pay

## 2020-04-03 DIAGNOSIS — E875 Hyperkalemia: Secondary | ICD-10-CM

## 2020-04-03 LAB — TSH: TSH: 5.83 mIU/L — ABNORMAL HIGH (ref 0.40–4.50)

## 2020-04-04 ENCOUNTER — Telehealth: Payer: Self-pay | Admitting: Gastroenterology

## 2020-04-04 ENCOUNTER — Other Ambulatory Visit: Payer: PPO

## 2020-04-04 ENCOUNTER — Other Ambulatory Visit: Payer: Self-pay

## 2020-04-04 DIAGNOSIS — R1084 Generalized abdominal pain: Secondary | ICD-10-CM

## 2020-04-04 DIAGNOSIS — E875 Hyperkalemia: Secondary | ICD-10-CM | POA: Diagnosis not present

## 2020-04-04 DIAGNOSIS — C649 Malignant neoplasm of unspecified kidney, except renal pelvis: Secondary | ICD-10-CM

## 2020-04-04 NOTE — Telephone Encounter (Signed)
Entered new order for CT Abdomen/Pelvis with and without contrast - Janett Billow can you sign this order please? Thanks

## 2020-04-05 ENCOUNTER — Encounter (HOSPITAL_COMMUNITY): Payer: Self-pay

## 2020-04-05 ENCOUNTER — Ambulatory Visit (HOSPITAL_COMMUNITY)
Admission: RE | Admit: 2020-04-05 | Discharge: 2020-04-05 | Disposition: A | Discharge status: Home / Self Care | Payer: PPO | Source: Ambulatory Visit | Attending: Gastroenterology | Admitting: Gastroenterology

## 2020-04-05 DIAGNOSIS — E278 Other specified disorders of adrenal gland: Secondary | ICD-10-CM | POA: Diagnosis not present

## 2020-04-05 DIAGNOSIS — D7389 Other diseases of spleen: Secondary | ICD-10-CM | POA: Diagnosis not present

## 2020-04-05 DIAGNOSIS — C649 Malignant neoplasm of unspecified kidney, except renal pelvis: Secondary | ICD-10-CM | POA: Diagnosis not present

## 2020-04-05 DIAGNOSIS — C642 Malignant neoplasm of left kidney, except renal pelvis: Secondary | ICD-10-CM | POA: Diagnosis not present

## 2020-04-05 DIAGNOSIS — Z933 Colostomy status: Secondary | ICD-10-CM | POA: Diagnosis not present

## 2020-04-05 DIAGNOSIS — R1084 Generalized abdominal pain: Secondary | ICD-10-CM | POA: Diagnosis not present

## 2020-04-05 DIAGNOSIS — I700 Atherosclerosis of aorta: Secondary | ICD-10-CM | POA: Diagnosis not present

## 2020-04-05 LAB — BASIC METABOLIC PANEL
BUN/Creatinine Ratio: 23 (calc) — ABNORMAL HIGH (ref 6–22)
BUN: 26 mg/dL — ABNORMAL HIGH (ref 7–25)
CO2: 22 mmol/L (ref 20–32)
Calcium: 9.2 mg/dL (ref 8.6–10.3)
Chloride: 106 mmol/L (ref 98–110)
Creat: 1.12 mg/dL (ref 0.70–1.25)
Glucose, Bld: 97 mg/dL (ref 65–99)
Potassium: 4.9 mmol/L (ref 3.5–5.3)
Sodium: 136 mmol/L (ref 135–146)

## 2020-04-05 MED ORDER — SODIUM CHLORIDE (PF) 0.9 % IJ SOLN
INTRAMUSCULAR | Status: AC
  Filled 2020-04-05: qty 50

## 2020-04-05 MED ORDER — IOHEXOL 300 MG/ML  SOLN
100.0000 mL | Freq: Once | INTRAMUSCULAR | Status: AC | PRN
  Administered 2020-04-05: 100 mL via INTRAVENOUS

## 2020-04-06 ENCOUNTER — Telehealth: Payer: Self-pay | Admitting: Family Medicine

## 2020-04-06 MED ORDER — FENTANYL 25 MCG/HR TD PT72
1.0000 | MEDICATED_PATCH | TRANSDERMAL | 0 refills | Status: DC
Start: 2020-04-06 — End: 2020-05-07

## 2020-04-06 NOTE — Telephone Encounter (Signed)
Pt is calling in needing a refill on Rx fentanyl (DURAFESIC) 25 MCG  Pharm:  [facility] on [address].  Pt stated that the he had a CT on yesterday 04/06/2020 (Dr. Hilarie Fredrickson)  there has been a increase in the tumors and that could be the reason for the increase pain. The discharge that the pt is experiencing the GI office is still looking into it they are not sure what it is.

## 2020-04-06 NOTE — Telephone Encounter (Signed)
Please advise 

## 2020-04-07 NOTE — Telephone Encounter (Signed)
I sent in prescription and wife is aware.

## 2020-04-09 ENCOUNTER — Other Ambulatory Visit: Payer: Self-pay

## 2020-04-09 MED ORDER — HYDROCORTISONE ACETATE 25 MG RE SUPP: 25.0000 mg | Freq: Every day | RECTAL | 0 refills | Status: DC

## 2020-05-01 ENCOUNTER — Telehealth: Payer: Self-pay | Admitting: Pharmacist

## 2020-05-01 DIAGNOSIS — E119 Type 2 diabetes mellitus without complications: Secondary | ICD-10-CM

## 2020-05-01 DIAGNOSIS — I10 Essential (primary) hypertension: Secondary | ICD-10-CM

## 2020-05-01 NOTE — Chronic Care Management (AMB) (Addendum)
Chronic Care Management Pharmacy Assistant   Name: LAWTON DOLLINGER  MRN: [id number] DOB: Sep 21, 1955  Reason for Encounter: Disease State  Patient Questions: ( Question and concerns were answered by Patient's wife Delando Satter HIPPA)  1.  Have you seen any other providers since your last visit? Yes Office visit: [name], MD o 03/13/2020-Started Fentanyl 12 MCG/HR 1 Place 1 patch onto the skin every 3 (three) days. Discontinued Tramadol 50 mg tablet -take one by mouth every 6 hours o 03/06/2020- Started baclofen -Take 1 tablet (10 mg total) by mouth 3 (three) times daily o 06/15-2021- Hospitalization follow-up. No medication changes Consults Visit o 01/19/2020 Oncology- Wyatt Portela, MD have discussed strategies to improve his pain well including scheduling tramadol and possibly consideration to escalate to hydrocodone or oxycodone o 03/21/2020 Gastroenterology - Loralie Champagne, PA-C Discontinued the following medication: 1. Baclofen (LIORESAL) 10 MG tablet Take 1 tablet (10 mg total) by mouth 3 (three) times daily. 2. escitalopram (LEXAPRO) 10 MG tablet Take 1 tablet by mouth in the morning. (Patient taking differently: Take 10 mg by mouth daily.)  2.  Any changes in your medicines or health? 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 (despite combined with HCTZ, no additional KCl)   Angiotensin Receptor Blockers Other (See Comments)    See ACEi   Penicillins Rash and Other (See Comments)    Has patient had a PCN reaction causing immediate rash, facial/tongue/throat swelling, SOB or lightheadedness with hypotension: YES Has patient had a PCN reaction causing severe rash involving mucus membranes or skin necrosis: No Has patient had a PCN reaction that required hospitalization No Has patient had a PCN  reaction occurring within the last 10 years: Yes  If all of the above answers are "NO", then may proceed with Cephalosporin use.    Medications: Outpatient Encounter Medications as of 05/01/2020  Medication Sig   acetaminophen (TYLENOL) 500 MG tablet Take 1 tablet (500 mg total) by mouth every 6 (six) hours as needed for mild pain, moderate pain, fever or headache. (Patient not taking: Reported on 03/29/2020)   amLODipine (NORVASC) 10 MG tablet Take 1 tablet (10 mg total) by mouth daily. (Patient taking differently: Take 5 mg by mouth daily. )   BD DISP NEEDLES 18G X 1-1/2" MISC USE TO INJECT TESTOSTERONE INTO THE MUSCLE AS DIRECTED EVERY 14 DAYS   escitalopram (LEXAPRO) 10 MG tablet TAKE 1 TABLET BY MOUTH IN THE MORNING   fentaNYL (DURAGESIC) 25 MCG/HR Place 1 patch onto the skin every 3 (three) days.   hydrocortisone (ANUSOL-HC) 25 MG suppository Place 1 suppository (25 mg total) rectally at bedtime.   ketoconazole (NIZORAL) 2 % cream APPLY AS NEEDED TO RASH (Patient taking differently: Apply 1 application topically daily as needed for irritation. )   levothyroxine (SYNTHROID) 50 MCG tablet Take 1 tablet (50 mcg total) by mouth daily.   Multiple Vitamin (MULTIVITAMIN WITH MINERALS) TABS tablet Take 1 tablet by mouth daily with breakfast.    SAFETY-LOK 3CC SYR 22GX1.5" 22G X 1-1/2" 3 ML MISC USE TO INJECT 1 ML OF TESTOSTERONE EVERY 14 DAYS   testosterone enanthate (DELATESTRYL) 200 MG/ML injection INJECT 1 ML INTRAMUSCULARLY  EVERY TWO WEEKS   traZODone (DESYREL) 100 MG tablet Take 0.5 tablets (50 mg total) by mouth at bedtime.  triamcinolone cream (KENALOG) 0.1 % Apply 1 application topically daily as needed (for rash).   No facility-administered encounter medications on file as of 05/01/2020.    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   AKI (acute kidney injury) ([place])    Colostomy in place  05/30/2017   Blood loss anemia 05/29/2017   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/28/2016   Perforation of sigmoid colon due to diverticulitis s/p colostomy 9/18 09/27/2016   Fatigue 05/23/2015   Hyperlipidemia 10/17/2013   Renal carcinoma metastatic with carcinomatosis 11/09/2012   Type 2 diabetes mellitus ([place]) 03/31/2012   Anemia associated with acute blood loss 02/28/2012   Generalized abdominal pain 02/27/2012   Low testosterone 06/21/2010   WEAKNESS 04/03/2010   FACIAL WEAKNESS 04/03/2010   EDEMA 01/15/2009   Pituitary tumor 01/15/2009   Essential hypertension 10/16/2008   Neurogenic bladder 10/16/2008   OTHER SEBORRHEIC DERMATITIS 10/16/2008   COLONIC POLYPS, HX OF 10/16/2008    Goals Addressed   None    Reviewed chart prior to disease state call. Spoke with patient regarding BP  Recent Office Vitals: BP Readings from Last 3 Encounters:  03/21/20 126/80  03/13/20 138/70  03/06/20 (!) 130/66   Pulse Readings from Last 3 Encounters:  03/21/20 74  03/13/20 76  03/06/20 82    Wt Readings from Last 3 Encounters:  03/21/20 180 lb (81.6 kg)  01/13/20 180 lb (81.6 kg)  10/17/19 180 lb (81.6 kg)     Kidney Function Lab Results  Component Value Date/Time   CREATININE 1.12 04/04/2020 10:33 AM   CREATININE 1.29 (H) 03/23/2020 10:18 AM   CREATININE 1.3 03/13/2017 09:22 AM   CREATININE 1.5 (H) 11/11/2016 09:13 AM   GFR 63.73 03/21/2020 03:28 PM   GFRNONAA 58 (L) 03/23/2020 10:18 AM   GFRAA 67 03/23/2020 10:18 AM    BMP Latest Ref Rng & Units 04/04/2020  03/23/2020 03/21/2020  Glucose 65 - 99 mg/dL 97 87 107(H)  BUN 7 - 25 mg/dL 26(H) 31(H) 29(H)  Creatinine 0.70 - 1.25 mg/dL 1.12 1.29(H) 1.15  BUN/Creat Ratio 6 - 22 (calc) 23(H) 24(H) -  Sodium 135 - 146 mmol/L 136 131(L) 128(L)  Potassium 3.5 - 5.3 mmol/L 4.9 5.7(H) 5.4 No hemolysis seen(H)  Chloride 98 - 110 mmol/L 106 100 99  CO2 20 - 32 mmol/L 22 21 21   Calcium 8.6 - 10.3 mg/dL 9.2 9.3 9.3    Current antihypertensive regimen:  o Amlodipine 10 mg sig( take 10 mg daily) Patient is taking 5 mg by mouth daily. How often are you checking your Blood Pressure? 1-2x per week When the nurse comes out to the home. Current home BP readings: 126/80 What recent interventions/DTPs have been made by any provider to improve Blood Pressure control since last CPP Visit: Amlodipine 10 mg was reduced to 1/2 tablet (5 mg) daily Any recent hospitalizations or ED visits since last visit with CPP? Yes  o 01/13/2020 - 01/16/2020 ( Hyperkalemia)  The following medications were discontinued on discharge:  Furosemide 20 mg tablet  Guaifenesin-dextromethorphan 100-10 MG/5ML syrup  Ibuprofen 200  mg tablets  Lisinopril-Hydrochlorothiazide 20-25 mg tablets The patient started the following medications:  Oral sodium bicarbonate tablets 650 mg twice a day for two weeks. What diet changes have been made to improve Blood Pressure Control?  o Low sodium heart healthy What exercise is being done to improve your Blood Pressure Control?  o None  Adherence Review: Is the patient currently on ACE/ARB medication? No Does the patient have >5 day gap between last estimated fill dates? No   Follow-Up:  Scheduled Follow-Up With Clinical Pharmacist  Maia Breslow, [place] [phone]  Follow up was scheduled with CPP. Blood pressure control is better based on recent readings.   Jeni Salles, PharmD Clinical Pharmacist [place] [facility] [place]

## 2020-05-07 ENCOUNTER — Encounter: Payer: Self-pay | Admitting: Family Medicine

## 2020-05-07 ENCOUNTER — Ambulatory Visit (INDEPENDENT_AMBULATORY_CARE_PROVIDER_SITE_OTHER): Payer: PPO | Admitting: Family Medicine

## 2020-05-07 ENCOUNTER — Other Ambulatory Visit: Payer: Self-pay

## 2020-05-07 VITALS — BP 142/78 | HR 68 | Temp 98.0°F

## 2020-05-07 DIAGNOSIS — Z23 Encounter for immunization: Secondary | ICD-10-CM

## 2020-05-07 DIAGNOSIS — I10 Essential (primary) hypertension: Secondary | ICD-10-CM | POA: Diagnosis not present

## 2020-05-07 DIAGNOSIS — C649 Malignant neoplasm of unspecified kidney, except renal pelvis: Secondary | ICD-10-CM | POA: Diagnosis not present

## 2020-05-07 DIAGNOSIS — E039 Hypothyroidism, unspecified: Secondary | ICD-10-CM | POA: Diagnosis not present

## 2020-05-07 MED ORDER — FENTANYL 25 MCG/HR TD PT72: 1.0000 | MEDICATED_PATCH | TRANSDERMAL | 0 refills | Status: DC

## 2020-05-07 NOTE — Progress Notes (Signed)
Established Patient Office Visit  Subjective:  Patient ID: Austin [name], male    DOB: [birth year]  Age: 64 y.o. MRN: [id number]  CC:  Chief Complaint  Patient presents with  . Follow-up    Doing okay    HPI MAJOR SANTERRE presents for medical follow-up.  He has history of metastatic renal cell carcinoma.  He has had some recent right-sided pain and recent imaging showed progression of his tumor.  He also has some pain from left arm met.  We had started Duragesic currently 25 micrograms per hour and this is doing a fairly good job controlling his pain.  He still has occasional breakthrough pain 6-7 out of 10.  Denies any major side effects. They do need refills at this time.  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  He has had Covid vaccine.  Still needs flu vaccine.  No new complaints at this time.  They have still been seeing oncologist regularly though they decided not to pursue any further treatments of his renal cell cancer several years ago.  They are at peace with this decision.  Past Medical History:  Diagnosis Date  . Brainstem stroke ([place]) 10/16/2008   Qualifier: Diagnosis of  By: [name] LPN, [name]    . C. difficile colitis 02/08/2017  . Cerebrovascular accident ([place]) 1994   hx of brainstem stroke, residual diminished lung capacity  . Chronic kidney disease    Left Renal Mass  . 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 Diverticulitis   . Duodenal ulcer   . GIB (gastrointestinal bleeding) 05/28/2017  . Hx of colonic polyps   . Hypertension    has been off BP meds since GI bleed was thought to have been caused by previously prescribed lisinopril   . Hypogonadism   . Intra-abdominal abscess ([place]) 01/23/2017  . Iron deficiency anemia   . 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  Neuromuscular disorder ([place])    quadraplegic  . Open abdominal incision with drainage 01/26/2017  . Pituitary adenoma ([place])   . 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 Pneumonia   . PONV (postoperative nausea and vomiting)    vomited after colostomy placement   . Pre-diabetes    last a1c 5.6   . 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[place])   . Urinary incontinence   . Venous stasis    edema    Past Surgical History:  Procedure Laterality Date  . CHOLECYSTECTOMY    . COLON RESECTION N/A 05/04/2017   Procedure: LAPAROSCOPIC SIGMOID COLON RESECTION WITH END COLSOTOMY ERAS PATHWAY;  Surgeon: Alphonsa Overall, MD;  Location: [facility];  Service: General;  Laterality: N/A;  . COLONOSCOPY  02/27/2012   Procedure: COLONOSCOPY;  Surgeon: Jerene Bears, MD;  Location: [facility];  Service: Gastroenterology;  Laterality: N/A;  . COLONOSCOPY N/A 05/31/2017   Procedure: COLONOSCOPY;  Surgeon: Milus Banister, MD;  Location: [facility];  Service: Endoscopy;  Laterality: N/A;  . COLOSTOMY TAKEDOWN N/A 08/20/2017   Procedure: LAPAROSCOPIC ASSISTED REVISION END COLOSTOMY AND ENTEROLYSIS OF ADHESIONS;  Surgeon: Alphonsa Overall, MD;  Location: [facility];  Service: General;  Laterality: N/A;  ERAS PATHWAY  . ESOPHAGOGASTRODUODENOSCOPY N/A 05/31/2017   Procedure: ESOPHAGOGASTRODUODENOSCOPY (EGD);  Surgeon: Milus Banister, MD;  Location: [facility];  Service: Endoscopy;  Laterality: N/A;  . gamma knife radiation surgery  05/2010   for pituitary  . IR RADIOLOGIST EVAL & MGMT  02/12/2017  . IR RADIOLOGIST EVAL & MGMT  03/17/2017  . IR RADIOLOGIST EVAL & MGMT  04/14/2017  . IR RADIOLOGIST EVAL & MGMT  02/05/2017  . PITUITARY SURGERY  2007   nose approach  . ROBOT ASSISTED LAPAROSCOPIC NEPHRECTOMY  04/23/2012   Procedure: ROBOTIC ASSISTED LAPAROSCOPIC NEPHRECTOMY;  Surgeon: Alexis Frock, MD;  Location: [facility];  Service: Urology;  Laterality: Left;  radical  . stomach peg  02/1993   removed 5-6 years later   . TRACHEOSTOMY TUBE PLACEMENT  02/1993   removed 04/1993  . UMBILICAL HERNIA REPAIR  04/23/2012   Procedure: HERNIA REPAIR UMBILICAL ADULT;  Surgeon: Alexis Frock, MD;  Location: [facility];  Service: Urology;;  . [name]  . vocal cord surgery     injected with collagen, then fat from stomach to improve speech s/p stroke    Family History  Problem Relation Age of Onset  . Alcohol abuse Father   . Throat cancer Father   . Esophageal cancer Father 72  . Arthritis Mother   . Hyperlipidemia Mother   . Uterine cancer Mother 4  . Colon cancer Brother   . Arthritis Maternal Grandmother   . Hypertension Brother   . Stroke Paternal Grandmother     Social History   Socioeconomic History  . Marital status: Married    Spouse name: Not on file  . Number of children: 3  . Years of education: Not on file  . Highest education level: Not on file  Occupational History  . Occupation: disabled    Fish farm manager: UNEMPLOYED    Employer: [employer]    Comment: Retired  . Occupation: Psychiatrist    Comment: retired  Tobacco Use  . Smoking status: Former Smoker    Packs/day: 0.50    Years: 23.00    Pack years: 11.50    Types: Cigarettes    Quit date: 09/24/1992    Years since quitting: 27.6  . Smokeless tobacco: Never Used  Vaping Use  . Vaping Use: Never used  Substance and Sexual Activity  . Alcohol use: No    Comment: rare  . Drug use: No  . Sexual activity: Not on file  Other Topics Concern  . Not on file  Social History Narrative   Retired, disabled 1994 Quadreplegic      10/13/2018:   Originally from [place].   Lives with wife on one level apartment. Wife Peter Congo is main caregiver.   Has 2 daughters, 1 son, 6 grandchildren.   Loves spending time with grandchildren, some of whom are local and visit frequently. Some family still in [place] with whom he face-times.   Attends church, active with church community with Peter Congo   Enjoys working on the  computer, going outside in his motorized wheelchair   Social Determinants of Health   Financial Resource Strain: Low Risk   . Difficulty of Paying Living Expenses: Not hard at all  Food Insecurity: No Food Insecurity  . Worried About Charity fundraiser in the Last Year: Never true  . Ran Out of Food in the Last Year: Never true  Transportation Needs: No Transportation Needs  . Lack of Transportation (Medical): No  . Lack of Transportation (Non-Medical): No  Physical Activity: Inactive  . Days of Exercise per Week: 0 days  . Minutes of Exercise per Session: 0 min  Stress: No Stress Concern Present  . Feeling  of Stress : Not at all  Social Connections: Moderately Integrated  . Frequency of Communication with Friends and Family: More than three times a week  . Frequency of Social Gatherings with Friends and Family: Once a week  . Attends Religious Services: More than 4 times per year  . Active Member of Clubs or Organizations: No  . Attends Archivist Meetings: Never  . Marital Status: Married  Human resources officer Violence: Not At Risk  . Fear of Current or Ex-Partner: No  . Emotionally Abused: No  . Physically Abused: No  . Sexually Abused: No    Outpatient Medications Prior to Visit  Medication Sig Dispense Refill  . acetaminophen (TYLENOL) 500 MG tablet Take 1 tablet (500 mg total) by mouth every 6 (six) hours as needed for mild pain, moderate pain, fever or headache. 30 tablet 0  . amLODipine (NORVASC) 10 MG tablet Take 1 tablet (10 mg total) by mouth daily. (Patient taking differently: Take 5 mg by mouth daily. ) 90 tablet 3  . BD DISP NEEDLES 18G X 1-1/2" MISC USE TO INJECT TESTOSTERONE INTO THE MUSCLE AS DIRECTED EVERY 14 DAYS 10 each 0  . escitalopram (LEXAPRO) 10 MG tablet TAKE 1 TABLET BY MOUTH IN THE MORNING 90 tablet 0  . hydrocortisone (ANUSOL-HC) 25 MG suppository Place 1 suppository (25 mg total) rectally at bedtime. 7 suppository 0  . ketoconazole (NIZORAL)  2 % cream APPLY AS NEEDED TO RASH (Patient taking differently: Apply 1 application topically daily as needed for irritation. ) 60 g 0  . levothyroxine (SYNTHROID) 50 MCG tablet Take 1 tablet (50 mcg total) by mouth daily. 90 tablet 3  . Multiple Vitamin (MULTIVITAMIN WITH MINERALS) TABS tablet Take 1 tablet by mouth daily with breakfast.     . SAFETY-LOK 3CC SYR 22GX1.5" 22G X 1-1/2" 3 ML MISC USE TO INJECT 1 ML OF TESTOSTERONE EVERY 14 DAYS 10 each 0  . testosterone enanthate (DELATESTRYL) 200 MG/ML injection INJECT 1 ML INTRAMUSCULARLY  EVERY TWO WEEKS 10 mL 0  . traZODone (DESYREL) 100 MG tablet Take 0.5 tablets (50 mg total) by mouth at bedtime. (Patient taking differently: Take 100 mg by mouth at bedtime. ) 45 tablet 3  . triamcinolone cream (KENALOG) 0.1 % Apply 1 application topically daily as needed (for rash).    . fentaNYL (DURAGESIC) 25 MCG/HR Place 1 patch onto the skin every 3 (three) days. 10 patch 0   No facility-administered medications prior to visit.    Allergies  Allergen Reactions  . Ace Inhibitors Other (See Comments)    Solitary kidney, experienced severe hyperkalemia on lisinopril (despite combined with HCTZ, no additional KCl)  . Angiotensin Receptor Blockers Other (See Comments)    See ACEi  . Penicillins Rash and Other (See Comments)    Has patient had a PCN reaction causing immediate rash, facial/tongue/throat swelling, SOB or lightheadedness with hypotension: YES Has patient had a PCN reaction causing severe rash involving mucus membranes or skin necrosis: No Has patient had a PCN reaction that required hospitalization No Has patient had a PCN reaction occurring within the last 10 years: Yes  If all of the above answers are "NO", then may proceed with Cephalosporin use.    ROS Review of Systems  Constitutional: Positive for fatigue. Negative for chills and unexpected weight change.  Respiratory: Negative for cough.   Cardiovascular: Negative for chest pain.    Gastrointestinal: Negative for abdominal pain.  Neurological: Negative for headaches.  Psychiatric/Behavioral: Negative for confusion.  Objective:    Physical Exam Vitals reviewed.  Cardiovascular:     Rate and Rhythm: Normal rate and regular rhythm.  Pulmonary:     Breath sounds: Normal breath sounds.  Neurological:     Mental Status: He is alert.     BP (!) 142/78   Pulse 68   Temp 98 F (36.7 C) (Oral)   SpO2 95%  Wt Readings from Last 3 Encounters:  03/21/20 180 lb (81.6 kg)  01/13/20 180 lb (81.6 kg)  10/17/19 180 lb (81.6 kg)     Health Maintenance Due  Topic Date Due  . Hepatitis C Screening  Never done  . OPHTHALMOLOGY EXAM  02/23/2019  . HEMOGLOBIN A1C  04/15/2019  . PNA vac Low Risk Adult (1 of 2 - PCV13) 09/25/2019  . TETANUS/TDAP  11/13/2019    There are no preventive care reminders to display for this patient.  Lab Results  Component Value Date   TSH 5.83 (H) 04/02/2020   Lab Results  Component Value Date   WBC 10.4 01/25/2020   HGB 10.2 (L) 01/25/2020   HCT 30.2 (L) 01/25/2020   MCV 87.3 01/25/2020   PLT 269.0 01/25/2020   Lab Results  Component Value Date   NA 136 04/04/2020   K 4.9 04/04/2020   CHLORIDE 106 03/13/2017   CO2 22 04/04/2020   GLUCOSE 97 04/04/2020   BUN 26 (H) 04/04/2020   CREATININE 1.12 04/04/2020   BILITOT 0.3 01/15/2020   ALKPHOS 52 01/15/2020   AST 13 (L) 01/15/2020   ALT 15 01/15/2020   PROT 6.5 01/15/2020   ALBUMIN 3.0 (L) 01/15/2020   CALCIUM 9.2 04/04/2020   ANIONGAP 8 01/16/2020   EGFR 56 (L) 03/13/2017   GFR 63.73 03/21/2020   Lab Results  Component Value Date   CHOL 193 04/14/2018   Lab Results  Component Value Date   HDL 31.50 (L) 04/14/2018   Lab Results  Component Value Date   LDLCALC 72 12/22/2016   Lab Results  Component Value Date   TRIG 313.0 (H) 04/14/2018   Lab Results  Component Value Date   CHOLHDL 6 04/14/2018   Lab Results  Component Value Date   HGBA1C 5.9 (A)  10/13/2018      Assessment & Plan:   #1 metastatic renal cell carcinoma with mets to the left arm and abdominal region.  He has had moderate to severe pain which is improved on Duragesic patch  -Refill Duragesic 25 mcg/h.  We discussed possible supplement for breakthrough pain at this point I feel his pain is reasonably well controlled.  #2 hypertension stable -Continue current medications  #3 history of hypothyroidism on replacement.  Last TSH was reviewed and slightly elevated -Repeat TSH at follow-up  Meds ordered this encounter  Medications  . fentaNYL (DURAGESIC) 25 MCG/HR    Sig: Place 1 patch onto the skin every 3 (three) days.    Dispense:  10 patch    Refill:  0    Follow-up: Return in about 3 months (around 08/06/2020).    Carolann Littler, MD

## 2020-05-21 ENCOUNTER — Other Ambulatory Visit: Payer: Self-pay | Admitting: Oncology

## 2020-05-21 DIAGNOSIS — C649 Malignant neoplasm of unspecified kidney, except renal pelvis: Secondary | ICD-10-CM

## 2020-05-22 ENCOUNTER — Other Ambulatory Visit: Payer: Self-pay

## 2020-05-22 ENCOUNTER — Inpatient Hospital Stay: Payer: PPO

## 2020-05-22 ENCOUNTER — Inpatient Hospital Stay: Payer: PPO | Attending: Oncology | Admitting: Oncology

## 2020-05-22 ENCOUNTER — Other Ambulatory Visit: Payer: Self-pay | Admitting: Hematology

## 2020-05-22 VITALS — BP 175/69 | HR 75 | Temp 97.8°F | Resp 20 | Ht 72.0 in

## 2020-05-22 DIAGNOSIS — C786 Secondary malignant neoplasm of retroperitoneum and peritoneum: Secondary | ICD-10-CM | POA: Diagnosis not present

## 2020-05-22 DIAGNOSIS — C7800 Secondary malignant neoplasm of unspecified lung: Secondary | ICD-10-CM | POA: Insufficient documentation

## 2020-05-22 DIAGNOSIS — Z905 Acquired absence of kidney: Secondary | ICD-10-CM | POA: Insufficient documentation

## 2020-05-22 DIAGNOSIS — Z9221 Personal history of antineoplastic chemotherapy: Secondary | ICD-10-CM | POA: Insufficient documentation

## 2020-05-22 DIAGNOSIS — C649 Malignant neoplasm of unspecified kidney, except renal pelvis: Secondary | ICD-10-CM | POA: Insufficient documentation

## 2020-05-22 DIAGNOSIS — C7951 Secondary malignant neoplasm of bone: Secondary | ICD-10-CM | POA: Diagnosis not present

## 2020-05-22 DIAGNOSIS — Z79899 Other long term (current) drug therapy: Secondary | ICD-10-CM | POA: Insufficient documentation

## 2020-05-22 DIAGNOSIS — Z923 Personal history of irradiation: Secondary | ICD-10-CM | POA: Insufficient documentation

## 2020-05-22 LAB — CMP (CANCER CENTER ONLY)
ALT: 11 U/L (ref 0–44)
AST: 9 U/L — ABNORMAL LOW (ref 15–41)
Albumin: 3 g/dL — ABNORMAL LOW (ref 3.5–5.0)
Alkaline Phosphatase: 66 U/L (ref 38–126)
Anion gap: 7 (ref 5–15)
BUN: 38 mg/dL — ABNORMAL HIGH (ref 8–23)
CO2: 23 mmol/L (ref 22–32)
Calcium: 9.7 mg/dL (ref 8.9–10.3)
Chloride: 107 mmol/L (ref 98–111)
Creatinine: 1.34 mg/dL — ABNORMAL HIGH (ref 0.61–1.24)
GFR, Estimated: 55 mL/min — ABNORMAL LOW (ref >60)
Glucose, Bld: 112 mg/dL — ABNORMAL HIGH (ref 70–99)
Potassium: 4.6 mmol/L (ref 3.5–5.1)
Sodium: 137 mmol/L (ref 135–145)
Total Bilirubin: <0.2 mg/dL — ABNORMAL LOW (ref 0.3–1.2)
Total Protein: 7.7 g/dL (ref 6.5–8.1)

## 2020-05-22 LAB — CBC WITH DIFFERENTIAL (CANCER CENTER ONLY)
Abs Immature Granulocytes: 0.08 10*3/uL — ABNORMAL HIGH (ref 0.00–0.07)
Basophils Absolute: 0.1 10*3/uL (ref 0.0–0.1)
Basophils Relative: 1 %
Eosinophils Absolute: 0.4 10*3/uL (ref 0.0–0.5)
Eosinophils Relative: 4 %
HCT: 34.4 % — ABNORMAL LOW (ref 39.0–52.0)
Hemoglobin: 10.7 g/dL — ABNORMAL LOW (ref 13.0–17.0)
Immature Granulocytes: 1 %
Lymphocytes Relative: 10 %
Lymphs Abs: 1.1 10*3/uL (ref 0.7–4.0)
MCH: 27 pg (ref 26.0–34.0)
MCHC: 31.1 g/dL (ref 30.0–36.0)
MCV: 86.9 fL (ref 80.0–100.0)
Monocytes Absolute: 1.1 10*3/uL — ABNORMAL HIGH (ref 0.1–1.0)
Monocytes Relative: 10 %
Neutro Abs: 8.1 10*3/uL — ABNORMAL HIGH (ref 1.7–7.7)
Neutrophils Relative %: 74 %
Platelet Count: 220 10*3/uL (ref 150–400)
RBC: 3.96 MIL/uL — ABNORMAL LOW (ref 4.22–5.81)
RDW: 17 % — ABNORMAL HIGH (ref 11.5–15.5)
WBC Count: 10.9 10*3/uL — ABNORMAL HIGH (ref 4.0–10.5)
nRBC: 0 % (ref 0.0–0.2)

## 2020-05-22 NOTE — Progress Notes (Signed)
Hematology and Oncology Follow Up Visit  Austin [name] [id number] 1956-03-28 64 y.o. 05/22/2020 9:59 AM [name], [name], MDBurchette, [name], MD   Principle Diagnosis: 64 year old man with stage IV renal cell carcinoma developed in 2015 with documented pulmonary, peritoneal and bone disease.  He was initially diagnosed in 2013 with localized disease.   Prior Therapy:   1. He is status post robotic-assisted laparoscopic nephrectomy on the left done on 04/23/2012. The pathology revealed renal cell carcinoma with a grade 3/4 with the pathological staging of T3a.   2. Votrient 800 mg daily started around 12/27/2013. This was reduced to 400 mg subsequently and have been discontinued since 02/03/2014 due to poor tolerance.  3.  He is status post radiation therapy to the left upper arm.  He received 40 Gray in 10 fractions completed on November 11, 2019.  Current therapy: Supportive care only.  He declined anticancer therapy.  Interim History: Mr. Strey returns today for a follow-up visit.  Since last visit, he has increased abdominal pain that has developed in the interim.  CT scan obtained at in August 2021 personally reviewed and showed worsening metastatic disease in his peritoneal area.  He was started on fentanyl patch 25 mcg which has improved his pain in the interim prescribed by Dr. [name].  Clinically, he reports no major changes in his health.  He reports his pain for the most part manageable although he has few days where he aches more than average.  He still limited in his performance status quality of life but no major decline.     Medications: Updated on review. Current Outpatient Medications  Medication Sig Dispense Refill  . acetaminophen (TYLENOL) 500 MG tablet Take 1 tablet (500 mg total) by mouth every 6 (six) hours as needed for mild pain, moderate pain, fever or headache. 30 tablet 0  . amLODipine (NORVASC) 10 MG tablet Take 1 tablet (10 mg total) by mouth daily.  (Patient taking differently: Take 5 mg by mouth daily. ) 90 tablet 3  . BD DISP NEEDLES 18G X 1-1/2" MISC USE TO INJECT TESTOSTERONE INTO THE MUSCLE AS DIRECTED EVERY 14 DAYS 10 each 0  . escitalopram (LEXAPRO) 10 MG tablet TAKE 1 TABLET BY MOUTH IN THE MORNING 90 tablet 0  . fentaNYL (DURAGESIC) 25 MCG/HR Place 1 patch onto the skin every 3 (three) days. 10 patch 0  . hydrocortisone (ANUSOL-HC) 25 MG suppository Place 1 suppository (25 mg total) rectally at bedtime. 7 suppository 0  . ketoconazole (NIZORAL) 2 % cream APPLY AS NEEDED TO RASH (Patient taking differently: Apply 1 application topically daily as needed for irritation. ) 60 g 0  . levothyroxine (SYNTHROID) 50 MCG tablet Take 1 tablet (50 mcg total) by mouth daily. 90 tablet 3  . Multiple Vitamin (MULTIVITAMIN WITH MINERALS) TABS tablet Take 1 tablet by mouth daily with breakfast.     . SAFETY-LOK 3CC SYR 22GX1.5" 22G X 1-1/2" 3 ML MISC USE TO INJECT 1 ML OF TESTOSTERONE EVERY 14 DAYS 10 each 0  . testosterone enanthate (DELATESTRYL) 200 MG/ML injection INJECT 1 ML INTRAMUSCULARLY  EVERY TWO WEEKS 10 mL 0  . traZODone (DESYREL) 100 MG tablet Take 0.5 tablets (50 mg total) by mouth at bedtime. (Patient taking differently: Take 100 mg by mouth at bedtime. ) 45 tablet 3  . triamcinolone cream (KENALOG) 0.1 % Apply 1 application topically daily as needed (for rash).     No current facility-administered medications for this visit.  Allergies:  Allergies  Allergen Reactions  . Ace Inhibitors Other (See Comments)    Solitary kidney, experienced severe hyperkalemia on lisinopril (despite combined with HCTZ, no additional KCl)  . Angiotensin Receptor Blockers Other (See Comments)    See ACEi  . Penicillins Rash and Other (See Comments)    Has patient had a PCN reaction causing immediate rash, facial/tongue/throat swelling, SOB or lightheadedness with hypotension: YES Has patient had a PCN reaction causing severe rash involving mucus  membranes or skin necrosis: No Has patient had a PCN reaction that required hospitalization No Has patient had a PCN reaction occurring within the last 10 years: Yes  If all of the above answers are "NO", then may proceed with Cephalosporin use.        Physical Exam:  Blood pressure (!) 175/69, pulse 75, temperature 97.8 F (36.6 C), resp. rate 20, height 6' (1.829 m), SpO2 100 %.    ECOG: 2   General appearance: Alert, awake without any distress. Head: Atraumatic without abnormalities Oropharynx: Without any thrush or ulcers. Eyes: No scleral icterus. Lymph nodes: No lymphadenopathy noted in the cervical, supraclavicular, or axillary nodes Heart:regular rate and rhythm, without any murmurs or gallops.   Lung: Clear to auscultation without any rhonchi, wheezes or dullness to percussion. Abdomin: Soft, nontender without any shifting dullness or ascites. Musculoskeletal: No clubbing or cyanosis. Neurological: No motor or sensory deficits. Skin: No rashes or lesions.         Lab Results: Lab Results  Component Value Date   WBC 10.4 01/25/2020   HGB 10.2 (L) 01/25/2020   HCT 30.2 (L) 01/25/2020   MCV 87.3 01/25/2020   PLT 269.0 01/25/2020     Chemistry      Component Value Date/Time   NA 136 04/04/2020 1033   NA 138 03/13/2017 0922   K 4.9 04/04/2020 1033   K 4.6 03/13/2017 0922   CL 106 04/04/2020 1033   CO2 22 04/04/2020 1033   CO2 24 03/13/2017 0922   BUN 26 (H) 04/04/2020 1033   BUN 27.4 (H) 03/13/2017 0922   CREATININE 1.12 04/04/2020 1033   CREATININE 1.3 03/13/2017 0922      Component Value Date/Time   CALCIUM 9.2 04/04/2020 1033   CALCIUM 9.3 03/13/2017 0922   ALKPHOS 52 01/15/2020 0419   ALKPHOS 91 03/13/2017 0922   AST 13 (L) 01/15/2020 0419   AST 12 (L) 01/13/2020 1329   AST 17 03/13/2017 0922   ALT 15 01/15/2020 0419   ALT 16 01/13/2020 1329   ALT 21 03/13/2017 0922   BILITOT 0.3 01/15/2020 0419   BILITOT 0.3 01/13/2020 1329    BILITOT 0.27 03/13/2017 0922     IMPRESSION: 1. Marked progression of tumor deposition along the peritoneal surfaces, particularly the right paracolic gutter, omentum, liver capsule, and mesentery. One of the new/enlarging tumor deposits along the liver capsule is measured up to 3.5 by 1.8 cm, previously 1.3 by 0.8 cm. 2. Stable pulmonary nodules in the lung bases favoring metastatic lesions. However, there is a new small right pleural effusion along with speckled density in the posterior basal segment right lower lobe which may be from chronically aspirated barium. 3. Stable hypodense splenic lesion and bilateral adrenal nodules. 4. Essentially stable irregular nodularity along the left nephrectomy site. Stable hyperdense exophytic lesion from the right kidney lower pole, hyperdense on precontrast imaging, but indeterminate for enhancement. 5. Other imaging findings of potential clinical significance: Coronary atherosclerosis. Bilateral gynecomastia. 6. Emphysema and aortic atherosclerosis.  Impression and Plan:  64 year old man with:  1.  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    He is currently on active surveillance after declined anticancer therapy for quality of life issues.  CT scan obtained on April 05, 2020 was reviewed and showed progression of disease specifically in the peritoneal surfaces.  The natural course of this disease was reviewed today and treatment options were reiterated.  It is not surprising his disease progression at this time and appears to be accelerating.  The option of continued supportive management versus anticancer therapy was discussed.  The role of hospice was also discussed in the future.  For the time being continue supportive management and he has declined hospice enrollment at this time.   2. Prognosis: Overall prognosis is poor with limited life expectancy given his disease  acceleration.  Any anticancer treatment will offer limited palliation at this time which could interfere with his quality of life.   3.  Pain: Manageable at this time with fentanyl patch.  I agree with this approach and he understands that increasing the dose may be required in the future.   4. Followup: In 4 months for repeat evaluation.  30  minutes were spent on this encounter.  The time was dedicated to reviewing his imaging studies updating his disease status and outlining future plan of care.   Zola Button, MD 10/12/20219:59 AM

## 2020-05-23 ENCOUNTER — Other Ambulatory Visit: Payer: Self-pay | Admitting: Family Medicine

## 2020-05-23 DIAGNOSIS — Z933 Colostomy status: Secondary | ICD-10-CM | POA: Diagnosis not present

## 2020-06-04 ENCOUNTER — Ambulatory Visit: Payer: PPO | Admitting: Pharmacist

## 2020-06-04 DIAGNOSIS — I10 Essential (primary) hypertension: Secondary | ICD-10-CM

## 2020-06-04 DIAGNOSIS — E039 Hypothyroidism, unspecified: Secondary | ICD-10-CM

## 2020-06-04 NOTE — Chronic Care Management (AMB) (Signed)
Chronic Care Management   Follow Up Note 06/08/2020 Name: Austin [name] MRN: [id number] DOB: 11-03-1955  Referred by: [name], MD Reason for referral : Chronic Care Management (Pharmacist follow up)   SHERRON MUMMERT is a 64 y.o. year old male who is a primary care patient of [name], [name], MD. The CCM team was consulted for assistance with chronic disease management and care coordination needs.    Review of patient status, including review of consultants reports, relevant laboratory and other test results, and collaboration with appropriate care team members and the patient's provider was performed as part of comprehensive patient evaluation and provision of chronic care management services.     SDOH (Social Determinants of Health) assessments performed: Yes See Care Plan activities for detailed interventions related to [facility])     Outpatient Encounter Medications as of 06/04/2020  Medication Sig  . amLODipine (NORVASC) 10 MG tablet Take 1 tablet (10 mg total) by mouth daily. (Patient taking differently: Take 5 mg by mouth daily. )  . escitalopram (LEXAPRO) 10 MG tablet TAKE 1 TABLET BY MOUTH IN THE MORNING  . levothyroxine (SYNTHROID) 50 MCG tablet Take 1 tablet (50 mcg total) by mouth daily.  Marland Kitchen testosterone enanthate (DELATESTRYL) 200 MG/ML injection INJECT 1 ML INTRAMUSCULARLY  EVERY TWO WEEKS  . traMADol (ULTRAM) 50 MG tablet Take by mouth every 6 (six) hours as needed.  . traZODone (DESYREL) 100 MG tablet TAKE 1 TABLET BY MOUTH AT BEDTIME  . [DISCONTINUED] fentaNYL (DURAGESIC) 25 MCG/HR Place 1 patch onto the skin every 3 (three) days.  Marland Kitchen acetaminophen (TYLENOL) 500 MG tablet Take 1 tablet (500 mg total) by mouth every 6 (six) hours as needed for mild pain, moderate pain, fever or headache.  . BD DISP NEEDLES 18G X 1-1/2" MISC USE TO INJECT TESTOSTERONE INTO THE MUSCLE AS DIRECTED EVERY 14 DAYS  . hydrocortisone (ANUSOL-HC) 25 MG suppository Place 1 suppository  (25 mg total) rectally at bedtime.  Marland Kitchen ketoconazole (NIZORAL) 2 % cream APPLY AS NEEDED TO RASH (Patient taking differently: Apply 1 application topically daily as needed for irritation. )  . Multiple Vitamin (MULTIVITAMIN WITH MINERALS) TABS tablet Take 1 tablet by mouth daily with breakfast.   . SAFETY-LOK 3CC SYR 22GX1.5" 22G X 1-1/2" 3 ML MISC USE TO INJECT 1 ML OF TESTOSTERONE EVERY 14 DAYS  . triamcinolone cream (KENALOG) 0.1 % Apply 1 application topically daily as needed (for rash).   No facility-administered encounter medications on file as of 06/04/2020.     Objective:   Goals Addressed            This Visit's Progress   . Pharmacy Care Plan       Current Barriers:  . Chronic Disease Management support, education, and care coordination needs related to HTN  Pharmacist Clinical Goal(s):  Marland Kitchen Over the next 30 days, patient will continue checking blood pressure and reporting blood pressure readings.   Interventions: . Discussed reduction in sodium intake.  Marland Kitchen Collaboration with provider re: medication management  Patient Self Care Activities:  . Patient verbalizes understanding of plan to continuing blood pressure regimen.  Please see past updates related to this goal by clicking on the "Past Updates" button in the selected goal          Hypertension   Office blood pressures are  BP Readings from Last 3 Encounters:  05/22/20 (!) 175/69  05/07/20 (!) 142/78  03/21/20 126/80   Patient has failed these meds in  the past: none   Patient checks BP at home infrequently   Patient home BP readings are ranging: unknown  Patient's wife reports that BP cuff did not seem to be on properly in oncology, which is why it was elevated.   Patient is controlled on:  - amlodipine 5mg , 1 tablet once daily - in AM  Discussed the importance of continued blood pressure monitoring at home  Plan:  Follow up with CPA in 1 month for BP assessment.   Hypothyroidism   Patient has  failed these meds in past: none Patient is currently controlled on the following medications:  levothyroxine 50 mcg, 1 tab once daily.  Recent TSH:  Lab Results  Component Value Date   TSH 5.83 (H) 04/02/2020    We discussed:  consistent administration of levothyroxine in regards to meals and other medications  Plan Continue current medications.   Lower extremity edema   Patient has failed these meds in past: none Patient is currently controlled on the following medications: currently not taking any medications. Has furosemide on hand, but has not been taking.  We discussed:  sodium intake reduction. Patient has compression stockings, but states swelling is controlled and has no need for them currently.  Plan  Continue diet modifications.  Mood    Patient has failed these meds in past: none Patient is currently controlled on the following medications:  escitalopram 10mg , 1 tab daily  Plan  Continue current medications   Insomnia   Patient has failed these meds in past: none Patient is currently controlled on the following medications:   Melatonin 3mg  as needed and Trazodone 100mg  every night  Patient has been uncomfortable and is requesting to be turned once or twice a night.  Plan  Continue current medications.  Pain related to renal carcinoma   Patient has failed these meds in past: none Patient is currently controlled on the following medications:Tylenol (APAP) 500mg  as needed and Tramadol 50mg  as needed.   Fentanyl  And tramadol on days with breakthrough pain    We discussed:  increased risk of CNS and respiration depression of Tramadol and Trazodone. Patient/ spouse mentions taking Tylenol first for any pain and will take a dose of Tramadol for days when patient is moved more often.   Plan Continue current medications.     Wheezing   Patient has failed these meds in past: none Patient is currently controlled on the  following medications: albuterol HFA inhaler prn wheezing  Plan Continue current medications.  Vaccines   Reviewed and discussed patient's vaccination history.    Immunization History  Administered Date(s) Administered  . Fluad Quad(high Dose 65+) 05/07/2020  . Influenza Split 05/27/2011, 05/31/2012  . Influenza Whole 05/14/2009, 05/21/2010  . Influenza,inj,Quad PF,6+ Mos 04/13/2013, 06/01/2014, 05/23/2015, 06/18/2016, 04/07/2017, 04/14/2018, 04/15/2019  . PFIZER SARS-COV-2 Vaccination 11/14/2019, 12/06/2019  . Pneumococcal Polysaccharide-23 04/16/2004  . Td 11/12/2009    Plan  Plan to reassess at follow up appointment.   Medication Management   Pt [facility] medications Uses pill box? Yes Pt endorses 90% compliance  We discussed: Discussed benefits of medication synchronization, packaging and delivery as well as enhanced pharmacist oversight with [facility].  Plan  Continue current management.    Follow up: 3 month phone visit 1 month CPA BP assessment   Jeni Salles, PharmD Clinical Pharmacist [place] [place] [phone]

## 2020-06-06 ENCOUNTER — Other Ambulatory Visit: Payer: Self-pay | Admitting: Family Medicine

## 2020-06-06 MED ORDER — FENTANYL 25 MCG/HR TD PT72
1.0000 | MEDICATED_PATCH | TRANSDERMAL | 0 refills | Status: DC
Start: 2020-06-06 — End: 2020-07-09

## 2020-06-06 NOTE — Telephone Encounter (Signed)
Pts spouse is calling in stating that pt is out of Rx fentanyl ([place]) [facility] [address]

## 2020-06-08 NOTE — Patient Instructions (Signed)
Visit Information  Goals Addressed            This Visit's Progress   . Pharmacy Care Plan       Current Barriers:  . Chronic Disease Management support, education, and care coordination needs related to HTN  Pharmacist Clinical Goal(s):  Marland Kitchen Over the next 30 days, patient will continue checking blood pressure and reporting blood pressure readings.   Interventions: . Discussed reduction in sodium intake.  Marland Kitchen Collaboration with provider re: medication management  Patient Self Care Activities:  . Patient verbalizes understanding of plan to continuing blood pressure regimen.  Please see past updates related to this goal by clicking on the "Past Updates" button in the selected goal         The patient verbalized understanding of instructions provided today and declined a print copy of patient instruction materials.   Telephone follow up appointment with pharmacy team member scheduled for: 3 months

## 2020-06-12 ENCOUNTER — Other Ambulatory Visit: Payer: Self-pay | Admitting: Oncology

## 2020-06-13 DIAGNOSIS — R32 Unspecified urinary incontinence: Secondary | ICD-10-CM | POA: Diagnosis not present

## 2020-06-19 ENCOUNTER — Other Ambulatory Visit: Payer: Self-pay | Admitting: Family Medicine

## 2020-06-19 NOTE — Telephone Encounter (Signed)
Please see message and advise.  Thank you. Last OV 05/07/20 Last fill 03/23/20  #90/0

## 2020-06-19 NOTE — Telephone Encounter (Signed)
Refill for 6 months. 

## 2020-06-20 ENCOUNTER — Other Ambulatory Visit: Payer: Self-pay

## 2020-06-21 NOTE — Telephone Encounter (Signed)
Please see message and advise.  Thank you. ° °

## 2020-06-21 NOTE — Telephone Encounter (Signed)
I don't have authorization to refill medication. Please advise.

## 2020-06-22 NOTE — Telephone Encounter (Signed)
Refill for one year 

## 2020-06-26 ENCOUNTER — Other Ambulatory Visit: Payer: Self-pay

## 2020-06-26 MED ORDER — ESCITALOPRAM OXALATE 10 MG PO TABS
10.0000 mg | ORAL_TABLET | Freq: Every morning | ORAL | 1 refills | Status: DC
Start: 2020-06-26 — End: 2023-01-31

## 2020-07-09 ENCOUNTER — Other Ambulatory Visit: Payer: Self-pay | Admitting: Family Medicine

## 2020-07-09 MED ORDER — FENTANYL 25 MCG/HR TD PT72
1.0000 | MEDICATED_PATCH | TRANSDERMAL | 0 refills | Status: DC
Start: 2020-07-09 — End: 2020-08-06

## 2020-07-09 NOTE — Telephone Encounter (Signed)
Patient needs a refill on Fentanyl patches.  Will need a refill by Thursday sent to [facility] on [address].

## 2020-07-17 DIAGNOSIS — R32 Unspecified urinary incontinence: Secondary | ICD-10-CM | POA: Diagnosis not present

## 2020-07-25 ENCOUNTER — Telehealth: Payer: Self-pay | Admitting: Pharmacist

## 2020-07-25 NOTE — Chronic Care Management (AMB) (Signed)
Chronic Care Management Pharmacy Assistant   Name: Austin [name]  MRN: [id number] DOB: 06-09-1956  Reason for Encounter: Disease State  Patient Questions:  1.  Have you seen any other providers since your last visit? Yes, 05-22-20 Wyatt Portela, MD Oncology  2.  Any changes in your medicines or health? No   PCP : [name], MD  Allergies:   Allergies  Allergen Reactions  . Ace Inhibitors Other (See Comments)    Solitary kidney, experienced severe hyperkalemia on lisinopril (despite combined with HCTZ, no additional KCl)  . Angiotensin Receptor Blockers Other (See Comments)    See ACEi  . Penicillins Rash and Other (See Comments)    Has patient had a PCN reaction causing immediate rash, facial/tongue/throat swelling, SOB or lightheadedness with hypotension: YES Has patient had a PCN reaction causing severe rash involving mucus membranes or skin necrosis: No Has patient had a PCN reaction that required hospitalization No Has patient had a PCN reaction occurring within the last 10 years: Yes  If all of the above answers are "NO", then may proceed with Cephalosporin use.    Medications: Outpatient Encounter Medications as of 07/25/2020  Medication Sig  . acetaminophen (TYLENOL) 500 MG tablet Take 1 tablet (500 mg total) by mouth every 6 (six) hours as needed for mild pain, moderate pain, fever or headache.  Marland Kitchen amLODipine (NORVASC) 10 MG tablet Take 1 tablet (10 mg total) by mouth daily. (Patient taking differently: Take 5 mg by mouth daily. )  . BD DISP NEEDLES 18G X 1-1/2" MISC USE TO INJECT TESTOSTERONE INTO THE MUSCLE AS DIRECTED EVERY 14 DAYS  . escitalopram (LEXAPRO) 10 MG tablet Take 1 tablet (10 mg total) by mouth every morning.  . fentaNYL (DURAGESIC) 25 MCG/HR Place 1 patch onto the skin every 3 (three) days.  . hydrocortisone (ANUSOL-HC) 25 MG suppository Place 1 suppository (25 mg total) rectally at bedtime.  Marland Kitchen ketoconazole (NIZORAL) 2 % cream APPLY AS  NEEDED TO RASH (Patient taking differently: Apply 1 application topically daily as needed for irritation. )  . levothyroxine (SYNTHROID) 50 MCG tablet Take 1 tablet (50 mcg total) by mouth daily.  . Multiple Vitamin (MULTIVITAMIN WITH MINERALS) TABS tablet Take 1 tablet by mouth daily with breakfast.   . SAFETY-LOK 3CC SYR 22GX1.5" 22G X 1-1/2" 3 ML MISC USE TO INJECT 1 ML OF TESTOSTERONE EVERY 14 DAYS  . testosterone enanthate (DELATESTRYL) 200 MG/ML injection INJECT 1 ML INTRAMUSCULARLY  EVERY TWO WEEKS  . traMADol (ULTRAM) 50 MG tablet TAKE 1 TABLET BY MOUTH EVERY 6 HOURS AS NEEDED  . traZODone (DESYREL) 100 MG tablet TAKE 1 TABLET BY MOUTH AT BEDTIME  . triamcinolone cream (KENALOG) 0.1 % Apply 1 application topically daily as needed (for rash).   No facility-administered encounter medications on file as of 07/25/2020.    Current Diagnosis: Patient Active Problem List   Diagnosis Date Noted  . Rectal discharge 03/23/2020  . Other constipation 03/23/2020  . Hyperkalemia 01/13/2020  . 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 Dehydration 10/18/2019  . Sepsis due to pneumonia ([place]) 10/18/2019  . CAP (community acquired pneumonia) 10/17/2019  . Malignant neoplasm of left upper extremity ([place]) 10/14/2019  . Hypothyroid 07/14/2018  . Colostomy dysfunction ([place]) 06/16/2017  . 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 AKI (acute kidney injury) ([place])   . Colostomy in place  05/30/2017  . Blood loss anemia 05/29/2017  . 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 Perforation  of sigmoid colon due to diverticulitis s/p colostomy 9/18 09/27/2016  . Fatigue 05/23/2015  . Hyperlipidemia 10/17/2013  . Renal carcinoma metastatic with carcinomatosis 11/09/2012  . Type 2 diabetes mellitus ([place]) 03/31/2012  . Anemia associated with acute blood loss 02/28/2012  . Generalized abdominal pain 02/27/2012  . Low testosterone 06/21/2010  . WEAKNESS 04/03/2010  . FACIAL WEAKNESS  04/03/2010  . EDEMA 01/15/2009  . Pituitary tumor 01/15/2009  . Essential hypertension 10/16/2008  . Neurogenic bladder 10/16/2008  . OTHER SEBORRHEIC DERMATITIS 10/16/2008  . COLONIC POLYPS, HX OF 10/16/2008    Goals Addressed   None    Reviewed chart prior to disease state call. Spoke with patient regarding BP  Recent Office Vitals: BP Readings from Last 3 Encounters:  05/22/20 (!) 175/69  05/07/20 (!) 142/78  03/21/20 126/80   Pulse Readings from Last 3 Encounters:  05/22/20 75  05/07/20 68  03/21/20 74    Wt Readings from Last 3 Encounters:  03/21/20 180 lb (81.6 kg)  01/13/20 180 lb (81.6 kg)  10/17/19 180 lb (81.6 kg)     Kidney Function Lab Results  Component Value Date/Time   CREATININE 1.34 (H) 05/22/2020 09:59 AM   CREATININE 1.12 04/04/2020 10:33 AM   CREATININE 1.29 (H) 03/23/2020 10:18 AM   CREATININE 1.3 03/13/2017 09:22 AM   CREATININE 1.5 (H) 11/11/2016 09:13 AM   GFR 63.73 03/21/2020 03:28 PM   GFRNONAA 55 (L) 05/22/2020 09:59 AM   GFRNONAA 58 (L) 03/23/2020 10:18 AM   GFRAA 67 03/23/2020 10:18 AM    BMP Latest Ref Rng & Units 05/22/2020 04/04/2020 03/23/2020  Glucose 70 - 99 mg/dL 112(H) 97 87  BUN 8 - 23 mg/dL 38(H) 26(H) 31(H)  Creatinine 0.61 - 1.24 mg/dL 1.34(H) 1.12 1.29(H)  BUN/Creat Ratio 6 - 22 (calc) - 23(H) 24(H)  Sodium 135 - 145 mmol/L 137 136 131(L)  Potassium 3.5 - 5.1 mmol/L 4.6 4.9 5.7(H)  Chloride 98 - 111 mmol/L 107 106 100  CO2 22 - 32 mmol/L 23 22 21   Calcium 8.9 - 10.3 mg/dL 9.7 9.2 9.3    . Current antihypertensive regimen:  o Amlodipine 5 mg at breakfast . How often are you checking your Blood Pressure? 1-2x per week . Current home BP readings: 143/76 . What recent interventions/DTPs have been made by any provider to improve Blood Pressure control since last CPP Visit: None . Any recent hospitalizations or ED visits since last visit with CPP? No . What diet changes have been made to improve Blood Pressure Control?   o None . What exercise is being done to improve your Blood Pressure Control?  o None  Adherence Review: Is the patient currently on ACE/ARB medication? No Does the patient have >5 day gap between last estimated fill dates? No  Follow-Up:  Pharmacist Review   Maia Breslow, [place] Assistant [phone]

## 2020-07-29 ENCOUNTER — Other Ambulatory Visit: Payer: Self-pay | Admitting: Family Medicine

## 2020-07-31 ENCOUNTER — Telehealth: Payer: Self-pay | Admitting: Pharmacist

## 2020-07-31 NOTE — Progress Notes (Signed)
Chronic Care Management Pharmacy Assistant   Name: RENNE [name]  MRN: [id number] DOB: 12-Dec-1955  Reason for Encounter: Medication Adherence Call   PCP : [name], MD  Allergies:   Allergies  Allergen Reactions  . Ace Inhibitors Other (See Comments)    Solitary kidney, experienced severe hyperkalemia on lisinopril (despite combined with HCTZ, no additional KCl)  . Angiotensin Receptor Blockers Other (See Comments)    See ACEi  . Penicillins Rash and Other (See Comments)    Has patient had a PCN reaction causing immediate rash, facial/tongue/throat swelling, SOB or lightheadedness with hypotension: YES Has patient had a PCN reaction causing severe rash involving mucus membranes or skin necrosis: No Has patient had a PCN reaction that required hospitalization No Has patient had a PCN reaction occurring within the last 10 years: Yes  If all of the above answers are "NO", then may proceed with Cephalosporin use.    Medications: Outpatient Encounter Medications as of 07/31/2020  Medication Sig  . acetaminophen (TYLENOL) 500 MG tablet Take 1 tablet (500 mg total) by mouth every 6 (six) hours as needed for mild pain, moderate pain, fever or headache.  Marland Kitchen amLODipine (NORVASC) 10 MG tablet Take 1 tablet (10 mg total) by mouth daily. (Patient taking differently: Take 5 mg by mouth daily. )  . BD DISP NEEDLES 18G X 1-1/2" MISC USE TO INJECT TESTOSTERONE INTO THE MUSCLE AS DIRECTED EVERY 14 DAYS  . escitalopram (LEXAPRO) 10 MG tablet Take 1 tablet (10 mg total) by mouth every morning.  . fentaNYL (DURAGESIC) 25 MCG/HR Place 1 patch onto the skin every 3 (three) days.  . hydrocortisone (ANUSOL-HC) 25 MG suppository Place 1 suppository (25 mg total) rectally at bedtime.  Marland Kitchen ketoconazole (NIZORAL) 2 % cream APPLY AS NEEDED TO RASH (Patient taking differently: Apply 1 application topically daily as needed for irritation. )  . levothyroxine (SYNTHROID) 50 MCG tablet Take 1 tablet (50  mcg total) by mouth daily.  . Multiple Vitamin (MULTIVITAMIN WITH MINERALS) TABS tablet Take 1 tablet by mouth daily with breakfast.   . SAFETY-LOK 3CC SYR 22GX1.5" 22G X 1-1/2" 3 ML MISC USE TO INJECT 1 ML OF TESTOSTERONE EVERY 14 DAYS  . testosterone enanthate (DELATESTRYL) 200 MG/ML injection INJECT 1 ML INTRAMUSCULARLY  EVERY TWO WEEKS  . traMADol (ULTRAM) 50 MG tablet TAKE 1 TABLET BY MOUTH EVERY 6 HOURS AS NEEDED  . traZODone (DESYREL) 100 MG tablet TAKE 1 TABLET BY MOUTH AT BEDTIME  . triamcinolone cream (KENALOG) 0.1 % Apply 1 application topically daily as needed (for rash).   No facility-administered encounter medications on file as of 07/31/2020.    Current Diagnosis: Patient Active Problem List   Diagnosis Date Noted  . Rectal discharge 03/23/2020  . Other constipation 03/23/2020  . Hyperkalemia 01/13/2020  . 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 Dehydration 10/18/2019  . Sepsis due to pneumonia ([place]) 10/18/2019  . CAP (community acquired pneumonia) 10/17/2019  . Malignant neoplasm of left upper extremity ([place]) 10/14/2019  . Hypothyroid 07/14/2018  . Colostomy dysfunction ([place]) 06/16/2017  . 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 AKI (acute kidney injury) ([place])   . Colostomy in place  05/30/2017  . Blood loss anemia 05/29/2017  . 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 Perforation of sigmoid colon due to diverticulitis s/p colostomy 9/18 09/27/2016  . Fatigue 05/23/2015  . Hyperlipidemia 10/17/2013  . Renal carcinoma metastatic with carcinomatosis 11/09/2012  . Type 2 diabetes mellitus ([place])  03/31/2012  . Anemia associated with acute blood loss 02/28/2012  . Generalized abdominal pain 02/27/2012  . Low testosterone 06/21/2010  . WEAKNESS 04/03/2010  . FACIAL WEAKNESS 04/03/2010  . EDEMA 01/15/2009  . Pituitary tumor 01/15/2009  . Essential hypertension 10/16/2008  . Neurogenic bladder 10/16/2008  . OTHER SEBORRHEIC DERMATITIS  10/16/2008  . COLONIC POLYPS, HX OF 10/16/2008    Goals Addressed   None     Follow-Up:  Pharmacist Review   The patient was on the medication adherence list for Lisinopril -HCTZ 20-25mg . But the patient is no longer taking the medication due to him having a bad reaction which caused the patient to be hospitalized for a few days After speaking with the patients  wife she confirmed that with the patients current medical conditions he is unable to take that class of medications.   Rosendo Gros, [facility]  Practice Team Manager/ CPA (Clinical Pharmacist Assistant) [phone]

## 2020-08-06 ENCOUNTER — Ambulatory Visit (INDEPENDENT_AMBULATORY_CARE_PROVIDER_SITE_OTHER): Payer: PPO | Admitting: Family Medicine

## 2020-08-06 ENCOUNTER — Other Ambulatory Visit: Payer: Self-pay

## 2020-08-06 ENCOUNTER — Encounter: Payer: Self-pay | Admitting: Family Medicine

## 2020-08-06 VITALS — BP 148/82 | HR 80 | Ht 72.0 in

## 2020-08-06 DIAGNOSIS — D649 Anemia, unspecified: Secondary | ICD-10-CM

## 2020-08-06 DIAGNOSIS — I10 Essential (primary) hypertension: Secondary | ICD-10-CM | POA: Diagnosis not present

## 2020-08-06 DIAGNOSIS — E039 Hypothyroidism, unspecified: Secondary | ICD-10-CM | POA: Diagnosis not present

## 2020-08-06 DIAGNOSIS — R5383 Other fatigue: Secondary | ICD-10-CM | POA: Diagnosis not present

## 2020-08-06 LAB — CBC WITH DIFFERENTIAL/PLATELET
Basophils Absolute: 0.1 10*3/uL (ref 0.0–0.1)
Basophils Relative: 1.1 % (ref 0.0–3.0)
Eosinophils Absolute: 0.4 10*3/uL (ref 0.0–0.7)
Eosinophils Relative: 4 % (ref 0.0–5.0)
HCT: 32.6 % — ABNORMAL LOW (ref 39.0–52.0)
Hemoglobin: 10.5 g/dL — ABNORMAL LOW (ref 13.0–17.0)
Lymphocytes Relative: 9.3 % — ABNORMAL LOW (ref 12.0–46.0)
Lymphs Abs: 1 10*3/uL (ref 0.7–4.0)
MCHC: 32.2 g/dL (ref 30.0–36.0)
MCV: 83 fl (ref 78.0–100.0)
Monocytes Absolute: 1 10*3/uL (ref 0.1–1.0)
Monocytes Relative: 9.5 % (ref 3.0–12.0)
Neutro Abs: 8.2 10*3/uL — ABNORMAL HIGH (ref 1.4–7.7)
Neutrophils Relative %: 76.1 % (ref 43.0–77.0)
Platelets: 237 10*3/uL (ref 150.0–400.0)
RBC: 3.92 Mil/uL — ABNORMAL LOW (ref 4.22–5.81)
RDW: 17.4 % — ABNORMAL HIGH (ref 11.5–15.5)
WBC: 10.8 10*3/uL — ABNORMAL HIGH (ref 4.0–10.5)

## 2020-08-06 LAB — BASIC METABOLIC PANEL
BUN: 34 mg/dL — ABNORMAL HIGH (ref 6–23)
CO2: 22 mEq/L (ref 19–32)
Calcium: 8.8 mg/dL (ref 8.4–10.5)
Chloride: 105 mEq/L (ref 96–112)
Creatinine, Ser: 1.42 mg/dL (ref 0.40–1.50)
GFR: 51.72 mL/min — ABNORMAL LOW (ref >60.00)
Glucose, Bld: 165 mg/dL — ABNORMAL HIGH (ref 70–99)
Potassium: 5 mEq/L (ref 3.5–5.1)
Sodium: 136 mEq/L (ref 135–145)

## 2020-08-06 LAB — TSH: TSH: 5.55 u[IU]/mL — ABNORMAL HIGH (ref 0.35–4.50)

## 2020-08-06 MED ORDER — FENTANYL 25 MCG/HR TD PT72
1.0000 | MEDICATED_PATCH | TRANSDERMAL | 0 refills | Status: DC
Start: 2020-08-06 — End: 2020-09-07

## 2020-08-06 NOTE — Progress Notes (Signed)
Established Patient Office Visit  Subjective:  Patient ID: Austin [name], male    DOB: Jan 13, 1956  Age: 64 y.o. MRN: [id number]  CC:  Chief Complaint  Patient presents with  . Follow-up    HPI Austin [name] presents for medical follow-up.  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  He has had progressive weakness.  He had progressive arm stiffness which is made it difficult for him to work his wheelchair controls.  He has declined further physical therapy at this time.  His wife tries to do stretching exercises several times daily.  They would like to check his electrolytes.  He has hypertension currently treated with amlodipine 5 mg 1/2 tablet daily.  He had increased edema with higher doses.  He has chronic pain related to his metastatic cancer currently on Duragesic 25 mcg patch.  Still has some low back pain at times and upper extremity pain in his arm and side pain but overall this is fairly well controlled with current dosage.  Past Medical History:  Diagnosis Date  . Brainstem stroke ([place]) 10/16/2008   Qualifier: Diagnosis of  By: [name] LPN, [name]    . C. difficile colitis 02/08/2017  . Cerebrovascular accident ([place]) 1994   hx of brainstem stroke, residual diminished lung capacity  . Chronic kidney disease    Left Renal Mass  . 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 Diverticulitis   . Duodenal ulcer   . GIB (gastrointestinal bleeding) 05/28/2017  . Hx of colonic polyps   . Hypertension    has been off BP meds since GI bleed was thought to have been caused by previously prescribed lisinopril   . Hypogonadism   . Intra-abdominal abscess ([place]) 01/23/2017  . Iron deficiency anemia   . 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  Neuromuscular disorder ([place])    quadraplegic  . Open abdominal incision with drainage 01/26/2017  . Pituitary adenoma ([place])   . 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 Pneumonia   . PONV (postoperative nausea and vomiting)    vomited after colostomy placement   . Pre-diabetes    last a1c 5.6   . 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[place])   . Urinary incontinence   . Venous stasis    edema    Past Surgical History:  Procedure Laterality Date  . CHOLECYSTECTOMY    . COLON RESECTION N/A 05/04/2017   Procedure: LAPAROSCOPIC SIGMOID COLON RESECTION WITH END COLSOTOMY ERAS PATHWAY;  Surgeon: Alphonsa Overall, MD;  Location: [facility];  Service: General;  Laterality: N/A;  . COLONOSCOPY  02/27/2012   Procedure: COLONOSCOPY;  Surgeon: Jerene Bears, MD;  Location: [facility];  Service: Gastroenterology;  Laterality: N/A;  . COLONOSCOPY N/A 05/31/2017   Procedure: COLONOSCOPY;  Surgeon: Milus Banister, MD;  Location: [facility];  Service: Endoscopy;  Laterality: N/A;  . COLOSTOMY TAKEDOWN N/A 08/20/2017   Procedure: LAPAROSCOPIC ASSISTED REVISION END COLOSTOMY AND ENTEROLYSIS OF ADHESIONS;  Surgeon: Alphonsa Overall, MD;  Location: [facility];  Service: General;  Laterality: N/A;  ERAS PATHWAY  . ESOPHAGOGASTRODUODENOSCOPY N/A 05/31/2017   Procedure: ESOPHAGOGASTRODUODENOSCOPY (EGD);  Surgeon: Milus Banister, MD;  Location: [facility];  Service: Endoscopy;  Laterality: N/A;  . gamma knife radiation surgery  05/2010   for pituitary  . IR RADIOLOGIST  EVAL & MGMT  02/12/2017  . IR RADIOLOGIST EVAL & MGMT  03/17/2017  . IR RADIOLOGIST EVAL & MGMT  04/14/2017  . IR RADIOLOGIST EVAL & MGMT  02/05/2017  . PITUITARY SURGERY  2007   nose approach  . ROBOT ASSISTED LAPAROSCOPIC NEPHRECTOMY  04/23/2012   Procedure: ROBOTIC ASSISTED LAPAROSCOPIC NEPHRECTOMY;  Surgeon: Alexis Frock, MD;  Location: [facility];  Service: Urology;  Laterality: Left;  radical  . stomach peg  02/1993   removed 5-6 years later   . TRACHEOSTOMY TUBE PLACEMENT  02/1993   removed 04/1993  . UMBILICAL HERNIA REPAIR  04/23/2012   Procedure: HERNIA REPAIR UMBILICAL ADULT;  Surgeon: Alexis Frock, MD;  Location: [facility];  Service: Urology;;  . [name]  . vocal cord surgery     injected with collagen, then fat from stomach to improve speech s/p stroke    Family History  Problem Relation Age of Onset  . Alcohol abuse Father   . Throat cancer Father   . Esophageal cancer Father 21  . Arthritis Mother   . Hyperlipidemia Mother   . Uterine cancer Mother 46  . Colon cancer Brother   . Arthritis Maternal Grandmother   . Hypertension Brother   . Stroke Paternal Grandmother     Social History   Socioeconomic History  . Marital status: Married    Spouse name: Not on file  . Number of children: 3  . Years of education: Not on file  . Highest education level: Not on file  Occupational History  . Occupation: disabled    Fish farm manager: UNEMPLOYED    Employer: [employer]    Comment: Retired  . Occupation: Psychiatrist    Comment: retired  Tobacco Use  . Smoking status: Former Smoker    Packs/day: 0.50    Years: 23.00    Pack years: 11.50    Types: Cigarettes    Quit date: 09/24/1992    Years since quitting: 27.8  . Smokeless tobacco: Never Used  Vaping Use  . Vaping Use: Never used  Substance and Sexual Activity  . Alcohol use: No    Comment: rare  . Drug use: No  . Sexual activity: Not on file  Other Topics Concern  . Not on file  Social History Narrative   Retired, disabled 1994 Quadreplegic      10/13/2018:   Originally from [place].   Lives with wife on one level apartment. Wife Peter Congo is main caregiver.   Has 2 daughters, 1 son, 6 grandchildren.   Loves spending time with grandchildren, some of whom are local and visit frequently. Some family still in [place] with whom he face-times.   Attends church, active with church community with Peter Congo   Enjoys working on the  computer, going outside in his motorized wheelchair   Social Determinants of Health   Financial Resource Strain: Low Risk   . Difficulty of Paying Living Expenses: Not hard at all  Food Insecurity: No Food Insecurity  . Worried About Charity fundraiser in the Last Year: Never true  . Ran Out of Food in the Last Year: Never true  Transportation Needs: No Transportation Needs  . Lack of Transportation (Medical): No  . Lack of Transportation (Non-Medical): No  Physical Activity: Inactive  . Days of Exercise per Week: 0 days  . Minutes of Exercise per Session: 0 min  Stress: No Stress Concern Present  . Feeling of Stress : Not at all  Social Connections:  Moderately Integrated  . Frequency of Communication with Friends and Family: More than three times a week  . Frequency of Social Gatherings with Friends and Family: Once a week  . Attends Religious Services: More than 4 times per year  . Active Member of Clubs or Organizations: No  . Attends Archivist Meetings: Never  . Marital Status: Married  Human resources officer Violence: Not At Risk  . Fear of Current or Ex-Partner: No  . Emotionally Abused: No  . Physically Abused: No  . Sexually Abused: No    Outpatient Medications Prior to Visit  Medication Sig Dispense Refill  . acetaminophen (TYLENOL) 500 MG tablet Take 1 tablet (500 mg total) by mouth every 6 (six) hours as needed for mild pain, moderate pain, fever or headache. 30 tablet 0  . amLODipine (NORVASC) 5 MG tablet Take 5 mg by mouth daily.    . BD DISP NEEDLES 18G X 1-1/2" MISC USE TO INJECT TESTOSTERONE INTO THE MUSCLE AS DIRECTED EVERY 14 DAYS 10 each 0  . escitalopram (LEXAPRO) 10 MG tablet Take 1 tablet (10 mg total) by mouth every morning. 90 tablet 1  . hydrocortisone (ANUSOL-HC) 25 MG suppository Place 1 suppository (25 mg total) rectally at bedtime. 7 suppository 0  . ketoconazole (NIZORAL) 2 % cream APPLY AS NEEDED TO RASH (Patient taking differently: Apply 1  application topically daily as needed for irritation. ) 60 g 0  . levothyroxine (SYNTHROID) 50 MCG tablet Take 1 tablet (50 mcg total) by mouth daily. 90 tablet 3  . Multiple Vitamin (MULTIVITAMIN WITH MINERALS) TABS tablet Take 1 tablet by mouth daily with breakfast.     . SAFETY-LOK 3CC SYR 22GX1.5" 22G X 1-1/2" 3 ML MISC USE TO INJECT 1 ML OF TESTOSTERONE EVERY 14 DAYS 10 each 0  . testosterone enanthate (DELATESTRYL) 200 MG/ML injection INJECT 1 ML INTRAMUSCULARLY  EVERY TWO WEEKS 10 mL 0  . traMADol (ULTRAM) 50 MG tablet TAKE 1 TABLET BY MOUTH EVERY 6 HOURS AS NEEDED 60 tablet 0  . traZODone (DESYREL) 100 MG tablet TAKE 1 TABLET BY MOUTH AT BEDTIME 90 tablet 0  . triamcinolone cream (KENALOG) 0.1 % Apply 1 application topically daily as needed (for rash).    Marland Kitchen amLODipine (NORVASC) 10 MG tablet Take 1 tablet (10 mg total) by mouth daily. (Patient taking differently: Take 5 mg by mouth daily. ) 90 tablet 3  . fentaNYL (DURAGESIC) 25 MCG/HR Place 1 patch onto the skin every 3 (three) days. 10 patch 0   No facility-administered medications prior to visit.    Allergies  Allergen Reactions  . Ace Inhibitors Other (See Comments)    Solitary kidney, experienced severe hyperkalemia on lisinopril (despite combined with HCTZ, no additional KCl)  . Angiotensin Receptor Blockers Other (See Comments)    See ACEi  . Penicillins Rash and Other (See Comments)    Has patient had a PCN reaction causing immediate rash, facial/tongue/throat swelling, SOB or lightheadedness with hypotension: YES Has patient had a PCN reaction causing severe rash involving mucus membranes or skin necrosis: No Has patient had a PCN reaction that required hospitalization No Has patient had a PCN reaction occurring within the last 10 years: Yes  If all of the above answers are "NO", then may proceed with Cephalosporin use.    ROS Review of Systems  Constitutional: Positive for fatigue. Negative for chills and fever.   Respiratory: Negative for shortness of breath.   Cardiovascular: Positive for leg swelling. Negative for chest  pain.  Gastrointestinal: Negative for abdominal pain, nausea and vomiting.  Psychiatric/Behavioral: Negative for confusion.      Objective:    Physical Exam Vitals reviewed.  Cardiovascular:     Rate and Rhythm: Normal rate and regular rhythm.  Pulmonary:     Effort: Pulmonary effort is normal.     Breath sounds: Normal breath sounds.  Musculoskeletal:     Right lower leg: Edema present.     Left lower leg: Edema present.     Comments: He has 1+ edema ankles and lower legs bilaterally     BP (!) 148/82   Pulse 80   Ht 6' (1.829 m)   SpO2 98%   BMI 24.41 kg/m  Wt Readings from Last 3 Encounters:  03/21/20 180 lb (81.6 kg)  01/13/20 180 lb (81.6 kg)  10/17/19 180 lb (81.6 kg)     Health Maintenance Due  Topic Date Due  . Hepatitis C Screening  Never done  . OPHTHALMOLOGY EXAM  02/23/2019  . HEMOGLOBIN A1C  04/15/2019  . PNA vac Low Risk Adult (1 of 2 - PCV13) 09/25/2019  . TETANUS/TDAP  11/13/2019  . COVID-19 Vaccine (3 - Pfizer risk 4-dose series) 01/03/2020    There are no preventive care reminders to display for this patient.  Lab Results  Component Value Date   TSH 5.83 (H) 04/02/2020   Lab Results  Component Value Date   WBC 10.9 (H) 05/22/2020   HGB 10.7 (L) 05/22/2020   HCT 34.4 (L) 05/22/2020   MCV 86.9 05/22/2020   PLT 220 05/22/2020   Lab Results  Component Value Date   NA 137 05/22/2020   K 4.6 05/22/2020   CHLORIDE 106 03/13/2017   CO2 23 05/22/2020   GLUCOSE 112 (H) 05/22/2020   BUN 38 (H) 05/22/2020   CREATININE 1.34 (H) 05/22/2020   BILITOT <0.2 (L) 05/22/2020   ALKPHOS 66 05/22/2020   AST 9 (L) 05/22/2020   ALT 11 05/22/2020   PROT 7.7 05/22/2020   ALBUMIN 3.0 (L) 05/22/2020   CALCIUM 9.7 05/22/2020   ANIONGAP 7 05/22/2020   EGFR 56 (L) 03/13/2017   GFR 63.73 03/21/2020   Lab Results  Component Value Date    CHOL 193 04/14/2018   Lab Results  Component Value Date   HDL 31.50 (L) 04/14/2018   Lab Results  Component Value Date   LDLCALC 72 12/22/2016   Lab Results  Component Value Date   TRIG 313.0 (H) 04/14/2018   Lab Results  Component Value Date   CHOLHDL 6 04/14/2018   Lab Results  Component Value Date   HGBA1C 5.9 (A) 10/13/2018      Assessment & Plan:   Problem List Items Addressed This Visit      Unprioritized   Essential hypertension - Primary   Relevant Medications   amLODipine (NORVASC) 5 MG tablet   Other Relevant Orders   Basic metabolic panel   Fatigue   Hypothyroid   Relevant Orders   TSH   Normocytic anemia   Relevant Orders   CBC with Differential/Platelet    -Check labs as above -We discussed possible further home physical therapy but they declined at this time -Refilled Duragesic patch prescription -We also mention potential things like referral to neurology for Botox for his upper extremity stiffness and spasticity but they declined at this time  Meds ordered this encounter  Medications  . fentaNYL (DURAGESIC) 25 MCG/HR    Sig: Place 1 patch onto the skin every 3 (  three) days.    Dispense:  10 patch    Refill:  0    Follow-up: Return in about 3 months (around 11/04/2020).    Carolann Littler, MD

## 2020-08-07 ENCOUNTER — Telehealth: Payer: Self-pay

## 2020-08-07 MED ORDER — LEVOTHYROXINE SODIUM 75 MCG PO TABS: 75.0000 ug | ORAL_TABLET | Freq: Every day | ORAL | 3 refills | Status: DC

## 2020-08-07 NOTE — Telephone Encounter (Signed)
-----   Message from [name], MD sent at 08/07/2020  8:01 AM EST ----- Hgb stable.  Electrolytes (sodium and potassium normal).  Thyroid still under-replaced.  Increase Levothyroxine to 75 mcg daily and will repeat TSH at 3 month follow up.

## 2020-08-07 NOTE — Telephone Encounter (Signed)
Patient aware of results and recommendations. °

## 2020-08-15 DIAGNOSIS — Z933 Colostomy status: Secondary | ICD-10-CM | POA: Diagnosis not present

## 2020-08-22 ENCOUNTER — Telehealth: Payer: Self-pay

## 2020-08-22 NOTE — Telephone Encounter (Signed)
Volunteer called patient on behalf of Palliative Care. Patient is doing well at this time.  

## 2020-09-03 ENCOUNTER — Other Ambulatory Visit: Payer: Self-pay

## 2020-09-03 ENCOUNTER — Ambulatory Visit: Payer: PPO | Admitting: Pharmacist

## 2020-09-03 DIAGNOSIS — I10 Essential (primary) hypertension: Secondary | ICD-10-CM

## 2020-09-03 DIAGNOSIS — E039 Hypothyroidism, unspecified: Secondary | ICD-10-CM

## 2020-09-03 NOTE — Chronic Care Management (AMB) (Signed)
Chronic Care Management   Follow Up Note 09/03/2020 Name: Austin [name] MRN: [id number] DOB: 27-Jun-1956  Referred by: [name], MD Reason for referral : Chronic Care Management (Pharmacist follow up)   Austin [name] is a 65 y.o. year old male who is a primary care patient of [name], [name], MD. The CCM team was consulted for assistance with chronic disease management and care coordination needs.    Office Visits: -08/06/20 Carolann Littler, MD: Patient presented for HTN follow up. Increased levothyroxine to 75 mcg daily.     Outpatient Encounter Medications as of 09/03/2020  Medication Sig  . acetaminophen (TYLENOL) 500 MG tablet Take 1 tablet (500 mg total) by mouth every 6 (six) hours as needed for mild pain, moderate pain, fever or headache.  Marland Kitchen amLODipine (NORVASC) 5 MG tablet Take 5 mg by mouth daily.  . BD DISP NEEDLES 18G X 1-1/2" MISC USE TO INJECT TESTOSTERONE INTO THE MUSCLE AS DIRECTED EVERY 14 DAYS  . escitalopram (LEXAPRO) 10 MG tablet Take 1 tablet (10 mg total) by mouth every morning.  . fentaNYL (DURAGESIC) 25 MCG/HR Place 1 patch onto the skin every 3 (three) days.  . hydrocortisone (ANUSOL-HC) 25 MG suppository Place 1 suppository (25 mg total) rectally at bedtime.  Marland Kitchen ketoconazole (NIZORAL) 2 % cream APPLY AS NEEDED TO RASH (Patient taking differently: Apply 1 application topically daily as needed for irritation. )  . levothyroxine (SYNTHROID) 75 MCG tablet Take 1 tablet (75 mcg total) by mouth daily.  . Multiple Vitamin (MULTIVITAMIN WITH MINERALS) TABS tablet Take 1 tablet by mouth daily with breakfast.   . SAFETY-LOK 3CC SYR 22GX1.5" 22G X 1-1/2" 3 ML MISC USE TO INJECT 1 ML OF TESTOSTERONE EVERY 14 DAYS  . testosterone enanthate (DELATESTRYL) 200 MG/ML injection INJECT 1 ML INTRAMUSCULARLY  EVERY TWO WEEKS  . traMADol (ULTRAM) 50 MG tablet TAKE 1 TABLET BY MOUTH EVERY 6 HOURS AS NEEDED  . traZODone (DESYREL) 100 MG tablet TAKE 1 TABLET BY MOUTH  AT BEDTIME  . triamcinolone cream (KENALOG) 0.1 % Apply 1 application topically daily as needed (for rash).   No facility-administered encounter medications on file as of 09/03/2020.    Patient is in pain all the time and doesn't say a lot but just that everything hurts.   Objective:   Goals Addressed            This Visit's Progress   . Pharmacy Care Plan       Current Barriers:  . Chronic Disease Management support, education, and care coordination needs related to HTN  Pharmacist Clinical Goal(s):  Marland Kitchen Over the next 120 days, patient will continue checking blood pressure and reporting blood pressure readings.   Interventions: . Discussed reduction in sodium intake.  Marland Kitchen Collaboration with provider re: medication management  Patient Self Care Activities:  . Patient verbalizes understanding of plan to continuing blood pressure regimen.  Please see past updates related to this goal by clicking on the "Past Updates" button in the selected goal          Hypertension   Office blood pressures are  BP Readings from Last 3 Encounters:  08/06/20 (!) 148/82  05/22/20 (!) 175/69  05/07/20 (!) 142/78   Patient has failed these meds in the past: none   Patient checks BP at home infrequently   Patient home BP readings are ranging: < 150/80  Patient is controlled on:  - amlodipine 5mg , 1 tablet once daily - in AM  Discussed the importance of continued blood pressure monitoring at home  Plan:  Continue current medication.  Hypothyroidism   Patient has failed these meds in past: none Patient is currently controlled on the following medications:  levothyroxine 75 mcg, 1 tab once daily.  Recent TSH:  Lab Results  Component Value Date   TSH 5.55 (H) 08/06/2020    We discussed: recent dose increase of levothyroxine  Plan Continue current medications.   Lower extremity edema   Patient has failed these meds in past: none Patient is currently controlled on the  following medications: currently not taking any medications. Has furosemide on hand, but has not been taking.  We discussed:  patient has not had any swelling lately - wife describes as non-existent   Plan  Continue diet modifications.  Mood  Patient has failed these meds in past: none Patient is currently controlled on the following medications:  escitalopram 10mg , 1 tab daily  We discussed: patient seems a bit more agitated lately but may be due to more pain, weather changes and being inside most of the day  Plan  Continue current medications   Insomnia   Patient has failed these meds in past: none Patient is currently controlled on the following medications:   Melatonin 3mg  as needed and Trazodone 100mg  every night  We discussed: patient has been sleeping a lot better lately as they switched to decaf after lunch; he rarely needs to be turned at night  Plan  Continue current medications.  Pain related to renal carcinoma   Patient has failed these meds in past: none Patient is currently controlled on the following medications:  Tylenol (APAP) 500mg  as needed   Tramadol 50mg  as needed  Fentanyl 25 mcg/hr patch every 3 days   We discussed:  patient's biggest complaint is constant pain and uses both Fentanyl patches and tramadol on most days  Plan Continue current medications.     Wheezing   Patient has failed these meds in past: none Patient is currently controlled on the following medications: albuterol HFA inhaler prn wheezing  Plan Continue current medications.  Vaccines   Reviewed and discussed patient's vaccination history.    Immunization History  Administered Date(s) Administered  . Fluad Quad(high Dose 65+) 05/07/2020  . Influenza Split 05/27/2011, 05/31/2012  . Influenza Whole 05/14/2009, 05/21/2010  . Influenza,inj,Quad PF,6+ Mos 04/13/2013, 06/01/2014, 05/23/2015, 06/18/2016, 04/07/2017, 04/14/2018, 04/15/2019  .  PFIZER(Purple Top)SARS-COV-2 Vaccination 11/14/2019, 12/06/2019  . Pneumococcal Polysaccharide-23 04/16/2004  . Td 11/12/2009    Plan  Plan to reassess at follow up appointment.   Medication Management   Pt uses [facility] for all medications Uses pill box? Yes Pt endorses 90% compliance  We discussed: Discussed benefits of medication synchronization, packaging and delivery as well as enhanced pharmacist oversight with [facility].  Plan  Continue current management.    Follow up: 4-5 month phone visit   Jeni Salles, PharmD Clinical Pharmacist [facility] [phone]

## 2020-09-07 ENCOUNTER — Telehealth: Payer: Self-pay | Admitting: Family Medicine

## 2020-09-07 MED ORDER — FENTANYL 25 MCG/HR TD PT72: MEDICATED_PATCH | TRANSDERMAL | 0 refills | Status: DC

## 2020-09-07 MED ORDER — FENTANYL 25 MCG/HR TD PT72
1.0000 | MEDICATED_PATCH | TRANSDERMAL | 0 refills | Status: DC
Start: 2020-09-07 — End: 2020-10-09

## 2020-09-07 NOTE — Telephone Encounter (Signed)
Last office visit and refills- 08/06/2020

## 2020-09-07 NOTE — Addendum Note (Signed)
Addended by: [name] on: 09/07/2020 12:16 PM   Modules accepted: Orders

## 2020-09-07 NOTE — Telephone Encounter (Signed)
Send in 3 months of refills.

## 2020-09-07 NOTE — Telephone Encounter (Signed)
Patient needs a refill on  fentaNYL (DURAGESIC) 25 MCG/HR Peter Congo is putting his last on eon the patient today.    [place], [place] - [address] Phone:  [phone]  Fax:  [phone]

## 2020-09-15 DIAGNOSIS — R32 Unspecified urinary incontinence: Secondary | ICD-10-CM | POA: Diagnosis not present

## 2020-09-18 ENCOUNTER — Other Ambulatory Visit: Payer: Self-pay

## 2020-09-18 ENCOUNTER — Inpatient Hospital Stay: Payer: PPO | Attending: Oncology

## 2020-09-18 ENCOUNTER — Other Ambulatory Visit: Payer: Self-pay | Admitting: Oncology

## 2020-09-18 ENCOUNTER — Inpatient Hospital Stay: Payer: PPO | Admitting: Oncology

## 2020-09-18 VITALS — BP 153/67 | HR 84 | Temp 98.7°F | Resp 18 | Ht 72.0 in

## 2020-09-18 DIAGNOSIS — Z905 Acquired absence of kidney: Secondary | ICD-10-CM | POA: Diagnosis not present

## 2020-09-18 DIAGNOSIS — C649 Malignant neoplasm of unspecified kidney, except renal pelvis: Secondary | ICD-10-CM | POA: Diagnosis not present

## 2020-09-18 DIAGNOSIS — Z923 Personal history of irradiation: Secondary | ICD-10-CM | POA: Diagnosis not present

## 2020-09-18 DIAGNOSIS — C7800 Secondary malignant neoplasm of unspecified lung: Secondary | ICD-10-CM | POA: Insufficient documentation

## 2020-09-18 DIAGNOSIS — C786 Secondary malignant neoplasm of retroperitoneum and peritoneum: Secondary | ICD-10-CM | POA: Insufficient documentation

## 2020-09-18 DIAGNOSIS — R5383 Other fatigue: Secondary | ICD-10-CM | POA: Diagnosis not present

## 2020-09-18 DIAGNOSIS — R52 Pain, unspecified: Secondary | ICD-10-CM | POA: Diagnosis not present

## 2020-09-18 DIAGNOSIS — C7951 Secondary malignant neoplasm of bone: Secondary | ICD-10-CM | POA: Diagnosis not present

## 2020-09-18 LAB — CBC WITH DIFFERENTIAL (CANCER CENTER ONLY)
Abs Immature Granulocytes: 0.07 10*3/uL (ref 0.00–0.07)
Basophils Absolute: 0.1 10*3/uL (ref 0.0–0.1)
Basophils Relative: 1 %
Eosinophils Absolute: 0.4 10*3/uL (ref 0.0–0.5)
Eosinophils Relative: 4 %
HCT: 30.5 % — ABNORMAL LOW (ref 39.0–52.0)
Hemoglobin: 9.5 g/dL — ABNORMAL LOW (ref 13.0–17.0)
Immature Granulocytes: 1 %
Lymphocytes Relative: 11 %
Lymphs Abs: 1.2 10*3/uL (ref 0.7–4.0)
MCH: 26.2 pg (ref 26.0–34.0)
MCHC: 31.1 g/dL (ref 30.0–36.0)
MCV: 84 fL (ref 80.0–100.0)
Monocytes Absolute: 1.2 10*3/uL — ABNORMAL HIGH (ref 0.1–1.0)
Monocytes Relative: 12 %
Neutro Abs: 7.7 10*3/uL (ref 1.7–7.7)
Neutrophils Relative %: 71 %
Platelet Count: 248 10*3/uL (ref 150–400)
RBC: 3.63 MIL/uL — ABNORMAL LOW (ref 4.22–5.81)
RDW: 17.1 % — ABNORMAL HIGH (ref 11.5–15.5)
WBC Count: 10.7 10*3/uL — ABNORMAL HIGH (ref 4.0–10.5)
nRBC: 0 % (ref 0.0–0.2)

## 2020-09-18 LAB — CMP (CANCER CENTER ONLY)
ALT: 11 U/L (ref 0–44)
AST: 12 U/L — ABNORMAL LOW (ref 15–41)
Albumin: 3.1 g/dL — ABNORMAL LOW (ref 3.5–5.0)
Alkaline Phosphatase: 56 U/L (ref 38–126)
Anion gap: 7 (ref 5–15)
BUN: 42 mg/dL — ABNORMAL HIGH (ref 8–23)
CO2: 20 mmol/L — ABNORMAL LOW (ref 22–32)
Calcium: 9.5 mg/dL (ref 8.9–10.3)
Chloride: 109 mmol/L (ref 98–111)
Creatinine: 1.29 mg/dL — ABNORMAL HIGH (ref 0.61–1.24)
GFR, Estimated: >60 mL/min (ref >60)
Glucose, Bld: 89 mg/dL (ref 70–99)
Potassium: 5.3 mmol/L — ABNORMAL HIGH (ref 3.5–5.1)
Sodium: 136 mmol/L (ref 135–145)
Total Bilirubin: <0.2 mg/dL — ABNORMAL LOW (ref 0.3–1.2)
Total Protein: 7.6 g/dL (ref 6.5–8.1)

## 2020-09-18 NOTE — Progress Notes (Signed)
Hematology and Oncology Follow Up Visit  Austin [name] [id number] October 13, 1955 65 y.o. 09/18/2020 9:14 AM [name], [name], MDBurchette, [name], MD   Principle Diagnosis: 65 year old man with kidney cancer diagnosed in 2013.  He subsequently developed stage IV disease involvement of the lung, peritoneum and bone.     Prior Therapy:   1. He is status post robotic-assisted laparoscopic nephrectomy on the left done on 04/23/2012. The pathology revealed renal cell carcinoma with a grade 3/4 with the pathological staging of T3a.   2. Votrient 800 mg daily started around 12/27/2013. This was reduced to 400 mg subsequently and have been discontinued since 02/03/2014 due to poor tolerance.  3.  He is status post radiation therapy to the left upper arm.  He received 40 Gray in 10 fractions completed on November 11, 2019.  Current therapy: Supportive care only.  He declined anticancer therapy.  Interim History: [name] is here for repeat evaluation.  Since the last visit, he reports no major changes in his health.  He has reported increased overall pain and fatigue.  His performance status at baseline is poor and has gotten worse.  He is currently on fentanyl patch and has been using tramadol 3-4 times a day on certain days.  His appetite has been poor and continues to lose weight.     Medications: Unchanged on review. Current Outpatient Medications  Medication Sig Dispense Refill  . acetaminophen (TYLENOL) 500 MG tablet Take 1 tablet (500 mg total) by mouth every 6 (six) hours as needed for mild pain, moderate pain, fever or headache. 30 tablet 0  . amLODipine (NORVASC) 5 MG tablet Take 5 mg by mouth daily.    . BD DISP NEEDLES 18G X 1-1/2" MISC USE TO INJECT TESTOSTERONE INTO THE MUSCLE AS DIRECTED EVERY 14 DAYS 10 each 0  . escitalopram (LEXAPRO) 10 MG tablet Take 1 tablet (10 mg total) by mouth every morning. 90 tablet 1  . fentaNYL (DURAGESIC) 25 MCG/HR Place 1 patch onto the skin every 3  (three) days. 10 patch 0  . fentaNYL (DURAGESIC) 25 MCG/HR Place 1 patch onto the skin every 3 days.  May refill in 1 month 10 patch 0  . fentaNYL (DURAGESIC) 25 MCG/HR Place 1 patch onto the skin every 3 days.  May refill in 2 months. 10 patch 0  . hydrocortisone (ANUSOL-HC) 25 MG suppository Place 1 suppository (25 mg total) rectally at bedtime. 7 suppository 0  . ketoconazole (NIZORAL) 2 % cream APPLY AS NEEDED TO RASH (Patient taking differently: Apply 1 application topically daily as needed for irritation. ) 60 g 0  . levothyroxine (SYNTHROID) 75 MCG tablet Take 1 tablet (75 mcg total) by mouth daily. 90 tablet 3  . Multiple Vitamin (MULTIVITAMIN WITH MINERALS) TABS tablet Take 1 tablet by mouth daily with breakfast.     . SAFETY-LOK 3CC SYR 22GX1.5" 22G X 1-1/2" 3 ML MISC USE TO INJECT 1 ML OF TESTOSTERONE EVERY 14 DAYS 10 each 0  . testosterone enanthate (DELATESTRYL) 200 MG/ML injection INJECT 1 ML INTRAMUSCULARLY  EVERY TWO WEEKS 10 mL 0  . traMADol (ULTRAM) 50 MG tablet TAKE 1 TABLET BY MOUTH EVERY 6 HOURS AS NEEDED 60 tablet 0  . traZODone (DESYREL) 100 MG tablet TAKE 1 TABLET BY MOUTH AT BEDTIME 90 tablet 0  . triamcinolone cream (KENALOG) 0.1 % Apply 1 application topically daily as needed (for rash).     No current facility-administered medications for this visit.  Allergies:  Allergies  Allergen Reactions  . Ace Inhibitors Other (See Comments)    Solitary kidney, experienced severe hyperkalemia on lisinopril (despite combined with HCTZ, no additional KCl)  . Angiotensin Receptor Blockers Other (See Comments)    See ACEi  . Penicillins Rash and Other (See Comments)    Has patient had a PCN reaction causing immediate rash, facial/tongue/throat swelling, SOB or lightheadedness with hypotension: YES Has patient had a PCN reaction causing severe rash involving mucus membranes or skin necrosis: No Has patient had a PCN reaction that required hospitalization No Has patient had  a PCN reaction occurring within the last 10 years: Yes  If all of the above answers are "NO", then may proceed with Cephalosporin use.        Physical Exam:  Blood pressure (!) 153/67, pulse 84, temperature 98.7 F (37.1 C), temperature source Tympanic, resp. rate 18, height 6' (1.829 m), SpO2 99 %.     ECOG: 2    General appearance: Comfortable appearing without any discomfort Head: Normocephalic without any trauma Oropharynx: Mucous membranes are moist and pink without any thrush or ulcers. Eyes: Pupils are equal and round reactive to light. Lymph nodes: No cervical, supraclavicular, inguinal or axillary lymphadenopathy.   Heart:regular rate and rhythm.  S1 and S2 without leg edema. Lung: Clear without any rhonchi or wheezes.  No dullness to percussion. Abdomin: Soft, nontender, nondistended with good bowel sounds.  No hepatosplenomegaly. Musculoskeletal: No joint deformity or effusion.  Full range of motion noted. Neurological: No deficits noted on motor, sensory and deep tendon reflex exam. Skin: No petechial rash or dryness.  Appeared moist.          Lab Results: Lab Results  Component Value Date   WBC 10.8 (H) 08/06/2020   HGB 10.5 (L) 08/06/2020   HCT 32.6 (L) 08/06/2020   MCV 83.0 08/06/2020   PLT 237.0 08/06/2020     Chemistry      Component Value Date/Time   NA 136 08/06/2020 0951   NA 138 03/13/2017 0922   K 5.0 08/06/2020 0951   K 4.6 03/13/2017 0922   CL 105 08/06/2020 0951   CO2 22 08/06/2020 0951   CO2 24 03/13/2017 0922   BUN 34 (H) 08/06/2020 0951   BUN 27.4 (H) 03/13/2017 0922   CREATININE 1.42 08/06/2020 0951   CREATININE 1.34 (H) 05/22/2020 0959   CREATININE 1.12 04/04/2020 1033   CREATININE 1.3 03/13/2017 0922      Component Value Date/Time   CALCIUM 8.8 08/06/2020 0951   CALCIUM 9.3 03/13/2017 0922   ALKPHOS 66 05/22/2020 0959   ALKPHOS 91 03/13/2017 0922   AST 9 (L) 05/22/2020 0959   AST 17 03/13/2017 0922   ALT 11  05/22/2020 0959   ALT 21 03/13/2017 0922   BILITOT <0.2 (L) 05/22/2020 0959   BILITOT 0.27 03/13/2017 0922        Impression and Plan:  65 year old man with:  1.  Kidney cancer diagnosed in 2013.  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  The natural course of his disease was updated at this time.  Treatment options were reviewed.  He has opted against anticancer treatment and has been on active surveillance.  Anticancer treatment options including oral targeted therapy and immunotherapy were reiterated again the likelihood that he will tolerate these drugs are low at this time and proceeding with supportive care only is reasonable.   At this time I recommended proceeding with hospice given  his overall decline and likely disease progression.  I see no reason specifically to obtain imaging studies at this time.  They will consider this option and they understand he has limited life expectancy.   2. Prognosis: He has incurable malignancy that is currently progressing.  He has limited performance status at baseline and likely proper poor prognosis and limited life expectancy.   3.  Pain: He has been on fentanyl patch with reasonable pain control.   4. Followup: In 4 months for repeat follow-up.  30  minutes were dedicated to this visit.  The time was spent on reviewing disease status, treatment options discussions and future plan of care reviewing prognosis.   Zola Button, MD 2/8/20229:14 AM

## 2020-09-20 ENCOUNTER — Telehealth: Payer: Self-pay | Admitting: Gastroenterology

## 2020-09-20 ENCOUNTER — Other Ambulatory Visit (INDEPENDENT_AMBULATORY_CARE_PROVIDER_SITE_OTHER): Payer: PPO

## 2020-09-20 DIAGNOSIS — R195 Other fecal abnormalities: Secondary | ICD-10-CM

## 2020-09-20 LAB — CBC WITH DIFFERENTIAL/PLATELET
Basophils Absolute: 0.1 10*3/uL (ref 0.0–0.1)
Basophils Relative: 1.2 % (ref 0.0–3.0)
Eosinophils Absolute: 0.4 10*3/uL (ref 0.0–0.7)
Eosinophils Relative: 3.7 % (ref 0.0–5.0)
HCT: 27.5 % — ABNORMAL LOW (ref 39.0–52.0)
Hemoglobin: 8.9 g/dL — ABNORMAL LOW (ref 13.0–17.0)
Lymphocytes Relative: 8.9 % — ABNORMAL LOW (ref 12.0–46.0)
Lymphs Abs: 1 10*3/uL (ref 0.7–4.0)
MCHC: 32.4 g/dL (ref 30.0–36.0)
MCV: 81 fl (ref 78.0–100.0)
Monocytes Absolute: 1.1 10*3/uL — ABNORMAL HIGH (ref 0.1–1.0)
Monocytes Relative: 9.7 % (ref 3.0–12.0)
Neutro Abs: 8.4 10*3/uL — ABNORMAL HIGH (ref 1.4–7.7)
Neutrophils Relative %: 76.5 % (ref 43.0–77.0)
Platelets: 275 10*3/uL (ref 150.0–400.0)
RBC: 3.4 Mil/uL — ABNORMAL LOW (ref 4.22–5.81)
RDW: 17.7 % — ABNORMAL HIGH (ref 11.5–15.5)
WBC: 11 10*3/uL — ABNORMAL HIGH (ref 4.0–10.5)

## 2020-09-20 LAB — IBC + FERRITIN
Ferritin: 126.2 ng/mL (ref 22.0–322.0)
Iron: 22 ug/dL — ABNORMAL LOW (ref 42–165)
Saturation Ratios: 9.2 % — ABNORMAL LOW (ref 20.0–50.0)
Transferrin: 170 mg/dL — ABNORMAL LOW (ref 212.0–360.0)

## 2020-09-20 MED ORDER — OMEPRAZOLE 40 MG PO CPDR: 40.0000 mg | DELAYED_RELEASE_CAPSULE | Freq: Two times a day (BID) | ORAL | 3 refills | Status: AC

## 2020-09-20 NOTE — Telephone Encounter (Signed)
Patient has a history of peptic ulcer disease and the dark stools are concerning for possibly upper GI bleeding I would start him on twice daily omeprazole 40 mg I would also like him to come for a CBC, ferritin plus IBC panel If he is having abdominal pain, high-volume bleeding or presyncopal symptoms he needs to go to the ER immediately

## 2020-09-20 NOTE — Telephone Encounter (Signed)
The pt has had dark stools, "coffee grounds" and BRB in colostomy bag since Monday. 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  He also has a history of peptic ulcer disease, metastatic renal cell carcinoma which has been progressive recently, remote CVAs resulting quadriplegia"  He also has complaints of nausea.  The wife has placed a call to the oncologist and PCP as well.  I offered an appt with Janett Billow for tomorrow at 1030 am and the pt declined appt (he wants to see Dr Hilarie Fredrickson only).  Next available with Dr Hilarie Fredrickson is not until April.  I will forward to Dr Hilarie Fredrickson for further recommendations.

## 2020-09-20 NOTE — Telephone Encounter (Signed)
Called and spoke to the pt wife.  She will have the pt begin omeprazole 40 mg BID (prescription sent)- labs have been entered and the pt wife will have him here tomorrow for labs.  She has been advised that if the pt develops pain, heavy bleeding or syncope he needs to go to the ED immediately. The pt has been advised of the information and verbalized understanding.

## 2020-09-20 NOTE — Telephone Encounter (Signed)
Patients wife calling to get advise she thinks the patient has blood in stool with nausea

## 2020-09-21 ENCOUNTER — Other Ambulatory Visit: Payer: Self-pay

## 2020-09-21 ENCOUNTER — Telehealth: Payer: Self-pay | Admitting: Family Medicine

## 2020-09-21 DIAGNOSIS — D649 Anemia, unspecified: Secondary | ICD-10-CM

## 2020-09-21 NOTE — Telephone Encounter (Signed)
Stefani from [facility] is calling with clinical questions regarding patient.  Reference Number: [id number]  Please advise.

## 2020-09-21 NOTE — Telephone Encounter (Signed)
Pharmacy sent request will process when received.

## 2020-09-24 ENCOUNTER — Other Ambulatory Visit (INDEPENDENT_AMBULATORY_CARE_PROVIDER_SITE_OTHER): Payer: PPO

## 2020-09-24 DIAGNOSIS — D649 Anemia, unspecified: Secondary | ICD-10-CM | POA: Diagnosis not present

## 2020-09-24 LAB — CBC WITH DIFFERENTIAL/PLATELET
Basophils Absolute: 0.1 10*3/uL (ref 0.0–0.1)
Basophils Relative: 1.3 % (ref 0.0–3.0)
Eosinophils Absolute: 0.5 10*3/uL (ref 0.0–0.7)
Eosinophils Relative: 5.4 % — ABNORMAL HIGH (ref 0.0–5.0)
HCT: 26.3 % — ABNORMAL LOW (ref 39.0–52.0)
Hemoglobin: 8.6 g/dL — ABNORMAL LOW (ref 13.0–17.0)
Lymphocytes Relative: 9.1 % — ABNORMAL LOW (ref 12.0–46.0)
Lymphs Abs: 0.9 10*3/uL (ref 0.7–4.0)
MCHC: 32.8 g/dL (ref 30.0–36.0)
MCV: 80.5 fl (ref 78.0–100.0)
Monocytes Absolute: 0.9 10*3/uL (ref 0.1–1.0)
Monocytes Relative: 9 % (ref 3.0–12.0)
Neutro Abs: 7.2 10*3/uL (ref 1.4–7.7)
Neutrophils Relative %: 75.2 % (ref 43.0–77.0)
Platelets: 298 10*3/uL (ref 150.0–400.0)
RBC: 3.27 Mil/uL — ABNORMAL LOW (ref 4.22–5.81)
RDW: 17.5 % — ABNORMAL HIGH (ref 11.5–15.5)
WBC: 9.5 10*3/uL (ref 4.0–10.5)

## 2020-09-25 ENCOUNTER — Other Ambulatory Visit: Payer: Self-pay

## 2020-09-25 DIAGNOSIS — D649 Anemia, unspecified: Secondary | ICD-10-CM

## 2020-09-26 ENCOUNTER — Other Ambulatory Visit (INDEPENDENT_AMBULATORY_CARE_PROVIDER_SITE_OTHER): Payer: PPO

## 2020-09-26 ENCOUNTER — Other Ambulatory Visit: Payer: Self-pay

## 2020-09-26 DIAGNOSIS — D649 Anemia, unspecified: Secondary | ICD-10-CM | POA: Diagnosis not present

## 2020-09-26 LAB — CBC WITH DIFFERENTIAL/PLATELET
Basophils Absolute: 0.1 10*3/uL (ref 0.0–0.1)
Basophils Relative: 1.1 % (ref 0.0–3.0)
Eosinophils Absolute: 0.5 10*3/uL (ref 0.0–0.7)
Eosinophils Relative: 4.3 % (ref 0.0–5.0)
HCT: 26.2 % — ABNORMAL LOW (ref 39.0–52.0)
Hemoglobin: 8.7 g/dL — ABNORMAL LOW (ref 13.0–17.0)
Lymphocytes Relative: 11.3 % — ABNORMAL LOW (ref 12.0–46.0)
Lymphs Abs: 1.2 10*3/uL (ref 0.7–4.0)
MCHC: 33.1 g/dL (ref 30.0–36.0)
MCV: 79.7 fl (ref 78.0–100.0)
Monocytes Absolute: 1 10*3/uL (ref 0.1–1.0)
Monocytes Relative: 9 % (ref 3.0–12.0)
Neutro Abs: 7.9 10*3/uL — ABNORMAL HIGH (ref 1.4–7.7)
Neutrophils Relative %: 74.3 % (ref 43.0–77.0)
Platelets: 314 10*3/uL (ref 150.0–400.0)
RBC: 3.29 Mil/uL — ABNORMAL LOW (ref 4.22–5.81)
RDW: 17.8 % — ABNORMAL HIGH (ref 11.5–15.5)
WBC: 10.6 10*3/uL — ABNORMAL HIGH (ref 4.0–10.5)

## 2020-10-01 ENCOUNTER — Telehealth: Payer: Self-pay | Admitting: Pharmacist

## 2020-10-01 NOTE — Chronic Care Management (AMB) (Signed)
Chronic Care Management Pharmacy Assistant   Name: Austin [name]  MRN: [id number] DOB: Jan 24, 1956  Reason for Encounter: Disease State  Patient Questions: 1.  Have you seen any other providers since your last visit?   09-18-2020 Wyatt Portela, MD Oncology  2.  Any changes in your medicines or health?   09-20-2020 started omeprazole 40 mg BID Zehr, Laban Emperor, PA-C Gastroenterology  PCP : [name], MD  Allergies:   Allergies  Allergen Reactions  . Ace Inhibitors Other (See Comments)    Solitary kidney, experienced severe hyperkalemia on lisinopril (despite combined with HCTZ, no additional KCl)  . Angiotensin Receptor Blockers Other (See Comments)    See ACEi  . Penicillins Rash and Other (See Comments)    Has patient had a PCN reaction causing immediate rash, facial/tongue/throat swelling, SOB or lightheadedness with hypotension: YES Has patient had a PCN reaction causing severe rash involving mucus membranes or skin necrosis: No Has patient had a PCN reaction that required hospitalization No Has patient had a PCN reaction occurring within the last 10 years: Yes  If all of the above answers are "NO", then may proceed with Cephalosporin use.    Medications: Outpatient Encounter Medications as of 10/01/2020  Medication Sig  . acetaminophen (TYLENOL) 500 MG tablet Take 1 tablet (500 mg total) by mouth every 6 (six) hours as needed for mild pain, moderate pain, fever or headache.  Marland Kitchen amLODipine (NORVASC) 5 MG tablet Take 5 mg by mouth daily.  . BD DISP NEEDLES 18G X 1-1/2" MISC USE TO INJECT TESTOSTERONE INTO THE MUSCLE AS DIRECTED EVERY 14 DAYS  . escitalopram (LEXAPRO) 10 MG tablet Take 1 tablet (10 mg total) by mouth every morning.  . fentaNYL (DURAGESIC) 25 MCG/HR Place 1 patch onto the skin every 3 (three) days.  . hydrocortisone (ANUSOL-HC) 25 MG suppository Place 1 suppository (25 mg total) rectally at bedtime.  Marland Kitchen ketoconazole (NIZORAL) 2 % cream APPLY AS  NEEDED TO RASH (Patient taking differently: Apply 1 application topically daily as needed for irritation. )  . levothyroxine (SYNTHROID) 75 MCG tablet Take 1 tablet (75 mcg total) by mouth daily.  . Multiple Vitamin (MULTIVITAMIN WITH MINERALS) TABS tablet Take 1 tablet by mouth daily with breakfast.   . omeprazole (PRILOSEC) 40 MG capsule Take 1 capsule (40 mg total) by mouth in the morning and at bedtime.  Marland Kitchen SAFETY-LOK 3CC SYR 22GX1.5" 22G X 1-1/2" 3 ML MISC USE TO INJECT 1 ML OF TESTOSTERONE EVERY 14 DAYS  . testosterone enanthate (DELATESTRYL) 200 MG/ML injection INJECT 1 ML INTRAMUSCULARLY  EVERY TWO WEEKS  . traMADol (ULTRAM) 50 MG tablet TAKE 1 TABLET BY MOUTH EVERY 6 HOURS AS NEEDED  . traZODone (DESYREL) 100 MG tablet TAKE 1 TABLET BY MOUTH AT BEDTIME  . triamcinolone cream (KENALOG) 0.1 % Apply 1 application topically daily as needed (for rash).   No facility-administered encounter medications on file as of 10/01/2020.    Current Diagnosis: Patient Active Problem List   Diagnosis Date Noted  . Normocytic anemia 08/06/2020  . Rectal discharge 03/23/2020  . Other constipation 03/23/2020  . Hyperkalemia 01/13/2020  . 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 Dehydration 10/18/2019  . Sepsis due to pneumonia ([place]) 10/18/2019  . CAP (community acquired pneumonia) 10/17/2019  . Malignant neoplasm of left upper extremity ([place]) 10/14/2019  . Hypothyroid 07/14/2018  . 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 AKI (acute kidney injury) ([place])   .  Colostomy in place  05/30/2017  . Blood loss anemia 05/29/2017  . 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 Perforation of sigmoid colon due to diverticulitis s/p colostomy 9/18 09/27/2016  . Fatigue 05/23/2015  . Hyperlipidemia 10/17/2013  . Renal carcinoma metastatic with carcinomatosis 11/09/2012  . Type 2 diabetes mellitus ([place]) 03/31/2012  . Anemia associated with acute  blood loss 02/28/2012  . Generalized abdominal pain 02/27/2012  . Low testosterone 06/21/2010  . WEAKNESS 04/03/2010  . FACIAL WEAKNESS 04/03/2010  . EDEMA 01/15/2009  . Pituitary tumor 01/15/2009  . Essential hypertension 10/16/2008  . Neurogenic bladder 10/16/2008  . OTHER SEBORRHEIC DERMATITIS 10/16/2008  . COLONIC POLYPS, HX OF 10/16/2008    Goals Addressed   None    Reviewed chart prior to disease state call. Spoke with patient regarding BP  Recent Office Vitals: BP Readings from Last 3 Encounters:  09/18/20 (!) 153/67  08/06/20 (!) 148/82  05/22/20 (!) 175/69   Pulse Readings from Last 3 Encounters:  09/18/20 84  08/06/20 80  05/22/20 75    Wt Readings from Last 3 Encounters:  03/21/20 180 lb (81.6 kg)  01/13/20 180 lb (81.6 kg)  10/17/19 180 lb (81.6 kg)     Kidney Function Lab Results  Component Value Date/Time   CREATININE 1.29 (H) 09/18/2020 09:14 AM   CREATININE 1.42 08/06/2020 09:51 AM   CREATININE 1.34 (H) 05/22/2020 09:59 AM   CREATININE 1.12 04/04/2020 10:33 AM   CREATININE 1.29 (H) 03/23/2020 10:18 AM   CREATININE 1.3 03/13/2017 09:22 AM   CREATININE 1.5 (H) 11/11/2016 09:13 AM   GFR 51.72 (L) 08/06/2020 09:51 AM   GFRNONAA >60 09/18/2020 09:14 AM   GFRNONAA 58 (L) 03/23/2020 10:18 AM   GFRAA 67 03/23/2020 10:18 AM    BMP Latest Ref Rng & Units 09/18/2020 08/06/2020 05/22/2020  Glucose 70 - 99 mg/dL 89 165(H) 112(H)  BUN 8 - 23 mg/dL 42(H) 34(H) 38(H)  Creatinine 0.61 - 1.24 mg/dL 1.29(H) 1.42 1.34(H)  BUN/Creat Ratio 6 - 22 (calc) - - -  Sodium 135 - 145 mmol/L 136 136 137  Potassium 3.5 - 5.1 mmol/L 5.3(H) 5.0 4.6  Chloride 98 - 111 mmol/L 109 105 107  CO2 22 - 32 mmol/L 20(L) 22 23  Calcium 8.9 - 10.3 mg/dL 9.5 8.8 9.7    . Current antihypertensive regimen:  o amlodipine 5 mg, 1 tablet once daily  . How often are you checking your Blood Pressure? 3-5x per week . Current home BP readings:   o 02/22 132/72 o 02/23 150/68 o 02/24 147/64 o 02/25 140/66 . What recent interventions/DTPs have been made by any provider to improve Blood Pressure control since last CPP Visit: None . Any recent hospitalizations or ED visits since last visit with CPP? No . What diet changes have been made to improve Blood Pressure Control?  o None . What exercise is being done to improve your Blood Pressure Control?  o None  Adherence Review: Is the patient currently on ACE/ARB medication? No Does the patient have >5 day gap between last estimated fill dates? No  Follow-Up:  Pharmacist Review    I spoke with the patient's wife Austin [name] (HIPPA) and discussed medication adherence with the patient, no issues at this time with current medication. The patient has started omeprazole (PRILOSEC) 40 MG capsule daily written by GI. No other changes to his medication currently. His wife denies ED visits since his last CPP follow-up. The patient denies any side effects with his medication.  Also, denies any problems with his current pharmacy.  Maia Breslow, [facility] Assistant [phone]

## 2020-10-05 ENCOUNTER — Other Ambulatory Visit (INDEPENDENT_AMBULATORY_CARE_PROVIDER_SITE_OTHER): Payer: PPO

## 2020-10-05 ENCOUNTER — Other Ambulatory Visit: Payer: Self-pay

## 2020-10-05 DIAGNOSIS — D649 Anemia, unspecified: Secondary | ICD-10-CM | POA: Diagnosis not present

## 2020-10-05 LAB — CBC WITH DIFFERENTIAL/PLATELET
Basophils Absolute: 0.2 10*3/uL — ABNORMAL HIGH (ref 0.0–0.1)
Basophils Relative: 1.9 % (ref 0.0–3.0)
Eosinophils Absolute: 0.3 10*3/uL (ref 0.0–0.7)
Eosinophils Relative: 3.8 % (ref 0.0–5.0)
HCT: 26.8 % — ABNORMAL LOW (ref 39.0–52.0)
Hemoglobin: 8.8 g/dL — ABNORMAL LOW (ref 13.0–17.0)
Lymphocytes Relative: 9.1 % — ABNORMAL LOW (ref 12.0–46.0)
Lymphs Abs: 0.8 10*3/uL (ref 0.7–4.0)
MCHC: 31.9 g/dL (ref 30.0–36.0)
MCV: 79.5 fl (ref 78.0–100.0)
Monocytes Absolute: 0.9 10*3/uL (ref 0.1–1.0)
Monocytes Relative: 10 % (ref 3.0–12.0)
Neutro Abs: 6.7 10*3/uL (ref 1.4–7.7)
Neutrophils Relative %: 75.2 % (ref 43.0–77.0)
Platelets: 253 10*3/uL (ref 150.0–400.0)
RBC: 3.38 Mil/uL — ABNORMAL LOW (ref 4.22–5.81)
RDW: 17.7 % — ABNORMAL HIGH (ref 11.5–15.5)
WBC: 8.9 10*3/uL (ref 4.0–10.5)

## 2020-10-08 ENCOUNTER — Telehealth: Payer: Self-pay | Admitting: Family Medicine

## 2020-10-08 NOTE — Telephone Encounter (Signed)
Please set up virtual or office follow up.  Want to discuss pain med increase further.

## 2020-10-08 NOTE — Telephone Encounter (Signed)
Follow up appointment scheduled.

## 2020-10-08 NOTE — Telephone Encounter (Signed)
Pt wife call and want to know dr.[name] increase his pain patch dose and need a new RX . Pt wife want a call back.

## 2020-10-09 ENCOUNTER — Encounter: Payer: Self-pay | Admitting: Family Medicine

## 2020-10-09 ENCOUNTER — Other Ambulatory Visit: Payer: Self-pay

## 2020-10-09 ENCOUNTER — Telehealth (INDEPENDENT_AMBULATORY_CARE_PROVIDER_SITE_OTHER): Payer: PPO | Admitting: Family Medicine

## 2020-10-09 VITALS — BP 154/67 | HR 77

## 2020-10-09 DIAGNOSIS — G893 Neoplasm related pain (acute) (chronic): Secondary | ICD-10-CM | POA: Diagnosis not present

## 2020-10-09 DIAGNOSIS — C649 Malignant neoplasm of unspecified kidney, except renal pelvis: Secondary | ICD-10-CM | POA: Diagnosis not present

## 2020-10-09 MED ORDER — FENTANYL 37.5 MCG/HR TD PT72: MEDICATED_PATCH | TRANSDERMAL | 0 refills | Status: DC

## 2020-10-09 NOTE — Progress Notes (Signed)
Patient ID: Austin [name], male   DOB: [birth year], 65 y.o.   MRN: [id number]  This visit type was conducted due to national recommendations for restrictions regarding the COVID-19 pandemic in an effort to limit this patient's exposure and mitigate transmission in our community.   Virtual Visit via Video Note  I connected with Austin [name] on 10/09/20 at  3:15 PM EST by a video enabled telemedicine application and verified that I am speaking with the correct person using two identifiers.  Location patient: home Location provider:work or home office Persons participating in the virtual visit: patient, provider  I discussed the limitations of evaluation and management by telemedicine and the availability of in person appointments. The patient expressed understanding and agreed to proceed.   HPI: Austin [name] has metastatic renal cell carcinoma.  Widespread metastases by most recent CT scans last summer involving abdomen, pelvis, chest.  He is currently on fentanyl 25 mcg patch and this was controlling his pain fairly well for some time.  Over the past couple weeks though he has had increasing pain level- sometimes 9 out of 10 severity.  Interfering some with sleep.  He had some decreased appetite.  No vomiting.  He particularly notes pain right rib cage area and also left hip and left upper thigh.  No fever.  Infrequently takes tramadol for supplementation.  He is wheelchair-bound.  Lives with wife who is his primary caregiver.  Current medications include amlodipine, Lexapro, levothyroxine, Prilosec, testosterone 200 mg injection every 2 weeks, trazodone 100 mg nightly  ROS: See pertinent positives and negatives per HPI.  Past Medical History:  Diagnosis Date  . 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 C. difficile colitis 02/08/2017  . Cerebrovascular accident ([place]) 1994   hx of brainstem stroke, residual diminished lung capacity  . Chronic  kidney disease    Left Renal Mass  . 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 Diverticulitis   . Duodenal ulcer   . GIB (gastrointestinal bleeding) 05/28/2017  . Hx of colonic polyps   . Hypertension    has been off BP meds since GI bleed was thought to have been caused by previously prescribed lisinopril   . Hypogonadism   . Intra-abdominal abscess ([place]) 01/23/2017  . Iron deficiency anemia   . 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 Open abdominal incision with drainage 01/26/2017  . Pituitary adenoma ([place])   . 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 Pneumonia   . PONV (postoperative nausea and vomiting)    vomited after colostomy placement   . Pre-diabetes    last a1c 5.6   . 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[place])   . Urinary incontinence   . Venous stasis    edema    Past Surgical History:  Procedure Laterality Date  . CHOLECYSTECTOMY    . COLON RESECTION N/A 05/04/2017   Procedure: LAPAROSCOPIC SIGMOID COLON RESECTION WITH END COLSOTOMY ERAS PATHWAY;  Surgeon: Alphonsa Overall, MD;  Location: [facility];  Service: General;  Laterality: N/A;  . COLONOSCOPY  02/27/2012   Procedure: COLONOSCOPY;  Surgeon: Jerene Bears, MD;  Location: [facility];  Service: Gastroenterology;  Laterality: N/A;  . COLONOSCOPY N/A 05/31/2017   Procedure: COLONOSCOPY;  Surgeon: Milus Banister, MD;  Location: [facility];  Service: Endoscopy;  Laterality: N/A;  . COLOSTOMY TAKEDOWN  N/A 08/20/2017   Procedure: LAPAROSCOPIC ASSISTED REVISION END COLOSTOMY AND ENTEROLYSIS OF ADHESIONS;  Surgeon: Alphonsa Overall, MD;  Location: [facility];  Service: General;  Laterality: N/A;  ERAS PATHWAY  . ESOPHAGOGASTRODUODENOSCOPY N/A 05/31/2017   Procedure: ESOPHAGOGASTRODUODENOSCOPY (EGD);  Surgeon: Milus Banister, MD;  Location: [facility];  Service: Endoscopy;  Laterality: N/A;  . gamma knife radiation surgery  05/2010   for  pituitary  . IR RADIOLOGIST EVAL & MGMT  02/12/2017  . IR RADIOLOGIST EVAL & MGMT  03/17/2017  . IR RADIOLOGIST EVAL & MGMT  04/14/2017  . IR RADIOLOGIST EVAL & MGMT  02/05/2017  . PITUITARY SURGERY  2007   nose approach  . ROBOT ASSISTED LAPAROSCOPIC NEPHRECTOMY  04/23/2012   Procedure: ROBOTIC ASSISTED LAPAROSCOPIC NEPHRECTOMY;  Surgeon: Alexis Frock, MD;  Location: [facility];  Service: Urology;  Laterality: Left;  radical  . stomach peg  02/1993   removed 5-6 years later  . TRACHEOSTOMY TUBE PLACEMENT  02/1993   removed 04/1993  . UMBILICAL HERNIA REPAIR  04/23/2012   Procedure: HERNIA REPAIR UMBILICAL ADULT;  Surgeon: Alexis Frock, MD;  Location: [facility];  Service: Urology;;  . [name]  . vocal cord surgery     injected with collagen, then fat from stomach to improve speech s/p stroke    Family History  Problem Relation Age of Onset  . Alcohol abuse Father   . Throat cancer Father   . Esophageal cancer Father 49  . Arthritis Mother   . Hyperlipidemia Mother   . Uterine cancer Mother 49  . Colon cancer Brother   . Arthritis Maternal Grandmother   . Hypertension Brother   . Stroke Paternal Grandmother     SOCIAL HX: Non-smoker   Current Outpatient Medications:  .  acetaminophen (TYLENOL) 500 MG tablet, Take 1 tablet (500 mg total) by mouth every 6 (six) hours as needed for mild pain, moderate pain, fever or headache., Disp: 30 tablet, Rfl: 0 .  amLODipine (NORVASC) 5 MG tablet, Take 5 mg by mouth daily., Disp: , Rfl:  .  BD DISP NEEDLES 18G X 1-1/2" MISC, USE TO INJECT TESTOSTERONE INTO THE MUSCLE AS DIRECTED EVERY 14 DAYS, Disp: 10 each, Rfl: 0 .  escitalopram (LEXAPRO) 10 MG tablet, Take 1 tablet (10 mg total) by mouth every morning., Disp: 90 tablet, Rfl: 1 .  fentaNYL 37.5 MCG/HR PT72, Apply one patch to skin as directed every 72 hours., Disp: 10 patch, Rfl: 0 .  ketoconazole (NIZORAL) 2 % cream, APPLY AS NEEDED TO RASH (Patient taking differently: Apply 1 application  topically daily as needed for irritation.), Disp: 60 g, Rfl: 0 .  levothyroxine (SYNTHROID) 75 MCG tablet, Take 1 tablet (75 mcg total) by mouth daily., Disp: 90 tablet, Rfl: 3 .  Multiple Vitamin (MULTIVITAMIN WITH MINERALS) TABS tablet, Take 1 tablet by mouth daily with breakfast. , Disp: , Rfl:  .  omeprazole (PRILOSEC) 40 MG capsule, Take 1 capsule (40 mg total) by mouth in the morning and at bedtime., Disp: 60 capsule, Rfl: 3 .  SAFETY-LOK 3CC SYR 22GX1.5" 22G X 1-1/2" 3 ML MISC, USE TO INJECT 1 ML OF TESTOSTERONE EVERY 14 DAYS, Disp: 10 each, Rfl: 0 .  testosterone enanthate (DELATESTRYL) 200 MG/ML injection, INJECT 1 ML INTRAMUSCULARLY  EVERY TWO WEEKS, Disp: 10 mL, Rfl: 0 .  traMADol (ULTRAM) 50 MG tablet, TAKE 1 TABLET BY MOUTH EVERY 6 HOURS AS NEEDED, Disp: 60 tablet, Rfl: 0 .  traZODone (DESYREL) 100 MG tablet,  TAKE 1 TABLET BY MOUTH AT BEDTIME, Disp: 90 tablet, Rfl: 0 .  triamcinolone cream (KENALOG) 0.1 %, Apply 1 application topically daily as needed (for rash)., Disp: , Rfl:   EXAM:  VITALS per patient if applicable:  GENERAL: alert, oriented, appears well and in no acute distress  HEENT: atraumatic, conjunttiva clear, no obvious abnormalities on inspection of external nose and ears  NECK: normal movements of the head and neck  LUNGS: on inspection no signs of respiratory distress, breathing rate appears normal, no obvious gross SOB, gasping or wheezing  CV: no obvious cyanosis  MS: moves all visible extremities without noticeable abnormality  PSYCH/NEURO: pleasant and cooperative, no obvious depression or anxiety, speech and thought processing grossly intact  ASSESSMENT AND PLAN:  Discussed the following assessment and plan:  Renal cell carcinoma with widespread metastases.  Pain poorly controlled currently on Duragesic 25 mcg patch  -We discussed increasing his fentanyl patch to 37.5 mcg every 72 hours -We did discuss possible oral medication if needed for  supplementation for breakthrough but they would like to try just increasing the patch dose first.     I discussed the assessment and treatment plan with the patient. The patient was provided an opportunity to ask questions and all were answered. The patient agreed with the plan and demonstrated an understanding of the instructions.   The patient was advised to call back or seek an in-person evaluation if the symptoms worsen or if the condition fails to improve as anticipated.     Carolann Littler, MD

## 2020-10-10 ENCOUNTER — Other Ambulatory Visit: Payer: Self-pay | Admitting: Family Medicine

## 2020-10-25 ENCOUNTER — Other Ambulatory Visit (INDEPENDENT_AMBULATORY_CARE_PROVIDER_SITE_OTHER): Payer: PPO

## 2020-10-25 DIAGNOSIS — D649 Anemia, unspecified: Secondary | ICD-10-CM

## 2020-10-25 DIAGNOSIS — Z933 Colostomy status: Secondary | ICD-10-CM | POA: Diagnosis not present

## 2020-10-25 LAB — CBC WITH DIFFERENTIAL/PLATELET
Basophils Absolute: 0.1 10*3/uL (ref 0.0–0.1)
Basophils Relative: 1.3 % (ref 0.0–3.0)
Eosinophils Absolute: 0.5 10*3/uL (ref 0.0–0.7)
Eosinophils Relative: 4.7 % (ref 0.0–5.0)
HCT: 23.8 % — CL (ref 39.0–52.0)
Hemoglobin: 7.8 g/dL — CL (ref 13.0–17.0)
Lymphocytes Relative: 11.7 % — ABNORMAL LOW (ref 12.0–46.0)
Lymphs Abs: 1.1 10*3/uL (ref 0.7–4.0)
MCHC: 32.8 g/dL (ref 30.0–36.0)
MCV: 78.8 fl (ref 78.0–100.0)
Monocytes Absolute: 1 10*3/uL (ref 0.1–1.0)
Monocytes Relative: 10.7 % (ref 3.0–12.0)
Neutro Abs: 6.9 10*3/uL (ref 1.4–7.7)
Neutrophils Relative %: 71.6 % (ref 43.0–77.0)
Platelets: 288 10*3/uL (ref 150.0–400.0)
RBC: 3.02 Mil/uL — ABNORMAL LOW (ref 4.22–5.81)
RDW: 18 % — ABNORMAL HIGH (ref 11.5–15.5)
WBC: 9.7 10*3/uL (ref 4.0–10.5)

## 2020-10-26 ENCOUNTER — Telehealth: Payer: Self-pay

## 2020-10-26 ENCOUNTER — Telehealth: Payer: Self-pay | Admitting: *Deleted

## 2020-10-26 DIAGNOSIS — D649 Anemia, unspecified: Secondary | ICD-10-CM

## 2020-10-26 NOTE — Telephone Encounter (Signed)
-----   Message from Wyatt Portela, MD sent at 10/26/2020  3:46 PM EDT ----- I asked them to repeat his CBC and iron and based on these results will arrange IV iron accordingly. THanks ----- Message ----- From: Tami Lin, RN Sent: 10/26/2020   3:44 PM EDT To: Wyatt Portela, MD  Patient was seen at [facility] today. Please see office note below. They called and want you to review the note.  [employer] reviewed.  1 g drop from previous H/H check.  I discussed with his wife, Peter Congo today.  She said he had dark stools about a week and a half ago, but that has since cleared and back to brown stools.  Otherwise no complaints now.  Still taking high-dose PPI bid.  Looks like Dr. Hilarie Fredrickson also reviewed these labs and had sent a message to try to coordinate with Dr. Alen Blew for IV iron.  Based on no active bleeding currently and overall clinical history, agree with trying to treat as outpatient as outlined.  Please try to assist in coordinating for IV iron through the Oncology clinic.  To continue high-dose PPI for now, with plan for repeat CBC early next week.  If continued anemia, plan for EGD for diagnostic and potentially therapeutic intent  We discussed precautions for him to come to the ER for admission and expedited inpatient work-up, to include recurrence of melena, etc., and she understands.  All questions answered and appreciative of phone call

## 2020-10-26 NOTE — Telephone Encounter (Signed)
Labs reviewed.  1 g drop from previous H/H check.  I discussed with his wife, Austin [name] today.  She said he had dark stools about a week and a half ago, but that has since cleared and back to brown stools.  Otherwise no complaints now.  Still taking high-dose PPI bid.  Looks like Dr. Hilarie Fredrickson also reviewed these labs and had sent a message to try to coordinate with Dr. Alen Blew for IV iron.  Based on no active bleeding currently and overall clinical history, agree with trying to treat as outpatient as outlined.  Please try to assist in coordinating for IV iron through the Oncology clinic.  To continue high-dose PPI for now, with plan for repeat CBC early next week.  If continued anemia, plan for EGD for diagnostic and potentially therapeutic intent  We discussed precautions for him to come to the ER for admission and expedited inpatient work-up, to include recurrence of melena, etc., and she understands.  All questions answered and appreciative of phone call.

## 2020-10-26 NOTE — Telephone Encounter (Signed)
Left message for Dr. Alen Blew office to call back.

## 2020-10-26 NOTE — Telephone Encounter (Signed)
See notes below

## 2020-10-26 NOTE — Telephone Encounter (Signed)
Patient had a critical hemoglobin.

## 2020-10-26 NOTE — Telephone Encounter (Signed)
Dr. Hazeline Junker office returning your call.

## 2020-10-29 ENCOUNTER — Other Ambulatory Visit: Payer: Self-pay

## 2020-10-29 ENCOUNTER — Telehealth: Payer: Self-pay

## 2020-10-29 ENCOUNTER — Other Ambulatory Visit: Payer: Self-pay | Admitting: Oncology

## 2020-10-29 DIAGNOSIS — D649 Anemia, unspecified: Secondary | ICD-10-CM

## 2020-10-29 DIAGNOSIS — D500 Iron deficiency anemia secondary to blood loss (chronic): Secondary | ICD-10-CM

## 2020-10-29 NOTE — Telephone Encounter (Signed)
Dr. Hazeline Junker office contacted patient to arrange IV iron infusion. See telephone contact from 10/26/20

## 2020-10-29 NOTE — Telephone Encounter (Signed)
-----   Message from Wyatt Portela, MD sent at 10/29/2020 10:03 AM EDT ----- I will send a message to scheduling to get him scheduled for lab + IV iron this week. Thanks ----- Message ----- From: Tami Lin, RN Sent: 10/29/2020   9:58 AM EDT To: Wyatt Portela, MD  Patient's wife called and wants to confirm that the plan is for the patient to be scheduled for IV iron. He had labs at [facility] on Friday and you said you were having them repeat the labs and would proceed accordingly.  Lanelle Bal

## 2020-10-29 NOTE — Telephone Encounter (Signed)
Called patient's wife and made her aware that Dr. Alen Blew sent a message to the scheduling department and she should expect a call to get her husband scheduled for a lab and IV Iron. Patient's wife verbalized understanding.

## 2020-11-01 ENCOUNTER — Other Ambulatory Visit: Payer: Self-pay | Admitting: Family Medicine

## 2020-11-01 NOTE — Telephone Encounter (Signed)
Patients wife calling regarding iron infusion and some rectal bleeding he patient is experiencing requesting to speak with a nurse.

## 2020-11-01 NOTE — Telephone Encounter (Signed)
The pt's wife called saying she has not received a call in regards to the iron infusion.  Dr Alen Blew placed the order and will be setting up that appt. She has been advised to call Dr Hazeline Junker office and inquire about appt.  The pt has been advised of the information and verbalized understanding.

## 2020-11-02 ENCOUNTER — Inpatient Hospital Stay: Payer: PPO

## 2020-11-02 ENCOUNTER — Other Ambulatory Visit: Payer: Self-pay

## 2020-11-02 ENCOUNTER — Inpatient Hospital Stay: Payer: PPO | Attending: Oncology

## 2020-11-02 VITALS — BP 137/63 | HR 73 | Temp 98.6°F | Resp 16

## 2020-11-02 DIAGNOSIS — C649 Malignant neoplasm of unspecified kidney, except renal pelvis: Secondary | ICD-10-CM

## 2020-11-02 DIAGNOSIS — D500 Iron deficiency anemia secondary to blood loss (chronic): Secondary | ICD-10-CM

## 2020-11-02 DIAGNOSIS — D649 Anemia, unspecified: Secondary | ICD-10-CM | POA: Insufficient documentation

## 2020-11-02 LAB — CMP (CANCER CENTER ONLY)
ALT: 9 U/L (ref 0–44)
AST: 11 U/L — ABNORMAL LOW (ref 15–41)
Albumin: 2.9 g/dL — ABNORMAL LOW (ref 3.5–5.0)
Alkaline Phosphatase: 58 U/L (ref 38–126)
Anion gap: 11 (ref 5–15)
BUN: 26 mg/dL — ABNORMAL HIGH (ref 8–23)
CO2: 19 mmol/L — ABNORMAL LOW (ref 22–32)
Calcium: 8.8 mg/dL — ABNORMAL LOW (ref 8.9–10.3)
Chloride: 106 mmol/L (ref 98–111)
Creatinine: 1.35 mg/dL — ABNORMAL HIGH (ref 0.61–1.24)
GFR, Estimated: 58 mL/min — ABNORMAL LOW (ref >60)
Glucose, Bld: 114 mg/dL — ABNORMAL HIGH (ref 70–99)
Potassium: 5.1 mmol/L (ref 3.5–5.1)
Sodium: 136 mmol/L (ref 135–145)
Total Bilirubin: 0.2 mg/dL — ABNORMAL LOW (ref 0.3–1.2)
Total Protein: 7.5 g/dL (ref 6.5–8.1)

## 2020-11-02 LAB — CBC WITH DIFFERENTIAL (CANCER CENTER ONLY)
Abs Immature Granulocytes: 0.03 10*3/uL (ref 0.00–0.07)
Basophils Absolute: 0.1 10*3/uL (ref 0.0–0.1)
Basophils Relative: 1 %
Eosinophils Absolute: 0.5 10*3/uL (ref 0.0–0.5)
Eosinophils Relative: 6 %
HCT: 24 % — ABNORMAL LOW (ref 39.0–52.0)
Hemoglobin: 7.5 g/dL — ABNORMAL LOW (ref 13.0–17.0)
Immature Granulocytes: 0 %
Lymphocytes Relative: 13 %
Lymphs Abs: 1.2 10*3/uL (ref 0.7–4.0)
MCH: 25.3 pg — ABNORMAL LOW (ref 26.0–34.0)
MCHC: 31.3 g/dL (ref 30.0–36.0)
MCV: 81.1 fL (ref 80.0–100.0)
Monocytes Absolute: 0.8 10*3/uL (ref 0.1–1.0)
Monocytes Relative: 9 %
Neutro Abs: 6.3 10*3/uL (ref 1.7–7.7)
Neutrophils Relative %: 71 %
Platelet Count: 285 10*3/uL (ref 150–400)
RBC: 2.96 MIL/uL — ABNORMAL LOW (ref 4.22–5.81)
RDW: 17.5 % — ABNORMAL HIGH (ref 11.5–15.5)
WBC Count: 8.8 10*3/uL (ref 4.0–10.5)
nRBC: 0 % (ref 0.0–0.2)

## 2020-11-02 LAB — IRON AND TIBC
Iron: 22 ug/dL — ABNORMAL LOW (ref 42–163)
Saturation Ratios: 11 % — ABNORMAL LOW (ref 20–55)
TIBC: 201 ug/dL — ABNORMAL LOW (ref 202–409)
UIBC: 180 ug/dL (ref 117–376)

## 2020-11-02 LAB — FERRITIN: Ferritin: 194 ng/mL (ref 24–336)

## 2020-11-02 MED ORDER — SODIUM CHLORIDE 0.9 % IV SOLN
510.0000 mg | Freq: Once | INTRAVENOUS | Status: AC
Start: 2020-11-02 — End: 2020-11-02
  Administered 2020-11-02: 510 mg via INTRAVENOUS
  Filled 2020-11-02: qty 510

## 2020-11-02 MED ORDER — SODIUM CHLORIDE 0.9 % IV SOLN
Freq: Once | INTRAVENOUS | Status: AC
  Filled 2020-11-02: qty 250

## 2020-11-02 NOTE — Patient Instructions (Signed)
Ferumoxytol injection What is this medicine? FERUMOXYTOL is an iron complex. Iron is used to make healthy red blood cells, which carry oxygen and nutrients throughout the body. This medicine is used to treat iron deficiency anemia. This medicine may be used for other purposes; ask your health care provider or pharmacist if you have questions. COMMON BRAND NAME(S): Feraheme What should I tell my health care provider before I take this medicine? They need to know if you have any of these conditions:  anemia not caused by low iron levels  high levels of iron in the blood  magnetic resonance imaging (MRI) test scheduled  an unusual or allergic reaction to iron, other medicines, foods, dyes, or preservatives  pregnant or trying to get pregnant  breast-feeding How should I use this medicine? This medicine is for injection into a vein. It is given by a health care professional in a hospital or clinic setting. Talk to your pediatrician regarding the use of this medicine in children. Special care may be needed. Overdosage: If you think you have taken too much of this medicine contact a poison control center or emergency room at once. NOTE: This medicine is only for you. Do not share this medicine with others. What if I miss a dose? It is important not to miss your dose. Call your doctor or health care professional if you are unable to keep an appointment. What may interact with this medicine? This medicine may interact with the following medications:  other iron products This list may not describe all possible interactions. Give your health care provider a list of all the medicines, herbs, non-prescription drugs, or dietary supplements you use. Also tell them if you smoke, drink alcohol, or use illegal drugs. Some items may interact with your medicine. What should I watch for while using this medicine? Visit your doctor or healthcare professional regularly. Tell your doctor or healthcare  professional if your symptoms do not start to get better or if they get worse. You may need blood work done while you are taking this medicine. You may need to follow a special diet. Talk to your doctor. Foods that contain iron include: whole grains/cereals, dried fruits, beans, or peas, leafy green vegetables, and organ meats (liver, kidney). What side effects may I notice from receiving this medicine? Side effects that you should report to your doctor or health care professional as soon as possible:  allergic reactions like skin rash, itching or hives, swelling of the face, lips, or tongue  breathing problems  changes in blood pressure  feeling faint or lightheaded, falls  fever or chills  flushing, sweating, or hot feelings  swelling of the ankles or feet Side effects that usually do not require medical attention (report to your doctor or health care professional if they continue or are bothersome):  diarrhea  headache  nausea, vomiting  stomach pain This list may not describe all possible side effects. Call your doctor for medical advice about side effects. You may report side effects to FDA at 1-800-FDA-1088. Where should I keep my medicine? This drug is given in a hospital or clinic and will not be stored at home. NOTE: This sheet is a summary. It may not cover all possible information. If you have questions about this medicine, talk to your doctor, pharmacist, or health care provider.  2021 Elsevier/Gold Standard (2016-09-15 20:21:10)  

## 2020-11-02 NOTE — Progress Notes (Signed)
Dr. Alen Blew aware of Hgb 7.5. No additional orders received.

## 2020-11-05 ENCOUNTER — Ambulatory Visit (INDEPENDENT_AMBULATORY_CARE_PROVIDER_SITE_OTHER): Payer: PPO

## 2020-11-05 ENCOUNTER — Other Ambulatory Visit: Payer: Self-pay | Admitting: Family Medicine

## 2020-11-05 ENCOUNTER — Ambulatory Visit (INDEPENDENT_AMBULATORY_CARE_PROVIDER_SITE_OTHER): Payer: PPO | Admitting: Family Medicine

## 2020-11-05 ENCOUNTER — Encounter: Payer: Self-pay | Admitting: Family Medicine

## 2020-11-05 ENCOUNTER — Other Ambulatory Visit: Payer: Self-pay

## 2020-11-05 ENCOUNTER — Telehealth: Payer: Self-pay

## 2020-11-05 VITALS — BP 130/58 | HR 81 | Temp 97.7°F

## 2020-11-05 DIAGNOSIS — R059 Cough, unspecified: Secondary | ICD-10-CM

## 2020-11-05 DIAGNOSIS — I10 Essential (primary) hypertension: Secondary | ICD-10-CM | POA: Diagnosis not present

## 2020-11-05 DIAGNOSIS — D509 Iron deficiency anemia, unspecified: Secondary | ICD-10-CM

## 2020-11-05 DIAGNOSIS — C649 Malignant neoplasm of unspecified kidney, except renal pelvis: Secondary | ICD-10-CM

## 2020-11-05 DIAGNOSIS — R0989 Other specified symptoms and signs involving the circulatory and respiratory systems: Secondary | ICD-10-CM | POA: Diagnosis not present

## 2020-11-05 DIAGNOSIS — J811 Chronic pulmonary edema: Secondary | ICD-10-CM | POA: Diagnosis not present

## 2020-11-05 MED ORDER — AMLODIPINE BESYLATE 5 MG PO TABS
5.0000 mg | ORAL_TABLET | Freq: Every day | ORAL | 3 refills | Status: DC
Start: 2020-11-05 — End: 2021-11-12

## 2020-11-05 MED ORDER — FENTANYL 50 MCG/HR TD PT72: 1.0000 | MEDICATED_PATCH | TRANSDERMAL | 0 refills | Status: DC

## 2020-11-05 NOTE — Patient Instructions (Signed)
Managing Cancer Pain Cancer pain is different for everyone. It is important to work with your cancer care team to develop a pain management plan that is best for you. There are many options for pain control, and cancer pain can usually be managed with the right plan. Making lifestyle changes and following home care instructions from your cancer care team are also important parts of your plan. This can improve your quality of life while managing cancer pain. How to manage lifestyle changes Managing cancer pain can be stressful and exhausting. You may find that it is harder to control your pain when your levels of stress and exhaustion increase. Making lifestyle changes may help you lower stress and manage cancer pain. Managing stress  Try stress reduction techniques. These may lower stress and anxiety and take your mind off your pain. Ask your cancer care team for advice on good options. You may want to try: ? Guided relaxation. ? Meditation. ? Self-hypnosis. ? Deep breathing. ? Gentle yoga. ? Tai chi. ? Massage.  Get regular exercise. Exercise lowers stress and helps you sleep better at night. Ask your cancer care team what type of exercise is best for you.  Make sure you have a good support system of friends, family, and other care providers. Use your support system for emotional support and for help with daily chores and activities.  Consider joining a cancer support group. Ask your cancer care team to recommend one in your area.  Do not use drugs or alcohol to manage stress. This can make both stress and pain worse.   Avoiding exhaustion  Eat a healthy diet. Work with your cancer care team to come up with a nutritious diet that is best for you.  Plan activities for times when you are most rested and your pain is best controlled. This will help you avoid becoming physically exhausted.  Get enough sleep. Being overtired can increase pain. Tell your cancer care team if you are having  trouble sleeping. Follow these instructions at home: Medicines  Take over-the-counter and prescription medicines only as told by your health care provider.  Take your pain medicine on a regular schedule before pain becomes too severe.  You may have medicine for pain that happens in between doses of your usual pain control medicine (breakthrough pain). Do not wait until breakthrough pain is severe before taking the designated medicine.  Do not stop or reduce your pain medicine before talking to your cancer care team.  Do not crush or break pain pills unless your health care provider says you can.  Keep a 1-week supply of pain medicine on hand. Store your medicine safely away from children and pets.  Keep a complete list of all your medicines. Activity  Return to your normal activities as told by your health care provider. Ask your health care provider what activities are safe for you.  Do exercises as told by your health care provider. Increase your activity level as your pain is relieved.  Do not drive or use heavy machinery while taking prescription pain medicine. Managing constipation You may need to take actions to prevent or treat constipation, such as:  Drink enough fluid to keep your urine pale yellow.  Take over-the-counter or prescription medicines.  Eat foods that are high in fiber, such as beans, whole grains, and fresh fruits and vegetables.  Limit foods that are high in fat and processed sugars, such as fried or sweet foods. Alcohol use  Do not drink alcohol if: ?  Your health care provider tells you not to drink. ? You are pregnant, may be pregnant, or are planning to become pregnant.  If you drink alcohol: ? Limit how much you use to:  0-1 drink a day for women.  0-2 drinks a day for men. ? Be aware of how much alcohol is in your drink. In the [place] one drink equals one 12 oz bottle of beer (355 mL), one 5 oz glass of wine (148 mL), or one 1 oz glass of  hard liquor (44 mL). General instructions  Ask your cancer care team about keeping a pain diary. This may help your care team adjust your pain control plan as needed.  Ask to meet with a pain specialist who can help create a plan of care that works well for you.  Use gentle creams and lotions to keep your skin moist.  Do not use any products that contain nicotine or tobacco, such as cigarettes, e-cigarettes, and chewing tobacco. If you need help quitting, ask your health care provider.  Keep all follow-up visits as told by your health care provider. This is important.   Where to find more information  American Cancer Society: www.cancer.[place]: www.cancer.gov Contact a health care provider if:  Your pain medicine is not controlling your pain.  You have trouble sleeping.  You feel depressed or anxious.  Your pain medicine is making you nauseous, sleepy, dizzy, or constipated.  You are unable to manage your pain at home. Summary  Cancer pain is different for everyone. Work with your cancer care team to develop the pain management plan that is best for you.  Making lifestyle changes and following home care instructions can improve your quality of life while managing cancer pain.  Lifestyle changes include eating a nutritious diet, getting regular exercise, and managing stress.  Carefully follow instructions for taking your medicine as told by your cancer care team.  Let your cancer care team know if your pain is not controlled at home. This information is not intended to replace advice given to you by your health care provider. Make sure you discuss any questions you have with your health care provider. Document Revised: 04/20/2019 Document Reviewed: 04/01/2018 Elsevier Patient Education  2021 [employer].

## 2020-11-05 NOTE — Telephone Encounter (Signed)
Pt has known metastatic renal cancer.  I will call wife to discuss.

## 2020-11-05 NOTE — Telephone Encounter (Signed)
[facility] called with critical results. nodular opacity bilaterally, left well seen, compared to prior cp examination the fuse in the special thickiening may represent a degree of bronchitis, a degree lymphangitic spread of carcinoma given clinical history can not be excluded my radiography.

## 2020-11-05 NOTE — Progress Notes (Signed)
Established Patient Office Visit  Subjective:  Patient ID: Austin [name], male    DOB: 1956-07-10  Age: 65 y.o. MRN: [id number]  CC:  Chief Complaint  Patient presents with  . Follow-up    Pt had and iron infusion last Friday and his Hbg was low.    HPI Austin [name] presents for medical follow-up.  He has chronic medical problems including history of renal carcinoma metastatic with carcinomatosis.  He has had some chronic predominantly right-sided abdominal pains and side pain but states he aches "all over ".  He is starting to have more bone pain in multiple areas.  CT scans last summer revealed interval increase in size of bilateral metastatic pulmonary nodules and also increase in size of mass within the right hemiabdomen.  He had evidence for increased metastatic nodularity within the abdomen and pelvis.  He has been on fentanyl patch 37.5 mcg/h.  We had recently titrated up to that dose and he did see some improvement.  However he still has frequent pain 7-8 out of 10 and wife thinks he may be underestimating.  By her assessment, he seems uncomfortable frequently.  He states that at times his pain is 3-4 but frequently 7-8 out of 10.  They had called recently requesting whether they could titrate up the Duragesic patch.  Iron deficiency.  He had infusion of iron last Friday.  His ferritin was normal because of his metastatic cancer but iron levels were low.  Other issues he had relatively chronic cough.  No hemoptysis.  Does have known pulmonary involvement with his metastatic renal cancer.  No fever.  He has hypertension and is requesting refills of amlodipine.  Blood pressures have been stable.  Past Medical History:  Diagnosis Date  . 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 C. difficile colitis 02/08/2017  . Cerebrovascular accident ([place]) 1994   hx of brainstem stroke, residual diminished lung capacity  . Chronic kidney disease     Left Renal Mass  . 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 Diverticulitis   . Duodenal ulcer   . GIB (gastrointestinal bleeding) 05/28/2017  . Hx of colonic polyps   . Hypertension    has been off BP meds since GI bleed was thought to have been caused by previously prescribed lisinopril   . Hypogonadism   . Intra-abdominal abscess ([place]) 01/23/2017  . Iron deficiency anemia   . 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 Open abdominal incision with drainage 01/26/2017  . Pituitary adenoma ([place])   . 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 Pneumonia   . PONV (postoperative nausea and vomiting)    vomited after colostomy placement   . Pre-diabetes    last a1c 5.6   . 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[place])   . Urinary incontinence   . Venous stasis    edema    Past Surgical History:  Procedure Laterality Date  . CHOLECYSTECTOMY    . COLON RESECTION N/A 05/04/2017   Procedure: LAPAROSCOPIC SIGMOID COLON RESECTION WITH END COLSOTOMY ERAS PATHWAY;  Surgeon: Alphonsa Overall, MD;  Location: [facility];  Service: General;  Laterality: N/A;  . COLONOSCOPY  02/27/2012   Procedure: COLONOSCOPY;  Surgeon: Jerene Bears, MD;  Location: [facility];  Service: Gastroenterology;  Laterality: N/A;  . COLONOSCOPY N/A 05/31/2017  Procedure: COLONOSCOPY;  Surgeon: Milus Banister, MD;  Location: [facility];  Service: Endoscopy;  Laterality: N/A;  . COLOSTOMY TAKEDOWN N/A 08/20/2017   Procedure: LAPAROSCOPIC ASSISTED REVISION END COLOSTOMY AND ENTEROLYSIS OF ADHESIONS;  Surgeon: Alphonsa Overall, MD;  Location: [facility];  Service: General;  Laterality: N/A;  ERAS PATHWAY  . ESOPHAGOGASTRODUODENOSCOPY N/A 05/31/2017   Procedure: ESOPHAGOGASTRODUODENOSCOPY (EGD);  Surgeon: Milus Banister, MD;  Location: [facility];  Service: Endoscopy;  Laterality: N/A;  . gamma knife radiation surgery  05/2010   for pituitary  .  IR RADIOLOGIST EVAL & MGMT  02/12/2017  . IR RADIOLOGIST EVAL & MGMT  03/17/2017  . IR RADIOLOGIST EVAL & MGMT  04/14/2017  . IR RADIOLOGIST EVAL & MGMT  02/05/2017  . PITUITARY SURGERY  2007   nose approach  . ROBOT ASSISTED LAPAROSCOPIC NEPHRECTOMY  04/23/2012   Procedure: ROBOTIC ASSISTED LAPAROSCOPIC NEPHRECTOMY;  Surgeon: Alexis Frock, MD;  Location: [facility];  Service: Urology;  Laterality: Left;  radical  . stomach peg  02/1993   removed 5-6 years later  . TRACHEOSTOMY TUBE PLACEMENT  02/1993   removed 04/1993  . UMBILICAL HERNIA REPAIR  04/23/2012   Procedure: HERNIA REPAIR UMBILICAL ADULT;  Surgeon: Alexis Frock, MD;  Location: [facility];  Service: Urology;;  . [name]  . vocal cord surgery     injected with collagen, then fat from stomach to improve speech s/p stroke    Family History  Problem Relation Age of Onset  . Alcohol abuse Father   . Throat cancer Father   . Esophageal cancer Father 77  . Arthritis Mother   . Hyperlipidemia Mother   . Uterine cancer Mother 29  . Colon cancer Brother   . Arthritis Maternal Grandmother   . Hypertension Brother   . Stroke Paternal Grandmother     Social History   Socioeconomic History  . Marital status: Married    Spouse name: Not on file  . Number of children: 3  . Years of education: Not on file  . Highest education level: Not on file  Occupational History  . Occupation: disabled    Fish farm manager: UNEMPLOYED    Employer: [employer]    Comment: Retired  . Occupation: Psychiatrist    Comment: retired  Tobacco Use  . Smoking status: Former Smoker    Packs/day: 0.50    Years: 23.00    Pack years: 11.50    Types: Cigarettes    Quit date: 09/24/1992    Years since quitting: 28.1  . Smokeless tobacco: Never Used  Vaping Use  . Vaping Use: Never used  Substance and Sexual Activity  . Alcohol use: No    Comment: rare  . Drug use: No  . Sexual activity: Not on file  Other Topics Concern   . Not on file  Social History Narrative   Retired, disabled 1994 Quadreplegic      10/13/2018:   Originally from [place].   Lives with wife on one level apartment. Wife Peter Congo is main caregiver.   Has 2 daughters, 1 son, 6 grandchildren.   Loves spending time with grandchildren, some of whom are local and visit frequently. Some family still in [place] with whom he face-times.   Attends church, active with church community with Peter Congo   Enjoys working on the computer, going outside in his motorized wheelchair   Social Determinants of Health   Financial Resource Strain: Low Risk   . Difficulty of Paying Living Expenses:  Not hard at all  Food Insecurity: No Food Insecurity  . Worried About Charity fundraiser in the Last Year: Never true  . Ran Out of Food in the Last Year: Never true  Transportation Needs: No Transportation Needs  . Lack of Transportation (Medical): No  . Lack of Transportation (Non-Medical): No  Physical Activity: Inactive  . Days of Exercise per Week: 0 days  . Minutes of Exercise per Session: 0 min  Stress: No Stress Concern Present  . Feeling of Stress : Not at all  Social Connections: Moderately Integrated  . Frequency of Communication with Friends and Family: More than three times a week  . Frequency of Social Gatherings with Friends and Family: Once a week  . Attends Religious Services: More than 4 times per year  . Active Member of Clubs or Organizations: No  . Attends Archivist Meetings: Never  . Marital Status: Married  Human resources officer Violence: Not At Risk  . Fear of Current or Ex-Partner: No  . Emotionally Abused: No  . Physically Abused: No  . Sexually Abused: No    Outpatient Medications Prior to Visit  Medication Sig Dispense Refill  . acetaminophen (TYLENOL) 500 MG tablet Take 1 tablet (500 mg total) by mouth every 6 (six) hours as needed for mild pain, moderate pain, fever or headache. 30 tablet 0  . BD DISP NEEDLES 18G X 1-1/2" MISC USE  TO INJECT TESTOSTERONE INTO THE MUSCLE AS DIRECTED EVERY 14 DAYS 10 each 0  . escitalopram (LEXAPRO) 10 MG tablet Take 1 tablet (10 mg total) by mouth every morning. 90 tablet 1  . ketoconazole (NIZORAL) 2 % cream APPLY AS NEEDED TO RASH (Patient taking differently: Apply 1 application topically daily as needed for irritation.) 60 g 0  . levothyroxine (SYNTHROID) 75 MCG tablet Take 1 tablet (75 mcg total) by mouth daily. 90 tablet 3  . Multiple Vitamin (MULTIVITAMIN WITH MINERALS) TABS tablet Take 1 tablet by mouth daily with breakfast.     . SAFETY-LOK 3CC SYR 22GX1.5" 22G X 1-1/2" 3 ML MISC USE TO INJECT 1 ML OF TESTOSTERONE EVERY 14 DAYS 10 each 0  . testosterone enanthate (DELATESTRYL) 200 MG/ML injection INJECT 1 ML INTRAMUSCULARLY  EVERY TWO WEEKS 10 mL 0  . traMADol (ULTRAM) 50 MG tablet TAKE 1 TABLET BY MOUTH EVERY 6 HOURS AS NEEDED 60 tablet 0  . traZODone (DESYREL) 100 MG tablet TAKE 1 TABLET BY MOUTH AT BEDTIME 90 tablet 3  . triamcinolone cream (KENALOG) 0.1 % Apply 1 application topically daily as needed (for rash).    Marland Kitchen amLODipine (NORVASC) 5 MG tablet Take 5 mg by mouth daily.    . fentaNYL 37.5 MCG/HR PT72 Apply one patch to skin as directed every 72 hours. 10 patch 0  . omeprazole (PRILOSEC) 40 MG capsule Take 1 capsule (40 mg total) by mouth in the morning and at bedtime. 60 capsule 3   No facility-administered medications prior to visit.    Allergies  Allergen Reactions  . Ace Inhibitors Other (See Comments)    Solitary kidney, experienced severe hyperkalemia on lisinopril (despite combined with HCTZ, no additional KCl)  . Angiotensin Receptor Blockers Other (See Comments)    See ACEi  . Penicillins Rash and Other (See Comments)    Has patient had a PCN reaction causing immediate rash, facial/tongue/throat swelling, SOB or lightheadedness with hypotension: YES Has patient had a PCN reaction causing severe rash involving mucus membranes or skin necrosis: No Has patient  had  a PCN reaction that required hospitalization No Has patient had a PCN reaction occurring within the last 10 years: Yes  If all of the above answers are "NO", then may proceed with Cephalosporin use.    ROS Review of Systems  Constitutional: Positive for fatigue. Negative for chills and fever.  Eyes: Negative for visual disturbance.  Respiratory: Positive for cough. Negative for chest tightness and shortness of breath.   Cardiovascular: Negative for chest pain, palpitations and leg swelling.  Neurological: Negative for dizziness, syncope, weakness, light-headedness and headaches.      Objective:    Physical Exam Vitals reviewed.  Cardiovascular:     Rate and Rhythm: Normal rate and regular rhythm.  Pulmonary:     Comments: He has diminished breath sounds right lung base compared to left.  Does have a few rales in the right base. Musculoskeletal:     Comments: He has trace edema lower legs bilaterally  Neurological:     Mental Status: He is alert.     BP (!) 130/58 (BP Location: Left Arm, Patient Position: Sitting, Cuff Size: Normal)   Pulse 81   Temp 97.7 F (36.5 C) (Oral)   SpO2 99%  Wt Readings from Last 3 Encounters:  03/21/20 180 lb (81.6 kg)  01/13/20 180 lb (81.6 kg)  10/17/19 180 lb (81.6 kg)     Health Maintenance Due  Topic Date Due  . Hepatitis C Screening  Never done  . OPHTHALMOLOGY EXAM  02/23/2019  . HEMOGLOBIN A1C  04/15/2019  . PNA vac Low Risk Adult (1 of 2 - PCV13) 09/25/2019  . TETANUS/TDAP  11/13/2019  . COVID-19 Vaccine (4 - Booster for Pfizer series) 10/25/2020    There are no preventive care reminders to display for this patient.  Lab Results  Component Value Date   TSH 5.55 (H) 08/06/2020   Lab Results  Component Value Date   WBC 8.8 11/02/2020   HGB 7.5 (L) 11/02/2020   HCT 24.0 (L) 11/02/2020   MCV 81.1 11/02/2020   PLT 285 11/02/2020   Lab Results  Component Value Date   NA 136 11/02/2020   K 5.1 11/02/2020   CHLORIDE  106 03/13/2017   CO2 19 (L) 11/02/2020   GLUCOSE 114 (H) 11/02/2020   BUN 26 (H) 11/02/2020   CREATININE 1.35 (H) 11/02/2020   BILITOT 0.2 (L) 11/02/2020   ALKPHOS 58 11/02/2020   AST 11 (L) 11/02/2020   ALT 9 11/02/2020   PROT 7.5 11/02/2020   ALBUMIN 2.9 (L) 11/02/2020   CALCIUM 8.8 (L) 11/02/2020   ANIONGAP 11 11/02/2020   EGFR 56 (L) 03/13/2017   GFR 51.72 (L) 08/06/2020   Lab Results  Component Value Date   CHOL 193 04/14/2018   Lab Results  Component Value Date   HDL 31.50 (L) 04/14/2018   Lab Results  Component Value Date   LDLCALC 72 12/22/2016   Lab Results  Component Value Date   TRIG 313.0 (H) 04/14/2018   Lab Results  Component Value Date   CHOLHDL 6 04/14/2018   Lab Results  Component Value Date   HGBA1C 5.9 (A) 10/13/2018      Assessment & Plan:   #1 metastatic renal cell cancer with increasing pain.  He has known involvement of the right hemiabdomen and also throughout the thorax. -We discuss increasing his fentanyl patch from 37.5 mcg/h to 50 mcg/h.  We discussed risk of increasing opioid.  He is not on any benzodiazepines.  They will give  me some feedback in the next couple weeks regarding this dosage  #2 hypertension stable -Refill amlodipine 5 mg daily for 1 year  #3 cough.  Question related to his metastatic renal cell cancer.  Does have diminished breath sounds right base -1 view PA chest film obtained  #4 iron deficiency anemia.  He had transfusion last Friday. -We will be getting follow-up labs with CBC through oncology.  Meds ordered this encounter  Medications  . amLODipine (NORVASC) 5 MG tablet    Sig: Take 1 tablet (5 mg total) by mouth daily.    Dispense:  90 tablet    Refill:  3  . fentaNYL (DURAGESIC) 50 MCG/HR    Sig: Place 1 patch onto the skin every 3 (three) days.    Dispense:  10 patch    Refill:  0    Follow-up: No follow-ups on file.    Carolann Littler, MD

## 2020-11-06 ENCOUNTER — Telehealth: Payer: Self-pay | Admitting: Oncology

## 2020-11-06 NOTE — Telephone Encounter (Signed)
Scheduled per 03/21 scheduled message, patient has been called and notified of upcoming appointments.

## 2020-11-08 ENCOUNTER — Inpatient Hospital Stay: Payer: PPO

## 2020-11-08 ENCOUNTER — Other Ambulatory Visit: Payer: Self-pay

## 2020-11-08 VITALS — BP 143/56 | HR 75 | Temp 98.0°F | Resp 18

## 2020-11-08 DIAGNOSIS — D649 Anemia, unspecified: Secondary | ICD-10-CM | POA: Diagnosis not present

## 2020-11-08 DIAGNOSIS — D500 Iron deficiency anemia secondary to blood loss (chronic): Secondary | ICD-10-CM

## 2020-11-08 MED ORDER — SODIUM CHLORIDE 0.9 % IV SOLN
Freq: Once | INTRAVENOUS | Status: AC
  Filled 2020-11-08: qty 250

## 2020-11-08 MED ORDER — SODIUM CHLORIDE 0.9 % IV SOLN
510.0000 mg | Freq: Once | INTRAVENOUS | Status: AC
  Administered 2020-11-08: 510 mg via INTRAVENOUS
  Filled 2020-11-08: qty 510

## 2020-11-08 NOTE — Patient Instructions (Signed)
Ferumoxytol injection What is this medicine? FERUMOXYTOL is an iron complex. Iron is used to make healthy red blood cells, which carry oxygen and nutrients throughout the body. This medicine is used to treat iron deficiency anemia. This medicine may be used for other purposes; ask your health care provider or pharmacist if you have questions. COMMON BRAND NAME(S): Feraheme What should I tell my health care provider before I take this medicine? They need to know if you have any of these conditions:  anemia not caused by low iron levels  high levels of iron in the blood  magnetic resonance imaging (MRI) test scheduled  an unusual or allergic reaction to iron, other medicines, foods, dyes, or preservatives  pregnant or trying to get pregnant  breast-feeding How should I use this medicine? This medicine is for injection into a vein. It is given by a health care professional in a hospital or clinic setting. Talk to your pediatrician regarding the use of this medicine in children. Special care may be needed. Overdosage: If you think you have taken too much of this medicine contact a poison control center or emergency room at once. NOTE: This medicine is only for you. Do not share this medicine with others. What if I miss a dose? It is important not to miss your dose. Call your doctor or health care professional if you are unable to keep an appointment. What may interact with this medicine? This medicine may interact with the following medications:  other iron products This list may not describe all possible interactions. Give your health care provider a list of all the medicines, herbs, non-prescription drugs, or dietary supplements you use. Also tell them if you smoke, drink alcohol, or use illegal drugs. Some items may interact with your medicine. What should I watch for while using this medicine? Visit your doctor or healthcare professional regularly. Tell your doctor or healthcare  professional if your symptoms do not start to get better or if they get worse. You may need blood work done while you are taking this medicine. You may need to follow a special diet. Talk to your doctor. Foods that contain iron include: whole grains/cereals, dried fruits, beans, or peas, leafy green vegetables, and organ meats (liver, kidney). What side effects may I notice from receiving this medicine? Side effects that you should report to your doctor or health care professional as soon as possible:  allergic reactions like skin rash, itching or hives, swelling of the face, lips, or tongue  breathing problems  changes in blood pressure  feeling faint or lightheaded, falls  fever or chills  flushing, sweating, or hot feelings  swelling of the ankles or feet Side effects that usually do not require medical attention (report to your doctor or health care professional if they continue or are bothersome):  diarrhea  headache  nausea, vomiting  stomach pain This list may not describe all possible side effects. Call your doctor for medical advice about side effects. You may report side effects to FDA at 1-800-FDA-1088. Where should I keep my medicine? This drug is given in a hospital or clinic and will not be stored at home. NOTE: This sheet is a summary. It may not cover all possible information. If you have questions about this medicine, talk to your doctor, pharmacist, or health care provider.  2021 Elsevier/Gold Standard (2016-09-15 20:21:10)  

## 2020-11-30 ENCOUNTER — Other Ambulatory Visit: Payer: Self-pay | Admitting: Family Medicine

## 2020-11-30 NOTE — Telephone Encounter (Signed)
Last filled 07/10/2020 Last OV 11/05/2020 (acute) Due for a physical.  Ok to fill?

## 2020-12-04 DIAGNOSIS — C778 Secondary and unspecified malignant neoplasm of lymph nodes of multiple regions: Secondary | ICD-10-CM | POA: Diagnosis not present

## 2020-12-04 DIAGNOSIS — C642 Malignant neoplasm of left kidney, except renal pelvis: Secondary | ICD-10-CM | POA: Diagnosis not present

## 2020-12-04 DIAGNOSIS — Z515 Encounter for palliative care: Secondary | ICD-10-CM | POA: Diagnosis not present

## 2020-12-04 DIAGNOSIS — N319 Neuromuscular dysfunction of bladder, unspecified: Secondary | ICD-10-CM | POA: Diagnosis not present

## 2020-12-04 DIAGNOSIS — Z905 Acquired absence of kidney: Secondary | ICD-10-CM | POA: Diagnosis not present

## 2020-12-06 ENCOUNTER — Telehealth: Payer: Self-pay | Admitting: Family Medicine

## 2020-12-06 NOTE — Telephone Encounter (Signed)
Last office visit- 11/05/20 Last  Refill-11/05/20   No future visit scheduled

## 2020-12-06 NOTE — Telephone Encounter (Signed)
Pt wife call and stated he need a refill on his  fentaNYL (DURAGESIC) 50 MCG/HR sent to  [place], [place] - [place] [address] Phone:  [phone]  Fax:  [phone]

## 2020-12-09 MED ORDER — FENTANYL 50 MCG/HR TD PT72: MEDICATED_PATCH | TRANSDERMAL | 0 refills | Status: DC

## 2020-12-09 MED ORDER — FENTANYL 50 MCG/HR TD PT72: 1.0000 | MEDICATED_PATCH | TRANSDERMAL | 0 refills | Status: DC

## 2020-12-11 ENCOUNTER — Telehealth: Payer: Self-pay | Admitting: Family Medicine

## 2020-12-11 NOTE — Telephone Encounter (Signed)
Austin [name] is calling in wanting to know if the order forms are ready that he dropped off on Friday.  If you can not locate the forms Austin [name] will accept the standard orders from the provider and he also want to know if Dr. [name] will be the attending physician for the pt while at hospice.  The order forms can be faxed to [phone].

## 2020-12-11 NOTE — Telephone Encounter (Signed)
Forms have been faxed. Will you be the attending physician?

## 2020-12-13 NOTE — Telephone Encounter (Signed)
Spoke with matt. He has been made aware that Dr. [name] will be the attending physician.

## 2020-12-25 ENCOUNTER — Other Ambulatory Visit: Payer: Self-pay | Admitting: Family Medicine

## 2020-12-27 DIAGNOSIS — R32 Unspecified urinary incontinence: Secondary | ICD-10-CM | POA: Diagnosis not present

## 2020-12-27 DIAGNOSIS — Z933 Colostomy status: Secondary | ICD-10-CM | POA: Diagnosis not present

## 2021-01-08 ENCOUNTER — Telehealth: Payer: Self-pay | Admitting: Pharmacist

## 2021-01-08 NOTE — Chronic Care Management (AMB) (Signed)
I call with an appointment reminder for June the 1st 2022 at 10:00 PM. I spoke with the patient's wife who informed me that now he is in Hospice care. I informed the CPP.  Maia Breslow, [place] Pharmacist Assistant [phone]

## 2021-01-09 ENCOUNTER — Telehealth: Payer: PPO

## 2021-01-15 ENCOUNTER — Ambulatory Visit: Payer: PPO | Admitting: Oncology

## 2021-01-15 ENCOUNTER — Other Ambulatory Visit: Payer: PPO

## 2021-01-23 ENCOUNTER — Ambulatory Visit: Payer: PPO | Admitting: Oncology

## 2021-01-23 ENCOUNTER — Other Ambulatory Visit: Payer: PPO

## 2021-03-21 DIAGNOSIS — Z933 Colostomy status: Secondary | ICD-10-CM | POA: Diagnosis not present

## 2021-03-21 DIAGNOSIS — R32 Unspecified urinary incontinence: Secondary | ICD-10-CM | POA: Diagnosis not present

## 2021-04-24 ENCOUNTER — Other Ambulatory Visit: Payer: Self-pay | Admitting: Family Medicine

## 2021-04-24 NOTE — Telephone Encounter (Signed)
Last filled 11/30/2020 Last OV 11/05/2020  Ok to fill?

## 2021-05-28 DIAGNOSIS — Z933 Colostomy status: Secondary | ICD-10-CM | POA: Diagnosis not present

## 2021-06-03 ENCOUNTER — Other Ambulatory Visit: Payer: Self-pay | Admitting: Family Medicine

## 2021-06-03 IMAGING — DX DG CHEST 1V
1 series · 1 of 1 positions shown · non-contrast
Comparison: Chest radiograph October 17, 2019 and chest CT January 13, 2020

CLINICAL DATA: Cough with decreased breath sounds right base.
History of renal cell carcinoma

EXAM:
CHEST  1 VIEW

[chest ap]
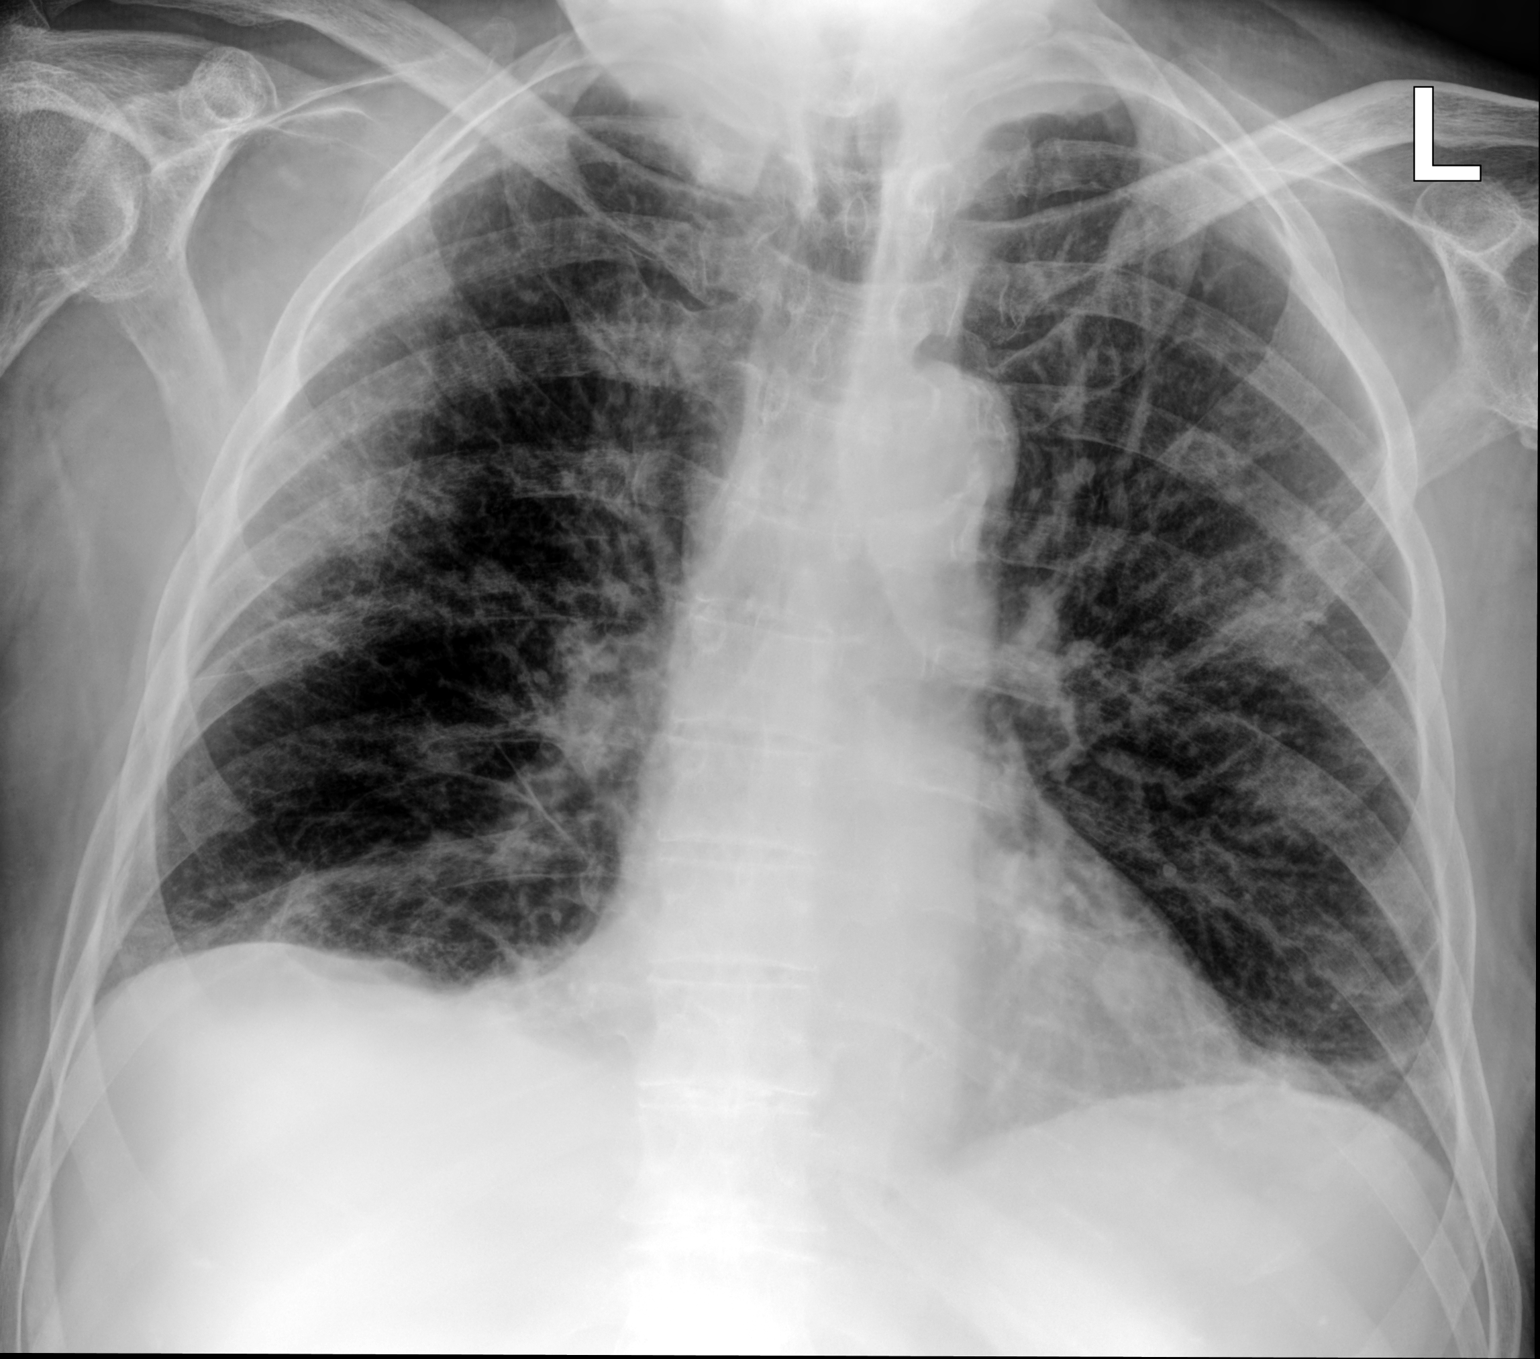

[1 of 1 positions shown; findings below may reference images not displayed]

FINDINGS: There is interstitial thickening bilaterally. Nodular opacities seen
on prior CT are less well delineated by radiography. There is
atelectatic change in the left base. There is no frank
consolidation. The heart size and pulmonary vascularity normal.
There is aortic atherosclerosis. No appreciable adenopathy. No bone
lesions.
IMPRESSION: Nodular opacities bilaterally less well seen compared to prior CT
examination. Diffuse interstitial thickening may represent a degree
of bronchitis. A degree of lymphangitic spread of carcinoma given
clinical history cannot be excluded by radiography. A degree of
noncardiogenic pulmonary edema also possible.

Heart size and pulmonary vascularity normal. No adenopathy. Aortic
Atherosclerosis ([id number]-MB7.7).

Given history of metastatic renal cell carcinoma, correlation with
contrast enhanced chest CT to further evaluate the interstitium may
be warranted.

These results will be called to the ordering clinician or
representative by the Radiologist Assistant, and communication
documented in the PACS or [REDACTED].

## 2021-06-17 DIAGNOSIS — R32 Unspecified urinary incontinence: Secondary | ICD-10-CM | POA: Diagnosis not present

## 2021-08-02 DIAGNOSIS — Z933 Colostomy status: Secondary | ICD-10-CM | POA: Diagnosis not present

## 2021-09-12 ENCOUNTER — Telehealth: Payer: Self-pay | Admitting: Family Medicine

## 2021-09-12 NOTE — Telephone Encounter (Signed)
Caryl Pina lpn with [facility] is calling to let md know pt wife straight cath this pt 4 times a day and wife noticed yesterday that pt urine was cloudy and dark yellow and today she  notice urine is still dark yellow. The pt is allergic to PCN . Caryl Pina would like to be notified if md call something into pharm . Caryl Pina is aware also md out of office today. Please advise [employer] [address] [place] - [address] Phone:  [phone]  Fax:  [phone]

## 2021-09-13 MED ORDER — CIPROFLOXACIN HCL 500 MG PO TABS: 500.0000 mg | ORAL_TABLET | Freq: Two times a day (BID) | ORAL | 0 refills | Status: AC

## 2021-09-13 NOTE — Telephone Encounter (Signed)
Rx sent in. Spoke with the patients wife. She is aware Rx was sent in.

## 2021-10-01 ENCOUNTER — Telehealth: Payer: Self-pay | Admitting: Family Medicine

## 2021-10-01 NOTE — Telephone Encounter (Signed)
Spoke with Andee Poles (she stated Caryl Pina is on maternity leave) and informed her of the orders as below.

## 2021-10-01 NOTE — Telephone Encounter (Signed)
Caryl Pina with [facility] called in stating that patient is currently on traZODone (DESYREL) 100 MG tablet [[id number]] and he stated that he has not been sleeping well and needs an increase.  Caryl Pina could be contacted at [phone].  Please advise.

## 2021-10-03 DIAGNOSIS — Z933 Colostomy status: Secondary | ICD-10-CM | POA: Diagnosis not present

## 2021-10-22 DIAGNOSIS — R32 Unspecified urinary incontinence: Secondary | ICD-10-CM | POA: Diagnosis not present

## 2021-11-05 ENCOUNTER — Other Ambulatory Visit: Payer: Self-pay | Admitting: Family Medicine

## 2021-11-12 ENCOUNTER — Other Ambulatory Visit: Payer: Self-pay | Admitting: Family Medicine

## 2021-11-20 ENCOUNTER — Ambulatory Visit (INDEPENDENT_AMBULATORY_CARE_PROVIDER_SITE_OTHER): Payer: PPO

## 2021-11-20 ENCOUNTER — Encounter: Payer: Self-pay | Admitting: Family Medicine

## 2021-11-20 ENCOUNTER — Ambulatory Visit (INDEPENDENT_AMBULATORY_CARE_PROVIDER_SITE_OTHER): Payer: PPO | Admitting: Family Medicine

## 2021-11-20 VITALS — BP 130/58 | HR 79 | Temp 98.9°F

## 2021-11-20 DIAGNOSIS — R051 Acute cough: Secondary | ICD-10-CM

## 2021-11-20 DIAGNOSIS — R918 Other nonspecific abnormal finding of lung field: Secondary | ICD-10-CM | POA: Diagnosis not present

## 2021-11-20 DIAGNOSIS — Z85528 Personal history of other malignant neoplasm of kidney: Secondary | ICD-10-CM | POA: Diagnosis not present

## 2021-11-20 DIAGNOSIS — R059 Cough, unspecified: Secondary | ICD-10-CM | POA: Diagnosis not present

## 2021-11-20 MED ORDER — DOXYCYCLINE HYCLATE 100 MG PO CAPS: 100.0000 mg | ORAL_CAPSULE | Freq: Two times a day (BID) | ORAL | 0 refills | Status: AC

## 2021-11-20 NOTE — Progress Notes (Signed)
? ?  Subjective:  ? ? Patient ID: Austin [name], male    DOB: 11/18/1955, 66 y.o.   MRN: [id number] ? ?HPI ?Here with his wife for 10 days of coughing which is non-productive. No chest pain or fever. The hospice nurse saw his oxygen sat drop to the low 80's last night and she said she heard "something" in his chest, so she suggested he been seen today. He has lung cancer with multiple areas of lung involvement so his respiratory reserve is low. He wears [place] oxygen at night. They also note that he has been swelling in the feet and legs for a few weeks. He had been taking Dexamethasone for awhile until this was stopped a week ago.  ? ? ?Review of Systems  ?Constitutional:  Positive for fatigue. Negative for chills, diaphoresis and fever.  ?Respiratory:  Positive for cough and shortness of breath.   ?Cardiovascular:  Positive for leg swelling. Negative for chest pain and palpitations.  ? ?   ?Objective:  ? Physical Exam ?Constitutional:   ?   Comments: In a motorized wheelchair, able to communicate by whispering   ?Cardiovascular:  ?   Rate and Rhythm: Normal rate and regular rhythm.  ?   Pulses: Normal pulses.  ?   Heart sounds: Normal heart sounds.  ?Pulmonary:  ?   Effort: Pulmonary effort is normal.  ?   Breath sounds: No wheezing or rales.  ?   Comments: There a few soft rhonchi  ?Neurological:  ?   Mental Status: He is alert.  ? ? ? ? ? ?   ?Assessment & Plan:  ?He likely has a bronchitis and we will treat this with 10 days of Doxycycline. We will get a CXR today as well. This could conceivably be the start of some CHF, but I do not think this the case. His leg swelling is likely the result of talking steroids. Recheck as needed. ?Alysia Penna, MD ? ? ?

## 2021-11-26 ENCOUNTER — Other Ambulatory Visit: Payer: Self-pay | Admitting: Family Medicine

## 2021-12-04 ENCOUNTER — Other Ambulatory Visit: Payer: Self-pay | Admitting: Family Medicine

## 2021-12-11 ENCOUNTER — Other Ambulatory Visit: Payer: Self-pay | Admitting: Family Medicine

## 2021-12-12 ENCOUNTER — Other Ambulatory Visit: Payer: Self-pay | Admitting: Family Medicine

## 2021-12-25 DIAGNOSIS — Z933 Colostomy status: Secondary | ICD-10-CM | POA: Diagnosis not present

## 2022-01-13 DIAGNOSIS — R32 Unspecified urinary incontinence: Secondary | ICD-10-CM | POA: Diagnosis not present

## 2022-03-05 DIAGNOSIS — Z933 Colostomy status: Secondary | ICD-10-CM | POA: Diagnosis not present

## 2022-04-11 DIAGNOSIS — R32 Unspecified urinary incontinence: Secondary | ICD-10-CM | POA: Diagnosis not present

## 2022-04-28 ENCOUNTER — Telehealth: Payer: Self-pay | Admitting: *Deleted

## 2022-04-28 NOTE — Patient Outreach (Signed)
  Care Coordination   04/28/2022 Name: Austin [name] MRN: [id number] DOB: 1955/10/06   Care Coordination Outreach Attempts:  An unsuccessful telephone outreach was attempted today to offer the patient information about available care coordination services as a benefit of their health plan.   Follow Up Plan:  Additional outreach attempts will be made to offer the patient care coordination information and services.   Encounter Outcome:  No Answer  Care Coordination Interventions Activated:  No   Care Coordination Interventions:  No, not indicated    Raina Mina, RN Care Management Coordinator [place] Office [phone]

## 2022-12-11 ENCOUNTER — Telehealth: Payer: Self-pay | Admitting: Family Medicine

## 2022-12-16 NOTE — Telephone Encounter (Signed)
Yes I can do that, but as usual I have no information about what happened. Where was he found? Was his death witnessed? Etc. Please call the family to get this info

## 2022-12-16 NOTE — Telephone Encounter (Signed)
Spoke with pt wife stated that pt was under Hospice care at home for about 2 years, states that pt passed peacefully in his bed on 12/11/22 at around 1.55 to 2 pm.

## 2022-12-16 NOTE — Telephone Encounter (Signed)
Pt spouse called to check on progress of someone signing the death certificate signed. 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

## 2022-12-17 NOTE — Telephone Encounter (Signed)
I understand. His death is probably related to the renal carcinoma. Have them send me the death certificate and I will complete it as soon as I can

## 2022-12-17 NOTE — Telephone Encounter (Signed)
I have completed the death certificate

## 2022-12-17 NOTE — Telephone Encounter (Signed)
Pt wife is aware that Dr Clent Ridges has signed the DC, verbalized understanding

## 2022-12-17 NOTE — Telephone Encounter (Signed)
Wife of Pt called to FU and asked when will DC be signed? Widow states she is already under enough stress. Please advise

## 2023-01-10 NOTE — Telephone Encounter (Signed)
Morrie Sheldon lpn with [facility] is calling to report pt has passed away today 12-15-2022 at 312pm

## 2023-01-10 DEATH — deceased
